# Patient Record
Sex: Female | Born: 1945 | Race: Black or African American | Hispanic: No | Marital: Married | Smoking: Former smoker
Health system: Southern US, Community
[De-identification: ages and names within clinical notes are randomized; demographics above are authoritative.]

## PROBLEM LIST (undated history)

## (undated) DIAGNOSIS — F17200 Nicotine dependence, unspecified, uncomplicated: Secondary | ICD-10-CM

## (undated) DIAGNOSIS — C801 Malignant (primary) neoplasm, unspecified: Secondary | ICD-10-CM

## (undated) DIAGNOSIS — J449 Chronic obstructive pulmonary disease, unspecified: Secondary | ICD-10-CM

## (undated) DIAGNOSIS — Z8669 Personal history of other diseases of the nervous system and sense organs: Secondary | ICD-10-CM

## (undated) DIAGNOSIS — F329 Major depressive disorder, single episode, unspecified: Secondary | ICD-10-CM

## (undated) DIAGNOSIS — R519 Headache, unspecified: Secondary | ICD-10-CM

## (undated) DIAGNOSIS — I714 Abdominal aortic aneurysm, without rupture, unspecified: Secondary | ICD-10-CM

## (undated) DIAGNOSIS — F32A Depression, unspecified: Secondary | ICD-10-CM

## (undated) DIAGNOSIS — I10 Essential (primary) hypertension: Secondary | ICD-10-CM

## (undated) DIAGNOSIS — G4730 Sleep apnea, unspecified: Secondary | ICD-10-CM

## (undated) HISTORY — DX: Nicotine dependence, unspecified, uncomplicated: F17.200

## (undated) HISTORY — PX: ABDOMINAL HYSTERECTOMY: SHX81

## (undated) HISTORY — DX: Abdominal aortic aneurysm, without rupture, unspecified: I71.40

## (undated) HISTORY — DX: Depression, unspecified: F32.A

## (undated) HISTORY — DX: Essential (primary) hypertension: I10

## (undated) HISTORY — DX: Personal history of other diseases of the nervous system and sense organs: Z86.69

## (undated) HISTORY — PX: CHOLECYSTECTOMY: SHX55

## (undated) HISTORY — DX: Abdominal aortic aneurysm, without rupture: I71.4

## (undated) HISTORY — DX: Major depressive disorder, single episode, unspecified: F32.9

## (undated) MED FILL — Dexamethasone Sodium Phosphate Inj 100 MG/10ML: INTRAMUSCULAR | Qty: 1 | Status: AC

---

## 1997-10-18 ENCOUNTER — Encounter: Admission: RE | Admit: 1997-10-18 | Discharge: 1997-10-18 | Payer: Self-pay | Admitting: Internal Medicine

## 1997-11-04 ENCOUNTER — Encounter: Admission: RE | Admit: 1997-11-04 | Discharge: 1997-11-04 | Payer: Self-pay | Admitting: Internal Medicine

## 1997-11-11 ENCOUNTER — Encounter: Admission: RE | Admit: 1997-11-11 | Discharge: 1997-11-11 | Payer: Self-pay | Admitting: Internal Medicine

## 1997-11-23 ENCOUNTER — Encounter: Admission: RE | Admit: 1997-11-23 | Discharge: 1997-11-23 | Payer: Self-pay | Admitting: Internal Medicine

## 1997-12-07 ENCOUNTER — Encounter: Admission: RE | Admit: 1997-12-07 | Discharge: 1997-12-07 | Payer: Self-pay | Admitting: Internal Medicine

## 1997-12-14 ENCOUNTER — Encounter: Admission: RE | Admit: 1997-12-14 | Discharge: 1997-12-14 | Payer: Self-pay | Admitting: Internal Medicine

## 1998-01-04 ENCOUNTER — Ambulatory Visit (HOSPITAL_COMMUNITY): Admission: RE | Admit: 1998-01-04 | Discharge: 1998-01-04 | Payer: Self-pay | Admitting: Internal Medicine

## 1998-02-24 ENCOUNTER — Encounter: Admission: RE | Admit: 1998-02-24 | Discharge: 1998-02-24 | Payer: Self-pay | Admitting: Internal Medicine

## 1998-03-30 ENCOUNTER — Encounter: Admission: RE | Admit: 1998-03-30 | Discharge: 1998-03-30 | Payer: Self-pay | Admitting: Internal Medicine

## 2000-04-21 ENCOUNTER — Emergency Department (HOSPITAL_COMMUNITY): Admission: EM | Admit: 2000-04-21 | Discharge: 2000-04-22 | Payer: Self-pay | Admitting: Emergency Medicine

## 2000-04-22 ENCOUNTER — Encounter: Payer: Self-pay | Admitting: Emergency Medicine

## 2000-05-20 ENCOUNTER — Encounter: Admission: RE | Admit: 2000-05-20 | Discharge: 2000-05-20 | Payer: Self-pay | Admitting: Internal Medicine

## 2000-05-27 ENCOUNTER — Ambulatory Visit (HOSPITAL_COMMUNITY): Admission: RE | Admit: 2000-05-27 | Discharge: 2000-05-27 | Payer: Self-pay | Admitting: Internal Medicine

## 2000-06-03 ENCOUNTER — Encounter: Admission: RE | Admit: 2000-06-03 | Discharge: 2000-06-03 | Payer: Self-pay | Admitting: Internal Medicine

## 2000-06-12 ENCOUNTER — Encounter: Admission: RE | Admit: 2000-06-12 | Discharge: 2000-07-02 | Payer: Self-pay | Admitting: Occupational Medicine

## 2001-01-28 ENCOUNTER — Encounter: Admission: RE | Admit: 2001-01-28 | Discharge: 2001-01-28 | Payer: Self-pay | Admitting: Internal Medicine

## 2001-03-03 ENCOUNTER — Encounter: Admission: RE | Admit: 2001-03-03 | Discharge: 2001-03-03 | Payer: Self-pay | Admitting: Internal Medicine

## 2001-08-12 ENCOUNTER — Encounter: Admission: RE | Admit: 2001-08-12 | Discharge: 2001-11-10 | Payer: Self-pay

## 2002-04-13 ENCOUNTER — Encounter: Admission: RE | Admit: 2002-04-13 | Discharge: 2002-04-13 | Payer: Self-pay | Admitting: Internal Medicine

## 2002-04-16 ENCOUNTER — Encounter: Payer: Self-pay | Admitting: Internal Medicine

## 2002-04-16 ENCOUNTER — Ambulatory Visit (HOSPITAL_COMMUNITY): Admission: RE | Admit: 2002-04-16 | Discharge: 2002-04-16 | Payer: Self-pay | Admitting: Internal Medicine

## 2002-04-23 ENCOUNTER — Encounter (INDEPENDENT_AMBULATORY_CARE_PROVIDER_SITE_OTHER): Payer: Self-pay | Admitting: Internal Medicine

## 2002-05-24 HISTORY — PX: ROTATOR CUFF REPAIR: SHX139

## 2002-08-03 ENCOUNTER — Encounter: Admission: RE | Admit: 2002-08-03 | Discharge: 2002-08-03 | Payer: Self-pay | Admitting: Internal Medicine

## 2003-02-11 ENCOUNTER — Encounter: Admission: RE | Admit: 2003-02-11 | Discharge: 2003-02-11 | Payer: Self-pay | Admitting: Internal Medicine

## 2003-09-14 ENCOUNTER — Ambulatory Visit (HOSPITAL_COMMUNITY): Admission: RE | Admit: 2003-09-14 | Discharge: 2003-09-14 | Payer: Self-pay | Admitting: Internal Medicine

## 2003-09-14 ENCOUNTER — Encounter: Admission: RE | Admit: 2003-09-14 | Discharge: 2003-09-14 | Payer: Self-pay | Admitting: Internal Medicine

## 2003-11-15 ENCOUNTER — Encounter: Admission: RE | Admit: 2003-11-15 | Discharge: 2003-11-15 | Payer: Self-pay | Admitting: Internal Medicine

## 2003-11-23 ENCOUNTER — Ambulatory Visit (HOSPITAL_COMMUNITY): Admission: RE | Admit: 2003-11-23 | Discharge: 2003-11-23 | Payer: Self-pay | Admitting: Internal Medicine

## 2004-08-27 ENCOUNTER — Emergency Department (HOSPITAL_COMMUNITY): Admission: EM | Admit: 2004-08-27 | Discharge: 2004-08-27 | Payer: Self-pay | Admitting: *Deleted

## 2004-08-30 ENCOUNTER — Emergency Department (HOSPITAL_COMMUNITY): Admission: EM | Admit: 2004-08-30 | Discharge: 2004-08-30 | Payer: Self-pay | Admitting: Emergency Medicine

## 2004-10-09 ENCOUNTER — Ambulatory Visit: Payer: Self-pay | Admitting: Internal Medicine

## 2004-10-22 ENCOUNTER — Ambulatory Visit: Payer: Self-pay | Admitting: Internal Medicine

## 2004-11-06 ENCOUNTER — Ambulatory Visit: Payer: Self-pay | Admitting: Internal Medicine

## 2004-12-11 ENCOUNTER — Ambulatory Visit: Payer: Self-pay | Admitting: Internal Medicine

## 2005-01-01 ENCOUNTER — Ambulatory Visit: Payer: Self-pay | Admitting: Internal Medicine

## 2005-01-31 ENCOUNTER — Ambulatory Visit: Payer: Self-pay | Admitting: Internal Medicine

## 2005-01-31 ENCOUNTER — Ambulatory Visit (HOSPITAL_COMMUNITY): Admission: RE | Admit: 2005-01-31 | Discharge: 2005-01-31 | Payer: Self-pay | Admitting: Internal Medicine

## 2005-03-04 ENCOUNTER — Emergency Department (HOSPITAL_COMMUNITY): Admission: EM | Admit: 2005-03-04 | Discharge: 2005-03-04 | Payer: Self-pay | Admitting: Emergency Medicine

## 2005-04-02 ENCOUNTER — Ambulatory Visit: Payer: Self-pay | Admitting: Internal Medicine

## 2005-10-08 ENCOUNTER — Ambulatory Visit (HOSPITAL_COMMUNITY): Admission: RE | Admit: 2005-10-08 | Discharge: 2005-10-08 | Payer: Self-pay | Admitting: Internal Medicine

## 2005-10-08 ENCOUNTER — Ambulatory Visit: Payer: Self-pay | Admitting: Internal Medicine

## 2005-11-29 ENCOUNTER — Ambulatory Visit: Payer: Self-pay | Admitting: Hospitalist

## 2006-02-19 ENCOUNTER — Ambulatory Visit (HOSPITAL_COMMUNITY): Admission: RE | Admit: 2006-02-19 | Discharge: 2006-02-19 | Payer: Self-pay | Admitting: Internal Medicine

## 2006-02-21 ENCOUNTER — Ambulatory Visit: Payer: Self-pay | Admitting: Internal Medicine

## 2006-02-21 LAB — CONVERTED CEMR LAB
BUN: 27 mg/dL — ABNORMAL HIGH (ref 6–23)
CO2: 24 meq/L (ref 19–32)
Calcium: 9.7 mg/dL (ref 8.4–10.5)
Chloride: 104 meq/L (ref 96–112)
Creatinine, Ser: 0.81 mg/dL (ref 0.40–1.20)
Glucose, Bld: 81 mg/dL (ref 70–99)
Magnesium: 1.9 mg/dL (ref 1.5–2.5)
Potassium: 4 meq/L (ref 3.5–5.3)
Sodium: 139 meq/L (ref 135–145)

## 2006-07-29 ENCOUNTER — Ambulatory Visit: Payer: Self-pay | Admitting: Internal Medicine

## 2006-07-29 ENCOUNTER — Encounter (INDEPENDENT_AMBULATORY_CARE_PROVIDER_SITE_OTHER): Payer: Self-pay | Admitting: Unknown Physician Specialty

## 2006-07-29 DIAGNOSIS — G5600 Carpal tunnel syndrome, unspecified upper limb: Secondary | ICD-10-CM | POA: Insufficient documentation

## 2006-07-29 DIAGNOSIS — E785 Hyperlipidemia, unspecified: Secondary | ICD-10-CM | POA: Insufficient documentation

## 2006-07-29 DIAGNOSIS — F329 Major depressive disorder, single episode, unspecified: Secondary | ICD-10-CM | POA: Insufficient documentation

## 2006-07-29 DIAGNOSIS — F17200 Nicotine dependence, unspecified, uncomplicated: Secondary | ICD-10-CM | POA: Insufficient documentation

## 2006-07-29 LAB — CONVERTED CEMR LAB
BUN: 23 mg/dL (ref 6–23)
CO2: 25 meq/L (ref 19–32)
Calcium: 9.7 mg/dL (ref 8.4–10.5)
Chloride: 104 meq/L (ref 96–112)
Creatinine, Ser: 0.86 mg/dL (ref 0.40–1.20)
Glucose, Bld: 82 mg/dL (ref 70–99)
Potassium: 3.7 meq/L (ref 3.5–5.3)
Sodium: 139 meq/L (ref 135–145)

## 2006-09-09 ENCOUNTER — Ambulatory Visit: Payer: Self-pay | Admitting: Internal Medicine

## 2006-09-09 DIAGNOSIS — M5412 Radiculopathy, cervical region: Secondary | ICD-10-CM | POA: Insufficient documentation

## 2006-09-09 DIAGNOSIS — Z9889 Other specified postprocedural states: Secondary | ICD-10-CM | POA: Insufficient documentation

## 2006-09-11 ENCOUNTER — Ambulatory Visit (HOSPITAL_COMMUNITY): Admission: RE | Admit: 2006-09-11 | Discharge: 2006-09-11 | Payer: Self-pay | Admitting: Internal Medicine

## 2006-09-25 ENCOUNTER — Encounter (INDEPENDENT_AMBULATORY_CARE_PROVIDER_SITE_OTHER): Payer: Self-pay | Admitting: Internal Medicine

## 2006-10-31 ENCOUNTER — Encounter (INDEPENDENT_AMBULATORY_CARE_PROVIDER_SITE_OTHER): Payer: Self-pay | Admitting: Internal Medicine

## 2006-12-16 ENCOUNTER — Encounter (INDEPENDENT_AMBULATORY_CARE_PROVIDER_SITE_OTHER): Payer: Self-pay | Admitting: Internal Medicine

## 2006-12-19 ENCOUNTER — Ambulatory Visit (HOSPITAL_COMMUNITY): Admission: RE | Admit: 2006-12-19 | Discharge: 2006-12-19 | Payer: Self-pay | Admitting: Neurosurgery

## 2006-12-20 ENCOUNTER — Emergency Department (HOSPITAL_COMMUNITY): Admission: EM | Admit: 2006-12-20 | Discharge: 2006-12-20 | Payer: Self-pay | Admitting: Emergency Medicine

## 2006-12-24 ENCOUNTER — Encounter (INDEPENDENT_AMBULATORY_CARE_PROVIDER_SITE_OTHER): Payer: Self-pay | Admitting: Internal Medicine

## 2007-01-02 ENCOUNTER — Inpatient Hospital Stay (HOSPITAL_COMMUNITY): Admission: RE | Admit: 2007-01-02 | Discharge: 2007-01-05 | Payer: Self-pay | Admitting: Neurosurgery

## 2007-02-17 ENCOUNTER — Ambulatory Visit: Payer: Self-pay | Admitting: Internal Medicine

## 2007-02-17 LAB — CONVERTED CEMR LAB: OCCULT 1: NEGATIVE

## 2007-02-18 LAB — CONVERTED CEMR LAB: OCCULT 3: NEGATIVE

## 2007-02-20 LAB — CONVERTED CEMR LAB: OCCULT 2: NEGATIVE

## 2007-02-23 ENCOUNTER — Ambulatory Visit: Payer: Self-pay | Admitting: Internal Medicine

## 2007-02-23 ENCOUNTER — Ambulatory Visit (HOSPITAL_COMMUNITY): Admission: RE | Admit: 2007-02-23 | Discharge: 2007-02-23 | Payer: Self-pay | Admitting: Internal Medicine

## 2007-03-12 ENCOUNTER — Encounter (INDEPENDENT_AMBULATORY_CARE_PROVIDER_SITE_OTHER): Payer: Self-pay | Admitting: Internal Medicine

## 2007-06-25 ENCOUNTER — Ambulatory Visit: Payer: Self-pay | Admitting: Internal Medicine

## 2007-06-25 DIAGNOSIS — K219 Gastro-esophageal reflux disease without esophagitis: Secondary | ICD-10-CM | POA: Insufficient documentation

## 2007-07-13 ENCOUNTER — Ambulatory Visit: Payer: Self-pay | Admitting: Internal Medicine

## 2007-07-13 ENCOUNTER — Telehealth: Payer: Self-pay | Admitting: *Deleted

## 2007-07-22 ENCOUNTER — Encounter (INDEPENDENT_AMBULATORY_CARE_PROVIDER_SITE_OTHER): Payer: Self-pay | Admitting: Internal Medicine

## 2008-02-25 ENCOUNTER — Ambulatory Visit (HOSPITAL_COMMUNITY): Admission: RE | Admit: 2008-02-25 | Discharge: 2008-02-25 | Payer: Self-pay | Admitting: Internal Medicine

## 2008-06-22 ENCOUNTER — Ambulatory Visit: Payer: Self-pay | Admitting: Internal Medicine

## 2008-06-22 ENCOUNTER — Encounter (INDEPENDENT_AMBULATORY_CARE_PROVIDER_SITE_OTHER): Payer: Self-pay | Admitting: Internal Medicine

## 2008-06-23 LAB — CONVERTED CEMR LAB
ALT: 10 units/L (ref 0–35)
AST: 13 units/L (ref 0–37)
Albumin: 4.3 g/dL (ref 3.5–5.2)
Alkaline Phosphatase: 81 units/L (ref 39–117)
BUN: 20 mg/dL (ref 6–23)
Basophils Absolute: 0 10*3/uL (ref 0.0–0.1)
Basophils Relative: 1 % (ref 0–1)
CO2: 24 meq/L (ref 19–32)
Calcium: 9.6 mg/dL (ref 8.4–10.5)
Chloride: 104 meq/L (ref 96–112)
Creatinine, Ser: 0.72 mg/dL (ref 0.40–1.20)
Eosinophils Absolute: 0.1 10*3/uL (ref 0.0–0.7)
Eosinophils Relative: 3 % (ref 0–5)
GFR calc Af Amer: >60 mL/min (ref >60)
GFR calc non Af Amer: >60 mL/min (ref >60)
Glucose, Bld: 79 mg/dL (ref 70–99)
HCT: 41.1 % (ref 36.0–46.0)
Hemoglobin: 13.8 g/dL (ref 12.0–15.0)
Lymphocytes Relative: 45 % (ref 12–46)
Lymphs Abs: 2.5 10*3/uL (ref 0.7–4.0)
MCHC: 33.6 g/dL (ref 30.0–36.0)
MCV: 92.6 fL (ref 78.0–100.0)
Monocytes Absolute: 0.4 10*3/uL (ref 0.1–1.0)
Monocytes Relative: 7 % (ref 3–12)
Neutro Abs: 2.6 10*3/uL (ref 1.7–7.7)
Neutrophils Relative %: 45 % (ref 43–77)
Platelets: 267 10*3/uL (ref 150–400)
Potassium: 3.8 meq/L (ref 3.5–5.3)
RBC: 4.44 M/uL (ref 3.87–5.11)
RDW: 14 % (ref 11.5–15.5)
Sodium: 140 meq/L (ref 135–145)
Total Bilirubin: 0.7 mg/dL (ref 0.3–1.2)
Total Protein: 7.5 g/dL (ref 6.0–8.3)
WBC: 5.7 10*3/uL (ref 4.0–10.5)

## 2008-10-10 ENCOUNTER — Encounter (INDEPENDENT_AMBULATORY_CARE_PROVIDER_SITE_OTHER): Payer: Self-pay | Admitting: Internal Medicine

## 2008-10-31 ENCOUNTER — Encounter (INDEPENDENT_AMBULATORY_CARE_PROVIDER_SITE_OTHER): Payer: Self-pay | Admitting: Internal Medicine

## 2008-11-04 ENCOUNTER — Emergency Department (HOSPITAL_COMMUNITY): Admission: EM | Admit: 2008-11-04 | Discharge: 2008-11-04 | Payer: Self-pay | Admitting: Family Medicine

## 2008-11-23 ENCOUNTER — Ambulatory Visit (HOSPITAL_COMMUNITY): Admission: RE | Admit: 2008-11-23 | Discharge: 2008-11-23 | Payer: Self-pay | Admitting: Internal Medicine

## 2008-11-23 ENCOUNTER — Ambulatory Visit: Payer: Self-pay | Admitting: Internal Medicine

## 2008-12-26 ENCOUNTER — Telehealth: Payer: Self-pay | Admitting: Internal Medicine

## 2009-02-13 ENCOUNTER — Encounter (INDEPENDENT_AMBULATORY_CARE_PROVIDER_SITE_OTHER): Payer: Self-pay | Admitting: Internal Medicine

## 2009-03-01 ENCOUNTER — Ambulatory Visit (HOSPITAL_COMMUNITY): Admission: RE | Admit: 2009-03-01 | Discharge: 2009-03-01 | Payer: Self-pay | Admitting: Internal Medicine

## 2009-03-01 LAB — HM MAMMOGRAPHY: HM Mammogram: NEGATIVE

## 2009-03-07 ENCOUNTER — Encounter (INDEPENDENT_AMBULATORY_CARE_PROVIDER_SITE_OTHER): Payer: Self-pay | Admitting: Internal Medicine

## 2009-05-24 ENCOUNTER — Ambulatory Visit: Payer: Self-pay | Admitting: Infectious Diseases

## 2009-05-24 ENCOUNTER — Ambulatory Visit (HOSPITAL_COMMUNITY): Admission: RE | Admit: 2009-05-24 | Discharge: 2009-05-24 | Payer: Self-pay | Admitting: Infectious Diseases

## 2009-05-24 LAB — CONVERTED CEMR LAB
BUN: 15 mg/dL (ref 6–23)
CO2: 26 meq/L (ref 19–32)
Calcium: 9.6 mg/dL (ref 8.4–10.5)
Chloride: 102 meq/L (ref 96–112)
Creatinine, Ser: 0.77 mg/dL (ref 0.40–1.20)
Glucose, Bld: 80 mg/dL (ref 70–99)
Potassium: 3.8 meq/L (ref 3.5–5.3)
Sodium: 138 meq/L (ref 135–145)

## 2009-07-03 ENCOUNTER — Ambulatory Visit: Payer: Self-pay | Admitting: Internal Medicine

## 2009-09-04 ENCOUNTER — Emergency Department (HOSPITAL_COMMUNITY): Admission: EM | Admit: 2009-09-04 | Discharge: 2009-09-04 | Payer: Self-pay | Admitting: Emergency Medicine

## 2010-01-17 ENCOUNTER — Ambulatory Visit: Payer: Self-pay | Admitting: Internal Medicine

## 2010-01-17 DIAGNOSIS — K5900 Constipation, unspecified: Secondary | ICD-10-CM | POA: Insufficient documentation

## 2010-01-18 DIAGNOSIS — I10 Essential (primary) hypertension: Secondary | ICD-10-CM | POA: Insufficient documentation

## 2010-01-18 LAB — CONVERTED CEMR LAB
ALT: 9 units/L (ref 0–35)
AST: 14 units/L (ref 0–37)
Albumin: 4.3 g/dL (ref 3.5–5.2)
Alkaline Phosphatase: 66 units/L (ref 39–117)
BUN: 17 mg/dL (ref 6–23)
CO2: 25 meq/L (ref 19–32)
Calcium: 9.4 mg/dL (ref 8.4–10.5)
Chloride: 104 meq/L (ref 96–112)
Cholesterol: 255 mg/dL — ABNORMAL HIGH (ref 0–200)
Creatinine, Ser: 0.75 mg/dL (ref 0.40–1.20)
Glucose, Bld: 83 mg/dL (ref 70–99)
HDL: 63 mg/dL (ref >39)
LDL Cholesterol: 174 mg/dL — ABNORMAL HIGH (ref 0–99)
Potassium: 3.8 meq/L (ref 3.5–5.3)
Sodium: 141 meq/L (ref 135–145)
TSH: 1.448 microintl units/mL (ref 0.350–4.5)
Total Bilirubin: 0.8 mg/dL (ref 0.3–1.2)
Total CHOL/HDL Ratio: 4
Total Protein: 7.1 g/dL (ref 6.0–8.3)
Triglycerides: 91 mg/dL (ref <150)
VLDL: 18 mg/dL (ref 0–40)

## 2010-03-02 ENCOUNTER — Ambulatory Visit (HOSPITAL_COMMUNITY)
Admission: RE | Admit: 2010-03-02 | Discharge: 2010-03-02 | Payer: Self-pay | Source: Home / Self Care | Attending: Internal Medicine | Admitting: Internal Medicine

## 2010-04-15 ENCOUNTER — Encounter: Payer: Self-pay | Admitting: Internal Medicine

## 2010-04-24 NOTE — Assessment & Plan Note (Signed)
Summary: EST-ROUTINE CHECKUP/CH   Vital Signs:  Patient profile:   65 year old female Height:      62 inches (157.48 cm) Weight:      159.3 pounds (72.41 kg) BMI:     29.24 Temp:     98.2 degrees F oral Pulse rate:   84 / minute BP sitting:   163 / 94  (right arm)  Vitals Entered By: Morrison Old RN (May 24, 2009 2:39 PM)  Serial Vital Signs/Assessments:  Time      Position  BP       Pulse  Resp  Temp     By 3:00 PM             152/97                         Morrison Old RN  CC: Check-up..Pain in left breast x 2 months., Depression Is Patient Diabetic? No Pain Assessment Patient in pain? yes     Location: left breast Intensity: 8 Type: aching Onset of pain  Intermittent Nutritional Status BMI of 25 - 29 = overweight  Have you ever been in a relationship where you felt threatened, hurt or afraid?No   Does patient need assistance? Functional Status Self care Ambulation Normal   Primary Care Provider:  Izora Gala Phifer  CC:  Check-up..Pain in left breast x 2 months. and Depression.  History of Present Illness: Debra Rodgers is a 65 yo F with PMH of HTN and cervical radiculopathy who presents for regularly scheduled follow-up. She says her L breast hurts at the chest wall, which hurts when she moves in certain positions and is similar in quality to the chest pain she had back in September 2010 which was diagnosed as MSK pain. She denies any nipple discharge, and her last mammogram was in 12/10 and was normal. She has that chest pain occasionally as well. She also reports cough x 2 months that is productive of clear sputum and is worst at night and early in the morning. Her nose is frequently "stopped up" and she reports occasional "night sweats" x 1 year (described mainly as "I get hot and then throw my covers off", denies being drenched in sweat).   Depression History:      The patient denies a depressed mood most of the day and a diminished interest in her usual daily  activities.        Comments:  "From time to time it sucks.".   Preventive Screening-Counseling & Management  Alcohol-Tobacco     Alcohol drinks/day: 0     Smoking Status: current     Smoking Cessation Counseling: yes     Packs/Day: 1/2     Year Started: about 30 yrs.  Caffeine-Diet-Exercise     Caffeine use/day: 2     Does Patient Exercise: no  Current Medications (verified): 1)  Aspir-Low 81 Mg Tbec (Aspirin) .... Take 1 Tablet By Mouth Once A Day 2)  Prozac 20 Mg Caps (Fluoxetine Hcl) .... Take 1 Tablet By Mouth Once A Day 3)  Dyazide 37.5-25 Mg Caps (Triamterene-Hctz) .... Take 1 Tablet By Mouth Once A Day 4)  Klor-Con 20 Meq Pack (Potassium Chloride) .... Take 1 Tablet By Mouth Once A Day 5)  Flexeril 5 Mg Tabs (Cyclobenzaprine Hcl) .... Take 1 Tablet Every 8 Hours As Needed For Muscle Pain 6)  Naprosyn 500 Mg Tabs (Naproxen) .... Take 1 Pill Twice Daily As Needed For Pain.  7)  Nasonex 50 Mcg/act Susp (Mometasone Furoate) .... 2 Sprays in Each Nostril Daily 8)  Nicotine 21 Mg/24hr Pt24 (Nicotine) .... Apply To Skin and Remove Every 24 Hours For 2 Weeks 9)  Nicotine 14 Mg/24hr Pt24 (Nicotine) .... Apply To Skin and Remove Every 24 Hours For 2 Weeks After You Have Finished The 2 Weeks of The 7 Mg Patches 10)  Nicotine 7 Mg/24hr Pt24 (Nicotine) .... Appy To Skin and Remove Every 24 Hours After You Finish The 2 Weeks of 14 Mg Patches. Continue Using This Dose For 8 Weeks, Then Stop.  Allergies (verified): 1)  ! Codeine Sulfate (Codeine Sulfate) 2)  ! Protonix (Pantoprazole Sodium)  Review of Systems      See HPI  Physical Exam  General:  Well-developed,well-nourished,in no acute distress; alert,appropriate and cooperative throughout examination Head:  Normocephalic and atraumatic without obvious abnormalities. No apparent alopecia or balding. Chest Wall:  tender to palpation at chest wall near L breast, reproduces pain Breasts:  no masses or discharge in L breast Lungs:   Normal respiratory effort, chest expands symmetrically. Lungs are clear to auscultation, no crackles or wheezes. Heart:  Normal rate and regular rhythm. S1 and S2 normal without gallop, murmur, click, rub or other extra sounds. Neurologic:  alert & oriented X3.   Psych:  Cognition and judgment appear intact. Alert and cooperative with normal attention span and concentration. No apparent delusions, illusions, hallucinations   Impression & Recommendations:  Problem # 1:  COUGH, CHRONIC (ICD-786.2) Her cough is likely the result of post-nasal drip given that she feels congested and her cough is worst at night and in the morning after she has been lying down. GERD is also possible, though her cough is productive. She has not lost any weight, which is reassuring. However, given her history of smoking, history of positive PPD, and her report of occasional "night sweats", I will check a chest x-ray. I have prescribed Nasonex and told the patient to take Claritin during the day which, if her cough is related to her nasal congestion, it will hopefully improve as a result. If it does not help, may need to try a trial of PPI.   Problem # 2:  CHEST WALL PAIN, HX OF (ICD-V15.89) Pain in chest wall near L breast is consistent with musculoskeletal etiology. I told the patient to take Aleve or naproxen scheduled twice daily x 10-14 days. I also wrote her a prescription for Flexeril. I expect her pain will improve if she takes the NSAID scheduled instead of only as needed for a short time.   Problem # 3:  Hx of TOBACCO ABUSE (ICD-305.1) Ms. Gilleland smokes 1/2 PPD but is interested in trying the patches, so I wrote her a prescription. Will follow-up with her in 1 month.  Her updated medication list for this problem includes:    Nicotine 21 Mg/24hr Pt24 (Nicotine) .Marland Kitchen... Apply to skin and remove every 24 hours for 2 weeks    Nicotine 14 Mg/24hr Pt24 (Nicotine) .Marland Kitchen... Apply to skin and remove every 24 hours for 2  weeks after you have finished the 2 weeks of the 7 mg patches    Nicotine 7 Mg/24hr Pt24 (Nicotine) .Marland Kitchen... Appy to skin and remove every 24 hours after you finish the 2 weeks of 14 mg patches. continue using this dose for 8 weeks, then stop.  Problem # 4:  Hx of ESSENTIAL HYPERTENSION (ICD-401.9) Pt's BP was elevated today (even repeat BP was 154/90) but all her previous  BP measurements were at goal. For now, I told her to restrict her salt intake and to keep a blood pressure log. She should take her BP every day and record it, and she should bring it in with her at her next visit. I will follow-up with her in 3-4 weeks.  Her updated medication list for this problem includes:    Dyazide 37.5-25 Mg Caps (Triamterene-hctz) .Marland Kitchen... Take 1 tablet by mouth once a day  Complete Medication List: 1)  Aspir-low 81 Mg Tbec (Aspirin) .... Take 1 tablet by mouth once a day 2)  Prozac 20 Mg Caps (Fluoxetine hcl) .... Take 1 tablet by mouth once a day 3)  Dyazide 37.5-25 Mg Caps (Triamterene-hctz) .... Take 1 tablet by mouth once a day 4)  Klor-con 20 Meq Pack (Potassium chloride) .... Take 1 tablet by mouth once a day 5)  Flexeril 5 Mg Tabs (Cyclobenzaprine hcl) .... Take 1 tablet every 8 hours as needed for muscle pain 6)  Naprosyn 500 Mg Tabs (Naproxen) .... Take 1 pill twice daily as needed for pain. 7)  Nasonex 50 Mcg/act Susp (Mometasone furoate) .... 2 sprays in each nostril daily 8)  Nicotine 21 Mg/24hr Pt24 (Nicotine) .... Apply to skin and remove every 24 hours for 2 weeks 9)  Nicotine 14 Mg/24hr Pt24 (Nicotine) .... Apply to skin and remove every 24 hours for 2 weeks after you have finished the 2 weeks of the 7 mg patches 10)  Nicotine 7 Mg/24hr Pt24 (Nicotine) .... Appy to skin and remove every 24 hours after you finish the 2 weeks of 14 mg patches. continue using this dose for 8 weeks, then stop.  Other Orders: T-Basic Metabolic Panel ([id number]) CXR- 2view (CXR)  Patient Instructions: 1)   Please schedule a follow-up appointment with Dr. Vinnie Langton in 3-4 weeks. 2)  For your chest pain, which I believe is muscular in origin, please take naproxen or Aleve twice daily for 10-14 days. Take it even if you are not having any pain. After this period, only take it or Tylenol as needed. 3)  I have also prescribed a medication called Flexeril, which helps with muscle spasms. Only take it as needed. It can be sedating, so don't drive or operate heavy machinery after taking it. 4)  For your cough, which I believe is the result of post-nasal drip, I have prescribed for you a spray that you spray twice into each nostril every day. I would also suggest taking Claritin during the day, but NOT Claritin D.  5)  I have also prescribed for you nicotine patches. Please use them as directed and call the clinic with any questions. Prescriptions: NICOTINE 7 MG/24HR PT24 (NICOTINE) appy to skin and remove every 24 hours after you finish the 2 weeks of 14 mg patches. Continue using this dose for 8 weeks, then stop.  #14 patches x 0   Entered and Authorized by:   Neta Mends MD   Signed by:   Neta Mends MD on 05/24/2009   Method used:   [facility] to        [facility] [address] [phone]* (retail)       [address]       [place],   [address]       Ph: [phone] or [phone]       Fax: [phone]   RxID:   [id number] NICOTINE 14 MG/24HR PT24 (NICOTINE) apply to skin and remove every 24  hours for 2 weeks after you have finished the 2 weeks of the 7 mg patches  #14 patches x 0   Entered and Authorized by:   Neta Mends MD   Signed by:   Neta Mends MD on 05/24/2009   Method used:   [facility]ron[facility]        [facility] [address] [phone]* (retail)       [address]       [place], [place]  [address]       Ph: [phone] or [phone]       Fax: [phone]   RxID:   [id number] NICOTINE 21 MG/24HR PT24 (NICOTINE) apply to  skin and remove every 24 hours for 2 weeks  #14 patches x 3   Entered and Authorized by:   Neta Mends MD   Signed by:   Neta Mends MD on 05/24/2009   Method used:   [facility]ronically to        [facility] [address] [phone]* (retail)       [address]       [place], [place]  [address]       Ph: [phone] or [phone]       Fax: [phone]   RxID:   [id number] NASONEX 50 MCG/ACT SUSP (MOMETASONE FUROATE) 2 sprays in each nostril daily  #1 mo supply x 2   Entered and Authorized by:   Neta Mends MD   Signed by:   Neta Mends MD on 05/24/2009   Method used:   Electronically to        [facility] [address] [phone]* (retail)       [address]       [place], [place]  [address]       Ph: [phone] or [phone]       Fax: [phone]   RxID:   [id number] FLEXERIL 5 MG TABS (CYCLOBENZAPRINE HCL) take 1 tablet every 8 hours as needed for muscle pain  #30 x 0   Entered and Authorized by:   Neta Mends MD   Signed by:   Neta Mends MD on 05/24/2009   Method used:   Electronically to        [facility] [address] [phone]* (retail)       [address]       [place], [place]  [address]       Ph: [phone] or [phone]       Fax: [phone]   RxID:   [id number] DYAZIDE 37.5-25 MG CAPS (TRIAMTERENE-HCTZ) Take 1 tablet by mouth once a day  #30 Capsule x 6   Entered and Authorized by:   Neta Mends MD   Signed by:   Neta Mends MD on 05/24/2009   Method used:   Electronically to        [facility] [address] [phone]* (retail)       [address]       [place], [place]  [address]       Ph: [phone] or [phone]       Fax: [phone]   RxID:   [id number] PROZAC 20 MG CAPS (FLUOXETINE HCL) Take 1 tablet by mouth once a day  #30 Capsule x 6   Entered and Authorized by:  Neta Mends MD   Signed by:   Neta Mends MD on 05/24/2009   Method  used:   Electronically to        [facility] [address] [phone]* (retail)       [address]       [place], [place]  [address]       Ph: [phone] or [phone]       Fax: [phone]   RxID:   [id number]   Prevention & Chronic Care Immunizations   Influenza vaccine: Not documented    Tetanus booster: Not documented    Pneumococcal vaccine: Not documented    H. zoster vaccine: Not documented  Colorectal Screening   Hemoccult: Not documented    Colonoscopy: Results: Polyps - 2 tubulovillous adenomas, 4 tubular adenomas, no high grade dysplasia (total 6 polyps).  Results: Hemorrhoids.  Results: Diverticulosis.   Location:  [facility]).     (10/17/2008)   Colonoscopy action/deferral: Repeat colonoscopy in 2 years.    (10/17/2008)   Colonoscopy due: 10/2010  Other Screening   Pap smear: Not documented    Mammogram: normal  (03/07/2009)   Mammogram due: 02/2010    DXA bone density scan: Not documented   Smoking status: current  (05/24/2009)   Smoking cessation counseling: yes  (05/24/2009)  Lipids   Total Cholesterol: Not documented   LDL: Not documented   LDL Direct: Not documented   HDL: Not documented   Triglycerides: Not documented    SGOT (AST): 13  (06/22/2008)   SGPT (ALT): 10  (06/22/2008)   Alkaline phosphatase: 81  (06/22/2008)   Total bilirubin: 0.7  (06/22/2008)  Hypertension   Last Blood Pressure: 163 / 94  (05/24/2009)   Serum creatinine: 0.72  (06/22/2008)   Serum potassium 3.8  (06/22/2008)  Self-Management Support :   Personal Goals (by the next clinic visit) :      Personal blood pressure goal: 140/90  (05/24/2009)     Personal LDL goal: 100  (05/24/2009)    Patient will work on the following items until the next clinic visit to reach self-care goals:     Medications and monitoring: check my blood sugar, bring all of my medications to every visit  (05/24/2009)     Eating: eat more  vegetables, use fresh or frozen vegetables, eat foods that are low in salt, eat baked foods instead of fried foods  (05/24/2009)     Activity: take a 30 minute walk every day  (05/24/2009)    Hypertension self-management support: Education handout, Resources for patients handout, Written self-care plan  (05/24/2009)   Hypertension self-care plan printed.   Hypertension education handout printed    Lipid self-management support: Education handout, Resources for patients handout, Written self-care plan  (05/24/2009)   Lipid self-care plan printed.   Lipid education handout printed      Resource handout printed.  Process Orders Check Orders Results:     [facility]: MGN not required for this insurance Tests Sent for requisitioning (May 24, 2009 3:48 PM):     05/24/2009: [facility] -- T-Basic Metabolic Panel [[id number]] (signed)

## 2010-04-24 NOTE — Consult Note (Signed)
Summary: [facility]: Dr[facility]: Dr. Joya Salm   Imported By: Bonner Puna 01/01/2007 11:42:34  _____________________________________________________________________  External Attachment:    Type:   Image     Comment:   External Document

## 2010-04-24 NOTE — Progress Notes (Signed)
Summary: Refill/gh  Phone Note Refill Request Message from:  Fax from Pharmacy on December 26, 2008 9:36 AM  Refills Requested: Medication #1:  DYAZIDE 37.5-25 MG CAPS Take 1 tablet by mouth once a day   Last Refilled: 11/20/2008  Medication #2:  PROZAC 20 MG CAPS Take 1 tablet by mouth once a day   Last Refilled: 11/20/2008  Method Requested: Electronic Initial call taken by: Sander Nephew RN,  December 26, 2008 9:37 AM  Follow-up for Phone Call        Refilled electronically.  Follow-up by: Bertha Stakes MD,  December 26, 2008 10:17 AM    Prescriptions: PROZAC 20 MG CAPS (FLUOXETINE HCL) Take 1 tablet by mouth once a day  #30 Capsule x 3   Entered and Authorized by:   Bertha Stakes MD   Signed by:   Bertha Stakes MD on 12/26/2008   Method used:   [facility]ron[facility] to        [facility] [address] [phone]* (retail)       [address]       [place], [place]  [address]       Ph: [phone] or [phone]       Fax: [phone]   RxID:   [id number] DYAZIDE 37.5-25 MG CAPS (TRIAMTERENE-HCTZ) Take 1 tablet by mouth once a day  #30 Capsule x 3   Entered and Authorized by:   Bertha Stakes MD   Signed by:   Bertha Stakes MD on 12/26/2008   Method used:   Electronically to        [facility] [address] [phone]* (retail)       [address]       [place], [place]  [address]       Ph: [phone] or [phone]       Fax: [phone]   RxID:   [id number]

## 2010-04-24 NOTE — Miscellaneous (Signed)
  Clinical Lists Changes  Observations: Added new observation of MAMMO DUE: 02/2008 (07/22/2007 14:52) Added new observation of MAMMOGRAM: Assessment: BIRADS 1.  (02/23/2007 14:53)       Mammogram  Procedure date:  02/23/2007  Findings:      Assessment: BIRADS 1.   Procedures Next Due Date:    Mammogram: 02/2008

## 2010-04-24 NOTE — Assessment & Plan Note (Signed)
Summary: ACUTE-PAIN ON LEFT SIDE TICK BITE NOT FEELING RIGHT/(PHIFER)/CFB   Vital Signs:  Patient Profile:   65 Years Old Female Weight:      160.6 pounds (73.00 kg) Temp:     98.8 degrees F (37.11 degrees C) oral Pulse rate:   72 / minute BP sitting:   148 / 88  (right arm)  Pt. in pain?   yes    Location:   left side flack    Intensity:   8  Vitals Entered By: Nadine Counts Deborra Medina) (Jul 29, 2006 2:45 PM)              Is Patient Diabetic? No Nutritional Status Obese  Have you ever been in a relationship where you felt threatened, hurt or afraid?No   Does patient need assistance? Functional Status Self care Ambulation Normal   PCP:  Izora Gala Phifer  Chief Complaint:  pt c/o left side body pain x7mh, had tick removed one week ago, hand, legs, and and feet tingling.  History of Present Illness: Pt is a pt of Dr. PErmalinda Memoswith h/o HTN, HL(no treatment), ongoing tobacco use and ongoing c/o ingling and numbness in her extrimities, more so in Upper vs Lower. She says it has gotten worse over the last mth. She says  she burns her fingers occ from holding very hot things and does not realize it, more pronounced on lt side. She also has been trouble holding things, drops forks and spoons, button/unbuttoning her clothes due to lack of sensation. She has not noticed any acute focal weakness involving her extrimities like falling down, inability to lift hands above shoulder, HA, Vn changes, palpitation, CP. She does c/o Lt shoulder pain and occ difficulty moving that arm. She has had a Rotator cuff tear repair on that side in 2004 by Dr. SOnnie Graham She says that she is doing well on Prazac in terms of depression. She c/o pain in hr lt breast for 1 mth, dull aching, no change since onset, not noticed any mass. She has been stressed out because of the ambiguity and would like to figure out whats going on.  Current Allergies (reviewed today): ! CODEINE SULFATE (CODEINE SULFATE)  Past Medical  History:    F/H/O Breast Ca in sister at age of 450s    Depression    H/O + PPD in 6/94, neg CXR in 1994, 1997 and 2005    H/O Migraine  Past Surgical History:    Cholecystectomy    Hysterectomy 9/93 for Fibroids    Rotator cuff repair - 3/04, Dr. SOnnie Graham      Family History:    Sisters - DM, HTN    Father - CHF    Mother - HTN, CVA and CAD in 655s Social History:    Occupation:CNA    Married    Current Smoker - 1/2 ppd for 30+yrs    Alcohol use-no    Regular exercise-yes   Risk Factors:  Tobacco use:  current    Cigarettes:  Yes -- 1/2 pack(s) per day    Counseled to quit/cut down tobacco use:  yes Alcohol use:  no Exercise:  yes    Physical Exam  General:     Well-developed,well-nourished,in no acute distress; alert,appropriate and cooperative throughout examination Head:     Normocephalic and atraumatic without obvious abnormalities. No apparent alopecia or balding. Eyes:     No corneal or conjunctival inflammation noted. EOMI. Perrla. Funduscopic exam benign, without hemorrhages, exudates or papilledema. Vision  grossly normal. Mouth:     Oral mucosa and oropharynx without lesions or exudates.  Teeth in good repair. Neck:     No deformities, masses, or tenderness noted. Breasts:     Lt larger than Rt, no lumps or cysts palpated. sl tender lt side. No discharge through nipple Lungs:     Normal respiratory effort, chest expands symmetrically. Lungs are clear to auscultation, no crackles or wheezes. Heart:     Normal rate and regular rhythm. S1 and S2 normal without gallop, murmur, click, rub or other extra sounds. Abdomen:     Bowel sounds positive,abdomen soft and non-tender without masses, organomegaly or hernias noted. Msk:     Abduction restricted Lt side beyond 150 degrees, rest normal. Tinnels and Phalen's sign positive on Lt Pulses:     R and L carotid,radial,femoral,dorsalis pedis and posterior tibial pulses are full and equal bilaterally  Neurologic:     Non focal See above    Impression & Recommendations:  Problem # 1:  Hx of SYNDROME, CARPAL TUNNEL (ICD-354.0) Pt's symptoms in her hands most likely are sec to Carpal tunnel syndrome. She has had a w/u for peripheral neuropathy in terms of HIV, TSH, RPR and Vit B12 level and also rpt BMETs, which have all been neg to support the cause. Treating what we have, I rec that she get a release atleast on Lt side which has most dominant symptoms and if she feels better following the surgery she could consider for rt side. She agrees to above, but would like to discuss with Dr. Ermalinda Memos, her PCP. Also meanwhile she will ponder about the Orthopedist of her choice.I will give her Darvocet for pain till she sees Dr. Ermalinda Memos and come up with a plan.    Problem # 2:  Hx of ESSENTIAL HYPERTENSION (ICD-401.9) Looking back at her recs, it seems it has been well controlled on the following med. today is the first time when it is elevated to 150/90 (manually). Since she is going to be back to see Dr. Ermalinda Memos soon, I feel comfortable rechecking it and then adding any new medication to her regimen. Since she is on Potassium supplements, I shall check a BMET today Her updated medication list for this problem includes:    Dyazide 37.5-25 Mg Caps (Triamterene-hctz) .Marland Kitchen... Take 1 tablet by mouth once a day  Orders: T-Basic Metabolic Panel ([id number])  ADD: BMET normal.   Problem # 3:  Hx of DISORDER, DEPRESSIVE NEC (ICD-311) She seems a little tired to me. But says has been stressed out because of all ongoing probs. Again this could be adding to her elevated BP. Her updated medication list for this problem includes:    Prozac 20 Mg Caps (Fluoxetine hcl) .Marland Kitchen... 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 She had a normal Mammogram in 11/07. She does have a family h/o Breast cancer, but nothing at this time warrants another imaging study or  FNAC. Have asked her to use heating pads.  Problem # 5:  Hx of TOBACCO ABUSE (ICD-305.1) Pt would like to quit smoking, but lost hte script for Chantix from Dr. Ermalinda Memos. Will go ahead and give her one. Her updated medication list for this problem includes:    Chantix Starting Month Pak 0.5 Mg X 11 & 1 Mg X 42 Misc (Varenicline tartrate) .Marland Kitchen... Take as advised.   Medications Added to Medication List This Visit: 1)  Aspir-low 81 Mg Tbec (Aspirin) .... Take 1 tablet by mouth once a day 2)  Prozac 20 Mg Caps (Fluoxetine hcl) .... Take 1 tablet by mouth once a day 3)  Dyazide 37.5-25 Mg Caps (Triamterene-hctz) .... Take 1 tablet by mouth once a day 4)  Klor-con 20 Meq Pack (Potassium chloride) .... Take 1 tablet by mouth once a day 5)  Darvocet-n 100 100-650 Mg Tabs (Propoxyphene n-apap) .... Take one tab every 6 hrs as required 6)  Chantix Starting Month Pak 0.5 Mg X 11 & 1 Mg X 42 Misc (Varenicline tartrate) .... Take as advised.   Patient Instructions: 1)  F/U apt with Dr. Ermalinda Memos, the earliest available. 2)  Limit your Sodium (Salt). 3)  Tobacco is very bad for your health and your loved ones! You Should stop smoking!. Prescriptions: DARVOCET-N 100 100-650 MG TABS (PROPOXYPHENE N-APAP) take one tab every 6 hrs as required  #20 x 1   Entered and Authorized by:   Caprice Kluver MD   Signed by:   Caprice Kluver MD on 07/29/2006   Method used:   Print then Give to Patient   RxID:   [id number]

## 2010-04-24 NOTE — Progress Notes (Signed)
Summary: appt for med reaction/ hla  Phone Note Call from Patient   Reason for Call: Acute Illness Summary of Call: pt called to c/o poss reaction to protonix...facial swelling and rash.. told her to see dr asap Initial call taken by: Freddy Finner RN,  July 13, 2007 2:48 PM

## 2010-04-24 NOTE — Consult Note (Signed)
Summary: [facility]:Dr. Bob[facility]:Dr. Joya Salm   Imported By: Bonner Puna 07/27/2007 15:40:17  _____________________________________________________________________  External Attachment:    Type:   Image     Comment:   External Document

## 2010-04-24 NOTE — Consult Note (Signed)
Summary: [facility]   Imported By: Bonner Puna 03/16/2009 14:04:31  _____________________________________________________________________  External Attachment:    Type:   Image     Comment:   External Document

## 2010-04-24 NOTE — Miscellaneous (Signed)
  Clinical Lists Changes  Observations: Added new observation of COLONNXTDUE: 10/2010 (10/31/2008 9:37) Added new observation of COLONRECACT: Repeat colonoscopy in 2 years.   (10/17/2008 9:42) Added new observation of COLONOSCOPY: Results: Polyps - 2 tubulovillous adenomas, 4 tubular adenomas, no high grade dysplasia (total 6 polyps).  Results: Hemorrhoids.  Results: Diverticulosis.   Location:  [facility]).    (10/17/2008 9:42)      Colonoscopy  Procedure date:  10/17/2008  Findings:      Results: Polyps - 2 tubulovillous adenomas, 4 tubular adenomas, no high grade dysplasia (total 6 polyps).  Results: Hemorrhoids.  Results: Diverticulosis.   Location:  [facility]).     Comments:      Repeat colonoscopy in 2 years.    Procedures Next Due Date:    Colonoscopy: 10/2010   Colonoscopy  Procedure date:  10/17/2008  Findings:      Results: Polyps - 2 tubulovillous adenomas, 4 tubular adenomas, no high grade dysplasia (total 6 polyps).  Results: Hemorrhoids.  Results: Diverticulosis.   Location:  [facility]).     Comments:      Repeat colonoscopy in 2 years.    Procedures Next Due Date:    Colonoscopy: 10/2010

## 2010-04-24 NOTE — Assessment & Plan Note (Signed)
Summary: Debra Rodgers   Vital Signs:  Patient profile:   65 year old female Height:      62 inches (157.48 cm) Weight:      164.01 pounds (74.55 kg) BMI:     30.11 Temp:     97.7 degrees F (36.50 degrees C) oral Pulse rate:   73 / minute BP sitting:   147 / 90  (left arm)  Vitals Entered By: Sander Nephew RN (June 22, 2008 9:31 AM) Is Patient Diabetic? No Pain Assessment Patient in pain? yes     Location: back Intensity: 8 Type: aching Onset of pain  Intermittent Nutritional Status BMI of > 30 = obese  Have you ever been in a relationship where you felt threatened, hurt or afraid?No   Does patient need assistance? Functional Status Self care Ambulation Normal Comments Check up.  needs refills on med.   Primary Care Provider:  Izora Gala Phifer   History of Present Illness: This is a  65  y/o woman with PMH of   F/H/O Breast Ca in sister at age of 6s. Depression H/O + PPD in 6/94, neg CXR in 1994, 1997 and 2005 H/O Migraine  presenting for "not feeling well".  She has some pain in her left side of the back, left shoulder and leg. It started  ~ 1 nmo ago. It is an 8-9/10. Now, since she sits down, is not bothering her. No h/o lifting heavy objects or hurting her back. She had a cervical spine Sx in 2008 with Dr. Joya Salm. She also has HAs that wake her up from sleep. She had them in the past, but had one last week.  She needs refills of her meds. She was out of the BP med today, but she took it before. She was not taking the potassium for >1 mo.  Had a hysterectomy. Had a mammogram 02/2008.  She has [employer]. Can refer to a colonoscopy.    Depression History:      The patient denies a depressed mood most of the day and a diminished interest in her usual daily activities.         Preventive Screening-Counseling & Management     Smoking Status: current     Smoking Cessation Counseling: yes     Packs/Day: 1/2     Caffeine  use/day: 2     Does Patient Exercise: no  Current Medications (verified): 1)  Aspir-Low 81 Mg Tbec (Aspirin) .... Take 1 Tablet By Mouth Once A Day 2)  Prozac 20 Mg Caps (Fluoxetine Hcl) .... Take 1 Tablet By Mouth Once A Day 3)  Dyazide 37.5-25 Mg Caps (Triamterene-Hctz) .... Take 1 Tablet By Mouth Once A Day 4)  Klor-Con 20 Meq Pack (Potassium Chloride) .... Take 1 Tablet By Mouth Once A Day  Allergies: 1)  ! Codeine Sulfate (Codeine Sulfate) 2)  ! Protonix (Pantoprazole Sodium)  Review of Systems       No N/V/D/C/CP/SOB  Physical Exam  General:  alert and well-developed.   Head:  normocephalic, atraumatic, and no abnormalities palpated.   Eyes:  vision grossly intact, pupils equal, pupils round, and pupils reactive to light.   Mouth:  pharynx pink and moist.   Lungs:  normal respiratory effort, no crackles, and no wheezes.   Heart:  normal rate, regular rhythm, no murmur, and no gallop.   Msk:  Pain at percussion of cervical spine.  Extremities:  no edema Neurologic:  alert & oriented X3, cranial  nerves II-XII intact, strength normal in all extremities, gait normal, and LLE hyperreflexia.  Sensation increased on left side of body (LUE and LLE).    Impression & Recommendations:  Problem # 1:  Hx of ESSENTIAL HYPERTENSION (ICD-401.9) Assessment Deteriorated BP a little high, but pt out of the BP med today. Will refill. She did not take K for "a long time">>will check labs, and, if K low, will call her to start K again. Call in 3 months for BP check.  Her updated medication list for this problem includes:    Dyazide 37.5-25 Mg Caps (Triamterene-hctz) .Marland Kitchen... Take 1 tablet by mouth once a day  Orders: T-Comprehensive Metabolic Panel ([id number])  BP today: 147/90 Prior BP: 110/70 (07/13/2007)  Labs Reviewed: K+: 3.7 (07/29/2006) Creat: : 0.86 (07/29/2006)     Problem # 2:  Hx of HYPERLIPIDEMIA (ICD-272.4) Assessment: Comment Only Agrees for cholesterol  check but  already ate today - will check at next visit. She does not want cholesterol meds (esp. statins) but agrees for Fish/Flax oil.  Future Orders: T-Lipid Profile [phone]) ... 09/21/2008  Problem # 3:  SPECIAL SCREENING FOR MALIGNANT NEOPLASMS COLON (ICD-V76.51) Assessment: Comment Only Pt up to date with all screening, except colonoscopy - she has insurance ([employer]) - will refer for [employer].  Orders: T-CBC No Diff ([id number]) Gastroenterology Referral (GI)  Problem # 4:  CERVICAL RADICULOPATHY, LEFT (ICD-723.4) Assessment: Deteriorated Pt with pain in cervical spine, with HAs, hypersensitivity LUE and LLE, and hyperreflexia LLE. She is s/p C3-4, C4-5 fusion in 12/2006. I advised her for back mm strengthening exercises, but also to schedule an appt with Dr. Joya Salm for f/u after her Sx.  Of note, she has a h/o rotator cuff sd on left, too.   Complete Medication List: 1)  Aspir-low 81 Mg Tbec (Aspirin) .... Take 1 tablet by mouth once a day 2)  Prozac 20 Mg Caps (Fluoxetine hcl) .... Take 1 tablet by mouth once a day 3)  Dyazide 37.5-25 Mg Caps (Triamterene-hctz) .... Take 1 tablet by mouth once a day 4)  Klor-con 20 Meq Pack (Potassium chloride) .... Take 1 tablet by mouth once a day  Patient Instructions: 1)  Please schedule a follow-up appointment in 3 months for blood pressure check. 2)  Please come on an empty stomach (if appointment set in am) for cholesterol check.. Prescriptions: DYAZIDE 37.5-25 MG CAPS (TRIAMTERENE-HCTZ) Take 1 tablet by mouth once a day  #30 Capsule x 5   Entered and Authorized by:   Philemon Kingdom MD   Signed by:   Philemon Kingdom MD on 06/22/2008   Method used:   Electronically to        [facility] [phone]* (retail)       [address]       [place], [place]  [address]       Ph: [phone] or [phone]       Fax: [phone]   RxID:   [id number] PROZAC 20 MG CAPS (FLUOXETINE HCL) Take 1 tablet by  mouth once a day  #30 Capsule x 5   Entered and Authorized by:   Philemon Kingdom MD   Signed by:   Philemon Kingdom MD on 06/22/2008   Method used:   [facility] to        [facility] [phone]* (retail)       [address]       [place]  [place], [place]  [address]       Ph: [phone] or [phone]       Fax: [phone]   RxID:   [id number]

## 2010-04-24 NOTE — Assessment & Plan Note (Signed)
Summary: STOMACH/SB.    Primary Care Provider:  Trish Fountain MD   History of Present Illness: 65 yo female with PMH outlined below presents to [place] with main concern of generalized abdominal pain that has been going on for several months but has gotten worse over the past few weeks. She describes it as sharp an dsometimes dull, 5/10 in severity and intermittent, nonradiating, no specific alleviating or aggrevating factors. Her diet has changes in the past few weeks, she is mostly eating meat  (chicken, pork and [place]), sometimes fried and sometimes baked, no vegetables and no fruits, she drink only 1-2 cups of water daily since she is very busy at work. 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 She denies any fever, chills, no urinary symptoms, no blood in urine or stool. She tells me she has had colonoscopy done 1 year ago at the [facility] and was WNL. She is taking BP and depression medicine and she is compliant with both of them. She continues to smoke 1/2 pack per day but denies alcohol or other ilicit drug use. She denies any sick contacts or exposure, no history of malignancy (except family h/o breast cancer), no heat or cold intolerance, no dry mouth or eyes, no arthralgias or myalgias.   Preventive Screening-Counseling & Management  Alcohol-Tobacco     Alcohol drinks/day: 0     Smoking Status: current     Smoking Cessation Counseling: yes     Smoke Cessation Stage: contemplative     Packs/Day: 0.5  Problems Prior to Update: 1)  Sinusitis  (ICD-473.9) 2)  Cough, Chronic  (ICD-786.2) 3)  Chest Wall Pain, Hx of  (ICD-V15.89) 4)  Hx of Hyperlipidemia  (ICD-272.4) 5)  Gerd  (ICD-530.81) 6)  Special Screening For Malignant Neoplasms Colon  (ICD-V76.51) 7)  Family History Breast Cancer 1st Degree Relative <50  (ICD-V16.3) 8)  Neoplasm, Malignant, Breast, Family Hx, Sibling  (ICD-V16.3) 9)  Tb Skin Test, Positive, Hx of  (ICD-795.5) 10)   Cervical Radiculopathy, Left  (ICD-723.4) 11)  Rotator Cuff Repair, Left, Hx of  (ICD-V45.89) 12)  Hx of Tobacco Abuse  (ICD-305.1) 13)  Hx of Mastalgia  (ICD-611.71) 14)  Hx of Disorder, Depressive Nec  (ICD-311) 15)  Hx of Syndrome, Carpal Tunnel  (ICD-354.0) 16)  Hx of Essential Hypertension  (ICD-401.9) 17)  Adverse Drug Reaction, Late Effect  (ICD-909.5)  Medications Prior to Update: 1)  Aspir-Low 81 Mg Tbec (Aspirin) .... Take 1 Tablet By Mouth Once A Day 2)  Prozac 20 Mg Caps (Fluoxetine Hcl) .... Take 1 Tablet By Mouth Once A Day 3)  Dyazide 37.5-25 Mg Caps (Triamterene-Hctz) .... Take 1 Tablet By Mouth Once A Day 4)  Klor-Con 20 Meq Pack (Potassium Chloride) .... Take 1 Tablet By Mouth Once A Day 5)  Flexeril 5 Mg Tabs (Cyclobenzaprine Hcl) .... Take 1 Tablet Every 8 Hours As Needed For Muscle Pain 6)  Naprosyn 500 Mg Tabs (Naproxen) .... Take 1 Pill Twice Daily As Needed For Pain. 7)  Nasonex 50 Mcg/act Susp (Mometasone Furoate) .... 2 Sprays in Each Nostril Daily 8)  Nicotine 21 Mg/24hr Pt24 (Nicotine) .... Apply To Skin and Remove Every 24 Hours For 2 Weeks 9)  Nicotine 14 Mg/24hr Pt24 (Nicotine) .... Apply To Skin and Remove Every 24 Hours For 2 Weeks After You Have Finished The 2 Weeks of The 7 Mg Patches 10)  Nicotine 7 Mg/24hr Pt24 (Nicotine) .... Appy  To Skin and Remove Every 24 Hours After You Finish The 2 Weeks of 14 Mg Patches. Continue Using This Dose For 8 Weeks, Then Stop.  Current Medications (verified): 1)  Aspir-Low 81 Mg Tbec (Aspirin) .... Take 1 Tablet By Mouth Once A Day 2)  Prozac 20 Mg Caps (Fluoxetine Hcl) .... Take 1 Tablet By Mouth Once A Day 3)  Dyazide 37.5-25 Mg Caps (Triamterene-Hctz) .... Take 1 Tablet By Mouth Once A Day 4)  Klor-Con 20 Meq Pack (Potassium Chloride) .... Take 1 Tablet By Mouth Once A Day  Allergies (verified): 1)  ! Codeine Sulfate (Codeine Sulfate) 2)  ! Protonix (Pantoprazole Sodium)  Past History:  Past Medical  History: Last updated: 07/29/2006 F/H/O Breast Ca in sister at age of 58s. Depression H/O + PPD in 6/94, neg CXR in 1994, 1997 and 2005 H/O Migraine  Past Surgical History: Last updated: 07/29/2006 Cholecystectomy Hysterectomy 9/93 for Fibroids Rotator cuff repair - 3/04, Dr. Onnie Graham  Family History: Last updated: 02/17/2007 Sisters - DM, HTN Father - CHF Mother - HTN, CVA and CAD in 63s Family History Breast cancer 1st degree relative <50 - Sister age 53  Social History: Last updated: 07/29/2006 Occupation:CNA Married Current Smoker - 1/2 ppd for 30+yrs Alcohol use-no Regular exercise-yes  Risk Factors: Alcohol Use: 0 (01/17/2010) Caffeine Use: 2 (05/24/2009) Exercise: no (05/24/2009)  Risk Factors: Smoking Status: current (01/17/2010) Packs/Day: 0.5 (01/17/2010)  Family History: Reviewed history from 02/17/2007 and no changes required. Sisters - DM, HTN Father - CHF Mother - HTN, CVA and CAD in 28s Family History Breast cancer 1st degree relative <50 - Sister age 13  Social History: Reviewed history from 07/29/2006 and no changes required. Occupation:CNA Married Current Smoker - 1/2 ppd for 30+yrs Alcohol use-no Regular exercise-yes Packs/Day:  0.5  Review of Systems       per HPI  Physical Exam  General:  Well-developed,well-nourished,in no acute distress; alert,appropriate and cooperative throughout examination Mouth:  Oral mucosa and oropharynx without erythema, lesions, or exudates, overgrowth of tissue on soft palate (pt says it has been there for 2 years) Lungs:  Normal respiratory effort, chest expands symmetrically. Lungs are clear to auscultation, no crackles or wheezes. Heart:  Normal rate and regular rhythm. S1 and S2 normal without gallop, murmur, click, rub or other extra sounds. Abdomen:  bowel sounds hyperactive, no tenderness to superficial palpation, tenderness to deep palpation in the epigastric area and left quadrants>right quadrant,  no guarding, no massess appreciated, no distension, 2 surgical scars from C-sections Extremities:  No clubbing, cyanosis, edema, or deformity noted with normal full range of motion of all joints.   Skin:  Intact without suspicious lesions or rashes Psych:  Cognition and judgment appear intact. Alert and cooperative with normal attention span and concentration. No apparent delusions, illusions, hallucinations   Impression & Recommendations:  Problem # 1:  CONSTIPATION (ICD-564.00) This is most likely related to dietary changes, lack of fiber and fluids, I have discussed in detail her diet and changes that are absolutely necessary since she only has 1 BM per week. We have discussed using colace and supp temporary until her diet improves and she is able to have BM on her own, I wrote it down for her what she needs to do and I have asked her to keep food diary so that we can go over it and I told her I would like to see changes in her diet reflectied in her diary. I will check TSH today to rule  out other possibility of hypothyroidism.  Orders: T-TSH [id number]) T-Comprehensive Metabolic Panel ([id number]) T-Lipid Profile [id number])  Her updated medication list for this problem includes:    Colace 100 Mg Caps (Docusate sodium) .Marland Kitchen... Take 1 tablet by mouth two times a day    Bisacodyl 10 Mg Supp (Bisacodyl) ..... Use twice daily as needed for constipation  Problem # 2:  Hx of HYPERLIPIDEMIA (ICD-272.4) She isn ot on any medicine for it but claims she has been diagnosed with it. I will check it today and will adjust the medical regimen as indicated.  Orders: T-Comprehensive Metabolic Panel ([id number]) T-Lipid Profile ([id number])  Problem # 3:  Hx of TOBACCO ABUSE (ICD-305.1)  The following medications were removed from the medication list:    Nicotine 21 Mg/24hr Pt24 (Nicotine) .Marland Kitchen... Apply to skin and remove every 24 hours for 2 weeks    Nicotine 14 Mg/24hr Pt24 (Nicotine) .Marland Kitchen... Apply  to skin and remove every 24 hours for 2 weeks after you have finished the 2 weeks of the 7 mg patches    Nicotine 7 Mg/24hr Pt24 (Nicotine) .Marland Kitchen... Appy to skin and remove every 24 hours after you finish the 2 weeks of 14 mg patches. continue using this dose for 8 weeks, then stop.  Encouraged smoking cessation and discussed different methods for smoking cessation.   Complete Medication List: 1)  Aspir-low 81 Mg Tbec (Aspirin) .... Take 1 tablet by mouth once a day 2)  Prozac 20 Mg Caps (Fluoxetine hcl) .... Take 1 tablet by mouth once a day 3)  Dyazide 37.5-25 Mg Caps (Triamterene-hctz) .... Take 1 tablet by mouth once a day 4)  Klor-con 20 Meq Pack (Potassium chloride) .... Take 1 tablet by mouth once a day 5)  Colace 100 Mg Caps (Docusate sodium) .... Take 1 tablet by mouth two times a day 6)  Bisacodyl 10 Mg Supp (Bisacodyl) .... Use twice daily as needed for constipation  Patient Instructions: 1)  Please schedule a follow-up appointment in 3 months or sooner if your symptoms do not improve Prescriptions: BISACODYL 10 MG SUPP (BISACODYL) use twice daily as needed for constipation  #30 x 0   Entered and Authorized by:   Trinidad Curet MD   Signed by:   Trinidad Curet MD on 01/17/2010   Method used:   Print then Give to Patient   RxID:   [id number] COLACE 100 MG CAPS (DOCUSATE SODIUM) Take 1 tablet by mouth two times a day  #30 x 0   Entered and Authorized by:   Trinidad Curet MD   Signed by:   Trinidad Curet MD on 01/17/2010   Method used:   Print then Give to Patient   RxID:   [id number]    Orders Added: 1)  T-TSH [[id number]] 2)  T-Comprehensive Metabolic Panel [[id number]] 3)  T-Lipid Profile [[id number]] 4)  Est. Patient Level II [99833]   Process Orders Check Orders Results:     [facility]: ASN not required for this insurance Order queued for requisitioning for [facility]: January 17, 2010 2:40 PM Tests Sent for requisitioning (January 17, 2010 2:40  PM):     01/17/2010: [facility] -- T-TSH [id number] (signed)     01/17/2010: [facility] -- T-Comprehensive Metabolic Panel [[id number]] (signed)     01/17/2010: [facility] -- T-Lipid Profile [id number] (signed)     Appended Document: STOMACH/SB. Prescriptions: BISACODYL 10 MG SUPP (BISACODYL) use twice daily as needed for constipation  #30 x 0   Entered  and Authorized by:   Trinidad Curet MD   Signed by:   Trinidad Curet MD on 01/17/2010   Method used:   Print then Give to Patient   RxID:   [id number] COLACE 100 MG CAPS (DOCUSATE SODIUM) Take 1 tablet by mouth two times a day  #30 x 0   Entered and Authorized by:   Trinidad Curet MD   Signed by:   Trinidad Curet MD on 01/17/2010   Method used:   Print then Give to Patient   RxID:   [id number]   Appended Document: STOMACH/SB.     Vital Signs:  Patient profile:   65 year old female Height:      62 inches (157.48 cm) Weight:      153.0 pounds (69.55 kg) BMI:     28.09 Temp:     98.3 degrees F (36.83 degrees C) oral Pulse rate:   65 / minute BP sitting:   146 / 86  (right arm)  Vitals Entered By: Hilda Blades Ditzler RN (January 17, 2010 3:11 PM) Is Patient Diabetic? No Pain Assessment Patient in pain? yes     Location: upper abd Intensity: 6 Type: burning Onset of pain  past 5 days Nutritional Status BMI of 25 - 29 = overweight Nutritional Status Detail appetite down  Have you ever been in a relationship where you felt threatened, hurt or afraid?denies   Does patient need assistance? Functional Status Self care Ambulation Normal Comments Has had h/a past 4 days - taking Aleve. Upper abd burning past 5 days.   Depression History:      The patient denies a depressed mood most of the day and a diminished interest in her usual daily activities.         Preventive Screening-Counseling & Management  Alcohol-Tobacco     Alcohol drinks/day: 0     Smoking  Status: current     Smoking Cessation Counseling: yes     Smoke Cessation Stage: contemplative     Packs/Day: 0.5     Year Started: about 30 yrs.  Caffeine-Diet-Exercise     Caffeine use/day: 2     Does Patient Exercise: no  Allergies: 1)  ! Codeine Sulfate (Codeine Sulfate) 2)  ! Protonix (Pantoprazole Sodium)   Impression & Recommendations:  Problem # 1:  HYPERTENSION, UNCONTROLLED (ICD-401.9) Repeat Bp in the office was 146/86 which is still elevated and I am not sure how compliant pt is but she reports compliance with all of her medications. I would repeat BP on her next visit and if still elevated would initiate additional agent, such as norvasc for example.  Her updated medication list for this problem includes:    Dyazide 37.5-25 Mg Caps (Triamterene-hctz) .Marland Kitchen... Take 1 tablet by mouth once a day  BP today: 146/86 Prior BP: 162/103 (07/03/2009)  Labs Reviewed: K+: 3.8 (05/24/2009) Creat: : 0.77 (05/24/2009)         Prevention & Chronic Care Immunizations   Influenza vaccine: Not documented    Tetanus booster: Not documented    Pneumococcal vaccine: Not documented    H. zoster vaccine: Not documented  Colorectal Screening   Hemoccult: Not documented    Colonoscopy: Results: Polyps - 2 tubulovillous adenomas, 4 tubular adenomas, no high grade dysplasia (total 6 polyps).  Results: Hemorrhoids.  Results: Diverticulosis.   Location:  [facility]     (10/17/2008)   Colonoscopy action/deferral: Repeat colonoscopy in 2 years.    (10/17/2008)   Colonoscopy due: 10/2010  Other  Screening   Pap smear: Not documented    Mammogram: normal  (03/07/2009)   Mammogram due: 02/2010    DXA bone density scan: Not documented   Smoking status: current  (01/17/2010)   Smoking cessation counseling: yes  (01/17/2010)  Lipids   Total Cholesterol: Not documented   LDL: Not documented   LDL Direct: Not documented   HDL: Not documented   Triglycerides:  Not documented    SGOT (AST): 13  (06/22/2008)   SGPT (ALT): 10  (06/22/2008)   Alkaline phosphatase: 81  (06/22/2008)   Total bilirubin: 0.7  (06/22/2008)  Hypertension   Last Blood Pressure: 146 / 86  (01/17/2010)   Serum creatinine: 0.77  (05/24/2009)   Serum potassium 3.8  (05/24/2009)  Self-Management Support :   Personal Goals (by the next clinic visit) :      Personal blood pressure goal: 140/90  (05/24/2009)     Personal LDL goal: 100  (05/24/2009)    Patient will work on the following items until the next clinic visit to reach self-care goals:     Medications and monitoring: take my medicines every day, check my blood pressure, bring all of my medications to every visit, weigh myself weekly  (01/17/2010)     Eating: eat more vegetables, use fresh or frozen vegetables, eat baked foods instead of fried foods, limit or avoid alcohol  (01/17/2010)     Activity: take a 30 minute walk every day, take the stairs instead of the elevator  (01/17/2010)    Hypertension self-management support: Resources for patients handout  (01/17/2010)    Lipid self-management support: Written self-care plan, Education handout, Resources for patients handout  (01/17/2010)   Lipid self-care plan printed.   Lipid education handout printed      Resource handout printed.

## 2010-04-24 NOTE — Procedures (Signed)
Summary: [facility].: Colonoscopy  [facility].: Colonoscopy   Imported By: Bonner Puna 11/01/2008 15:42:22  _____________________________________________________________________  External Attachment:    Type:   Image     Comment:   External Document
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
## 2010-04-24 NOTE — Letter (Signed)
Summary: Handout Printed  Printed Handout:  - *Patient Instructions 

## 2010-04-24 NOTE — Assessment & Plan Note (Signed)
Summary: 49MONTH F/U/EST/VS   Vital Signs:  Patient profile:   65 year old female Height:      62 inches (157.48 cm) Weight:      160.7 pounds (73.05 kg) BMI:     29.50 Temp:     99.0 degrees F (37.22 degrees C) oral Pulse rate:   98 / minute BP sitting:   162 / 103  (right arm)  Vitals Entered By: Mateo Flow Deborra Medina) (July 03, 2009 2:37 PM) CC: pt c/o "sore throat" x 2-3 days Is Patient Diabetic? No Pain Assessment Patient in pain? yes     Location: throat Intensity: 8 Type: sharp Onset of pain  Constant s 2-3 days Nutritional Status BMI of 25 - 29 = overweight  Have you ever been in a relationship where you felt threatened, hurt or afraid?No   Does patient need assistance? Functional Status Self care Ambulation Normal   Primary Care Provider:  Izora Gala Rodgers  CC:  pt c/o "sore throat" x 2-3 days.  History of Present Illness: Debra Rodgers is a 65 yo woman with PMH in EMR who presents for f/u of cough, now with new URI symptoms. She says since Saturday, she has had a sore throat and facial pressure. Denies fevers. Reports that her cough has gotten better, though she is still coughing up clear-white phlegm occasionally, and continues to have some nasal congestion. She says that she did not use the Nasonex prescribed at her last visit in 3/11 because she couldn't tolerate spraying something up her nose.  Preventive Screening-Counseling & Management  Alcohol-Tobacco     Alcohol drinks/day: 0     Smoking Status: current     Smoking Cessation Counseling: yes     Packs/Day: 1/2     Year Started: about 30 yrs.  Current Medications (verified): 1)  Aspir-Low 81 Mg Tbec (Aspirin) .... Take 1 Tablet By Mouth Once A Day 2)  Prozac 20 Mg Caps (Fluoxetine Hcl) .... Take 1 Tablet By Mouth Once A Day 3)  Dyazide 37.5-25 Mg Caps (Triamterene-Hctz) .... Take 1 Tablet By Mouth Once A Day 4)  Klor-Con 20 Meq Pack (Potassium Chloride) .... Take 1 Tablet By Mouth Once A Day 5)   Flexeril 5 Mg Tabs (Cyclobenzaprine Hcl) .... Take 1 Tablet Every 8 Hours As Needed For Muscle Pain 6)  Naprosyn 500 Mg Tabs (Naproxen) .... Take 1 Pill Twice Daily As Needed For Pain. 7)  Nasonex 50 Mcg/act Susp (Mometasone Furoate) .... 2 Sprays in Each Nostril Daily 8)  Nicotine 21 Mg/24hr Pt24 (Nicotine) .... Apply To Skin and Remove Every 24 Hours For 2 Weeks 9)  Nicotine 14 Mg/24hr Pt24 (Nicotine) .... Apply To Skin and Remove Every 24 Hours For 2 Weeks After You Have Finished The 2 Weeks of The 7 Mg Patches 10)  Nicotine 7 Mg/24hr Pt24 (Nicotine) .... Appy To Skin and Remove Every 24 Hours After You Finish The 2 Weeks of 14 Mg Patches. Continue Using This Dose For 8 Weeks, Then Stop.  Allergies: 1)  ! Codeine Sulfate (Codeine Sulfate) 2)  ! Protonix (Pantoprazole Sodium)  Review of Systems      See HPI  Physical Exam  General:  Well-developed,well-nourished,in no acute distress; alert,appropriate and cooperative throughout examination Head:  Normocephalic and atraumatic. Frontal and maxillary sinuses tender to palpation. Nose:  erythematous nasal passages Mouth:  Oral mucosa and oropharynx without erythema, lesions, or exudates.   Lungs:  Normal respiratory effort, chest expands symmetrically. Lungs are clear to auscultation, no  crackles or wheezes. Heart:  Normal rate and regular rhythm. S1 and S2 normal without gallop, murmur, click, rub or other extra sounds. Neurologic:  alert & oriented X3.   Psych:  Cognition and judgment appear intact. Alert and cooperative with normal attention span and concentration. No apparent delusions, illusions, hallucinations   Impression & Recommendations:  Problem # 1:  SINUSITIS (ICD-473.9) Her symptoms and exam are consistent with sinus infection, likely viral. I suggested she treat her sore throat with Chloraseptic Spray and told her to please call the clinic on Friday if her symptoms have not improved. If they have not improved, I will  prescribe an antibiotic for her at that time. She agreed.  Her updated medication list for this problem includes:    Nasonex 50 Mcg/act Susp (Mometasone furoate) .Marland Kitchen... 2 sprays in each nostril daily  Problem # 2:  COUGH, CHRONIC (ICD-786.2) Improved without the use of Nasonex, but still continues. I think this is still the result of post-nasal drip and that she would benefit from an inhaled steroid. It should hopefully improve when allergy season ends. Will follow-up as needed.   Complete Medication List: 1)  Aspir-low 81 Mg Tbec (Aspirin) .... Take 1 tablet by mouth once a day 2)  Prozac 20 Mg Caps (Fluoxetine hcl) .... Take 1 tablet by mouth once a day 3)  Dyazide 37.5-25 Mg Caps (Triamterene-hctz) .... Take 1 tablet by mouth once a day 4)  Klor-con 20 Meq Pack (Potassium chloride) .... Take 1 tablet by mouth once a day 5)  Flexeril 5 Mg Tabs (Cyclobenzaprine hcl) .... Take 1 tablet every 8 hours as needed for muscle pain 6)  Naprosyn 500 Mg Tabs (Naproxen) .... Take 1 pill twice daily as needed for pain. 7)  Nasonex 50 Mcg/act Susp (Mometasone furoate) .... 2 sprays in each nostril daily 8)  Nicotine 21 Mg/24hr Pt24 (Nicotine) .... Apply to skin and remove every 24 hours for 2 weeks 9)  Nicotine 14 Mg/24hr Pt24 (Nicotine) .... Apply to skin and remove every 24 hours for 2 weeks after you have finished the 2 weeks of the 7 mg patches 10)  Nicotine 7 Mg/24hr Pt24 (Nicotine) .... Appy to skin and remove every 24 hours after you finish the 2 weeks of 14 mg patches. continue using this dose for 8 weeks, then stop.  Patient Instructions: 1)  Please schedule a follow-up appointment in 3 months. 2)  If by Friday your symptoms have not improved, please call the clinic and I will prescribe you an antibiotic. Also, call the clinic if you develop a high fever. 3)  For your sore throat, try Chloraseptic Spray. It helps to numb the throat.  4)  Please call the clinic with any questions or concerns.

## 2010-04-24 NOTE — Consult Note (Signed)
Summary: [facility]:Dr. Supple  [facility]:Dr. Supple   Imported By: Bonner Puna 10/31/2006 16:24:26  _____________________________________________________________________  External Attachment:    Type:   Image     Comment:   External Document

## 2010-04-24 NOTE — Miscellaneous (Signed)
Summary: update mammo due date  Clinical Lists Changes  Observations: Added new observation of MAMMO DUE: 02/2010 (03/07/2009 10:55) Added new observation of MAMMOGRAM: normal (03/07/2009 10:55)      Complete Medication List: 1)  Aspir-low 81 Mg Tbec (Aspirin) .... Take 1 tablet by mouth once a day 2)  Prozac 20 Mg Caps (Fluoxetine hcl) .... Take 1 tablet by mouth once a day 3)  Dyazide 37.5-25 Mg Caps (Triamterene-hctz) .... Take 1 tablet by mouth once a day 4)  Klor-con 20 Meq Pack (Potassium chloride) .... Take 1 tablet by mouth once a day 5)  Zanaflex 4 Mg Tabs (Tizanidine hcl) .... Take 1 tab four times a day as needed for muscle pain 6)  Naprosyn 500 Mg Tabs (Naproxen) .... Take 1 pill twice daily as needed for pain.    Preventive Care Screening  Mammogram:    Date:  03/07/2009    Next Due:  02/2010    Results:  normal

## 2010-04-24 NOTE — Assessment & Plan Note (Signed)
Summary: STOMACH/ (Asa Fath)SB.   Vital Signs:  Patient Profile:   65 Years Old Female Height:     62 inches (157.48 cm) Weight:      161.6 pounds (73.45 kg) BMI:     29.66 Temp:     98.2 degrees F (36.78 degrees C) oral Pulse rate:   84 / minute BP sitting:   133 / 84  (right arm)  Pt. in pain?   yes    Location:   abdomen    Intensity:   5    Type:       burning  Vitals Entered By: Mollie Germany (June 25, 2007 2:22 PM)              Is Patient Diabetic? No Nutritional Status BMI of 25 - 29 = overweight Nutritional Status Detail decreased due to abd. pain  Have you ever been in a relationship where you felt threatened, hurt or afraid?No   Does patient need assistance? Functional Status Self care Ambulation Normal     PCP:  Izora Gala Phifer  Chief Complaint:  Abdominal Pain and tried maalox and pepto with slight improvement.  History of Present Illness: Debra Rodgers is a 65 y/o woman with a hx of HTN, HL, major depression who is a patient of Dr. Ermalinda Memos who presents to the opc with c/o abdominal pain. Patient has been having abdoinal pain x 1 week. Had similar sx a long, long time ago when she was told she might have a peptic ulcer (they gave her tagamet and the sx went away). Never needed an EGD. Never had pain again up until now. Started progressively. Hurts in epigasric region, without radiation. Comes and goes; gets the pain after eating. Hasn't noticed if anything making pain worse. Has been avoiding fried foods. Maalox and Pepto-Bismol help for a little while. No pyrosis. No dysphagia or odynophagia, no vomiting but has felt nauseated. No melena, has noticed solid black stool when using pepto bismol. Drinks coffee daily and lots of soda (pepsi, mountain dew, sprite).  No weight loss, no frank fever but her temp was up to 99.6 last Monday, has been having some nighttime sweats for years.  Had her gallbladder removed several years ago (over 30 yrs ago). Smokes half a pack a  day.    Prior Medication List:  ASPIR-LOW 81 MG TBEC (ASPIRIN) Take 1 tablet by mouth once a day PROZAC 20 MG CAPS (FLUOXETINE HCL) Take 1 tablet by mouth once a day DYAZIDE 37.5-25 MG CAPS (TRIAMTERENE-HCTZ) Take 1 tablet by mouth once a day KLOR-CON 20 MEQ PACK (POTASSIUM CHLORIDE) Take 1 tablet by mouth once a day DARVOCET-N 100 100-650 MG TABS (PROPOXYPHENE N-APAP) take one tab every 6 hrs as required CHANTIX STARTING MONTH PAK 0.5 MG X 11 & 1 MG X 42 MISC (VARENICLINE TARTRATE) Take as advised.   Current Allergies (reviewed today): ! CODEINE SULFATE (CODEINE SULFATE)    Risk Factors:  Tobacco use:  current    Cigarettes:  Yes -- 1/2 pack(s) per day Caffeine use:  2 drinks per day Alcohol use:  no Exercise:  no Seatbelt use:  100 %   Review of Systems      See HPI   Physical Exam  General:     alert, well-developed, well-nourished, and well-hydrated.  Middle-aged woman in NAD. Younger appearing than stated age. Head:     atraumatic.   Eyes:     vision grossly intact, pupils equal, pupils round, and pupils reactive to  light.   Mouth:     OP clear, MMM. Neck:     Shotty  superior anterior cervical lymphadnp but no TM or JVD. No carotid bruits. Lungs:     CTAB with good air mvt Heart:     RRR, no m/r/g Abdomen:     BS+, soft, moderate TTP of epigastrum and RUQ with voluntary guarding but no rebound tenderness, palpable mass or organomegaly. Murphy's negative. Lower vertical scar and cholecystectomy scar. Pulses:     2+ posterior tibial pulses bilaterally Extremities:     No e/c/c. Neurologic:     Grossly non focal.    Impression & Recommendations:  Problem # 1:  GERD (ICD-530.81) Assessment: New Debra Rodgers's sx are consistant with GERD +/- gastritis/reflux esophagitis. She had good response to a PPI in the past. She does not have any alarm sx warranting emergent endoscopy. However, she is 65 years of age so we must keep a low threshold for referral to  GI. Will treat her with a PPI for 1-2 months. If an attempt to d/c the medication is done and the sx recur, an H. pylori screen would be beneficial.  Her updated medication list for this problem includes:    Protonix 40 Mg Tbec (Pantoprazole sodium) .Marland Kitchen... Take 1 tablet by mouth once a day 30 minutes before breakfast.   Complete Medication List: 1)  Aspir-low 81 Mg Tbec (Aspirin) .... Take 1 tablet by mouth once a day 2)  Prozac 20 Mg Caps (Fluoxetine hcl) .... Take 1 tablet by mouth once a day 3)  Dyazide 37.5-25 Mg Caps (Triamterene-hctz) .... Take 1 tablet by mouth once a day 4)  Klor-con 20 Meq Pack (Potassium chloride) .... Take 1 tablet by mouth once a day 5)  Darvocet-n 100 100-650 Mg Tabs (Propoxyphene n-apap) .... Take one tab every 6 hrs as required 6)  Chantix Starting Month Pak 0.5 Mg X 11 & 1 Mg X 42 Misc (Varenicline tartrate) .... Take as advised. 7)  Protonix 40 Mg Tbec (Pantoprazole sodium) .... Take 1 tablet by mouth once a day 30 minutes before breakfast.   Patient Instructions: 1)  Avoid foods high in acid (tomatoes, citrus juices, spicy foods). Avoid fried foods, caffeine (chocolate, coffee, tea), alcohol and mint. Avoid eating within two hours of lying down or before exercising. Do not over eat; try smaller more frequent meals. Elevate head of bed twelve inches when sleeping. 2)  Take the pantoprazole 30 minutes before breakfast. 3)  Please schedule a follow-up appointment in 1 month. (come back sooner if you're not better in a week or two).    Prescriptions: PROTONIX 40 MG  TBEC (PANTOPRAZOLE SODIUM) Take 1 tablet by mouth once a day 30 minutes before breakfast.  #30 x 2   Entered and Authorized by:   Burton Apley MD   Signed by:   Burton Apley MD on 06/25/2007   Method used:   Print then Give to Patient   RxID:   [id number]  ]

## 2010-05-08 ENCOUNTER — Ambulatory Visit (INDEPENDENT_AMBULATORY_CARE_PROVIDER_SITE_OTHER): Payer: 59 | Admitting: Internal Medicine

## 2010-05-08 ENCOUNTER — Other Ambulatory Visit: Payer: Self-pay | Admitting: Internal Medicine

## 2010-05-08 ENCOUNTER — Encounter: Payer: Self-pay | Admitting: Internal Medicine

## 2010-05-08 DIAGNOSIS — J42 Unspecified chronic bronchitis: Secondary | ICD-10-CM

## 2010-05-08 DIAGNOSIS — J479 Bronchiectasis, uncomplicated: Secondary | ICD-10-CM | POA: Insufficient documentation

## 2010-05-08 DIAGNOSIS — J209 Acute bronchitis, unspecified: Secondary | ICD-10-CM

## 2010-05-08 DIAGNOSIS — F17200 Nicotine dependence, unspecified, uncomplicated: Secondary | ICD-10-CM

## 2010-05-08 MED ORDER — ALBUTEROL SULFATE (2.5 MG/3ML) 0.083% IN NEBU
2.5000 mg | INHALATION_SOLUTION | Freq: Once | RESPIRATORY_TRACT | Status: AC
  Administered 2010-05-08: 2.5 mg via RESPIRATORY_TRACT

## 2010-05-08 MED ORDER — AZITHROMYCIN 250 MG PO TABS: ORAL_TABLET | ORAL | Status: AC

## 2010-05-08 NOTE — Progress Notes (Signed)
  Subjective:    Patient ID: Debra Rodgers, female    DOB: 1946-01-20, 66 y.o.   MRN: [id number]  HPI Pleasant woman, smoker with about 20 pack-year history here for ecacerbation of chronic bronchitis. Onset of mild chills, Mild chest tightness but no severe symptoms   Review of Systems As above        Objective:   Physical Exam  Constitutional: She appears well-developed and well-nourished.  HENT:  Head: Normocephalic and atraumatic.  Eyes: Conjunctivae are normal.  Neck: Normal range of motion. Neck supple.  Cardiovascular: Normal rate, regular rhythm, normal heart sounds and intact distal pulses.   Pulmonary/Chest: Effort normal. No respiratory distress. She has wheezes. She has no rales.   She exhibits no tenderness.  Musculoskeletal: Normal range of motion.  Lymphadenopathy:    She has no cervical adenopathy.  Skin: Skin is warm and dry. No rash noted. No erythema.          Assessment & Plan:

## 2010-05-08 NOTE — Progress Notes (Signed)
Addendum created to add nebulizer charge.

## 2010-05-08 NOTE — Assessment & Plan Note (Signed)
Given number for quit line.   Advised of risks, medication options-she will think it over

## 2010-05-08 NOTE — Assessment & Plan Note (Signed)
Given nebulized albuterol; 2.5 mg  Azithromycin 5 day rx  Advised on smoking cessation  F/U with regular provider in 6-10 weeks-consider PFT's at that time

## 2010-05-08 NOTE — Progress Notes (Signed)
Addended by: Despina Hidden on: 05/08/2010 04:38 PM   Modules accepted: Orders

## 2010-05-16 ENCOUNTER — Telehealth: Payer: Self-pay | Admitting: *Deleted

## 2010-05-16 NOTE — Telephone Encounter (Signed)
Return call to pt and not at home.

## 2010-07-03 ENCOUNTER — Other Ambulatory Visit: Payer: Self-pay | Admitting: *Deleted

## 2010-07-03 MED ORDER — TRIAMTERENE-HCTZ 37.5-25 MG PO CAPS: 1.0000 | ORAL_CAPSULE | Freq: Every day | ORAL | Status: DC

## 2010-07-03 MED ORDER — FLUOXETINE HCL 20 MG PO CAPS: 20.0000 mg | ORAL_CAPSULE | Freq: Every day | ORAL | Status: DC

## 2010-07-03 NOTE — Telephone Encounter (Signed)
Pt is out of meds, please refill

## 2010-07-13 ENCOUNTER — Encounter: Payer: 59 | Admitting: Internal Medicine

## 2010-08-07 NOTE — Op Note (Signed)
Debra Rodgers, Debra Rodgers               ACCOUNT NO.:  [id number]   MEDICAL RECORD NO.:  [id number]          PATIENT TYPE:  INP   LOCATION:  5122                         FACILITY:  [facility]   PHYSICIAN:  Leeroy Cha, M.D.   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   POSTOPERATIVE DIAGNOSES:  C3-C4 and C4-C5 stenosis with radiculopathy  and myelopathy, weakness of both deltoid, left worse than the right.   PROCEDURE:  Anterior C3-C4 and C4-C5 discectomy, decompression of the  spinal cord, interbody fusion with allograft plate from C3 to C5,  microscope.   SURGEON:  Leeroy Cha, M.D.   ASSISTANT:  Elizabeth Sauer, M.D.   CLINICAL HISTORY:  The patient was seen in my office because of neck  pain with radiation to both upper extremities with weakness of the left  deltoid more than the right one.  The patient had difficulty walking.  X-  rays showed stenosis at the level of C3-C4 and C4-C5.  The former is the  worse.  Surgery was advised.  The risks were explained in the history  and physical.   OPERATIVE PROCEDURE:  The patient was taken to the OR and she was  positioned in the supine position.  Then the left side of the neck was  cleaned with DuraPrep.  Drapes were applied.  A transverse incision was  made through the skin, platysma, down to the cervical spine. X-ray  showed that we were at the level of C4-C5.  The patient had a large  osteophyte at the level of C3-C4 and C4-C5 and was removed with the  [place].  Then, we entered disc space at the level of C3-C4 first.  The  patient has quite a bit of degenerative disc.  Using the drill as well  as the microcurette, we were able to accomplish total discectomy. The  posterior ligament was seen and decompression was  achieved not only of  the spinal cord but also of the C4 nerve root.  At the level of C4-C5,  we found the same findings except that the left foramen at C4-C5 was  quite narrow and the patient had some herniated disc with spondylosis  and scar tissue surrounding the C5 nerve root.  Using the 1 mm Kerrison  punch as well as the 2 and 3 mm, we were able to decompress the C5 nerve  root.  Decompression of the spinal cord was achieved as well as the  right nerve root.  Then, two pieces of allograft was tailored down to 7  mm were inserted.  The plate was applied and screws were used to keep  the plate in place.  Nevertheless, the upper part of the plate showed  that the screws were going into the disc space of C2-C3.  From then on,  we used a 5 mm smaller plate.  Then, six screws were used to  keep the  plate in place.  Lateral cervical spine film showed good position of the  bone graft and the plate.  From then on, the area was irrigated.  A  Hemovac was left in the precervical area and the wound was closed with  Vicryl and Steri-Strips.          ______________________________  Leeroy Cha, M.D.    EB/MEDQ  D:  01/02/2007  T:  01/03/2007  Job:  [id number]

## 2010-08-07 NOTE — H&P (Signed)
Debra Rodgers, Debra Rodgers               ACCOUNT NO.:  [id number]   MEDICAL RECORD NO.:  [id number]          PATIENT TYPE:  INP   LOCATION:  5122                         FACILITY:  [facility]   PHYSICIAN:  Leeroy Cha, M.D.   DATE OF BIRTH:  1945-09-22   DATE OF ADMISSION:  01/02/2007  DATE OF DISCHARGE:                              HISTORY & PHYSICAL   Debra Rodgers is a lady who had been seen by me in my office on several  occasions because of neck pain with radiation to the left lower  extremity.  This pain had been going on for almost 9 months and is  getting worse.  Also, she had problems with balance.  The patient has  EMG which showed that she has a C5 radiculopathy.  The patient had a  cervical myelogram and, because of the finding, she is being admitted  for surgery.   PAST MEDICAL HISTORY:  1. Cholecystectomy.  2. Hysterectomy.  3. Left shoulder surgery.   ALLERGIES:  She is allergic to CODEINE and DILAUDID.   SOCIAL HISTORY:  Does not drink.  She smokes 1/2 pack a day.   FAMILY HISTORY:  Unremarkable.   REVIEW OF SYSTEMS:  Positive for neck pain, hand weakness, depression,  problems with memory.   PHYSICAL EXAMINATION:  EARS, NOSE, THROAT:  Normal.  NECK:  She has some discomfort with mobilization of the neck.  LUNGS:  Clear.  CARDIAC:  Heart sounds normal.  ABDOMEN:  Normal.  EXTREMITIES:  Normal pulses.  NEUROLOGIC:  Mental status normal.  Cranial nerves normal.  Strength:  She has a weakness of both deltoids, left one more than right one.  She  has normal biceps, triceps.  Reflexes 1+.  Coordination normal.  She had  difficulty walking a straight line.   The x-rays showed that she has severe stenosis and facet arthropathy at  the level of 3-4 and 4-5.   CLINICAL IMPRESSION:  Cervical stenosis at the level of C3-4 and C4-C5.   RECOMMENDATIONS:  The patient is being admitted for two-level anterior  cervical diskectomy. The procedure was fully explained to her, and  the  risks were explained to her husband in my office including possibility  of no improvement whatsoever, failure of hardware, stroke, infection,  CSF leak, need for further surgery.           ______________________________  Leeroy Cha, M.D.     EB/MEDQ  D:  01/02/2007  T:  01/03/2007  Job:  [id number]

## 2010-08-07 NOTE — Discharge Summary (Signed)
Debra Rodgers, Debra Rodgers               ACCOUNT NO.:  [id number]   MEDICAL RECORD NO.:  [id number]          PATIENT TYPE:  INP   LOCATION:  6203                         FACILITY:  [facility]   PHYSICIAN:  Leeroy Cha, M.D.   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   CLINICAL HISTORY:  The patient was admitted because of neck pain  radiating to both upper extremities.  The patient has failed  conservative treatment.  Because of x-ray finding and clinically,  surgery was advised.  Laboratory normal.   COURSE IN THE HOSPITAL:  The patient was taken to surgery, C3-4, C4-5  fusion was done.  Today she is doing much better.  She had some weakness  of the right deltoid as preop.  She is going to be discharged to be  followed by me in 4 weeks.   CONDITION ON DISCHARGE:  Improvement in relation to the pain.   DISCHARGE MEDICATIONS:  Percocet, diazepam.   DIET:  Regular.   ACTIVITY:  Not to drive for least a week.   FOLLOW UP:  She will be seen by me in 4 weeks.           ______________________________  Leeroy Cha, M.D.     EB/MEDQ  D:  01/05/2007  T:  01/05/2007  Job:  [id number]

## 2010-08-10 NOTE — Consult Note (Signed)
[facility]  Patient:    Debra Rodgers, Debra Rodgers Visit Number: [id number] MRN: [id number]          Service Type: PMG Location: [facility] Attending Physician:  Cloria Spring Dictated by:   Cloria Spring, D.O. Proc. Date: 08/13/01 Admit Date:  08/12/2001   CC:         Debra Rodgers, M.D., Debra Rodgers, c/o [facility], [address], [place], [place] [address]                          Consultation Report  NEW PATIENT CONSULTATION  REFERRING Debra Rodgers:  Dr. Dannial Rodgers, [facility]  Dear Dr. Geoffry Rodgers:  Thank you very much for kindly referring Debra Rodgers to [facility] for evaluation.  Debra Rodgers was evaluated in our clinic today.  Please refer to the following for details regarding the history, physical examination, and treatment recommendations.  Once again, thank you for allowing [place] to participate in the care of Debra Rodgers.  CHIEF COMPLAINT:  Left neck, shoulder and upper extremity pain.  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  She sustained a work-related injury on April 12, 2000, at which time she was lifting an obese patient at [facility] when the patient fell onto her left upper extremity.  She noted significant shoulder and arm pain at that time.  She was initially seen in the emergency department several days later and had x-rays of her neck which she reports as normal.  She was then followed by [facility] between January of 2002 and July of 2002, at which time her Workers Compensation apparently ceased any further care for Debra Rodgers.  She has had a course of physical therapy, which included four visits, which she states helped some.  Most of this seemed to be passive modalities.  Currently she complains of left-sided neck pain radiating into her left  upper extremity to include her left shoulder radiating into her posterolateral left arm to her radial forearm, with associated numbness and paresthesias in her left thumb, index finger, middle finger, ring finger, and occasionally into her small finger.  She notes significant left shoulder pain when reaching overhead.  She does admit to associated weakness with left grip strength.  Currently her pain is an 8/10 on a subjective scale, and described as constant, throbbing, and burning with associated numbness, tingling and weakness as noted.  The patients symptoms are worse with walking, sitting, working, and reaching overhead, and improved with heat.  She has taken Darvocet in the past on occasion with minimal improvement.  Currently she is not taking any medications for pain.  Her function and quality of life indices have declined.  Her sleep is poor.  She does feel somewhat depressed secondary to not being able to work in her previous position as well as not being able to do the things she likes to do recreationally.  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  I review health and history form, and 14-point review of systems.  The patient also admits to occasional headache which is associated with her neck pain.  She notes blurry vision on occasion, constipation.  PAST MEDICAL HISTORY: 1. Hypercholesterolemia. 2. Hypertension.  PAST SURGICAL HISTORY:  C-section and gallstone removal.  FAMILY  HISTORY:  Cancer, diabetes, hypertension.  SOCIAL HISTORY:  The patient smokes one-half pack of cigarettes per day and denies alcohol use.  She is married and continues to work as noted.  She does have an attorney helping her with her case.  ALLERGIES:  CODEINE.  CURRENT MEDICATIONS:  Lipitor, antihypertensive, and aspirin.  PHYSICAL EXAMINATION:  GENERAL:  The patient is a healthy female in no acute distress.  VITAL SIGNS:  Blood pressure 131/78, pulse  72, respirations 16, O2 saturation is 99% on room air.  BACK:  Level shoulder height with mild scapular dyskinesis on the left with range of motion.  MUSCULOSKELETAL:  Range of motion of the shoulders is full bilaterally with discomfort on the left and range of motion with both flexion and abduction. Negative drop arm test.  Provocative maneuvers of the upper extremities reveal positive Hawkins, Neer, empty-can, OBrien on the left and negative Speed test on the left.  Palpatory examination reveals active trigger points in the left upper trapezius and levator scapulae which reproduced some degree of patients numbness and paresthesias in her upper extremity.  There is no atrophy noted in the upper extremities bilaterally.  Spurling maneuver is negative bilaterally.  Tinel test is positive over the median nerve at the left wrist, negative over the ulnar nerve at the left wrist and left elbow.  Positive Phalen maneuver on the left at approximately 30 seconds.  NEUROLOGIC:  Manual muscle testing is 5/5 bilateral upper extremities at this time.  Sensory examination reveals diffuse decreased light touch mildly in the left upper extremity in a nondermatomal distribution.  Muscle stretch reflexes are 2+/4 bilateral biceps, triceps, brachioradialis, and pronator teres.  NECK:  No cervical lymphadenopathy noted.  EXTREMITIES:  No heat, erythema or edema in the upper extremities.  IMPRESSION: 1. Right rotator cuff syndrome/impingement syndrome. 2. Secondary myofascial pain with above noted trigger points. 3. Cervicalgia, likely myofascial. 4. Possible mild carpal tunnel syndrome. 5. Cannot rule out cervical radiculopathy/radiculitis.  Clinical exam is not    entirely consistent with this at this time, however.  RECOMMENDATIONS: 1. I would recommend a diagnostic/therapeutic left subacromial steroid    injection for impingement syndrome. 2. Recommend trigger point injections to the above  noted trigger points.  3. Recommend a progressive physical therapy program and possibly aquatic    therapy. 4. Consider electrodiagnostic studies to rule out cervical radiculopathy and    carpal tunnel syndrome.  Will be happy to perform these studies here. 5. If symptoms are not improving, would consider MRI of the left shoulder and    possibly even an MRI of the cervical spine at some point if shoulder MRI    is nondiagnostic.  The patient was educated in the above findings and recommendations, and understands.  There were no barriers to communication. Dictated by:   Cloria Spring, D.O. Attending Physician:  Cloria Spring DD:  08/13/01 TD:  08/17/01 Job: [id number]

## 2010-08-29 ENCOUNTER — Encounter: Payer: Self-pay | Admitting: Internal Medicine

## 2010-09-14 ENCOUNTER — Ambulatory Visit (INDEPENDENT_AMBULATORY_CARE_PROVIDER_SITE_OTHER): Payer: 59 | Admitting: Internal Medicine

## 2010-09-14 VITALS — BP 134/89 | HR 75 | Temp 98.7°F | Ht 64.0 in | Wt 150.7 lb

## 2010-09-14 DIAGNOSIS — K209 Esophagitis, unspecified without bleeding: Secondary | ICD-10-CM

## 2010-09-14 DIAGNOSIS — K219 Gastro-esophageal reflux disease without esophagitis: Secondary | ICD-10-CM

## 2010-09-14 DIAGNOSIS — I10 Essential (primary) hypertension: Secondary | ICD-10-CM

## 2010-09-14 MED ORDER — SUCRALFATE 1 G PO TABS: 1.0000 g | ORAL_TABLET | Freq: Four times a day (QID) | ORAL | Status: DC

## 2010-09-14 MED ORDER — FAMOTIDINE 20 MG PO TABS: 20.0000 mg | ORAL_TABLET | Freq: Two times a day (BID) | ORAL | Status: DC

## 2010-09-14 NOTE — Patient Instructions (Signed)
Avoid caffeine, chocolate, fried food. Take your medicine regularly for one month. Return in one month.

## 2010-09-14 NOTE — Assessment & Plan Note (Signed)
BP controlled. Continue all other meds.

## 2010-09-14 NOTE — Progress Notes (Signed)
  Subjective:    Patient ID: Debra Rodgers, female    DOB: [birth year], 65 y.o.   MRN: [id number]  HPI 65 year old female with past medical history of GERD, hypertension, constipation his ear with complaints of abdominal pain. She reports that about pain is mainly epigastric where is from pain constant through episodic does not radiate in great at 5/10. Sometime it is associated with fatty food intake. Also worse after taking caffeine containing drinks. She has not taken any medications for this right now.  Patient also reports that sometimes food feels stuck in her throat. She also has food coming up her feeding tube into her throat. She also feels chest pain sometime associated with food intake and sour taste in the mouth. She denies any weight loss. She denies any vomiting. She endorses nausea time to time. Patient denies any urinary complaints. Patient continues to smoke about half a pack a day.   Review of Systems  All other systems reviewed and are negative.       Objective:   Physical Exam  Constitutional: She is oriented to person, place, and time. She appears well-developed and well-nourished.  HENT:  Head: Normocephalic and atraumatic.  Right Ear: External ear normal.  Left Ear: External ear normal.  Eyes: Conjunctivae and EOM are normal. Pupils are equal, round, and reactive to light.  Neck: No JVD present. No tracheal deviation present. No thyromegaly present.  Cardiovascular: Normal rate, regular rhythm and normal heart sounds.  Exam reveals no gallop.   No murmur heard. Pulmonary/Chest: No respiratory distress. She has no wheezes. She has no rales. She exhibits no tenderness.  Abdominal: Soft. Bowel sounds are normal. She exhibits no distension and no mass. There is no hepatosplenomegaly. There is tenderness in the epigastric area. There is no rigidity, no rebound, no guarding, no CVA tenderness, no tenderness at McBurney's point and negative Murphy's sign.  Musculoskeletal:  Normal range of motion. She exhibits no edema and no tenderness.  Lymphadenopathy:    She has no cervical adenopathy.  Neurological: She is alert and oriented to person, place, and time. She has normal reflexes. No cranial nerve deficit. Coordination normal.  Skin: No rash noted. No erythema.  Psychiatric: She has a normal mood and affect. Her behavior is normal. Thought content normal.          Assessment & Plan:

## 2010-10-19 ENCOUNTER — Ambulatory Visit (INDEPENDENT_AMBULATORY_CARE_PROVIDER_SITE_OTHER): Payer: 59 | Admitting: Internal Medicine

## 2010-10-19 ENCOUNTER — Encounter: Payer: Self-pay | Admitting: Internal Medicine

## 2010-10-19 DIAGNOSIS — I10 Essential (primary) hypertension: Secondary | ICD-10-CM

## 2010-10-19 DIAGNOSIS — E785 Hyperlipidemia, unspecified: Secondary | ICD-10-CM

## 2010-10-19 DIAGNOSIS — F17200 Nicotine dependence, unspecified, uncomplicated: Secondary | ICD-10-CM

## 2010-10-19 DIAGNOSIS — K219 Gastro-esophageal reflux disease without esophagitis: Secondary | ICD-10-CM

## 2010-10-19 LAB — COMPREHENSIVE METABOLIC PANEL
ALT: 9 U/L (ref 0–35)
AST: 15 U/L (ref 0–37)
Albumin: 4.3 g/dL (ref 3.5–5.2)
Alkaline Phosphatase: 63 U/L (ref 39–117)
BUN: 21 mg/dL (ref 6–23)
CO2: 24 mEq/L (ref 19–32)
Calcium: 9.8 mg/dL (ref 8.4–10.5)
Chloride: 105 mEq/L (ref 96–112)
Creat: 0.8 mg/dL (ref 0.50–1.10)
Glucose, Bld: 86 mg/dL (ref 70–99)
Potassium: 3.7 mEq/L (ref 3.5–5.3)
Sodium: 140 mEq/L (ref 135–145)
Total Bilirubin: 0.7 mg/dL (ref 0.3–1.2)
Total Protein: 7.4 g/dL (ref 6.0–8.3)

## 2010-10-19 LAB — LIPID PANEL
Cholesterol: 276 mg/dL — ABNORMAL HIGH (ref 0–200)
HDL: 59 mg/dL (ref >39)
LDL Cholesterol: 189 mg/dL — ABNORMAL HIGH (ref 0–99)
Total CHOL/HDL Ratio: 4.7 Ratio
Triglycerides: 138 mg/dL (ref <150)
VLDL: 28 mg/dL (ref 0–40)

## 2010-10-19 MED ORDER — FAMOTIDINE 20 MG PO TABS: 20.0000 mg | ORAL_TABLET | Freq: Two times a day (BID) | ORAL | Status: DC

## 2010-10-19 NOTE — Patient Instructions (Addendum)
Keep taking the Pepcid 20 mg twice daily for your stomach.  Take the Carafate as well 4 times daily.  Continue your other medications as prescribed.  Stop in the lab to have your testing drawn.  If we need to follow up on it I will give you a call.  Otherwise we will talk about it at your follow up appointment.   Follow up in 1-2 months to see how you're doing with the stomach medications.

## 2010-10-19 NOTE — Progress Notes (Signed)
Subjective:   Patient ID: Debra Rodgers female   DOB: [birth year] 65 y.o.   MRN: [id number]  HPI: [name] is a 65 y.o. who presents to clinic today for follow up from her last clinic visit.  She was seen by Dr. Manuella Ghazi and started on Pepcid 20 mg twice daily and Carafate for her GERD symptoms.  She is unable to use PPI because of a documented allergy to Protonix.  She took the Pepcid twice daily for sometime and was feeling pretty good.  When her Pepcid ran out she started taking the carafate and has been not feeling well since then.  She continues to have belching and abdominal pain usually before meals or right away in the morning.    She continues to smoke at least 1/2 ppd and has for many years.  She states she knows she needs to quit but just can't bring herself to do it.    Her other chronic medical problems include HTN and hyperlipidemia.  She states she has been taking her medications as prescribed for her blood pressure and has been trying to watch her diet.    Past Medical History  Diagnosis Date  . Depression   . History of migraine headaches    Current Outpatient Prescriptions  Medication Sig Dispense Refill  . aspirin 81 MG EC tablet Take 81 mg by mouth daily.        . bisacodyl (DULCOLAX) 10 MG suppository Place 10 mg rectally 2 (two) times daily as needed. For constipation       . docusate sodium (COLACE) 100 MG capsule TAKE ONE CAPSULE BY MOUTH TWICE A DAY  30 capsule  0  . famotidine (PEPCID) 20 MG tablet Take 1 tablet (20 mg total) by mouth 2 (two) times daily.  60 tablet  3  . FLUoxetine (PROZAC) 20 MG capsule Take 1 capsule (20 mg total) by mouth daily.  90 capsule  3  . potassium chloride SA (K-DUR,KLOR-CON) 20 MEQ tablet Take 20 mEq by mouth daily.        . sucralfate (CARAFATE) 1 G tablet Take 1 tablet (1 g total) by mouth 4 (four) times daily.  120 tablet  1  . triamterene-hydrochlorothiazide (DYAZIDE) 37.5-25 MG per capsule Take 1 capsule by mouth daily.  90  capsule  3   Family History  Problem Relation Age of Onset  . Hypertension Mother   . Stroke Mother   . Coronary artery disease Mother   . Heart disease Father   . Diabetes Sister   . Hypertension Sister   . Cancer Sister    History   Social History  . Marital Status: Married    Spouse Name: N/A    Number of Children: N/A  . Years of Education: N/A   Social History Main Topics  . Smoking status: Current Everyday Smoker -- 0.5 packs/day for 35 years    Types: Cigarettes  . Smokeless tobacco: None  . Alcohol Use: No  . Drug Use: No  . Sexually Active: None   Other Topics Concern  . None   Social History Narrative   Married, Regular Exercise- yes   Review of Systems: Constitutional: Denies fever, chills, diaphoresis, appetite change and fatigue.  HEENT: Denies photophobia, eye pain, redness, hearing loss, ear pain, congestion, sore throat, rhinorrhea, sneezing, mouth sores, trouble swallowing, neck pain, neck stiffness and tinnitus.   Respiratory: Denies SOB, DOE, cough, chest tightness,  and wheezing.   Cardiovascular: Denies chest pain, palpitations and leg  swelling.  Gastrointestinal: Denies nausea, vomiting, abdominal pain, diarrhea, constipation, blood in stool and abdominal distention.  Genitourinary: Denies dysuria, urgency, frequency, hematuria, flank pain and difficulty urinating.  Musculoskeletal: Denies myalgias, back pain, joint swelling, arthralgias and gait problem.  Skin: Denies pallor, rash and wound.  Neurological: Denies dizziness, seizures, syncope, weakness, light-headedness, numbness and headaches.  Hematological: Denies adenopathy. Easy bruising, personal or family bleeding history  Psychiatric/Behavioral: Denies suicidal ideation, mood changes, confusion, nervousness, sleep disturbance and agitation  Objective:  Physical Exam: Filed Vitals:   10/19/10 1422  BP: 144/91  Pulse: 71  Temp: 98.1 F (36.7 C)  TempSrc: Oral  Height: 5' 4"  (1.626 m)   Weight: 151 lb 1.6 oz (68.539 kg)   Constitutional: Vital signs reviewed.  Patient is a well-developed and well-nourished woman in no acute distress and cooperative with exam. Alert and oriented x3.  Head: Normocephalic and atraumatic Ear: TM normal bilaterally Mouth: no erythema or exudates, MMM Eyes: PERRL, EOMI, conjunctivae normal, No scleral icterus.  Neck: Supple, Trachea midline normal ROM, No JVD, mass, thyromegaly, or carotid bruit present.  Cardiovascular: RRR, S1 normal, S2 normal, no MRG, pulses symmetric and intact bilaterally Pulmonary/Chest: CTAB, no wheezes, rales, or rhonchi Abdominal: Soft. Mild epigastric tenderness to palpation, non-distended, bowel sounds are normal, no masses, organomegaly, or guarding present.  GU: no CVA tenderness Musculoskeletal: No joint deformities, erythema, or stiffness, ROM full and no nontender Hematology: no cervical, inginal, or axillary adenopathy.  Neurological: A&O x3, Strength is normal and symmetric bilaterally, cranial nerve II-XII are grossly intact, no focal motor deficit, sensory intact to light touch bilaterally.  Skin: Warm, dry and intact. No rash, cyanosis, or clubbing.  Psychiatric: Normal mood and affect. speech and behavior is normal. Judgment and thought content normal. Cognition and memory are normal.   Assessment & Plan:

## 2010-10-22 MED ORDER — ATORVASTATIN CALCIUM 20 MG PO TABS: 20.0000 mg | ORAL_TABLET | Freq: Every day | ORAL | Status: DC

## 2010-10-22 NOTE — Assessment & Plan Note (Signed)
Lab Results  Component Value Date   CHOL 276* 10/19/2010   CHOL 255* 01/17/2010   Lab Results  Component Value Date   HDL 59 10/19/2010   HDL 63 01/17/2010   Lab Results  Component Value Date   LDLCALC 189* 10/19/2010   LDLCALC 174* 01/17/2010   Lab Results  Component Value Date   TRIG 138 10/19/2010   TRIG 91 01/17/2010   Lab Results  Component Value Date   CHOLHDL 4.7 10/19/2010   CHOLHDL 4.0 Ratio 01/17/2010   No results found for this basename: LDLDIRECT   She is well above her goal of <130.  She had been on Lipitor before but found it to be expensive.  With her new insurance, which is the same as her husbands it should be much more affordable.  We will have her restart lipitor at 20 mg and recheck her lipid panel in at her next follow up.

## 2010-10-22 NOTE — Progress Notes (Signed)
Pt informed and voices understanding 

## 2010-10-22 NOTE — Assessment & Plan Note (Signed)
She continues to have problems with GERD.  She was taking the medications sequentially instead of together.  We will have her restart the twice daily Pepcid along with the Carafate and then we will follow up to see how she is doing.  She will likely need to continue Pepcid for an extended time but hopefully we can get her off the Carafate soon.

## 2010-10-22 NOTE — Assessment & Plan Note (Signed)
BP Readings from Last 3 Encounters:  10/19/10 144/91  09/14/10 134/89  05/08/10 147/90   Her blood pressure is about the same as before.  She continues to flirt with her goal of <140/90.  We will continue to monitor and encourage her to watch her diet.

## 2010-10-22 NOTE — Assessment & Plan Note (Signed)
She continues to smoke at least 1/2 ppd.  She is contemplating quitting.  We discussed several methods for cutting back and eventually quitting smoking.  She is encouraged to quit as this is the most important thing she can do for her overall health.  We will continue to encourage her at her follow up visit to cut back and eventually quit.

## 2010-11-23 ENCOUNTER — Encounter: Payer: Self-pay | Admitting: Internal Medicine

## 2010-11-23 ENCOUNTER — Ambulatory Visit (INDEPENDENT_AMBULATORY_CARE_PROVIDER_SITE_OTHER): Payer: 59 | Admitting: Internal Medicine

## 2010-11-23 DIAGNOSIS — K219 Gastro-esophageal reflux disease without esophagitis: Secondary | ICD-10-CM

## 2010-11-23 DIAGNOSIS — E785 Hyperlipidemia, unspecified: Secondary | ICD-10-CM

## 2010-11-23 DIAGNOSIS — I10 Essential (primary) hypertension: Secondary | ICD-10-CM

## 2010-11-23 NOTE — Progress Notes (Signed)
Subjective:   Patient ID: Debra Rodgers female   DOB: [birth year] 65 y.o.   MRN: [id number]  HPI: [name] is a 65 y.o. woman who presents today to clinic for follow up from her last visit.  She was prescribed carafate and pepcid at her last visit for her GERD.  She states that she took the medication for a few weeks and then stopped and has had no problems since then. She denies any abdominal pain, nausea, vomiting, diarrhea, constipation, or anorexia.    At her last visit she was also started on Atorvastatin 20 mg to try to decrease her LDL cholesterol.  She has been getting the medication and taking it. She states she has no side effects and specifically denies any darkening of the urine, abdominal pain, yellowing of the skin, or muscle aches.   Past Medical History  Diagnosis Date  . Depression   . History of migraine headaches    Current Outpatient Prescriptions  Medication Sig Dispense Refill  . aspirin 81 MG EC tablet Take 81 mg by mouth daily.        Marland Kitchen atorvastatin (LIPITOR) 20 MG tablet Take 1 tablet (20 mg total) by mouth daily.  30 tablet  11  . bisacodyl (DULCOLAX) 10 MG suppository Place 10 mg rectally 2 (two) times daily as needed. For constipation       . docusate sodium (COLACE) 100 MG capsule TAKE ONE CAPSULE BY MOUTH TWICE A DAY  30 capsule  0  . famotidine (PEPCID) 20 MG tablet Take 1 tablet (20 mg total) by mouth 2 (two) times daily.  60 tablet  6  . FLUoxetine (PROZAC) 20 MG capsule Take 1 capsule (20 mg total) by mouth daily.  90 capsule  3  . potassium chloride SA (K-DUR,KLOR-CON) 20 MEQ tablet Take 20 mEq by mouth daily.        . sucralfate (CARAFATE) 1 G tablet Take 1 tablet (1 g total) by mouth 4 (four) times daily.  120 tablet  1  . triamterene-hydrochlorothiazide (DYAZIDE) 37.5-25 MG per capsule Take 1 capsule by mouth daily.  90 capsule  3   Family History  Problem Relation Age of Onset  . Hypertension Mother   . Stroke Mother   . Coronary artery  disease Mother   . Heart disease Father   . Diabetes Sister   . Hypertension Sister   . Cancer Sister    History   Social History  . Marital Status: Married    Spouse Name: N/A    Number of Children: N/A  . Years of Education: N/A   Social History Main Topics  . Smoking status: Current Everyday Smoker -- 0.5 packs/day for 35 years    Types: Cigarettes  . Smokeless tobacco: None  . Alcohol Use: No  . Drug Use: No  . Sexually Active: None   Other Topics Concern  . None   Social History Narrative   Married, Regular Exercise- yes   Review of Systems: Constitutional: Denies fever, chills, diaphoresis, appetite change and fatigue.  HEENT: Denies photophobia, eye pain, redness, hearing loss, ear pain, congestion, sore throat, rhinorrhea, sneezing, mouth sores, trouble swallowing, neck pain, neck stiffness and tinnitus.   Respiratory: Denies SOB, DOE, cough, chest tightness,  and wheezing.   Cardiovascular: Denies chest pain, palpitations and leg swelling.  Gastrointestinal: Denies nausea, vomiting, abdominal pain, diarrhea, constipation, blood in stool and abdominal distention.  Genitourinary: Denies dysuria, urgency, frequency, hematuria, flank pain and difficulty urinating.  Musculoskeletal: Denies  myalgias, back pain, joint swelling, arthralgias and gait problem.  Skin: Denies pallor, rash and wound.  Neurological: Denies dizziness, seizures, syncope, weakness, light-headedness, numbness and headaches.  Hematological: Denies adenopathy. Easy bruising, personal or family bleeding history  Psychiatric/Behavioral: Denies suicidal ideation, mood changes, confusion, nervousness, sleep disturbance and agitation  Objective:  Physical Exam: Filed Vitals:   11/23/10 1447  BP: 125/82  Pulse: 83  Temp: 99.6 F (37.6 C)  TempSrc: Oral  Height: 5' 4"  (1.626 m)  Weight: 150 lb 11.2 oz (68.357 kg)   Constitutional: Vital signs reviewed.  Patient is a well-developed and  well-nourished woman in no acute distress and cooperative with exam. Alert and oriented x3.  Head: Normocephalic and atraumatic Ear: TM normal bilaterally Mouth: no erythema or exudates, MMM Eyes: PERRL, EOMI, conjunctivae normal, No scleral icterus.  Neck: Supple, Trachea midline normal ROM, No JVD, mass, thyromegaly, or carotid bruit present.  Cardiovascular: RRR, S1 normal, S2 normal, no MRG, pulses symmetric and intact bilaterally Pulmonary/Chest: CTAB, no wheezes, rales, or rhonchi Abdominal: Soft. Mild epigastric tenderness to deep palpation, non-distended, bowel sounds are normal, no masses, organomegaly, or guarding present.  GU: no CVA tenderness Musculoskeletal: No joint deformities, erythema, or stiffness, ROM full and no nontender Hematology: no cervical, inginal, or axillary adenopathy.  Neurological: A&O x3, Strength is normal and symmetric bilaterally, cranial nerve II-XII are grossly intact, no focal motor deficit, sensory intact to light touch bilaterally.  Skin: Warm, dry and intact. No rash, cyanosis, or clubbing.  Psychiatric: Normal mood and affect. speech and behavior is normal. Judgment and thought content normal. Cognition and memory are normal.   Assessment & Plan:

## 2010-11-23 NOTE — Patient Instructions (Signed)
Continue all your medications as prescribed.  You can use the Pepcid or tums for breakthrough stomach pain.  If you have to use the tums every day for more then 2-3 days in a row start the Pepcid.  Follow up with me in November or December to recheck your cholesterol. In the meantime let us know if you need anything else.

## 2010-11-27 NOTE — Assessment & Plan Note (Signed)
BP Readings from Last 3 Encounters:  11/23/10 125/82  10/19/10 144/91  09/14/10 134/89   Her blood pressure today is well controlled on her current regimen.  We will continue to monitor.

## 2010-11-27 NOTE — Assessment & Plan Note (Signed)
Her GERD is much better controlled.  She has been avoiding foods that trigger her abdominal pain.  She was advised that if she does have an increase in pain that she can use Tums or Pepcid OTC to treat it as needed.

## 2010-11-27 NOTE — Assessment & Plan Note (Signed)
Lab Results  Component Value Date   CHOL 276* 10/19/2010   CHOL 255* 01/17/2010   Lab Results  Component Value Date   HDL 59 10/19/2010   HDL 63 01/17/2010   Lab Results  Component Value Date   LDLCALC 189* 10/19/2010   LDLCALC 174* 01/17/2010   Lab Results  Component Value Date   TRIG 138 10/19/2010   TRIG 91 01/17/2010   Lab Results  Component Value Date   CHOLHDL 4.7 10/19/2010   CHOLHDL 4.0 Ratio 01/17/2010   No results found for this basename: LDLDIRECT   She has been taking the lipitor and has no side effects. We will recheck her cholesterol when she follows up in October or November.

## 2011-01-03 LAB — BASIC METABOLIC PANEL
BUN: 18
CO2: 27
Calcium: 9.8
Chloride: 101
Creatinine, Ser: 0.77
GFR calc Af Amer: >60
GFR calc non Af Amer: >60
Glucose, Bld: 84
Potassium: 4
Sodium: 137

## 2011-01-03 LAB — CBC
HCT: 40.7
Hemoglobin: 13.8
MCHC: 33.9
MCV: 97
Platelets: 251
RBC: 4.2
RDW: 13.4
WBC: 5.8

## 2011-03-07 ENCOUNTER — Other Ambulatory Visit (HOSPITAL_COMMUNITY): Payer: Self-pay | Admitting: Internal Medicine

## 2011-03-07 DIAGNOSIS — Z1231 Encounter for screening mammogram for malignant neoplasm of breast: Secondary | ICD-10-CM

## 2011-04-19 ENCOUNTER — Ambulatory Visit (HOSPITAL_COMMUNITY): Payer: 59

## 2011-05-03 ENCOUNTER — Ambulatory Visit (INDEPENDENT_AMBULATORY_CARE_PROVIDER_SITE_OTHER): Payer: 59 | Admitting: Internal Medicine

## 2011-05-03 ENCOUNTER — Encounter: Payer: Self-pay | Admitting: Internal Medicine

## 2011-05-03 VITALS — BP 149/90 | HR 71 | Temp 98.5°F | Ht 62.0 in | Wt 152.6 lb

## 2011-05-03 DIAGNOSIS — Z299 Encounter for prophylactic measures, unspecified: Secondary | ICD-10-CM

## 2011-05-03 DIAGNOSIS — F329 Major depressive disorder, single episode, unspecified: Secondary | ICD-10-CM

## 2011-05-03 DIAGNOSIS — G5600 Carpal tunnel syndrome, unspecified upper limb: Secondary | ICD-10-CM

## 2011-05-03 DIAGNOSIS — Z23 Encounter for immunization: Secondary | ICD-10-CM

## 2011-05-03 DIAGNOSIS — I10 Essential (primary) hypertension: Secondary | ICD-10-CM

## 2011-05-03 DIAGNOSIS — E785 Hyperlipidemia, unspecified: Secondary | ICD-10-CM

## 2011-05-03 NOTE — Progress Notes (Signed)
Subjective:   Patient ID: Debra Rodgers female   DOB: [birth year] 66 y.o.   MRN: [id number]  HPI: [name] is a 66 y.o. woman who presents to clinic today with several complaints as well as follow up from her last appointment.   She has had dull, achy right hip pain for 1 week.  She states that she was cleaning the bathroom and was squatting down to put stuff under the sink when she first felt it.  It is worse when walking.  She is able to lay on it without increase in pain.  She notices it more when she has to move her leg outward. She denies radiation down her lag.   She states that she woke this morning with a sore throat.  Last Sunday prior to her visit she had a day or two of sneezing and coughing that was helped with cold medications.  The pain is bilateral in her throat and is worse when she swallows.   She also has been having problems with headaches last week.  The pain is worse in the back of the head and is worse in the morning.  She states that once she is up and walking around she does better.  She denies any aura, light sensitivity, nausea, or vomiting.  She also states that she has not been sleeping well lately.  She has had a lot of friends and family pass away lately and she has been not sleeping well since then and that she has been feeling down.  She has not tried anything for her sleep in the past.  She denies SI or HI but states that she has been down and less interesting in doing her normal daily activities lately.    She has also noticed numbness and tingling in both of her hands for the last few months. She states that it occasionally wakes her at night.  She denies weakness, neck pain, or loss of bowel or bladder control.  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    She is also due for a check of her lipids.  She has been on Lipitor for sometime and has been tolerating it well.  She states she takes it  every evening.   Past Medical History  Diagnosis Date  . Depression   . History of migraine headaches    Current Outpatient Prescriptions  Medication Sig Dispense Refill  . aspirin 81 MG EC tablet Take 81 mg by mouth daily.        Marland Kitchen atorvastatin (LIPITOR) 20 MG tablet Take 1 tablet (20 mg total) by mouth daily.  30 tablet  11  . bisacodyl (DULCOLAX) 10 MG suppository Place 10 mg rectally 2 (two) times daily as needed. For constipation       . docusate sodium (COLACE) 100 MG capsule TAKE ONE CAPSULE BY MOUTH TWICE A DAY  30 capsule  0  . FLUoxetine (PROZAC) 20 MG capsule Take 1 capsule (20 mg total) by mouth daily.  90 capsule  3  . triamterene-hydrochlorothiazide (DYAZIDE) 37.5-25 MG per capsule Take 1 capsule by mouth daily.  90 capsule  3   Family History  Problem Relation Age of Onset  . Hypertension Mother   . Stroke Mother   . Coronary artery disease Mother   . Heart disease Father   . Diabetes Sister   . Hypertension Sister   . Cancer Sister  History   Social History  . Marital Status: Married    Spouse Name: N/A    Number of Children: N/A  . Years of Education: N/A   Social History Main Topics  . Smoking status: Current Everyday Smoker -- 0.5 packs/day for 35 years    Types: Cigarettes  . Smokeless tobacco: None  . Alcohol Use: No  . Drug Use: No  . Sexually Active: None   Other Topics Concern  . None   Social History Narrative   Married, Regular Exercise- yes   Review of Systems: Negative except as noted in the HPI.   Objective:  Physical Exam: Filed Vitals:   05/03/11 1400  BP: 149/90  Pulse: 71  Temp: 98.5 F (36.9 C)  TempSrc: Oral  Height: 5' 2"  (1.575 m)  Weight: 152 lb 9.6 oz (69.219 kg)   Constitutional: Vital signs reviewed.  Patient is a well-developed and well-nourished woman in no acute distress and cooperative with exam. Alert and oriented x3.  Head: Normocephalic and atraumatic Ear: TM normal bilaterally Mouth: no erythema or  exudates, MMM Eyes: PERRL, EOMI, conjunctivae normal, No scleral icterus.  Neck: Supple, Trachea midline normal ROM, No JVD, mass, thyromegaly, or carotid bruit present.  Cardiovascular: RRR, S1 normal, S2 normal, no MRG, pulses symmetric and intact bilaterally Pulmonary/Chest: CTAB, no wheezes, rales, or rhonchi Abdominal: Soft. Non-tender, non-distended, bowel sounds are normal, no masses, organomegaly, or guarding present.  GU: no CVA tenderness Musculoskeletal: No joint deformities, erythema, or stiffness, ROM full and no nontender Hematology: no cervical, inginal, or axillary adenopathy.  Neurological: A&O x3, Strength is normal and symmetric bilaterally in the upper and lower extremities.  There is a positive Tinnel's and median nerve compression test bilaterally with L>R.  cranial nerve II-XII are grossly intact, no focal motor deficit, sensory intact to light touch bilaterally.  Skin: Warm, dry and intact. No rash, cyanosis, or clubbing.  Psychiatric: Normal mood and flat affect. Speech not rushed and behavior is normal. Judgment and thought content normal. Cognition and memory are normal.   Assessment & Plan:

## 2011-05-03 NOTE — Patient Instructions (Signed)
1.  Start Benadryl 25 mg tablets take 2 tablets at bedtime for your sleep.  2.  Pick up the wrist splints and wear those at night to help with the numbness and tingling in your hands.  -  If you notice any weakness or problems with your bowels or bladder please call right away.  3.  Continue all of your other medications as prescribed.  4.  Stop in the lab to have your blood drawn.  If we need to follow up on anything I will call you.  5.  Follow up in 1-2 months to see how you are doing.  If your not able to sleep you can call before then.

## 2011-05-04 LAB — LIPID PANEL
Cholesterol: 178 mg/dL (ref 0–200)
HDL: 66 mg/dL (ref >39)
LDL Cholesterol: 93 mg/dL (ref 0–99)
Total CHOL/HDL Ratio: 2.7 Ratio
Triglycerides: 94 mg/dL (ref <150)
VLDL: 19 mg/dL (ref 0–40)

## 2011-05-04 LAB — COMPREHENSIVE METABOLIC PANEL
ALT: 10 U/L (ref 0–35)
AST: 16 U/L (ref 0–37)
Albumin: 4.3 g/dL (ref 3.5–5.2)
Alkaline Phosphatase: 78 U/L (ref 39–117)
BUN: 23 mg/dL (ref 6–23)
CO2: 27 mEq/L (ref 19–32)
Calcium: 9.9 mg/dL (ref 8.4–10.5)
Chloride: 102 mEq/L (ref 96–112)
Creat: 0.85 mg/dL (ref 0.50–1.10)
Glucose, Bld: 79 mg/dL (ref 70–99)
Potassium: 3.8 mEq/L (ref 3.5–5.3)
Sodium: 140 mEq/L (ref 135–145)
Total Bilirubin: 0.6 mg/dL (ref 0.3–1.2)
Total Protein: 6.9 g/dL (ref 6.0–8.3)

## 2011-05-17 ENCOUNTER — Ambulatory Visit (HOSPITAL_COMMUNITY)
Admission: RE | Admit: 2011-05-17 | Discharge: 2011-05-17 | Disposition: A | Discharge status: Home / Self Care | Payer: 59 | Source: Ambulatory Visit | Attending: Internal Medicine | Admitting: Internal Medicine

## 2011-05-17 ENCOUNTER — Other Ambulatory Visit: Payer: Self-pay | Admitting: Internal Medicine

## 2011-05-17 DIAGNOSIS — Z1231 Encounter for screening mammogram for malignant neoplasm of breast: Secondary | ICD-10-CM

## 2011-06-25 ENCOUNTER — Other Ambulatory Visit: Payer: Self-pay | Admitting: Internal Medicine

## 2011-06-26 ENCOUNTER — Other Ambulatory Visit: Payer: Self-pay | Admitting: *Deleted

## 2011-06-26 MED ORDER — TRIAMTERENE-HCTZ 37.5-25 MG PO CAPS: 1.0000 | ORAL_CAPSULE | Freq: Every day | ORAL | Status: DC

## 2011-06-30 NOTE — Assessment & Plan Note (Signed)
She has evidence of bilateral carpal tunnel syndrome.  There is some concern that this is coming from her neck with her history of cervical disc disease but she has no other sensory defects.  She has no weakness that I noted today.  We will plan on her getting bilateral wrist splints to help protect her wrists and follow up.  If she continues to have problems we could consider injection therapy or possible referral for EMG.

## 2011-06-30 NOTE — Assessment & Plan Note (Signed)
Lab Results  Component Value Date   NA 140 05/03/2011   K 3.8 05/03/2011   CL 102 05/03/2011   CO2 27 05/03/2011   BUN 23 05/03/2011   CREATININE 0.85 05/03/2011   CREATININE 0.75 01/17/2010    BP Readings from Last 3 Encounters:  05/03/11 149/90  11/23/10 125/82  10/19/10 144/91    Assessment: Hypertension control:  mildly elevated  Progress toward goals:  deteriorated Barriers to meeting goals:  no barriers identified  Plan: Hypertension treatment:  continue current medications We discussed low salt diet and continuing to take her medications as directed.  We will follow up at her next appointment and if she continues to be high we will adjust her medications.

## 2011-06-30 NOTE — Assessment & Plan Note (Signed)
She has been on Prozac and is encouraged to continue this.  We discussed several options to help her rest and will start with OTC benadryl and see how she does.  I told her that this is likely an exacerbation since she had some recent losses and that it should improve.  The next step would be something like Trazodone, ambien, or Amitriptyline.

## 2011-06-30 NOTE — Assessment & Plan Note (Signed)
Lab Results  Component Value Date   CHOL 178 05/03/2011   CHOL 276* 10/19/2010   CHOL 255* 01/17/2010   Lab Results  Component Value Date   HDL 66 05/03/2011   HDL 59 10/19/2010   HDL 63 01/17/2010   Lab Results  Component Value Date   LDLCALC 93 05/03/2011   LDLCALC 189* 10/19/2010   LDLCALC 174* 01/17/2010   Lab Results  Component Value Date   TRIG 94 05/03/2011   TRIG 138 10/19/2010   TRIG 91 01/17/2010   Lab Results  Component Value Date   CHOLHDL 2.7 05/03/2011   CHOLHDL 4.7 10/19/2010   CHOLHDL 4.0 Ratio 01/17/2010   No results found for this basename: LDLDIRECT   Her LDL is 93 which is below her goal.  We will continue to monitor on a yearly basis.

## 2011-07-05 ENCOUNTER — Ambulatory Visit (INDEPENDENT_AMBULATORY_CARE_PROVIDER_SITE_OTHER): Payer: 59 | Admitting: Internal Medicine

## 2011-07-05 ENCOUNTER — Encounter: Payer: Self-pay | Admitting: Internal Medicine

## 2011-07-05 VITALS — BP 132/77 | HR 85 | Temp 98.4°F | Wt 151.7 lb

## 2011-07-05 DIAGNOSIS — F329 Major depressive disorder, single episode, unspecified: Secondary | ICD-10-CM

## 2011-07-05 DIAGNOSIS — G5600 Carpal tunnel syndrome, unspecified upper limb: Secondary | ICD-10-CM

## 2011-07-05 DIAGNOSIS — I10 Essential (primary) hypertension: Secondary | ICD-10-CM

## 2011-07-05 DIAGNOSIS — F17200 Nicotine dependence, unspecified, uncomplicated: Secondary | ICD-10-CM

## 2011-07-05 MED ORDER — AMITRIPTYLINE HCL 25 MG PO TABS: 25.0000 mg | ORAL_TABLET | Freq: Every day | ORAL | Status: DC

## 2011-07-05 NOTE — Patient Instructions (Signed)
1.  Start Amitriptyline 25 mg tablets.  Take 1 tablet before bedtime to help you sleep.  2.  Pick up the wrist splints for both hands and wear them at bedtime.  3.  Work on cutting back the smoking!  - Set a place to smoke.  Make it a place that you have to go to smoke.  - Work on delaying the hardest cigarette every day starting with 30 minutes and getting longer every few days.  4.  Follow up in 2-4 weeks to see how the sleeping medicine is working.  Call me if you notice no difference in about 1 week.

## 2011-07-05 NOTE — Progress Notes (Signed)
Subjective:   Patient ID: Debra Rodgers female   DOB: 1945-05-05 66 y.o.   MRN: [id number]  HPI: [name] is a 66 y.o. woman who presents to clinic today for follow up from her last appointment.  She states that she is taking her medications as directed.   She states that her blood pressure has been running better since her last appointment.  She takes it when she goes to [facility] or [facility] and states that it consistently runs under 140 and 90. She denies any headaches, nausea, vomiting, chest tightness, or blurry vision.    She states that her mood is still down even after being on the Prozac for sometime.  She has not been taking anything for her sleep and she states that it is better then it was before but still not great.  She states that she tries to go to bed about 11 or 11:30 and is usually up about 7 am.  She is awake 2-3 times at night and it usually takes her over an hour to get to sleep.  We had discussed several options at her last appointment and she states that she would like to try one.    She continues to have problems with numbness and tingling in both of her hands usually worse at night.  She denies weakness or problems during the day unless she is typing a lot.  She did not pick up the wrist splints after her last appointment.  She continues to smoke about 1 ppd.  She has tried to cut back but still is struggling with it.  She has thought about quitting but has never tried anything to help her quit.    Past Medical History  Diagnosis Date  . Depression   . History of migraine headaches    Current Outpatient Prescriptions  Medication Sig Dispense Refill  . aspirin 81 MG EC tablet Take 81 mg by mouth daily.        Marland Kitchen atorvastatin (LIPITOR) 20 MG tablet Take 1 tablet (20 mg total) by mouth daily.  30 tablet  11  . famotidine (PEPCID) 20 MG tablet Take 20 mg by mouth at bedtime as needed.      Marland Kitchen FLUoxetine (PROZAC) 20 MG capsule TAKE 1 CAPSULE BY MOUTH ONCE DAILY  30  capsule  3  . triamterene-hydrochlorothiazide (DYAZIDE) 37.5-25 MG per capsule Take 1 each (1 capsule total) by mouth daily.  90 capsule  3   Family History  Problem Relation Age of Onset  . Hypertension Mother   . Stroke Mother   . Coronary artery disease Mother   . Heart disease Father   . Diabetes Sister   . Hypertension Sister   . Cancer Sister    History   Social History  . Marital Status: Married    Spouse Name: N/A    Number of Children: N/A  . Years of Education: N/A   Social History Main Topics  . Smoking status: Current Everyday Smoker -- 0.7 packs/day for 35 years    Types: Cigarettes  . Smokeless tobacco: None  . Alcohol Use: No  . Drug Use: No  . Sexually Active: None   Other Topics Concern  . None   Social History Narrative   Married, Regular Exercise- yes   Review of Systems: Constitutional: Denies fever, chills, diaphoresis, appetite change and fatigue.  HEENT: Denies photophobia, eye pain, redness, hearing loss, ear pain, congestion, sore throat, rhinorrhea, sneezing, mouth sores, trouble swallowing, neck pain, neck stiffness  and tinnitus.   Respiratory: Denies SOB, DOE, cough, chest tightness,  and wheezing.   Cardiovascular: Denies chest pain, palpitations and leg swelling.  Gastrointestinal: Denies nausea, vomiting, abdominal pain, diarrhea, constipation, blood in stool and abdominal distention.  Genitourinary: Denies dysuria, urgency, frequency, hematuria, flank pain and difficulty urinating.  Musculoskeletal: Denies myalgias, back pain, joint swelling, arthralgias and gait problem.  Skin: Denies pallor, rash and wound.  Neurological: Denies dizziness, seizures, syncope, weakness, light-headedness, numbness and headaches.  Hematological: Denies adenopathy. Easy bruising, personal or family bleeding history  Psychiatric/Behavioral: Denies suicidal ideation, mood changes, confusion, nervousness, sleep disturbance and agitation  Objective:  Physical  Exam: Filed Vitals:   07/05/11 1410  BP: 132/77  Pulse: 85  Temp: 98.4 F (36.9 C)  TempSrc: Oral  Weight: 151 lb 11.2 oz (68.811 kg)   Constitutional: Vital signs reviewed.  Patient is a well-developed and well-nourished woman in no acute distress and cooperative with exam. Alert and oriented x3.  Head: Normocephalic and atraumatic Ear: TM normal bilaterally Mouth: no erythema or exudates, MMM Eyes: PERRL, EOMI, conjunctivae normal, No scleral icterus.  Neck: Supple, Trachea midline normal ROM, No JVD, mass, thyromegaly, or carotid bruit present.  Cardiovascular: RRR, S1 normal, S2 normal, no MRG, pulses symmetric and intact bilaterally Pulmonary/Chest: CTAB, no wheezes, rales, or rhonchi Abdominal: Soft. Non-tender, non-distended, bowel sounds are normal, no masses, organomegaly, or guarding present.  GU: no CVA tenderness Musculoskeletal: median nerve compression test positive bilaterally.  Tinnel's sign positive over the bilateral wrist.  No thenar wasting noted.  No joint deformities, erythema, or stiffness, ROM full and no nontender Hematology: no cervical, inginal, or axillary adenopathy.  Neurological: A&O x3, Strength is normal and symmetric bilaterally, cranial nerve II-XII are grossly intact, no focal motor deficit, sensory intact to light touch bilaterally.  Skin: Warm, dry and intact. No rash, cyanosis, or clubbing.  Psychiatric: Normal mood and flat affect. speech and behavior is normal. Judgment and thought content normal. Cognition and memory are normal.   Assessment & Plan:

## 2011-08-24 NOTE — Assessment & Plan Note (Signed)
We will start her on Amitriptyline 25 mg qhs today to see if we can help her augment her prozac and help her sleep some.  We will continue to monitor.  We discussed today about grief counseling and she was given resources for mental health in the area.

## 2011-08-24 NOTE — Assessment & Plan Note (Signed)
We discussed several cessation strategies today.  She is going to work on cutting back and then setting a quit date.

## 2011-08-24 NOTE — Assessment & Plan Note (Signed)
We will have her pick up the bilateral wrist splints and wear them at night to see if that helps her feel better.  If she does not improve with these we will likely have to send her for EMG and consider injection therapy.

## 2011-08-24 NOTE — Assessment & Plan Note (Signed)
Lab Results  Component Value Date   NA 140 05/03/2011   K 3.8 05/03/2011   CL 102 05/03/2011   CO2 27 05/03/2011   BUN 23 05/03/2011   CREATININE 0.85 05/03/2011   CREATININE 0.75 01/17/2010    BP Readings from Last 3 Encounters:  07/05/11 132/77  05/03/11 149/90  11/23/10 125/82    Assessment: Hypertension control:  controlled  Progress toward goals:  at goal Barriers to meeting goals:  no barriers identified  Plan: Hypertension treatment:  continue current medications

## 2011-09-26 ENCOUNTER — Other Ambulatory Visit: Payer: Self-pay | Admitting: Internal Medicine

## 2011-10-23 ENCOUNTER — Other Ambulatory Visit: Payer: Self-pay | Admitting: Internal Medicine

## 2011-10-24 ENCOUNTER — Encounter: Payer: Self-pay | Admitting: Internal Medicine

## 2011-10-24 ENCOUNTER — Ambulatory Visit (INDEPENDENT_AMBULATORY_CARE_PROVIDER_SITE_OTHER): Payer: 59 | Admitting: Internal Medicine

## 2011-10-24 VITALS — BP 121/75 | HR 79 | Temp 98.8°F | Ht 64.0 in | Wt 153.5 lb

## 2011-10-24 DIAGNOSIS — T148 Other injury of unspecified body region: Secondary | ICD-10-CM

## 2011-10-24 DIAGNOSIS — F329 Major depressive disorder, single episode, unspecified: Secondary | ICD-10-CM

## 2011-10-24 DIAGNOSIS — E785 Hyperlipidemia, unspecified: Secondary | ICD-10-CM

## 2011-10-24 DIAGNOSIS — G5600 Carpal tunnel syndrome, unspecified upper limb: Secondary | ICD-10-CM

## 2011-10-24 DIAGNOSIS — R51 Headache: Secondary | ICD-10-CM

## 2011-10-24 DIAGNOSIS — I10 Essential (primary) hypertension: Secondary | ICD-10-CM

## 2011-10-24 DIAGNOSIS — W57XXXA Bitten or stung by nonvenomous insect and other nonvenomous arthropods, initial encounter: Secondary | ICD-10-CM

## 2011-10-24 DIAGNOSIS — G44209 Tension-type headache, unspecified, not intractable: Secondary | ICD-10-CM | POA: Insufficient documentation

## 2011-10-24 DIAGNOSIS — R519 Headache, unspecified: Secondary | ICD-10-CM

## 2011-10-24 MED ORDER — DIPHENHYDRAMINE HCL 25 MG PO CAPS: 25.0000 mg | ORAL_CAPSULE | Freq: Every evening | ORAL | Status: DC | PRN

## 2011-10-24 NOTE — Patient Instructions (Addendum)
Please keep a blood pressure log. Record you blood pressures once twice a week and during "hot flushing" episodes and continue you blood pressure medication. Please bring your log with you on your next clinic appointment on 11/15/11 at 2:15pm.  Continue to take the Benadryl before bed to help with your sinus drainage and headaches.  Continue to use neosporin on your bug bite site and cover with a Band-Aid to prevent scratching the site- Please call the clinic if your leg has worsening redness or increased drainage.    Sinus Headache A sinus headache is when your sinuses become clogged or swollen. Sinus headaches can range from mild to severe.   CAUSES A sinus headache can have different causes, such as:  Colds.   Sinus infections.   Allergies.  SYMPTOMS   Symptoms of a sinus headache may vary and can include:  Headache.   Pain or pressure in the face.   Congested or runny nose.   Fever.   Inability to smell.   Pain in upper teeth.  Weather changes can make symptoms worse. TREATMENT   The treatment of a sinus headache depends on the cause.  Sinus pain caused by a sinus infection may be treated with antibiotic medicine.   Sinus pain caused by allergies may be helped by allergy medicines (antihistamines) and medicated nasal sprays.   Sinus pain caused by congestion may be helped by flushing the nose and sinuses with saline solution.  HOME CARE INSTRUCTIONS    If antibiotics are prescribed, take them as directed. Finish them even if you start to feel better.   Only take over-the-counter or prescription medicines for pain, discomfort, or fever as directed by your caregiver.   If you have congestion, use a nasal spray to help reduce pressure.  SEEK IMMEDIATE MEDICAL CARE IF:  You have a fever.   You have headaches more than once a week.   You have sensitivity to light or sound.   You have repeated nausea and vomiting.   You have vision problems.   You have sudden,  severe pain in your face or head.   You have a seizure.   You are confused.   Your sinus headaches do not get better after treatment. Many people think they have a sinus headache when they actually have migraines or tension headaches.  MAKE SURE YOU:    Understand these instructions.   Will watch your condition.   Will get help right away if you are not doing well or get worse.  Document Released: 04/18/2004 Document Revised: 02/28/2011 Document Reviewed: 06/09/2010 [facility] Patient Information 2012 [place].Headaches, Frequently Asked Questions MIGRAINE HEADACHES Q: What is migraine? What causes it? How can I treat it? A: Generally, migraine headaches begin as a dull ache. Then they develop into a constant, throbbing, and pulsating pain. You may experience pain at the temples. You may experience pain at the front or back of one or both sides of the head. The pain is usually accompanied by a combination of:  Nausea.   Vomiting.   Sensitivity to light and noise.  Some people (about 15%) experience an aura (see below) before an attack. The cause of migraine is believed to be chemical reactions in the brain. Treatment for migraine may include over-the-counter or prescription medications. It may also include self-help techniques. These include relaxation training and biofeedback.   Q: What is an aura? A: About 15% of people with migraine get an "aura". This is a sign of neurological symptoms  that occur before a migraine headache. You may see wavy or jagged lines, dots, or flashing lights. You might experience tunnel vision or blind spots in one or both eyes. The aura can include visual or auditory hallucinations (something imagined). It may include disruptions in smell (such as strange odors), taste or touch. Other symptoms include:  Numbness.   A "pins and needles" sensation.   Difficulty in recalling or speaking the correct word.  These neurological events may last as long as  60 minutes. These symptoms will fade as the headache begins. Q: What is a trigger? A: Certain physical or environmental factors can lead to or "trigger" a migraine. These include:  Foods.   Hormonal changes.   Weather.   Stress.  It is important to remember that triggers are different for everyone. To help prevent migraine attacks, you need to figure out which triggers affect you. Keep a headache diary. This is a good way to track triggers. The diary will help you talk to your healthcare professional about your condition. Q: Does weather affect migraines? A: Bright sunshine, hot, humid conditions, and drastic changes in barometric pressure may lead to, or "trigger," a migraine attack in some people. But studies have shown that weather does not act as a trigger for everyone with migraines. Q: What is the link between migraine and hormones? A: Hormones start and regulate many of your body's functions. Hormones keep your body in balance within a constantly changing environment. The levels of hormones in your body are unbalanced at times. Examples are during menstruation, pregnancy, or menopause. That can lead to a migraine attack. In fact, about three quarters of all women with migraine report that their attacks are related to the menstrual cycle.   Q: Is there an increased risk of stroke for migraine sufferers? A: The likelihood of a migraine attack causing a stroke is very remote. That is not to say that migraine sufferers cannot have a stroke associated with their migraines. In persons under age 76, the most common associated factor for stroke is migraine headache. But over the course of a person's normal life span, the occurrence of migraine headache may actually be associated with a reduced risk of dying from cerebrovascular disease due to stroke.   Q: What are acute medications for migraine? A: Acute medications are used to treat the pain of the headache after it has started. Examples  over-the-counter medications, NSAIDs, ergots, and triptans.   Q: What are the triptans? A: Triptans are the newest class of abortive medications. They are specifically targeted to treat migraine. Triptans are vasoconstrictors. They moderate some chemical reactions in the brain. The triptans work on receptors in your brain. Triptans help to restore the balance of a neurotransmitter called serotonin. Fluctuations in levels of serotonin are thought to be a main cause of migraine.   Q: Are over-the-counter medications for migraine effective? A: Over-the-counter, or "OTC," medications may be effective in relieving mild to moderate pain and associated symptoms of migraine. But you should see your caregiver before beginning any treatment regimen for migraine.   Q: What are preventive medications for migraine? A: Preventive medications for migraine are sometimes referred to as "prophylactic" treatments. They are used to reduce the frequency, severity, and length of migraine attacks. Examples of preventive medications include antiepileptic medications, antidepressants, beta-blockers, calcium channel blockers, and NSAIDs (nonsteroidal anti-inflammatory drugs). Q: Why are anticonvulsants used to treat migraine? A: During the past few years, there has been an increased interest in antiepileptic drugs  for the prevention of migraine. They are sometimes referred to as "anticonvulsants". Both epilepsy and migraine may be caused by similar reactions in the brain.   Q: Why are antidepressants used to treat migraine? A: Antidepressants are typically used to treat people with depression. They may reduce migraine frequency by regulating chemical levels, such as serotonin, in the brain.   Q: What alternative therapies are used to treat migraine? A: The term "alternative therapies" is often used to describe treatments considered outside the scope of conventional Western medicine. Examples of alternative therapy include  acupuncture, acupressure, and yoga. Another common alternative treatment is herbal therapy. Some herbs are believed to relieve headache pain. Always discuss alternative therapies with your caregiver before proceeding. Some herbal products contain arsenic and other toxins. TENSION HEADACHES Q: What is a tension-type headache? What causes it? How can I treat it? A: Tension-type headaches occur randomly. They are often the result of temporary stress, anxiety, fatigue, or anger. Symptoms include soreness in your temples, a tightening band-like sensation around your head (a "vice-like" ache). Symptoms can also include a pulling feeling, pressure sensations, and contracting head and neck muscles. The headache begins in your forehead, temples, or the back of your head and neck. Treatment for tension-type headache may include over-the-counter or prescription medications. Treatment may also include self-help techniques such as relaxation training and biofeedback. CLUSTER HEADACHES Q: What is a cluster headache? What causes it? How can I treat it? A: Cluster headache gets its name because the attacks come in groups. The pain arrives with little, if any, warning. It is usually on one side of the head. A tearing or bloodshot eye and a runny nose on the same side of the headache may also accompany the pain. Cluster headaches are believed to be caused by chemical reactions in the brain. They have been described as the most severe and intense of any headache type. Treatment for cluster headache includes prescription medication and oxygen. SINUS HEADACHES Q: What is a sinus headache? What causes it? How can I treat it? A: When a cavity in the bones of the face and skull (a sinus) becomes inflamed, the inflammation will cause localized pain. This condition is usually the result of an allergic reaction, a tumor, or an infection. If your headache is caused by a sinus blockage, such as an infection, you will probably have a  fever. An x-ray will confirm a sinus blockage. Your caregiver's treatment might include antibiotics for the infection, as well as antihistamines or decongestants.   REBOUND HEADACHES Q: What is a rebound headache? What causes it? How can I treat it? A: A pattern of taking acute headache medications too often can lead to a condition known as "rebound headache." A pattern of taking too much headache medication includes taking it more than 2 days per week or in excessive amounts. That means more than the label or a caregiver advises. With rebound headaches, your medications not only stop relieving pain, they actually begin to cause headaches. Doctors treat rebound headache by tapering the medication that is being overused. Sometimes your caregiver will gradually substitute a different type of treatment or medication. Stopping may be a challenge. Regularly overusing a medication increases the potential for serious side effects. Consult a caregiver if you regularly use headache medications more than 2 days per week or more than the label advises. ADDITIONAL QUESTIONS AND ANSWERS Q: What is biofeedback? A: Biofeedback is a self-help treatment. Biofeedback uses special equipment to monitor your body's involuntary physical  responses. Biofeedback monitors:  Breathing.   Pulse.   Heart rate.   Temperature.   Muscle tension.   Brain activity.  Biofeedback helps you refine and perfect your relaxation exercises. You learn to control the physical responses that are related to stress. Once the technique has been mastered, you do not need the equipment any more. Q: Are headaches hereditary? A: Four out of five (80%) of people that suffer report a family history of migraine. Scientists are not sure if this is genetic or a family predisposition. Despite the uncertainty, a child has a 50% chance of having migraine if one parent suffers. The child has a 75% chance if both parents suffer.   Q: Can children get  headaches? A: By the time they reach high school, most young people have experienced some type of headache. Many safe and effective approaches or medications can prevent a headache from occurring or stop it after it has begun.   Q: What type of doctor should I see to diagnose and treat my headache? A: Start with your primary caregiver. Discuss his or her experience and approach to headaches. Discuss methods of classification, diagnosis, and treatment. Your caregiver may decide to recommend you to a headache specialist, depending upon your symptoms or other physical conditions. Having diabetes, allergies, etc., may require a more comprehensive and inclusive approach to your headache. The National Headache Foundation will provide, upon request, a list of [facility] physician members in your state. Document Released: 06/01/2003 Document Revised: 02/28/2011 Document Reviewed: 11/09/2007 [facility] Patient Information 2012 [place].

## 2011-10-24 NOTE — Progress Notes (Signed)
Patient ID: Debra Rodgers, female   DOB: October 18, 1945, 66 y.o.   MRN: [id number]  Subjective:   Patient ID: Debra Rodgers female   DOB: 1945/10/16 66 y.o.   MRN: [id number]  HPI: [name] is a 66 y.o. F with a PMH of HTN, HLD, Depression who presents to the clinic for concerns with her blood pressure. She states that over the past week she has been having episodes of feeling "very hot", sweaty, drowsy, and just not feeling well. She notes that these episodes have been increasing in frequency. On the day prior to her clinic visit (7/31) she had a similar episode and decided to check her blood pressure, which was elevated to 167/111 and on repeat 157/90s. She endorses compliance with her medications. While in the exam room she endorsed having a similar episode, and felt very hot even though the room was cool. Her blood pressure was very good, 121/75. She states that these sx are worse than when she has hot flashes- her last hot flash was 1 month ago. She does endorse increased stress related to her work.  She also endorses an aching headache that has been occuring for the past month. It starts in the back of her head and migrates to the forehead and at times to her temples. She endorses a 1 month h/o a productive cough with clear drainage and clear drainage from her nose. Aleve helps relieve the pain.   She feels that her mood has been good overall. She does endorse some bad days but is able to change her focus and her mood improves. She endorses compliance with her Prozac. She says her sleep has improved, but she is only sleeping 6hrs max at night, staying up until midnight or 12:30am and waking at 6am at the latest. She is not taking the Amitriptyline because it made her very drowsy, but she says see still has it in case her sleep worsens.  Her carpel tunnel syndrome is doing well, and she denies any problems related to it recently. She never picked up the wrist braces for her CTS, but states that she  does not need them.  She states that she was bitten on her left lateral thigh by something last week. She scratched it and it scabbed over. Unfortunatley, she continued to scratch it and it developed drainage and erythema. She was using Neosporin, which she says has improved the site.   Past Medical History  Diagnosis Date  . Depression   . History of migraine headaches    Current Outpatient Prescriptions  Medication Sig Dispense Refill  . aspirin 81 MG EC tablet Take 81 mg by mouth daily.        Marland Kitchen atorvastatin (LIPITOR) 20 MG tablet Take 1 tablet (20 mg total) by mouth daily.  30 tablet  11  . famotidine (PEPCID) 20 MG tablet Take 20 mg by mouth at bedtime as needed.      Marland Kitchen FLUoxetine (PROZAC) 20 MG capsule TAKE 1 CAPSULE BY MOUTH ONCE DAILY  30 capsule  3  . naproxen sodium (ANAPROX) 220 MG tablet Take 220 mg by mouth as needed.      . triamterene-hydrochlorothiazide (DYAZIDE) 37.5-25 MG per capsule Take 1 each (1 capsule total) by mouth daily.  90 capsule  3  . amitriptyline (ELAVIL) 25 MG tablet Take 1 tablet (25 mg total) by mouth at bedtime.  30 tablet  6  . diphenhydrAMINE (BENADRYL) 25 mg capsule Take 1 capsule (25 mg total) by mouth at  bedtime as needed for itching or allergies.  24 capsule  2   Family History  Problem Relation Age of Onset  . Hypertension Mother   . Stroke Mother   . Coronary artery disease Mother   . Heart disease Father   . Diabetes Sister   . Hypertension Sister   . Cancer Sister    History   Social History  . Marital Status: Married    Spouse Name: N/A    Number of Children: N/A  . Years of Education: N/A   Social History Main Topics  . Smoking status: Current Everyday Smoker -- 0.5 packs/day for 35 years    Types: Cigarettes  . Smokeless tobacco: Not on file  . Alcohol Use: No  . Drug Use: No  . Sexually Active: Not on file   Other Topics Concern  . Not on file   Social History Narrative   Married, Regular Exercise- yes   Review of  Systems: Constitutional: Endorses diaphoresis w/ episodes per above. Denies fever, chills, appetite change or fatigue.  HEENT: Endorses headache, nasal drainage. Denies photophobia, eye pain, redness, hearing loss, ear pain, sore throat, sneezing, mouth sores, trouble swallowing, neck pain, neck stiffness or tinnitus.   Respiratory: Endorses mild productive cough w/ clear drainage. Denies SOB, DOE, chest tightness,  or wheezing.   Cardiovascular: Denies chest pain, palpitations or leg swelling.  Gastrointestinal: Denies nausea, vomiting, abdominal pain, diarrhea, constipation, blood in stool or abdominal distention.  Genitourinary: Denies dysuria, urgency, frequency, hematuria, flank pain or difficulty urinating.  Musculoskeletal: Denies myalgias, back pain, joint swelling, arthralgias or gait problem.  Skin: Recent "bug bite". Denies pallor or rash  Neurological: Denies dizziness, seizures, syncope, weakness, or numbness  Hematological: Denies adenopathy, easy bruising, personal or family bleeding history  Psychiatric/Behavioral: Denies suicidal ideation, mood changes, confusion, nervousness, sleep disturbance or agitation  Objective:  Physical Exam: Filed Vitals:   10/24/11 1517  BP: 121/75  Pulse: 79  Temp: 98.8 F (37.1 C)  TempSrc: Oral  Height: 5' 4"  (1.626 m)  Weight: 153 lb 8 oz (69.627 kg)   Constitutional: Vital signs reviewed.  Patient is a well-developed and well-nourished female in no acute distress and cooperative with exam. Alert and oriented x3.  Head: Normocephalic and atraumatic Mouth: No erythema or exudates, MMM Eyes: PERRL, EOMI, conjunctivae normal, No scleral icterus.  Neck: Supple, Trachea midline normal ROM, No JVD, mass, thyromegaly, or carotid bruit present.  Cardiovascular: RRR, S1 normal, S2 normal, no MRG, pulses symmetric and intact bilaterally Pulmonary/Chest: CTAB, no wheezes, rales, or rhonchi Abdominal: Soft. Non-tender, non-distended, bowel sounds  are normal, no masses, organomegaly, or guarding present.  GU: No CVA tenderness Musculoskeletal: No joint deformities, erythema, or stiffness, ROM full and no nontender Neurological: A&O x3, Strength is normal and symmetric bilaterally, cranial nerve II-XII are grossly intact, no focal motor deficit, sensory intact to light touch bilaterally.  Skin: 1-1 1/2 cm diameter sore, with mild erythema surrounding, non fluctuant, unable to expel any purulent material. Otherwise, skin is warm, dry and intact. No rash, cyanosis, or clubbing.  Psychiatric: Normal mood and affect. speech and behavior is normal. Judgment and thought content normal. Cognition and memory are normal.   Assessment & Plan:   Please refer to my Problem List based Assessment and Plan

## 2011-10-25 NOTE — Assessment & Plan Note (Signed)
She denies any issues related to her CTS, and denies pain or paresthesias. She did not pick up the wrist splints after her LOV and states that she does not need them at this time.

## 2011-10-25 NOTE — Assessment & Plan Note (Signed)
The aching headache in the back of her head seems more tension related, esp. given the increased work related stress in her life. She seemed to agree with this diagnosis. The aching pain she is experiencing in her face and temples is likely sinus/allergy related, given her 1 month history of increased nasal drainage and mild productive cough- likely due to PND.  I recommended trying and anti-histamine such as Claritin or Allegra. Debra Rodgers stated that she preferred to use Benadryl. I told her that it would be fine but that she should refrain from taking it during the day 2/2 drowsiness- she stated the she takes it at night only. I also told her she could try OTC Mucinex to help with drainage an congestion.

## 2011-10-25 NOTE — Assessment & Plan Note (Signed)
She endorses compliance with her medications. Her last lipid panel from 2/13 was normal.

## 2011-10-25 NOTE — Assessment & Plan Note (Signed)
Her HTN appears well controlled on the Dyazide, except when she had her recent episode of feeling very hot with diaphoresis. Unsure of the etiology for these episodes. She states that this is different from previous hot flashes and her last flash was 1 month ago, possibly. Pheochromocytoma is less likely, her blood pressure has been well controlled on the Dyazide, and while she does endorse diaphoresis, none was seen while she was experiencing an episode in the clinic. She is having headaches, but these appear to be allergy and stress related.   Recommended that Debra Rodgers keep a blood pressure log, recording her BP twice a week and during her episodes, and that she bring it to clinic at her follow-up appt w/ Dr. Obie Dredge on 11/15/11. Also told her to continue taking the Dyazide as prescribed.

## 2011-10-25 NOTE — Assessment & Plan Note (Signed)
She endorses compliance with her Prozac but  Stopped taking the Amitriptyline due to excessive drowsiness. She states that her mood has been good with some bad days, but if she has a bad day she is able to redirect her focus onto something positive. She appears to have good coping skills at this time. Her sleep seems improved, per pt, but she is only getting 6hrs/night maximum.

## 2011-10-25 NOTE — Progress Notes (Signed)
INTERNAL MEDICINE TEACHING ATTENDING ADDENDUM - Sid Falcon, MD: I personally saw and evaluated Ms. Durocher in this clinic visit in conjunction with the resident, Dr. Eulas Post. I have discussed patient's plan of care with medical resident during this visit. I have confirmed the physical exam findings and have read and agree with the clinic note including the plan with the following addition:  Elevated BP: Have requested Ms. Ruder to keep a BP log over the next few weeks prior to her IM appointment with Dr. Obie Dredge.  It is not clear at this time whether this was an isolated high blood pressure (Patient reported BP of 584K systolic while feeling flushed 2 days prior to appointment), or evidence of worsening of her hypertension.  Will not make medication changes at this time b/c of normal blood pressure here in clinic.   Headaches: Consistent with sinus headache vs. Tension headache.  Continue management with OTC allergy medication and occasional NSAID use. Dr. Eulas Post counseled patient on NSAID use and to avoid overuse.   Bug Bite: Healing.  Continue care with washing with soap/water and coverage with bandage.  No fluctuance or need for I&D noted at this time.  Advised patient that if it should worsen, to call the clinic.

## 2011-10-25 NOTE — Assessment & Plan Note (Signed)
This sore area, bug bite vs MSRA infection, looks to be improving. Mild erythema around the site, no visible drainage, unable to expel fluid. She is using Neosporin, and I told her to continue using the Neosporin and keep the site covered so she doe not continue to scratch it and make it worse. I asked her to call the clinic if the site has worsened or if the skin around it becomes more red or firm.

## 2011-10-25 NOTE — Assessment & Plan Note (Signed)
The aching pain she is experiencing in her face and temples is likely sinus/allergy related, given her 1 month history of increased nasal drainage and mild productive cough- likely due to PND.  I recommended trying an anti-histamine- she prefers using Benadryl. I told she should refrain from taking it during the day 2/2 drowsiness- she states that she only takes it at night. I also recommended trying OTC Mucinex to help with drainage and congestion.

## 2011-11-08 ENCOUNTER — Other Ambulatory Visit: Payer: Self-pay | Admitting: Internal Medicine

## 2011-11-11 MED ORDER — AMITRIPTYLINE HCL 25 MG PO TABS: 25.0000 mg | ORAL_TABLET | Freq: Every day | ORAL | Status: DC

## 2011-11-15 ENCOUNTER — Ambulatory Visit (INDEPENDENT_AMBULATORY_CARE_PROVIDER_SITE_OTHER): Payer: 59 | Admitting: Internal Medicine

## 2011-11-15 ENCOUNTER — Encounter: Payer: Self-pay | Admitting: Internal Medicine

## 2011-11-15 VITALS — BP 137/90 | HR 66 | Temp 98.2°F | Wt 154.5 lb

## 2011-11-15 DIAGNOSIS — E785 Hyperlipidemia, unspecified: Secondary | ICD-10-CM

## 2011-11-15 DIAGNOSIS — R400 Somnolence: Secondary | ICD-10-CM

## 2011-11-15 DIAGNOSIS — G4733 Obstructive sleep apnea (adult) (pediatric): Secondary | ICD-10-CM | POA: Insufficient documentation

## 2011-11-15 DIAGNOSIS — R51 Headache: Secondary | ICD-10-CM

## 2011-11-15 DIAGNOSIS — I10 Essential (primary) hypertension: Secondary | ICD-10-CM

## 2011-11-15 DIAGNOSIS — G44209 Tension-type headache, unspecified, not intractable: Secondary | ICD-10-CM

## 2011-11-15 LAB — LIPID PANEL
Cholesterol: 179 mg/dL (ref 0–200)
HDL: 58 mg/dL (ref >39)
LDL Cholesterol: 105 mg/dL — ABNORMAL HIGH (ref 0–99)
Total CHOL/HDL Ratio: 3.1 Ratio
Triglycerides: 78 mg/dL (ref <150)
VLDL: 16 mg/dL (ref 0–40)

## 2011-11-15 NOTE — Progress Notes (Signed)
Subjective:   Patient ID: Debra Rodgers female   DOB: 21-Sep-1945 66 y.o.   MRN: [id number]  HPI: [name] is a 66 y.o. woman who is here for follow up on HTN.  She states that it has been running high in the morning and she is having headaches with some ringing in her ear.  She states that the headaches are squeezing in nature over the temples and in the back of the head.  They start right away when she wakes up and sometimes wakes her from sleep.  She denies neck stiffness, fevers, chills, nausea, vomiting, or aura.  She has been using tylenol or ibuprofen for her pain.    She states that she has been having problems falling asleep at night, sometimes taking more then an hour to fall asleep and is up at 6.  She occasionally awakens during the night but then does go back to sleep until 6 am.  She has had incidents of waking herself up snoring but states that no one has mentioned that she sounds like she stops breathing.  She is tired throughout the day, sometimes falling asleep while even talking to people.  She denies weight gain, weight loss, dry skin, DOE, lower extremity swelling, or shortness of breath.   Past Medical History  Diagnosis Date  . Depression   . History of migraine headaches    Current Outpatient Prescriptions  Medication Sig Dispense Refill  . aspirin 81 MG EC tablet Take 81 mg by mouth daily.        Marland Kitchen atorvastatin (LIPITOR) 20 MG tablet Take 1 tablet (20 mg total) by mouth daily.  30 tablet  11  . diphenhydrAMINE (BENADRYL) 25 mg capsule Take 1 capsule (25 mg total) by mouth at bedtime as needed for itching or allergies.  24 capsule  2  . famotidine (PEPCID) 20 MG tablet Take 20 mg by mouth at bedtime as needed.      Marland Kitchen FLUoxetine (PROZAC) 20 MG capsule TAKE 1 CAPSULE BY MOUTH ONCE DAILY  30 capsule  3  . naproxen sodium (ANAPROX) 220 MG tablet Take 220 mg by mouth as needed.      . triamterene-hydrochlorothiazide (DYAZIDE) 37.5-25 MG per capsule Take 1 each (1  capsule total) by mouth daily.  90 capsule  3  . amitriptyline (ELAVIL) 25 MG tablet Take 1 tablet (25 mg total) by mouth at bedtime.  30 tablet  6   Family History  Problem Relation Age of Onset  . Hypertension Mother   . Stroke Mother   . Coronary artery disease Mother   . Heart disease Father   . Diabetes Sister   . Hypertension Sister   . Cancer Sister    History   Social History  . Marital Status: Married    Spouse Name: N/A    Number of Children: N/A  . Years of Education: N/A   Social History Main Topics  . Smoking status: Current Everyday Smoker -- 0.5 packs/day for 35 years    Types: Cigarettes  . Smokeless tobacco: None  . Alcohol Use: No  . Drug Use: No  . Sexually Active: None   Other Topics Concern  . None   Social History Narrative   Married, Regular Exercise- yes   Review of Systems: Negative except as noted in the HPI.   Objective:  Physical Exam: Filed Vitals:   11/15/11 1416 11/15/11 1442  BP: 141/99 137/90  Pulse: 69 66  Temp: 98.2 F (36.8 C)  TempSrc: Oral   Weight: 154 lb 8 oz (70.081 kg)   SpO2: 98%    Constitutional: Vital signs reviewed.  Patient is a well-developed and well-nourished woman in no acute distress and cooperative with exam. Alert and oriented x3.  Head: Normocephalic and atraumatic Ear: TM normal bilaterally Mouth: no erythema or exudates, MMM, mild redundant tissue in the posterior pharynx.  Eyes: PERRL, EOMI, conjunctivae normal, No scleral icterus.  Neck: Supple, Trachea midline, ROM is limited in all planes with mild pain with stretching, No JVD, mass, thyromegaly, or carotid bruit present.  Cardiovascular: RRR, S1 normal, S2 normal, no MRG, pulses symmetric and intact bilaterally Pulmonary/Chest: CTAB, no wheezes, rales, or rhonchi Abdominal: Soft. Non-tender, non-distended, bowel sounds are normal, no masses, organomegaly, or guarding present.  GU: no CVA tenderness Musculoskeletal: No joint deformities,  erythema, or stiffness, ROM full and no nontender Hematology: no cervical, inginal, or axillary adenopathy.  Neurological: A&O x3, Strength is normal and symmetric bilaterally, cranial nerve II-XII are grossly intact, no focal motor deficit, sensory intact to light touch bilaterally.  Skin: Warm, dry and intact. No rash, cyanosis, or clubbing.  Psychiatric: Normal mood and flat affect. speech and behavior is normal. Judgment and thought content normal. Cognition and memory are normal.   Assessment & Plan:

## 2011-11-15 NOTE — Patient Instructions (Signed)
1.  We will work to schedule the sleep study for you.  We will follow up after the test.  2.  Stop in the lab to have your blood drawn and I will call you if we need to follow up on anything.  3.  You have tension headaches.    - You can continue to use the Aleve or tylenol to help the pain.  - Remember the stretching exercises we did today to help you with the range of motion of your neck.  - Fill a tube sock half fill with rice, tie off the end, and microwave it for 5 minutes.  Be careful because it will be hot but you can use that to help relieve the neck pain.  4.  Follow up after the sleep study.   Tension Headache A tension headache is pain felt in the top of the head and back of the neck. Stress, feeling worried (anxiety), and feeling sad for a long time (depression) can cause these headaches. HOME CARE  Only take medicine as told by your doctor.   Relax by getting a massage or using your thoughts to control your body (biofeedback).   You may put ice or heat packs on the head and neck area.   Put ice in a plastic bag.   Place a towel between your skin and the bag.   Leave the ice on for 15 to 20 minutes, 3 to 4 times a day.   Try going to physical therapy.   You may need to make lifestyle changes if your headaches continue.   Avoid using too many pain killers. This can cause more headaches.  Finding out the results of your test Ask when your test results will be ready. Make sure you get your test results. GET HELP RIGHT AWAY IF:   You have problems with your medicines or they do not help.   Your headache changes or gets very bad.   You feel sick to your stomach (nauseous) or throw up (vomit).   You have a temperature by mouth above 102 F (38.9 C), not controlled by medicine.   You have a stiff neck, muscle weakness, or loss of muscle control.   You start having new problems.   You lose your balance, vision, or have trouble walking.   You feel faint or pass  out.  MAKE SURE YOU:   Understand these instructions.   Will watch your condition.   Will get help right away if you are not doing well or get worse.  Document Released: 06/05/2009 Document Revised: 02/28/2011 Document Reviewed: 06/05/2009 [facility] Patient Information 2012 [place].

## 2011-12-08 ENCOUNTER — Ambulatory Visit (HOSPITAL_BASED_OUTPATIENT_CLINIC_OR_DEPARTMENT_OTHER): Payer: 59 | Attending: Internal Medicine

## 2011-12-08 VITALS — Ht 64.0 in | Wt 154.0 lb

## 2011-12-08 DIAGNOSIS — R400 Somnolence: Secondary | ICD-10-CM

## 2011-12-08 DIAGNOSIS — G4733 Obstructive sleep apnea (adult) (pediatric): Secondary | ICD-10-CM

## 2011-12-13 ENCOUNTER — Ambulatory Visit (INDEPENDENT_AMBULATORY_CARE_PROVIDER_SITE_OTHER): Payer: 59 | Admitting: Internal Medicine

## 2011-12-13 ENCOUNTER — Encounter: Payer: Self-pay | Admitting: Internal Medicine

## 2011-12-13 VITALS — BP 130/83 | HR 63 | Temp 98.1°F | Ht 64.0 in | Wt 156.4 lb

## 2011-12-13 DIAGNOSIS — I10 Essential (primary) hypertension: Secondary | ICD-10-CM

## 2011-12-13 DIAGNOSIS — G4733 Obstructive sleep apnea (adult) (pediatric): Secondary | ICD-10-CM

## 2011-12-13 DIAGNOSIS — G44209 Tension-type headache, unspecified, not intractable: Secondary | ICD-10-CM

## 2011-12-13 DIAGNOSIS — R400 Somnolence: Secondary | ICD-10-CM

## 2011-12-13 DIAGNOSIS — J309 Allergic rhinitis, unspecified: Secondary | ICD-10-CM

## 2011-12-13 MED ORDER — FLUTICASONE PROPIONATE 50 MCG/ACT NA SUSP: 2.0000 | Freq: Every day | NASAL | Status: DC

## 2011-12-13 MED ORDER — CETIRIZINE HCL 10 MG PO TABS: 10.0000 mg | ORAL_TABLET | Freq: Every day | ORAL | Status: DC

## 2011-12-13 MED ORDER — AMITRIPTYLINE HCL 10 MG PO TABS: 10.0000 mg | ORAL_TABLET | Freq: Every day | ORAL | Status: DC

## 2011-12-13 NOTE — Patient Instructions (Signed)
1.  Pick up the Zyrtec (Cetirizine) 10 mg tablets.  Take 1 daily for your eyes and nose.  2.  Start Flonase nasal spray.  Use 1 spray per nostril once daily.  Make sure you aim toward the outside of the nostril.  If you get a headache from using it you need to aim outside more.  3.  I will wait until the official read comes for the sleep study and will call you when I get it.  4.  Follow up with me in about

## 2011-12-13 NOTE — Progress Notes (Signed)
Subjective:   Patient ID: Debra Rodgers female   DOB: [birth year] 66 y.o.   MRN: [id number]  HPI: [name] is a 66 y.o. woman who presents for follow up from her last appointment.  She underwent a sleep study on 12/08/11.  The results are pending today.  She states that they were not able to finish titrating the mask for her.    She states that her headaches are better but she continues to have headaches several days per week.  The pain is in the front of the head and wrap around to the back of her head.  She states she continues to have them first thing in the morning.  She denies neck pain, fevers, chills, nausea, vomiting, or changes in the headache.    She states that she has had problems with congestions in her nose.  She has had drainage down the back of her throat but nothing from the nose.  She has also had itchy, watery eyes along with the congestion and drainage. She has tried benadryl but that makes her sleepy during the day.  She denies cough or shortness of breath.   Past Medical History  Diagnosis Date  . Depression   . History of migraine headaches    Current Outpatient Prescriptions  Medication Sig Dispense Refill  . aspirin 81 MG EC tablet Take 81 mg by mouth daily.        Marland Kitchen atorvastatin (LIPITOR) 20 MG tablet Take 1 tablet (20 mg total) by mouth daily.  30 tablet  11  . diphenhydrAMINE (BENADRYL) 25 mg capsule Take 1 capsule (25 mg total) by mouth at bedtime as needed for itching or allergies.  24 capsule  2  . famotidine (PEPCID) 20 MG tablet Take 20 mg by mouth at bedtime as needed.      Marland Kitchen FLUoxetine (PROZAC) 20 MG capsule TAKE 1 CAPSULE BY MOUTH ONCE DAILY  30 capsule  3  . naproxen sodium (ANAPROX) 220 MG tablet Take 220 mg by mouth as needed.      . triamterene-hydrochlorothiazide (DYAZIDE) 37.5-25 MG per capsule Take 1 each (1 capsule total) by mouth daily.  90 capsule  3   Family History  Problem Relation Age of Onset  . Hypertension Mother   . Stroke  Mother   . Coronary artery disease Mother   . Heart disease Father   . Diabetes Sister   . Hypertension Sister   . Cancer Sister    History   Social History  . Marital Status: Married    Spouse Name: N/A    Number of Children: N/A  . Years of Education: N/A   Social History Main Topics  . Smoking status: Current Every Day Smoker -- 0.5 packs/day for 35 years    Types: Cigarettes  . Smokeless tobacco: None  . Alcohol Use: No  . Drug Use: No  . Sexually Active: None   Other Topics Concern  . None   Social History Narrative   Married, Regular Exercise- yes   Review of Systems: Negative except as noted in the HPI.   Objective:  Physical Exam: Filed Vitals:   12/13/11 1326  BP: 160/86  Pulse: 75  Temp: 98.1 F (36.7 C)  TempSrc: Oral  Height: 5' 4"  (1.626 m)  Weight: 156 lb 6.4 oz (70.943 kg)  SpO2: 98%   Constitutional: Vital signs reviewed.  Patient is a well-developed and well-nourished woman in no acute distress and cooperative with exam. Alert and oriented x3.  Head: Normocephalic and atraumatic Ear: TM normal bilaterally Mouth: no erythema or exudates, MMM Nose: Turbinate erythema and swelling noted.  No purulent drainage.  Eyes: PERRL, EOMI, conjunctivae normal, No scleral icterus.  Neck: Supple, Trachea midline normal ROM, No JVD, mass, thyromegaly, or carotid bruit present.  Cardiovascular: RRR, S1 normal, S2 normal, no MRG, pulses symmetric and intact bilaterally Pulmonary/Chest: CTAB, no wheezes, rales, or rhonchi Abdominal: Soft. Non-tender, non-distended, bowel sounds are normal, no masses, organomegaly, or guarding present.  GU: no CVA tenderness Musculoskeletal: No joint deformities, erythema, or stiffness, ROM full and no nontender Hematology: no cervical, inginal, or axillary adenopathy.  Neurological: A&O x3, Strength is normal and symmetric bilaterally, cranial nerve II-XII are grossly intact, no focal motor deficit, sensory intact to light  touch bilaterally.  Skin: Warm, dry and intact. No rash, cyanosis, or clubbing.  Psychiatric: Normal mood and affect. speech and behavior is normal. Judgment and thought content normal. Cognition and memory are normal.   Assessment & Plan:

## 2011-12-14 DIAGNOSIS — G4733 Obstructive sleep apnea (adult) (pediatric): Secondary | ICD-10-CM

## 2011-12-15 NOTE — Procedures (Signed)
NAME:  Debra Rodgers, Debra Rodgers               ACCOUNT NO.:  [id number]  MEDICAL RECORD NO.:  [id number]          PATIENT TYPE:  OUT  LOCATION:  SLEEP CENTER                 FACILITY:  [facility]  PHYSICIAN:  Clinton D. Annamaria Boots, MD, FCCP, FACPDATE OF BIRTH:  02-27-[birth year]  DATE OF STUDY:  12/08/2011                           NOCTURNAL POLYSOMNOGRAM  REFERRING PHYSICIAN:  Trish Fountain, MD  INDICATION FOR STUDY:  Hypersomnia with sleep apnea.  EPWORTH SLEEPINESS SCORE:  19/24.  BMI 26.4, weight 154 pounds, height 64 inches, neck 13 inches.  MEDICATIONS:  Home medications are charted and reviewed.  SLEEP ARCHITECTURE:  Total sleep time 317.5 minutes with sleep efficiency 70.4%.  Stage I was 4.7%, stage II 70.7%.  Stage III absent, REM 24.6% of total sleep time.  Sleep latency 56 minutes, REM latency 132 minutes.  Awake after sleep onset 77.5 minutes.  Arousal index 8.5.  BEDTIME MEDICATION:  None.  RESPIRATORY DATA:  Apnea-hypopnea index (AHI) 14.6 per hour.  A total of 77 events was scored including 29 obstructive apneas, 2 central apneas, 46 hypopneas.  The events were associated with supine sleep position and REM.  REM/AHI 31.5 per hour.  There were insufficient numbers of early events to meet protocol requirements for initiation of split protocol, CPAP titration on this study night.  OXYGEN DATA:  Very loud snoring with oxygen desaturation to a nadir of 84% and mean oxygen saturation through the study of 95.1% on room air.  CARDIAC DATA:  Normal sinus rhythm.  MOVEMENT-PARASOMNIA:  No significant movement disturbance.  Bathroom x1.  IMPRESSIONS-RECOMMENDATIONS: 1. Mild obstructive sleep apnea/hypopnea syndrome, AHI 14.6 per hour     with supine events, REM/AHI 31.5 per hour.  Very loud snoring with     oxygen desaturation to a nadir of 84% and mean oxygen saturation     through the study of 95.1% on room air. 2. There were insufficient early events to meet protocol requirements     for  split protocol CPAP titration.  Consider return for dedicated     CPAP titration study or evaluate for alternative management as     clinically appropriate.     Clinton D. Annamaria Boots, MD, [facility], [place], [place] Board of Sleep Medicine    CDY/MEDQ  D:  12/14/2011 16:02:20  T:  12/15/2011 49:32:41  Job:  [id number]

## 2011-12-29 NOTE — Assessment & Plan Note (Signed)
Lab Results  Component Value Date   NA 140 05/03/2011   K 3.8 05/03/2011   CL 102 05/03/2011   CO2 27 05/03/2011   BUN 23 05/03/2011   CREATININE 0.85 05/03/2011   CREATININE 0.75 01/17/2010    BP Readings from Last 3 Encounters:  11/15/11 137/90  10/24/11 121/75    Assessment: Hypertension control:  controlled  Progress toward goals:  at goal Barriers to meeting goals:  no barriers identified  Plan: Hypertension treatment:  continue current medications

## 2011-12-29 NOTE — Assessment & Plan Note (Signed)
Her headaches have the character of tension headaches with some neck stiffness.  She also may have a component that is associated with hypoxia at night because of apnea.  We will treat conservatively with stretching, ibuprofen, ice, and heat.  If this does not help we can try a muscle relaxant and PT as the next step.

## 2011-12-29 NOTE — Assessment & Plan Note (Signed)
Lab Results  Component Value Date   CHOL 179 11/15/2011   CHOL 178 05/03/2011   CHOL 276* 10/19/2010   Lab Results  Component Value Date   HDL 58 11/15/2011   HDL 66 05/03/2011   HDL 59 10/19/2010   Lab Results  Component Value Date   LDLCALC 105* 11/15/2011   LDLCALC 93 05/03/2011   LDLCALC 189* 10/19/2010   Lab Results  Component Value Date   TRIG 78 11/15/2011   TRIG 94 05/03/2011   TRIG 138 10/19/2010   Lab Results  Component Value Date   CHOLHDL 3.1 11/15/2011   CHOLHDL 2.7 05/03/2011   CHOLHDL 4.7 10/19/2010   No results found for this basename: LDLDIRECT   Her LDL is below her goal of less then 130.  We will monitor on a yearly basis.

## 2011-12-29 NOTE — Assessment & Plan Note (Signed)
She has daytime somnolence with snoring and morning headaches.  We will do a sleep study to see if she is having apnea that may be the cause of her symptoms.

## 2011-12-31 ENCOUNTER — Ambulatory Visit (HOSPITAL_BASED_OUTPATIENT_CLINIC_OR_DEPARTMENT_OTHER): Payer: 59 | Attending: Internal Medicine | Admitting: Radiology

## 2011-12-31 VITALS — Ht 64.0 in | Wt 156.0 lb

## 2011-12-31 DIAGNOSIS — G4710 Hypersomnia, unspecified: Secondary | ICD-10-CM | POA: Insufficient documentation

## 2011-12-31 DIAGNOSIS — G4733 Obstructive sleep apnea (adult) (pediatric): Secondary | ICD-10-CM

## 2011-12-31 DIAGNOSIS — G4730 Sleep apnea, unspecified: Secondary | ICD-10-CM | POA: Insufficient documentation

## 2011-12-31 DIAGNOSIS — R400 Somnolence: Secondary | ICD-10-CM

## 2012-01-04 DIAGNOSIS — G4710 Hypersomnia, unspecified: Secondary | ICD-10-CM

## 2012-01-04 DIAGNOSIS — G4730 Sleep apnea, unspecified: Secondary | ICD-10-CM

## 2012-01-04 NOTE — Procedures (Signed)
NAME:  Debra Rodgers, Debra Rodgers               ACCOUNT NO.:  [id number]  MEDICAL RECORD NO.:  [id number]          PATIENT TYPE:  OUT  LOCATION:  SLEEP CENTER                 FACILITY:  [facility]  PHYSICIAN:  Kaito Schulenburg D. Annamaria Boots, MD, FCCP, FACPDATE OF BIRTH:  [birth year] 05, 1945  DATE OF STUDY:  12/31/2011                           NOCTURNAL POLYSOMNOGRAM  REFERRING PHYSICIAN:  Trish Fountain, MD  REFERRING PHYSICIAN:  Trish Fountain, MD  INDICATION FOR STUDY:  Hypersomnia with sleep apnea.  EPWORTH SLEEPINESS SCORE:  19/24.  BMI 26.4, weight 154 pounds, height 64 inches, neck 13 inches.  MEDICATIONS:  Home medications are charted and reviewed.  A baseline diagnostic NPSG on December 08, 2011 had recorded an AHI of 14.6 per hour.  Body weight for that study was 154 pounds.  CPAP titration is now requested.  SLEEP ARCHITECTURE:  Total sleep time 363 minutes with sleep efficiency 86.9%.  Stage I was 6.1%, stage II 72.6%, stage III absent, REM 21.3% of total sleep time.  Sleep latency 31.5 minutes, REM latency 183.5 minutes, awake after sleep onset 24 minutes.  Arousal index 9.6. Bedtime medication:  None.  RESPIRATORY DATA:  CPAP titration protocol.  CPAP was titrated to 9 CWP, AHI 0.8 per hour.  She wore a small ResMed Quattro FX full-face mask with heated humidifier.  OXYGEN DATA:  Snoring was prevented by CPAP and mean oxygen saturation held 96.7% on room air.  CARDIAC DATA:  Sinus rhythm.  MOVEMENT/PARASOMNIA:  No significant movement disturbance.  No bathroom trips.  IMPRESSION/RECOMMENDATION: 1. Successful CPAP titration to 9 CWP, AHI 0.8 per hour.  She wore a     small ResMed Quattro FX full-face mask with heated humidifier.     Snoring was prevented and mean oxygen saturation held 96.7% on room     air. 2. Baseline diagnostic NPSG on December 08, 2011 had recorded AHI     14.6 per hour.  Body weight for that study was 154 pounds.     Marvelle Caudill D. Annamaria Boots, MD, [facility],  [place], [place] Board of Sleep Medicine    CDY/MEDQ  D:  01/04/2012 10:45:18  T:  01/04/2012 12:15:06  Job:  [id number]

## 2012-01-10 NOTE — Assessment & Plan Note (Signed)
She likely has some allergic rhinitis.  We will start Zyrtec as well as Flonase to see if that helps with her symptoms.  It would also help with the CPAP by reducing the inflammation.

## 2012-01-10 NOTE — Assessment & Plan Note (Signed)
Her headaches are likely tension headaches.  She also may have some component of nocturnal hypoxia with her OSA as well that is contributing.  We will continue conservative measures since she has only been doing these for a few weeks and continue to monitor.

## 2012-01-10 NOTE — Assessment & Plan Note (Signed)
She will need to go for a CPAP titration study after the reading of the sleep study showed mild OSA.  Once she completes that we will work to get her set up with a CPAP machine and see her back in about 6 weeks to see how she is tolerating this and if it has helped with her headaches and daytime sleepiness.

## 2012-01-10 NOTE — Assessment & Plan Note (Signed)
Lab Results  Component Value Date   NA 140 05/03/2011   K 3.8 05/03/2011   CL 102 05/03/2011   CO2 27 05/03/2011   BUN 23 05/03/2011   CREATININE 0.85 05/03/2011   CREATININE 0.75 01/17/2010    BP Readings from Last 3 Encounters:  12/13/11 130/83  11/15/11 137/90  10/24/11 121/75    Assessment: Hypertension control:  controlled  Progress toward goals:  at goal Barriers to meeting goals:  no barriers identified  Plan: Hypertension treatment:  continue current medications

## 2012-01-14 ENCOUNTER — Other Ambulatory Visit: Payer: Self-pay | Admitting: Internal Medicine

## 2012-01-14 DIAGNOSIS — G4733 Obstructive sleep apnea (adult) (pediatric): Secondary | ICD-10-CM

## 2012-01-23 ENCOUNTER — Telehealth: Payer: Self-pay | Admitting: *Deleted

## 2012-01-23 NOTE — Telephone Encounter (Signed)
Pt called and would like the results of her sleep study. Pt # [id number]

## 2012-01-23 NOTE — Telephone Encounter (Signed)
I will be on Vacation starting 11/1 so if you could please call her and let her know that they were able to successfully titrate her CPAP and that Debra Rodgers is working on getting her set up with the device.  Once she gets the device we should see her in the clinic about 4-6 weeks after using it to see how she is doing with it.

## 2012-01-24 NOTE — Telephone Encounter (Signed)
Pt called and message left to call clinic.

## 2012-01-24 NOTE — Telephone Encounter (Signed)
Pt has been informed and will make appointment.

## 2012-01-28 ENCOUNTER — Telehealth: Payer: Self-pay | Admitting: *Deleted

## 2012-01-28 NOTE — Telephone Encounter (Signed)
Message left on home phone recording - order for CPAP sent to [facility] and they will notify pt. Hilda Blades Sherill Wegener RN 01/28/12 12N

## 2012-03-29 ENCOUNTER — Other Ambulatory Visit: Payer: Self-pay | Admitting: Internal Medicine

## 2012-04-14 ENCOUNTER — Telehealth: Payer: Self-pay | Admitting: *Deleted

## 2012-04-14 NOTE — Telephone Encounter (Signed)
Pt called with c/o cold since last week including fever.   She rested and is better but still has productive cough and headache. Gets lightheaded when she bends over but does not seem orthostatic. Intake good.  Will see tomorrow AM

## 2012-04-15 ENCOUNTER — Encounter: Payer: Self-pay | Admitting: Internal Medicine

## 2012-04-15 ENCOUNTER — Ambulatory Visit (INDEPENDENT_AMBULATORY_CARE_PROVIDER_SITE_OTHER): Payer: 59 | Admitting: Internal Medicine

## 2012-04-15 VITALS — BP 141/81 | HR 65 | Temp 98.1°F | Wt 158.6 lb

## 2012-04-15 DIAGNOSIS — I10 Essential (primary) hypertension: Secondary | ICD-10-CM

## 2012-04-15 DIAGNOSIS — B9789 Other viral agents as the cause of diseases classified elsewhere: Secondary | ICD-10-CM

## 2012-04-15 DIAGNOSIS — J988 Other specified respiratory disorders: Secondary | ICD-10-CM

## 2012-04-15 NOTE — Progress Notes (Signed)
Subjective:   Patient ID: Debra Rodgers female   DOB: [birth year] 67 y.o.   MRN: [id number]  HPI: Debra Rodgers is a 67 y.o. woman pmh OSA and smoking presents acutely for cough and myalgia for past week. Patient describes onset of symptoms exactly on Tuesday of last week with generalized myalgia, cough, nasal congestion, and subjective fever and night sweats that only lasted one day. Patient originally had decreased appetite but that has greatly improved over the past 3 days. Patient does not have history of asthma or COPD but continues to smoke daily despite recent illnesses as well. Patient does feel that symptoms have improved since onset and cough is now producing clear mucus. No hemoptysis, no dizziness, no tinnitus, no chest pain, no palpitation sensation, no nausea/vomiting. Patient has had one episode of diarrhea today. Patient's work does expose her to sick contacts.    Past Medical History  Diagnosis Date  . Depression   . History of migraine headaches    Current Outpatient Prescriptions  Medication Sig Dispense Refill  . amitriptyline (ELAVIL) 10 MG tablet Take 1 tablet (10 mg total) by mouth at bedtime.  30 tablet  6  . aspirin 81 MG EC tablet Take 81 mg by mouth daily.        Marland Kitchen atorvastatin (LIPITOR) 20 MG tablet Take 1 tablet (20 mg total) by mouth daily.  30 tablet  11  . cetirizine (ZYRTEC) 10 MG tablet Take 1 tablet (10 mg total) by mouth daily.  30 tablet  0  . famotidine (PEPCID) 20 MG tablet Take 20 mg by mouth at bedtime as needed.      Marland Kitchen FLUoxetine (PROZAC) 20 MG capsule Take 1 capsule (20 mg total) by mouth daily.  30 capsule  3  . fluticasone (FLONASE) 50 MCG/ACT nasal spray Place 2 sprays into the nose daily.  16 g  2  . naproxen sodium (ANAPROX) 220 MG tablet Take 220 mg by mouth as needed.      . triamterene-hydrochlorothiazide (DYAZIDE) 37.5-25 MG per capsule Take 1 each (1 capsule total) by mouth daily.  90 capsule  3   Family History  Problem Relation Age  of Onset  . Hypertension Mother   . Stroke Mother   . Coronary artery disease Mother   . Heart disease Father   . Diabetes Sister   . Hypertension Sister   . Cancer Sister    History   Social History  . Marital Status: Married    Spouse Name: N/A    Number of Children: N/A  . Years of Education: N/A   Social History Main Topics  . Smoking status: Current Every Day Smoker -- 0.5 packs/day for 35 years    Types: Cigarettes  . Smokeless tobacco: Not on file  . Alcohol Use: No  . Drug Use: No  . Sexually Active: Not on file   Other Topics Concern  . Not on file   Social History Narrative   Married, Regular Exercise- yes   Review of Systems: otherwise negative unless listed in HPI  Objective:  Physical Exam: Filed Vitals:   04/15/12 0834 04/15/12 0901  BP: 154/92 141/81  Pulse: 71 65  Temp: 98.1 F (36.7 C)   TempSrc: Oral   Weight: 158 lb 9.6 oz (71.94 kg)   SpO2: 99%    General: NAD HEENT: PERRL, EOMI, no scleral icterus, TM blocked by cerumen in canal but non-erythematous, clear PND, yellow lining on tongue, no cervical, periauricular lymphadenopathy Cardiac: RRR, no  rubs, murmurs or gallops Pulm: some very slight expiratory wheezes, no crackles or rales, moving normal volumes of air Abd: soft, nontender, nondistended, BS present Ext: warm and well perfused, no pedal edema Neuro: alert and oriented X3, cranial nerves II-XII grossly intact  Assessment & Plan:  1. viral flulike illness: Patient is experiencing myalgia and clear PND along with productive cough of clear/yellow sputum. That has been going on for about the past week. Patient does feel better than when illness first started. -Supportive management patient refused nasal spray, cough suppressant, and albuterol inhaler -Work letter given to patient  2. HTN: pt has had some elevated readings in the past per chart review and discussion of lifestyle changes was made as pt was not interested in pursuing  medication at this time. May consider at another visit.  Patient discussed with Dr. Ellwood Dense

## 2012-04-15 NOTE — Patient Instructions (Signed)
April 15, 2012 To whom it may concern: This is to inform you that  Ms. Debra Rodgers was evaluated at Internal Medicine clinic for an ongoing illness and had to miss work 04/14/12-04/15/12. If you have any questions you can contact me  at the clinic: [phone].  Thank you,   Clinton Gallant, MD Internal Medicine

## 2012-04-27 ENCOUNTER — Other Ambulatory Visit: Payer: Self-pay | Admitting: Internal Medicine

## 2012-04-27 DIAGNOSIS — Z1231 Encounter for screening mammogram for malignant neoplasm of breast: Secondary | ICD-10-CM

## 2012-05-07 ENCOUNTER — Ambulatory Visit (HOSPITAL_COMMUNITY): Payer: 59

## 2012-05-15 ENCOUNTER — Ambulatory Visit (HOSPITAL_COMMUNITY)
Admission: RE | Admit: 2012-05-15 | Discharge: 2012-05-15 | Disposition: A | Discharge status: Home / Self Care | Payer: 59 | Source: Ambulatory Visit | Attending: Internal Medicine | Admitting: Internal Medicine

## 2012-05-15 DIAGNOSIS — Z1231 Encounter for screening mammogram for malignant neoplasm of breast: Secondary | ICD-10-CM | POA: Insufficient documentation

## 2012-06-05 ENCOUNTER — Encounter: Payer: Self-pay | Admitting: Internal Medicine

## 2012-06-05 ENCOUNTER — Ambulatory Visit (INDEPENDENT_AMBULATORY_CARE_PROVIDER_SITE_OTHER): Payer: 59 | Admitting: Internal Medicine

## 2012-06-05 VITALS — BP 139/92 | HR 77 | Temp 98.7°F | Wt 160.9 lb

## 2012-06-05 DIAGNOSIS — I10 Essential (primary) hypertension: Secondary | ICD-10-CM

## 2012-06-05 DIAGNOSIS — M5412 Radiculopathy, cervical region: Secondary | ICD-10-CM

## 2012-06-05 DIAGNOSIS — G4733 Obstructive sleep apnea (adult) (pediatric): Secondary | ICD-10-CM

## 2012-06-05 MED ORDER — NAPROXEN 375 MG PO TABS: 375.0000 mg | ORAL_TABLET | Freq: Two times a day (BID) | ORAL | Status: DC

## 2012-06-05 NOTE — Progress Notes (Signed)
Subjective:   Patient ID: Debra Rodgers female   DOB: 03/06/1946 67 y.o.   MRN: [id number]  HPI: Debra Rodgers is a 67 y.o. woman who presents to clinic today for follow up on her chronic medical condition including mild OSA on CPAP and hypertension.  See Problem focused Assessment and Plan for full details of her chronic medical conditions.    She states that her left shoulder has been hurting for the last month.  She has noted sometimes her hand goes numb when she props hands on a table or the arm of a chair.  She states that the pain and tingling goes down the neck and arm into the hand.  She also can make it happen by looking off to the right.  She hasn't been back to seeing Dr. Conard Novak for a long time.  She has a history of a fusion in 2008 at C3-C4 and C4-C5.  Her images at the time showed some changes on the left at C6, C7, and T1.  She denies weakness in the hands or arms, problems walking, or loss of bowel or bladder continence.    Past Medical History  Diagnosis Date  . Depression   . History of migraine headaches    Current Outpatient Prescriptions  Medication Sig Dispense Refill  . amitriptyline (ELAVIL) 10 MG tablet Take 1 tablet (10 mg total) by mouth at bedtime.  30 tablet  6  . aspirin 81 MG EC tablet Take 81 mg by mouth daily.        Marland Kitchen atorvastatin (LIPITOR) 20 MG tablet Take 1 tablet (20 mg total) by mouth daily.  30 tablet  11  . cetirizine (ZYRTEC) 10 MG tablet Take 1 tablet (10 mg total) by mouth daily.  30 tablet  0  . famotidine (PEPCID) 20 MG tablet Take 20 mg by mouth at bedtime as needed.      Marland Kitchen FLUoxetine (PROZAC) 20 MG capsule Take 1 capsule (20 mg total) by mouth daily.  30 capsule  3  . fluticasone (FLONASE) 50 MCG/ACT nasal spray Place 2 sprays into the nose daily.  16 g  2  . naproxen sodium (ANAPROX) 220 MG tablet Take 220 mg by mouth as needed.      . triamterene-hydrochlorothiazide (DYAZIDE) 37.5-25 MG per capsule Take 1 each (1 capsule total) by mouth  daily.  90 capsule  3   No current facility-administered medications for this visit.   Family History  Problem Relation Age of Onset  . Hypertension Mother   . Stroke Mother   . Coronary artery disease Mother   . Heart disease Father   . Diabetes Sister   . Hypertension Sister   . Cancer Sister    History   Social History  . Marital Status: Married    Spouse Name: N/A    Number of Children: N/A  . Years of Education: N/A   Social History Main Topics  . Smoking status: Current Every Day Smoker -- 0.50 packs/day for 35 years    Types: Cigarettes  . Smokeless tobacco: None  . Alcohol Use: No  . Drug Use: No  . Sexually Active: None   Other Topics Concern  . None   Social History Narrative   Married, Regular Exercise- yes   Review of Systems: A full 12 system ROS is negative except as noted in the HPI and A&P.   Objective:  Physical Exam: Filed Vitals:   06/05/12 1629  BP: 139/92  Pulse: 77  Temp:  98.7 F (37.1 C)  TempSrc: Oral  Weight: 160 lb 14.4 oz (72.984 kg)  SpO2: 97%   Constitutional: Vital signs reviewed.  Patient is a well-developed and well-nourished woman in no acute distress and cooperative with exam. Alert and oriented x3.  Head: Normocephalic and atraumatic Ear: TM normal bilaterally Mouth: no erythema or exudates, MMM Eyes: PERRL, EOMI, conjunctivae normal, No scleral icterus.  Neck: Supple, Trachea midline normal ROM, No JVD, mass, thyromegaly, or carotid bruit present.  Cardiovascular: RRR, S1 normal, S2 normal, no MRG, pulses symmetric and intact bilaterally Pulmonary/Chest: CTAB, no wheezes, rales, or rhonchi Abdominal: Soft. Non-tender, non-distended, bowel sounds are normal, no masses, organomegaly, or guarding present.  GU: no CVA tenderness Musculoskeletal: No joint deformities, erythema, or stiffness, ROM full and no nontender Hematology: no cervical, inginal, or axillary adenopathy.  Neurological: A&O x3, Strength is normal and  symmetric bilaterally, cranial nerve II-XII are grossly intact, Sperling's test positive on the left with recreation of the pain   Skin: Warm, dry and intact. No rash, cyanosis, or clubbing.  Psychiatric: Normal mood and affect. speech and behavior is normal. Judgment and thought content normal. Cognition and memory are normal.   Assessment & Plan:

## 2012-06-05 NOTE — Patient Instructions (Signed)
1.  Start Naprosyn 375 mg tablets.  Take 1 tablet twice daily for your neck.    2.  Use a heating pad or ice pack, 20 minutes at a time, several times daily to help with the swelling in the neck.   3.  We will work on getting you over to the physical therapist for therapy for the neck.  4.  Keep an eye out for the warning signs listed below.  If you notice any please call right away  - Numbness that doesn't go away  - weakness in the arm  - weakness in the legs  - loss of bowel or bladder control  - numbness around your pelvis  5.  Follow up with me in 4 weeks to see how you are doing.

## 2012-06-11 ENCOUNTER — Ambulatory Visit: Payer: 59 | Admitting: Internal Medicine

## 2012-06-21 ENCOUNTER — Other Ambulatory Visit: Payer: Self-pay | Admitting: Internal Medicine

## 2012-06-30 ENCOUNTER — Encounter: Payer: Self-pay | Admitting: *Deleted

## 2012-07-03 ENCOUNTER — Encounter: Payer: Self-pay | Admitting: Internal Medicine

## 2012-07-03 ENCOUNTER — Ambulatory Visit (INDEPENDENT_AMBULATORY_CARE_PROVIDER_SITE_OTHER): Payer: 59 | Admitting: Internal Medicine

## 2012-07-03 VITALS — BP 157/92 | HR 71 | Temp 98.2°F | Wt 159.7 lb

## 2012-07-03 DIAGNOSIS — E785 Hyperlipidemia, unspecified: Secondary | ICD-10-CM

## 2012-07-03 DIAGNOSIS — I10 Essential (primary) hypertension: Secondary | ICD-10-CM

## 2012-07-03 DIAGNOSIS — M5412 Radiculopathy, cervical region: Secondary | ICD-10-CM

## 2012-07-03 MED ORDER — LORAZEPAM 1 MG PO TABS: ORAL_TABLET | ORAL | Status: DC

## 2012-07-03 NOTE — Patient Instructions (Signed)
1.  Stop in the lab.  We will draw your blood and if we need to discuss any of the results I will call you.  2.  We will work to get you set up for the MRI for your neck.    - Use the ativan before the MRI.  Take 1 tablet about 20-30 minute before the MRI.  It is okay to repeat it after about 1 hour if you are still anxious  3.  Work on getting the PT set up for your shoulder and neck.  4.  Follow up with me on 5/23 to discuss the results and see how PT is doing.

## 2012-07-03 NOTE — Progress Notes (Signed)
Subjective:   Patient ID: Debra Rodgers female   DOB: Sep 18, 1945 67 y.o.   MRN: [id number]  HPI: Debra Rodgers is a 67 y.o. woman presents to clinic today for follow up on her chronic medical problems including hypertension, hyperlipidemia, and left cervical radiculopathy.  See Problem focused Assessment and Plan for full details of her chronic medical conditions.   Lipid, MRI, butero, and PT.    Past Medical History  Diagnosis Date  . Depression   . History of migraine headaches    Current Outpatient Prescriptions  Medication Sig Dispense Refill  . aspirin 81 MG EC tablet Take 81 mg by mouth daily.        Marland Kitchen atorvastatin (LIPITOR) 20 MG tablet Take 1 tablet (20 mg total) by mouth daily.  30 tablet  11  . cetirizine (ZYRTEC) 10 MG tablet Take 1 tablet (10 mg total) by mouth daily.  30 tablet  0  . famotidine (PEPCID) 20 MG tablet Take 20 mg by mouth at bedtime as needed.      Marland Kitchen FLUoxetine (PROZAC) 20 MG capsule Take 1 capsule (20 mg total) by mouth daily.  30 capsule  3  . naproxen (NAPROSYN) 375 MG tablet Take 1 tablet (375 mg total) by mouth 2 (two) times daily with a meal.  60 tablet  2  . naproxen sodium (ANAPROX) 220 MG tablet Take 220 mg by mouth as needed.      . triamterene-hydrochlorothiazide (DYAZIDE) 37.5-25 MG per capsule Take 1 each (1 capsule total) by mouth daily.  90 capsule  3   No current facility-administered medications for this visit.   Family History  Problem Relation Age of Onset  . Hypertension Mother   . Stroke Mother   . Coronary artery disease Mother   . Heart disease Father   . Diabetes Sister   . Hypertension Sister   . Cancer Sister    History   Social History  . Marital Status: Married    Spouse Name: N/A    Number of Children: N/A  . Years of Education: N/A   Social History Main Topics  . Smoking status: Current Every Day Smoker -- 0.50 packs/day for 35 years    Types: Cigarettes  . Smokeless tobacco: None  . Alcohol Use: No  . Drug  Use: No  . Sexually Active: None   Other Topics Concern  . None   Social History Narrative   Married, Regular Exercise- yes   Review of Systems: A full 12 system ROS is negative except as noted in the HPI and A&P.   Objective:  Physical Exam: Filed Vitals:   07/03/12 1558  BP: 157/92  Pulse: 71  Temp: 98.2 F (36.8 C)  TempSrc: Oral  Weight: 159 lb 11.2 oz (72.439 kg)  SpO2: 98%   Constitutional: Vital signs reviewed.  Patient is a well-developed and well-nourished woman in no acute distress and cooperative with exam. Alert and oriented x3.  Head: Normocephalic and atraumatic Ear: TM normal bilaterally Mouth: no erythema or exudates, MMM Eyes: PERRL, EOMI, conjunctivae normal, No scleral icterus.  Neck: Supple, Trachea midline normal ROM, No JVD, mass, thyromegaly, or carotid bruit present.  Cardiovascular: RRR, S1 normal, S2 normal, no MRG, pulses symmetric and intact bilaterally Pulmonary/Chest: CTAB, no wheezes, rales, or rhonchi Abdominal: Soft. Non-tender, non-distended, bowel sounds are normal, no masses, organomegaly, or guarding present.  GU: no CVA tenderness Musculoskeletal: No joint deformities, erythema, or stiffness, ROM full and no nontender Hematology: no cervical, inginal, or  axillary adenopathy.  Neurological: A&O x3, Strength is normal and symmetric bilaterally, cranial nerve II-XII are grossly intact, Sperling's test positive on the left.    Skin: Warm, dry and intact. No rash, cyanosis, or clubbing.  Psychiatric: Normal mood and affect. speech and behavior is normal. Judgment and thought content normal. Cognition and memory are normal.   Assessment & Plan:

## 2012-07-04 LAB — LIPID PANEL
Cholesterol: 179 mg/dL (ref 0–200)
HDL: 60 mg/dL (ref >39)
LDL Cholesterol: 97 mg/dL (ref 0–99)
Total CHOL/HDL Ratio: 3 Ratio
Triglycerides: 109 mg/dL (ref <150)
VLDL: 22 mg/dL (ref 0–40)

## 2012-07-04 LAB — COMPREHENSIVE METABOLIC PANEL
ALT: 16 U/L (ref 0–35)
AST: 18 U/L (ref 0–37)
Albumin: 4.2 g/dL (ref 3.5–5.2)
Alkaline Phosphatase: 69 U/L (ref 39–117)
BUN: 19 mg/dL (ref 6–23)
CO2: 28 mEq/L (ref 19–32)
Calcium: 9.4 mg/dL (ref 8.4–10.5)
Chloride: 103 mEq/L (ref 96–112)
Creat: 0.73 mg/dL (ref 0.50–1.10)
Glucose, Bld: 82 mg/dL (ref 70–99)
Potassium: 3.8 mEq/L (ref 3.5–5.3)
Sodium: 138 mEq/L (ref 135–145)
Total Bilirubin: 0.7 mg/dL (ref 0.3–1.2)
Total Protein: 6.8 g/dL (ref 6.0–8.3)

## 2012-07-13 ENCOUNTER — Encounter: Payer: Self-pay | Admitting: *Deleted

## 2012-07-13 ENCOUNTER — Telehealth: Payer: Self-pay | Admitting: *Deleted

## 2012-07-13 NOTE — Telephone Encounter (Signed)
Pt stopped by clinic - past  4 days problems with head congestion and yelloe productive cough. Have no appt in clinic till 07/16/12 . Appt made 07/16/12 9:15AM Dr Para Skeans. Suggest to try Urgent Care. Hilda Blades Leahanna Buser RN 07/13/12 11:45AM

## 2012-07-16 ENCOUNTER — Ambulatory Visit (INDEPENDENT_AMBULATORY_CARE_PROVIDER_SITE_OTHER): Payer: 59 | Admitting: Radiation Oncology

## 2012-07-16 ENCOUNTER — Encounter: Payer: Self-pay | Admitting: Radiation Oncology

## 2012-07-16 ENCOUNTER — Ambulatory Visit (HOSPITAL_COMMUNITY)
Admission: RE | Admit: 2012-07-16 | Discharge: 2012-07-16 | Disposition: A | Discharge status: Home / Self Care | Payer: 59 | Source: Ambulatory Visit | Attending: Internal Medicine | Admitting: Internal Medicine

## 2012-07-16 VITALS — BP 153/99 | HR 70 | Temp 98.2°F | Ht 64.0 in | Wt 163.2 lb

## 2012-07-16 DIAGNOSIS — R05 Cough: Secondary | ICD-10-CM | POA: Insufficient documentation

## 2012-07-16 DIAGNOSIS — J479 Bronchiectasis, uncomplicated: Secondary | ICD-10-CM

## 2012-07-16 DIAGNOSIS — F17200 Nicotine dependence, unspecified, uncomplicated: Secondary | ICD-10-CM

## 2012-07-16 DIAGNOSIS — M4120 Other idiopathic scoliosis, site unspecified: Secondary | ICD-10-CM | POA: Insufficient documentation

## 2012-07-16 DIAGNOSIS — R059 Cough, unspecified: Secondary | ICD-10-CM | POA: Insufficient documentation

## 2012-07-16 DIAGNOSIS — R0602 Shortness of breath: Secondary | ICD-10-CM

## 2012-07-16 MED ORDER — AZITHROMYCIN 250 MG PO TABS: ORAL_TABLET | ORAL | Status: AC

## 2012-07-16 MED ORDER — ALBUTEROL SULFATE HFA 108 (90 BASE) MCG/ACT IN AERS: 2.0000 | INHALATION_SPRAY | Freq: Four times a day (QID) | RESPIRATORY_TRACT | Status: DC | PRN

## 2012-07-16 NOTE — Assessment & Plan Note (Signed)
CXR was unrevealing of pneumonia, however it appears the patient does have bronchiectasis which has been present since previous CXR in 2011. Given her presentation with SOB and productive cough, will treat as a bronchiectasis flare. Patient does not have a significant history of similar episodes requiring frequent antibiotic treatment, therefore the likelihood of infection with an MDR organism as low. - Z-Pak - Patient was instructed to contact the clinic if symptoms are not improved with treatment, at which time antibiotic coverage should be broadened and sputum culture obtained

## 2012-07-16 NOTE — Assessment & Plan Note (Signed)
  Assessment: Progress toward smoking cessation:  smoking the same amount Barriers to progress toward smoking cessation:    Comments:   Plan: Instruction/counseling given:  I counseled patient on the dangers of tobacco use. Educational resources provided:    Self management tools provided:  smoking cessation plan (STAR Quit Plan) Medications to assist with smoking cessation:   Patient agreed to the following self-care plans for smoking cessation: go to the [employer] (https://[employer]info/)  Other plans: Patient was counseled on the importance of smoking cessation in helping prevent future similar episodes of bronchitis/bronchiectasis.

## 2012-07-16 NOTE — Patient Instructions (Addendum)
Please take your antibiotics as prescribed. Contact us if your symptoms do not improve, as we may need to try a different antibiotic. Have a great day.

## 2012-07-16 NOTE — Progress Notes (Signed)
  Subjective:    Patient ID: Debra Rodgers, female    DOB: Feb 01, 1946, 67 y.o.   MRN: [id number]  Cough Associated symptoms include chills, shortness of breath and wheezing. Pertinent negatives include no chest pain, fever or rhinorrhea.   Patient is a 67 year old woman who presented to clinic today with complaints of cough and shortness of breath the last 3 days. She states that these symptoms were preceded by a cold with nasal congestion and sore throat which have now resolved.  The patient states the cough has been productive of yellow sputum. Her shortness of breath started shortly after her cough, and has slowly progressed in severity over the last 3 days. She denies any recent sick contacts. She states she's had pneumonia once in the past for which she was treated with antibiotics.   Review of Systems  Constitutional: Positive for chills. Negative for fever.  HENT: Negative for congestion, rhinorrhea and sinus pressure.   Eyes: Negative.   Respiratory: Positive for cough, shortness of breath and wheezing.   Cardiovascular: Negative for chest pain and leg swelling.  Gastrointestinal: Negative for nausea, vomiting, diarrhea and abdominal distention.  Endocrine: Negative.   Genitourinary: Negative for dysuria and hematuria.  Musculoskeletal: Negative.   Skin: Negative.   Allergic/Immunologic: Negative.   Neurological: Negative.   Hematological: Negative.   Psychiatric/Behavioral: Negative.        Objective:   Physical Exam  Constitutional: She is oriented to person, place, and time. She appears well-developed and well-nourished. No distress.  HENT:  Head: Normocephalic and atraumatic.  Eyes: Conjunctivae are normal. Pupils are equal, round, and reactive to light.  Neck: Normal range of motion. Neck supple. No tracheal deviation present.  Cardiovascular: Normal rate and regular rhythm.   No murmur heard. Pulmonary/Chest: Effort normal. She has no wheezes. She has no rales.   Abdominal: Soft. Bowel sounds are normal. She exhibits no distension. There is no tenderness.  Musculoskeletal: Normal range of motion. She exhibits no edema.  Neurological: She is alert and oriented to person, place, and time. No cranial nerve deficit.  Skin: Skin is warm and dry. No erythema.  Psychiatric: She has a normal mood and affect. Her behavior is normal.          Assessment & Plan:

## 2012-07-16 NOTE — Progress Notes (Signed)
Case discussed with Dr. Para Skeans at the time of the visit. We reviewed the resident's history and exam and pertinent patient test results. I agree with the assessment, diagnosis and plan of care documented in the resident's note.  Interestingly, she has LUL focal tram tracking on CXR unchanged from 2011.  Therefore, the productive cough is likely secondary to a bronchiectasis flare.  The cause of the bronchiectasis is unclear.

## 2012-08-05 ENCOUNTER — Other Ambulatory Visit: Payer: Self-pay | Admitting: Internal Medicine

## 2012-09-19 NOTE — Assessment & Plan Note (Signed)
She states that she has been using her CPAP every night and other then some mild dryness in the nose it the morning she is tolerating it well.  She denies daytime somnolence, snoring, or headaches in the morning.  We will continue her CPAP therapy and monitor.

## 2012-09-19 NOTE — Assessment & Plan Note (Signed)
She has been taking her Dyazide and states that she is feeling well.  She denies headache, chest pain, or changes in her vision.    BP Readings from Last 3 Encounters:  06/05/12 139/92  04/15/12 141/81  12/13/11 130/83   Blood pressure is well controlled.  We will continue her medication for now.  Her last bmet was done just over a year ago and it would be worthwhile doing another at her next follow up

## 2012-09-19 NOTE — Assessment & Plan Note (Signed)
She continues on her dyazide.  She is due for a Bmet today.  Basic Metabolic Panel:    Component Value Date/Time   NA 138 07/03/2012 1634   K 3.8 07/03/2012 1634   CL 103 07/03/2012 1634   CO2 28 07/03/2012 1634   BUN 19 07/03/2012 1634   CREATININE 0.73 07/03/2012 1634   CREATININE 0.75 01/17/2010 2112   GLUCOSE 82 07/03/2012 1634   CALCIUM 9.4 07/03/2012 1634   BP Readings from Last 3 Encounters:  07/03/12 157/92  06/05/12 139/92  04/15/12 141/81   BP is mildly elevated today likely secondary to pain in her neck.  We will monitor and see how she does once we get her pain under better control.

## 2012-09-19 NOTE — Assessment & Plan Note (Signed)
She has not set up PT yet.  She is hesitant to do the MRI again.  We will give her ativan to take before she does the MRI.  We discussed going back to Dr. Conard Novak as he is the one who would be fixing her problem.

## 2012-09-19 NOTE — Assessment & Plan Note (Signed)
She is having symptoms consistent with cervical radiculopathy on the left.  The CT and MRI that she had in 2008 showed moderate changes in c6,C7, and T1 which would correlate with her symptoms.  We will have her go back to Dr. Conard Novak for evaluation.

## 2012-09-19 NOTE — Assessment & Plan Note (Signed)
She is taking lipitor 20 mg daily and denies leg pain or changes in her urine.    Lab Results  Component Value Date   CHOL 179 07/03/2012   CHOL 179 11/15/2011   CHOL 178 05/03/2011   Lab Results  Component Value Date   HDL 60 07/03/2012   HDL 58 11/15/2011   HDL 66 05/03/2011   Lab Results  Component Value Date   LDLCALC 97 07/03/2012   LDLCALC 105* 11/15/2011   LDLCALC 93 05/03/2011   Lab Results  Component Value Date   TRIG 109 07/03/2012   TRIG 78 11/15/2011   TRIG 94 05/03/2011   Lab Results  Component Value Date   CHOLHDL 3.0 07/03/2012   CHOLHDL 3.1 11/15/2011   CHOLHDL 2.7 05/03/2011   No results found for this basename: LDLDIRECT   LDL is under <100.  She is not a diabetic but her 10 year risk of heart disease is 12.6% which qualifies her for a moderate to high potency statin.

## 2012-10-31 ENCOUNTER — Other Ambulatory Visit: Payer: Self-pay | Admitting: Internal Medicine

## 2012-11-02 NOTE — Telephone Encounter (Signed)
Please have her sch appt with me (PCP) routine sometime in next few months. I have never seen her. Not urgent. GAve 6 months RF.

## 2012-11-03 ENCOUNTER — Ambulatory Visit (INDEPENDENT_AMBULATORY_CARE_PROVIDER_SITE_OTHER): Payer: 59 | Admitting: Internal Medicine

## 2012-11-03 ENCOUNTER — Encounter: Payer: Self-pay | Admitting: Internal Medicine

## 2012-11-03 VITALS — BP 150/94 | HR 74 | Temp 97.6°F | Ht 64.0 in | Wt 156.2 lb

## 2012-11-03 DIAGNOSIS — F17200 Nicotine dependence, unspecified, uncomplicated: Secondary | ICD-10-CM

## 2012-11-03 DIAGNOSIS — E785 Hyperlipidemia, unspecified: Secondary | ICD-10-CM

## 2012-11-03 DIAGNOSIS — I10 Essential (primary) hypertension: Secondary | ICD-10-CM

## 2012-11-03 DIAGNOSIS — G4733 Obstructive sleep apnea (adult) (pediatric): Secondary | ICD-10-CM

## 2012-11-03 DIAGNOSIS — G44209 Tension-type headache, unspecified, not intractable: Secondary | ICD-10-CM

## 2012-11-03 NOTE — Assessment & Plan Note (Addendum)
Assessment: Pt interested in quitting. Progress toward smoking cessation:  smoking the same amount Barriers to progress toward smoking cessation:  withdrawal symptoms  Plan:  Instruction/counseling given:  I counseled patient on the dangers of tobacco use, advised patient to stop smoking, and reviewed strategies to maximize success. Educational resources provided:  [employer] account [employer]) brochure Self management tools provided:  smoking cessation plan (STAR Quit Plan) Medications to assist with smoking cessation:  Nicotine Lozenge Patient agreed to the following self-care plans for smoking cessation: call [facility] (1-800-QUIT-NOW)

## 2012-11-03 NOTE — Progress Notes (Signed)
Patient ID: Debra Rodgers, female   DOB: [birth year], 67 y.o.   MRN: [id number]   Subjective:   Patient ID: Debra Rodgers female   DOB: 1945/09/11 67 y.o.   MRN: [id number]  HPI: [name] is a 67 y.o. woman with history of HTN, HL, tobacco abuse, tension and sinus headaches who presents today for headache.    Pt states that she has had daily headaches for the past 2 weeks.  She uses her CPAP machine at night and thus does not have morning headaches.  The headaches come on in the early afternoon while at work.  She takes 2 Aleve at that time and gets relief for 1-2 hours.  However, when she leaves work around [employer], she states the headache is back so she goes home and has a cup of coffee and takes 2 more Aleve.  She then gets relief from the headache for the rest of the evening until the following afternoon.  She describes the pain as a sharp 8/10 pressure-like pain throughout her entire head.  She notes that she hit her head on her sewing machine about 1 month ago which is what she believes set off her current series of headaches.  Pt endorses photo/phonophobia, denies visual aura, fever/chills, nausea/vomiting, weakness, LOC.    Pt states she has seasonal allergies and was taking Claritin earlier this spring.  She has not been taking allergy medication recently.  Pt states she has taken low dose amitriptyline at bedtime for headaches in the past which greatly helped.   She is still smoking about 1/2 PPD but desires to quit smoking.  She states that the patches and gum are too expensive since her insurance company does not cover them.  We discussed the 1800QUITNOW hotline that may provide free patches.      Past Medical History  Diagnosis Date  . Depression   . History of migraine headaches    Current Outpatient Prescriptions  Medication Sig Dispense Refill  . albuterol (PROVENTIL HFA;VENTOLIN HFA) 108 (90 BASE) MCG/ACT inhaler Inhale 2 puffs into the lungs every 6 (six) hours as needed for  wheezing.  1 Inhaler  2  . aspirin 81 MG EC tablet Take 81 mg by mouth daily.        Marland Kitchen atorvastatin (LIPITOR) 20 MG tablet TAKE 1 TABLET (20 MG TOTAL) BY MOUTH DAILY.  30 tablet  5  . famotidine (PEPCID) 20 MG tablet Take 20 mg by mouth at bedtime as needed.      Marland Kitchen FLUoxetine (PROZAC) 20 MG capsule Take 1 capsule (20 mg total) by mouth daily.  30 capsule  3  . naproxen (NAPROSYN) 375 MG tablet Take 1 tablet (375 mg total) by mouth 2 (two) times daily with a meal.  60 tablet  2  . triamterene-hydrochlorothiazide (DYAZIDE) 37.5-25 MG per capsule Take 1 each (1 capsule total) by mouth daily.  90 capsule  3   No current facility-administered medications for this visit.   Family History  Problem Relation Age of Onset  . Hypertension Mother   . Stroke Mother   . Coronary artery disease Mother   . Heart disease Father   . Diabetes Sister   . Hypertension Sister   . Cancer Sister    History   Social History  . Marital Status: Married    Spouse Name: N/A    Number of Children: N/A  . Years of Education: N/A   Social History Main Topics  . Smoking status: Current Every Day  Smoker -- 0.50 packs/day for 35 years    Types: Cigarettes  . Smokeless tobacco: Not on file     Comment: DOWN TO 1/2PACK PER DAY  . Alcohol Use: No  . Drug Use: No  . Sexually Active: Not on file   Other Topics Concern  . Not on file   Social History Narrative   Married, Regular Exercise- yes   Review of Systems: Review of Systems  Constitutional: Negative for fever, chills and diaphoresis.  HENT: Positive for congestion. Negative for hearing loss, sore throat and neck pain.   Eyes: Negative for blurred vision and double vision.  Respiratory: Negative for cough and shortness of breath.   Cardiovascular: Negative for chest pain and palpitations.  Gastrointestinal: Negative for heartburn, nausea, vomiting, abdominal pain, diarrhea, constipation and blood in stool.  Genitourinary: Negative for dysuria.    Musculoskeletal: Negative for back pain and falls.  Neurological: Positive for dizziness and headaches. Negative for tingling, focal weakness, loss of consciousness and weakness.    Objective:  Physical Exam: Filed Vitals:   11/03/12 1444  BP: 150/94  Pulse: 74  Temp: 97.6 F (36.4 C)  TempSrc: Oral  Height: 5' 4"  (1.626 m)  Weight: 156 lb 3.2 oz (70.852 kg)  SpO2: 99%   General: alert, cooperative, and in no apparent distress HEENT: pupils equal round and reactive to light, vision grossly intact, oropharynx clear and non-erythematous  Neck: supple, no lymphadenopathy, JVD, or carotid bruits Lungs: clear to ascultation bilaterally, normal work of respiration, no wheezes, rales, ronchi Heart: regular rate and rhythm, no murmurs, gallops, or rubs Abdomen: soft, non-tender, non-distended, normal bowel sounds Extremities: no cyanosis, clubbing, or edema Neurologic: alert & oriented X3, cranial nerves II-XII intact, strength 5/5 throughout, sensation intact to light touch, FTN normal   Assessment & Plan:  Patient discussed with Dr. Stann Mainland.  Please see problem-based assessment and plan.

## 2012-11-03 NOTE — Assessment & Plan Note (Signed)
Pt using her CPAP every night and is tolerating well.  States that she feels rested when she wakes up and is no longer having severe morning headaches that she had prior to CPAP use.

## 2012-11-03 NOTE — Assessment & Plan Note (Addendum)
Pt is taking Lipitor 20 mg daily, no side effects.   -consider rechecking lipid panel at PCP follow-up visit

## 2012-11-03 NOTE — Patient Instructions (Addendum)
Follow-up in 1-2 months with Dr. Lynnae January.   Continue taking two Aleve twice daily as you have been doing for headache.  You can take one Benadryl at bedtime to help with congestion as well.  If this is not working after a week or so, you may switch to low dose amitriptyline as we discussed.   Keep up the good work on your blood pressure medicine!  Tension Headache A tension headache is a feeling of pain, pressure, or aching often felt over the front and sides of the head. The pain can be dull or can feel tight (constricting). It is the most common type of headache. Tension headaches are not normally associated with nausea or vomiting and do not get worse with physical activity. Tension headaches can last 30 minutes to several days.  CAUSES  The exact cause is not known, but it may be caused by chemicals and hormones in the brain that lead to pain. Tension headaches often begin after stress, anxiety, or depression. Other triggers may include:  Alcohol.  Caffeine (too much or withdrawal).  Respiratory infections (colds, flu, sinus infections).  Dental problems or teeth clenching.  Fatigue.  Holding your head and neck in one position too long while using a computer. SYMPTOMS   Pressure around the head.   Dull, aching head pain.   Pain felt over the front and sides of the head.   Tenderness in the muscles of the head, neck, and shoulders. DIAGNOSIS  A tension headache is often diagnosed based on:   Symptoms.   Physical examination.   A CT scan or MRI of your head. These tests may be ordered if symptoms are severe or unusual. TREATMENT  Medicines may be given to help relieve symptoms.  HOME CARE INSTRUCTIONS   Only take over-the-counter or prescription medicines for pain or discomfort as directed by your caregiver.   Lie down in a dark, quiet room when you have a headache.   Keep a journal to find out what may be triggering your headaches. For example, write  down:  What you eat and drink.  How much sleep you get.  Any change to your diet or medicines.  Try massage or other relaxation techniques.   Ice packs or heat applied to the head and neck can be used. Use these 3 to 4 times per day for 15 to 20 minutes each time, or as needed.   Limit stress.   Sit up straight, and do not tense your muscles.   Quit smoking if you smoke.  Limit alcohol use.  Decrease the amount of caffeine you drink, or stop drinking caffeine.  Eat and exercise regularly.  Get 7 to 9 hours of sleep, or as recommended by your caregiver.  Avoid excessive use of pain medicine as recurrent headaches can occur.  SEEK MEDICAL CARE IF:   You have problems with the medicines you were prescribed.  Your medicines do not work.  You have a change from the usual headache.  You have nausea or vomiting. SEEK IMMEDIATE MEDICAL CARE IF:   Your headache becomes severe.  You have a fever.  You have a stiff neck.  You have loss of vision.  You have muscular weakness or loss of muscle control.  You lose your balance or have trouble walking.  You feel faint or pass out.  You have severe symptoms that are different from your first symptoms. MAKE SURE YOU:   Understand these instructions.  Will watch your condition.  Will  get help right away if you are not doing well or get worse. Document Released: 03/11/2005 Document Revised: 06/03/2011 Document Reviewed: 03/01/2011 [facility] Patient Information 2014 [place], [place].

## 2012-11-03 NOTE — Assessment & Plan Note (Signed)
BP Readings from Last 3 Encounters:  11/03/12 150/94  07/16/12 153/99  07/03/12 157/92    Lab Results  Component Value Date   NA 138 07/03/2012   K 3.8 07/03/2012   CREATININE 0.73 07/03/2012    Assessment: Pt compliant with her Diazide.  BP today may be increased by pt's current headache.  Blood pressure control: mildly elevated Progress toward BP goal:  improved  Plan: Medications:  continue current medications Self management tools provided: home blood pressure logbook

## 2012-11-03 NOTE — Assessment & Plan Note (Addendum)
Assessment: Pt's headaches are likely tension headaches with possible sinus component.  She has had multiple negative head CTs in past. Her BP is controlled. Headaches are well-controlled with Aleve 2 tablets twice daily.  Not concerning for migraine given daily occurrence.  Pt asking if she could take benadryl or amitriptyline at bedtime in addition to Aleve.  Benadryl seems reasonable given that she does have intermittent allergy symptoms, no current congestion, nasal discharge, or cough.  Pt would like to try this first and if she does not get relief from her headaches in 1-2 weeks, will switch to low dose amitriptyline (103m) at bedtime.  She was prescribed this medication previously and thus still has some at home.   Plan: continue Aleve 2 tablets twice daily as needed for headaches.  Pt will try taking one benadryl at bedtime for next week or so.  If she still has not gotten relief from her daily headaches, she will switch to low dose amitriptyline at bedtime.

## 2012-11-03 NOTE — Progress Notes (Signed)
I saw and evaluated the patient.  I personally confirmed the key portions of the history and exam documented by Dr. Stann Mainland and I reviewed pertinent patient test results.  The assessment, diagnosis, and plan were formulated together and I agree with the documentation in the resident's note.

## 2012-12-01 ENCOUNTER — Other Ambulatory Visit: Payer: Self-pay | Admitting: *Deleted

## 2012-12-02 MED ORDER — FLUOXETINE HCL 20 MG PO CAPS: 20.0000 mg | ORAL_CAPSULE | Freq: Every day | ORAL | Status: DC

## 2012-12-25 ENCOUNTER — Encounter: Payer: 59 | Admitting: Internal Medicine

## 2013-02-10 ENCOUNTER — Ambulatory Visit (INDEPENDENT_AMBULATORY_CARE_PROVIDER_SITE_OTHER): Payer: 59 | Admitting: Internal Medicine

## 2013-02-10 VITALS — BP 138/81 | HR 84 | Temp 97.6°F | Ht 64.0 in | Wt 161.7 lb

## 2013-02-10 DIAGNOSIS — J479 Bronchiectasis, uncomplicated: Secondary | ICD-10-CM

## 2013-02-10 MED ORDER — AZITHROMYCIN 250 MG PO TABS: ORAL_TABLET | ORAL | Status: DC

## 2013-02-10 MED ORDER — HYDROCOD POLST-CHLORPHEN POLST 10-8 MG/5ML PO LQCR: 5.0000 mL | Freq: Two times a day (BID) | ORAL | Status: DC | PRN

## 2013-02-10 MED ORDER — BENZONATATE 100 MG PO CAPS: 100.0000 mg | ORAL_CAPSULE | Freq: Three times a day (TID) | ORAL | Status: DC | PRN

## 2013-02-10 NOTE — Patient Instructions (Addendum)
We will prescribe antibiotics and cough suppressants today. If you are not improving in ~3 days please contact the clinic for further evaluation. Follow-up with your Primary Doctor in 2 weeks.

## 2013-02-10 NOTE — Progress Notes (Signed)
  Subjective:    Patient ID: Debra Rodgers, female    DOB: 31-May-1945, 67 y.o.   MRN: [id number]  HPI  Hx significant for bronchiectasis with frequent exacerbations, last one April 2014.  Pt states that she has had increase in cough and clear sputum production with pains on her sides during coughing.  Denies fever but says she may had some chills.  Afebrile on exam.  Denies hemoptysis, shortness, or chest pain.  Continues to smoke ~1/2ppd.  Review of Systems  Constitutional: Positive for chills. Negative for fever.  HENT: Positive for congestion and rhinorrhea. Negative for sinus pressure.   Eyes: Negative.   Respiratory: Positive for cough. Negative for shortness of breath and wheezing.   Cardiovascular:       Bilateral rib pain when coughing  Gastrointestinal: Negative.   Endocrine: Negative.   Genitourinary: Negative.   Musculoskeletal: Negative.   Skin: Negative.   Allergic/Immunologic: Negative.   Neurological: Negative.   Hematological: Negative.   Psychiatric/Behavioral: Negative.         Objective:   Physical Exam  Constitutional: She is oriented to person, place, and time. She appears well-developed and well-nourished. No distress.  Pleasant AA female  HENT:  Head: Normocephalic and atraumatic.  Right Ear: External ear normal.  Left Ear: External ear normal.  Mouth/Throat: Oropharynx is clear and moist.  Eyes: Conjunctivae and EOM are normal. Pupils are equal, round, and reactive to light.  Neck: Neck supple.  Cardiovascular: Normal rate, regular rhythm and normal heart sounds.   Pulmonary/Chest: Effort normal. She has rales.  Rhonchi appreciated over right mid and lower lung fields  Abdominal: Soft. Bowel sounds are normal.  Musculoskeletal: She exhibits no edema and no tenderness.  Neurological: She is alert and oriented to person, place, and time.  Skin: Skin is warm and dry.  Psychiatric: She has a normal mood and affect. Her behavior is normal. Judgment and  thought content normal.          Assessment & Plan:  See separate problem list charting:  #1 acute bronchitis versus exacerbation of bronchiectasis: Per at the notes patient has bronchiectasis but no CT imaging. Also could not locate PFTs if they had been done.  Patient reports this feels like her prior episodes. Oxygenation 98% on room air. Thus will treat with azithromycin and cough suppressants. -Z-Pak -Tessalon Perles -Tussionex -Continue albuterol inhaler when necessary -Will forward note to PCP concerning need for imaging or PFTs

## 2013-02-10 NOTE — Assessment & Plan Note (Signed)
Rhonchi over right mid-lower lung fields. Clear sputum production. Bilateral rib pain when coughing. No fever or shortness of breath. Continues to smoke.  Hx significant for bronchiectasis flares.  States that this feels like episode in April 2014 which resolved with Z-pac and Tussionex. -Rx Z-pac -Tussionex -Tessalon Perles -if no improvement in 3-5 days consider CXR r/o pneumonia

## 2013-02-26 ENCOUNTER — Ambulatory Visit (HOSPITAL_COMMUNITY)
Admission: RE | Admit: 2013-02-26 | Discharge: 2013-02-26 | Disposition: A | Discharge status: Home / Self Care | Payer: 59 | Source: Ambulatory Visit | Attending: Internal Medicine | Admitting: Internal Medicine

## 2013-02-26 ENCOUNTER — Ambulatory Visit (INDEPENDENT_AMBULATORY_CARE_PROVIDER_SITE_OTHER): Payer: 59 | Admitting: Internal Medicine

## 2013-02-26 ENCOUNTER — Encounter: Payer: Self-pay | Admitting: Internal Medicine

## 2013-02-26 VITALS — BP 134/88 | HR 70 | Temp 98.0°F | Ht 64.0 in | Wt 163.5 lb

## 2013-02-26 DIAGNOSIS — R059 Cough, unspecified: Secondary | ICD-10-CM | POA: Insufficient documentation

## 2013-02-26 DIAGNOSIS — J189 Pneumonia, unspecified organism: Secondary | ICD-10-CM

## 2013-02-26 DIAGNOSIS — J209 Acute bronchitis, unspecified: Secondary | ICD-10-CM

## 2013-02-26 DIAGNOSIS — R0989 Other specified symptoms and signs involving the circulatory and respiratory systems: Secondary | ICD-10-CM | POA: Insufficient documentation

## 2013-02-26 DIAGNOSIS — J479 Bronchiectasis, uncomplicated: Secondary | ICD-10-CM

## 2013-02-26 DIAGNOSIS — R05 Cough: Secondary | ICD-10-CM | POA: Insufficient documentation

## 2013-02-26 MED ORDER — LEVOFLOXACIN 750 MG PO TABS: 750.0000 mg | ORAL_TABLET | Freq: Every day | ORAL | Status: AC

## 2013-02-26 MED ORDER — HYDROCOD POLST-CHLORPHEN POLST 10-8 MG/5ML PO LQCR: 5.0000 mL | Freq: Two times a day (BID) | ORAL | Status: DC | PRN

## 2013-02-26 MED ORDER — BENZONATATE 100 MG PO CAPS: 100.0000 mg | ORAL_CAPSULE | Freq: Three times a day (TID) | ORAL | Status: DC | PRN

## 2013-02-26 NOTE — Patient Instructions (Signed)

## 2013-02-26 NOTE — Progress Notes (Signed)
   Subjective:    Patient ID: Debra Rodgers, female    DOB: 1946/01/21, 67 y.o.   MRN: [id number]  HPI Debra Rodgers is a 67 year old woman with a past medical history of hypertension, GERD, bronchiectasis, and headaches who presents for ongoing cough and left sided chest pain. Patient describes that ever since her last visit on 11/19 for evaluation of a cough and pleuritic chest pain she was given Tessalon Perles, Tussionex and azithromycin for 5 days and did not have complete resolution of her symptoms since that time she's also continued to have night sweats and some chills and "heat flashes." She has not had any objective measures of fevers at home. She does continue to smoke about half pack per day and has had to use her albuterol inhaler at least 5 times within the last week. She is still able to ambulate without significant shortness of breath. She denied any palpitations, hemoptysis, headache, nausea/vomiting/diarrhea, or lower extremity edema. In terms of her chest pain she describes it as worse with deep inspiration sharp localized to the left lower rib and sometimes exacerbated with cough. She has not had any recent sick contacts or travel. 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 She still been able to work throughout this time with little interruption given her symptoms.   Review of Systems  Constitutional: Positive for fever, chills and fatigue. Negative for appetite change and unexpected weight change.  HENT: Positive for congestion and postnasal drip. Negative for ear pain, rhinorrhea, sinus pressure, sneezing and sore throat.   Respiratory: Positive for cough (productive of clear sputum).   Cardiovascular: Positive for chest pain (plueritic). Negative for palpitations.  Gastrointestinal: Negative for nausea, vomiting, diarrhea and constipation.  Musculoskeletal: Negative for arthralgias.  Neurological: Negative  for weakness, light-headedness and headaches.       Objective:   Physical Exam Filed Vitals:   02/26/13 1538  BP:   Pulse: 70  Temp:     General: sitting in chair comfortably, NAD HEENT: PERRL, EOMI, no scleral icterus Cardiac: RRR, no rubs, murmurs or gallops Pulm: some wheezes in LLL and slight crackles, upper respiratory noise radiating through upper lung fields, moving normal volumes of air Abd: soft, nontender, nondistended, BS present Ext: warm and well perfused, no pedal edema Neuro: alert and oriented X3, cranial nerves II-XII grossly intact     Assessment & Plan:  1. CAP vs brochiectasis : given the symptomatology does not seem to be a COPD exacerbation as patient has had no increased shortness of breath with activity or increased sputum production. DDx includes suspicion for bronchiectasis given tram tracking on CXR and possibility of pseudomonal infection.   She does describe pleuritic chest pain and continued cough worrisome for pneumonia. Chest x-ray today revealed tram tracking in the LUL. Her vitals have been stable with no marked hypoxia or fever. Given that she recently completed azithromycin w/in the last 3 months will need to consider a different agent for a longer time. -levofloxacin 732m qd for 5 days -CXR -tessalon pearls -Tussinex  Pt discussed with Debra Rodgers

## 2013-02-28 DIAGNOSIS — J209 Acute bronchitis, unspecified: Secondary | ICD-10-CM | POA: Insufficient documentation

## 2013-02-28 NOTE — Progress Notes (Signed)
Case discussed with Dr. Algis Liming soon after the time of the visit. We reviewed the resident's history and exam and pertinent patient test results. I agree with the assessment, diagnosis and plan of care documented in the resident's note with the exception that the working diagnosis is an acute bronchitis Vs bronchiectasis exacerbation in the setting of LUL tram-tracking.

## 2013-02-28 NOTE — Addendum Note (Signed)
Addended by: Oval Linsey D on: 02/28/2013 08:55 AM   Modules accepted: Level of Service

## 2013-03-15 ENCOUNTER — Other Ambulatory Visit: Payer: Self-pay | Admitting: *Deleted

## 2013-03-16 MED ORDER — TRIAMTERENE-HCTZ 37.5-25 MG PO CAPS: 1.0000 | ORAL_CAPSULE | Freq: Every day | ORAL | Status: DC

## 2013-05-21 ENCOUNTER — Encounter (HOSPITAL_COMMUNITY): Payer: Self-pay | Admitting: Emergency Medicine

## 2013-05-21 ENCOUNTER — Emergency Department (HOSPITAL_COMMUNITY): Payer: Worker's Compensation

## 2013-05-21 ENCOUNTER — Emergency Department (HOSPITAL_COMMUNITY)
Admission: EM | Admit: 2013-05-21 | Discharge: 2013-05-22 | Disposition: A | Discharge status: Home / Self Care | Payer: Worker's Compensation | Attending: Emergency Medicine | Admitting: Emergency Medicine

## 2013-05-21 ENCOUNTER — Emergency Department (INDEPENDENT_AMBULATORY_CARE_PROVIDER_SITE_OTHER)
Admission: EM | Admit: 2013-05-21 | Discharge: 2013-05-21 | Disposition: A | Discharge status: Nursing Home (Includes ICF) | Payer: Worker's Compensation | Source: Home / Self Care | Attending: Emergency Medicine | Admitting: Emergency Medicine

## 2013-05-21 DIAGNOSIS — S060X0A Concussion without loss of consciousness, initial encounter: Secondary | ICD-10-CM | POA: Diagnosis not present

## 2013-05-21 DIAGNOSIS — Z79899 Other long term (current) drug therapy: Secondary | ICD-10-CM | POA: Insufficient documentation

## 2013-05-21 DIAGNOSIS — F329 Major depressive disorder, single episode, unspecified: Secondary | ICD-10-CM | POA: Insufficient documentation

## 2013-05-21 DIAGNOSIS — IMO0002 Reserved for concepts with insufficient information to code with codable children: Secondary | ICD-10-CM | POA: Insufficient documentation

## 2013-05-21 DIAGNOSIS — F17200 Nicotine dependence, unspecified, uncomplicated: Secondary | ICD-10-CM | POA: Insufficient documentation

## 2013-05-21 DIAGNOSIS — Z8679 Personal history of other diseases of the circulatory system: Secondary | ICD-10-CM | POA: Insufficient documentation

## 2013-05-21 DIAGNOSIS — Y990 Civilian activity done for income or pay: Secondary | ICD-10-CM | POA: Diagnosis not present

## 2013-05-21 DIAGNOSIS — Y9389 Activity, other specified: Secondary | ICD-10-CM | POA: Diagnosis not present

## 2013-05-21 DIAGNOSIS — R209 Unspecified disturbances of skin sensation: Secondary | ICD-10-CM | POA: Diagnosis not present

## 2013-05-21 DIAGNOSIS — S0990XA Unspecified injury of head, initial encounter: Secondary | ICD-10-CM

## 2013-05-21 DIAGNOSIS — Y929 Unspecified place or not applicable: Secondary | ICD-10-CM | POA: Diagnosis not present

## 2013-05-21 DIAGNOSIS — R42 Dizziness and giddiness: Secondary | ICD-10-CM | POA: Insufficient documentation

## 2013-05-21 DIAGNOSIS — F3289 Other specified depressive episodes: Secondary | ICD-10-CM | POA: Diagnosis not present

## 2013-05-21 DIAGNOSIS — S060XAA Concussion with loss of consciousness status unknown, initial encounter: Secondary | ICD-10-CM

## 2013-05-21 DIAGNOSIS — Z7982 Long term (current) use of aspirin: Secondary | ICD-10-CM | POA: Insufficient documentation

## 2013-05-21 DIAGNOSIS — S060X9A Concussion with loss of consciousness of unspecified duration, initial encounter: Secondary | ICD-10-CM

## 2013-05-21 DIAGNOSIS — R110 Nausea: Secondary | ICD-10-CM | POA: Insufficient documentation

## 2013-05-21 NOTE — ED Notes (Signed)
Pt states she hit L side of head on cabinet at work on Wednesday.  C/o headache, nausea, "a little" blurred vision, and dizziness since hitting head.  Denies LOC.  Pt seen at [facility] and sent to ED.

## 2013-05-21 NOTE — ED Notes (Signed)
Notified Dr. Dorna Mai of pt and verbal order received for CT head.  Explained wait for treatment room to pt and notified her that she is waiting for head CT.

## 2013-05-21 NOTE — ED Notes (Signed)
Reports hitting left side of head on a cabinet.  Having nausea.  Headache.  And mild pain.   Denies any loss of consciousness.  Pt is taking aleve for pain relief.

## 2013-05-21 NOTE — Discharge Instructions (Signed)
We have determined that your problem requires further evaluation in the emergency department.  We will take care of your transport there.  Once at the emergency department, you will be evaluated by a provider and they will order whatever treatment or tests they deem necessary.  We cannot guarantee that they will do any specific test or do any specific treatment.  ° °

## 2013-05-21 NOTE — ED Provider Notes (Signed)
Chief Complaint   Chief Complaint  Patient presents with  . Head Injury    History of Present Illness   Debra Rodgers is a 68 year old female who injured her head at work 3 days ago. This happened around 12:10 PM. 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 The patient states that she had bent down to pick up some coins that had fallen on the floor and when she raised up she struck her head on the corner of a cabinet. There was no loss of consciousness but she did see stars for a few seconds. Ever since then she's had a severe left-sided headache, she had puffiness around her eyes, her vision has been blurry, she had some tingling in her left hand, feels dizzy, off balance, and has difficulty with ambulation. She denies any bleeding from her ears although she did blow little bit of blood out of her nose. She's had no neck pain. No difficulty with speech. She's unsteady when she walks. She denies any muscle weakness. She's felt nauseated but has not vomited.  Review of Systems   Other than as noted above, the patient denies any of the following symptoms: Eye:  No eye pain, diplopia or blurred vision ENT:  No bleeding from nose or ears.  No loose or broken teeth. Neck:  No pain or limited ROM. GI:  No nausea or vomiting. Neuro:  No loss of consciousness, seizure activity, numbness, tingling, or weakness.  [place]   Past medical history, family history, social history, meds, and allergies were reviewed. She has a history of high blood pressure, high cholesterol, GERD, allergies, and depression. Current meds include aspirin 81 mg a day, Lipitor, Pepcid, Prozac, Dyazide, and she has no albuterol inhaler. She's allergic to codeine and pantoprazole. She does not take Coumadin or any other anticoagulant except for the aspirin.  Physical Examination   Vital signs:  BP 153/98  Pulse 66  Temp(Src) 98.6 F (37 C) (Oral)  Resp 16  SpO2 100% General:  Alert and oriented  times 3.  In no distress. Eye:  PERRL, full EOMs.  Lids and conjunctivas normal. Fundi benign. HEENT:  There is tenderness to palpation in the left parietal area. There is no laceration or bleeding, no bump or hematoma was felt.  TMs and canals normal, nasal mucosa normal.  No oral lacerations.  Teeth were intact without obvious oral trauma. Neck:  Non tender.  Full ROM without pain. Neurological:  Alert and oriented.  Cranial nerves intact.  No pronator drift. Finger to nose test was normal on the right, she did have some past pointing on finger to nose on the left.  No muscle weakness. DTRs were symmetrical.  Sensation was intact to light touch.  She's unsteady when she gets up to walk. She leans to the right when she stands up straight. She's unable to perform Romberg sign or tandem gait.  Assessment   The encounter diagnosis was Head injury.  Due to age and multiple symptoms, she is at high risk, and she will need a CT scan.  Plan   The patient was transferred to the ED via shuttle in stable condition.  Medical Decision Making   68 year old female sustained a head injury 3 days ago on the job.  She was bending over to pick up coins, raised up and hit her head on a cabinet.  No LOC, but did "see stars."  Since then has had severe left sided  headache, blurry vision, dizziness, is unsteady on feet, feels nauseated and has numbness of left hand.  On exam she is ataxic on finger to nose on left, and unable to do Romberg's maneuver or tandem gait.  In view of age and symptoms, she needs further evaluation, and will need a CT scan.     Harden Mo, MD 05/21/13 [id number]

## 2013-05-22 ENCOUNTER — Telehealth (HOSPITAL_COMMUNITY): Payer: Self-pay

## 2013-05-22 MED ORDER — TRAMADOL HCL 50 MG PO TABS: 50.0000 mg | ORAL_TABLET | Freq: Four times a day (QID) | ORAL | Status: DC | PRN

## 2013-05-22 MED ORDER — TRAMADOL HCL 50 MG PO TABS
50.0000 mg | ORAL_TABLET | Freq: Four times a day (QID) | ORAL | Status: DC | PRN
  Administered 2013-05-22: 50 mg via ORAL
  Filled 2013-05-22: qty 1

## 2013-05-22 MED ORDER — ONDANSETRON 4 MG PO TBDP
8.0000 mg | ORAL_TABLET | Freq: Once | ORAL | Status: AC
  Administered 2013-05-22: 8 mg via ORAL
  Filled 2013-05-22: qty 2

## 2013-05-22 NOTE — ED Notes (Signed)
Patient came in today stating ED last her Worker's comp paperwork.  Chart reviewed there was not documentation about said paperwork.  Patient made aware we did not have paperwork.  Patient expressed understanding and did not have any further questions.

## 2013-05-22 NOTE — ED Provider Notes (Signed)
CSN: [id number]     Arrival date & time 05/21/13  1900 History   First MD Initiated Contact with Patient 05/21/13 2332     Chief Complaint  Patient presents with  . Headache  . Nausea  . Head Injury     (Consider location/radiation/quality/duration/timing/severity/associated sxs/prior Treatment) HPI 68 yo female presents to the ER from urgent care with reported head injury.  Pt reports she struck the left side of her head on Wednesday, 3 days ago, as she stood up from picking up coins.  No LOC.  Since that time she has had intermittent episodes of dizziness, tingling in left hand, nausea as well as persistent severe headache.  Dizziness is not positional.  Tingling occurs starting at fingertips and extending up to mid forearm, lasting 15 minutes or so at a time.  Tingling only on left hand.  Pt has h/o migraines, but reports this headache is different.   Past Medical History  Diagnosis Date  . Depression   . History of migraine headaches    Past Surgical History  Procedure Laterality Date  . Cholecystectomy    . Abdominal hysterectomy    . Rotator cuff repair  3/04   Family History  Problem Relation Age of Onset  . Hypertension Mother   . Stroke Mother   . Coronary artery disease Mother   . Heart disease Father   . Diabetes Sister   . Hypertension Sister   . Cancer Sister    History  Substance Use Topics  . Smoking status: Current Every Day Smoker -- 0.50 packs/day for 35 years    Types: Cigarettes  . Smokeless tobacco: Not on file     Comment: DOWN TO 1/2PACK PER DAY  . Alcohol Use: No   OB History   Grav Para Term Preterm Abortions TAB SAB Ect Mult Living                 Review of Systems  All other systems reviewed and are negative.      Allergies  Codeine sulfate and Pantoprazole sodium  Home Medications   Current Outpatient Rx  Name  Route  Sig  Dispense  Refill  . albuterol (PROVENTIL HFA;VENTOLIN HFA) 108 (90 BASE) MCG/ACT inhaler   Inhalation  Inhale 1-2 puffs into the lungs every 6 (six) hours as needed for wheezing or shortness of breath.         Marland Kitchen aspirin 81 MG EC tablet   Oral   Take 81 mg by mouth daily.           Marland Kitchen atorvastatin (LIPITOR) 20 MG tablet   Oral   Take 20 mg by mouth daily.         . famotidine (PEPCID) 20 MG tablet   Oral   Take 20 mg by mouth daily.          Marland Kitchen FLUoxetine (PROZAC) 20 MG capsule   Oral   Take 20 mg by mouth daily.         Marland Kitchen loratadine (CLARITIN) 10 MG tablet   Oral   Take 10 mg by mouth daily as needed for allergies.         . naproxen sodium (ANAPROX) 220 MG tablet   Oral   Take 440 mg by mouth daily as needed (for headache).         . triamterene-hydrochlorothiazide (DYAZIDE) 37.5-25 MG per capsule   Oral   Take 1 capsule by mouth daily.         Marland Kitchen  traMADol (ULTRAM) 50 MG tablet   Oral   Take 1 tablet (50 mg total) by mouth every 6 (six) hours as needed for moderate pain.   30 tablet   0    BP 161/89  Pulse 64  Temp(Src) 98.4 F (36.9 C) (Oral)  Resp 18  Ht 5' 3"  (1.6 m)  Wt 161 lb 4 oz (73.143 kg)  BMI 28.57 kg/m2  SpO2 98% Physical Exam  Nursing note and vitals reviewed. Constitutional: She is oriented to person, place, and time. She appears well-developed and well-nourished.  HENT:  Head: Normocephalic and atraumatic.  Right Ear: External ear normal.  Left Ear: External ear normal.  Nose: Nose normal.  Mouth/Throat: Oropharynx is clear and moist.  No swelling or contusion noted, mild ttp over parietal region.  No neck stiffness or crepitus.  No change in dizziness with head/neck movement.  No pain, tingling with rotation or extension/flexion of the neck  Eyes: Conjunctivae and EOM are normal. Pupils are equal, round, and reactive to light.  Neck: Normal range of motion. Neck supple. No JVD present. No tracheal deviation present. No thyromegaly present.  Cardiovascular: Normal rate, regular rhythm, normal heart sounds and intact distal pulses.   Exam reveals no gallop and no friction rub.   No murmur heard. Pulmonary/Chest: Effort normal and breath sounds normal. No stridor. No respiratory distress. She has no wheezes. She has no rales. She exhibits no tenderness.  Abdominal: Soft. Bowel sounds are normal. She exhibits no distension and no mass. There is no tenderness. There is no rebound and no guarding.  Musculoskeletal: Normal range of motion. She exhibits no edema and no tenderness.  Lymphadenopathy:    She has no cervical adenopathy.  Neurological: She is alert and oriented to person, place, and time. She has normal reflexes. No cranial nerve deficit. She exhibits normal muscle tone. Coordination normal.  Finger nose finger and romberg normal.  Normal gait  Skin: Skin is warm and dry. No rash noted. No erythema. No pallor.  Psychiatric: She has a normal mood and affect. Her behavior is normal. Judgment and thought content normal.    ED Course  Procedures (including critical care time) Labs Review Labs Reviewed - No data to display Imaging Review Ct Head Wo Contrast  05/21/2013   CLINICAL DATA:  Head injury.  Nausea and dizziness.  EXAM: CT HEAD WITHOUT CONTRAST  TECHNIQUE: Contiguous axial images were obtained from the base of the skull through the vertex without intravenous contrast.  COMPARISON:  09/04/2009  FINDINGS: Ventricles are normal in size and configuration. There are no parenchymal masses or mass effect. There is no evidence of a cortical infarct. There are no extra-axial masses or abnormal fluid collections.  There is no intracranial hemorrhage.  Visualized sinuses and mastoid air cells are clear.  No skull fracture.  IMPRESSION: Normal CT scan of the brain for age.   Electronically Signed   By: Lajean Manes M.D.   On: 05/21/2013 21:47     EKG Interpretation None      MDM   Final diagnoses:  Concussion    68 year old female status post head injury 3 days ago, sent from urgent care, to 2 some persistent  symptoms.  Patient has had some dizziness, nausea, and headache in the area, where she struck her head.  CT scan unremarkable.  Symptoms seem consistent with postconcussive syndrome.  She is feeling better after pain medication.  Plan is to have her followup outpatient with neurology.  Her blood  pressure has been slightly elevated during her ED visit, she is encouraged followup with her primary care Dr. for followup on her hypertension    Kalman Drape, MD 05/24/13 1259

## 2013-05-22 NOTE — ED Notes (Signed)
Headache is better

## 2013-05-22 NOTE — ED Notes (Signed)
The pt has had a headache since Wednesday when she struck her head on a cabinet.  No loc.   Sl nausea none now.  Family at the bedside

## 2013-05-22 NOTE — ED Notes (Signed)
Med given for nausea and pain

## 2013-05-22 NOTE — Discharge Instructions (Signed)
Concussion, Adult A concussion, or closed-head injury, is a brain injury caused by a direct blow to the head or by a quick and sudden movement (jolt) of the head or neck. Concussions are usually not life-threatening. Even so, the effects of a concussion can be serious. If you have had a concussion before, you are more likely to experience concussion-like symptoms after a direct blow to the head.  CAUSES   Direct blow to the head, such as from running into another player during a soccer game, being hit in a fight, or hitting your head on a hard surface.  A jolt of the head or neck that causes the brain to move back and forth inside the skull, such as in a car crash. SIGNS AND SYMPTOMS  The signs of a concussion can be hard to notice. Early on, they may be missed by you, family members, and health care providers. You may look fine but act or feel differently. Symptoms are usually temporary, but they may last for days, weeks, or even longer. Some symptoms may appear right away while others may not show up for hours or days. Every head injury is different. Symptoms include:   Mild to moderate headaches that will not go away.  A feeling of pressure inside your head.  Having more trouble than usual:   Learning or remembering things you have heard.  Answering questions.  Paying attention or concentrating.   Organizing daily tasks.   Making decisions and solving problems.   Slowness in thinking, acting or reacting, speaking, or reading.   Getting lost or being easily confused.   Feeling tired all the time or lacking energy (fatigued).   Feeling drowsy.   Sleep disturbances.   Sleeping more than usual.   Sleeping less than usual.   Trouble falling asleep.   Trouble sleeping (insomnia).   Loss of balance or feeling lightheaded or dizzy.   Nausea or vomiting.   Numbness or tingling.   Increased sensitivity to:   Sounds.   Lights.   Distractions.    Vision problems or eyes that tire easily.   Diminished sense of taste or smell.   Ringing in the ears.   Mood changes such as feeling sad or anxious.   Becoming easily irritated or angry for little or no reason.   Lack of motivation.  Seeing or hearing things other people do not see or hear (hallucinations). DIAGNOSIS  Your health care provider can usually diagnose a concussion based on a description of your injury and symptoms. He or she will ask whether you passed out (lost consciousness) and whether you are having trouble remembering events that happened right before and during your injury.  Your evaluation might include:   A brain scan to look for signs of injury to the brain. Even if the test shows no injury, you may still have a concussion.   Blood tests to be sure other problems are not present. TREATMENT   Concussions are usually treated in an emergency department, in urgent care, or at a clinic. You may need to stay in the hospital overnight for further treatment.   Tell your health care provider if you are taking any medicines, including prescription medicines, over-the-counter medicines, and natural remedies. Some medicines, such as blood thinners (anticoagulants) and aspirin, may increase the chance of complications. Also tell your health care provider whether you have had alcohol or are taking illegal drugs. This information may affect treatment.  Your health care provider will send you  home with important instructions to follow.  How fast you will recover from a concussion depends on many factors. These factors include how severe your concussion is, what part of your brain was injured, your age, and how healthy you were before the concussion.  Most people with mild injuries recover fully. Recovery can take time. In general, recovery is slower in older persons. Also, persons who have had a concussion in the past or have other medical problems may find that it  takes longer to recover from their current injury. HOME CARE INSTRUCTIONS  General Instructions  Carefully follow the directions your health care provider gave you.  Only take over-the-counter or prescription medicines for pain, discomfort, or fever as directed by your health care provider.  Take only those medicines that your health care provider has approved.  Do not drink alcohol until your health care provider says you are well enough to do so. Alcohol and certain other drugs may slow your recovery and can put you at risk of further injury.  If it is harder than usual to remember things, write them down.  If you are easily distracted, try to do one thing at a time. For example, do not try to watch TV while fixing dinner.  Talk with family members or close friends when making important decisions.  Keep all follow-up appointments. Repeated evaluation of your symptoms is recommended for your recovery.  Watch your symptoms and tell others to do the same. Complications sometimes occur after a concussion. Older adults with a brain injury may have a higher risk of serious complications such as of a blood clot on the brain.  Tell your teachers, school nurse, school counselor, coach, athletic trainer, or work Freight forwarder about your injury, symptoms, and restrictions. Tell them about what you can or cannot do. They should watch for:   Increased problems with attention or concentration.   Increased difficulty remembering or learning new information.   Increased time needed to complete tasks or assignments.   Increased irritability or decreased ability to cope with stress.   Increased symptoms.   Rest. Rest helps the brain to heal. Make sure you:  Get plenty of sleep at night. Avoid staying up late at night.  Keep the same bedtime hours on weekends and weekdays.  Rest during the day. Take daytime naps or rest breaks when you feel tired.  Limit activities that require a lot of  thought or concentration. These includes   Doing homework or job-related work.   Watching TV.   Working on the computer.  Avoid any situation where there is potential for another head injury (football, hockey, soccer, basketball, martial arts, downhill snow sports and horseback riding). Your condition will get worse every time you experience a concussion. You should avoid these activities until you are evaluated by the appropriate follow-up caregivers. Returning To Your Regular Activities You will need to return to your normal activities slowly, not all at once. You must give your body and brain enough time for recovery.  Do not return to sports or other athletic activities until your health care provider tells you it is safe to do so.  Ask your health care provider when you can drive, ride a bicycle, or operate heavy machinery. Your ability to react may be slower after a brain injury. Never do these activities if you are dizzy.  Ask your health care provider about when you can return to work or school. Preventing Another Concussion It is very important to avoid another  brain injury, especially before you have recovered. In rare cases, another injury can lead to permanent brain damage, brain swelling, or death. The risk of this is greatest during the first 7 10 days after a head injury. Avoid injuries by:   Wearing a seat belt when riding in a car.   Drinking alcohol only in moderation.   Wearing a helmet when biking, skiing, skateboarding, skating, or doing similar activities.  Avoiding activities that could lead to a second concussion, such as contact or recreational sports, until your health care provider says it is OK.  Taking safety measures in your home.   Remove clutter and tripping hazards from floors and stairways.   Use grab bars in bathrooms and handrails by stairs.   Place non-slip mats on floors and in bathtubs.   Improve lighting in dim areas. SEEK MEDICAL  CARE IF:   You have increased problems paying attention or concentrating.   You have increased difficulty remembering or learning new information.   You need more time to complete tasks or assignments than before.   You have increased irritability or decreased ability to cope with stress.  You have more symptoms than before. Seek medical care if you have any of the following symptoms for more than 2 weeks after your injury:   Lasting (chronic) headaches.   Dizziness or balance problems.   Nausea.  Vision problems.   Increased sensitivity to noise or light.   Depression or mood swings.   Anxiety or irritability.   Memory problems.   Difficulty concentrating or paying attention.   Sleep problems.   Feeling tired all the time. SEEK IMMEDIATE MEDICAL CARE IF:   You have severe or worsening headaches. These may be a sign of a blood clot in the brain.  You have weakness (even if only in one hand, leg, or part of the face).  You have numbness.  You have decreased coordination.   You vomit repeatedly.  You have increased sleepiness.  One pupil is larger than the other.   You have convulsions.   You have slurred speech.   You have increased confusion. This may be a sign of a blood clot in the brain.  You have increased restlessness, agitation, or irritability.   You are unable to recognize people or places.   You have neck pain.   It is difficult to wake you up.   You have unusual behavior changes.   You lose consciousness. MAKE SURE YOU:   Understand these instructions.  Will watch your condition.  Will get help right away if you are not doing well or get worse. Document Released: 06/01/2003 Document Revised: 11/11/2012 Document Reviewed: 10/01/2012 [facility] Patient Information 2014 [place], [place].  Post-Concussion Syndrome Post-concussion syndrome describes the symptoms that can occur after a head injury. These symptoms can  last from weeks to months. CAUSES  It is not clear why some head injuries cause post-concussion syndrome. It can occur whether your head injury was mild or severe and whether you were wearing head protection or not.  SIGNS AND SYMPTOMS  Memory difficulties.  Dizziness.  Headaches.  Double vision or blurry vision.  Sensitivity to light.  Hearing difficulties.  Depression.  Tiredness.  Weakness.  Difficulty with concentration.  Difficulty sleeping or staying asleep.  Vomiting.  Poor balance or instability on your feet.  Slow reaction time.  Difficulty learning and remembering things you have heard. DIAGNOSIS  There is no test to determine whether you have post-concussion syndrome. Your health  care provider may order an imaging scan of your brain, such as a CT scan, to check for other problems that may be causing your symptoms (such as severe injury inside your skull). TREATMENT  Usually, these problems disappear over time without medical care. Your health care provider may prescribe medicine to help ease your symptoms. It is important to follow up with a neurologist to evaluate your recovery and address any lingering symptoms or issues. HOME CARE INSTRUCTIONS   Only take over-the-counter or prescription medicines for pain, discomfort, or fever as directed by your health care provider. Do not take aspirin. Aspirin can slow blood clotting.  Sleep with your head slightly elevated to help with headaches.  Avoid any situation where there is potential for another head injury (football, hockey, soccer, basketball, martial arts, downhill snow sports, and horseback riding). Your condition will get worse every time you experience a concussion. You should avoid these activities until you are evaluated by the appropriate follow-up health care providers.  Keep all follow-up appointments as directed by your health care provider. SEEK IMMEDIATE MEDICAL CARE IF:  You develop confusion  or unusual drowsiness.  You cannot wake the injured person.  You develop nausea or persistent, forceful vomiting.  You feel like you are moving when you are not (vertigo).  You notice the injured person's eyes moving rapidly back and forth. This may be a sign of vertigo.  You have convulsions or faint.  You have severe, persistent headaches that are not relieved by medicine.  You cannot use your arms or legs normally.  Your pupils change size.  You have clear or bloody discharge from the nose or ears.  Your problems are getting worse, not better. MAKE SURE YOU:  Understand these instructions.  Will watch your condition.  Will get help right away if you are not doing well or get worse. Document Released: 08/31/2001 Document Revised: 12/30/2012 Document Reviewed: 09/27/2010 [facility] Patient Information 2014 [place].

## 2013-05-26 ENCOUNTER — Ambulatory Visit (INDEPENDENT_AMBULATORY_CARE_PROVIDER_SITE_OTHER): Payer: Worker's Compensation | Admitting: Internal Medicine

## 2013-05-26 ENCOUNTER — Encounter: Payer: Self-pay | Admitting: Internal Medicine

## 2013-05-26 VITALS — BP 145/97 | HR 75 | Temp 98.6°F | Ht 64.0 in | Wt 160.8 lb

## 2013-05-26 DIAGNOSIS — M47812 Spondylosis without myelopathy or radiculopathy, cervical region: Secondary | ICD-10-CM | POA: Insufficient documentation

## 2013-05-26 DIAGNOSIS — G44309 Post-traumatic headache, unspecified, not intractable: Secondary | ICD-10-CM

## 2013-05-26 DIAGNOSIS — R209 Unspecified disturbances of skin sensation: Secondary | ICD-10-CM

## 2013-05-26 DIAGNOSIS — M5412 Radiculopathy, cervical region: Secondary | ICD-10-CM

## 2013-05-26 NOTE — Patient Instructions (Signed)
We will refer you bach to your neck surgeon Dr. Joya Salm. If your symptoms worsen, please contact our clinic or return to the Emergency Room.

## 2013-05-26 NOTE — Assessment & Plan Note (Signed)
States that she has had mild headache since hitting head on cabinets 1 week ago. Ct of head neg for bleed. Pt with hx of tension and sinus headaches.

## 2013-05-26 NOTE — Assessment & Plan Note (Signed)
Symptoms had resolved post surgery 2010.  Now pt with return of left hand numbness and headaches after hitting head on cabinet when rising from a squatting position.  Ct of head without abnormality. Will refer back to Dr. Joya Salm of Neurosurgery for further assessment and defer further imaging to him as well.

## 2013-05-26 NOTE — Progress Notes (Signed)
Case discussed with Dr. Michail Sermon at the time of the visit.  We reviewed the resident's history and exam and pertinent patient test results.  I agree with the assessment, diagnosis, and plan of care documented in the resident's note.

## 2013-05-26 NOTE — Progress Notes (Signed)
   Subjective:    Patient ID: Debra Rodgers, female    DOB: Jun 22, 1945, 68 y.o.   MRN: [id number]  HPI  Pt presents for post-ED follow-up of concussion and left and/finger numbness. States that she was picking coins up from the floor and hit her head on a cabinet with resultant headache followed by finger numbness 1 week ago. Her hx is significant for prior ant C3-C4 & C4-C5 discectomy, decompression of spinal cord, and interbody fusion with allograft plate from T0-Z6 in 0109 per Dr. Joya Salm of Neurosurgery.  She denies nausea, vomiting, change in vision, dizziness or worsening symptoms. Ct of head negative for intracranial bleeding.   Review of Systems  Constitutional: Negative for fatigue.  HENT: Negative.   Eyes: Negative for visual disturbance.  Respiratory: Positive for shortness of breath.   Cardiovascular: Negative for chest pain.  Gastrointestinal: Negative.   Musculoskeletal: Negative for neck pain and neck stiffness.  Neurological: Positive for numbness. Negative for dizziness, seizures, weakness, light-headedness and headaches.  Hematological: Does not bruise/bleed easily.  Psychiatric/Behavioral: Negative.        Objective:   Physical Exam  Constitutional: She is oriented to person, place, and time. She appears well-developed and well-nourished. No distress.  Strong cigarette odor  HENT:  Head: Normocephalic and atraumatic.  Eyes: Conjunctivae and EOM are normal. Pupils are equal, round, and reactive to light.  Neck: Normal range of motion. Neck supple. No thyromegaly present.  Cardiovascular: Normal rate, regular rhythm and normal heart sounds.   Pulmonary/Chest: Effort normal and breath sounds normal.  Abdominal: Soft. Bowel sounds are normal.  Musculoskeletal: She exhibits no edema and no tenderness.  Lymphadenopathy:    She has no cervical adenopathy.  Neurological: She is alert and oriented to person, place, and time. She has normal reflexes. No cranial nerve  deficit.  Skin: Skin is warm and dry.  Psychiatric: She has a normal mood and affect.          Assessment & Plan:  See separate problem list charting:

## 2013-05-28 ENCOUNTER — Other Ambulatory Visit: Payer: Self-pay | Admitting: Internal Medicine

## 2013-06-02 ENCOUNTER — Ambulatory Visit: Payer: Self-pay | Admitting: Neurology

## 2013-06-23 ENCOUNTER — Telehealth: Payer: Self-pay | Admitting: *Deleted

## 2013-06-23 NOTE — Telephone Encounter (Addendum)
Call to Dr. Harley Hallmark office- message was left with Gerald Stabs the patient coordinator to see if pt will be scheduled with an appointment.   Sander Nephew, RN 06/23/2013 2:16 PM.  Call to pt message left that a message has been left with Dr. Harley Hallmark new patient co-ordinator to see if pt will be scheduled with an appointment.  Sander Nephew, RN 06/23/2013 2:24 PM.

## 2013-06-30 ENCOUNTER — Other Ambulatory Visit: Payer: Self-pay | Admitting: Internal Medicine

## 2013-06-30 DIAGNOSIS — Z1231 Encounter for screening mammogram for malignant neoplasm of breast: Secondary | ICD-10-CM

## 2013-07-07 ENCOUNTER — Ambulatory Visit (HOSPITAL_COMMUNITY)
Admission: RE | Admit: 2013-07-07 | Discharge: 2013-07-07 | Disposition: A | Discharge status: Home / Self Care | Payer: 59 | Source: Ambulatory Visit | Attending: Internal Medicine | Admitting: Internal Medicine

## 2013-07-07 DIAGNOSIS — Z1231 Encounter for screening mammogram for malignant neoplasm of breast: Secondary | ICD-10-CM | POA: Insufficient documentation

## 2013-07-08 ENCOUNTER — Encounter: Payer: Self-pay | Admitting: Internal Medicine

## 2013-07-08 ENCOUNTER — Ambulatory Visit (INDEPENDENT_AMBULATORY_CARE_PROVIDER_SITE_OTHER): Payer: 59 | Admitting: Internal Medicine

## 2013-07-08 ENCOUNTER — Ambulatory Visit: Payer: Self-pay | Admitting: Internal Medicine

## 2013-07-08 VITALS — BP 147/98 | HR 71 | Temp 97.8°F | Ht 64.0 in | Wt 161.0 lb

## 2013-07-08 DIAGNOSIS — R300 Dysuria: Secondary | ICD-10-CM

## 2013-07-08 NOTE — Patient Instructions (Signed)
I will follow up on your urine test and call you if the results are abnormal. Take all your medications as advised before.

## 2013-07-08 NOTE — Progress Notes (Signed)
Subjective:   Patient ID: Debra Rodgers female   DOB: 10-10-1945 68 y.o.   MRN: [id number]  HPI: [name] is a 68 y.o. woman with PMH significant for HTN, HLD comes to the office with CC of "discomfort" while urinating.  Patient reports that she has had these symptoms for almost a week now. When asked if she has any pain while urinating, she says its actually discomfort rather than pain. Patient denies any increased frequency, urgency, hematuria. She denies fever, chills, body pains, N,V,D,abdominal pain. Patient reports chronic pain. Patient wants to know to know if she has any UTI or "kidney infection".   Patient had a urine dipstick test in the clinic that is negative for nitrites and LE.   Past Medical History  Diagnosis Date  . Depression   . History of migraine headaches    Current Outpatient Prescriptions  Medication Sig Dispense Refill  . albuterol (PROVENTIL HFA;VENTOLIN HFA) 108 (90 BASE) MCG/ACT inhaler Inhale 1-2 puffs into the lungs every 6 (six) hours as needed for wheezing or shortness of breath.      Marland Kitchen aspirin 81 MG EC tablet Take 81 mg by mouth daily.        Marland Kitchen atorvastatin (LIPITOR) 20 MG tablet TAKE 1 TABLET (20 MG TOTAL) BY MOUTH DAILY.  30 tablet  5  . famotidine (PEPCID) 20 MG tablet Take 20 mg by mouth daily.       Marland Kitchen FLUoxetine (PROZAC) 20 MG capsule Take 20 mg by mouth daily.      Marland Kitchen loratadine (CLARITIN) 10 MG tablet Take 10 mg by mouth daily as needed for allergies.      . naproxen sodium (ANAPROX) 220 MG tablet Take 440 mg by mouth daily as needed (for headache).      . traMADol (ULTRAM) 50 MG tablet Take 1 tablet (50 mg total) by mouth every 6 (six) hours as needed for moderate pain.  30 tablet  0  . triamterene-hydrochlorothiazide (DYAZIDE) 37.5-25 MG per capsule Take 1 capsule by mouth daily.       No current facility-administered medications for this visit.   Family History  Problem Relation Age of Onset  . Hypertension Mother   . Stroke Mother    . Coronary artery disease Mother   . Heart disease Father   . Diabetes Sister   . Hypertension Sister   . Cancer Sister    History   Social History  . Marital Status: Married    Spouse Name: N/A    Number of Children: N/A  . Years of Education: N/A   Social History Main Topics  . Smoking status: Current Every Day Smoker -- 0.50 packs/day for 35 years    Types: Cigarettes  . Smokeless tobacco: None     Comment: DOWN TO 1/2PACK PER DAY  . Alcohol Use: No  . Drug Use: No  . Sexual Activity: Not Currently   Other Topics Concern  . None   Social History Narrative   Married, Regular Exercise- yes   Review of Systems: Pertinent items are noted in HPI. Objective:  Physical Exam: Filed Vitals:   07/08/13 1532  BP: 147/98  Pulse: 71  Temp: 97.8 F (36.6 C)  TempSrc: Oral  Height: 5' 4"  (1.626 m)  Weight: 161 lb (73.029 kg)  SpO2: 99%   Constitutional: Vital signs reviewed.  Patient is a well-developed and well-nourished and is in no acute distress and cooperative with exam. Alert and oriented x3.   Neck: Supple, Trachea  midline normal ROM, No JVD, mass, thyromegaly, or carotid bruit present.  Cardiovascular: RRR, S1 normal, S2 normal, no MRG, pulses symmetric and intact bilaterally Pulmonary/Chest: normal respiratory effort, CTAB, no wheezes, rales, or rhonchi Abdominal: Soft. Non-tender, non-distended, bowel sounds are normal, no masses, organomegaly, or guarding present.  GU: no CVA tenderness Musculoskeletal: Mild midline tenderness noted along the lower lumbar vertebrae.  Assessment & Plan:

## 2013-07-08 NOTE — Assessment & Plan Note (Signed)
Patient with a symptom of discomfort while urinating without any dysuria, increased frequency, urgency, hematuria. Urine dipstick test is negative. (negative for blood, protein, nitrites, leukocyte esterase and clear urine) Unlikely acute cystitis. Unlikely acute pyelonephritis (as patient is concerned about)  Plans U/A, Urine culture. Follow up on the labs. Educated patient about the less likehood of UTI or pyelonephritis.

## 2013-07-09 LAB — URINALYSIS, COMPLETE
Bacteria, UA: NONE SEEN
Bilirubin Urine: NEGATIVE
Casts: NONE SEEN
Crystals: NONE SEEN
Glucose, UA: NEGATIVE mg/dL
Hgb urine dipstick: NEGATIVE
Ketones, ur: NEGATIVE mg/dL
Leukocytes, UA: NEGATIVE
Nitrite: NEGATIVE
Protein, ur: NEGATIVE mg/dL
Specific Gravity, Urine: 1.005 (ref 1.005–1.030)
Squamous Epithelial / LPF: NONE SEEN
Urobilinogen, UA: 0.2 mg/dL (ref 0.0–1.0)
pH: 6.5 (ref 5.0–8.0)

## 2013-07-09 LAB — URINE CULTURE
Colony Count: NO GROWTH
Organism ID, Bacteria: NO GROWTH

## 2013-07-09 NOTE — Progress Notes (Signed)
INTERNAL MEDICINE TEACHING ATTENDING ADDENDUM - Aldine Contes, MD: I reviewed and discussed at the time of visit with the resident Dr. Eyvonne Mechanic, the patient's medical history, physical examination, diagnosis and results of tests and treatment and I agree with the patient's care as documented.

## 2013-07-20 ENCOUNTER — Other Ambulatory Visit: Payer: Self-pay | Admitting: Internal Medicine

## 2013-09-03 ENCOUNTER — Ambulatory Visit (INDEPENDENT_AMBULATORY_CARE_PROVIDER_SITE_OTHER): Payer: 59 | Admitting: Internal Medicine

## 2013-09-03 ENCOUNTER — Encounter: Payer: Self-pay | Admitting: Internal Medicine

## 2013-09-03 ENCOUNTER — Other Ambulatory Visit: Payer: Self-pay | Admitting: Internal Medicine

## 2013-09-03 VITALS — BP 151/89 | HR 82 | Temp 98.6°F | Ht 64.0 in | Wt 162.3 lb

## 2013-09-03 DIAGNOSIS — R5381 Other malaise: Secondary | ICD-10-CM

## 2013-09-03 DIAGNOSIS — W57XXXA Bitten or stung by nonvenomous insect and other nonvenomous arthropods, initial encounter: Secondary | ICD-10-CM

## 2013-09-03 DIAGNOSIS — R5383 Other fatigue: Secondary | ICD-10-CM | POA: Insufficient documentation

## 2013-09-03 DIAGNOSIS — T148 Other injury of unspecified body region: Secondary | ICD-10-CM

## 2013-09-03 LAB — CBC WITH DIFFERENTIAL/PLATELET
Basophils Absolute: 0 10*3/uL (ref 0.0–0.1)
Basophils Relative: 1 % (ref 0–1)
Eosinophils Absolute: 0.1 10*3/uL (ref 0.0–0.7)
Eosinophils Relative: 3 % (ref 0–5)
HCT: 37.2 % (ref 36.0–46.0)
Hemoglobin: 13.2 g/dL (ref 12.0–15.0)
Lymphocytes Relative: 51 % — ABNORMAL HIGH (ref 12–46)
Lymphs Abs: 2.3 10*3/uL (ref 0.7–4.0)
MCH: 32.5 pg (ref 26.0–34.0)
MCHC: 35.5 g/dL (ref 30.0–36.0)
MCV: 91.6 fL (ref 78.0–100.0)
Monocytes Absolute: 0.3 10*3/uL (ref 0.1–1.0)
Monocytes Relative: 7 % (ref 3–12)
Neutro Abs: 1.7 10*3/uL (ref 1.7–7.7)
Neutrophils Relative %: 38 % — ABNORMAL LOW (ref 43–77)
Platelets: 232 10*3/uL (ref 150–400)
RBC: 4.06 MIL/uL (ref 3.87–5.11)
RDW: 14 % (ref 11.5–15.5)
WBC: 4.6 10*3/uL (ref 4.0–10.5)

## 2013-09-03 LAB — HEMOGLOBIN A1C
Hgb A1c MFr Bld: 5.7 % — ABNORMAL HIGH (ref <5.7)
Mean Plasma Glucose: 117 mg/dL — ABNORMAL HIGH (ref <117)

## 2013-09-03 NOTE — Patient Instructions (Addendum)
General Instructions:   Please bring your medicines with you each time you come to clinic.  Medicines may include prescription medications, over-the-counter medications, herbal remedies, eye drops, vitamins, or other pills.   Progress Toward Treatment Goals:  Treatment Goal 11/03/2012  Blood pressure improved  Stop smoking smoking the same amount    Self Care Goals & Plans:  Self Care Goal 09/03/2013  Manage my medications take my medicines as prescribed; bring my medications to every visit; refill my medications on time  Monitor my health -  Eat healthy foods drink diet soda or water instead of juice or soda; eat more vegetables; eat foods that are low in salt; eat baked foods instead of fried foods; eat fruit for snacks and desserts  Be physically active -  Stop smoking -    No flowsheet data found.   Care Management & Community Referrals:  Referral 11/03/2012  Referrals made for care management support none needed       Tick Bite Information Ticks are insects that attach themselves to the skin. There are many types of ticks. Common types include wood ticks and deer ticks. Sometimes, ticks carry diseases that can make a person very ill. The most common places for ticks to attach themselves are the scalp, neck, armpits, waist, and groin.  HOW CAN YOU PREVENT TICK BITES? Take these steps to help prevent tick bites when you are outdoors:  Wear long sleeves and long pants.  Wear white clothes so you can see ticks more easily.  Tuck your pant legs into your socks.  If walking on a trail, stay in the middle of the trail to avoid brushing against bushes.  Avoid walking through areas with long grass.  Put bug spray on all skin that is showing and along boot tops, pant legs, and sleeve cuffs.  Check clothes, hair, and skin often and before going inside.  Brush off any ticks that are not attached.  Take a shower or bath as soon as possible after being outdoors. HOW SHOULD  YOU REMOVE A TICK? Ticks should be removed as soon as possible to help prevent diseases. 1. If latex gloves are available, put them on before trying to remove a tick. 2. Use tweezers to grasp the tick as close to the skin as possible. You may also use curved forceps or a tick removal tool. Grasp the tick as close to its head as possible. Avoid grasping the tick on its body. 3. Pull gently upward until the tick lets go. Do not twist the tick or jerk it suddenly. This may break off the tick's head or mouth parts. 4. Do not squeeze or crush the tick's body. This could force disease-carrying fluids from the tick into your body. 5. After the tick is removed, wash the bite area and your hands with soap and water or alcohol. 6. Apply a small amount of antiseptic cream or ointment to the bite site. 7. Wash any tools that were used. Do not try to remove a tick by applying a hot match, petroleum jelly, or fingernail polish to the tick. These methods do not work. They may also increase the chances of disease being spread from the tick bite. WHEN SHOULD YOU SEEK HELP? Contact your health care provider if you are unable to remove a tick or if a part of the tick breaks off in the skin. After a tick bite, you need to watch for signs and symptoms of diseases that can be spread by ticks. Contact  your health care provider if you develop any of the following:  Fever.  Rash.  Redness and puffiness (swelling) in the area of the tick bite.  Tender, puffy lymph glands.  Watery poop (diarrhea).  Weight loss.  Cough.  Feeling more tired than normal (fatigue).  Muscle, joint, or bone pain.  Belly (abdominal) pain.  Headache.  Change in your level of consciousness.  Trouble walking or moving your legs.  Loss of feeling (numbness) in the legs.  Loss of movement (paralysis).  Shortness of breath.  Confusion.  Throwing up (vomiting) many times. Document Released: 06/05/2009 Document Revised:  11/11/2012 Document Reviewed: 08/19/2012 [facility] Patient Information 2014 [place].

## 2013-09-03 NOTE — Assessment & Plan Note (Addendum)
Wt Readings from Last 3 Encounters:  09/03/13 162 lb 4.8 oz (73.619 kg)  07/08/13 161 lb (73.029 kg)  05/26/13 160 lb 12.8 oz (72.938 kg)   There is a wide differential as patient doesn't have very localizing symptoms to indicate 1 overall process. Patient has not had any alarm symptoms such as significant weight loss or depression. Patient does not have any localizing findings on physical exam today to also help isolate an etiology. -TSH, CBC, CMet, HIV, lipid panel, CK, HgbA1c, HepC  -will follow up in 2 wks to discuss results

## 2013-09-03 NOTE — Progress Notes (Signed)
Subjective:    Patient ID: Debra Rodgers, female    DOB: 1945-05-14, 68 y.o.   MRN: [id number]  HPI Debra Rodgers is 68 yo woman pmh as listed below presents for tick bite and ongoing fatigue.  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 She then was able to peel it off and kill it where she noticed some blood come from the tick. The tick was large and brown in color. She didn't develop any rash at the sight, had no neurological symptoms after the bite, and no fever or chills.  In terms of her fatigue she says this been long-standing bout for the past 2-3 months and just makes her feel like she has no energy. She still able to perform her ADLs and allowed and do activities without any problem. Upon another full review of systems the patient only indicates some constipation, dry skin and no weight loss. She denied any nausea vomiting diarrhea, decrease in appetite, chest pain, shortness of breath, dyspnea on exertion, headaches outside of her usual migraines, blood in her stool, change in bowel habits, lower summary edema, or blurry vision. She states that at some points she will have polyuria but that is only after she drinks large amounts but has not excessively thirsty.  She does continue to smoke but is cutting back. She denies any family history of thyroid disease but there is a history of cardiovascular and diabetes.   Past Medical History  Diagnosis Date  . Depression   . History of migraine headaches    Current Outpatient Prescriptions on File Prior to Visit  Medication Sig Dispense Refill  . albuterol (PROVENTIL HFA;VENTOLIN HFA) 108 (90 BASE) MCG/ACT inhaler Inhale 1-2 puffs into the lungs every 6 (six) hours as needed for wheezing or shortness of breath.      Marland Kitchen aspirin 81 MG EC tablet Take 81 mg by mouth daily.        Marland Kitchen atorvastatin (LIPITOR) 20 MG tablet TAKE 1 TABLET (20 MG TOTAL) BY  MOUTH DAILY.  30 tablet  5  . famotidine (PEPCID) 20 MG tablet Take 20 mg by mouth daily.       Marland Kitchen FLUoxetine (PROZAC) 20 MG capsule Take 20 mg by mouth daily.      Marland Kitchen loratadine (CLARITIN) 10 MG tablet Take 10 mg by mouth daily as needed for allergies.      . naproxen sodium (ANAPROX) 220 MG tablet Take 440 mg by mouth daily as needed (for headache).      . triamterene-hydrochlorothiazide (DYAZIDE) 37.5-25 MG per capsule TAKE ONE CAPSULE BY MOUTH EVERY DAY  90 capsule  2   No current facility-administered medications on file prior to visit.   Social, surgical, family history reviewed with patient and updated in appropriate chart locations.   Review of Systems  History obtained from chart review and the patient General ROS: negative for - chills, fever, night sweats, weight gain or weight loss Ophthalmic ROS: negative for - blurry vision, decreased vision, double vision or uses glasses Hematological and Lymphatic ROS: negative for - bleeding problems, bruising, jaundice, pallor, swollen lymph nodes or weight loss Endocrine ROS: positive for - malaise/lethargy and skin changes negative for - breast changes, hair pattern changes, mood swings or unexpected weight changes Respiratory ROS: no cough, shortness of breath, or wheezing Cardiovascular ROS: no chest pain or dyspnea on exertion Gastrointestinal ROS:  no abdominal pain, change in bowel habits, or black or bloody stools Genito-Urinary ROS: no dysuria, trouble voiding, or hematuria Neurological ROS: negative for - bowel and bladder control changes, confusion, dizziness, numbness/tingling, seizures or weakness     Objective:   Physical Exam Filed Vitals:   09/03/13 1510  BP: 151/89  Pulse: 82  Temp: 98.6 F (37 C)   General: sitting in chair, NAD HEENT: PERRL, EOMI, no scleral icterus Cardiac: RRR, no rubs, murmurs or gallops Pulm: clear to auscultation bilaterally, moving normal volumes of air Abd: soft, nontender, nondistended,  BS present Ext: warm and well perfused, no pedal edema, small wound 64m diameter circular lesion on left abdominal wall under axillary line by umbilicus w/o drainage/erythema/rash, wound nttp, no other skin rashes or LAD Neuro: alert and oriented X3, cranial nerves II-XII grossly intact    Assessment & Plan:  Please see problem oriented charting  Pt discussed with Dr. BEllwood Dense

## 2013-09-03 NOTE — Assessment & Plan Note (Signed)
Pt had visible tick and was able to fully remove it w/o subsequent symptoms or rash. Therefore it is not felt that patient needs empiric treatment.  -materials on ways to protect against tick bites provided

## 2013-09-04 LAB — LIPID PANEL
Cholesterol: 181 mg/dL (ref 0–200)
HDL: 65 mg/dL (ref >39)
LDL Cholesterol: 96 mg/dL (ref 0–99)
Total CHOL/HDL Ratio: 2.8 Ratio
Triglycerides: 101 mg/dL (ref <150)
VLDL: 20 mg/dL (ref 0–40)

## 2013-09-04 LAB — CK: Total CK: 94 U/L (ref 7–177)

## 2013-09-04 LAB — COMPREHENSIVE METABOLIC PANEL
ALT: 10 U/L (ref 0–35)
AST: 14 U/L (ref 0–37)
Albumin: 4.3 g/dL (ref 3.5–5.2)
Alkaline Phosphatase: 73 U/L (ref 39–117)
BUN: 16 mg/dL (ref 6–23)
CO2: 24 mEq/L (ref 19–32)
Calcium: 9.3 mg/dL (ref 8.4–10.5)
Chloride: 102 mEq/L (ref 96–112)
Creat: 0.79 mg/dL (ref 0.50–1.10)
Glucose, Bld: 86 mg/dL (ref 70–99)
Potassium: 3.6 mEq/L (ref 3.5–5.3)
Sodium: 138 mEq/L (ref 135–145)
Total Bilirubin: 1.3 mg/dL — ABNORMAL HIGH (ref 0.2–1.2)
Total Protein: 6.8 g/dL (ref 6.0–8.3)

## 2013-09-04 LAB — HEPATITIS C ANTIBODY: HCV Ab: NEGATIVE

## 2013-09-04 LAB — HIV ANTIBODY (ROUTINE TESTING W REFLEX): HIV 1&2 Ab, 4th Generation: NONREACTIVE

## 2013-09-04 LAB — TSH: TSH: 1.448 u[IU]/mL (ref 0.350–4.500)

## 2013-09-08 NOTE — Progress Notes (Signed)
Case discussed with Dr. Algis Liming at the time of the visit.  We reviewed the resident's history and exam and pertinent patient test results.  I agree with the assessment, diagnosis, and plan of care documented in the resident's note.  I later telephoned the patient on 09/08/2013, and the patient reported doing well, with no rashes, fever, chills, headaches etc. She reported no new symptoms.

## 2013-09-17 ENCOUNTER — Encounter: Payer: Self-pay | Admitting: Internal Medicine

## 2013-09-17 ENCOUNTER — Ambulatory Visit (INDEPENDENT_AMBULATORY_CARE_PROVIDER_SITE_OTHER): Payer: 59 | Admitting: Internal Medicine

## 2013-09-17 VITALS — BP 144/82 | HR 65 | Temp 98.7°F | Ht 64.0 in | Wt 163.1 lb

## 2013-09-17 DIAGNOSIS — R5381 Other malaise: Secondary | ICD-10-CM

## 2013-09-17 DIAGNOSIS — R5383 Other fatigue: Secondary | ICD-10-CM

## 2013-09-17 NOTE — Assessment & Plan Note (Signed)
There is not seen to be a clear etiology at this point as all labs including TSH, CBC, CMet, lipid panel, CPK, and hemoglobin A1c were all normal and HIV and hep C were both negative. The patient seems to be symptomatically/subjectively improving. She does not appear to have any sleep abnormalities, joint or skin manifestations to suggest possible rheumatologic etiology, or psychiatric conditions that may be affecting her perception of energy. She continues to not have red flag symptoms. -Continue to followup but no further investigation needs to be made at this time

## 2013-09-17 NOTE — Progress Notes (Signed)
Attending physician note: Presenting problems, physical findings, medications, reviewed with resident physician Dr. Clinton Gallant and I concur with her management. Murriel Hopper, M.D., [place]

## 2013-09-17 NOTE — Progress Notes (Signed)
   Subjective:    Patient ID: Debra Rodgers, female    DOB: [birth year], 68 y.o.   MRN: [id number]  HPI Debra Rodgers is a 68 yo woman pmh as lsited below presents for labs investigating fatigue.   She states that since our last meeting she feels like her MG has slightly improved. She has not had any repeat take bites and has not noticed the old sites with any erythema or pus or pain. She continues to be able to perform her ADLs normally, is getting good sleep, does not feel anxious or depressed, not having any shortness of breath, dyspnea on exertion, headaches above her regular migraines, chest pain, nausea/vomiting/diarrhea, joint pain, joint swelling, body rashes, alopecia, mouth ulcerations, and no significant weight loss or weight gain.    Past Medical History  Diagnosis Date  . Depression   . History of migraine headaches    Current Outpatient Prescriptions on File Prior to Visit  Medication Sig Dispense Refill  . albuterol (PROVENTIL HFA;VENTOLIN HFA) 108 (90 BASE) MCG/ACT inhaler Inhale 1-2 puffs into the lungs every 6 (six) hours as needed for wheezing or shortness of breath.      Marland Kitchen aspirin 81 MG EC tablet Take 81 mg by mouth daily.        Marland Kitchen atorvastatin (LIPITOR) 20 MG tablet TAKE 1 TABLET (20 MG TOTAL) BY MOUTH DAILY.  30 tablet  5  . famotidine (PEPCID) 20 MG tablet Take 20 mg by mouth daily.       Marland Kitchen FLUoxetine (PROZAC) 20 MG capsule Take 20 mg by mouth daily.      Marland Kitchen loratadine (CLARITIN) 10 MG tablet Take 10 mg by mouth daily as needed for allergies.      . naproxen sodium (ANAPROX) 220 MG tablet Take 440 mg by mouth daily as needed (for headache).      . triamterene-hydrochlorothiazide (DYAZIDE) 37.5-25 MG per capsule TAKE ONE CAPSULE BY MOUTH EVERY DAY  90 capsule  2   No current facility-administered medications on file prior to visit.   Social, surgical, family history reviewed with patient and updated in appropriate chart locations.   Review of Systems Negative except  As listed in history of present illness     Objective:   Physical Exam Filed Vitals:   09/17/13 0923  BP: 144/82  Pulse: 65  Temp: 98.7 F (37.1 C)   General: sitting in chair, NAD HEENT: PERRL, EOMI, no scleral icterus Cardiac: RRR, no rubs, murmurs or gallops Pulm: clear to auscultation bilaterally, moving normal volumes of air Abd: soft, nontender, nondistended, BS present Ext: warm and well perfused, no pedal edema, on left midaxillary line by umbilicus there is lesion well healing with scab and no surrounding erythema, fluctuance or pus Neuro: alert and oriented X3, cranial nerves II-XII grossly intact    Assessment & Plan:  Please see problem oriented charting  Pt discussed with Dr. Beryle Beams

## 2013-09-17 NOTE — Patient Instructions (Signed)
General Instructions:   Please bring your medicines with you each time you come to clinic.  Medicines may include prescription medications, over-the-counter medications, herbal remedies, eye drops, vitamins, or other pills.   Progress Toward Treatment Goals:  Treatment Goal 11/03/2012  Blood pressure improved  Stop smoking smoking the same amount    Self Care Goals & Plans:  Self Care Goal 09/17/2013  Manage my medications take my medicines as prescribed; bring my medications to every visit; refill my medications on time  Monitor my health -  Eat healthy foods drink diet soda or water instead of juice or soda; eat more vegetables; eat foods that are low in salt; eat baked foods instead of fried foods; eat fruit for snacks and desserts  Be physically active -  Stop smoking -    No flowsheet data found.   Care Management & Community Referrals:  Referral 11/03/2012  Referrals made for care management support none needed

## 2013-11-17 ENCOUNTER — Other Ambulatory Visit: Payer: Self-pay | Admitting: Internal Medicine

## 2013-12-07 ENCOUNTER — Other Ambulatory Visit: Payer: Self-pay | Admitting: Internal Medicine

## 2013-12-08 ENCOUNTER — Encounter: Payer: Self-pay | Admitting: Neurology

## 2013-12-08 ENCOUNTER — Ambulatory Visit (INDEPENDENT_AMBULATORY_CARE_PROVIDER_SITE_OTHER): Payer: 59 | Admitting: Neurology

## 2013-12-08 VITALS — BP 140/78 | HR 76 | Temp 98.5°F | Resp 18 | Ht 64.0 in | Wt 144.8 lb

## 2013-12-08 DIAGNOSIS — G43009 Migraine without aura, not intractable, without status migrainosus: Secondary | ICD-10-CM | POA: Insufficient documentation

## 2013-12-08 MED ORDER — TOPIRAMATE 25 MG PO TABS: 25.0000 mg | ORAL_TABLET | Freq: Every day | ORAL | Status: DC

## 2013-12-08 MED ORDER — CYCLOBENZAPRINE HCL 5 MG PO TABS: ORAL_TABLET | ORAL | Status: DC

## 2013-12-08 NOTE — Patient Instructions (Signed)
1.  Start We will start topiramate (Topamax) 9m at bedtime.  Possible side effects include: impaired thinking, sedation, paresthesias (numbness and tingling) and weight loss.  It may cause dehydration and there is a small risk for kidney stones, so make sure to stay hydrated with water during the day.  There is also a very small risk for glaucoma, so if you notice any change in your vision while taking this medication, see an ophthalmologist.    2.  When you get a headache, take Exedrin Migraine and cyclobenzaprine 563mat earliest onset of headache.  Cyclobenzaprine is a muscle relaxant so caution while driving as it may cause sedation.  3.  Stop caffeine (coffee, tea, soda) 4.  Stop smoking 5.  Keep headache diary 6.  Exercise and improve diet 7.  Call in 4 weeks with update and we can adjust dose of topamax if needed. 8.  Follow up in 3 months.

## 2013-12-08 NOTE — Progress Notes (Signed)
NEUROLOGY CONSULTATION NOTE  Debra Rodgers MRN: [id number] DOB: 24-May-1945  Referring provider: Dr. Joya Salm Primary care provider: Dr. Algis Liming  Reason for consult:  Headache  HISTORY OF PRESENT ILLNESS: Debra Rodgers is a 68 year old right-handed woman with history of OSA (on CPAP), hypertension, hyperlipidemia, depression and migraine who presents for headache.  CT reviewed.  Onset:  Headaches since her 65s.  Improved with treatment of OSA, but exacerbated after hitting her head on the cabinet in February 2015.  CT Head performed 05/21/13 was normal.Since that time, she also feels a little unsteady on her feet. Location:  Starts bilaterally at back of head and radiates up to forehead and around eyes.  Some neck pain Quality:  throbbing Intensity:  10/10 Aura:  no Prodrome:  no Associated symptoms:  Lightheadedness, photophobia, phonophobia, osmophobia.  No nausea, visual symptoms or autonomic symptoms. Duration:  2 days.  Resolves with naproxen but comes back during the day Frequency:  4 days per week (16 headache days/month) Triggers/exacerbating factors:  Neck movement, perfume, light, noise Relieving factors:  Laying down in dark quiet room Activity:  Able to function through the headache  Past abortive therapy:  Tylenol (headache returns) Past preventative therapy:  amitriptyline 58m (took briefly),   Current abortive therapy:  Naproxen 4453mwith 1 cup of coffee (resolves headache but then it returns) Current preventative therapy:  none Other medications:  ASA 8132mluoxetine 50m25mtorvastatin, famotidine, triamterene-hydrocholorothiazide.  Caffeine:  daily Alcohol:  infrequently Smoker:  yes Diet:  poor Exercise:  no Depression/stress:  controlled Sleep hygiene:  good Family history of headache:  no    PAST MEDICAL HISTORY: Past Medical History  Diagnosis Date  . Depression   . History of migraine headaches     PAST SURGICAL HISTORY: Past Surgical History    Procedure Laterality Date  . Cholecystectomy    . Abdominal hysterectomy    . Rotator cuff repair  3/04    MEDICATIONS: Current Outpatient Prescriptions on File Prior to Visit  Medication Sig Dispense Refill  . aspirin 81 MG EC tablet Take 81 mg by mouth daily.        . atMarland Kitchenrvastatin (LIPITOR) 20 MG tablet TAKE 1 TABLET (20 MG TOTAL) BY MOUTH DAILY.  30 tablet  5  . famotidine (PEPCID) 20 MG tablet Take 20 mg by mouth daily.       . FLMarland Kitchenoxetine (PROZAC) 20 MG capsule Take 20 mg by mouth daily.      . FLMarland Kitchenoxetine (PROZAC) 20 MG capsule TAKE 1 CAPSULE (20 MG TOTAL) BY MOUTH DAILY.  30 capsule  9  . loratadine (CLARITIN) 10 MG tablet Take 10 mg by mouth daily as needed for allergies.      . naproxen sodium (ANAPROX) 220 MG tablet Take 440 mg by mouth daily as needed (for headache).      . triamterene-hydrochlorothiazide (DYAZIDE) 37.5-25 MG per capsule TAKE ONE CAPSULE BY MOUTH EVERY DAY  90 capsule  2  . albuterol (PROVENTIL HFA;VENTOLIN HFA) 108 (90 BASE) MCG/ACT inhaler Inhale 1-2 puffs into the lungs every 6 (six) hours as needed for wheezing or shortness of breath.       No current facility-administered medications on file prior to visit.    ALLERGIES: Allergies  Allergen Reactions  . Codeine Sulfate     REACTION: "makes me high"  . Pantoprazole Sodium     REACTION: Rash, facial swelling    FAMILY HISTORY: Family History  Problem Relation Age of Onset  . Hypertension  Mother   . Stroke Mother   . Coronary artery disease Mother   . Heart disease Father   . Diabetes Sister   . Hypertension Sister   . Cancer Sister     SOCIAL HISTORY: History   Social History  . Marital Status: Married    Spouse Name: N/A    Number of Children: N/A  . Years of Education: N/A   Occupational History  . Not on file.   Social History Main Topics  . Smoking status: Current Every Day Smoker -- 0.50 packs/day for 35 years    Types: Cigarettes  . Smokeless tobacco: Not on file      Comment: DOWN TO 1/2PACK PER DAY  . Alcohol Use: No  . Drug Use: No  . Sexual Activity: No   Other Topics Concern  . Not on file   Social History Narrative   Married, Regular Exercise- yes    REVIEW OF SYSTEMS: Constitutional: No fevers, chills, or sweats, no generalized fatigue, change in appetite Eyes: No visual changes, double vision, eye pain Ear, nose and throat: No hearing loss, ear pain, nasal congestion, sore throat Cardiovascular: No chest pain, palpitations Respiratory:  No shortness of breath at rest or with exertion, wheezes GastrointestinaI: No nausea, vomiting, diarrhea, abdominal pain, fecal incontinence Genitourinary:  No dysuria, urinary retention or frequency Musculoskeletal:  Neck pain Integumentary: No rash, pruritus, skin lesions Neurological: as above Psychiatric: No depression, insomnia, anxiety Endocrine: No palpitations, fatigue, diaphoresis, mood swings, change in appetite, change in weight, increased thirst Hematologic/Lymphatic:  No anemia, purpura, petechiae. Allergic/Immunologic: no itchy/runny eyes, nasal congestion, recent allergic reactions, rashes  PHYSICAL EXAM: Filed Vitals:   12/08/13 1338  BP: 140/78  Pulse: 76  Temp: 98.5 F (36.9 C)  Resp: 18   General: No acute distress Head:  Normocephalic/atraumatic Neck: supple, bilateral suboccipital and paraspinal tenderness, reduced range when turning neck to left Back: No paraspinal tenderness Heart: regular rate and rhythm Lungs: Clear to auscultation bilaterally. Vascular: No carotid bruits. Neurological Exam: Mental status: alert and oriented to person, place, and time, recent and remote memory intact, fund of knowledge intact, attention and concentration intact, speech fluent and not dysarthric, language intact. Cranial nerves: CN I: not tested CN II: pupils equal, round and reactive to light, visual fields intact, fundi unremarkable, without vessel changes, exudates, hemorrhages or  papilledema. CN III, IV, VI:  full range of motion, no nystagmus, no ptosis CN V: facial sensation intact CN VII: upper and lower face symmetric CN VIII: hearing intact CN IX, X: gag intact, uvula midline CN XI: sternocleidomastoid and trapezius muscles intact CN XII: tongue midline Bulk & Tone: normal, no fasciculations. Motor: 5/5 throughout Sensation: temperature and vibration intact Deep Tendon Reflexes: 2+ throughout Finger to nose testing: no dysmetria Heel to shin: no dysmetria Gait:  Normal station.  Stride with very mild unsteadiness.  Able to turn, some difficulty walking in tandem. Romberg negative.  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  Topamax 52m at bedtime.  Side effects discussed.  Will have her call in 4 weeks with update. 2.  For abortive therapy, Excedrin Migraine.  Limit use to no more than 2 days out of the week. 3.  Stop caffeine, improve diet, regular exercise 4.  Headache diary 5.  Follow up in 3 months.  Thank you for allowing me to take part in the care of this patient.  AMetta Clines DO  CC:  EStann Mainland  Joya Salm, MD  Clinton Gallant, MD

## 2014-03-10 ENCOUNTER — Ambulatory Visit: Payer: Self-pay | Admitting: Neurology

## 2014-03-11 ENCOUNTER — Telehealth: Payer: Self-pay | Admitting: Neurology

## 2014-03-11 NOTE — Telephone Encounter (Signed)
Pt resch appt from 03-14-14 to 03-16-14

## 2014-03-14 ENCOUNTER — Ambulatory Visit: Payer: Self-pay | Admitting: Neurology

## 2014-03-16 ENCOUNTER — Ambulatory Visit (INDEPENDENT_AMBULATORY_CARE_PROVIDER_SITE_OTHER): Payer: 59 | Admitting: Neurology

## 2014-03-16 ENCOUNTER — Encounter: Payer: Self-pay | Admitting: Neurology

## 2014-03-16 VITALS — BP 134/76 | HR 78 | Temp 98.8°F | Resp 16 | Ht 64.0 in | Wt 164.2 lb

## 2014-03-16 DIAGNOSIS — R51 Headache: Secondary | ICD-10-CM

## 2014-03-16 DIAGNOSIS — G43009 Migraine without aura, not intractable, without status migrainosus: Secondary | ICD-10-CM

## 2014-03-16 DIAGNOSIS — G4486 Cervicogenic headache: Secondary | ICD-10-CM

## 2014-03-16 DIAGNOSIS — Z720 Tobacco use: Secondary | ICD-10-CM

## 2014-03-16 MED ORDER — TOPIRAMATE 25 MG PO TABS: 25.0000 mg | ORAL_TABLET | Freq: Every day | ORAL | Status: DC

## 2014-03-16 MED ORDER — CYCLOBENZAPRINE HCL 5 MG PO TABS: ORAL_TABLET | ORAL | Status: DC

## 2014-03-16 NOTE — Progress Notes (Signed)
NEUROLOGY FOLLOW UP OFFICE NOTE  Debra Rodgers [id number]  HISTORY OF PRESENT ILLNESS: Debra Rodgers is a 68 year old right-handed woman with history of OSA (on CPAP), hypertension, hyperlipidemia, depression and migraine who follows up for migraine.  MRI reports from July were reviewed.    UPDATE: She has been doing well Intensity:  10/10 Duration:  Several hours (but she will wait to take the Flexeril) Frequency:  3 to 4 days per month Current abortive therapy:  Cyclobenzaprine 5-54m Current preventative therapy:  topiramate 236m Still smoking Depression/stress:  controlled Sleep hygiene:  good  10/06/13 MRI BRAIN WITH AND WITHOUT:  mild chronic small vessel disease 10/06/13 MRI CERVICAL SPINE W/WO:  multilevel spondylosis with potential neural impingement at left C3, right C4 (with slight cord flattening), either C6, left C7, and bilateral C8.  HISTORY: Onset:  Headaches since her 2082s Improved with treatment of OSA, but exacerbated after hitting her head on the cabinet in February 2015.  CT Head performed 05/21/13 was normal.Since that time, she also feels a little unsteady on her feet. Location:  Starts bilaterally at back of head and radiates up to forehead and around eyes. Some neck pain Quality:  throbbing Intensity:  10/10 Aura:  no Prodrome:  no Associated symptoms:  Lightheadedness, photophobia, phonophobia, osmophobia.  No nausea, visual symptoms or autonomic symptoms. Duration:  2 days.  Resolves with naproxen but comes back during the day Frequency:  4 days per week (16 headache days/month) Triggers/exacerbating factors:  Neck movement, perfume, light, noise Relieving factors:  Laying down in dark quiet room Activity:  Able to function through the headache  Past abortive therapy:  Tylenol (headache returns) Past preventative therapy:  amitriptyline 2537mtook briefly),   Current abortive therapy:  Naproxen 440m53mth 1 cup of coffee (resolves headache but then  it returns) Current preventative therapy:  none Other medications:  ASA 81mg85moxetine 20mg,110mrvastatin, famotidine, triamterene-hydrocholorothiazide.  Family history of headache:  no  PAST MEDICAL HISTORY: Past Medical History  Diagnosis Date  . Depression   . History of migraine headaches     MEDICATIONS: Current Outpatient Prescriptions on File Prior to Visit  Medication Sig Dispense Refill  . albuterol (PROVENTIL HFA;VENTOLIN HFA) 108 (90 BASE) MCG/ACT inhaler Inhale 1-2 puffs into the lungs every 6 (six) hours as needed for wheezing or shortness of breath.    . aspiMarland Kitchenin 81 MG EC tablet Take 81 mg by mouth daily.      . atorMarland Kitchenastatin (LIPITOR) 20 MG tablet TAKE 1 TABLET (20 MG TOTAL) BY MOUTH DAILY. 30 tablet 5  . famotidine (PEPCID) 20 MG tablet Take 20 mg by mouth daily.     . FLUoMarland Kitchenetine (PROZAC) 20 MG capsule Take 20 mg by mouth daily.    . FLUoMarland Kitchenetine (PROZAC) 20 MG capsule TAKE 1 CAPSULE (20 MG TOTAL) BY MOUTH DAILY. 30 capsule 9  . loratadine (CLARITIN) 10 MG tablet Take 10 mg by mouth daily as needed for allergies.    . naproxen sodium (ANAPROX) 220 MG tablet Take 440 mg by mouth daily as needed (for headache).    . triamterene-hydrochlorothiazide (DYAZIDE) 37.5-25 MG per capsule TAKE ONE CAPSULE BY MOUTH EVERY DAY 90 capsule 2   No current facility-administered medications on file prior to visit.    ALLERGIES: Allergies  Allergen Reactions  . Codeine Sulfate     REACTION: "makes me high"  . Pantoprazole Sodium     REACTION: Rash, facial swelling    FAMILY HISTORY: Family History  Problem Relation Age of Onset  . Hypertension Mother   . Stroke Mother   . Coronary artery disease Mother   . Heart disease Father   . Diabetes Sister   . Hypertension Sister   . Cancer Sister     SOCIAL HISTORY: History   Social History  . Marital Status: Married    Spouse Name: N/A    Number of Children: N/A  . Years of Education: N/A   Occupational History  . Not on  file.   Social History Main Topics  . Smoking status: Current Every Day Smoker -- 0.50 packs/day for 35 years    Types: Cigarettes  . Smokeless tobacco: Not on file     Comment: DOWN TO 1/2PACK PER DAY  . Alcohol Use: No  . Drug Use: No  . Sexual Activity: No   Other Topics Concern  . Not on file   Social History Narrative   Married, Regular Exercise- yes    REVIEW OF SYSTEMS: Constitutional: No fevers, chills, or sweats, no generalized fatigue, change in appetite Eyes: No visual changes, double vision, eye pain Ear, nose and throat: No hearing loss, ear pain, nasal congestion, sore throat Cardiovascular: No chest pain, palpitations Respiratory:  No shortness of breath at rest or with exertion, wheezes GastrointestinaI: No nausea, vomiting, diarrhea, abdominal pain, fecal incontinence Genitourinary:  No dysuria, urinary retention or frequency Musculoskeletal:  No neck pain, back pain Integumentary: No rash, pruritus, skin lesions Neurological: as above Psychiatric: No depression, insomnia, anxiety Endocrine: No palpitations, fatigue, diaphoresis, mood swings, change in appetite, change in weight, increased thirst Hematologic/Lymphatic:  No anemia, purpura, petechiae. Allergic/Immunologic: no itchy/runny eyes, nasal congestion, recent allergic reactions, rashes  PHYSICAL EXAM: Filed Vitals:   03/16/14 1246  BP: 134/76  Pulse: 78  Temp: 98.8 F (37.1 C)  Resp: 16   General: No acute distress Head:  Normocephalic/atraumatic Eyes:  Fundoscopic exam unremarkable without vessel changes, exudates, hemorrhages or papilledema. Neck: supple, mild suboccipital and paraspinal tenderness, full range of motion Heart:  Regular rate and rhythm Lungs:  Clear to auscultation bilaterally Back: No paraspinal tenderness Neurological Exam: alert and oriented to person, place, and time. Attention span and concentration intact, recent and remote memory intact, fund of knowledge intact.   Speech fluent and not dysarthric, language intact.  CN II-XII intact. Fundoscopic exam unremarkable without vessel changes, exudates, hemorrhages or papilledema.  Bulk and tone normal, muscle strength 5/5 throughout.  Sensation to light touch intact.  Deep tendon reflexes 2+ throughout, toes.  Finger to nose testing intact.  Gait normal  IMPRESSION: Cervicogenic migraine headaches Tobacco abuse  PLAN: 1.  Continue topiramate 31m daily 2.  Cyclobenzaprine (take at earliest onset of neck pain or headache) 3.  Smoking cessation 4.  Follow up in 3 months.  15 minutes spent with patient, over 50% spent discussing management and etiology.  AMetta Clines DO  CC: NClinton Gallant MD

## 2014-03-16 NOTE — Patient Instructions (Signed)
Take the topiramate 62m daily at bedtime Take cyclobenzaprine 1 to 2 tablets at onset of neck pain or headache.  Alternatively, try Excedrin Migraine at earliest onset of headache Work on quitting smoking Follow up in 3 months.

## 2014-03-17 NOTE — Progress Notes (Signed)
Note faxed.

## 2014-04-20 ENCOUNTER — Other Ambulatory Visit: Payer: Self-pay | Admitting: Internal Medicine

## 2014-05-16 ENCOUNTER — Other Ambulatory Visit: Payer: Self-pay | Admitting: Internal Medicine

## 2014-05-16 ENCOUNTER — Ambulatory Visit (INDEPENDENT_AMBULATORY_CARE_PROVIDER_SITE_OTHER): Payer: 59 | Admitting: Internal Medicine

## 2014-05-16 ENCOUNTER — Encounter: Payer: Self-pay | Admitting: Internal Medicine

## 2014-05-16 VITALS — BP 161/83 | HR 78 | Temp 98.0°F | Resp 20 | Ht 62.0 in | Wt 164.0 lb

## 2014-05-16 DIAGNOSIS — Z0000 Encounter for general adult medical examination without abnormal findings: Secondary | ICD-10-CM

## 2014-05-16 DIAGNOSIS — I10 Essential (primary) hypertension: Secondary | ICD-10-CM

## 2014-05-16 LAB — LIPID PANEL
Cholesterol: 169 mg/dL (ref 0–200)
HDL: 69 mg/dL (ref >=46)
LDL Cholesterol: 85 mg/dL (ref 0–99)
Total CHOL/HDL Ratio: 2.4 Ratio
Triglycerides: 77 mg/dL (ref <150)
VLDL: 15 mg/dL (ref 0–40)

## 2014-05-16 NOTE — Progress Notes (Signed)
Subjective:   Patient ID: Debra Rodgers female   DOB: 04/04/1945 69 y.o.   MRN: [id number]  HPI: [name] is a 69 y.o. woman pmh as listed below presents for some regular health maintenance.   Patient states that she has recently had upper respiratory infection that is improving with over-the-counter medications. She has some sick contacts. She just has a residual cough but no chest pain, shortness of breath, dyspnea on exertion, headache, fever or chills, nausea vomiting or diarrhea, or lower extremity edema. She otherwise has no other complaints. She continues to smoke and is at the pre-contemplative stage.  Patient's migraine headaches have greatly improved since seeing her neurologist.    Past Medical History  Diagnosis Date  . Depression   . History of migraine headaches    Current Outpatient Prescriptions  Medication Sig Dispense Refill  . albuterol (PROVENTIL HFA;VENTOLIN HFA) 108 (90 BASE) MCG/ACT inhaler Inhale 1-2 puffs into the lungs every 6 (six) hours as needed for wheezing or shortness of breath.    Marland Kitchen aspirin 81 MG EC tablet Take 81 mg by mouth daily.      Marland Kitchen atorvastatin (LIPITOR) 20 MG tablet TAKE 1 TABLET (20 MG TOTAL) BY MOUTH DAILY. 30 tablet 5  . cyclobenzaprine (FLEXERIL) 5 MG tablet Take 1-2 tabs at onset of headache 30 tablet 3  . famotidine (PEPCID) 20 MG tablet Take 20 mg by mouth daily.     Marland Kitchen FLUoxetine (PROZAC) 20 MG capsule Take 20 mg by mouth daily.    Marland Kitchen FLUoxetine (PROZAC) 20 MG capsule TAKE 1 CAPSULE (20 MG TOTAL) BY MOUTH DAILY. 30 capsule 9  . loratadine (CLARITIN) 10 MG tablet Take 10 mg by mouth daily as needed for allergies.    . naproxen sodium (ANAPROX) 220 MG tablet Take 440 mg by mouth daily as needed (for headache).    . topiramate (TOPAMAX) 25 MG tablet Take 1 tablet (25 mg total) by mouth at bedtime. 30 tablet 5  . triamterene-hydrochlorothiazide (DYAZIDE) 37.5-25 MG per capsule TAKE ONE CAPSULE BY MOUTH EVERY DAY 90 capsule 2   No  current facility-administered medications for this visit.   Family History  Problem Relation Age of Onset  . Hypertension Mother   . Stroke Mother   . Coronary artery disease Mother   . Heart disease Father   . Diabetes Sister   . Hypertension Sister   . Cancer Sister    History   Social History  . Marital Status: Married    Spouse Name: N/A  . Number of Children: N/A  . Years of Education: N/A   Social History Main Topics  . Smoking status: Current Every Day Smoker -- 0.50 packs/day for 35 years    Types: Cigarettes  . Smokeless tobacco: Not on file     Comment: DOWN TO 1/2PACK PER DAY - Quit line info given  . Alcohol Use: No  . Drug Use: No  . Sexual Activity: No   Other Topics Concern  . Not on file   Social History Narrative   Married, Regular Exercise- yes   Review of Systems: Pertinent items are noted in HPI. Objective:  Physical Exam: Filed Vitals:   05/16/14 0917  BP: 161/83  Pulse: 78  Temp: 98 F (36.7 C)  TempSrc: Oral  Resp: 20  Height: 5' 2"  (1.575 m)  Weight: 164 lb (74.39 kg)  SpO2: 98%   General: sitting in chair, NAD  HEENT: MMM, clear PND, no scleral icterus Cardiac: RRR, no  rubs, murmurs or gallops Pulm: clear to auscultation bilaterally, no crackles, wheezes or rhonchi, moving normal volumes of air Ext: warm and well perfused, no pedal edema Neuro: alert and oriented X3, cranial nerves II-XII grossly intact  Assessment & Plan:  Please see problem oriented charting  Pt discussed with Dr. [name]

## 2014-05-16 NOTE — Assessment & Plan Note (Signed)
BP Readings from Last 3 Encounters:  05/16/14 161/83  03/16/14 134/76  12/08/13 140/78    Lab Results  Component Value Date   NA 138 09/03/2013   K 3.6 09/03/2013   CREATININE 0.79 09/03/2013    Assessment: Blood pressure control:   Progress toward BP goal:    Comments: slightly elevated today in the event of current URI will re-eval when doing better as pt usually has very good controle   Plan: Medications:  Continue dyazide 37.5-25mg Educational resources provided: brochure Self management tools provided: home blood pressure logbook Other plans: will check FLP today

## 2014-05-16 NOTE — Patient Instructions (Signed)
General Instructions:   Thank you for bringing your medicines today. This helps [place] keep you safe from mistakes.  We will set you up for a DEXA bone scan. We will check you cholesterol levels today.;  Follow up with you blood pressure after you are feeling better.   Progress Toward Treatment Goals:  Treatment Goal 11/03/2012  Blood pressure improved  Stop smoking smoking the same amount    Self Care Goals & Plans:  Self Care Goal 05/16/2014  Manage my medications take my medicines as prescribed; bring my medications to every visit; refill my medications on time  Monitor my health keep track of my blood pressure  Eat healthy foods eat smaller portions; eat more vegetables; drink diet soda or water instead of juice or soda  Be physically active find an activity I enjoy  Stop smoking call [facility] (1-800-QUIT-NOW)    No flowsheet data found.   Care Management & Community Referrals:  Referral 11/03/2012  Referrals made for care management support none needed

## 2014-05-16 NOTE — Assessment & Plan Note (Signed)
Pt up to date with most screening.  -DEXA -smoking cessation material provided.

## 2014-05-16 NOTE — Progress Notes (Signed)
Internal Medicine Clinic Attending  Case discussed with Dr. Sadek soon after the resident saw the patient.  We reviewed the resident's history and exam and pertinent patient test results.  I agree with the assessment, diagnosis, and plan of care documented in the resident's note. 

## 2014-05-19 ENCOUNTER — Ambulatory Visit (HOSPITAL_COMMUNITY)
Admission: RE | Admit: 2014-05-19 | Discharge: 2014-05-19 | Disposition: A | Discharge status: Home / Self Care | Payer: 59 | Source: Ambulatory Visit | Attending: Internal Medicine | Admitting: Internal Medicine

## 2014-05-19 DIAGNOSIS — Z13820 Encounter for screening for osteoporosis: Secondary | ICD-10-CM | POA: Diagnosis present

## 2014-05-19 DIAGNOSIS — Z780 Asymptomatic menopausal state: Secondary | ICD-10-CM | POA: Diagnosis not present

## 2014-05-19 DIAGNOSIS — Z0000 Encounter for general adult medical examination without abnormal findings: Secondary | ICD-10-CM

## 2014-06-16 ENCOUNTER — Encounter: Payer: Self-pay | Admitting: Neurology

## 2014-06-16 ENCOUNTER — Ambulatory Visit (INDEPENDENT_AMBULATORY_CARE_PROVIDER_SITE_OTHER): Payer: 59 | Admitting: Neurology

## 2014-06-16 VITALS — BP 138/76 | HR 70 | Temp 98.7°F | Resp 16 | Ht 64.0 in | Wt 161.9 lb

## 2014-06-16 DIAGNOSIS — Z720 Tobacco use: Secondary | ICD-10-CM | POA: Diagnosis not present

## 2014-06-16 DIAGNOSIS — F17200 Nicotine dependence, unspecified, uncomplicated: Secondary | ICD-10-CM

## 2014-06-16 DIAGNOSIS — G43809 Other migraine, not intractable, without status migrainosus: Secondary | ICD-10-CM | POA: Diagnosis not present

## 2014-06-16 NOTE — Progress Notes (Signed)
NEUROLOGY FOLLOW UP OFFICE NOTE  Debra Rodgers [id number]  HISTORY OF PRESENT ILLNESS: Debra Rodgers is a 69 year old right-handed woman with history of OSA (on CPAP), hypertension, hyperlipidemia, depression and migraine who follows up for migraine.    UPDATE: She has been doing well Duration:  15-30 minutes will improve with medication (all day without medication) Frequency:  4 days per month Current abortive therapy:  Cyclobenzaprine 5-45m Current preventative therapy:  topiramate 22m Still smoking Depression/stress:  controlled Sleep hygiene:  good  10/06/13 MRI BRAIN WITH AND WITHOUT:  mild chronic small vessel disease 10/06/13 MRI CERVICAL SPINE W/WO:  multilevel spondylosis with potential neural impingement at left C3, right C4 (with slight cord flattening), either C6, left C7, and bilateral C8.  HISTORY: Onset:  Headaches since her 2068s Improved with treatment of OSA, but exacerbated after hitting her head on the cabinet in February 2015.  CT Head performed 05/21/13 was normal.Since that time, she also feels a little unsteady on her feet. Location:  Starts bilaterally at back of head and radiates up to forehead and around eyes. Some neck pain Quality:  throbbing Intensity:  10/10; December 10/10 Aura:  no Prodrome:  no Associated symptoms:  Lightheadedness, photophobia, phonophobia, osmophobia.  No nausea, visual symptoms or autonomic symptoms. Duration:  2 days.  Resolves with naproxen but comes back during the day; December Several hours (but she will wait to take the Flexeril) Frequency:  4 days per week (16 headache days/month); December 3 to 4 days per month Triggers/exacerbating factors:  Neck movement, perfume, light, noise Relieving factors:  Laying down in dark quiet room Activity:  Able to function through the headache  Past abortive therapy:  Tylenol (headache returns) Past preventative therapy:  amitriptyline 2511mtook briefly),   PAST MEDICAL  HISTORY: Past Medical History  Diagnosis Date  . Depression   . History of migraine headaches     MEDICATIONS: Current Outpatient Prescriptions on File Prior to Visit  Medication Sig Dispense Refill  . famotidine (PEPCID) 20 MG tablet Take 20 mg by mouth daily.     . aMarland Kitchenbuterol (PROVENTIL HFA;VENTOLIN HFA) 108 (90 BASE) MCG/ACT inhaler Inhale 1-2 puffs into the lungs every 6 (six) hours as needed for wheezing or shortness of breath.    . aMarland Kitchenpirin 81 MG EC tablet Take 81 mg by mouth daily.      . aMarland Kitchenorvastatin (LIPITOR) 20 MG tablet TAKE 1 TABLET (20 MG TOTAL) BY MOUTH DAILY. 30 tablet 5  . cyclobenzaprine (FLEXERIL) 5 MG tablet Take 1-2 tabs at onset of headache 30 tablet 3  . FLUoxetine (PROZAC) 20 MG capsule Take 20 mg by mouth daily.    . FMarland KitchenUoxetine (PROZAC) 20 MG capsule TAKE 1 CAPSULE (20 MG TOTAL) BY MOUTH DAILY. 30 capsule 9  . loratadine (CLARITIN) 10 MG tablet Take 10 mg by mouth daily as needed for allergies.    . naproxen sodium (ANAPROX) 220 MG tablet Take 440 mg by mouth daily as needed (for headache).    . topiramate (TOPAMAX) 25 MG tablet Take 1 tablet (25 mg total) by mouth at bedtime. 30 tablet 5  . triamterene-hydrochlorothiazide (DYAZIDE) 37.5-25 MG per capsule TAKE ONE CAPSULE BY MOUTH EVERY DAY 90 capsule 2   No current facility-administered medications on file prior to visit.    ALLERGIES: Allergies  Allergen Reactions  . Codeine Sulfate     REACTION: "makes me high"  . Pantoprazole Sodium     REACTION: Rash, facial swelling  FAMILY HISTORY: Family History  Problem Relation Age of Onset  . Hypertension Mother   . Stroke Mother   . Coronary artery disease Mother   . Heart disease Father   . Diabetes Sister   . Hypertension Sister   . Cancer Sister     SOCIAL HISTORY: History   Social History  . Marital Status: Married    Spouse Name: N/A  . Number of Children: N/A  . Years of Education: N/A   Occupational History  . Not on file.   Social  History Main Topics  . Smoking status: Current Every Day Smoker -- 0.50 packs/day for 35 years    Types: Cigarettes  . Smokeless tobacco: Not on file     Comment: DOWN TO 1/2PACK PER DAY - Quit line info given  . Alcohol Use: No  . Drug Use: No  . Sexual Activity: No   Other Topics Concern  . Not on file   Social History Narrative   Married, Regular Exercise- yes    REVIEW OF SYSTEMS: Constitutional: No fevers, chills, or sweats, no generalized fatigue, change in appetite Eyes: No visual changes, double vision, eye pain Ear, nose and throat: No hearing loss, ear pain, nasal congestion, sore throat Cardiovascular: No chest pain, palpitations Respiratory:  No shortness of breath at rest or with exertion, wheezes GastrointestinaI: No nausea, vomiting, diarrhea, abdominal pain, fecal incontinence Genitourinary:  No dysuria, urinary retention or frequency Musculoskeletal:  No neck pain, back pain Integumentary: No rash, pruritus, skin lesions Neurological: as above Psychiatric: No depression, insomnia, anxiety Endocrine: No palpitations, fatigue, diaphoresis, mood swings, change in appetite, change in weight, increased thirst Hematologic/Lymphatic:  No anemia, purpura, petechiae. Allergic/Immunologic: no itchy/runny eyes, nasal congestion, recent allergic reactions, rashes  PHYSICAL EXAM: Filed Vitals:   06/16/14 0930  BP: 138/76  Pulse: 70  Temp: 98.7 F (37.1 C)  Resp: 16   General: No acute distress Head:  Normocephalic/atraumatic Eyes:  Fundoscopic exam unremarkable without vessel changes, exudates, hemorrhages or papilledema. Neck: supple, no paraspinal tenderness, full range of motion Heart:  Regular rate and rhythm Lungs:  Clear to auscultation bilaterally Back: No paraspinal tenderness Neurological Exam: alert and oriented to person, place, and time. Attention span and concentration intact, recent and remote memory intact, fund of knowledge intact.  Speech fluent  and not dysarthric, language intact.  CN II-XII intact. Fundoscopic exam unremarkable without vessel changes, exudates, hemorrhages or papilledema.  Bulk and tone normal, muscle strength 5/5 throughout.  Sensation to light touch intact.  Deep tendon reflexes 2+ throughout.  Finger to nose testing intact.  Gait normal  IMPRESSION: Cervicogenic migraines, improved Tobacco dependence  PLAN: 1.  Topamax 63m at bedtime 2.  Cyclobenzaprin 5-10mg as needed for abortive therapy 3.  Smoking cessation 4.  Follow up in 6 months.  15 minutes spent with patient, over 50% spent discussing management.  AMetta Clines DO  CC: NClinton Gallant MD

## 2014-06-16 NOTE — Patient Instructions (Signed)
I am glad you are feeling well Continue the topiramate 45m at bedtime Take cyclobenzaprine 5 to 134mwhen you get the headache as prescribed Remember to try and work on smoking cessation Follow up in 6 months.

## 2014-07-08 ENCOUNTER — Other Ambulatory Visit: Payer: Self-pay | Admitting: Internal Medicine

## 2014-07-08 DIAGNOSIS — Z1231 Encounter for screening mammogram for malignant neoplasm of breast: Secondary | ICD-10-CM

## 2014-07-15 ENCOUNTER — Ambulatory Visit (HOSPITAL_COMMUNITY)
Admission: RE | Admit: 2014-07-15 | Discharge: 2014-07-15 | Disposition: A | Discharge status: Home / Self Care | Payer: 59 | Source: Ambulatory Visit | Attending: Internal Medicine | Admitting: Internal Medicine

## 2014-07-15 DIAGNOSIS — Z1231 Encounter for screening mammogram for malignant neoplasm of breast: Secondary | ICD-10-CM

## 2014-08-03 ENCOUNTER — Encounter: Payer: Self-pay | Admitting: *Deleted

## 2014-08-12 ENCOUNTER — Ambulatory Visit (INDEPENDENT_AMBULATORY_CARE_PROVIDER_SITE_OTHER): Payer: 59 | Admitting: Internal Medicine

## 2014-08-12 VITALS — BP 133/89 | HR 73 | Temp 98.1°F

## 2014-08-12 DIAGNOSIS — R059 Cough, unspecified: Secondary | ICD-10-CM | POA: Insufficient documentation

## 2014-08-12 DIAGNOSIS — R05 Cough: Secondary | ICD-10-CM | POA: Diagnosis not present

## 2014-08-12 MED ORDER — MONTELUKAST SODIUM 10 MG PO TABS: 10.0000 mg | ORAL_TABLET | Freq: Every day | ORAL | Status: DC

## 2014-08-12 MED ORDER — ALBUTEROL SULFATE HFA 108 (90 BASE) MCG/ACT IN AERS: 1.0000 | INHALATION_SPRAY | Freq: Four times a day (QID) | RESPIRATORY_TRACT | Status: DC | PRN

## 2014-08-12 MED ORDER — PREDNISONE 20 MG PO TABS: ORAL_TABLET | ORAL | Status: AC

## 2014-08-12 NOTE — Progress Notes (Signed)
Internal Medicine Clinic Attending  Case discussed with Dr. Sadek soon after the resident saw the patient.  We reviewed the resident's history and exam and pertinent patient test results.  I agree with the assessment, diagnosis, and plan of care documented in the resident's note. 

## 2014-08-12 NOTE — Progress Notes (Signed)
Subjective:   Patient ID: Shaunta Oncale female   DOB: May 20, 1945 69 y.o.   MRN: [id number]  HPI: Ms.Teniola Carmical is a 69 y.o. woman pmh as listed below presents for acute cough.  And states that her cough has happened for the past week productive of clear sputum. She seems to have increasing nasal drainage and postnasal drip that seems sacral with the cough along with sneezing and itchy eyes. He denied any fevers or chills. Her appetite is near baseline she has not had any significant weight loss, nausea vomiting or diarrhea. She has had no rashes, no facial pain, slight minimal frontal headache. And some muscle skeletal chest pain secondary to ongoing coughing. The coughing seems to be worse at night. She has not had any significant wheezing. She is smoking. He also denied significant joints of breath or dyspnea on exertion that limited her ADLs.   Past Medical History  Diagnosis Date  . Depression   . History of migraine headaches    Current Outpatient Prescriptions  Medication Sig Dispense Refill  . albuterol (PROVENTIL HFA;VENTOLIN HFA) 108 (90 BASE) MCG/ACT inhaler Inhale 1-2 puffs into the lungs every 6 (six) hours as needed for wheezing or shortness of breath. 18 g 12  . aspirin 81 MG EC tablet Take 81 mg by mouth daily.      Marland Kitchen atorvastatin (LIPITOR) 20 MG tablet TAKE 1 TABLET (20 MG TOTAL) BY MOUTH DAILY. 30 tablet 5  . cetirizine (ZYRTEC) 10 MG tablet Take 10 mg by mouth daily.    . cyclobenzaprine (FLEXERIL) 5 MG tablet Take 1-2 tabs at onset of headache 30 tablet 3  . famotidine (PEPCID) 20 MG tablet Take 20 mg by mouth daily.     Marland Kitchen FLUoxetine (PROZAC) 20 MG capsule TAKE 1 CAPSULE (20 MG TOTAL) BY MOUTH DAILY. 30 capsule 9  . loratadine (CLARITIN) 10 MG tablet Take 10 mg by mouth daily as needed for allergies.    . montelukast (SINGULAIR) 10 MG tablet Take 1 tablet (10 mg total) by mouth at bedtime. 30 tablet 1  . naproxen sodium (ANAPROX) 220 MG tablet Take 440 mg by mouth  daily as needed (for headache).    . predniSONE (DELTASONE) 20 MG tablet Take 2 pills for 3 days then 1  Pill until finished 10 tablet 0  . topiramate (TOPAMAX) 25 MG tablet Take 1 tablet (25 mg total) by mouth at bedtime. 30 tablet 5  . triamterene-hydrochlorothiazide (DYAZIDE) 37.5-25 MG per capsule TAKE ONE CAPSULE BY MOUTH EVERY DAY 90 capsule 2   No current facility-administered medications for this visit.   Family History  Problem Relation Age of Onset  . Hypertension Mother   . Stroke Mother   . Coronary artery disease Mother   . Heart disease Father   . Diabetes Sister   . Hypertension Sister   . Cancer Sister    History   Social History  . Marital Status: Married    Spouse Name: N/A  . Number of Children: N/A  . Years of Education: N/A   Social History Main Topics  . Smoking status: Current Every Day Smoker -- 0.50 packs/day for 35 years    Types: Cigarettes  . Smokeless tobacco: Not on file     Comment: DOWN TO 1/2PACK PER DAY - Quit line info given  . Alcohol Use: No  . Drug Use: No  . Sexual Activity: No   Other Topics Concern  . Not on file   Social History Narrative  Married, Regular Exercise- yes   Review of Systems: Pertinent items are noted in HPI. Objective:  Physical Exam: Filed Vitals:   08/12/14 1049  BP: 133/89  Pulse: 73  Temp: 98.1 F (36.7 C)  TempSrc: Oral   General: sitting in chair, NAD HEENT: PERRL, EOMI, no scleral icterus, bilateral cerumen impaction, clear PND, no sinus tenderness, no cervical LAD, no tonsillar exudates but some erythema Cardiac: RRR, no rubs, murmurs or gallops Pulm: clear to auscultation bilaterally, no crackles, wheezes, or rhonchi, moving normal volumes of air Abd: soft, nontender, nondistended, BS present Ext: warm and well perfused, no pedal edema  Assessment & Plan:  Please see problem oriented charting  Pt discussed with Dr. Lynnae January

## 2014-08-12 NOTE — Patient Instructions (Signed)
General Instructions:   Thank you for bringing your medicines today. This helps [place] keep you safe from mistakes.   For your nose: likely allergy related.  1. Start taking the prednisone 76m (2 tablets) for 3 days and then 1 tablet (268m until finished 2. Start taking the singular 1047mveryday once you start the steriods 3. If this doesn't work come back and we will probably need you to get allergy tested    Progress Toward Treatment Goals:  Treatment Goal 11/03/2012  Blood pressure improved  Stop smoking smoking the same amount    Self Care Goals & Plans:  Self Care Goal 05/16/2014  Manage my medications take my medicines as prescribed; bring my medications to every visit; refill my medications on time  Monitor my health keep track of my blood pressure  Eat healthy foods eat smaller portions; eat more vegetables; drink diet soda or water instead of juice or soda  Be physically active find an activity I enjoy  Stop smoking call [facility] (1-800-QUIT-NOW)    No flowsheet data found.   Care Management & Community Referrals:  Referral 11/03/2012  Referrals made for care management support none needed

## 2014-08-12 NOTE — Assessment & Plan Note (Signed)
Pt symptoms likely in the setting of allergic rhinitis. No signs or symptoms of infection. Pt is refusing to use nasal sprays at this time.  -will try short prednisone taper -begin singular since OTC antihistamines are not working -if fails to improve f/u and consider allergy referral for desensitization

## 2014-08-26 ENCOUNTER — Ambulatory Visit (INDEPENDENT_AMBULATORY_CARE_PROVIDER_SITE_OTHER): Payer: 59 | Admitting: Internal Medicine

## 2014-08-26 ENCOUNTER — Encounter: Payer: Self-pay | Admitting: Internal Medicine

## 2014-08-26 VITALS — BP 131/82 | HR 73 | Temp 98.6°F | Wt 159.8 lb

## 2014-08-26 DIAGNOSIS — F17210 Nicotine dependence, cigarettes, uncomplicated: Secondary | ICD-10-CM

## 2014-08-26 DIAGNOSIS — I10 Essential (primary) hypertension: Secondary | ICD-10-CM

## 2014-08-26 DIAGNOSIS — R05 Cough: Secondary | ICD-10-CM

## 2014-08-26 DIAGNOSIS — R059 Cough, unspecified: Secondary | ICD-10-CM

## 2014-08-26 NOTE — Assessment & Plan Note (Addendum)
Patient's symptoms of allergic rhinitis have improved significantly but not fully resolved on a prednisone taper (now completed) and Singulair. She has also been using Benadryl and an over the counter cough syrup. Patient refuses a trial of Flonase or any other nasal sprays; she finds nasal sprays extremely uncomfortable and is not willing to try them, even if this makes her symptoms less likely to improve. 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 No history of hemoptysis, fever, chills, night sweats, weight loss (weight 161-->164 over past year) or any other alarm symptoms.   - Return to clinic in 2 weeks if symptoms have not improved - Consider allergy referral at that time for desensitization if there has been no improvement

## 2014-08-26 NOTE — Patient Instructions (Signed)
Please continue to use your benadryl and singulair as prescribed.  If you have not improved in 2 weeks, please return to clinic; we can consider sending you to an Allergy Specialist at that time for further testing  General Instructions:   Please bring your medicines with you each time you come to clinic.  Medicines may include prescription medications, over-the-counter medications, herbal remedies, eye drops, vitamins, or other pills.   Progress Toward Treatment Goals:  Treatment Goal 11/03/2012  Blood pressure improved  Stop smoking smoking the same amount    Self Care Goals & Plans:  Self Care Goal 05/16/2014  Manage my medications take my medicines as prescribed; bring my medications to every visit; refill my medications on time  Monitor my health keep track of my blood pressure  Eat healthy foods eat smaller portions; eat more vegetables; drink diet soda or water instead of juice or soda  Be physically active find an activity I enjoy  Stop smoking call [facility] (1-800-QUIT-NOW)    No flowsheet data found.   Care Management & Community Referrals:  Referral 11/03/2012  Referrals made for care management support none needed

## 2014-08-26 NOTE — Progress Notes (Signed)
Internal Medicine Clinic Attending  Case discussed with Dr. Mallory at the time of the visit.  We reviewed the resident's history and exam and pertinent patient test results.  I agree with the assessment, diagnosis, and plan of care documented in the resident's note. 

## 2014-08-26 NOTE — Assessment & Plan Note (Signed)
BP Readings from Last 3 Encounters:  08/26/14 131/82  08/12/14 133/89  06/16/14 138/76    Lab Results  Component Value Date   NA 138 09/03/2013   K 3.6 09/03/2013   CREATININE 0.79 09/03/2013    Assessment: Blood pressure control: well-controlled   Progress toward BP goal: at goal    Plan: Medications:  continue current medications; dyazide 37.5-25 mg daily

## 2014-08-26 NOTE — Progress Notes (Signed)
[facility] Subjective:   Patient ID: Isma Tietje female   DOB: 01/05/1946 69 y.o.   MRN: [id number]  HPI: Ms.Kamani Macdonell is a 69 y.o. female with a history of hyperlipidemia, hypertension, depression and migraines who presents to clinic for a 2-week follow up of her cough. She was evaluated on 08/12/14 for cough productive of clear sputum, sneezing and itchy eyes. At that time, she was put on a prednisone taper and Singulair.   She noticed improvement in all of her symptoms, but they have not fully resolved. She continues to have occasional cough productive of clear sputum and some sneezing. She completed her prednisone taper and has been taking Singulair and Benadryl. She has no sick contacts and denies subjective fever or chills.   Past Medical History  Diagnosis Date  . Depression   . History of migraine headaches    Current Outpatient Prescriptions  Medication Sig Dispense Refill  . albuterol (PROVENTIL HFA;VENTOLIN HFA) 108 (90 BASE) MCG/ACT inhaler Inhale 1-2 puffs into the lungs every 6 (six) hours as needed for wheezing or shortness of breath. 18 g 12  . aspirin 81 MG EC tablet Take 81 mg by mouth daily.      Marland Kitchen atorvastatin (LIPITOR) 20 MG tablet TAKE 1 TABLET (20 MG TOTAL) BY MOUTH DAILY. 30 tablet 5  . cetirizine (ZYRTEC) 10 MG tablet Take 10 mg by mouth daily.    . cyclobenzaprine (FLEXERIL) 5 MG tablet Take 1-2 tabs at onset of headache 30 tablet 3  . famotidine (PEPCID) 20 MG tablet Take 20 mg by mouth daily.     Marland Kitchen FLUoxetine (PROZAC) 20 MG capsule TAKE 1 CAPSULE (20 MG TOTAL) BY MOUTH DAILY. 30 capsule 9  . loratadine (CLARITIN) 10 MG tablet Take 10 mg by mouth daily as needed for allergies.    . montelukast (SINGULAIR) 10 MG tablet Take 1 tablet (10 mg total) by mouth at bedtime. 30 tablet 1  . naproxen sodium (ANAPROX) 220 MG tablet Take 440 mg by mouth daily as needed (for headache).    . topiramate (TOPAMAX) 25 MG tablet Take 1 tablet (25 mg  total) by mouth at bedtime. 30 tablet 5  . triamterene-hydrochlorothiazide (DYAZIDE) 37.5-25 MG per capsule TAKE ONE CAPSULE BY MOUTH EVERY DAY 90 capsule 2   No current facility-administered medications for this visit.   Family History  Problem Relation Age of Onset  . Hypertension Mother   . Stroke Mother   . Coronary artery disease Mother   . Heart disease Father   . Diabetes Sister   . Hypertension Sister   . Cancer Sister    History   Social History  . Marital Status: Married    Spouse Name: N/A  . Number of Children: N/A  . Years of Education: N/A   Social History Main Topics  . Smoking status: Current Every Day Smoker -- 0.50 packs/day for 35 years    Types: Cigarettes  . Smokeless tobacco: Not on file     Comment: DOWN TO 1/2PACK PER DAY - Quit line info given  . Alcohol Use: No  . Drug Use: No  . Sexual Activity: No   Other Topics Concern  . None   Social History Narrative   Married, Regular Exercise- yes   Review of Systems: General: no recent illness, no sick contacts Skin: no rashes or lesions HEENT: no headaches, itchy eyes, see HPI  Cardiac: no chest pain or palpitations Respiratory: no shortness of breath, but has  cough, sneezing GI: no changes Urinary: no changes Msk: no pain or swelling Psychiatric: history of depression  Objective:  Physical Exam: Filed Vitals:   08/26/14 1032  BP: 131/82  Pulse: 73  Temp: 98.6 F (37 C)  TempSrc: Oral  Weight: 159 lb 12.8 oz (72.485 kg)  SpO2: 100%   Appearance: in NAD, occasional cough during interview HEENT: AT/[place], PERRL, EOMi, no lymphadenopathy Heart: RRR, normal S1S2  Lungs: CTAB, no wheezes, rales or rhonchi, normal work of breathing Abdomen: bowel sounds present, soft, nontender Musculoskeletal: normal range of motion, midline paraspinal tenderness Neurologic: A&Ox3, grossly intact Skin: no rashes or lesions  Assessment & Plan:  Case discussed with Dr. Ellwood Dense  Cough Patient's  symptoms of allergic rhinitis have improved significantly but not fully resolved on a prednisone taper (now completed) and Singulair. She has also been using Benadryl and an over the counter cough syrup. Patient refuses a trial of Flonase or any other nasal sprays; she finds nasal sprays extremely uncomfortable and is not willing to try them, even if this makes her symptoms less likely to improve. 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 No history of hemoptysis, fever, chills, night sweats, weight loss (weight 161-->164 over past year) or any other alarm symptoms.   - Return to clinic in 2 weeks if symptoms have not improved - Consider allergy referral at that time for desensitization if there has been no improvement     Hypertension, well controlled BP Readings from Last 3 Encounters:  08/26/14 131/82  08/12/14 133/89  06/16/14 138/76    Lab Results  Component Value Date   NA 138 09/03/2013   K 3.6 09/03/2013   CREATININE 0.79 09/03/2013    Assessment: Blood pressure control: well-controlled   Progress toward BP goal: at goal    Plan: Medications:  continue current medications; dyazide 37.5-25 mg daily      Medications Ordered No orders of the defined types were placed in this encounter.   Other Orders No orders of the defined types were placed in this encounter.

## 2014-09-04 ENCOUNTER — Other Ambulatory Visit: Payer: Self-pay | Admitting: Internal Medicine

## 2014-09-12 ENCOUNTER — Ambulatory Visit: Payer: Self-pay | Admitting: Internal Medicine

## 2014-10-08 ENCOUNTER — Other Ambulatory Visit: Payer: Self-pay | Admitting: Neurology

## 2014-11-06 ENCOUNTER — Other Ambulatory Visit: Payer: Self-pay | Admitting: Internal Medicine

## 2014-12-19 ENCOUNTER — Ambulatory Visit: Payer: Self-pay | Admitting: Neurology

## 2015-01-21 ENCOUNTER — Other Ambulatory Visit: Payer: Self-pay | Admitting: Internal Medicine

## 2015-04-03 ENCOUNTER — Encounter: Payer: Self-pay | Admitting: Internal Medicine

## 2015-04-17 ENCOUNTER — Encounter: Payer: Self-pay | Admitting: Internal Medicine

## 2015-04-17 ENCOUNTER — Ambulatory Visit (INDEPENDENT_AMBULATORY_CARE_PROVIDER_SITE_OTHER): Payer: 59 | Admitting: Internal Medicine

## 2015-04-17 ENCOUNTER — Ambulatory Visit (HOSPITAL_COMMUNITY)
Admission: RE | Admit: 2015-04-17 | Discharge: 2015-04-17 | Disposition: A | Discharge status: Home / Self Care | Payer: 59 | Source: Ambulatory Visit | Attending: Internal Medicine | Admitting: Internal Medicine

## 2015-04-17 VITALS — BP 166/111 | HR 75 | Temp 98.1°F | Ht 64.0 in | Wt 158.7 lb

## 2015-04-17 DIAGNOSIS — I517 Cardiomegaly: Secondary | ICD-10-CM | POA: Diagnosis not present

## 2015-04-17 DIAGNOSIS — F339 Major depressive disorder, recurrent, unspecified: Secondary | ICD-10-CM | POA: Insufficient documentation

## 2015-04-17 DIAGNOSIS — I208 Other forms of angina pectoris: Secondary | ICD-10-CM | POA: Diagnosis not present

## 2015-04-17 DIAGNOSIS — I10 Essential (primary) hypertension: Secondary | ICD-10-CM | POA: Diagnosis not present

## 2015-04-17 DIAGNOSIS — Z720 Tobacco use: Secondary | ICD-10-CM | POA: Insufficient documentation

## 2015-04-17 DIAGNOSIS — F17210 Nicotine dependence, cigarettes, uncomplicated: Secondary | ICD-10-CM

## 2015-04-17 DIAGNOSIS — R9431 Abnormal electrocardiogram [ECG] [EKG]: Secondary | ICD-10-CM | POA: Insufficient documentation

## 2015-04-17 DIAGNOSIS — F330 Major depressive disorder, recurrent, mild: Secondary | ICD-10-CM

## 2015-04-17 MED ORDER — NICOTINE 14 MG/24HR TD PT24: 14.0000 mg | MEDICATED_PATCH | Freq: Every day | TRANSDERMAL | Status: DC

## 2015-04-17 MED ORDER — FLUOXETINE HCL 40 MG PO CAPS: 40.0000 mg | ORAL_CAPSULE | Freq: Every day | ORAL | Status: DC

## 2015-04-17 MED ORDER — NITROGLYCERIN 0.3 MG SL SUBL: 0.3000 mg | SUBLINGUAL_TABLET | SUBLINGUAL | Status: DC | PRN

## 2015-04-17 MED ORDER — AMLODIPINE BESYLATE 5 MG PO TABS: 5.0000 mg | ORAL_TABLET | Freq: Every day | ORAL | Status: DC

## 2015-04-17 MED ORDER — NICOTINE POLACRILEX 4 MG MT GUM: 4.0000 mg | CHEWING_GUM | OROMUCOSAL | Status: DC | PRN

## 2015-04-17 NOTE — Assessment & Plan Note (Signed)
She smoking 1 pack per day and is very adjusted in quitting. I prescribed her a nicotine patch and gum, and referred to the [place] quit line. Depending how she does, we can consider starting bupropion at the next visit; I hope this could additionally help with her depression, and it's mild serotonergic effects would not likely interact with fluoxetine.

## 2015-04-17 NOTE — Assessment & Plan Note (Signed)
She tells me she has had a depressed mood for the last year or so, and would like to go up on her fluoxetine to 40 mg daily. I've made that change today. At the next visit, I would like to get a PHQ9 score to objectively quantify her progress.

## 2015-04-17 NOTE — Assessment & Plan Note (Signed)
She described to me typical stable angina with risk factors of smoking and hypertension, concerning for underlying coronary artery disease. I checked an EKG that showed normal sinus rhythm with left ventricular hypertrophy, without ST changes or bundle-branch blocks. She seems to be quite functional, so I referred her for a treadmill stress test. She tells me she will be able to walk fast enough to reproduce her chest pain. I'm hoping we will be able to catch her coronary artery disease early. I have also prescribed her sublingual nitroglycerin when she has episodes of chest pain. We are currently working to better control her hypertension with amlodipine; I hope this will also have any antianginal effect. She is also cutting back on smoking was just prescribed the nicotine patch and gum.

## 2015-04-17 NOTE — Progress Notes (Signed)
Patient ID: Debra Rodgers, female   DOB: 10/31/1945, 70 y.o.   MRN: [id number] [facility] Subjective:   Patient ID: Debra Rodgers female   DOB: [birth year] 70 y.o.   MRN: [id number]  HPI: [name] is a 70 y.o. female with a history of essential hypertension, intermittent migraines, mild intermittent asthma, and seasonal allergies presenting for follow-up of her hypertension, depression, and smoking.  Stable angina: Today, she complains of exertional sharp substernal chest pain with associated dyspnea that has been occurring for the last 6 months. Her risk factors for coronary artery disease include hypertension and smoking. Her mother had a heart attack in her 48s, but there is no other family history of cardiac disease.  Hypertension: Her pressures at home have been in the 150s to 160s on triamterene and hydrochlorothiazide. She has never been on another medication.  Depression: She says she has been feeling more down than usual over the last year since her nephew died unexpectedly last year. She would like to increase the dose of her fluoxetine.  Tobacco use: She is smoking 1 pack per day, and is very interested in quitting. She has never tried the patch or gum, and would like to try this today.  I've reviewed her medications  Review of Systems  Constitutional: Negative for fever, chills, weight loss and malaise/fatigue.  HENT: Negative for hearing loss.   Respiratory: Positive for shortness of breath. Negative for cough and wheezing.   Cardiovascular: Positive for chest pain. Negative for palpitations, orthopnea, claudication and leg swelling.  Gastrointestinal: Negative for abdominal pain, diarrhea and constipation.  Skin: Negative for itching and rash.  Neurological: Negative for dizziness, focal weakness, loss of consciousness and headaches.    Objective:  Physical Exam: Filed Vitals:   04/17/15 1322  BP: 166/111  Pulse: 75  Temp: 98.1 F (36.7  C)  TempSrc: Oral  Height: 5' 4"  (1.626 m)  Weight: 158 lb 11.2 oz (71.986 kg)  SpO2: 100%   General: slightly reserved black lady sitting in chair comfortably HEENT: no scleral icterus, extra-ocular muscles intact, oropharynx without lesions Cardiac: regular rate and rhythm, no rubs, murmurs or gallops Pulm: breathing well, clear to auscultation bilaterally Ext: warm and well perfused, without pedal edema Lymph: no cervical or supraclavicular lymphadenopathy Skin: no rash, hair, or nail changes Neuro: alert and oriented X3, cranial nerves II-XII grossly intact, moving all extremities well  Assessment & Plan:   Case discussed with Dr. Eppie Gibson  Stable angina [facility]) She described to me typical stable angina with risk factors of smoking and hypertension, concerning for underlying coronary artery disease. I checked an EKG that showed normal sinus rhythm with left ventricular hypertrophy, without ST changes or bundle-branch blocks. She seems to be quite functional, so I referred her for a treadmill stress test. She tells me she will be able to walk fast enough to reproduce her chest pain. I'm hoping we will be able to catch her coronary artery disease early. I have also prescribed her sublingual nitroglycerin when she has episodes of chest pain. We are currently working to better control her hypertension with amlodipine; I hope this will also have any antianginal effect. She is also cutting back on smoking was just prescribed the nicotine patch and gum.  Essential hypertension I manually checked her blood pressure today and it was 160/110. I have added amlodipine 5 mg daily in addition to triamterene 37.5 mg daily and hydrochlorothiazide 25 mg daily. At her one-month follow-up blood pressure re-check, we  can consider going up on the amlodipine if her pressures remain elevated.  Major depressive disorder, recurrent episode ([place]) She tells me she has had a depressed mood for the last year or so, and  would like to go up on her fluoxetine to 40 mg daily. I've made that change today. At the next visit, I would like to get a PHQ9 score to objectively quantify her progress.  Tobacco use She smoking 1 pack per day and is very adjusted in quitting. I prescribed her a nicotine patch and gum, and referred to the [place] quit line. Depending how she does, we can consider starting bupropion at the next visit; I hope this could additionally help with her depression, and it's mild serotonergic effects would not likely interact with fluoxetine.   Medications Ordered Meds ordered this encounter  Medications  . amLODipine (NORVASC) 5 MG tablet    Sig: Take 1 tablet (5 mg total) by mouth daily.    Dispense:  90 tablet    Refill:  11  . nitroGLYCERIN (NITROSTAT) 0.3 MG SL tablet    Sig: Place 1 tablet (0.3 mg total) under the tongue every 5 (five) minutes as needed for chest pain.    Dispense:  100 tablet    Refill:  3  . nicotine (NICODERM CQ - DOSED IN MG/24 HOURS) 14 mg/24hr patch    Sig: Place 1 patch (14 mg total) onto the skin daily.    Dispense:  90 patch    Refill:  3  . FLUoxetine (PROZAC) 40 MG capsule    Sig: Take 1 capsule (40 mg total) by mouth daily.    Dispense:  90 capsule    Refill:  3  . nicotine polacrilex (NICORETTE) 4 MG gum    Sig: Take 1 each (4 mg total) by mouth as needed for smoking cessation.    Dispense:  100 tablet    Refill:  0   Other Orders Orders Placed This Encounter  Procedures  . Exercise Tolerance Test    Standing Status: Future     Number of Occurrences:      Standing Expiration Date: 07/15/2016    Order Specific Question:  Where should this test be performed    Answer:  [facility]    Order Specific Question:  Stress with Dobutamine or Treadmill with exercise?    Answer:  Treadmill w/ exercise  . EKG 12-Lead  . EKG 12-Lead    Ordered by Oval Linsey    Follow Up: Return in about 4 weeks (around 05/15/2015) for blood pressure follow up.

## 2015-04-17 NOTE — Patient Instructions (Signed)
Ms. Ciccarelli,  He was a pleasure meeting you today.  We talked about a few things today:  1. Your chest pain when your walking could be from your heart. We will get an exercise stress test to see if your heart isn't getting enough blood when you exercise; nurse Regino Schultze will help arrange this for you. If you starthaving chest pain, I've also prescribed you a nitroglycerin tablet that you can put on your tongue.  2. Your blood pressure continues to be quite high. We started amlodipine 5 mg daily. I'll see you in 1 month to re-check your blood pressure.  3. Quitting smoking is hands down the best thing you can do for your health. I've prescribed you a nicotine patch to put on daily. If you feel the urge to smoke, you can also chew some gum. You can do this!  4. We've also went up on your fluoxetine from 20 to 40 mg daily. If this makes her feel foggy, you can go back to the 20 mg dose, just let me know.  Take care, and I'll see you in 1 month, Dr. Melburn Hake

## 2015-04-17 NOTE — Assessment & Plan Note (Signed)
I manually checked her blood pressure today and it was 160/110. I have added amlodipine 5 mg daily in addition to triamterene 37.5 mg daily and hydrochlorothiazide 25 mg daily. At her one-month follow-up blood pressure re-check, we can consider going up on the amlodipine if her pressures remain elevated.

## 2015-04-18 NOTE — Progress Notes (Signed)
Case discussed with Dr. Melburn Hake at the time of the visit. We reviewed the resident's history and exam and pertinent patient test results. I agree with the assessment, diagnosis, and plan of care documented in the resident's note.

## 2015-05-08 ENCOUNTER — Other Ambulatory Visit: Payer: Self-pay | Admitting: Internal Medicine

## 2015-05-22 ENCOUNTER — Other Ambulatory Visit: Payer: Self-pay | Admitting: Internal Medicine

## 2015-05-25 ENCOUNTER — Ambulatory Visit (INDEPENDENT_AMBULATORY_CARE_PROVIDER_SITE_OTHER): Payer: 59 | Admitting: Internal Medicine

## 2015-05-25 ENCOUNTER — Encounter: Payer: Self-pay | Admitting: Internal Medicine

## 2015-05-25 VITALS — BP 176/88 | HR 74 | Temp 98.4°F | Ht 64.0 in | Wt 161.3 lb

## 2015-05-25 DIAGNOSIS — F339 Major depressive disorder, recurrent, unspecified: Secondary | ICD-10-CM

## 2015-05-25 DIAGNOSIS — F330 Major depressive disorder, recurrent, mild: Secondary | ICD-10-CM

## 2015-05-25 DIAGNOSIS — Z79899 Other long term (current) drug therapy: Secondary | ICD-10-CM

## 2015-05-25 DIAGNOSIS — F17210 Nicotine dependence, cigarettes, uncomplicated: Secondary | ICD-10-CM

## 2015-05-25 DIAGNOSIS — E785 Hyperlipidemia, unspecified: Secondary | ICD-10-CM

## 2015-05-25 DIAGNOSIS — I208 Other forms of angina pectoris: Secondary | ICD-10-CM

## 2015-05-25 DIAGNOSIS — Z720 Tobacco use: Secondary | ICD-10-CM

## 2015-05-25 DIAGNOSIS — I10 Essential (primary) hypertension: Secondary | ICD-10-CM | POA: Diagnosis not present

## 2015-05-25 MED ORDER — AMLODIPINE BESYLATE 10 MG PO TABS: 10.0000 mg | ORAL_TABLET | Freq: Every day | ORAL | Status: DC

## 2015-05-25 NOTE — Assessment & Plan Note (Signed)
History: She continues to have 6 out of 10 chest pressure when she walks up one flight of stairs. She has not taken the nitroglycerin, because she only likes to take medicines when she absolutely wants to. Her risk factors for coronary artery disease include hypertension, smoking, and hyperlipidemia. She was unable to get her stress test because she was never contacted.  Assessment: I think she could have underlying coronary artery disease, so we will continue with an exercise stress test.  Plan: Her exercise stress test is scheduled for March 7. I'll see her back in 2 weeks to ensure those results are followed closely.

## 2015-05-25 NOTE — Assessment & Plan Note (Signed)
History: She reports her mood is improved since increasing the fluoxetine to 40 mg daily last month.  Assessment: Her pH Q9 score today was 9, with the highest scores in sleeping too much and having little energy. I think we can continue with the fluoxetine. Should she be interested in a medication to help her quit smoking, I think bupropion would be a good choice to additionally help her low energy levels affected by her depression.  Plan: Continue fluoxetine 40 mg daily. If she wants to start a medication to help her quit smoking, I think bupropion would be a good choice.

## 2015-05-25 NOTE — Progress Notes (Signed)
Patient ID: Debra Rodgers, female   DOB: 11/12/1945, 70 y.o.   MRN: [id number] [facility] Subjective:   Patient ID: Debra Rodgers female   DOB: 11-Aug-1945 70 y.o.   MRN: [id number]  HPI: Debra Rodgers is a 70 y.o. female with essential hypertension, stable angina, major depressive disorder, and tobacco abuse presenting for follow-up of stable angina, hypertension, depression, and tobacco abuse.  Please see the assessment and plan for the status of the patient's chronic medical problems.  I have reviewed her medications.  She has dropped her smoking from 10 to 3 cigarettes per day.  Review of Systems  Constitutional: Negative for fever.  Respiratory: Negative for shortness of breath.   Cardiovascular: Positive for chest pain. Negative for palpitations, orthopnea and leg swelling.  Psychiatric/Behavioral: Positive for depression. Negative for substance abuse. The patient is not nervous/anxious.    Objective:  Physical Exam: Filed Vitals:   05/25/15 0918  BP: 176/88  Pulse: 74  Temp: 98.4 F (36.9 C)  TempSrc: Oral  Height: 5' 4"  (1.626 m)  Weight: 161 lb 4.8 oz (73.165 kg)  SpO2: 100%   General: resting in chair comfortably, appropriately conversational Cardiac: regular rate and rhythm, no murmurs or gallops Pulm: breathing well, clear to auscultation bilaterally Ext: warm and well perfused, without pedal edema Lymph: no cervical or supraclavicular lymphadenopathy Skin: no rash, hair, or nail changes  Assessment & Plan:  Case discussed with Dr. Beryle Beams  Stable angina [facility]) History: She continues to have 6 out of 10 chest pressure when she walks up one flight of stairs. She has not taken the nitroglycerin, because she only likes to take medicines when she absolutely wants to. Her risk factors for coronary artery disease include hypertension, smoking, and hyperlipidemia. She was unable to get her stress test because she was never  contacted.  Assessment: I think she could have underlying coronary artery disease, so we will continue with an exercise stress test.  Plan: Her exercise stress test is scheduled for March 7. I'll see her back in 2 weeks to ensure those results are followed closely.   Essential hypertension History: Her home pressures have ranged from 150-170. She denies any orthostatic hypotension nor any side effects from the amlodipine restarted last visit.  Assessment: She still is above her goal of 150/90, so we will increase her amlodipine.  Plan: Today increased her amlodipine from 5-10 mg daily.   Hyperlipidemia I have ordered a lipid panel today.  Tobacco use History: She has dropped her smoking from 10-3 cigarettes daily, without any nicotine replacement therapy.  Assessment:  She is in the action phase of quitting, but does not want any medical assistance.  Plan: Continue encouragement and offering medical assistance to assist her in quitting smoking.   Major depressive disorder, recurrent episode ([place]) History: She reports her mood is improved since increasing the fluoxetine to 40 mg daily last month.  Assessment: Her pH Q9 score today was 9, with the highest scores in sleeping too much and having little energy. I think we can continue with the fluoxetine. Should she be interested in a medication to help her quit smoking, I think bupropion would be a good choice to additionally help her low energy levels affected by her depression.  Plan: Continue fluoxetine 40 mg daily. If she wants to start a medication to help her quit smoking, I think bupropion would be a good choice.   Medications Ordered Meds ordered this encounter  Medications  . amLODipine (NORVASC)  10 MG tablet    Sig: Take 1 tablet (10 mg total) by mouth daily.    Dispense:  90 tablet    Refill:  11   Other Orders Orders Placed This Encounter  Procedures  . Lipid Profile   Follow Up: Return in about 2 weeks (around  06/08/2015).

## 2015-05-25 NOTE — Assessment & Plan Note (Signed)
History: Her home pressures have ranged from 150-170. She denies any orthostatic hypotension nor any side effects from the amlodipine restarted last visit.  Assessment: She still is above her goal of 150/90, so we will increase her amlodipine.  Plan: Today increased her amlodipine from 5-10 mg daily.

## 2015-05-25 NOTE — Assessment & Plan Note (Signed)
I have ordered a lipid panel today.

## 2015-05-25 NOTE — Patient Instructions (Signed)
Ms. Drolet,  It was great to see you again today!  I'm quite concerned about this chest pain you having when you exert herself, and I think a stress test would be very important to see if you have blockages in your heart arteries. I'll make sure we get that scheduled for you, and I like to see you in 2 weeks.  Your blood pressure is still little high, so we will go up on the amlodipine to 10 mg daily.  You doing an excellent job quitting smoking! Keep up the good work, and let me know when you're ready to try the patches.  Take care, and I will see you in 2 weeks, Dr. Melburn Hake

## 2015-05-25 NOTE — Assessment & Plan Note (Signed)
History: She has dropped her smoking from 10-3 cigarettes daily, without any nicotine replacement therapy.  Assessment:  She is in the action phase of quitting, but does not want any medical assistance.  Plan: Continue encouragement and offering medical assistance to assist her in quitting smoking.

## 2015-05-25 NOTE — Progress Notes (Signed)
Medicine attending: Medical history, presenting problems, physical findings, and medications, reviewed with resident physician Dr Loleta Chance on the day of the patient visit and I concur with his evaluation and management plan.

## 2015-05-26 LAB — LIPID PANEL
Chol/HDL Ratio: 2.3 ratio units (ref 0.0–4.4)
Cholesterol, Total: 173 mg/dL (ref 100–199)
HDL: 75 mg/dL (ref >39)
LDL Calculated: 83 mg/dL (ref 0–99)
Triglycerides: 73 mg/dL (ref 0–149)
VLDL Cholesterol Cal: 15 mg/dL (ref 5–40)

## 2015-05-30 ENCOUNTER — Ambulatory Visit (INDEPENDENT_AMBULATORY_CARE_PROVIDER_SITE_OTHER): Payer: 59

## 2015-05-30 DIAGNOSIS — I208 Other forms of angina pectoris: Secondary | ICD-10-CM

## 2015-05-30 LAB — EXERCISE TOLERANCE TEST
Estimated workload: 6.9 METS
Exercise duration (min): 4 min
Exercise duration (sec): 59 s
MPHR: 152 {beats}/min
Peak HR: 131 {beats}/min
Percent HR: 88 %
Percent of predicted max HR: 86 %
RPE: 17
Rest HR: 84 {beats}/min
Stage 1 DBP: 100 mmHg
Stage 1 Grade: 0 %
Stage 1 HR: 83 {beats}/min
Stage 1 SBP: 174 mmHg
Stage 1 Speed: 0 mph
Stage 2 Grade: 0 %
Stage 2 HR: 87 {beats}/min
Stage 2 Speed: 1 mph
Stage 3 Grade: 0 %
Stage 3 HR: 87 {beats}/min
Stage 3 Speed: 1 mph
Stage 4 DBP: 101 mmHg
Stage 4 Grade: 10 %
Stage 4 HR: 116 {beats}/min
Stage 4 SBP: 183 mmHg
Stage 4 Speed: 1.7 mph
Stage 5 Grade: 12 %
Stage 5 HR: 131 {beats}/min
Stage 5 Speed: 2.5 mph
Stage 6 DBP: 98 mmHg
Stage 6 Grade: 0 %
Stage 6 HR: 121 {beats}/min
Stage 6 SBP: 182 mmHg
Stage 6 Speed: 0 mph
Stage 7 DBP: 101 mmHg
Stage 7 Grade: 0 %
Stage 7 HR: 92 {beats}/min
Stage 7 SBP: 167 mmHg
Stage 7 Speed: 0 mph

## 2015-05-31 ENCOUNTER — Ambulatory Visit (INDEPENDENT_AMBULATORY_CARE_PROVIDER_SITE_OTHER): Payer: 59 | Admitting: Internal Medicine

## 2015-05-31 ENCOUNTER — Encounter: Payer: Self-pay | Admitting: Internal Medicine

## 2015-05-31 VITALS — BP 154/84 | HR 86 | Temp 98.4°F | Resp 18 | Ht 61.0 in | Wt 159.9 lb

## 2015-05-31 DIAGNOSIS — I208 Other forms of angina pectoris: Secondary | ICD-10-CM

## 2015-05-31 DIAGNOSIS — G44209 Tension-type headache, unspecified, not intractable: Secondary | ICD-10-CM | POA: Diagnosis not present

## 2015-05-31 DIAGNOSIS — I10 Essential (primary) hypertension: Secondary | ICD-10-CM

## 2015-05-31 MED ORDER — HYDROCHLOROTHIAZIDE 12.5 MG PO CAPS: 12.5000 mg | ORAL_CAPSULE | Freq: Every day | ORAL | Status: DC

## 2015-05-31 MED ORDER — IBUPROFEN 200 MG PO TABS: 400.0000 mg | ORAL_TABLET | Freq: Four times a day (QID) | ORAL | Status: DC | PRN

## 2015-05-31 NOTE — Assessment & Plan Note (Signed)
She continues to be hypertensive to 160s to 170s on amlodipine 10 mg daily, so I have added hydrochlorothiazide 12.5 mg daily. Will check her blood pressure and a BMP in 1 week and titrate up the HCTZ accordingly if need-be.

## 2015-05-31 NOTE — Progress Notes (Signed)
Patient ID: Debra Rodgers, female   DOB: [birth year], 70 y.o.   MRN: [id number] [facility] Subjective:   Patient ID: Debra Rodgers female   DOB: Jun 16, 1945 70 y.o.   MRN: [id number]  HPI: [name] is a 70 y.o. female with a essential hypertension, major depressive disorder, and tobacco abuse presenting for follow-up of stable angina and hypertension.  Stable angina: She had been having moderate substernal chest pressure on exertion, so I ordered an ECG exercise stress test at her last visit. That stress test did not show ischemic changes. Since then, she continues to have mild exertional angina, but she has not taken any nitroglycerin. I reassured her she is low-risk for having coronary blockages so we'll work on her high blood pressure and smoking.  Essential hypertension:  Her home blood pressures have ranged from 150-170/90-100. She is currently taking amlodipine 10 mg daily.  She does not snore at night, is not checking alcohol, and does not drinking caffeinated beverages.  I have reviewed her medications and she continues to smoke.  Review of Systems  Constitutional: Negative for fever, chills, weight loss and malaise/fatigue.  Respiratory: Negative for shortness of breath and wheezing.   Cardiovascular: Positive for chest pain. Negative for palpitations, orthopnea and leg swelling.  Neurological: Positive for headaches.  Psychiatric/Behavioral: Negative for depression. The patient is not nervous/anxious.     Objective:  Physical Exam: Filed Vitals:   05/31/15 1323  BP: 154/84  Pulse: 86  Temp: 98.4 F (36.9 C)  TempSrc: Oral  Resp: 18  Height: 5' 1"  (1.549 m)  Weight: 159 lb 14.4 oz (72.53 kg)  SpO2: 98%   General: resting in chair comfortably, appropriately conversational Cardiac: regular rate and rhythm, no rubs, murmurs or gallops Pulm: breathing well, clear to auscultation bilaterally Abd: bowel sounds normal, soft, nondistended,  non-tender Ext: warm and well perfused, without pedal edema   Assessment & Plan:  Case discussed with Dr. Evette Doffing  Stable angina [facility]) She has been having episodes of stable angina for the past few months. An ECG exercise stress test last week did not show ischemic changes, so I reassured her today we will focus on preventing any coronary artery disease.  Essential hypertension She continues to be hypertensive to 160s to 170s on amlodipine 10 mg daily, so I have added hydrochlorothiazide 12.5 mg daily. Will check her blood pressure and a BMP in 1 week and titrate up the HCTZ accordingly if need-be.  Tension headache She described a bandlike pressure headache that started this morning , and wanted to take some ibuprofen here in the clinic so we gave her some.   Medications Ordered Meds ordered this encounter  Medications  . hydrochlorothiazide (MICROZIDE) 12.5 MG capsule    Sig: Take 1 capsule (12.5 mg total) by mouth daily.    Dispense:  30 capsule    Refill:  3  . ibuprofen (ADVIL) 200 MG tablet    Sig: Take 2 tablets (400 mg total) by mouth every 6 (six) hours as needed.    Dispense:  30 tablet    Refill:  0   Follow Up: Return in about 4 weeks (around 06/28/2015).

## 2015-05-31 NOTE — Assessment & Plan Note (Addendum)
She has been having episodes of stable angina for the past few months. An ECG exercise stress test last week did not show ischemic changes, so I reassured her today we will focus on preventing any coronary artery disease.

## 2015-05-31 NOTE — Patient Instructions (Signed)
Debra Rodgers,  It was great to see you again today.  I'm very glad to hear the stress results did NOT show blockages in your heart arteries.  We have some work to do on your blood pressure so I've started you on "hydrochlorothiazide."  Take this daily and we'll re-check your blood pressure and bloodwork in 4 weeks.  Keep up the good work quitting smoking!  See you in 4 weeks, Dr. Melburn Hake

## 2015-05-31 NOTE — Addendum Note (Signed)
Addended by: Alinda Dooms A on: 05/31/2015 02:14 PM   Modules accepted: SmartSet

## 2015-05-31 NOTE — Assessment & Plan Note (Signed)
She described a bandlike pressure headache that started this morning , and wanted to take some ibuprofen here in the clinic so we gave her some.

## 2015-05-31 NOTE — Progress Notes (Signed)
Internal Medicine Clinic Attending  Case discussed with Dr. Melburn Hake at the time of the visit.  We reviewed the resident's history and exam and pertinent patient test results.  I agree with the assessment, diagnosis, and plan of care documented in the resident's note.

## 2015-06-06 ENCOUNTER — Ambulatory Visit: Payer: Self-pay | Admitting: Internal Medicine

## 2015-06-30 ENCOUNTER — Telehealth: Payer: Self-pay | Admitting: Internal Medicine

## 2015-06-30 NOTE — Telephone Encounter (Signed)
APPT. REMINDER CALL, LMTCB °

## 2015-07-03 ENCOUNTER — Encounter: Payer: Self-pay | Admitting: Internal Medicine

## 2015-07-03 ENCOUNTER — Ambulatory Visit (INDEPENDENT_AMBULATORY_CARE_PROVIDER_SITE_OTHER): Payer: 59 | Admitting: Internal Medicine

## 2015-07-03 VITALS — BP 134/86 | HR 81 | Temp 98.5°F | Ht 61.0 in | Wt 158.3 lb

## 2015-07-03 DIAGNOSIS — Z79899 Other long term (current) drug therapy: Secondary | ICD-10-CM | POA: Diagnosis not present

## 2015-07-03 DIAGNOSIS — F17200 Nicotine dependence, unspecified, uncomplicated: Secondary | ICD-10-CM | POA: Diagnosis not present

## 2015-07-03 DIAGNOSIS — E785 Hyperlipidemia, unspecified: Secondary | ICD-10-CM | POA: Diagnosis not present

## 2015-07-03 DIAGNOSIS — Z720 Tobacco use: Secondary | ICD-10-CM

## 2015-07-03 DIAGNOSIS — I10 Essential (primary) hypertension: Secondary | ICD-10-CM | POA: Diagnosis not present

## 2015-07-03 NOTE — Assessment & Plan Note (Signed)
She continues to smoke 0.5 packs per day and has not yet used the nicotine patches I prescribed last month. I explained that whenever she feels ready to quit, bupropion would be a good option to help her mood and also help her quit smoking. She said she was interested, would think about it, and call me if she wants a prescription called into the pharmacy.

## 2015-07-03 NOTE — Assessment & Plan Note (Addendum)
Her blood pressure is well controlled at 134/86 today on amlodipine 10 mg daily and hydrochlorothiazide 12.5 mg daily. We'll continue the current regimen; should she become hypertensive in the future, we have room to up-titrate the hydrochlorothiazide.

## 2015-07-03 NOTE — Patient Instructions (Signed)
Ms. Chesmore,  It was great to see you again today.  Your blood pressure is very well-controlled so we will keep you on your current medications.  If you ever want to quit smoking, please give [place] a call. I'm happy to prescribe you bupropion over the phone if you'd like.  Take care, and we'll see you back in 3 months, Dr. Melburn Hake

## 2015-07-03 NOTE — Progress Notes (Signed)
Patient ID: Naziah Portee, female   DOB: 1945/06/23, 70 y.o.   MRN: [id number] [facility] Subjective:   Patient ID: Mikaela Hilgeman female   DOB: [birth year] 70 y.o.   MRN: [id number]  HPI: Ms.Analei Childers is a 70 y.o. female with essential hypertension, tobacco use disorder, and major depressive disorder presenting to clinic for hypertension follow-up.  Hypertension: She has been taking amlodipine 10 mg daily and HCTZ 12.5 mg daily without any issues. Her home pressures have been in the 120-130s/70-80s range.  Tobacco abuse: She continues to smoke half a pack per day. She has not tried the nicotine patches yet. She feels she is slowly cutting back. I told her I thought Zyban would be a good option to help her quit and she said she would think about it.  She continues to smoke, per above, and I have reviewed her medications with her today.  Review of Systems  Constitutional: Negative for fever and weight loss.  Respiratory: Negative for shortness of breath.   Cardiovascular: Negative for chest pain and leg swelling.  Gastrointestinal: Positive for heartburn. Negative for abdominal pain.  Skin: Negative for rash.  Neurological: Positive for headaches.   Objective:  Physical Exam: Filed Vitals:   07/03/15 1326 07/03/15 1347  BP: 144/90 134/86  Pulse: 81   Temp: 98.5 F (36.9 C)   TempSrc: Oral   Height: 5' 1"  (1.549 m)   Weight: 158 lb 4.8 oz (71.804 kg)   SpO2: 98%    General: resting in chair comfortably with a flattened affect but conversational HEENT: arcus senilis, extra-ocular muscles intact Cardiac: regular rate and rhythm, no rubs, murmurs or gallops Pulm: breathing well, clear to auscultation bilaterally Ext: warm and well perfused, without pedal edema Skin: no rash, hair, or nail changes  Assessment & Plan:  Case discussed with Dr. Eppie Gibson  Essential hypertension Her blood pressure is well controlled at 134/86 today on amlodipine 10 mg daily  and hydrochlorothiazide 12.5 mg daily. We'll continue the current regimen; should she become hypertensive in the future, we have room to up-titrate the hydrochlorothiazide.  Hyperlipidemia Her hyperlipidemia is well-controlled on atorvastatin 92m daily; her last LDL was 83 last month.  Tobacco use She continues to smoke 0.5 packs per day and has not yet used the nicotine patches I prescribed last month. I explained that whenever she feels ready to quit, bupropion would be a good option to help her mood and also help her quit smoking. She said she was interested, would think about it, and call me if she wants a prescription called into the pharmacy.   Follow Up: Return in about 3 months (around 10/02/2015).

## 2015-07-03 NOTE — Assessment & Plan Note (Signed)
Her hyperlipidemia is well-controlled on atorvastatin 73m daily; her last LDL was 83 last month.

## 2015-07-04 ENCOUNTER — Other Ambulatory Visit: Payer: Self-pay

## 2015-07-04 DIAGNOSIS — Z1231 Encounter for screening mammogram for malignant neoplasm of breast: Secondary | ICD-10-CM

## 2015-07-04 NOTE — Progress Notes (Signed)
Case discussed with Dr. Melburn Hake at the time of the visit. We reviewed the resident's history and exam and pertinent patient test results. I agree with the assessment, diagnosis, and plan of care documented in the resident's note.

## 2015-07-19 ENCOUNTER — Telehealth: Payer: Self-pay

## 2015-07-19 DIAGNOSIS — Z720 Tobacco use: Secondary | ICD-10-CM

## 2015-07-19 NOTE — Telephone Encounter (Signed)
Dr Melburn Hake, pt calls and states she is ready to try to stop smoking, would like for you to send a script for the med that you discussed with her, to [facility]

## 2015-07-19 NOTE — Telephone Encounter (Signed)
Please call pt back.

## 2015-07-20 MED ORDER — BUPROPION HCL ER (XL) 150 MG PO TB24: ORAL_TABLET | ORAL | Status: DC

## 2015-07-20 NOTE — Telephone Encounter (Signed)
Thanks for letting me know, Bonnita Nasuti. I'm glad to hear it. I've sent in the bupropion for her.

## 2015-07-21 ENCOUNTER — Ambulatory Visit
Admission: RE | Admit: 2015-07-21 | Discharge: 2015-07-21 | Disposition: A | Discharge status: Home / Self Care | Payer: 59 | Source: Ambulatory Visit

## 2015-07-21 DIAGNOSIS — Z1231 Encounter for screening mammogram for malignant neoplasm of breast: Secondary | ICD-10-CM

## 2015-08-21 ENCOUNTER — Other Ambulatory Visit: Payer: Self-pay | Admitting: Internal Medicine

## 2015-09-12 ENCOUNTER — Encounter: Payer: Self-pay | Admitting: *Deleted

## 2015-09-12 ENCOUNTER — Other Ambulatory Visit: Payer: Self-pay | Admitting: Internal Medicine

## 2015-09-12 NOTE — Telephone Encounter (Signed)
This will be her third month supply of Bupropion for smoking cessation. Total treatment usually only lasts three months. Please schedule her an appt with me in July so I can evaluate her response.

## 2015-09-18 NOTE — Telephone Encounter (Signed)
Please schedule patient for appointment for follow-up on medication. Thanks!

## 2015-10-02 ENCOUNTER — Encounter: Payer: Self-pay | Admitting: Student in an Organized Health Care Education/Training Program

## 2015-10-02 ENCOUNTER — Ambulatory Visit (INDEPENDENT_AMBULATORY_CARE_PROVIDER_SITE_OTHER): Payer: 59 | Admitting: Student in an Organized Health Care Education/Training Program

## 2015-10-02 VITALS — BP 153/70 | HR 87 | Temp 98.3°F | Ht 61.0 in | Wt 156.9 lb

## 2015-10-02 DIAGNOSIS — E7439 Other disorders of intestinal carbohydrate absorption: Secondary | ICD-10-CM

## 2015-10-02 DIAGNOSIS — F339 Major depressive disorder, recurrent, unspecified: Secondary | ICD-10-CM

## 2015-10-02 DIAGNOSIS — R0600 Dyspnea, unspecified: Secondary | ICD-10-CM

## 2015-10-02 DIAGNOSIS — Z0000 Encounter for general adult medical examination without abnormal findings: Secondary | ICD-10-CM

## 2015-10-02 DIAGNOSIS — Z86010 Personal history of colonic polyps: Secondary | ICD-10-CM

## 2015-10-02 DIAGNOSIS — G4733 Obstructive sleep apnea (adult) (pediatric): Secondary | ICD-10-CM | POA: Diagnosis not present

## 2015-10-02 DIAGNOSIS — Z23 Encounter for immunization: Secondary | ICD-10-CM | POA: Diagnosis not present

## 2015-10-02 DIAGNOSIS — R0609 Other forms of dyspnea: Secondary | ICD-10-CM

## 2015-10-02 DIAGNOSIS — Z720 Tobacco use: Secondary | ICD-10-CM

## 2015-10-02 DIAGNOSIS — D126 Benign neoplasm of colon, unspecified: Secondary | ICD-10-CM

## 2015-10-02 DIAGNOSIS — R7302 Impaired glucose tolerance (oral): Secondary | ICD-10-CM

## 2015-10-02 DIAGNOSIS — Z9989 Dependence on other enabling machines and devices: Secondary | ICD-10-CM

## 2015-10-02 DIAGNOSIS — F17200 Nicotine dependence, unspecified, uncomplicated: Secondary | ICD-10-CM

## 2015-10-02 DIAGNOSIS — F330 Major depressive disorder, recurrent, mild: Secondary | ICD-10-CM

## 2015-10-02 DIAGNOSIS — I10 Essential (primary) hypertension: Secondary | ICD-10-CM

## 2015-10-02 MED ORDER — HYDROCHLOROTHIAZIDE 25 MG PO TABS: 25.0000 mg | ORAL_TABLET | Freq: Every day | ORAL | Status: DC

## 2015-10-02 MED ORDER — FLUOXETINE HCL 20 MG PO CAPS: 20.0000 mg | ORAL_CAPSULE | Freq: Every day | ORAL | Status: DC

## 2015-10-02 NOTE — Assessment & Plan Note (Signed)
Diagnosed with depression in 2016 and has done well on fluoxetine since that time. PHQ9 score of 12 today. She is interested in decreasing her fluoxetine dose which I think is appropriate in her relatively good control. Plan to decrease fluoxetine to 20 mg once daily. Follow-up in 2 months and repeat PHQ9 then.

## 2015-10-02 NOTE — Patient Instructions (Addendum)
1. Your blood pressure was elevated today. I have increased your hydrochlorthiazide to 3m once daily, and continue using amlodipine 10 mg daily.  2. We will check your blood work today.   3. Please follow up with [facility] for a repeat colonoscopy.   4. We can reduce your fluoxetine to 281mdaily  5. I have ordered lung function testing to evaluate if you have COPD. This may be the reason you are so short of breath with walking.

## 2015-10-02 NOTE — Assessment & Plan Note (Signed)
Diagnosed on sleep study in 2013, currently doing well with her CPAP machine and reports good compliance. Plan to continue CPAP and will be due for another sleep study by 2023.

## 2015-10-02 NOTE — Assessment & Plan Note (Signed)
Blood pressure elevated above goal today. Reports good compliance with current medications. Plan to increase HCTZ from 12.5-25 mg daily and continue amlodipine 10 mg daily. Check renal function today.

## 2015-10-02 NOTE — Assessment & Plan Note (Signed)
Patient reports stable dyspnea on exertion, can walk about 2 blocks before feeling short of breath. Exercise stress test completed this year was low risk for cardiac ischemia and she is able to exercise for a little under 5 minutes before discontinuing due to shortness of breath. I think she is at risk for having COPD given her tobacco use history. Plan is to get pulmonary function testing. Follow-up in 2 months and if she does have significant obstructive disease we can start her on Spiriva.

## 2015-10-02 NOTE — Assessment & Plan Note (Signed)
Last colonoscopy was 2010 and had 6 total polyps, 3 of them were greater than 10 mm, and all were either tubular or tubulovillous adenomas. Based on this I would think she would be due for repeat colonoscopy in 3 years, she certainly overdue now. Plan to repeat colonoscopy with [facility] gastroenterology.

## 2015-10-02 NOTE — Progress Notes (Signed)
   See Encounters tab for problem-based medical decision making  __________________________________________________________  HPI:  70 year old woman here for follow-up of hypertension. She reports good compliance with her antihypertensive medications. She does note that she does not like the taste of amlodipine, says it dissolves quickly in her mouth and leaves a very better flavor. Denies any lightheadedness on standing. Does not check her blood pressures at home. She continues to have some shortness of breath with exertion. She was able to walk into the clinic today from the parking deck and fell short of breath by the end of it. She uses her albuterol inhaler when she gets short of breath and says that the inhaler plus rest will help her breathing. No chest pain or tightness with exertion. She underwent a exercise stress test with cardiology which was low risk with no ST changes. She continues to smoke about one half pack per day. She is interested in quitting but not actively trying. No fevers or chills recently. Eating and drinking well. Lives with her husband and 5 small dogs.  __________________________________________________________  Problem List: Patient Active Problem List   Diagnosis Date Noted  . OSA (obstructive sleep apnea) 10/02/2015  . Glucose intolerance 10/02/2015  . Adenomatous colon polyp 10/02/2015  . Dyspnea 10/02/2015  . Tobacco use 04/17/2015  . Essential hypertension 04/17/2015  . Major depressive disorder, recurrent episode ([place]) 04/17/2015  . Health care maintenance 05/16/2014  . GERD 06/25/2007  . Hyperlipidemia 07/29/2006    Medications: Reconciled today in Epic __________________________________________________________  Physical Exam:  Vital Signs: Filed Vitals:   10/02/15 0901  BP: 153/70  Pulse: 87  Temp: 98.3 F (36.8 C)  TempSrc: Oral  Height: 5' 1"  (1.549 m)  Weight: 156 lb 14.4 oz (71.169 kg)  SpO2: 98%    Gen: Well appearing, NAD ENT:  OP clear without erythema or exudate.  Neck: 1-2 cm mobile, nontender submandibular nodes her glands, No thyromegaly or nodules, No JVD. CV: RRR, no murmurs Pulm: Normal effort, CTA throughout, no wheezing Ext: Warm, no edema, She has multiple Heberden nodes on her right DIP joints. Skin: No atypical appearing moles. No rashes

## 2015-10-02 NOTE — Assessment & Plan Note (Signed)
Patient with well-controlled hypertension and BMI is 30. Last A1c 5.7% over 1 year ago. Plan to recheck A1c today.

## 2015-10-03 ENCOUNTER — Encounter: Payer: Self-pay | Admitting: Student in an Organized Health Care Education/Training Program

## 2015-10-03 LAB — CBC
Hematocrit: 40 % (ref 34.0–46.6)
Hemoglobin: 13.3 g/dL (ref 11.1–15.9)
MCH: 31.2 pg (ref 26.6–33.0)
MCHC: 33.3 g/dL (ref 31.5–35.7)
MCV: 94 fL (ref 79–97)
Platelets: 255 10*3/uL (ref 150–379)
RBC: 4.26 x10E6/uL (ref 3.77–5.28)
RDW: 14.4 % (ref 12.3–15.4)
WBC: 4.9 10*3/uL (ref 3.4–10.8)

## 2015-10-03 LAB — BMP8+ANION GAP
Anion Gap: 18 mmol/L (ref 10.0–18.0)
BUN/Creatinine Ratio: 20 (ref 12–28)
BUN: 13 mg/dL (ref 8–27)
CO2: 24 mmol/L (ref 18–29)
Calcium: 9.6 mg/dL (ref 8.7–10.3)
Chloride: 98 mmol/L (ref 96–106)
Creatinine, Ser: 0.66 mg/dL (ref 0.57–1.00)
GFR calc Af Amer: 105 mL/min/{1.73_m2} (ref >59)
GFR calc non Af Amer: 91 mL/min/{1.73_m2} (ref >59)
Glucose: 89 mg/dL (ref 65–99)
Potassium: 3.6 mmol/L (ref 3.5–5.2)
Sodium: 140 mmol/L (ref 134–144)

## 2015-10-03 LAB — HEMOGLOBIN A1C
Est. average glucose Bld gHb Est-mCnc: 117 mg/dL
Hgb A1c MFr Bld: 5.7 % — ABNORMAL HIGH (ref 4.8–5.6)

## 2015-10-09 ENCOUNTER — Ambulatory Visit (HOSPITAL_COMMUNITY)
Admission: RE | Admit: 2015-10-09 | Discharge: 2015-10-09 | Disposition: A | Discharge status: Home / Self Care | Payer: 59 | Source: Ambulatory Visit | Attending: Student in an Organized Health Care Education/Training Program | Admitting: Student in an Organized Health Care Education/Training Program

## 2015-10-09 DIAGNOSIS — Z720 Tobacco use: Secondary | ICD-10-CM | POA: Diagnosis present

## 2015-10-09 DIAGNOSIS — J449 Chronic obstructive pulmonary disease, unspecified: Secondary | ICD-10-CM | POA: Diagnosis not present

## 2015-10-09 LAB — PULMONARY FUNCTION TEST
DL/VA % pred: 88 %
DL/VA: 3.87 ml/min/mmHg/L
DLCO unc % pred: 55 %
DLCO unc: 11.27 ml/min/mmHg
FEF 25-75 Post: 0.7 L/sec
FEF 25-75 Pre: 0.69 L/sec
FEF2575-%Change-Post: 1 %
FEF2575-%Pred-Post: 45 %
FEF2575-%Pred-Pre: 44 %
FEV1-%Change-Post: 1 %
FEV1-%Pred-Post: 63 %
FEV1-%Pred-Pre: 62 %
FEV1-Post: 1.02 L
FEV1-Pre: 1.01 L
FEV1FVC-%Change-Post: -2 %
FEV1FVC-%Pred-Pre: 92 %
FEV6-%Change-Post: 3 %
FEV6-%Pred-Post: 72 %
FEV6-%Pred-Pre: 70 %
FEV6-Post: 1.45 L
FEV6-Pre: 1.4 L
FEV6FVC-%Pred-Post: 104 %
FEV6FVC-%Pred-Pre: 104 %
FVC-%Change-Post: 3 %
FVC-%Pred-Post: 69 %
FVC-%Pred-Pre: 67 %
FVC-Post: 1.45 L
FVC-Pre: 1.4 L
Post FEV1/FVC ratio: 71 %
Post FEV6/FVC ratio: 100 %
Pre FEV1/FVC ratio: 72 %
Pre FEV6/FVC Ratio: 100 %
RV % pred: 93 %
RV: 1.88 L
TLC % pred: 78 %
TLC: 3.6 L

## 2015-10-09 MED ORDER — ALBUTEROL SULFATE (2.5 MG/3ML) 0.083% IN NEBU
2.5000 mg | INHALATION_SOLUTION | Freq: Once | RESPIRATORY_TRACT | Status: AC
  Administered 2015-10-09: 2.5 mg via RESPIRATORY_TRACT

## 2015-10-10 ENCOUNTER — Other Ambulatory Visit: Payer: Self-pay | Admitting: Internal Medicine

## 2015-10-11 ENCOUNTER — Telehealth: Payer: Self-pay | Admitting: Student in an Organized Health Care Education/Training Program

## 2015-10-11 NOTE — Telephone Encounter (Signed)
I called Debra Rodgers with the results of PFTs. They show mild obstructive disease with reduced diffusion capacity and moderate air trapping. 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 I am going to hold on inhaled tiotropium for now, but would start if her symptoms worsen. I told her that the best option is immediate tobacco cessation to preserve her lung function. She is going to follow up with me in clinic in the next few months. I offered NRT again. She will think about it, doesn't seem ready to quit yet.

## 2015-10-19 ENCOUNTER — Encounter: Payer: Self-pay | Admitting: Student in an Organized Health Care Education/Training Program

## 2015-11-06 ENCOUNTER — Telehealth: Payer: Self-pay | Admitting: *Deleted

## 2015-11-10 ENCOUNTER — Encounter: Payer: Self-pay | Admitting: *Deleted

## 2015-12-03 ENCOUNTER — Other Ambulatory Visit: Payer: Self-pay | Admitting: Internal Medicine

## 2016-03-08 ENCOUNTER — Other Ambulatory Visit: Payer: Self-pay | Admitting: Student in an Organized Health Care Education/Training Program

## 2016-04-01 ENCOUNTER — Ambulatory Visit (INDEPENDENT_AMBULATORY_CARE_PROVIDER_SITE_OTHER): Payer: 59 | Admitting: Internal Medicine

## 2016-04-01 ENCOUNTER — Encounter (INDEPENDENT_AMBULATORY_CARE_PROVIDER_SITE_OTHER): Payer: Self-pay

## 2016-04-01 VITALS — BP 136/78 | HR 95 | Temp 100.5°F | Ht 61.0 in | Wt 151.1 lb

## 2016-04-01 DIAGNOSIS — J101 Influenza due to other identified influenza virus with other respiratory manifestations: Secondary | ICD-10-CM

## 2016-04-01 LAB — INFLUENZA PANEL BY PCR (TYPE A & B)
Influenza A By PCR: POSITIVE — AB
Influenza B By PCR: NEGATIVE

## 2016-04-01 MED ORDER — OSELTAMIVIR PHOSPHATE 75 MG PO CAPS: 75.0000 mg | ORAL_CAPSULE | Freq: Two times a day (BID) | ORAL | 0 refills | Status: AC

## 2016-04-01 MED ORDER — DEXTROMETHORPHAN-GUAIFENESIN 5-100 MG/5ML PO LIQD: 10.0000 mL | Freq: Four times a day (QID) | ORAL | 0 refills | Status: DC | PRN

## 2016-04-01 NOTE — Patient Instructions (Signed)
Ms. Boyte,  I am checking you for flu. I will call you later this afternoon with the results. The treatment will mostly be symptomatic control.  You can alternate ibuprofen 800 mg and tylenol 1000 mg every three hours for the fever and chills. I am also sending in a cough syrup for you as well.  If you start having worsening fevers, shortness of breath or notice and blood in your sputum please call us. If you are not feeling any better by Friday, please call us.

## 2016-04-01 NOTE — Assessment & Plan Note (Addendum)
She says that on staruday evening she developed a sorethroat and headache as well as malaise. The following day she developed cough with clear sputum production.Reports subjective fevers and chills. No nausea/vomiting. Has a good appetite. Does note some rhinorrhea and sinus congestion. No ear pain, diarrhea. Got flu shot this year. No Sick contacts. No shortness of breath or chest pain.   Plan Flu PCR in clinic today positive for Influenza A Sent in Rx for Tamiflu 75 mg bid x 5 days Ibuprofen/Tylenol prn Dextromethorphan-Guaifenesin prn Given return precautions for possible post flu PNA

## 2016-04-01 NOTE — Progress Notes (Signed)
   CC: Fever/Chills  HPI:  [name] is a 71 y.o. female with a past medical history listed below here today with complaints of sore throat, fever/chills, body aches.  She says that on staruday evening she developed a sorethroat and headache as well as malaise. The following day she developed cough with clear sputum production.Reports subjective fevers and chills. No nausea/vomiting. Has a good appetite. Does note some rhinorrhea and sinus congestion. No ear pain, diarrhea. Got flu shot this year. No Sick contacts. No shortness of breath or chest pain.    Past Medical History:  Diagnosis Date  . Depression   . Essential hypertension   . History of migraine headaches   . Tobacco use disorder     Review of Systems:   Negative except as noted in HPI   Physical Exam:  Vitals:   04/01/16 1351  BP: 136/78  Pulse: 95  Temp: (!) 100.5 F (38.1 C)  TempSrc: Oral  SpO2: 99%  Weight: 151 lb 1.6 oz (68.5 kg)  Height: 5' 1"  (1.549 m)   Physical Exam  Constitutional: She is well-developed, well-nourished, and in no distress. No distress.  HENT:  Head: Normocephalic and atraumatic.  Nose: Nose normal.  Mouth/Throat: Oropharynx is clear and moist. No oropharyngeal exudate.  Cerumen obstructing view of TM bilaterally  Eyes: Conjunctivae and EOM are normal. Pupils are equal, round, and reactive to light.  Cardiovascular: Normal rate, regular rhythm and normal heart sounds.   Pulmonary/Chest: Effort normal and breath sounds normal. No respiratory distress. She has no wheezes. She has no rales.  Lymphadenopathy:    She has no cervical adenopathy.    Assessment & Plan:   See Encounters Tab for problem based charting.  Patient discussed with Dr. Angelia Mould

## 2016-04-02 ENCOUNTER — Other Ambulatory Visit: Payer: Self-pay

## 2016-04-02 NOTE — Progress Notes (Signed)
Internal Medicine Clinic Attending  Case discussed with Dr. Boswell at the time of the visit.  We reviewed the resident's history and exam and pertinent patient test results.  I agree with the assessment, diagnosis, and plan of care documented in the resident's note.  

## 2016-04-02 NOTE — Telephone Encounter (Signed)
Pt states antibiotic is not at the pharmacy. Please call pt back.

## 2016-04-09 NOTE — Telephone Encounter (Addendum)
Follow-up call made to patient-no answer, message left on recorder.  Per pharmacy- pt did not get filled at [place], but stated that some some rxs were transferred to other pharmacies, because the tamiflu was out of stock. Regenia Skeeter, Kaizer Dissinger Cassady1/16/201811:25 AM  Will await call back from pt to see how she is feeling.  Pt may no longer need this medication, if she hasnt taken it already-will discuss with MD..

## 2016-05-22 ENCOUNTER — Other Ambulatory Visit: Payer: Self-pay | Admitting: Student in an Organized Health Care Education/Training Program

## 2016-06-19 ENCOUNTER — Other Ambulatory Visit: Payer: Self-pay | Admitting: Internal Medicine

## 2016-06-19 DIAGNOSIS — I10 Essential (primary) hypertension: Secondary | ICD-10-CM

## 2016-08-27 ENCOUNTER — Other Ambulatory Visit: Payer: Self-pay | Admitting: Student in an Organized Health Care Education/Training Program

## 2016-08-27 DIAGNOSIS — Z1231 Encounter for screening mammogram for malignant neoplasm of breast: Secondary | ICD-10-CM

## 2016-09-03 ENCOUNTER — Other Ambulatory Visit: Payer: Self-pay | Admitting: Student in an Organized Health Care Education/Training Program

## 2016-09-10 ENCOUNTER — Ambulatory Visit
Admission: RE | Admit: 2016-09-10 | Discharge: 2016-09-10 | Disposition: A | Discharge status: Home / Self Care | Payer: 59 | Source: Ambulatory Visit | Attending: Student in an Organized Health Care Education/Training Program | Admitting: Student in an Organized Health Care Education/Training Program

## 2016-09-10 DIAGNOSIS — Z1231 Encounter for screening mammogram for malignant neoplasm of breast: Secondary | ICD-10-CM

## 2016-09-23 ENCOUNTER — Ambulatory Visit (INDEPENDENT_AMBULATORY_CARE_PROVIDER_SITE_OTHER): Payer: 59 | Admitting: Student in an Organized Health Care Education/Training Program

## 2016-09-23 VITALS — BP 150/96 | HR 62 | Temp 98.6°F | Ht 61.0 in | Wt 150.8 lb

## 2016-09-23 DIAGNOSIS — I10 Essential (primary) hypertension: Secondary | ICD-10-CM | POA: Diagnosis not present

## 2016-09-23 DIAGNOSIS — Z9989 Dependence on other enabling machines and devices: Secondary | ICD-10-CM

## 2016-09-23 DIAGNOSIS — D126 Benign neoplasm of colon, unspecified: Secondary | ICD-10-CM

## 2016-09-23 DIAGNOSIS — F17210 Nicotine dependence, cigarettes, uncomplicated: Secondary | ICD-10-CM | POA: Diagnosis not present

## 2016-09-23 DIAGNOSIS — Z79899 Other long term (current) drug therapy: Secondary | ICD-10-CM | POA: Diagnosis not present

## 2016-09-23 DIAGNOSIS — F330 Major depressive disorder, recurrent, mild: Secondary | ICD-10-CM

## 2016-09-23 DIAGNOSIS — G4733 Obstructive sleep apnea (adult) (pediatric): Secondary | ICD-10-CM

## 2016-09-23 DIAGNOSIS — R7302 Impaired glucose tolerance (oral): Secondary | ICD-10-CM

## 2016-09-23 DIAGNOSIS — Z86010 Personal history of colonic polyps: Secondary | ICD-10-CM | POA: Diagnosis not present

## 2016-09-23 DIAGNOSIS — J449 Chronic obstructive pulmonary disease, unspecified: Secondary | ICD-10-CM | POA: Insufficient documentation

## 2016-09-23 DIAGNOSIS — F339 Major depressive disorder, recurrent, unspecified: Secondary | ICD-10-CM | POA: Diagnosis not present

## 2016-09-23 DIAGNOSIS — K219 Gastro-esophageal reflux disease without esophagitis: Secondary | ICD-10-CM

## 2016-09-23 DIAGNOSIS — Z720 Tobacco use: Secondary | ICD-10-CM

## 2016-09-23 DIAGNOSIS — J439 Emphysema, unspecified: Secondary | ICD-10-CM

## 2016-09-23 MED ORDER — LISINOPRIL 10 MG PO TABS: 10.0000 mg | ORAL_TABLET | Freq: Every day | ORAL | 5 refills | Status: DC

## 2016-09-23 MED ORDER — CHLORTHALIDONE 50 MG PO TABS: 50.0000 mg | ORAL_TABLET | Freq: Every day | ORAL | 5 refills | Status: DC

## 2016-09-23 NOTE — Assessment & Plan Note (Signed)
0.5 PPD smoker for 50 years. Never tried to quit before, afraid of quitting. I spent 10 minutes counseling her on the importance of tobacco cessation and offering advice on nicotine replacement therapy and the option for pharmacotherapy. She is going to try patches and gum over the next few months. We will revisit at her next visit in 2 months.

## 2016-09-23 NOTE — Assessment & Plan Note (Signed)
Numerous tubular adenomas seen on colonoscopy in 2010. She was due for repeat colonoscopy in 2012, but this was deferred. I hadn't reorder the colonoscopy in 2017 however it turns out that she still owed medical charges to [place]. I encouraged her to work with their office to resolve this, as she likely has newly developed polyps that should be evaluated asap. We will follow up on her progress at next visit.

## 2016-09-23 NOTE — Assessment & Plan Note (Signed)
Doing well on nightly CPAP. I do wonder if her morning headaches might be related to a poor fit of her mask. She says that she has to reorder several supplies to try to improve the fit. She is given a work with her [facility], they may send me new orders if needed. Sleep study was done in 2013 so that should be fine.

## 2016-09-23 NOTE — Progress Notes (Signed)
Assessment and Plan:  See Encounters tab for problem-based medical decision making.   __________________________________________________________  HPI:  71 year old woman here for follow-up of hypertension. It's been about one year since I saw the patient's. She reports doing fairly well, though she is feeling more blue lately. 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 Says overall she feels somewhat sad. Reports good compliance with her medications without any side effects. A few minor acute complaints, she has a mild headache intermittently, sometimes wakes up with it in the morning. Lasts about 30 minutes. Relieved with ibuprofen. No nausea or vomiting. Mild photosensitivity. No history of migraines. Also endorses a sharp burning type chest pain that's also intermittent. Says it comes on when she gets agitated or excited. Sometimes it'll happen when she is walking or working. Often will happen while she is resting at home in the evening. She had a stress test with cardiology in 2017 which was normal. Continues to be a half-pack per day smoker for the last 50 years. Reports improved exertional capacity. Recently was started on albuterol as needed after primary function testing showed moderate obstructive airway disease in 2017. Reports using the albuterol only a few times per month. No other recent hospitalizations. She did have influenza in January despite taking the flu vaccine. No recent visits to emergency departments or urgent care. She reports good compliance with CPAP machine at night.  __________________________________________________________  Problem List: Patient Active Problem List   Diagnosis Date Noted  . COPD (chronic obstructive pulmonary disease) ([place]) 09/23/2016    Priority: High  . Essential hypertension 04/17/2015    Priority: High  . Major depressive disorder, recurrent episode ([place]) 04/17/2015   Priority: High  . Glucose intolerance 10/02/2015    Priority: Medium  . Adenomatous colon polyp 10/02/2015    Priority: Medium  . Tobacco use 04/17/2015    Priority: Medium  . GERD 06/25/2007    Priority: Medium  . OSA (obstructive sleep apnea) 10/02/2015    Priority: Low  . Health care maintenance 05/16/2014    Priority: Low  . Hyperlipidemia 07/29/2006    Priority: Low    Medications: Reconciled today in Epic __________________________________________________________  Physical Exam:  Vital Signs: Vitals:   09/23/16 0955 09/23/16 1001  BP: (!) 173/97 (!) 150/96  Pulse: 65 62  Temp: 98.6 F (37 C)   TempSrc: Oral   SpO2: 99%   Weight: 150 lb 12.8 oz (68.4 kg)   Height: 5' 1"  (1.549 m)     Gen: Well appearing, NAD Neck: No cervical LAD, prominent submandibular salivary glands, No thyromegaly or nodules, No JVD. CV: RRR, no murmurs Pulm: Normal effort, CTA throughout, no wheezing Ext: Warm, no edema, multiple heberden and bouchard nodes on bilateral hands, especially thumbs, consistent with OA Skin: No atypical appearing moles. No rashes

## 2016-09-23 NOTE — Assessment & Plan Note (Signed)
Blood pressure is elevated above goal today. Plan is to switch HCTZ 25 mg over to chlorthalidone 50 mg, and add lisinopril 10 mg once daily. We'll continue with amlodipine 10 mg daily. Follow-up in 2 months for a blood pressure recheck. Checking BMP today.

## 2016-09-23 NOTE — Assessment & Plan Note (Signed)
A1c consistently in the pre-diabetes range around 5.7%. Plan to recheck A1c today and annually.

## 2016-09-23 NOTE — Assessment & Plan Note (Signed)
Pulmonary function testing obtained in 2017 for dyspnea with exertion showed moderate obstructive airway disease, air trapping, and reduced diffusion capacity. 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 She doing fairly well on current albuterol as needed, only uses it a few times per month. We spent a lot of time talking about tobacco cessation today. May need to escalate therapy in the future to include Spiriva. She received Prevnar in 2017, we should give her a Pneumovax in the next visit.

## 2016-09-23 NOTE — Assessment & Plan Note (Signed)
PHQ9 score of 6 today. She has few acute stressors in her life including financial, death of her mother-in-law, stress at work. Plan is to continue with fluoxetine 20 mg once daily. Recheck PHQ9 at next visit as well.

## 2016-09-24 ENCOUNTER — Encounter: Payer: Self-pay | Admitting: Student in an Organized Health Care Education/Training Program

## 2016-09-24 LAB — BMP8+ANION GAP
Anion Gap: 16 mmol/L (ref 10.0–18.0)
BUN/Creatinine Ratio: 21 (ref 12–28)
BUN: 15 mg/dL (ref 8–27)
CO2: 29 mmol/L (ref 20–29)
Calcium: 9.5 mg/dL (ref 8.7–10.3)
Chloride: 95 mmol/L — ABNORMAL LOW (ref 96–106)
Creatinine, Ser: 0.7 mg/dL (ref 0.57–1.00)
GFR calc Af Amer: 102 mL/min/{1.73_m2} (ref >59)
GFR calc non Af Amer: 89 mL/min/{1.73_m2} (ref >59)
Glucose: 90 mg/dL (ref 65–99)
Potassium: 3.1 mmol/L — ABNORMAL LOW (ref 3.5–5.2)
Sodium: 140 mmol/L (ref 134–144)

## 2016-09-24 LAB — HEMOGLOBIN A1C
Est. average glucose Bld gHb Est-mCnc: 120 mg/dL
Hgb A1c MFr Bld: 5.8 % — ABNORMAL HIGH (ref 4.8–5.6)

## 2016-10-07 ENCOUNTER — Other Ambulatory Visit: Payer: Self-pay

## 2016-10-07 DIAGNOSIS — Z720 Tobacco use: Secondary | ICD-10-CM

## 2016-10-07 MED ORDER — NICOTINE 14 MG/24HR TD PT24: 14.0000 mg | MEDICATED_PATCH | Freq: Every day | TRANSDERMAL | 0 refills | Status: DC

## 2016-10-07 NOTE — Telephone Encounter (Signed)
Pt states she has not used any method to assist in over a year, she states dr Evette Doffing told her to call when she was ready.

## 2016-10-07 NOTE — Telephone Encounter (Signed)
Requesting nicotine patch to be filled.

## 2016-10-07 NOTE — Telephone Encounter (Signed)
NRT discussed this month with Debra Rodgers and noted pt would use patches and gum. No Rx given - OTC? What strength patches is she using?

## 2016-10-07 NOTE — Telephone Encounter (Signed)
This dose for 6 weeks.. Call before she runs out for lower dose / taper.

## 2016-11-08 ENCOUNTER — Other Ambulatory Visit: Payer: Self-pay | Admitting: Student in an Organized Health Care Education/Training Program

## 2016-12-02 ENCOUNTER — Ambulatory Visit (INDEPENDENT_AMBULATORY_CARE_PROVIDER_SITE_OTHER): Payer: 59 | Admitting: Student in an Organized Health Care Education/Training Program

## 2016-12-02 VITALS — BP 132/91 | HR 75 | Temp 98.6°F | Ht 61.0 in | Wt 148.4 lb

## 2016-12-02 DIAGNOSIS — Z23 Encounter for immunization: Secondary | ICD-10-CM | POA: Diagnosis not present

## 2016-12-02 DIAGNOSIS — F330 Major depressive disorder, recurrent, mild: Secondary | ICD-10-CM

## 2016-12-02 DIAGNOSIS — H8110 Benign paroxysmal vertigo, unspecified ear: Secondary | ICD-10-CM | POA: Diagnosis not present

## 2016-12-02 DIAGNOSIS — Z720 Tobacco use: Secondary | ICD-10-CM

## 2016-12-02 DIAGNOSIS — I10 Essential (primary) hypertension: Secondary | ICD-10-CM

## 2016-12-02 NOTE — Assessment & Plan Note (Signed)
Patient reports mild symptoms of depressed mood. Her stepmother recently passed she had a client passed away as well. PHQ9 score of 5 today. Doing well on fluoxetine 20 mg, which I'm planning on continuing. I don't think she needs to increase her dose right now.

## 2016-12-02 NOTE — Progress Notes (Signed)
   Assessment and Plan:  See Encounters tab for problem-based medical decision making.   __________________________________________________________  HPI:   71 year old woman here for follow-up of hypertension and acute complaint of dizziness. She reports for the last several weeks on almost a daily basis feeling a very sudden onset sensation of dizziness usually when she turns her head to the right. Dizziness lasts only a few seconds and then resolves. No nausea or vomiting. She returns to normal in between episodes. They're only mildly bothersome to her. She reports good compliance with her medications otherwise. 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 One of her clients just recently passed away and she says that her mood is a little depressed. Otherwise things are going well at home, lives with her husband and 5 dogs. She does some exercise, no chest pain or DOE. Eating and drinking normally.  __________________________________________________________  Problem List: Patient Active Problem List   Diagnosis Date Noted  . COPD (chronic obstructive pulmonary disease) ([place]) 09/23/2016    Priority: High  . Essential hypertension 04/17/2015    Priority: High  . Major depressive disorder, recurrent episode ([place]) 04/17/2015    Priority: High  . Glucose intolerance 10/02/2015    Priority: Medium  . Adenomatous colon polyp 10/02/2015    Priority: Medium  . Tobacco use 04/17/2015    Priority: Medium  . GERD 06/25/2007    Priority: Medium  . BPPV (benign paroxysmal positional vertigo) 12/02/2016    Priority: Low  . OSA (obstructive sleep apnea) 10/02/2015    Priority: Low  . Health care maintenance 05/16/2014    Priority: Low  . Hyperlipidemia 07/29/2006    Priority: Low    Medications: Reconciled today in Epic __________________________________________________________  Physical Exam:  Vital Signs: Vitals:   12/02/16 0931 12/02/16 0936 12/02/16 0945 12/02/16 0947  BP:  129/79 (!) 155/80 (!) 148/70 (!) 132/91  Pulse: 73 68 70 75  Temp: 98.6 F (37 C)     TempSrc: Oral     SpO2: 100%     Weight: 148 lb 6.4 oz (67.3 kg)     Height: 5' 1"  (1.549 m)       Gen: Well appearing, NAD CV: RRR, no murmurs Pulm: Normal effort, CTA throughout, no wheezing Abd: Soft, NT, ND, normal BS.  Ext: Warm, no edema, normal joints Skin: No atypical appearing moles. No rashes Neuro: Alert, conversational, normal strength in the upper and lower extremities, no focal deficits. Dix-Hallpike maneuvers were equivocal bilaterally.

## 2016-12-02 NOTE — Assessment & Plan Note (Signed)
Clinical course seems most consistent with BPPV. I tried Dix-Hallpike maneuvers with both left and right year down but was unable to replicate her symptoms. Did not try Epley maneuver. Her symptoms are very mild and fairly infrequent. I think if they return I asked her to let me know and we will refer her to vestibular rehabilitation. Do not think there is a role for meclizine here.

## 2016-12-02 NOTE — Patient Instructions (Signed)

## 2016-12-02 NOTE — Assessment & Plan Note (Signed)
Continues to be half pack per day smoker, but very interested in quitting. She just filled the nicotine patch prescription, planning to start tomorrow. I counseled her for 5 minutes on the importance of tobacco cessation and different techniques to improve success. She'll let me know if she's having trouble and we can talk about pharmacotherapy.

## 2016-12-02 NOTE — Assessment & Plan Note (Signed)
Blood pressure was at goal today, and historically has been difficult to control. She is reporting a couple readings at home that seem low, systolics in the 48P and 23G. I'm not sure if her cuff is accurate she has always been elevated here in clinic. I don't think her dizziness is related to her antihypertensives, rather I think that is vertigo. Plan is to continue with amlodipine 10 mg, chlorthalidone 50 mg, and lisinopril 10 mg.

## 2016-12-31 ENCOUNTER — Other Ambulatory Visit: Payer: Self-pay | Admitting: Internal Medicine

## 2016-12-31 DIAGNOSIS — Z720 Tobacco use: Secondary | ICD-10-CM

## 2017-01-10 ENCOUNTER — Ambulatory Visit (INDEPENDENT_AMBULATORY_CARE_PROVIDER_SITE_OTHER): Payer: 59 | Admitting: Internal Medicine

## 2017-01-10 ENCOUNTER — Telehealth: Payer: Self-pay

## 2017-01-10 ENCOUNTER — Ambulatory Visit (HOSPITAL_COMMUNITY)
Admission: RE | Admit: 2017-01-10 | Discharge: 2017-01-10 | Disposition: A | Discharge status: Home / Self Care | Payer: 59 | Source: Ambulatory Visit | Attending: Internal Medicine | Admitting: Internal Medicine

## 2017-01-10 ENCOUNTER — Encounter (INDEPENDENT_AMBULATORY_CARE_PROVIDER_SITE_OTHER): Payer: Self-pay

## 2017-01-10 DIAGNOSIS — R300 Dysuria: Secondary | ICD-10-CM

## 2017-01-10 DIAGNOSIS — R3129 Other microscopic hematuria: Secondary | ICD-10-CM | POA: Insufficient documentation

## 2017-01-10 DIAGNOSIS — K5730 Diverticulosis of large intestine without perforation or abscess without bleeding: Secondary | ICD-10-CM | POA: Insufficient documentation

## 2017-01-10 DIAGNOSIS — K7689 Other specified diseases of liver: Secondary | ICD-10-CM | POA: Insufficient documentation

## 2017-01-10 DIAGNOSIS — Z9049 Acquired absence of other specified parts of digestive tract: Secondary | ICD-10-CM | POA: Diagnosis not present

## 2017-01-10 DIAGNOSIS — R39198 Other difficulties with micturition: Secondary | ICD-10-CM

## 2017-01-10 DIAGNOSIS — I77811 Abdominal aortic ectasia: Secondary | ICD-10-CM | POA: Diagnosis not present

## 2017-01-10 DIAGNOSIS — R109 Unspecified abdominal pain: Secondary | ICD-10-CM

## 2017-01-10 DIAGNOSIS — N83202 Unspecified ovarian cyst, left side: Secondary | ICD-10-CM | POA: Diagnosis not present

## 2017-01-10 DIAGNOSIS — M5136 Other intervertebral disc degeneration, lumbar region: Secondary | ICD-10-CM | POA: Diagnosis not present

## 2017-01-10 DIAGNOSIS — I700 Atherosclerosis of aorta: Secondary | ICD-10-CM | POA: Insufficient documentation

## 2017-01-10 DIAGNOSIS — N132 Hydronephrosis with renal and ureteral calculous obstruction: Secondary | ICD-10-CM | POA: Insufficient documentation

## 2017-01-10 DIAGNOSIS — D72829 Elevated white blood cell count, unspecified: Secondary | ICD-10-CM | POA: Diagnosis not present

## 2017-01-10 DIAGNOSIS — R34 Anuria and oliguria: Secondary | ICD-10-CM | POA: Insufficient documentation

## 2017-01-10 DIAGNOSIS — N200 Calculus of kidney: Secondary | ICD-10-CM | POA: Insufficient documentation

## 2017-01-10 LAB — POCT URINALYSIS DIPSTICK
Bilirubin, UA: NEGATIVE
Glucose, UA: NEGATIVE
Ketones, UA: NEGATIVE
Nitrite, UA: NEGATIVE
Protein, UA: NEGATIVE
Spec Grav, UA: 1.02 (ref 1.010–1.025)
Urobilinogen, UA: 0.2 E.U./dL
pH, UA: 6.5 (ref 5.0–8.0)

## 2017-01-10 MED ORDER — TAMSULOSIN HCL 0.4 MG PO CAPS: 0.4000 mg | ORAL_CAPSULE | Freq: Every day | ORAL | 0 refills | Status: DC

## 2017-01-10 MED ORDER — OXYCODONE-ACETAMINOPHEN 5-325 MG PO TABS: 1.0000 | ORAL_TABLET | ORAL | 0 refills | Status: DC | PRN

## 2017-01-10 NOTE — Patient Instructions (Signed)
Ms. Zanetti,  It was a pleasure to see you today. I will call you with the results of your CT scan. I have sent a prescription for flomax to your pharmacy to help you urinate. Please take this daily. If your scan is negative for stones, I will call you and let you know to stop the medicine. If you have any questions or concerns, call our clinic at [phone] or after hours call [phone] and ask for the internal medicine resident on call. Thank you!  - Dr. Philipp Ovens

## 2017-01-10 NOTE — Telephone Encounter (Signed)
Faxed [facility] form @ [phone] on 01/09/2017.

## 2017-01-10 NOTE — Assessment & Plan Note (Addendum)
Patient is here today with complaint of right-sided flank pain and dysuria 5 days. Reports that she is having difficulty urinating. She denies gross hematuria. POC  urinalysis today revealed trace hematuria, negative nitrites and small leukocytes. On exam she has significant right sided CVA tenderness. In the setting of her flank pain, microscopic hematuria, and dysuria I'm concerned for nephrolithiasis. Will obtain CT renal stone study for further evaluation. -- F/u STAT CT renal stone study  -- Percocet q4h PRN for pain  -- Flomax 0.4 mg daily  -- Encouraged hydration   ADDENDUM: CT with 3 x 4 x 4 mm proximal to mid right ureteral obstructing stone causing mild right hydroureteronephrosis. Discussed with on call urology who agreed with current management and expect it will pass spontaneously. Urology will arrange outpatient follow up in 1-2 weeks. Called patient and updated her with results.

## 2017-01-10 NOTE — Progress Notes (Signed)
Internal Medicine Clinic Attending  I saw and evaluated the patient.  I personally confirmed the key portions of the history and exam documented by Dr. Philipp Ovens and I reviewed pertinent patient test results.  The assessment, diagnosis, and plan were formulated together and I agree with the documentation in the resident's note.

## 2017-01-10 NOTE — Progress Notes (Signed)
   CC: Flank pain, dysuria  HPI:  [name] is a 71 y.o. female with past medical history outlined below here with complaint of flank pain and dysuria.. For the details of today's visit, please refer to the assessment and plan.  Past Medical History:  Diagnosis Date  . Depression   . Essential hypertension   . History of migraine headaches   . Tobacco use disorder     Review of Systems  Genitourinary: Positive for dysuria and flank pain. Negative for hematuria.    Physical Exam:  Vitals:   01/10/17 1510  BP: 109/69  Pulse: 75  Temp: 98.5 F (36.9 C)  TempSrc: Oral  SpO2: 100%  Weight: 148 lb 1.6 oz (67.2 kg)  Height: 5' 1"  (1.549 m)    Constitutional: NAD, appears comfortable Cardiovascular: RRR, no murmurs, rubs, or gallops.  Pulmonary/Chest: CTAB, no wheezes, rales, or rhonchi.  Abdominal: Soft, non tender, non distended. +BS. Positive right sided CVA tenderness. Psychiatric: Normal mood and affect  Assessment & Plan:   See Encounters Tab for problem based charting.  Patient seen with Dr. Evette Doffing

## 2017-01-28 ENCOUNTER — Other Ambulatory Visit: Payer: Self-pay | Admitting: *Deleted

## 2017-01-28 MED ORDER — FLUOXETINE HCL 20 MG PO CAPS: 20.0000 mg | ORAL_CAPSULE | Freq: Every day | ORAL | 3 refills | Status: DC

## 2017-02-06 ENCOUNTER — Other Ambulatory Visit: Payer: Self-pay | Admitting: Internal Medicine

## 2017-02-06 DIAGNOSIS — R34 Anuria and oliguria: Secondary | ICD-10-CM

## 2017-02-10 ENCOUNTER — Other Ambulatory Visit: Payer: Self-pay | Admitting: Internal Medicine

## 2017-02-10 DIAGNOSIS — R34 Anuria and oliguria: Secondary | ICD-10-CM

## 2017-02-10 NOTE — Telephone Encounter (Signed)
Rx previously denied by pcp.Debra Hidden Cassady11/19/20184:09 PM

## 2017-03-01 ENCOUNTER — Encounter (HOSPITAL_COMMUNITY): Payer: Self-pay | Admitting: Emergency Medicine

## 2017-03-01 ENCOUNTER — Ambulatory Visit (HOSPITAL_COMMUNITY)
Admission: EM | Admit: 2017-03-01 | Discharge: 2017-03-01 | Disposition: A | Discharge status: Home / Self Care | Payer: 59

## 2017-03-01 DIAGNOSIS — S300XXA Contusion of lower back and pelvis, initial encounter: Secondary | ICD-10-CM

## 2017-03-01 DIAGNOSIS — R51 Headache: Secondary | ICD-10-CM | POA: Diagnosis not present

## 2017-03-01 DIAGNOSIS — M545 Low back pain, unspecified: Secondary | ICD-10-CM

## 2017-03-01 DIAGNOSIS — R110 Nausea: Secondary | ICD-10-CM
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
## 2017-03-01 NOTE — Discharge Instructions (Signed)
Ice to the sore areas for the first couple days then heat to the muscles. May take Tylenol for pain or your home pain medicine. Follow-up with your doctor as needed, for worsening may return.
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
## 2017-03-07 ENCOUNTER — Other Ambulatory Visit: Payer: Self-pay | Admitting: Student in an Organized Health Care Education/Training Program

## 2017-03-07 ENCOUNTER — Other Ambulatory Visit: Payer: Self-pay

## 2017-03-07 ENCOUNTER — Ambulatory Visit (INDEPENDENT_AMBULATORY_CARE_PROVIDER_SITE_OTHER): Payer: Self-pay | Admitting: Internal Medicine

## 2017-03-07 DIAGNOSIS — B9689 Other specified bacterial agents as the cause of diseases classified elsewhere: Secondary | ICD-10-CM | POA: Insufficient documentation

## 2017-03-07 DIAGNOSIS — F17210 Nicotine dependence, cigarettes, uncomplicated: Secondary | ICD-10-CM

## 2017-03-07 DIAGNOSIS — G8911 Acute pain due to trauma: Secondary | ICD-10-CM

## 2017-03-07 DIAGNOSIS — J019 Acute sinusitis, unspecified: Secondary | ICD-10-CM

## 2017-03-07 DIAGNOSIS — J0110 Acute frontal sinusitis, unspecified: Secondary | ICD-10-CM

## 2017-03-07 DIAGNOSIS — R51 Headache: Secondary | ICD-10-CM

## 2017-03-07 DIAGNOSIS — M25512 Pain in left shoulder: Secondary | ICD-10-CM

## 2017-03-07 DIAGNOSIS — M25511 Pain in right shoulder: Secondary | ICD-10-CM

## 2017-03-07 MED ORDER — CYCLOBENZAPRINE HCL 5 MG PO TABS: 5.0000 mg | ORAL_TABLET | Freq: Three times a day (TID) | ORAL | 0 refills | Status: DC | PRN

## 2017-03-07 MED ORDER — ONDANSETRON HCL 4 MG PO TABS: 4.0000 mg | ORAL_TABLET | Freq: Every day | ORAL | 0 refills | Status: DC | PRN

## 2017-03-07 NOTE — Assessment & Plan Note (Addendum)
5 days of cough productive of yellow sputum, rhinorrhea and sinus pressure, Denies fever, sore throat, neck soreness, wheezing or difficulties with breathing. Has been using her albuterol inhaler occasionally but has not needed it more often since the cough began. Has been using otc tussinex. Coughing has lead to her feeling nauseous at times but she has a good appetites and is keeping down food.  - Rx for zofran  - start sinus irrigation  - call in 1.5 weeks if symptoms continue

## 2017-03-07 NOTE — Telephone Encounter (Signed)
Pt has an appt in [facility] today.

## 2017-03-07 NOTE — Patient Instructions (Addendum)
FOLLOW-UP INSTRUCTIONS When: 2 weeks - 1 month, with your primary care doctor ( Dr. Evette Doffing )  For: General check up  What to bring: medication bottles    You have a viral infection of your sinuses and upper respiratory tract. Unfortunately, we do not have any good antibiotics to treat this infection. However, it should resolve on its own in about 10 days.  In the meantime, there are several good remedies that will treat your symptoms and hopefully help to make you more comfortable. Please look for one of the over-the-counter medications below that will help with your congestion, cough, and post-nasal drip. Additionally, it will be very important to use nasal irrigation and nasal steroid spray to clear you sinus congestion. I have provided a recipe and instructions below.     BUFFERED ISOTONIC SALINE NASAL IRRIGATION  The Benefits:  1. When you irrigate, the isotonic saline (salt water) acts as a solvent and washes the mucus crusts and other debris from your nose.  2. This decongests and improves the airflow into your nose. The sinus passages begin to open.   Over the counter:  I recommend a Squirt Bottle form of saline to provide an adequate volume of irrigation to clear your sinuses.   Home Recipe:  1. Choose a 1-quart glass jar that is thoroughly cleansed.  2. Fill with sterile or distilled water, or you can boil water from the tap.  3. Add 1 to 2 heaping teaspoons of "pickling/canning/sea" salt (NOT table salt as it contains a large number of additives). This salt is available at the grocery store in the food canning section.  4. Mix ingredients together and store at room temperature. Discard after one week. If you find this solution too strong, you may decrease the amount of salt added to 1 to 1  teaspoons. With children it is often best to start with a milder solution and advance slowly. Irrigate with 240 ml (8 oz) twice daily.  The Instructions:  You should plan  to irrigate your nose with buffered isotonic saline 2 times per day. Many people prefer to warm the solution slightly in the microwave - but be sure that the solution is NOT HOT. Stand over the sink (some do this in the shower) and squirt the solution into each side of your nose, keeping your mouth open. This allows you to spit the saltwater out of your mouth. It will not harm you if you swallow a little.  If you have been told to use a nasal steroid such as Flonase, Nasonex, or Nasacort, you should always use isortonic saline solution first, then use your nasal steroid product. The nasal steroid is much more effective when sprayed onto clean nasal membranes and the steroid medicine will reach deeper into the nose.  Most people experience a little burning sensation the first few times they use a isotonic saline solution, but this usually goes away within a few days.
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
## 2017-03-10 NOTE — Progress Notes (Signed)
Medicine attending: Medical history, presenting problems, physical findings, and medications, reviewed with resident physician Dr Nina Blum on the day of the patient visit and I concur with her evaluation and management plan. 

## 2017-03-21 ENCOUNTER — Ambulatory Visit (HOSPITAL_COMMUNITY)
Admission: RE | Admit: 2017-03-21 | Discharge: 2017-03-21 | Disposition: A | Discharge status: Home / Self Care | Payer: 59 | Source: Ambulatory Visit | Attending: Internal Medicine | Admitting: Internal Medicine

## 2017-03-21 ENCOUNTER — Encounter: Payer: Self-pay | Admitting: Internal Medicine

## 2017-03-21 ENCOUNTER — Ambulatory Visit: Payer: 59 | Admitting: Internal Medicine

## 2017-03-21 ENCOUNTER — Other Ambulatory Visit: Payer: Self-pay

## 2017-03-21 DIAGNOSIS — G44319 Acute post-traumatic headache, not intractable: Secondary | ICD-10-CM

## 2017-03-21 DIAGNOSIS — R0781 Pleurodynia: Secondary | ICD-10-CM | POA: Diagnosis present

## 2017-03-21 DIAGNOSIS — F0781 Postconcussional syndrome: Secondary | ICD-10-CM | POA: Diagnosis not present

## 2017-03-21 MED ORDER — CYCLOBENZAPRINE HCL 5 MG PO TABS: 5.0000 mg | ORAL_TABLET | Freq: Three times a day (TID) | ORAL | 0 refills | Status: DC | PRN
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
## 2017-03-21 NOTE — Patient Instructions (Addendum)
FOLLOW-UP INSTRUCTIONS When: in 2 weeks if needed For: headaches, rib pain What to bring: medications  I have ordered x-rays of your ribs to look for any fracture  I have refilled your flexeril to take for a short course  Try taking Aleve or other anti-inflammatories to help with pain    Post-Concussion Syndrome Post-concussion syndrome is the symptoms that can occur after a head injury. These symptoms can last from weeks to months. Follow these instructions at home:  Take medicines only as told by your doctor.  Do not take aspirin.  Sleep with your head raised to help with headaches.  Avoid activities that can cause another head injury. ? Do not play contact sports like football, hockey, soccer, or basketball. ? Do not do other risky activities like downhill skiing, martial arts, or horseback riding until your doctor says it is okay.  Keep all follow-up visits as told by your doctor. This is important. Contact a doctor if:  You have a harder time: ? Paying attention. ? Focusing. ? Remembering. ? Learning new information. ? Dealing with stress.  You need more time to complete tasks.  You are easily bothered (irritable).  You have more symptoms. Get help if you have any of these symptoms for more than two weeks after your injury:  Long-lasting (chronic) headaches.  Dizziness.  Trouble balancing.  Feeling sick to your stomach (nauseous).  Trouble with your vision.  Noise or light bothers you more.  Depression.  Mood swings.  Feeling worried (anxious).  Easily bothered.  Memory problems.  Trouble concentrating or paying attention.  Sleep problems.  Feeling tired all of the time.  Get help right away if:  You feel confused.  You feel very sleepy.  You are hard to wake up.  You feel sick to your stomach.  You keep throwing up (vomiting).  You feel like you are moving when you are not (vertigo).  Your eyes move back and forth very  quickly.  You start shaking (convulsing) or pass out (faint).  You have very bad headaches that do not get better with medicine.  You cannot use your arms or legs like normal.  One of the black centers of your eyes (pupils) is bigger than the other.  You have clear or bloody fluid coming from your nose or ears.  Your problems get worse, not better. This information is not intended to replace advice given to you by your health care provider. Make sure you discuss any questions you have with your health care provider. Document Released: 04/18/2004 Document Revised: 08/17/2015 Document Reviewed: 06/16/2013 Elsevier Interactive Patient Education  2018 [employer].

## 2017-03-27 NOTE — Progress Notes (Signed)
Internal Medicine Clinic Attending  Case discussed with Dr. Juleen China  at the time of the visit.  We reviewed the resident's history and exam and pertinent patient test results.  I agree with the assessment, diagnosis, and plan of care documented in the resident's note.  Oda Kilts, MD

## 2017-04-04 ENCOUNTER — Other Ambulatory Visit: Payer: Self-pay

## 2017-04-04 ENCOUNTER — Ambulatory Visit (INDEPENDENT_AMBULATORY_CARE_PROVIDER_SITE_OTHER): Payer: Self-pay | Admitting: Internal Medicine

## 2017-04-04 ENCOUNTER — Encounter: Payer: Self-pay | Admitting: Internal Medicine

## 2017-04-04 VITALS — BP 97/64 | HR 77 | Temp 97.9°F | Ht 62.0 in | Wt 151.6 lb

## 2017-04-04 DIAGNOSIS — H6120 Impacted cerumen, unspecified ear: Secondary | ICD-10-CM | POA: Insufficient documentation

## 2017-04-04 DIAGNOSIS — G44309 Post-traumatic headache, unspecified, not intractable: Secondary | ICD-10-CM

## 2017-04-04 DIAGNOSIS — Z720 Tobacco use: Secondary | ICD-10-CM

## 2017-04-04 DIAGNOSIS — F0781 Postconcussional syndrome: Secondary | ICD-10-CM

## 2017-04-04 DIAGNOSIS — H6121 Impacted cerumen, right ear: Secondary | ICD-10-CM

## 2017-04-04 NOTE — Assessment & Plan Note (Addendum)
R ear fullness and decreased hearing due to cerumen impaction with large amount of buildup obscuring TM on exam. Irrigation and manual removal with curette instrumentation was performed in the clinic with a large amount of cerumen material removed from the R canal. Hearing was improved as a result and pt was encouraged to avoid Q tip use or introducing other foreign objects to ear canal. Also instructed to use debrox ear drops three times a week for residual cerumen buildup.
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
## 2017-04-04 NOTE — Patient Instructions (Addendum)
Nice to meet you today Ms. Debra Rodgers.  For your headache, it may take a little more time for these to get completely better.  Be sure to only use Tylenol or Aleve when the headache gets really bad as we do not want your body to get used to them and have a headache because you are not getting the medicine.  Also avoid the stronger oxycodone pain medicine from leftover prescription as this could worsen the headache.  We washed your ear today as there is wax buildup that is causing that ear fullness and decreased hearing.  In the future, avoid using Q-tips--sticking anything bigger than your elbow in your ear is a rule of thumb. You can pick up some Debrox ear drops (or similar at the pharmacy) and use these three times a week to help clear the remaining wax

## 2017-04-05 NOTE — Assessment & Plan Note (Signed)
She continues to smoke 1/2 pack/day despite prolonged use of nicotine patches.  She reports continued cravings and inquires whether or not she is on an appropriate patch dose.  She is again counseled on the importance of quitting and was encouraged for her efforts up to this point with the patches.  However, given lack of progress with nicotine replacement therapy she may be less motivated or require pharmacotherapy as an adjunct.  We will reassess her efforts and motivation on PCP follow-up.

## 2017-04-07 NOTE — Progress Notes (Signed)
Internal Medicine Clinic Attending  I saw and evaluated the patient.  I personally confirmed the key portions of the history and exam documented by Dr. Harden and I reviewed pertinent patient test results.  The assessment, diagnosis, and plan were formulated together and I agree with the documentation in the resident's note.  

## 2017-04-17 ENCOUNTER — Other Ambulatory Visit: Payer: Self-pay | Admitting: Student in an Organized Health Care Education/Training Program

## 2017-04-17 DIAGNOSIS — Z720 Tobacco use: Secondary | ICD-10-CM

## 2017-04-17 MED ORDER — NICOTINE 14 MG/24HR TD PT24: 14.0000 mg | MEDICATED_PATCH | Freq: Every day | TRANSDERMAL | 0 refills | Status: DC

## 2017-04-17 NOTE — Telephone Encounter (Signed)
Patient walked in requesting refill on  [facility] 14 MG/24HR patch [id number] [phone](Phone) [phone](Fax)

## 2017-04-24 ENCOUNTER — Other Ambulatory Visit: Payer: Self-pay | Admitting: Student in an Organized Health Care Education/Training Program

## 2017-04-24 NOTE — Telephone Encounter (Signed)
Next appt scheduled  2/18 with PCP.

## 2017-05-12 ENCOUNTER — Encounter: Payer: Self-pay | Admitting: Student in an Organized Health Care Education/Training Program

## 2017-05-12 ENCOUNTER — Ambulatory Visit: Payer: 59 | Admitting: Student in an Organized Health Care Education/Training Program

## 2017-05-12 ENCOUNTER — Encounter (INDEPENDENT_AMBULATORY_CARE_PROVIDER_SITE_OTHER): Payer: Self-pay

## 2017-05-12 VITALS — BP 123/80 | HR 79 | Temp 98.6°F | Wt 149.0 lb

## 2017-05-12 DIAGNOSIS — F17210 Nicotine dependence, cigarettes, uncomplicated: Secondary | ICD-10-CM

## 2017-05-12 DIAGNOSIS — I10 Essential (primary) hypertension: Secondary | ICD-10-CM | POA: Diagnosis not present

## 2017-05-12 DIAGNOSIS — J449 Chronic obstructive pulmonary disease, unspecified: Secondary | ICD-10-CM

## 2017-05-12 DIAGNOSIS — R51 Headache: Secondary | ICD-10-CM

## 2017-05-12 DIAGNOSIS — Z720 Tobacco use: Secondary | ICD-10-CM

## 2017-05-12 DIAGNOSIS — F330 Major depressive disorder, recurrent, mild: Secondary | ICD-10-CM

## 2017-05-12 DIAGNOSIS — G44309 Post-traumatic headache, unspecified, not intractable: Secondary | ICD-10-CM

## 2017-05-12 NOTE — Assessment & Plan Note (Signed)
I spent 5 minutes talking to her about the importance of tobacco cessation.  She is doing well on nicotine replacement therapy, tolerating 14 mcg patch, but has yet to actually cut back on cigarette usage.  I advise she picks a quit day and just does it.  Patient understands, we set a goal at next follow-up visit in 6 months to have quit smoking.
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
## 2017-05-12 NOTE — Assessment & Plan Note (Signed)
Pressure is at goal today.  Plan to continue with amlodipine 10 mg, chlorthalidone 50 mg, and lisinopril 10 mg once daily.

## 2017-05-12 NOTE — Assessment & Plan Note (Signed)
PHQ 9 score of 12 today.  Plan to continue with fluoxetine 20 mg once daily.
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
## 2017-07-05 ENCOUNTER — Other Ambulatory Visit: Payer: Self-pay | Admitting: Student in an Organized Health Care Education/Training Program

## 2017-07-16 ENCOUNTER — Other Ambulatory Visit: Payer: Self-pay | Admitting: Student in an Organized Health Care Education/Training Program

## 2017-07-16 DIAGNOSIS — I10 Essential (primary) hypertension: Secondary | ICD-10-CM

## 2017-07-21 ENCOUNTER — Encounter: Payer: Self-pay | Admitting: Student in an Organized Health Care Education/Training Program

## 2017-07-21 ENCOUNTER — Ambulatory Visit: Payer: 59 | Admitting: Student in an Organized Health Care Education/Training Program

## 2017-07-21 ENCOUNTER — Encounter (INDEPENDENT_AMBULATORY_CARE_PROVIDER_SITE_OTHER): Payer: Self-pay

## 2017-07-21 VITALS — BP 118/72 | HR 74 | Temp 98.4°F | Wt 149.2 lb

## 2017-07-21 DIAGNOSIS — R51 Headache: Secondary | ICD-10-CM | POA: Diagnosis not present

## 2017-07-21 DIAGNOSIS — R011 Cardiac murmur, unspecified: Secondary | ICD-10-CM

## 2017-07-21 DIAGNOSIS — Z87828 Personal history of other (healed) physical injury and trauma: Secondary | ICD-10-CM | POA: Diagnosis not present

## 2017-07-21 DIAGNOSIS — Z79899 Other long term (current) drug therapy: Secondary | ICD-10-CM

## 2017-07-21 DIAGNOSIS — M5382 Other specified dorsopathies, cervical region: Secondary | ICD-10-CM

## 2017-07-21 DIAGNOSIS — Z720 Tobacco use: Secondary | ICD-10-CM

## 2017-07-21 DIAGNOSIS — F17210 Nicotine dependence, cigarettes, uncomplicated: Secondary | ICD-10-CM | POA: Diagnosis not present

## 2017-07-21 DIAGNOSIS — I10 Essential (primary) hypertension: Secondary | ICD-10-CM

## 2017-07-21 DIAGNOSIS — R519 Headache, unspecified: Secondary | ICD-10-CM

## 2017-07-21 NOTE — Assessment & Plan Note (Signed)
Blood pressure is at goal today.  Plan to continue lisinopril 10, amlodipine 10, and chlorthalidone 50 mg daily.  Check BMP today.
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
## 2017-07-21 NOTE — Patient Instructions (Signed)
Revealing some blood work today to work-up your headache.  I will call you tomorrow with the results.  Continue taking your other medications as prescribed.  Let me know if you need any refills.

## 2017-07-21 NOTE — Assessment & Plan Note (Signed)
Smoking about 10 cigarettes/day, trying to cut back.  Not ready to quit yet.  I spent 5 minutes counseling her on the importance of tobacco cessation.

## 2017-07-22 ENCOUNTER — Telehealth: Payer: Self-pay | Admitting: Student in an Organized Health Care Education/Training Program

## 2017-07-22 LAB — BMP8+ANION GAP
Anion Gap: 14 mmol/L (ref 10.0–18.0)
BUN/Creatinine Ratio: 27 (ref 12–28)
BUN: 26 mg/dL (ref 8–27)
CO2: 24 mmol/L (ref 20–29)
Calcium: 9.8 mg/dL (ref 8.7–10.3)
Chloride: 101 mmol/L (ref 96–106)
Creatinine, Ser: 0.95 mg/dL (ref 0.57–1.00)
GFR calc Af Amer: 70 mL/min/{1.73_m2} (ref >59)
GFR calc non Af Amer: 61 mL/min/{1.73_m2} (ref >59)
Glucose: 88 mg/dL (ref 65–99)
Potassium: 3.5 mmol/L (ref 3.5–5.2)
Sodium: 139 mmol/L (ref 134–144)

## 2017-07-22 LAB — SEDIMENTATION RATE: Sed Rate: 11 mm/hr (ref 0–40)

## 2017-07-22 LAB — C-REACTIVE PROTEIN: CRP: 0.5 mg/L (ref 0.0–4.9)

## 2017-07-22 MED ORDER — GABAPENTIN 100 MG PO CAPS: 100.0000 mg | ORAL_CAPSULE | Freq: Every day | ORAL | 1 refills | Status: DC

## 2017-07-22 NOTE — Telephone Encounter (Signed)
I spoke with Debra Rodgers today about normal ESR, which rules out GCA as the cause of her new persistent headaches. After hearing her story and examining her, I think these are most likely headaches referred from cervicalgia. There are no red flag symptoms, and she is otherwise doing well, so I do not think we need any imaging right now. Plan is to trial Gabapentin 188m qHS, can titrate up to 3066mqHS if she tolerates it well. Patient understands and had no questions.

## 2017-08-01 ENCOUNTER — Ambulatory Visit (INDEPENDENT_AMBULATORY_CARE_PROVIDER_SITE_OTHER): Payer: 59 | Admitting: Internal Medicine

## 2017-08-01 ENCOUNTER — Encounter: Payer: Self-pay | Admitting: Internal Medicine

## 2017-08-01 DIAGNOSIS — S31825A Open bite of left buttock, initial encounter: Secondary | ICD-10-CM | POA: Diagnosis not present

## 2017-08-01 DIAGNOSIS — W57XXXA Bitten or stung by nonvenomous insect and other nonvenomous arthropods, initial encounter: Secondary | ICD-10-CM

## 2017-08-01 DIAGNOSIS — Y93H2 Activity, gardening and landscaping: Secondary | ICD-10-CM | POA: Diagnosis not present

## 2017-08-01 NOTE — Patient Instructions (Signed)
Debra Rodgers,  It was a pleasure meeting you today.  Please inspect the area daily for a rash. If you develop symptoms such as nausea vomiting, fever/chills or muscle aches please call the clinic.

## 2017-08-01 NOTE — Progress Notes (Signed)
   CC: tick bite  HPI:  [name] is a 72 y.o. female with history noted below the presents to the acute care clinic for a tick bite.  Patient reports finding a tick on her buttocks while showering on 5/7. She states that she had no symptoms prior to discovering the tick and once she pulled it off lost it immediately and unable to inspect it. She states that she was gardening the Friday prior and that has been her only outdoor exposure. She reports slight pain to the area.  She denies fever/chills, headache, myalgias or rash.  She has not taken anything for her symptoms.  Past Medical History:  Diagnosis Date  . Depression   . Essential hypertension   . History of migraine headaches   . Tobacco use disorder     Review of Systems:  ROS As noted per HPI above   Physical Exam:  Vitals:   08/01/17 1524  BP: 117/71  Pulse: 72  Temp: 98.4 F (36.9 C)  TempSrc: Oral  SpO2: 99%  Weight: 145 lb 8 oz (66 kg)   Physical Exam  Constitutional: She is well-developed, well-nourished, and in no distress.  Skin:  Scab over left inner buttocks with hardening of area.  No rash noted.  No tenderness to palpation to the area. No signs of infection     Assessment & Plan:   See encounters tab for problem based medical decision making.   Patient discussed with Dr. Dareen Piano

## 2017-08-03 NOTE — Assessment & Plan Note (Signed)
Assessment: Potential tick bite Patient reports finding a tick on her left buttocks on 5/7.  On exam there is no rash or signs of infection. There is a scab with cystic appearance.  It is dry with no erythema. When palpated it was not tender.  At this time asked patient to continue to monitor the area for rash and if she develops symptoms such as nausea or vomiting, fever/chills or muscle aches to call the clinic. No need for prophylactic tick bite treatment.  Plan -monitor  - told patient to call if she developed symptoms or rash

## 2017-08-04 NOTE — Progress Notes (Signed)
Internal Medicine Clinic Attending  Case discussed with Dr. Hoffman at the time of the visit.  We reviewed the resident's history and exam and pertinent patient test results.  I agree with the assessment, diagnosis, and plan of care documented in the resident's note.  

## 2017-08-06 ENCOUNTER — Other Ambulatory Visit: Payer: Self-pay | Admitting: Student in an Organized Health Care Education/Training Program

## 2017-08-22 ENCOUNTER — Other Ambulatory Visit: Payer: Self-pay | Admitting: Student in an Organized Health Care Education/Training Program

## 2017-08-22 DIAGNOSIS — Z1231 Encounter for screening mammogram for malignant neoplasm of breast: Secondary | ICD-10-CM

## 2017-09-01 ENCOUNTER — Ambulatory Visit: Payer: Self-pay | Admitting: Student in an Organized Health Care Education/Training Program

## 2017-09-12 ENCOUNTER — Ambulatory Visit
Admission: RE | Admit: 2017-09-12 | Discharge: 2017-09-12 | Disposition: A | Discharge status: Home / Self Care | Payer: 59 | Source: Ambulatory Visit | Attending: Student in an Organized Health Care Education/Training Program | Admitting: Student in an Organized Health Care Education/Training Program

## 2017-09-12 ENCOUNTER — Ambulatory Visit: Payer: Self-pay

## 2017-09-12 DIAGNOSIS — Z1231 Encounter for screening mammogram for malignant neoplasm of breast: Secondary | ICD-10-CM

## 2017-10-10 ENCOUNTER — Other Ambulatory Visit: Payer: Self-pay | Admitting: Oncology

## 2017-10-13 ENCOUNTER — Encounter: Payer: Self-pay | Admitting: Student in an Organized Health Care Education/Training Program

## 2017-10-13 ENCOUNTER — Ambulatory Visit: Payer: 59 | Admitting: Student in an Organized Health Care Education/Training Program

## 2017-10-13 VITALS — BP 118/68 | HR 80 | Temp 98.7°F | Wt 144.5 lb

## 2017-10-13 DIAGNOSIS — Z5689 Other problems related to employment: Secondary | ICD-10-CM

## 2017-10-13 DIAGNOSIS — Z79899 Other long term (current) drug therapy: Secondary | ICD-10-CM

## 2017-10-13 DIAGNOSIS — F17210 Nicotine dependence, cigarettes, uncomplicated: Secondary | ICD-10-CM

## 2017-10-13 DIAGNOSIS — Z720 Tobacco use: Secondary | ICD-10-CM

## 2017-10-13 DIAGNOSIS — Z9989 Dependence on other enabling machines and devices: Secondary | ICD-10-CM

## 2017-10-13 DIAGNOSIS — M47812 Spondylosis without myelopathy or radiculopathy, cervical region: Secondary | ICD-10-CM

## 2017-10-13 DIAGNOSIS — F339 Major depressive disorder, recurrent, unspecified: Secondary | ICD-10-CM | POA: Diagnosis not present

## 2017-10-13 DIAGNOSIS — E785 Hyperlipidemia, unspecified: Secondary | ICD-10-CM | POA: Diagnosis not present

## 2017-10-13 DIAGNOSIS — R7302 Impaired glucose tolerance (oral): Secondary | ICD-10-CM | POA: Diagnosis not present

## 2017-10-13 DIAGNOSIS — M4722 Other spondylosis with radiculopathy, cervical region: Secondary | ICD-10-CM

## 2017-10-13 DIAGNOSIS — Z791 Long term (current) use of non-steroidal anti-inflammatories (NSAID): Secondary | ICD-10-CM

## 2017-10-13 DIAGNOSIS — F330 Major depressive disorder, recurrent, mild: Secondary | ICD-10-CM

## 2017-10-13 DIAGNOSIS — Z638 Other specified problems related to primary support group: Secondary | ICD-10-CM

## 2017-10-13 MED ORDER — GABAPENTIN 100 MG PO CAPS: 100.0000 mg | ORAL_CAPSULE | Freq: Two times a day (BID) | ORAL | 3 refills | Status: DC

## 2017-10-13 MED ORDER — FLUOXETINE HCL 40 MG PO CAPS: 40.0000 mg | ORAL_CAPSULE | Freq: Every day | ORAL | 3 refills | Status: DC

## 2017-10-13 NOTE — Assessment & Plan Note (Signed)
PHQ 9 score of 14 today.  A little worse control, several life stressors going on right now including work and family.  Plan to increase fluoxetine from 20 mg up to 40 mg daily.  Follow-up in 6 months.

## 2017-10-13 NOTE — Patient Instructions (Signed)
We talked about  your depression today which seems to be a little bit worse.please increase your fluoxetine to 40 mg once daily.  We talked about smoking today, you can combine the patch with the gum or lozenge to give you some extra control.  We talked about your neck pain which I think is causing some of your headache and arm pain.  You can increase her gabapentin to 100 mg twice a day, I doubt this low-dose is going to make you drowsy during the day but keep an eye on it for [place].

## 2017-10-13 NOTE — Assessment & Plan Note (Signed)
Last A1c a little elevated  at 5.8% 1 year ago. Plan to recheck  A1c today.

## 2017-10-13 NOTE — Progress Notes (Signed)
   Assessment and Plan:  See Encounters tab for problem-based medical decision making.   __________________________________________________________  HPI:   72 year old woman here for follow-up of depression and neck pain.  She reports having a few increased stressors at home recently.  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  She reports some increased family stress as well.  Increasing her smoking habit, now smoking almost a pack per day.  Says she is not eating very healthy diet right now it does not get exercise.  Not limited by chest pain or shortness of breath.  She still gets through full day of work without problems.  Having frequent headaches, several per week.  Uses Advil which helps.  Also uses gabapentin at night which helps with sleep.  Good compliance with CPAP machine.  No nocturnal headaches.  No unintentional weight loss.  No weakness.  No falls.  Also endorses some bilateral shoulder pain occasionally radiating down to her hands.  This is significantly function limiting.  Reports good compliance with her medications denies any other major recent illnesses.  Does not see any other subspecialist.  __________________________________________________________  Problem List: Patient Active Problem List   Diagnosis Date Noted  . COPD (chronic obstructive pulmonary disease) ([place]) 09/23/2016    Priority: High  . Essential hypertension 04/17/2015    Priority: High  . Major depressive disorder, recurrent episode ([place]) 04/17/2015    Priority: High  . Glucose intolerance 10/02/2015    Priority: Medium  . Adenomatous colon polyp 10/02/2015    Priority: Medium  . Tobacco use 04/17/2015    Priority: Medium  . Osteoarthritis cervical spine 05/26/2013    Priority: Medium  . GERD 06/25/2007    Priority: Medium  . BPPV (benign paroxysmal positional vertigo) 12/02/2016    Priority: Low  . OSA (obstructive sleep apnea)  10/02/2015    Priority: Low  . Health care maintenance 05/16/2014    Priority: Low  . Hyperlipidemia 07/29/2006    Priority: Low    Medications: Reconciled today in Epic __________________________________________________________  Physical Exam:  Vital Signs: Vitals:   10/13/17 0900  BP: 118/68  Pulse: 80  Temp: 98.7 F (37.1 C)  TempSrc: Oral  SpO2: 100%  Weight: 144 lb 8 oz (65.5 kg)    Gen: Well appearing, NAD Neck: No cervical LAD, No thyromegaly or nodules, No JVD. CV: RRR, no murmurs Pulm: Normal effort, CTA throughout, no wheezing Abd: Soft, NT, ND, 2 large scars midline are well-healed Ext: Warm, no edema, mild osteoarthritis of her right third DIP Skin: No atypical appearing moles. No rashes

## 2017-10-13 NOTE — Assessment & Plan Note (Signed)
Patient with associated neck pain, headache, and shoulder pain.  Clinically I think she most likely has osteoarthritis of her cervical spine causing all the symptoms.  No red flags and not function limiting.  Plan to increase gabapentin today from 100 daily up to bid dosing.  Warned her against NSAID medication overuse especially as it can worsen headache control.

## 2017-10-13 NOTE — Assessment & Plan Note (Signed)
Primary prevention of ischemic vascular disease, several risk factors including ongoing tobacco use.  Last lipids were in 2017 and reasonable, though LDL was not quite at goal given her high risk.  Plan to recheck lipids today, may increase dose of atorvastatin based on result.

## 2017-10-14 ENCOUNTER — Encounter: Payer: Self-pay | Admitting: Student in an Organized Health Care Education/Training Program

## 2017-10-14 LAB — LIPID PANEL
Chol/HDL Ratio: 2.7 ratio (ref 0.0–4.4)
Cholesterol, Total: 181 mg/dL (ref 100–199)
HDL: 67 mg/dL (ref >39)
LDL Calculated: 97 mg/dL (ref 0–99)
Triglycerides: 84 mg/dL (ref 0–149)
VLDL Cholesterol Cal: 17 mg/dL (ref 5–40)

## 2017-10-14 LAB — HEMOGLOBIN A1C
Est. average glucose Bld gHb Est-mCnc: 123 mg/dL
Hgb A1c MFr Bld: 5.9 % — ABNORMAL HIGH (ref 4.8–5.6)

## 2017-12-24 ENCOUNTER — Other Ambulatory Visit: Payer: Self-pay

## 2017-12-24 NOTE — Telephone Encounter (Signed)
Needs to speak with a nurse about FLUoxetine (PROZAC) 40 MG capsule. Please call pt back.

## 2017-12-24 NOTE — Telephone Encounter (Signed)
Called pt - no answer; left message to call the office if she still have questions.

## 2017-12-27 ENCOUNTER — Other Ambulatory Visit: Payer: Self-pay | Admitting: Student in an Organized Health Care Education/Training Program

## 2018-01-25 ENCOUNTER — Other Ambulatory Visit: Payer: Self-pay | Admitting: Student in an Organized Health Care Education/Training Program

## 2018-04-20 ENCOUNTER — Encounter: Payer: Self-pay | Admitting: *Deleted

## 2018-04-22 ENCOUNTER — Ambulatory Visit: Payer: 59 | Admitting: Internal Medicine

## 2018-04-22 ENCOUNTER — Other Ambulatory Visit: Payer: Self-pay

## 2018-04-22 VITALS — BP 114/72 | HR 75 | Temp 98.8°F | Ht 62.0 in | Wt 144.2 lb

## 2018-04-22 DIAGNOSIS — R51 Headache: Secondary | ICD-10-CM | POA: Diagnosis not present

## 2018-04-22 DIAGNOSIS — R0981 Nasal congestion: Secondary | ICD-10-CM

## 2018-04-22 DIAGNOSIS — J449 Chronic obstructive pulmonary disease, unspecified: Secondary | ICD-10-CM

## 2018-04-22 DIAGNOSIS — R21 Rash and other nonspecific skin eruption: Secondary | ICD-10-CM | POA: Diagnosis not present

## 2018-04-22 DIAGNOSIS — R05 Cough: Secondary | ICD-10-CM | POA: Diagnosis not present

## 2018-04-22 DIAGNOSIS — J0100 Acute maxillary sinusitis, unspecified: Secondary | ICD-10-CM

## 2018-04-22 DIAGNOSIS — Z79899 Other long term (current) drug therapy: Secondary | ICD-10-CM

## 2018-04-22 MED ORDER — AMOXICILLIN 875 MG PO TABS: 875.0000 mg | ORAL_TABLET | Freq: Two times a day (BID) | ORAL | 0 refills | Status: DC

## 2018-04-22 NOTE — Progress Notes (Signed)
   CC: Cough, rash that and facial pain  HPI:  Ms.Doneisha Lasure is a 73 y.o. female with past medical history listed below, presented to clinic due to cough with clear sputum, sore throat, facial pain since yesterday.  Please refer to problem based charting for further details and assessment and plan.  Past Medical History:  Diagnosis Date  . Depression   . Essential hypertension   . History of migraine headaches   . Tobacco use disorder    Review of Systems:  Review of Systems  Constitutional: Positive for chills. Negative for fever.  HENT: Positive for sinus pain and sore throat.   Respiratory: Positive for cough and sputum production. Negative for shortness of breath and wheezing.   Gastrointestinal: Negative for abdominal pain, diarrhea and vomiting.  Neurological: Positive for headaches.     Physical Exam:  Vitals:   04/22/18 1543  BP: 114/72  Pulse: 75  Temp: 98.8 F (37.1 C)  TempSrc: Oral  SpO2: 100%  Weight: 144 lb 3.2 oz (65.4 kg)  Height: 5' 2"  (1.575 m)   Physical Exam Constitutional:      General: She is not in acute distress.    Appearance: She is well-developed. She is ill-appearing.  HENT:     Head: Normocephalic and atraumatic.     Comments: Has tenderness on her maxillary sinuses, (also milder tenderness on frontal sinuses).  Erythema +/- white post nasal discharge on pharyngeal exam. No cervical lymphadenopathy and no cervical lymph nodes tenderness on exam.     Nose: Congestion present.  Eyes:     Pupils: Pupils are equal, round, and reactive to light.  Cardiovascular:     Rate and Rhythm: Normal rate and regular rhythm.     Heart sounds: Normal heart sounds. No murmur.  Pulmonary:     Effort: Pulmonary effort is normal.     Breath sounds: Normal breath sounds.     Comments:  Lung exam, with no wheeze.  Rhonchi on upper lungs.  Abdominal:     General: Bowel sounds are normal.     Palpations: Abdomen is soft.     Tenderness: There is no  abdominal tenderness.  Neurological:     General: No focal deficit present.     Mental Status: She is alert and oriented to person, place, and time.  Psychiatric:        Mood and Affect: Mood normal.        Behavior: Behavior normal.      Assessment & Plan:   See Encounters Tab for problem based charting.  Patient discussed with Dr. Eppie Gibson

## 2018-04-22 NOTE — Assessment & Plan Note (Signed)
Patient presented with facial pain, cough with clear sputum, headache and sore throat since yesterday.  Denies fever but had some chills.  Denies any shortness of breath. On exam: She is afebrile, has tenderness on her maxillary sinuses, (also milder tenderness on frontal sinuses).  Erythema +/- white post nasal discharge on pharyngeal exam. No cervical lymphadenopathy and no cervical lymph nodes tenderness on exam. Lung exam, with no wheeze.  Rhonchi on upper lungs.  She likely has acute maxilary sinusitis with tenderness.    Although only having 2 days Hx of symtoms, having tenderness on sinuses as well as Hx of COPD, I prefer to cover for possible bacterial sinusitis.  (No evidence of COPD exacerbation. And Centor criteria 0 for streptococcal pharyngitis.)  -amoxicillin 857 mg BID x 7 days -F/u in clinic in 7-10 days in no improvement

## 2018-04-22 NOTE — Progress Notes (Signed)
Case discussed with Dr. Myrtie Hawk at time of visit.  We reviewed the resident's history and exam and pertinent patient test results.  I agree with the assessment, diagnosis, and plan of care documented in the resident's note.  Dr. Myrtie Hawk believes the patient has a bacterial sinusitis and that, per her assessment, requires antibiotics.

## 2018-04-22 NOTE — Assessment & Plan Note (Signed)
Patient presented with cough with clear mucus, headache and facial pain yesterday. Denies any shortness of breath.  No wheezing on lung exam. Evidence of COPD exacerbation. -Continue albuterol inhaler as needed

## 2018-04-22 NOTE — Patient Instructions (Signed)
Thank you for allowing [place] to provide your care today.  Percent due to cough, chills headache and pressure pain on your sinuses.   Your symptoms can be due to acute sinusitis.  I prescribed you some antibiotic.  Please pick them up from the pharmacy and take them as instructed.  You can also take over-the-counter cold medication.  Recommend you to cut down smoking. Today I did not make any changes on rest of your medications.  Please take them as before  Please follow-up with [place] if no improvement after finishing your antibiotic.  Should you have any questions or concerns please call the internal medicine clinic at [phone].

## 2018-05-15 ENCOUNTER — Telehealth: Payer: Self-pay

## 2018-05-15 NOTE — Telephone Encounter (Signed)
Pt would like CPAP supplies order from [facility]. Please call pt back.

## 2018-05-18 NOTE — Telephone Encounter (Signed)
Debra Rodgers,  Let me know what CPAP supplies she needs. As far as I know this person is on CPAP already. Last sleep study was 2013, should be CPAP at pressure of 5. I last talked about this with the patient in 2018, so I may be missing something.

## 2018-06-01 NOTE — Telephone Encounter (Signed)
Returned call to patient. No answer. Left message on VM requesting return call. Hubbard Hartshorn, RN, BSN

## 2018-06-09 ENCOUNTER — Other Ambulatory Visit: Payer: Self-pay

## 2018-06-09 DIAGNOSIS — Z720 Tobacco use: Secondary | ICD-10-CM

## 2018-06-09 MED ORDER — NICOTINE 14 MG/24HR TD PT24: 14.0000 mg | MEDICATED_PATCH | Freq: Every day | TRANSDERMAL | 0 refills | Status: DC

## 2018-06-09 NOTE — Telephone Encounter (Signed)
nicotine ([facility]) 14 mg/24hr patch, refill request @  [id number] [place] [place] [phone](Phone) [phone](Fax)

## 2018-06-15 NOTE — Telephone Encounter (Signed)
Call made to [facility] to inquire about cpap supplies-they are unable to pull up pt's acct as she has not received anything from them in at least 2 yrs-she must call [phone] to initiate a request.  [place] will then reach out to [facility] with the appropriate paperwork.  Attempted to contact pt-no answer, contact info to [place] left on recorder.Regenia Skeeter, Kamryn Gauthier Cass[place]3/23/202011:25 AM

## 2018-07-01 ENCOUNTER — Other Ambulatory Visit: Payer: Self-pay | Admitting: Student in an Organized Health Care Education/Training Program

## 2018-07-01 DIAGNOSIS — I10 Essential (primary) hypertension: Secondary | ICD-10-CM

## 2018-07-01 NOTE — Telephone Encounter (Signed)
Done this AM already

## 2018-07-08 ENCOUNTER — Other Ambulatory Visit: Payer: Self-pay | Admitting: Student in an Organized Health Care Education/Training Program

## 2018-07-08 DIAGNOSIS — Z720 Tobacco use: Secondary | ICD-10-CM

## 2018-07-27 ENCOUNTER — Other Ambulatory Visit: Payer: Self-pay | Admitting: Student in an Organized Health Care Education/Training Program

## 2018-08-07 ENCOUNTER — Other Ambulatory Visit: Payer: Self-pay | Admitting: Student in an Organized Health Care Education/Training Program

## 2018-08-07 DIAGNOSIS — Z1231 Encounter for screening mammogram for malignant neoplasm of breast: Secondary | ICD-10-CM

## 2018-09-22 ENCOUNTER — Other Ambulatory Visit: Payer: Self-pay

## 2018-09-22 ENCOUNTER — Ambulatory Visit: Payer: 59 | Admitting: Internal Medicine

## 2018-09-22 VITALS — BP 118/70 | HR 75 | Temp 99.1°F | Ht 62.0 in | Wt 136.0 lb

## 2018-09-22 DIAGNOSIS — M545 Low back pain, unspecified: Secondary | ICD-10-CM

## 2018-09-22 LAB — POCT URINALYSIS DIPSTICK
Bilirubin, UA: NEGATIVE
Blood, UA: NEGATIVE
Glucose, UA: NEGATIVE
Ketones, UA: NEGATIVE
Nitrite, UA: NEGATIVE
Protein, UA: NEGATIVE
Spec Grav, UA: 1.025 (ref 1.010–1.025)
Urobilinogen, UA: 2 E.U./dL — AB
pH, UA: 6.5 (ref 5.0–8.0)

## 2018-09-22 NOTE — Patient Instructions (Addendum)
Debra Rodgers, it was great meeting you. Today we discussed your back pain which is mostly likely due to tight muscles in your low back and hip. I have included exercises I'd like you to try. I'd also recommend taking Ibuprofen 400 mg three times daily for 2 weeks. You can use ice or heat for 20 minutes at a time as well. Please call if your symptoms worsen or fail to improve over the next 2-3 weeks.   Take care! Dr. Koleen Distance   Low Back Sprain or Strain Rehab Ask your health care provider which exercises are safe for you. Do exercises exactly as told by your health care provider and adjust them as directed. It is normal to feel mild stretching, pulling, tightness, or discomfort as you do these exercises. Stop right away if you feel sudden pain or your pain gets worse. Do not begin these exercises until told by your health care provider. Stretching and range-of-motion exercises These exercises warm up your muscles and joints and improve the movement and flexibility of your back. These exercises also help to relieve pain, numbness, and tingling. Lumbar rotation  1. Lie on your back on a firm surface and bend your knees. 2. Straighten your arms out to your sides so each arm forms a 90-degree angle (right angle) with a side of your body. 3. Slowly move (rotate) both of your knees to one side of your body until you feel a stretch in your lower back (lumbar). Try not to let your shoulders lift off the floor. 4. Hold this position for 60 seconds. 5. Tense your abdominal muscles and slowly move your knees back to the starting position. 6. Repeat this exercise on the other side of your body. Repeat 3 times. Complete this exercise 2 times a day. Single knee to chest  1. Lie on your back on a firm surface with both legs straight. 2. Bend one of your knees. Use your hands to move your knee up toward your chest until you feel a gentle stretch in your lower back and buttock. ? Hold your leg in this position  by holding on to the front of your knee. ? Keep your other leg as straight as possible. 3. Hold this position for 60 seconds. 4. Slowly return to the starting position. 5. Repeat with your other leg. Repeat 3 times. Complete this exercise 2 times a day. Prone extension on elbows  1. Lie on your abdomen on a firm surface (prone position). 2. Prop yourself up on your elbows. 3. Use your arms to help lift your chest up until you feel a gentle stretch in your abdomen and your lower back. ? This will place some of your body weight on your elbows. If this is uncomfortable, try stacking pillows under your chest. ? Your hips should stay down, against the surface that you are lying on. Keep your hip and back muscles relaxed. 4. Hold this position for 60 seconds. 5. Slowly relax your upper body and return to the starting position. Repeat 3 times. Complete this exercise 2 times a day. Strengthening exercises These exercises build strength and endurance in your back. Endurance is the ability to use your muscles for a long time, even after they get tired. Pelvic tilt This exercise strengthens the muscles that lie deep in the abdomen. 1. Lie on your back on a firm surface. Bend your knees and keep your feet flat on the floor. 2. Tense your abdominal muscles. Tip your pelvis up toward the ceiling  and flatten your lower back into the floor. ? To help with this exercise, you may place a small towel under your lower back and try to push your back into the towel. 3. Hold this position for 60 seconds. 4. Let your muscles relax completely before you repeat this exercise. Repeat 3 times. Complete this exercise 2 times a day. Alternating arm and leg raises  1. Get on your hands and knees on a firm surface. If you are on a hard floor, you may want to use padding, such as an exercise mat, to cushion your knees. 2. Line up your arms and legs. Your hands should be directly below your shoulders, and your knees  should be directly below your hips. 3. Lift your left leg behind you. At the same time, raise your right arm and straighten it in front of you. ? Do not lift your leg higher than your hip. ? Do not lift your arm higher than your shoulder. ? Keep your abdominal and back muscles tight. ? Keep your hips facing the ground. ? Do not arch your back. ? Keep your balance carefully, and do not hold your breath. 4. Hold this position for 5 seconds. 5. Slowly return to the starting position. 6. Repeat with your right leg and your left arm. Repeat 5 times. Complete this exercise 2 times a day. Abdominal set with straight leg raise  1. Lie on your back on a firm surface. 2. Bend one of your knees and keep your other leg straight. 3. Tense your abdominal muscles and lift your straight leg up, 4-6 inches (10-15 cm) off the ground. 4. Keep your abdominal muscles tight and hold this position for 15 seconds. ? Do not hold your breath. ? Do not arch your back. Keep it flat against the ground. 5. Keep your abdominal muscles tense as you slowly lower your leg back to the starting position. 6. Repeat with your other leg. Repeat 2 times. Complete this exercise 2 times a day. Single leg lower with bent knees 1. Lie on your back on a firm surface. 2. Tense your abdominal muscles and lift your feet off the floor, one foot at a time, so your knees and hips are bent in 90-degree angles (right angles). ? Your knees should be over your hips and your lower legs should be parallel to the floor. 3. Keeping your abdominal muscles tense and your knee bent, slowly lower one of your legs so your toe touches the ground. 4. Lift your leg back up to return to the starting position. ? Do not hold your breath. ? Do not let your back arch. Keep your back flat against the ground. 5. Repeat with your other leg. Repeat 3 times. Complete this exercise 2 times a day. Posture and body mechanics Good posture and healthy body  mechanics can help to relieve stress in your body's tissues and joints. Body mechanics refers to the movements and positions of your body while you do your daily activities. Posture is part of body mechanics. Good posture means:  Your spine is in its natural S-curve position (neutral).  Your shoulders are pulled back slightly.  Your head is not tipped forward. Follow these guidelines to improve your posture and body mechanics in your everyday activities. Standing   When standing, keep your spine neutral and your feet about hip width apart. Keep a slight bend in your knees. Your ears, shoulders, and hips should line up.  When you do a task in which  you stand in one place for a long time, place one foot up on a stable object that is 2-4 inches (5-10 cm) high, such as a footstool. This helps keep your spine neutral. Sitting   When sitting, keep your spine neutral and keep your feet flat on the floor. Use a footrest, if necessary, and keep your thighs parallel to the floor. Avoid rounding your shoulders, and avoid tilting your head forward.  When working at a desk or a computer, keep your desk at a height where your hands are slightly lower than your elbows. Slide your chair under your desk so you are close enough to maintain good posture.  When working at a computer, place your monitor at a height where you are looking straight ahead and you do not have to tilt your head forward or downward to look at the screen. Resting  When lying down and resting, avoid positions that are most painful for you.  If you have pain with activities such as sitting, bending, stooping, or squatting, lie in a position in which your body does not bend very much. For example, avoid curling up on your side with your arms and knees near your chest (fetal position).  If you have pain with activities such as standing for a long time or reaching with your arms, lie with your spine in a neutral position and bend your  knees slightly. Try the following positions: ? Lying on your side with a pillow between your knees. ? Lying on your back with a pillow under your knees. Lifting   When lifting objects, keep your feet at least shoulder width apart and tighten your abdominal muscles.  Bend your knees and hips and keep your spine neutral. It is important to lift using the strength of your legs, not your back. Do not lock your knees straight out.  Always ask for help to lift heavy or awkward objects. This information is not intended to replace advice given to you by your health care provider. Make sure you discuss any questions you have with your health care provider. Document Released: 03/11/2005 Document Revised: 07/03/2018 Document Reviewed: 04/02/2018 Elsevier Patient Education  2020 [employer].

## 2018-09-24 NOTE — Progress Notes (Signed)
   CC: back pain   HPI:  [name] is a 73 y.o. female with PMHx listed below who presents for right sided low back pain.  Please see problem based charting for assessment and plan.   Past Medical History:  Diagnosis Date  . Depression   . Essential hypertension   . History of migraine headaches   . Tobacco use disorder    Review of Systems:  Review of Systems  Constitutional: Negative for chills and fever.  Genitourinary: Negative for dysuria, frequency and hematuria.  Musculoskeletal: Negative for falls.  Skin: Negative for rash.  Neurological: Negative for tingling, sensory change and focal weakness.    Physical Exam:  Vitals:   09/22/18 1447  BP: 118/70  Pulse: 75  Temp: 99.1 F (37.3 C)  TempSrc: Oral  SpO2: 99%  Weight: 136 lb (61.7 kg)  Height: 5' 2"  (1.575 m)   General: A&Ox3, appears stated age, NAD Skin: no ecchymoses or rash to low back  MSK: active ROM in tact, somewhat painful with left sidebending and extension. TTP over right lumbar paraspinal muscles. No midline tenderness or CVA tenderness.  Right hip with tenderness over ASIS and pain with internal rotation.  Neuro: Sensory and motor exam in BLE intact.   Assessment & Plan:   See Encounters Tab for problem based charting.  Patient discussed with Dr. Rebeca Alert

## 2018-09-27 ENCOUNTER — Encounter: Payer: Self-pay | Admitting: Internal Medicine

## 2018-09-27 DIAGNOSIS — M545 Low back pain, unspecified: Secondary | ICD-10-CM | POA: Insufficient documentation

## 2018-09-27 NOTE — Assessment & Plan Note (Addendum)
Presents with 2 weeks of right sided low black pain that is worse with movement. No falls or trauma. Denies fever, chills, numbness/tingilng, bowel or bladder incontinence, hematuria or dysuria. Pain is minimally function limiting. Has good ROM. Exam consistent with lumbar and iliopsoas strain. Will treat with NSAIDs, ice/heat. Also provided home exercises and stretches to do.

## 2018-09-29 ENCOUNTER — Ambulatory Visit
Admission: RE | Admit: 2018-09-29 | Discharge: 2018-09-29 | Disposition: A | Discharge status: Home / Self Care | Payer: 59 | Source: Ambulatory Visit | Attending: Student in an Organized Health Care Education/Training Program | Admitting: Student in an Organized Health Care Education/Training Program

## 2018-09-29 ENCOUNTER — Other Ambulatory Visit: Payer: Self-pay

## 2018-09-29 DIAGNOSIS — Z1231 Encounter for screening mammogram for malignant neoplasm of breast: Secondary | ICD-10-CM

## 2018-09-30 NOTE — Progress Notes (Signed)
Internal Medicine Clinic Attending  Case discussed with Dr. Bloomfield at the time of the visit.  We reviewed the resident's history and exam and pertinent patient test results.  I agree with the assessment, diagnosis, and plan of care documented in the resident's note.  Alexander Raines, M.D., Ph.D.  

## 2018-10-06 ENCOUNTER — Other Ambulatory Visit: Payer: Self-pay | Admitting: Student in an Organized Health Care Education/Training Program

## 2018-11-02 ENCOUNTER — Encounter: Payer: Self-pay | Admitting: Internal Medicine

## 2018-11-02 ENCOUNTER — Other Ambulatory Visit: Payer: Self-pay

## 2018-11-02 ENCOUNTER — Ambulatory Visit: Payer: 59 | Admitting: Internal Medicine

## 2018-11-02 DIAGNOSIS — R0781 Pleurodynia: Secondary | ICD-10-CM

## 2018-11-02 NOTE — Patient Instructions (Addendum)
Hi Ms. Much,  It was a pleasure to care of you in the clinic today.  Sorry to hear about the pain you are having at your ribs.  I would like to order some rib x-rays to make sure you do not have a fracture.  As we discussed, the last time a bone scan was done was 2016 and was normal.  Also, I would like for you to purchase the lidocaine patch at [facility] and continue alternating between the Tylenol and ibuprofen for about a week or so.  I hope this helps and improves your pain.  Take Care! Dr. Eileen Stanford  Please call the internal medicine center clinic if you have any questions or concerns, we may be able to help and keep you from a long and expensive emergency room wait. Our clinic and after hours phone number is [phone], the best time to call is Monday through Friday 9 am to 4 pm but there is always someone available 24/7 if you have an emergency. If you need medication refills please notify your pharmacy one week in advance and they will send [place] a request.

## 2018-11-02 NOTE — Assessment & Plan Note (Signed)
Mechanical fall: Debra Rodgers unfortunately tripped over her dog couple weeks ago and fell on the ground.  She denies syncope, head trauma or any inciting cardiac events prior to the episode.  She did not seek urgent medical attention and has tried Tylenol and ibuprofen without any relief.  Today she states the rib pain is worse on the right side and denies shortness of breath.  On physical exams today, she does not appear to be in any acute distress, lungs are clear to auscultation and her right lower ribs and left lower rib are somewhat tender to palpation.  Mild crepitus appreciated when the right lower rib is palpated.  No evidence of rib displacement  Plan: -Advised to purchase lidocaine patch -Advised to alternate short course of Tylenol with ibuprofen - Given return precautions -Advised on smoking cessation

## 2018-11-02 NOTE — Progress Notes (Signed)
   CC: Right rib pain  HPI:  [name] is a 73 y.o. very pleasant African-American woman with medical history listed below presenting to follow-up on right rib pain following a ground-level fall.  Please see problem based charting for further details.  Past Medical History:  Diagnosis Date  . Depression   . Essential hypertension   . History of migraine headaches   . Tobacco use disorder    Review of Systems: As per HPI  Physical Exam:  Vitals:   11/02/18 1535  BP: 126/67  Pulse: 69  Temp: 98.5 F (36.9 C)  TempSrc: Oral  SpO2: 99%  Weight: 130 lb 9.6 oz (59.2 kg)  Height: 5' 2.5" (1.588 m)   Physical Exam  Constitutional: She is well-developed, well-nourished, and in no distress. No distress.  HENT:  Head: Normocephalic and atraumatic.  Cardiovascular: Normal rate, regular rhythm and normal heart sounds.  Pulmonary/Chest: Effort normal and breath sounds normal. She has no wheezes.  Musculoskeletal:        General: Tenderness (Right and left lower ribs, no evidence of rib displacement, mild crepitus at the right lower rib) present.    Assessment & Plan:   See Encounters Tab for problem based charting.  Patient discussed with Dr. Lynnae January

## 2018-11-03 ENCOUNTER — Other Ambulatory Visit: Payer: Self-pay | Admitting: Internal Medicine

## 2018-11-03 DIAGNOSIS — R0781 Pleurodynia: Secondary | ICD-10-CM

## 2018-11-03 NOTE — Progress Notes (Signed)
Internal Medicine Clinic Attending  Case discussed with Dr. Eileen Stanford at the time of the visit.  We reviewed the resident's history and exam and pertinent patient test results.  I agree with the assessment, diagnosis, and plan of care documented in the resident's note.

## 2018-11-04 ENCOUNTER — Other Ambulatory Visit: Payer: Self-pay

## 2018-11-04 ENCOUNTER — Telehealth: Payer: Self-pay | Admitting: *Deleted

## 2018-11-04 ENCOUNTER — Emergency Department (HOSPITAL_COMMUNITY)
Admission: EM | Admit: 2018-11-04 | Discharge: 2018-11-05 | Disposition: A | Discharge status: Home / Self Care | Payer: 59 | Attending: Emergency Medicine | Admitting: Emergency Medicine

## 2018-11-04 DIAGNOSIS — I714 Abdominal aortic aneurysm, without rupture, unspecified: Secondary | ICD-10-CM

## 2018-11-04 DIAGNOSIS — Z79899 Other long term (current) drug therapy: Secondary | ICD-10-CM | POA: Diagnosis not present

## 2018-11-04 DIAGNOSIS — I10 Essential (primary) hypertension: Secondary | ICD-10-CM | POA: Insufficient documentation

## 2018-11-04 DIAGNOSIS — R1012 Left upper quadrant pain: Secondary | ICD-10-CM | POA: Diagnosis present

## 2018-11-04 DIAGNOSIS — C3431 Malignant neoplasm of lower lobe, right bronchus or lung: Secondary | ICD-10-CM

## 2018-11-04 DIAGNOSIS — F17210 Nicotine dependence, cigarettes, uncomplicated: Secondary | ICD-10-CM | POA: Diagnosis not present

## 2018-11-04 LAB — COMPREHENSIVE METABOLIC PANEL
ALT: 9 U/L (ref 0–44)
AST: 14 U/L — ABNORMAL LOW (ref 15–41)
Albumin: 3.7 g/dL (ref 3.5–5.0)
Alkaline Phosphatase: 71 U/L (ref 38–126)
Anion gap: 13 (ref 5–15)
BUN: 25 mg/dL — ABNORMAL HIGH (ref 8–23)
CO2: 25 mmol/L (ref 22–32)
Calcium: 10.6 mg/dL — ABNORMAL HIGH (ref 8.9–10.3)
Chloride: 98 mmol/L (ref 98–111)
Creatinine, Ser: 0.95 mg/dL (ref 0.44–1.00)
GFR calc Af Amer: >60 mL/min (ref >60)
GFR calc non Af Amer: >60 mL/min (ref >60)
Glucose, Bld: 107 mg/dL — ABNORMAL HIGH (ref 70–99)
Potassium: 3.3 mmol/L — ABNORMAL LOW (ref 3.5–5.1)
Sodium: 136 mmol/L (ref 135–145)
Total Bilirubin: 0.5 mg/dL (ref 0.3–1.2)
Total Protein: 7.5 g/dL (ref 6.5–8.1)

## 2018-11-04 LAB — CBC
HCT: 35.7 % — ABNORMAL LOW (ref 36.0–46.0)
Hemoglobin: 12 g/dL (ref 12.0–15.0)
MCH: 32.5 pg (ref 26.0–34.0)
MCHC: 33.6 g/dL (ref 30.0–36.0)
MCV: 96.7 fL (ref 80.0–100.0)
Platelets: 339 10*3/uL (ref 150–400)
RBC: 3.69 MIL/uL — ABNORMAL LOW (ref 3.87–5.11)
RDW: 12.7 % (ref 11.5–15.5)
WBC: 6.2 10*3/uL (ref 4.0–10.5)
nRBC: 0 % (ref 0.0–0.2)

## 2018-11-04 LAB — LIPASE, BLOOD: Lipase: 33 U/L (ref 11–51)

## 2018-11-04 MED ORDER — FENTANYL CITRATE (PF) 100 MCG/2ML IJ SOLN
50.0000 ug | Freq: Once | INTRAMUSCULAR | Status: AC
  Administered 2018-11-05: 50 ug via INTRAVENOUS
  Filled 2018-11-04: qty 2

## 2018-11-04 MED ORDER — SODIUM CHLORIDE 0.9% FLUSH
3.0000 mL | Freq: Once | INTRAVENOUS | Status: AC
  Administered 2018-11-05: 3 mL via INTRAVENOUS

## 2018-11-04 NOTE — Telephone Encounter (Signed)
Pt calls and states her pain is greater than 10, she doesn't like to complain but the suggested treatment is bad. She needs something more than otc lidocaine it goes from L to R, states she has never had pain like this before

## 2018-11-04 NOTE — ED Triage Notes (Signed)
Patient reports rib cage and upper L abdominal pain (tender to palpation)- saw doctor 2 days ago but states pain has worsened more. Denies cough, shortness of breath, chest pain, N/V/D.

## 2018-11-04 NOTE — Telephone Encounter (Signed)
Spoke with Debra Rodgers. Description of the pain sounds high risk. Would need urgent evaluation to rule out emergent causes of chest pain. Also I am very concerned about the history of two brothers taking ill suddenly and I am wondering what environmental exposure or infection is making them all ill at once. I would advise ED referral tonight.

## 2018-11-04 NOTE — Telephone Encounter (Signed)
Spoke w/pt expressed dr Evette Doffing and clinic staff's deepest sympathy for the loss of her brother and for her other brother's emergent situation. She seems stoic at the moment but anxious ask her to go to ED asap, she is agreeable but states she is speaking w/ the funeral home at the moment and when she finishes her husband will drive her to [place]. Encouraged her to call for anything that [facility] might be able to help with. She stated to tell dr Evette Doffing thank you

## 2018-11-05 ENCOUNTER — Emergency Department (HOSPITAL_COMMUNITY): Payer: 59

## 2018-11-05 ENCOUNTER — Encounter (HOSPITAL_COMMUNITY): Payer: Self-pay | Admitting: Radiology

## 2018-11-05 ENCOUNTER — Telehealth: Payer: Self-pay | Admitting: Student in an Organized Health Care Education/Training Program

## 2018-11-05 DIAGNOSIS — R911 Solitary pulmonary nodule: Secondary | ICD-10-CM

## 2018-11-05 DIAGNOSIS — R918 Other nonspecific abnormal finding of lung field: Secondary | ICD-10-CM | POA: Insufficient documentation

## 2018-11-05 DIAGNOSIS — I714 Abdominal aortic aneurysm, without rupture, unspecified: Secondary | ICD-10-CM | POA: Insufficient documentation

## 2018-11-05 LAB — URINALYSIS, ROUTINE W REFLEX MICROSCOPIC
Bilirubin Urine: NEGATIVE
Glucose, UA: NEGATIVE mg/dL
Hgb urine dipstick: NEGATIVE
Ketones, ur: NEGATIVE mg/dL
Leukocytes,Ua: NEGATIVE
Nitrite: NEGATIVE
Protein, ur: NEGATIVE mg/dL
Specific Gravity, Urine: 1.017 (ref 1.005–1.030)
pH: 5 (ref 5.0–8.0)

## 2018-11-05 MED ORDER — ACETAMINOPHEN 325 MG PO TABS: 650.0000 mg | ORAL_TABLET | Freq: Four times a day (QID) | ORAL | 0 refills | Status: DC | PRN

## 2018-11-05 MED ORDER — IOHEXOL 300 MG/ML  SOLN
100.0000 mL | Freq: Once | INTRAMUSCULAR | Status: AC | PRN
  Administered 2018-11-05: 100 mL via INTRAVENOUS

## 2018-11-05 MED ORDER — HYDROCODONE-ACETAMINOPHEN 5-325 MG PO TABS: 1.0000 | ORAL_TABLET | Freq: Three times a day (TID) | ORAL | 0 refills | Status: DC | PRN

## 2018-11-05 NOTE — Discharge Instructions (Addendum)
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 There is also an incidental finding of aortic aneurysm, but that is stable at this time. CT scan does not show any specific abnormalities over the left side of your abdomen.  It is prudent that you follow-up with your primary care doctor and the cancer team for further diagnostic work-up.  Please call the phone number provided for the earliest appointment.

## 2018-11-05 NOTE — Telephone Encounter (Signed)
I spoke with Debra Rodgers this morning. She went to the ED last night because the right sided rib pain has radiated to the left lower chest and upper abdomen. CT of the abdomen partially visualized a right middle lob mass abutting the pleura, which is like the cause of the recent right chest wall pain. 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 I am not sure the cause of the new left upper abdomen pain at this point.   The patient is now at home. She has a 3 day supply of hydrocodone that is helping with her pain. She was told that this is likely cancer, she has not yet told her family.   I am going to order a CT of the chest to better visualize the lung mass and look for adenopathy. I agree there is high risk of lung cancer given tobacco use history. I will refer to [place] when I have the CT results back.

## 2018-11-05 NOTE — Telephone Encounter (Signed)
Thanks for the update

## 2018-11-05 NOTE — ED Provider Notes (Signed)
[facility] EMERGENCY DEPARTMENT Provider Note   CSN: [id number] Arrival date & time: 11/04/18  1656    History   Chief Complaint Chief Complaint  Patient presents with   Abdominal Pain    HPI Debra Rodgers is a 73 y.o. female.     HPI  73 year old female comes in with chief complaint of abdominal pain. Patient reports that she started having left-sided abdominal and back pain about 7 to 10 days ago.  Over the last 2 days the pain became constant and more intense.  The pain is described as sharp pain, that is worse and better with certain positions.  No associated worsening with p.o. intake.  Patient denies any nausea, vomiting, fevers, chills, diarrhea.  She denies any new cough.  She has no history of kidney stones recently, and denies any UTI-like symptoms.  Patient does indicate that she had a fall 2 weeks ago onto her left side.  Past Medical History:  Diagnosis Date   Depression    Essential hypertension    History of migraine headaches    Tobacco use disorder     Patient Active Problem List   Diagnosis Date Noted   Rib pain on right side 11/02/2018   Acute right-sided low back pain without sciatica 09/27/2018   Acute bacterial sinusitis 03/07/2017   BPPV (benign paroxysmal positional vertigo) 12/02/2016   COPD (chronic obstructive pulmonary disease) ([place]) 09/23/2016   OSA (obstructive sleep apnea) 10/02/2015   Glucose intolerance 10/02/2015   Adenomatous colon polyp 10/02/2015   Tobacco use 04/17/2015   Essential hypertension 04/17/2015   Major depressive disorder, recurrent episode ([place]) 04/17/2015   Health care maintenance 05/16/2014   Osteoarthritis cervical spine 05/26/2013   GERD 06/25/2007   Hyperlipidemia 07/29/2006    Past Surgical History:  Procedure Laterality Date   ABDOMINAL HYSTERECTOMY     CHOLECYSTECTOMY     ROTATOR CUFF REPAIR  3/04     OB History   No obstetric history on file.      Home  Medications    Prior to Admission medications   Medication Sig Start Date End Date Taking? Authorizing Provider  acetaminophen (TYLENOL) 325 MG tablet Take 2 tablets (650 mg total) by mouth every 6 (six) hours as needed. 11/05/18   Varney Biles, MD  amLODipine (NORVASC) 10 MG tablet TAKE 1 TABLET BY MOUTH EVERY DAY 07/01/18   Annia Belt, MD  amoxicillin (AMOXIL) 875 MG tablet Take 1 tablet (875 mg total) by mouth 2 (two) times daily. 04/22/18   Masoudi, Dorthula Rue, MD  aspirin 81 MG EC tablet Take 81 mg by mouth daily.      [provider]  atorvastatin (LIPITOR) 20 MG tablet TAKE 1 TABLET BY MOUTH EVERY DAY 10/06/18   [name], MD  chlorthalidone (HYGROTON) 50 MG tablet TAKE 1 TABLET BY MOUTH EVERY DAY 07/27/18   Aldine Contes, MD  famotidine (PEPCID) 20 MG tablet Take 20 mg by mouth daily.     [provider]  FLUoxetine (PROZAC) 20 MG capsule TAKE 1 CAPSULE (20 MG TOTAL) DAILY BY MOUTH. 07/01/18   Annia Belt, MD  FLUoxetine (PROZAC) 40 MG capsule Take 1 capsule (40 mg total) by mouth daily. 10/13/17   [name], MD  gabapentin (NEURONTIN) 100 MG capsule Take 1 capsule (100 mg total) by mouth 2 (two) times daily. 10/13/17   [name], MD  gabapentin (NEURONTIN) 100 MG capsule TAKE 1 CAPSULE (100 MG TOTAL) BY MOUTH AT  BEDTIME. 12/29/17   [name], MD  HYDROcodone-acetaminophen (NORCO/VICODIN) 5-325 MG tablet Take 1 tablet by mouth every 8 (eight) hours as needed for up to 3 days for severe pain. 11/05/18 11/08/18  Varney Biles, MD  lisinopril (ZESTRIL) 10 MG tablet TAKE 1 TABLET BY MOUTH EVERY DAY 07/27/18   Aldine Contes, MD  nicotine ([facility] NICOTINE TRANSDERMAL SYS) 14 mg/24hr patch Place 1 patch (14 mg total) onto the skin daily. 06/09/18   [name], MD  VENTOLIN HFA 108 [phone] Base) MCG/ACT inhaler INHALE 1-2 PUFFS INTO THE LUNGS EVERY 6 (SIX) HOURS AS NEEDED FOR WHEEZING OR SHORTNESS OF BREATH.  08/07/17   [name], MD    Family History Family History  Problem Relation Age of Onset   Hypertension Mother    Stroke Mother    Coronary artery disease Mother    Heart disease Father    Diabetes Sister    Hypertension Sister    Cancer Sister    Breast cancer Sister     Social History Social History   Tobacco Use   Smoking status: Current Every Day Smoker    Packs/day: 0.50    Years: 35.00    Pack years: 17.50    Types: Cigarettes   Smokeless tobacco: Never Used   Tobacco comment: DOWN TO 1/2PACK PER DAY - Quit line info given  Substance Use Topics   Alcohol use: No    Alcohol/week: 0.0 standard drinks   Drug use: No     Allergies   Codeine sulfate and Pantoprazole sodium   Review of Systems Review of Systems  Constitutional: Positive for activity change.  Respiratory: Negative for cough.   Cardiovascular: Negative for chest pain.  Gastrointestinal: Positive for abdominal pain. Negative for anal bleeding.  Genitourinary: Positive for flank pain.     Physical Exam Updated Vital Signs BP 124/75    Pulse 72    Temp 98.5 F (36.9 C) (Oral)    Resp (!) 21    SpO2 96%   Physical Exam Vitals signs and nursing note reviewed.  Constitutional:      Appearance: She is well-developed.  HENT:     Head: Normocephalic and atraumatic.  Neck:     Musculoskeletal: Normal range of motion and neck supple.  Cardiovascular:     Rate and Rhythm: Normal rate.  Pulmonary:     Effort: Pulmonary effort is normal.  Abdominal:     General: Bowel sounds are normal.     Tenderness: There is abdominal tenderness in the epigastric area and left upper quadrant. There is left CVA tenderness and guarding. There is no rebound.  Skin:    General: Skin is warm and dry.  Neurological:     Mental Status: She is alert and oriented to person, place, and time.      ED Treatments / Results  Labs (all labs ordered are listed, but only abnormal results are  displayed) Labs Reviewed  COMPREHENSIVE METABOLIC PANEL - Abnormal; Notable for the following components:      Result Value   Potassium 3.3 (*)    Glucose, Bld 107 (*)    BUN 25 (*)    Calcium 10.6 (*)    AST 14 (*)    All other components within normal limits  CBC - Abnormal; Notable for the following components:   RBC 3.69 (*)    HCT 35.7 (*)    All other components within normal limits  LIPASE, BLOOD  URINALYSIS, ROUTINE W REFLEX  MICROSCOPIC    EKG None  Radiology Dg Ribs Unilateral W/chest Left  Result Date: 11/05/2018 CLINICAL DATA:  73 year old female status post fall with chest pain greater on the left. EXAM: LEFT RIBS AND CHEST - 3+ VIEW COMPARISON:  Chest radiographs 02/26/2013. left rib series 03/21/2017. FINDINGS: PA view of the chest demonstrates confluent abnormal right lung base opacity. There is stable chronic elevation of the left hemidiaphragm. Tortuous thoracic aorta. Other mediastinal contours are within normal limits. Visualized tracheal air column is within normal limits. No pneumothorax or pulmonary edema. Questionable small right pleural effusion. No left rib fracture identified. Other visible osseous structures appear intact. Prior lower cervical ACDF. Negative visible bowel gas pattern. IMPRESSION: 1. Confluent abnormal opacity at the right lung base, nonspecific, and with possible associated small right pleural effusion. If there are no signs/symptoms of infection then recommend Chest CT (IV contrast preferred) to further characterize. If pneumonia is suspected then Followup PA and lateral chest X-ray is recommended in 3-4 weeks following trial of antibiotic therapy. 2. No left rib fracture or other acute cardiopulmonary abnormality. Electronically Signed   By: Genevie Ann M.D.   On: 11/05/2018 00:18   Ct Abdomen Pelvis W Contrast  Result Date: 11/05/2018 CLINICAL DATA:  73 year old female with abdominal distention and epigastric and flank pain. Patient fell 2 weeks  ago. EXAM: CT ABDOMEN AND PELVIS WITH CONTRAST TECHNIQUE: Multidetector CT imaging of the abdomen and pelvis was performed using the standard protocol following bolus administration of intravenous contrast. CONTRAST:  120m OMNIPAQUE IOHEXOL 300 MG/ML  SOLN COMPARISON:  CT of the abdomen pelvis dated 02/18/2017 and left rib radiograph dated 11/05/2018 FINDINGS: Lower chest: Partially visualized 3.3 x 2.5 cm pleural based mass in the right middle lobe. Several additional smaller nodules noted in the right lung base. There is thickening and nodularity of the right lower pleural surface with multiple pleural implants/masses consistent with metastatic disease. There is a small right pleural effusion. Dedicated chest CT with contrast is recommended for evaluation of the lungs. There is no intra-abdominal free air or free fluid. Hepatobiliary: Several subcentimeter hepatic hypodense foci measure up to 9 mm. These lesions are too small to characterize but appears similar to prior CT and not appear to demonstrate enhancement, likely benign etiology such as cyst or hemangiomata. There is mild intrahepatic biliary ductal dilatation. The gallbladder is not visualized, likely surgically absent. No retained calcified stone noted in the central CBD. Pancreas: Unremarkable. No pancreatic ductal dilatation or surrounding inflammatory changes. Spleen: Normal in size without focal abnormality. Adrenals/Urinary Tract: The adrenal glands are unremarkable. There is no hydronephrosis on either side. There is symmetric enhancement and excretion of contrast by both kidneys. The urinary bladder is partially distended and grossly unremarkable. Stomach/Bowel: There is sigmoid diverticulosis without active inflammatory changes. There is no bowel obstruction or active inflammation. The appendix is not visualized with certainty. No inflammatory changes identified in the right lower quadrant. Vascular/Lymphatic: There is moderate aortoiliac  atherosclerotic disease. 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 No aortic dissection. No periaortic inflammation or contrast extravasation. The origins of the celiac axis, SMA, IMA and the renal arteries appear patent. The SMV, splenic vein, and main portal vein are patent. No portal venous gas. There is no adenopathy. Reproductive: Hysterectomy. There is a 6.3 cm left adnexal cyst. Further evaluation with pelvic ultrasound on a nonemergent basis recommended. Other: Midline vertical anterior pelvic wall incisional scar. No fluid collection. Musculoskeletal: Multilevel degenerative changes of the spine with  disc desiccation and vacuum phenomena. No acute osseous pathology. No suspicious osseous lesions. IMPRESSION: 1. No acute intra-abdominal or pelvic pathology. 2. Partially visualized right middle lobe pleural-based mass with multiple additional smaller pulmonary nodules consistent with metastatic disease. Diffuse thickening of the right pleural with pleural-based nodules/masses consistent with metastatic implant. A small right pleural effusion is also noted. Dedicated chest CT with contrast is recommended for evaluation of the lungs. 3. 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 Recommend followup by ultrasound in 2 years. This recommendation follows ACR consensus guidelines: White Paper of the ACR Incidental Findings Committee II on Vascular Findings. J Am Coll Radiol 2013; 10:789-794. Aortic aneurysm NOS (ICD10-I71.9) 4. Sigmoid diverticulosis. No bowel obstruction or active inflammation. 5. A 6.3 cm left adnexal cyst. Further evaluation with pelvic ultrasound on a nonemergent basis recommended. 6. Aortic Atherosclerosis (ICD10-I70.0). Electronically Signed   By: Anner Crete M.D.   On: 11/05/2018 01:16    Procedures Procedures (including critical care time)  Medications Ordered in ED Medications  sodium chloride flush (NS) 0.9 % injection 3 mL (3 mLs  Intravenous Given 11/05/18 0019)  fentaNYL (SUBLIMAZE) injection 50 mcg (50 mcg Intravenous Given 11/05/18 0020)  iohexol (OMNIPAQUE) 300 MG/ML solution 100 mL (100 mLs Intravenous Contrast Given 11/05/18 0043)     Initial Impression / Assessment and Plan / ED Course  I have reviewed the triage vital signs and the nursing notes.  Pertinent labs & imaging results that were available during my care of the patient were reviewed by me and considered in my medical decision making (see chart for details).  Clinical Course as of Nov 04 149  Thu Nov 05, 2018  0150 Results from the ER workup discussed with the patient face to face and all questions answered to the best of my ability. She took the news of likely cancer in stride. She is a smoker. She will f/u with pcp and cancer team. The patient is safe for discharge with strict return precautions.    CT ABDOMEN PELVIS W CONTRAST [AN]    Clinical Course User Index [AN] Varney Biles, MD       73 year old female comes in a chief complaint of abdominal pain.  She has been having pain for the last few days now, it has gotten constant and more intense over the last 2 or 3 days.  On exam she is noted to have left upper quadrant and epigastric tenderness.  She also has left flank tenderness.  No splenomegaly appreciated.  Patient denies any new cough and the lung exam is clear.  She did have a fall few days ago onto her left side, therefore the plan would include getting x-rays of her left ribs, CT scan abdomen and pelvis with contrast.  Goal is to rule out splenic infarcts, renal infarct, diverticulitis, lower rib fx or pneumonia.  Smoking cessation instruction/counseling given:  counseled patient on the dangers of tobacco use, advised patient to stop smoking, and reviewed strategies to maximize success. Discussed for 3-5 min.   Final Clinical Impressions(s) / ED Diagnoses   Final diagnoses:  Malignant neoplasm of lower lobe of right lung  ([place])  AAA (abdominal aortic aneurysm) without rupture [facility])    ED Discharge Orders         Ordered    HYDROcodone-acetaminophen (NORCO/VICODIN) 5-325 MG tablet  Every 8 hours PRN     11/05/18 0149    acetaminophen (TYLENOL) 325 MG tablet  Every 6 hours PRN     11/05/18 0149  Varney Biles, MD 11/05/18 [phone]

## 2018-11-06 ENCOUNTER — Other Ambulatory Visit: Payer: Self-pay

## 2018-11-06 ENCOUNTER — Ambulatory Visit (HOSPITAL_COMMUNITY)
Admission: RE | Admit: 2018-11-06 | Discharge: 2018-11-06 | Disposition: A | Discharge status: Home / Self Care | Payer: 59 | Source: Ambulatory Visit | Attending: Student in an Organized Health Care Education/Training Program | Admitting: Student in an Organized Health Care Education/Training Program

## 2018-11-06 DIAGNOSIS — R911 Solitary pulmonary nodule: Secondary | ICD-10-CM

## 2018-11-06 MED ORDER — HYDROCODONE-ACETAMINOPHEN 5-325 MG PO TABS: 1.0000 | ORAL_TABLET | Freq: Three times a day (TID) | ORAL | 0 refills | Status: AC | PRN

## 2018-11-06 MED ORDER — IOHEXOL 300 MG/ML  SOLN
75.0000 mL | Freq: Once | INTRAMUSCULAR | Status: AC | PRN
  Administered 2018-11-06: 75 mL via INTRAVENOUS

## 2018-11-06 MED ORDER — HYDROCODONE-ACETAMINOPHEN 5-325 MG PO TABS: 1.0000 | ORAL_TABLET | Freq: Three times a day (TID) | ORAL | 0 refills | Status: DC | PRN

## 2018-11-06 NOTE — Addendum Note (Signed)
Addended by: Lalla Brothers T on: 11/06/2018 03:43 PM   Modules accepted: Orders

## 2018-11-06 NOTE — Telephone Encounter (Signed)
I spoke with Debra Rodgers again today. She is currently in the Radiology waiting room, scheduled to get the CT chest done today at 4pm. She reports persistent left lower rib pain, like someone kicked her in the left flank. Pain medication is helpful, no other new symptoms.   Plan is to follow up on the CT chest. I will refill the hydrocodone so she does not run out over the weekend. I will call her on Monday with CT results and next steps.

## 2018-11-09 ENCOUNTER — Telehealth: Payer: Self-pay | Admitting: Internal Medicine

## 2018-11-09 ENCOUNTER — Inpatient Hospital Stay: Payer: 59 | Attending: Internal Medicine | Admitting: Internal Medicine

## 2018-11-09 ENCOUNTER — Other Ambulatory Visit: Payer: Self-pay

## 2018-11-09 ENCOUNTER — Encounter: Payer: Self-pay | Admitting: Internal Medicine

## 2018-11-09 ENCOUNTER — Inpatient Hospital Stay: Payer: 59

## 2018-11-09 DIAGNOSIS — I10 Essential (primary) hypertension: Secondary | ICD-10-CM | POA: Insufficient documentation

## 2018-11-09 DIAGNOSIS — R911 Solitary pulmonary nodule: Secondary | ICD-10-CM

## 2018-11-09 DIAGNOSIS — C3411 Malignant neoplasm of upper lobe, right bronchus or lung: Secondary | ICD-10-CM | POA: Insufficient documentation

## 2018-11-09 DIAGNOSIS — C782 Secondary malignant neoplasm of pleura: Secondary | ICD-10-CM | POA: Insufficient documentation

## 2018-11-09 DIAGNOSIS — Z7982 Long term (current) use of aspirin: Secondary | ICD-10-CM | POA: Insufficient documentation

## 2018-11-09 DIAGNOSIS — C781 Secondary malignant neoplasm of mediastinum: Secondary | ICD-10-CM | POA: Insufficient documentation

## 2018-11-09 DIAGNOSIS — F17210 Nicotine dependence, cigarettes, uncomplicated: Secondary | ICD-10-CM | POA: Insufficient documentation

## 2018-11-09 DIAGNOSIS — C3490 Malignant neoplasm of unspecified part of unspecified bronchus or lung: Secondary | ICD-10-CM

## 2018-11-09 DIAGNOSIS — J90 Pleural effusion, not elsewhere classified: Secondary | ICD-10-CM | POA: Insufficient documentation

## 2018-11-09 DIAGNOSIS — I700 Atherosclerosis of aorta: Secondary | ICD-10-CM | POA: Insufficient documentation

## 2018-11-09 DIAGNOSIS — Z79899 Other long term (current) drug therapy: Secondary | ICD-10-CM | POA: Insufficient documentation

## 2018-11-09 DIAGNOSIS — I6782 Cerebral ischemia: Secondary | ICD-10-CM | POA: Insufficient documentation

## 2018-11-09 DIAGNOSIS — F329 Major depressive disorder, single episode, unspecified: Secondary | ICD-10-CM | POA: Insufficient documentation

## 2018-11-09 NOTE — Telephone Encounter (Signed)
Received a new patient referral from Dr. Evette Doffing for Mass of middle lobe of right lung. Debra Rodgers has been scheduled to see Dr. Julien Nordmann today at 2:15pm w/labs at 1:45pm. She's been made aware to arrive 15 minutes early to be checked in on time. Pt agreed to the appt date and time.

## 2018-11-09 NOTE — Progress Notes (Signed)
[facility] [phone]   [phone]  CONSULT NOTE  REFERRING PHYSICIAN: Dr. Lalla Brothers  REASON FOR CONSULTATION:  73 years old African-American female with highly suspicious metastatic lung cancer.  HPI Debra Rodgers is a 73 y.o. female with past medical history significant for hypertension, depression, as well as long history for smoking.  The patient mentions that she had a fall 2 weeks ago.  She started having pain on the right side of the chest and then moved to the left side of the chest.  She presented to the emergency department on 11/05/2018 and x-ray of the left ribs on 11/05/2018 showed confluent abnormal opacity at the right lung base, nonspecific and was possible associated to small right pleural effusion.  CT scan of the abdomen and pelvis performed on the same day showed no acute intra-abdominal or pelvic pathology.  There was a partially visualized right middle lobe pleural-based mass with multiple additional smaller pulmonary nodules consistent with metastatic disease.  There was also diffuse thickening of the right pleural with pleural based nodules/masses consistent with metastatic implant.  There was also a small right pleural effusion.  The patient had a CT scan of the chest on 11/06/2018 and that showed right middle lobe pleural-based mass measuring 4.1 cm in greatest dimension consistent with primary carcinoma versus metastatic disease.  There was also a smaller scattered pleural-based nodules along the right major fissure and minor fissure consistent with additional metastatic implants.  The scan also showed irregular pleural thickening along the posterior and posterior medial aspects of the right lung, lower lobe predominant with associated loculated effusion concerning for additional metastatic disease.  The scan also showed conglomerate lymphadenopathy within the right lower paratracheal space and subcarinal space with short axis  measurement of 2.3 cm and 2.4 cm respectively compatible with nodal metastasis.  The left lung was clear. The patient was referred to me today for evaluation and recommendation regarding her condition. When seen today she continues to have the chest pain more on the left than the right.  She denied having any significant cough or shortness of breath and no hemoptysis.  She denied having any nausea, vomiting, diarrhea or constipation.  She has a 5 pound of weight loss recently.  She has no visual changes or headache. Family history significant for mother with hypertension, stroke and coronary artery disease, father had heart disease, sister with breast cancer and another sister died from lung cancer. The patient is married and has 2 sons age 26 and 72.  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  She has a history for smoking 0.5 pack/day for over 50 years and unfortunately she continues to smoke.  She has no history of alcohol or drug abuse.  HPI  Past Medical History:  Diagnosis Date   Depression    Essential hypertension    History of migraine headaches    Tobacco use disorder     Past Surgical History:  Procedure Laterality Date   ABDOMINAL HYSTERECTOMY     CHOLECYSTECTOMY     ROTATOR CUFF REPAIR  3/04    Family History  Problem Relation Age of Onset   Hypertension Mother    Stroke Mother    Coronary artery disease Mother    Heart disease Father    Diabetes Sister    Hypertension Sister    Cancer Sister    Breast cancer Sister     Social History Social History  Tobacco Use   Smoking status: Current Every Day Smoker    Packs/day: 0.50    Years: 35.00    Pack years: 17.50    Types: Cigarettes   Smokeless tobacco: Never Used   Tobacco comment: DOWN TO 1/2PACK PER DAY - Quit line info given  Substance Use Topics   Alcohol use: No    Alcohol/week: 0.0 standard drinks   Drug use: No    Allergies  Allergen Reactions   Codeine  Sulfate     REACTION: "makes me high"   Pantoprazole Sodium     REACTION: Rash, facial swelling    Current Outpatient Medications  Medication Sig Dispense Refill   acetaminophen (TYLENOL) 325 MG tablet Take 2 tablets (650 mg total) by mouth every 6 (six) hours as needed. 30 tablet 0   amLODipine (NORVASC) 10 MG tablet TAKE 1 TABLET BY MOUTH EVERY DAY 30 tablet 5   aspirin 81 MG EC tablet Take 81 mg by mouth daily.       atorvastatin (LIPITOR) 20 MG tablet TAKE 1 TABLET BY MOUTH EVERY DAY 90 tablet 3   chlorthalidone (HYGROTON) 50 MG tablet TAKE 1 TABLET BY MOUTH EVERY DAY 90 tablet 1   famotidine (PEPCID) 20 MG tablet Take 20 mg by mouth daily.      FLUoxetine (PROZAC) 20 MG capsule TAKE 1 CAPSULE (20 MG TOTAL) DAILY BY MOUTH. 30 capsule 5   gabapentin (NEURONTIN) 100 MG capsule Take 1 capsule (100 mg total) by mouth 2 (two) times daily. 180 capsule 3   HYDROcodone-acetaminophen (NORCO/VICODIN) 5-325 MG tablet Take 1 tablet by mouth every 8 (eight) hours as needed for up to 3 days for severe pain. 9 tablet 0   lisinopril (ZESTRIL) 10 MG tablet TAKE 1 TABLET BY MOUTH EVERY DAY 90 tablet 1   nicotine ([employer] NICOTINE TRANSDERMAL SYS) 14 mg/24hr patch Place 1 patch (14 mg total) onto the skin daily. 42 patch 0   VENTOLIN HFA 108 (90 Base) MCG/ACT inhaler INHALE 1-2 PUFFS INTO THE LUNGS EVERY 6 (SIX) HOURS AS NEEDED FOR WHEEZING OR SHORTNESS OF BREATH. 18 Inhaler 0   amoxicillin (AMOXIL) 875 MG tablet Take 1 tablet (875 mg total) by mouth 2 (two) times daily. (Patient not taking: Reported on 11/09/2018) 14 tablet 0   FLUoxetine (PROZAC) 40 MG capsule Take 1 capsule (40 mg total) by mouth daily. (Patient not taking: Reported on 11/09/2018) 90 capsule 3   gabapentin (NEURONTIN) 100 MG capsule TAKE 1 CAPSULE (100 MG TOTAL) BY MOUTH AT BEDTIME. (Patient not taking: Reported on 11/09/2018) 90 capsule 3   No current facility-administered medications for this visit.     Review of  Systems  Constitutional: positive for fatigue and weight loss Eyes: negative Ears, nose, mouth, throat, and face: negative Respiratory: positive for pleurisy/chest pain Cardiovascular: negative Gastrointestinal: negative Genitourinary:negative Integument/breast: negative Hematologic/lymphatic: negative Musculoskeletal:negative Neurological: negative Behavioral/Psych: negative Endocrine: negative Allergic/Immunologic: negative  Physical Exam  YTK:ZSWFU, healthy, no distress, well nourished and well developed SKIN: skin color, texture, turgor are normal, no rashes or significant lesions HEAD: Normocephalic, No masses, lesions, tenderness or abnormalities EYES: normal, PERRLA, Conjunctiva are pink and non-injected EARS: External ears normal, Canals clear OROPHARYNX:no exudate, no erythema and lips, buccal mucosa, and tongue normal  NECK: supple, no adenopathy, no JVD LYMPH:  no palpable lymphadenopathy, no hepatosplenomegaly BREAST:not examined LUNGS: clear to auscultation , and palpation HEART: regular rate & rhythm, no murmurs and no gallops ABDOMEN:abdomen soft, non-tender, normal bowel sounds and no masses or  organomegaly BACK: Back symmetric, no curvature., No CVA tenderness EXTREMITIES:no joint deformities, effusion, or inflammation, no edema  NEURO: alert & oriented x 3 with fluent speech, no focal motor/sensory deficits  PERFORMANCE STATUS: ECOG 1  LABORATORY DATA: Lab Results  Component Value Date   WBC 6.2 11/04/2018   HGB 12.0 11/04/2018   HCT 35.7 (L) 11/04/2018   MCV 96.7 11/04/2018   PLT 339 11/04/2018      Chemistry      Component Value Date/Time   NA 136 11/04/2018 1820   NA 139 07/21/2017 0958   K 3.3 (L) 11/04/2018 1820   CL 98 11/04/2018 1820   CO2 25 11/04/2018 1820   BUN 25 (H) 11/04/2018 1820   BUN 26 07/21/2017 0958   CREATININE 0.95 11/04/2018 1820   CREATININE 0.79 09/03/2013 1540      Component Value Date/Time   CALCIUM 10.6 (H)  11/04/2018 1820   ALKPHOS 71 11/04/2018 1820   AST 14 (L) 11/04/2018 1820   ALT 9 11/04/2018 1820   BILITOT 0.5 11/04/2018 1820       RADIOGRAPHIC STUDIES: Dg Ribs Unilateral W/chest Left  Result Date: 11/05/2018 CLINICAL DATA:  73 year old female status post fall with chest pain greater on the left. EXAM: LEFT RIBS AND CHEST - 3+ VIEW COMPARISON:  Chest radiographs 02/26/2013. left rib series 03/21/2017. FINDINGS: PA view of the chest demonstrates confluent abnormal right lung base opacity. There is stable chronic elevation of the left hemidiaphragm. Tortuous thoracic aorta. Other mediastinal contours are within normal limits. Visualized tracheal air column is within normal limits. No pneumothorax or pulmonary edema. Questionable small right pleural effusion. No left rib fracture identified. Other visible osseous structures appear intact. Prior lower cervical ACDF. Negative visible bowel gas pattern. IMPRESSION: 1. Confluent abnormal opacity at the right lung base, nonspecific, and with possible associated small right pleural effusion. If there are no signs/symptoms of infection then recommend Chest CT (IV contrast preferred) to further characterize. If pneumonia is suspected then Followup PA and lateral chest X-ray is recommended in 3-4 weeks following trial of antibiotic therapy. 2. No left rib fracture or other acute cardiopulmonary abnormality. Electronically Signed   By: Genevie Ann M.D.   On: 11/05/2018 00:18   Ct Chest W Contrast  Result Date: 11/07/2018 CLINICAL DATA:  On yesterday's abdomen CT, report described RIGHT middle lobe pleural based mass and multiple additional smaller pulmonary nodules consistent with metastatic disease. CT report also described diffuse thickening of the RIGHT pleural with pleural based nodule/masses consistent with metastatic implants. Small RIGHT pleural effusion was also noted. Dedicated chest CT was recommended. EXAM: CT CHEST WITH CONTRAST TECHNIQUE:  Multidetector CT imaging of the chest was performed during intravenous contrast administration. CONTRAST:  76m OMNIPAQUE IOHEXOL 300 MG/ML  SOLN COMPARISON:  CT abdomen from yesterday. FINDINGS: Cardiovascular: Scattered aortic atherosclerosis. No thoracic aortic aneurysm. Heart size is within normal limits. No pericardial effusion. Mediastinum/Nodes: Esophagus is not well seen in its entirety but visualized portions are unremarkable. Conglomerate lymphadenopathy within the RIGHT lower paratracheal space and subcarinal space, with short axis measurements of 2.3 cm and 2.4 cm respectively. Trachea is unremarkable. Lungs/Pleura: RIGHT middle lobe mass measures 4.1 cm, pleural based as described on earlier abdomen CT report. Smaller scattered pleural based nodules along the RIGHT major fissure and minor fissure, almost certainly additional metastatic implants. Also again noted is irregular pleural thickening, and presumed pleural based mass, at the RIGHT lung base posteriorly, with associated small pleural effusion. LEFT lung is clear.  Emphysematous changes at the lung apices, mild to moderate in degree, with associated emphysematous blebs. Upper Abdomen: No acute findings on limited images of the upper abdomen. Musculoskeletal: Advanced degenerative change within the lower cervical spine, incompletely imaged. Additional degenerative spondylosis within the upper and lower portions of the slightly scoliotic thoracic spine. No acute appearing osseous abnormality. IMPRESSION: 1. RIGHT middle lobe pleural based mass measures 4.1 cm greatest dimension, as described on yesterday's abdomen CT report, consistent with primary carcinoma versus metastatic disease. 2. Smaller scattered pleural based nodules along the RIGHT major fissure and minor fissure, almost certainly additional metastatic implants. 3. Irregular pleural thickening along the posterior and posterior-medial aspects of the RIGHT lung, lower lobe predominant, with  associated loculated effusion, almost certainly representing additional metastatic disease. 4. Conglomerate lymphadenopathy within the RIGHT lower paratracheal space and subcarinal space, with short axis measurements of 2.3 cm and 2.4 cm respectively, compatible with nodal metastases. 5. Emphysematous changes at the lung apices, mild to moderate in degree, with associated emphysematous blebs. 6. LEFT lung is clear. 7. Aortic atherosclerosis. Aortic Atherosclerosis (ICD10-I70.0) and Emphysema (ICD10-J43.9). Electronically Signed   By: Franki Cabot M.D.   On: 11/07/2018 13:47   Ct Abdomen Pelvis W Contrast  Result Date: 11/05/2018 CLINICAL DATA:  73 year old female with abdominal distention and epigastric and flank pain. Patient fell 2 weeks ago. EXAM: CT ABDOMEN AND PELVIS WITH CONTRAST TECHNIQUE: Multidetector CT imaging of the abdomen and pelvis was performed using the standard protocol following bolus administration of intravenous contrast. CONTRAST:  158m OMNIPAQUE IOHEXOL 300 MG/ML  SOLN COMPARISON:  CT of the abdomen pelvis dated 02/18/2017 and left rib radiograph dated 11/05/2018 FINDINGS: Lower chest: Partially visualized 3.3 x 2.5 cm pleural based mass in the right middle lobe. Several additional smaller nodules noted in the right lung base. There is thickening and nodularity of the right lower pleural surface with multiple pleural implants/masses consistent with metastatic disease. There is a small right pleural effusion. Dedicated chest CT with contrast is recommended for evaluation of the lungs. There is no intra-abdominal free air or free fluid. Hepatobiliary: Several subcentimeter hepatic hypodense foci measure up to 9 mm. These lesions are too small to characterize but appears similar to prior CT and not appear to demonstrate enhancement, likely benign etiology such as cyst or hemangiomata. There is mild intrahepatic biliary ductal dilatation. The gallbladder is not visualized, likely surgically  absent. No retained calcified stone noted in the central CBD. Pancreas: Unremarkable. No pancreatic ductal dilatation or surrounding inflammatory changes. Spleen: Normal in size without focal abnormality. Adrenals/Urinary Tract: The adrenal glands are unremarkable. There is no hydronephrosis on either side. There is symmetric enhancement and excretion of contrast by both kidneys. The urinary bladder is partially distended and grossly unremarkable. Stomach/Bowel: There is sigmoid diverticulosis without active inflammatory changes. There is no bowel obstruction or active inflammation. The appendix is not visualized with certainty. No inflammatory changes identified in the right lower quadrant. Vascular/Lymphatic: There is moderate aortoiliac atherosclerotic disease. 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 No aortic dissection. No periaortic inflammation or contrast extravasation. The origins of the celiac axis, SMA, IMA and the renal arteries appear patent. The SMV, splenic vein, and main portal vein are patent. No portal venous gas. There is no adenopathy. Reproductive: Hysterectomy. There is a 6.3 cm left adnexal cyst. Further evaluation with pelvic ultrasound on a nonemergent basis recommended. Other: Midline vertical anterior pelvic wall incisional scar. No fluid collection. Musculoskeletal: Multilevel degenerative changes of the  spine with disc desiccation and vacuum phenomena. No acute osseous pathology. No suspicious osseous lesions. IMPRESSION: 1. No acute intra-abdominal or pelvic pathology. 2. Partially visualized right middle lobe pleural-based mass with multiple additional smaller pulmonary nodules consistent with metastatic disease. Diffuse thickening of the right pleural with pleural-based nodules/masses consistent with metastatic implant. A small right pleural effusion is also noted. Dedicated chest CT with contrast is recommended for evaluation of the lungs. 3. Partially  thrombosed 3.8 cm infrarenal aortic aneurysm. Recommend followup by ultrasound in 2 years. This recommendation follows ACR consensus guidelines: White Paper of the ACR Incidental Findings Committee II on Vascular Findings. J Am Coll Radiol 2013; 10:789-794. Aortic aneurysm NOS (ICD10-I71.9) 4. Sigmoid diverticulosis. No bowel obstruction or active inflammation. 5. A 6.3 cm left adnexal cyst. Further evaluation with pelvic ultrasound on a nonemergent basis recommended. 6. Aortic Atherosclerosis (ICD10-I70.0). Electronically Signed   By: Anner Crete M.D.   On: 11/05/2018 01:16    ASSESSMENT: This is a very pleasant 73 years old African-American female with highly suspicious metastatic lung cancer presented with right upper lobe lung mass in addition to pleural-based metastasis and mediastinal lymphadenopathy.   PLAN: I had a lengthy discussion with the patient today about her current disease condition and further investigation to confirm her diagnosis. I personally and independently reviewed the scan images and showed the images to the patient today. I recommended for her to have a PET scan and MRI of the brain to complete the staging work-up for her disease. I will refer the patient to Dr. Valeta Harms for consideration of bronchoscopy with endobronchial ultrasound and biopsy for tissue diagnosis. I will see the patient back for follow-up visit in around 2 weeks for further evaluation and discussion of her treatment options based on the final pathology and staging work-up. She was advised to call immediately if she has any other concerning symptoms in the interval. The patient voices understanding of current disease status and treatment options and is in agreement with the current care plan.  All questions were answered. The patient knows to call the clinic with any problems, questions or concerns. We can certainly see the patient much sooner if necessary.  Thank you so much for allowing me to  participate in the care of Debra Rodgers. I will continue to follow up the patient with you and assist in her care.  I spent 40 minutes counseling the patient face to face. The total time spent in the appointment was 60 minutes.  Disclaimer: This note was dictated with voice recognition software. Similar sounding words can inadvertently be transcribed and may not be corrected upon review.   Eilleen Kempf November 09, 2018, 2:12 PM

## 2018-11-09 NOTE — Telephone Encounter (Signed)
I followed up with Debra Rodgers this morning. She is doing ok, the pain in her left side comes and goes. Hydrocodone is effective in relieving the pain. Otherwise doing ok at home. We reviewed the results of the chest CT, which shows several pleural based right lung masses and mediastinal lymphadenopathy. This is most consistent with a new lung cancer in this person with tobacco use. Plan is to refer her to [facility] at this point. I am not sure if the next best test should be PET scan or bronchoscopy with biopsy. I will let them decide with the patient. I will follow up with Debra Rodgers later in the week about her pain. The left side is clear with no masses, no lytic lesions, spleen looks ok, no gastric distention, a little colon distention, but nothing else to explain her left flank pain. Hopefully it will be self limited and improve this week.

## 2018-11-09 NOTE — Addendum Note (Signed)
Addended by: Lalla Brothers T on: 11/09/2018 08:38 AM   Modules accepted: Orders

## 2018-11-10 ENCOUNTER — Other Ambulatory Visit: Payer: Self-pay

## 2018-11-10 ENCOUNTER — Telehealth: Payer: Self-pay | Admitting: *Deleted

## 2018-11-10 ENCOUNTER — Telehealth: Payer: Self-pay | Admitting: Internal Medicine

## 2018-11-10 ENCOUNTER — Telehealth: Payer: Self-pay

## 2018-11-10 MED ORDER — HYDROCODONE-ACETAMINOPHEN 7.5-325 MG PO TABS: 1.0000 | ORAL_TABLET | Freq: Three times a day (TID) | ORAL | 0 refills | Status: AC | PRN

## 2018-11-10 NOTE — Telephone Encounter (Signed)
-----   Message from Julian Hy, DO sent at 11/09/2018  3:12 PM EDT ----- Regarding: RE: EBUS Referral I am happy to see the patient. I should have open slots later this week.  Arby Barrette ----- Message ----- From: Garner Nash, DO Sent: 11/09/2018   2:43 PM EDT To: Collene Gobble, MD, [facility], # Subject: EBUS Referral                                  Dantae Meunier/Patrice,   I am out this week. I received a referral from Dr. Julien Nordmann. Can we place this patient in first available slot with [facility]. New consult. Will need scheduled for bronch.   Thanks  Leory Plowman

## 2018-11-10 NOTE — Telephone Encounter (Signed)
Pt would like a refill of pain med, states pain is up to 20 now but 1 pill last 8 hrs and she can rest. She states she just took her last pill

## 2018-11-10 NOTE — Telephone Encounter (Signed)
Requesting to speak with a nurse about kidney stone and pain med. Please call pt back.

## 2018-11-10 NOTE — Telephone Encounter (Signed)
Scheduled appt per 8/17 los - pt aware of appt date and time

## 2018-11-10 NOTE — Telephone Encounter (Signed)
Agreed. Refill sent. Malignancy associated pain that will likely need continued treatment with opioids.

## 2018-11-10 NOTE — Telephone Encounter (Signed)
In another encounter

## 2018-11-10 NOTE — Telephone Encounter (Signed)
Call made to patient, patient reports due to other appt she is only avaiable 08/28 after 4. Appt made for 08/28 @ 430pm with Dr. Lamonte Sakai. Nothing further needed at this time.

## 2018-11-11 ENCOUNTER — Encounter: Payer: Self-pay | Admitting: *Deleted

## 2018-11-11 NOTE — Progress Notes (Signed)
Oncology Nurse Navigator Documentation  Oncology Nurse Navigator Flowsheets 11/11/2018  Navigator Location [facility]  Referral Date to RadOnc/MedOnc 11/09/2018  Navigator Encounter Type Other/per Dr. Julien Nordmann, patient needs PET scan and MRI Brain.  I checked today and scans are not authorized.  I contacted authorization to get approved then get it scheduled.   Treatment Phase Abnormal Scans  Barriers/Navigation Needs Coordination of Care  Interventions Coordination of Care  Coordination of Care Other  Acuity Level 2  Time Spent with Patient 30

## 2018-11-18 ENCOUNTER — Other Ambulatory Visit: Payer: Self-pay

## 2018-11-18 ENCOUNTER — Encounter (HOSPITAL_COMMUNITY)
Admission: RE | Admit: 2018-11-18 | Discharge: 2018-11-18 | Disposition: A | Discharge status: Home / Self Care | Payer: 59 | Source: Ambulatory Visit | Attending: Internal Medicine | Admitting: Internal Medicine

## 2018-11-18 DIAGNOSIS — R911 Solitary pulmonary nodule: Secondary | ICD-10-CM

## 2018-11-18 LAB — GLUCOSE, CAPILLARY: Glucose-Capillary: 85 mg/dL (ref 70–99)

## 2018-11-18 MED ORDER — FLUDEOXYGLUCOSE F - 18 (FDG) INJECTION
6.0500 | Freq: Once | INTRAVENOUS | Status: AC
  Administered 2018-11-18: 6.05 via INTRAVENOUS

## 2018-11-19 ENCOUNTER — Ambulatory Visit (HOSPITAL_COMMUNITY)
Admission: RE | Admit: 2018-11-19 | Discharge: 2018-11-19 | Disposition: A | Discharge status: Home / Self Care | Payer: 59 | Source: Ambulatory Visit | Attending: Internal Medicine | Admitting: Internal Medicine

## 2018-11-19 DIAGNOSIS — C3490 Malignant neoplasm of unspecified part of unspecified bronchus or lung: Secondary | ICD-10-CM | POA: Diagnosis not present

## 2018-11-19 MED ORDER — GADOBUTROL 1 MMOL/ML IV SOLN
6.0000 mL | Freq: Once | INTRAVENOUS | Status: AC | PRN
  Administered 2018-11-19: 6 mL via INTRAVENOUS

## 2018-11-20 ENCOUNTER — Other Ambulatory Visit: Payer: Self-pay | Admitting: *Deleted

## 2018-11-20 ENCOUNTER — Telehealth: Payer: Self-pay | Admitting: Internal Medicine

## 2018-11-20 ENCOUNTER — Other Ambulatory Visit: Payer: Self-pay

## 2018-11-20 ENCOUNTER — Ambulatory Visit: Payer: 59 | Admitting: Emergency Medicine

## 2018-11-20 ENCOUNTER — Encounter: Payer: Self-pay | Admitting: Emergency Medicine

## 2018-11-20 DIAGNOSIS — C3490 Malignant neoplasm of unspecified part of unspecified bronchus or lung: Secondary | ICD-10-CM

## 2018-11-20 DIAGNOSIS — R918 Other nonspecific abnormal finding of lung field: Secondary | ICD-10-CM | POA: Diagnosis not present

## 2018-11-20 DIAGNOSIS — J439 Emphysema, unspecified: Secondary | ICD-10-CM | POA: Diagnosis not present

## 2018-11-20 DIAGNOSIS — Z720 Tobacco use: Secondary | ICD-10-CM | POA: Diagnosis not present

## 2018-11-20 MED ORDER — HYDROCODONE-ACETAMINOPHEN 7.5-325 MG PO TABS: 1.0000 | ORAL_TABLET | Freq: Three times a day (TID) | ORAL | 0 refills | Status: AC | PRN

## 2018-11-20 NOTE — H&P (View-Only) (Signed)
Subjective:    Patient ID: Debra Rodgers, female    DOB: 02-19-1946, 73 y.o.   MRN: [id number]  HPI 73 year old woman, smoker (35 pack years) with hypertension, migraines.  Referred today by Dr. Earlie Server for an abnormal CT scan of the chest.  She has been experiencing bilateral rib pain, upper left abdominal pain since 2-3 weeks ago.  She was seen in the emergency department and a CT scan of her abdomen and chest revealed a right middle lobe mass, 4.1 cm with bilateral pleural-based nodules and small effusion, thickening, mediastinal lymphadenopathy.  The left lung was uninvolved.  This prompted PET scan on 8/26 which I reviewed and which shows hypermetabolism in the right middle lobe mass, the pleural nodules and in the mediastinum.  There was no evidence of extrathoracic hypermetabolism seen.  Finally an MRI brain was done on 8/27 that I reviewed, shows no intracranial metastatic disease.  She presents today to address her abnormal CT. She denies any breathing limitations. She does pace herself with walking, due to fatigue and some dizziness. She paces herself to do housework. She has occasional cough. Has some clear sputum, never hemoptysis. Her cigarettes are down to 2-3 a day. Has albuterol, uses it a few times a week.    Review of Systems  Constitutional: Negative for fever and unexpected weight change.  HENT: Negative for congestion, dental problem, ear pain, nosebleeds, postnasal drip, rhinorrhea, sinus pressure, sneezing, sore throat and trouble swallowing.   Eyes: Negative for redness and itching.  Respiratory: Negative for cough, chest tightness, shortness of breath and wheezing.   Cardiovascular: Positive for chest pain. Negative for palpitations and leg swelling.  Gastrointestinal: Negative for nausea and vomiting.  Genitourinary: Negative for dysuria.  Musculoskeletal: Negative for joint swelling.  Skin: Negative for rash.  Neurological: Negative for headaches.  Hematological:  Does not bruise/bleed easily.  Psychiatric/Behavioral: Negative for dysphoric mood. The patient is not nervous/anxious.     Past Medical History:  Diagnosis Date  . Depression   . Essential hypertension   . History of migraine headaches   . Tobacco use disorder      Family History  Problem Relation Age of Onset  . Hypertension Mother   . Stroke Mother   . Coronary artery disease Mother   . Heart disease Father   . Diabetes Sister   . Hypertension Sister   . Cancer Sister   . Breast cancer Sister      Social History   Socioeconomic History  . Marital status: Married    Spouse name: Not on file  . Number of children: Not on file  . Years of education: Not on file  . Highest education level: Not on file  Occupational History  . Not on file  Social Needs  . Financial resource strain: Not on file  . Food insecurity    Worry: Not on file    Inability: Not on file  . Transportation needs    Medical: Not on file    Non-medical: Not on file  Tobacco Use  . Smoking status: Current Every Day Smoker    Packs/day: 0.50    Years: 35.00    Pack years: 17.50    Types: Cigarettes  . Smokeless tobacco: Never Used  Substance and Sexual Activity  . Alcohol use: No    Alcohol/week: 0.0 standard drinks  . Drug use: No  . Sexual activity: Never    Partners: Male  Lifestyle  . Physical activity    Days  per week: Not on file    Minutes per session: Not on file  . Stress: Not on file  Relationships  . Social Herbalist on phone: Not on file    Gets together: Not on file    Attends religious service: Not on file    Active member of club or organization: Not on file    Attends meetings of clubs or organizations: Not on file    Relationship status: Not on file  . Intimate partner violence    Fear of current or ex partner: Not on file    Emotionally abused: Not on file    Physically abused: Not on file    Forced sexual activity: Not on file  Other Topics Concern   . 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   Allergies  Allergen Reactions  . Codeine Sulfate     REACTION: "makes me high"  . Pantoprazole Sodium     REACTION: Rash, facial swelling     Outpatient Medications Prior to Visit  Medication Sig Dispense Refill  . acetaminophen (TYLENOL) 325 MG tablet Take 2 tablets (650 mg total) by mouth every 6 (six) hours as needed. 30 tablet 0  . amLODipine (NORVASC) 10 MG tablet TAKE 1 TABLET BY MOUTH EVERY DAY 30 tablet 5  . aspirin 81 MG EC tablet Take 81 mg by mouth daily.      Marland Kitchen atorvastatin (LIPITOR) 20 MG tablet TAKE 1 TABLET BY MOUTH EVERY DAY 90 tablet 3  . chlorthalidone (HYGROTON) 50 MG tablet TAKE 1 TABLET BY MOUTH EVERY DAY 90 tablet 1  . famotidine (PEPCID) 20 MG tablet Take 20 mg by mouth daily.     Marland Kitchen FLUoxetine (PROZAC) 20 MG capsule TAKE 1 CAPSULE (20 MG TOTAL) DAILY BY MOUTH. 30 capsule 5  . gabapentin (NEURONTIN) 100 MG capsule Take 1 capsule (100 mg total) by mouth 2 (two) times daily. 180 capsule 3  . lisinopril (ZESTRIL) 10 MG tablet TAKE 1 TABLET BY MOUTH EVERY DAY 90 tablet 1  . nicotine ([facility] NICOTINE TRANSDERMAL SYS) 14 mg/24hr patch Place 1 patch (14 mg total) onto the skin daily. 42 patch 0  . VENTOLIN HFA 108 (90 Base) MCG/ACT inhaler INHALE 1-2 PUFFS INTO THE LUNGS EVERY 6 (SIX) HOURS AS NEEDED FOR WHEEZING OR SHORTNESS OF BREATH. 18 Inhaler 0  . amoxicillin (AMOXIL) 875 MG tablet Take 1 tablet (875 mg total) by mouth 2 (two) times daily. (Patient not taking: Reported on 11/09/2018) 14 tablet 0  . FLUoxetine (PROZAC) 40 MG capsule Take 1 capsule (40 mg total) by mouth daily. (Patient not taking: Reported on 11/09/2018) 90 capsule 3  . gabapentin (NEURONTIN) 100 MG capsule TAKE 1 CAPSULE (100 MG TOTAL) BY MOUTH AT BEDTIME. (Patient not taking: Reported on 11/09/2018) 90 capsule 3   No facility-administered medications prior to visit.         Objective:    Physical Exam  Vitals:   11/20/18 1051  BP: 122/80  Pulse: 68  SpO2: 100%  Weight: 128 lb (58.1 kg)  Height: 5' 1"  (1.549 m)   Gen: Pleasant, well-nourished, in no distress,  normal affect  ENT: No lesions,  mouth clear,  oropharynx clear, no postnasal drip  Neck: No JVD, no stridor  Lungs: No use of accessory muscles, no crackles or wheezing on normal respiration, no wheeze on forced expiration  Cardiovascular: RRR, heart  sounds normal, no murmur or gallops, no peripheral edema  Musculoskeletal: No deformities, no cyanosis or clubbing; some R and L costal margin tenderness to palpation. 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  She has mediastinal lymphadenopathy as well.  Either TTNA of the right middle lobe mass or ultrasound-guided needle biopsy of the mediastinal nodes would be reasonable strategies.  The nodal biopsies would likely give [place] additional information about disease spread and I do favor this.  We discussed today she agrees.  We will arrange for bronchoscopy with endobronchial ultrasound, possible right middle lobe biopsies under fluoroscopy.  Tobacco use Working on trying to stop.  She has been able to decrease.  COPD (chronic obstructive pulmonary disease) (HCC) Presumed COPD.  Minimal symptoms.  She does have albuterol available to use as needed and does seem to benefit.  Suspect she may benefit in the future from scheduled bronchodilator therapy.  We will perform full PFT in the future, consider scheduled therapy going forward.   Baltazar Apo, MD, PhD 11/20/2018, 1:38 PM [facility] [phone] or if no answer [phone]

## 2018-11-20 NOTE — Assessment & Plan Note (Signed)
Working on trying to stop.  She has been able to decrease.

## 2018-11-20 NOTE — Telephone Encounter (Signed)
Pt reports continued chest wall pain which is relieved by Hydrocodone.  She reports no side effects, says she is completely out of this medicine and would like some help.  Will refill a short supply to get her through the weekend and she will seek further evaluation.

## 2018-11-20 NOTE — Assessment & Plan Note (Signed)
Presumed COPD.  Minimal symptoms.  She does have albuterol available to use as needed and does seem to benefit.  Suspect she may benefit in the future from scheduled bronchodilator therapy.  We will perform full PFT in the future, consider scheduled therapy going forward.

## 2018-11-20 NOTE — Assessment & Plan Note (Signed)
This looks like stage IV disease given the pleural studding that hypermetabolic on CT.  She has mediastinal lymphadenopathy as well.  Either TTNA of the right middle lobe mass or ultrasound-guided needle biopsy of the mediastinal nodes would be reasonable strategies.  The nodal biopsies would likely give [place] additional information about disease spread and I do favor this.  We discussed today she agrees.  We will arrange for bronchoscopy with endobronchial ultrasound, possible right middle lobe biopsies under fluoroscopy.

## 2018-11-20 NOTE — Progress Notes (Signed)
Subjective:    Patient ID: Debra Rodgers, female    DOB: 1945/07/16, 73 y.o.   MRN: [id number]  HPI 73 year old woman, smoker (35 pack years) with hypertension, migraines.  Referred today by Dr. Earlie Server for an abnormal CT scan of the chest.  She has been experiencing bilateral rib pain, upper left abdominal pain since 2-3 weeks ago.  She was seen in the emergency department and a CT scan of her abdomen and chest revealed a right middle lobe mass, 4.1 cm with bilateral pleural-based nodules and small effusion, thickening, mediastinal lymphadenopathy.  The left lung was uninvolved.  This prompted PET scan on 8/26 which I reviewed and which shows hypermetabolism in the right middle lobe mass, the pleural nodules and in the mediastinum.  There was no evidence of extrathoracic hypermetabolism seen.  Finally an MRI brain was done on 8/27 that I reviewed, shows no intracranial metastatic disease.  She presents today to address her abnormal CT. She denies any breathing limitations. She does pace herself with walking, due to fatigue and some dizziness. She paces herself to do housework. She has occasional cough. Has some clear sputum, never hemoptysis. Her cigarettes are down to 2-3 a day. Has albuterol, uses it a few times a week.    Review of Systems  Constitutional: Negative for fever and unexpected weight change.  HENT: Negative for congestion, dental problem, ear pain, nosebleeds, postnasal drip, rhinorrhea, sinus pressure, sneezing, sore throat and trouble swallowing.   Eyes: Negative for redness and itching.  Respiratory: Negative for cough, chest tightness, shortness of breath and wheezing.   Cardiovascular: Positive for chest pain. Negative for palpitations and leg swelling.  Gastrointestinal: Negative for nausea and vomiting.  Genitourinary: Negative for dysuria.  Musculoskeletal: Negative for joint swelling.  Skin: Negative for rash.  Neurological: Negative for headaches.  Hematological:  Does not bruise/bleed easily.  Psychiatric/Behavioral: Negative for dysphoric mood. The patient is not nervous/anxious.     Past Medical History:  Diagnosis Date  . Depression   . Essential hypertension   . History of migraine headaches   . Tobacco use disorder      Family History  Problem Relation Age of Onset  . Hypertension Mother   . Stroke Mother   . Coronary artery disease Mother   . Heart disease Father   . Diabetes Sister   . Hypertension Sister   . Cancer Sister   . Breast cancer Sister      Social History   Socioeconomic History  . Marital status: Married    Spouse name: Not on file  . Number of children: Not on file  . Years of education: Not on file  . Highest education level: Not on file  Occupational History  . Not on file  Social Needs  . Financial resource strain: Not on file  . Food insecurity    Worry: Not on file    Inability: Not on file  . Transportation needs    Medical: Not on file    Non-medical: Not on file  Tobacco Use  . Smoking status: Current Every Day Smoker    Packs/day: 0.50    Years: 35.00    Pack years: 17.50    Types: Cigarettes  . Smokeless tobacco: Never Used  Substance and Sexual Activity  . Alcohol use: No    Alcohol/week: 0.0 standard drinks  . Drug use: No  . Sexual activity: Never    Partners: Male  Lifestyle  . Physical activity    Days  per week: Not on file    Minutes per session: Not on file  . Stress: Not on file  Relationships  . Social Herbalist on phone: Not on file    Gets together: Not on file    Attends religious service: Not on file    Active member of club or organization: Not on file    Attends meetings of clubs or organizations: Not on file    Relationship status: Not on file  . Intimate partner violence    Fear of current or ex partner: Not on file    Emotionally abused: Not on file    Physically abused: Not on file    Forced sexual activity: Not on file  Other Topics Concern   . 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   Allergies  Allergen Reactions  . Codeine Sulfate     REACTION: "makes me high"  . Pantoprazole Sodium     REACTION: Rash, facial swelling     Outpatient Medications Prior to Visit  Medication Sig Dispense Refill  . acetaminophen (TYLENOL) 325 MG tablet Take 2 tablets (650 mg total) by mouth every 6 (six) hours as needed. 30 tablet 0  . amLODipine (NORVASC) 10 MG tablet TAKE 1 TABLET BY MOUTH EVERY DAY 30 tablet 5  . aspirin 81 MG EC tablet Take 81 mg by mouth daily.      Marland Kitchen atorvastatin (LIPITOR) 20 MG tablet TAKE 1 TABLET BY MOUTH EVERY DAY 90 tablet 3  . chlorthalidone (HYGROTON) 50 MG tablet TAKE 1 TABLET BY MOUTH EVERY DAY 90 tablet 1  . famotidine (PEPCID) 20 MG tablet Take 20 mg by mouth daily.     Marland Kitchen FLUoxetine (PROZAC) 20 MG capsule TAKE 1 CAPSULE (20 MG TOTAL) DAILY BY MOUTH. 30 capsule 5  . gabapentin (NEURONTIN) 100 MG capsule Take 1 capsule (100 mg total) by mouth 2 (two) times daily. 180 capsule 3  . lisinopril (ZESTRIL) 10 MG tablet TAKE 1 TABLET BY MOUTH EVERY DAY 90 tablet 1  . nicotine ([employer] NICOTINE TRANSDERMAL SYS) 14 mg/24hr patch Place 1 patch (14 mg total) onto the skin daily. 42 patch 0  . VENTOLIN HFA 108 (90 Base) MCG/ACT inhaler INHALE 1-2 PUFFS INTO THE LUNGS EVERY 6 (SIX) HOURS AS NEEDED FOR WHEEZING OR SHORTNESS OF BREATH. 18 Inhaler 0  . amoxicillin (AMOXIL) 875 MG tablet Take 1 tablet (875 mg total) by mouth 2 (two) times daily. (Patient not taking: Reported on 11/09/2018) 14 tablet 0  . FLUoxetine (PROZAC) 40 MG capsule Take 1 capsule (40 mg total) by mouth daily. (Patient not taking: Reported on 11/09/2018) 90 capsule 3  . gabapentin (NEURONTIN) 100 MG capsule TAKE 1 CAPSULE (100 MG TOTAL) BY MOUTH AT BEDTIME. (Patient not taking: Reported on 11/09/2018) 90 capsule 3   No facility-administered medications prior to visit.         Objective:    Physical Exam  Vitals:   11/20/18 1051  BP: 122/80  Pulse: 68  SpO2: 100%  Weight: 128 lb (58.1 kg)  Height: 5' 1"  (1.549 m)   Gen: Pleasant, well-nourished, in no distress,  normal affect  ENT: No lesions,  mouth clear,  oropharynx clear, no postnasal drip  Neck: No JVD, no stridor  Lungs: No use of accessory muscles, no crackles or wheezing on normal respiration, no wheeze on forced expiration  Cardiovascular: RRR, heart  sounds normal, no murmur or gallops, no peripheral edema  Musculoskeletal: No deformities, no cyanosis or clubbing; some R and L costal margin tenderness to palpation. 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  She has mediastinal lymphadenopathy as well.  Either TTNA of the right middle lobe mass or ultrasound-guided needle biopsy of the mediastinal nodes would be reasonable strategies.  The nodal biopsies would likely give [place] additional information about disease spread and I do favor this.  We discussed today she agrees.  We will arrange for bronchoscopy with endobronchial ultrasound, possible right middle lobe biopsies under fluoroscopy.  Tobacco use Working on trying to stop.  She has been able to decrease.  COPD (chronic obstructive pulmonary disease) (HCC) Presumed COPD.  Minimal symptoms.  She does have albuterol available to use as needed and does seem to benefit.  Suspect she may benefit in the future from scheduled bronchodilator therapy.  We will perform full PFT in the future, consider scheduled therapy going forward.   Baltazar Apo, MD, PhD 11/20/2018, 1:38 PM [facility] [phone] or if no answer [phone]

## 2018-11-20 NOTE — Patient Instructions (Signed)
We will arrange for bronchoscopy and biopsies of your right lung abnormalities as soon as possible. Congratulations on decreasing your smoking.  We will try to continue to work to stop altogether. Keep your albuterol available to use 2 puffs if needed for shortness of breath, chest tightness, wheezing.  Going forward we will evaluate further to see if you might benefit from an alternative inhaler every day Follow with Dr Lamonte Sakai in 1 month

## 2018-11-23 ENCOUNTER — Telehealth: Payer: Self-pay | Admitting: Internal Medicine

## 2018-11-23 ENCOUNTER — Inpatient Hospital Stay (HOSPITAL_BASED_OUTPATIENT_CLINIC_OR_DEPARTMENT_OTHER): Payer: 59 | Admitting: Internal Medicine

## 2018-11-23 ENCOUNTER — Telehealth: Payer: Self-pay | Admitting: Medical Oncology

## 2018-11-23 ENCOUNTER — Telehealth: Payer: Self-pay | Admitting: Emergency Medicine

## 2018-11-23 ENCOUNTER — Inpatient Hospital Stay: Payer: 59

## 2018-11-23 ENCOUNTER — Other Ambulatory Visit: Payer: Self-pay

## 2018-11-23 ENCOUNTER — Encounter: Payer: Self-pay | Admitting: Internal Medicine

## 2018-11-23 VITALS — BP 111/63 | HR 65 | Temp 98.3°F | Resp 18 | Ht 61.0 in | Wt 129.2 lb

## 2018-11-23 DIAGNOSIS — Z720 Tobacco use: Secondary | ICD-10-CM

## 2018-11-23 DIAGNOSIS — Z7982 Long term (current) use of aspirin: Secondary | ICD-10-CM | POA: Diagnosis not present

## 2018-11-23 DIAGNOSIS — C3490 Malignant neoplasm of unspecified part of unspecified bronchus or lung: Secondary | ICD-10-CM

## 2018-11-23 DIAGNOSIS — I10 Essential (primary) hypertension: Secondary | ICD-10-CM

## 2018-11-23 DIAGNOSIS — I6782 Cerebral ischemia: Secondary | ICD-10-CM | POA: Diagnosis not present

## 2018-11-23 DIAGNOSIS — J90 Pleural effusion, not elsewhere classified: Secondary | ICD-10-CM | POA: Diagnosis not present

## 2018-11-23 DIAGNOSIS — I700 Atherosclerosis of aorta: Secondary | ICD-10-CM | POA: Diagnosis not present

## 2018-11-23 DIAGNOSIS — F17210 Nicotine dependence, cigarettes, uncomplicated: Secondary | ICD-10-CM | POA: Diagnosis not present

## 2018-11-23 DIAGNOSIS — R918 Other nonspecific abnormal finding of lung field: Secondary | ICD-10-CM | POA: Diagnosis not present

## 2018-11-23 DIAGNOSIS — C781 Secondary malignant neoplasm of mediastinum: Secondary | ICD-10-CM | POA: Diagnosis not present

## 2018-11-23 DIAGNOSIS — C782 Secondary malignant neoplasm of pleura: Secondary | ICD-10-CM | POA: Diagnosis not present

## 2018-11-23 DIAGNOSIS — Z79899 Other long term (current) drug therapy: Secondary | ICD-10-CM | POA: Diagnosis not present

## 2018-11-23 DIAGNOSIS — F329 Major depressive disorder, single episode, unspecified: Secondary | ICD-10-CM | POA: Diagnosis not present

## 2018-11-23 DIAGNOSIS — C3411 Malignant neoplasm of upper lobe, right bronchus or lung: Secondary | ICD-10-CM | POA: Diagnosis not present

## 2018-11-23 LAB — CBC WITH DIFFERENTIAL (CANCER CENTER ONLY)
Abs Immature Granulocytes: 0 10*3/uL (ref 0.00–0.07)
Basophils Absolute: 0.1 10*3/uL (ref 0.0–0.1)
Basophils Relative: 1 %
Eosinophils Absolute: 0.2 10*3/uL (ref 0.0–0.5)
Eosinophils Relative: 3 %
HCT: 36.2 % (ref 36.0–46.0)
Hemoglobin: 12.1 g/dL (ref 12.0–15.0)
Immature Granulocytes: 0 %
Lymphocytes Relative: 33 %
Lymphs Abs: 1.8 10*3/uL (ref 0.7–4.0)
MCH: 31.6 pg (ref 26.0–34.0)
MCHC: 33.4 g/dL (ref 30.0–36.0)
MCV: 94.5 fL (ref 80.0–100.0)
Monocytes Absolute: 0.5 10*3/uL (ref 0.1–1.0)
Monocytes Relative: 10 %
Neutro Abs: 2.8 10*3/uL (ref 1.7–7.7)
Neutrophils Relative %: 53 %
Platelet Count: 344 10*3/uL (ref 150–400)
RBC: 3.83 MIL/uL — ABNORMAL LOW (ref 3.87–5.11)
RDW: 12.8 % (ref 11.5–15.5)
WBC Count: 5.3 10*3/uL (ref 4.0–10.5)
nRBC: 0 % (ref 0.0–0.2)

## 2018-11-23 LAB — CMP (CANCER CENTER ONLY)
ALT: 6 U/L (ref 0–44)
AST: 12 U/L — ABNORMAL LOW (ref 15–41)
Albumin: 3.9 g/dL (ref 3.5–5.0)
Alkaline Phosphatase: 79 U/L (ref 38–126)
Anion gap: 9 (ref 5–15)
BUN: 30 mg/dL — ABNORMAL HIGH (ref 8–23)
CO2: 27 mmol/L (ref 22–32)
Calcium: 11.8 mg/dL — ABNORMAL HIGH (ref 8.9–10.3)
Chloride: 99 mmol/L (ref 98–111)
Creatinine: 0.94 mg/dL (ref 0.44–1.00)
GFR, Est AFR Am: >60 mL/min (ref >60)
GFR, Estimated: >60 mL/min (ref >60)
Glucose, Bld: 97 mg/dL (ref 70–99)
Potassium: 3.6 mmol/L (ref 3.5–5.1)
Sodium: 135 mmol/L (ref 135–145)
Total Bilirubin: 0.8 mg/dL (ref 0.3–1.2)
Total Protein: 7.9 g/dL (ref 6.5–8.1)

## 2018-11-23 NOTE — Progress Notes (Signed)
[id number] [place] - [id number] [id number]: [phone]: [phone]     Your procedure is scheduled on September 2nd.  [facility]., and check in at the Admitting office.  Call this number if you have problems the morning of surgery:  [phone] Call [phone] you have any questions prior to your surgery date Monday-Friday 8am-4pm    Remember:  Do not eat or drink after midnight the night before your surgery   Take these medicines the morning of surgery with A SIP OF WATER   Tylenol - if needed  Amlodipine (Norvasc)  Atorvastatin (Lipitor)  Famotidine (Pepcid)  Gabapentin (Neurontin)  Hydrocodone -Acetaminophen - if needed  Ventolin inhaler - if needed  7 days prior to surgery STOP taking any Aspirin (unless otherwise instructed by your surgeon), Aleve, Naproxen, Ibuprofen, Motrin, Advil, Goody's, BC's, all herbal medications, fish oil, and all vitamins.    The Morning of Surgery  Do not wear jewelry, make-up or nail polish.  Do not wear lotions, powders, or perfumes, or deodorant  Do not shave 48 hours prior to surgery.    Do not bring valuables to the hospital.  [facility] or [facility]ables.  If you are a smoker, DO NOT Smoke 24 hours prior to surgery IF you wear a CPAP at night please bring your mask, tubing, and machine the morning of surgery   Remember that you must have someone to transport you home after your surgery, and remain with you for 24 hours if you are discharged the same day.   Contacts, glasses, hearing aids, dentures or bridgework may not be worn into surgery.    Leave your suitcase in the car.  After surgery it may be brought to your room.  For patients admitted to the hospital, discharge time will be determined by your treatment team.  Patients  discharged the day of surgery will not be allowed to drive home.    Special instructions:   Debra Rodgers- Preparing For Surgery  Before surgery, you can play an important role. Because skin is not sterile, your skin needs to be as free of germs as possible. You can reduce the number of germs on your skin by washing with CHG (chlorahexidine gluconate) Soap before surgery.  CHG is an antiseptic cleaner which kills germs and bonds with the skin to continue killing germs even after washing.    Oral Hygiene is also important to reduce your risk of infection.  Remember - BRUSH YOUR TEETH THE MORNING OF SURGERY WITH YOUR REGULAR TOOTHPASTE  Please do not use if you have an allergy to CHG or antibacterial soaps. If your skin becomes reddened/irritated stop using the CHG.  Do not shave (including legs and underarms) for at least 48 hours prior to first CHG shower. It is OK to shave your face.  Please follow these instructions carefully.   1. Shower the NIGHT BEFORE SURGERY and the MORNING OF SURGERY with CHG Soap.   2. If you chose to wash your hair, wash your hair first as usual with your normal shampoo.  3. After you shampoo, rinse your hair and body thoroughly to remove the shampoo.  4. Use CHG as you would any other liquid soap. You can apply CHG directly to the skin and wash gently with a scrungie or a clean  washcloth.   5. Apply the CHG Soap to your body ONLY FROM THE NECK DOWN.  Do not use on open wounds or open sores. Avoid contact with your eyes, ears, mouth and genitals (private parts). Wash Face and genitals (private parts)  with your normal soap.   6. Wash thoroughly, paying special attention to the area where your surgery will be performed.  7. Thoroughly rinse your body with warm water from the neck down.  8. DO NOT shower/wash with your normal soap after using and rinsing off the CHG Soap.  9. Pat yourself dry with a CLEAN TOWEL.  10. Wear CLEAN PAJAMAS to bed the night before  surgery, wear comfortable clothes the morning of surgery  11. Place CLEAN SHEETS on your bed the night of your first shower and DO NOT SLEEP WITH PETS.    Day of Surgery:  Do not apply any deodorants/lotions. Please shower the morning of surgery with the CHG soap  Please wear clean clothes to the hospital/surgery center.   Remember to brush your teeth WITH YOUR REGULAR TOOTHPASTE.   Please read over the following fact sheets that you were given.

## 2018-11-23 NOTE — Telephone Encounter (Signed)
FMLA papers placed in pick up folder for [facility] staff.

## 2018-11-23 NOTE — Telephone Encounter (Signed)
Debra Rodgers #[id number] for [facility]

## 2018-11-23 NOTE — Telephone Encounter (Signed)
Gave avs and calendar ° °

## 2018-11-23 NOTE — Progress Notes (Signed)
[facility] [phone]   [phone]  OFFICE PROGRESS NOTE  Debra Filler, MD [place] [address]  DIAGNOSIS: Highly suspicious metastatic lung cancer presented with right upper lobe lung mass in addition to pleural-based metastasis and mediastinal lymphadenopathy.  PRIOR THERAPY: None  CURRENT THERAPY: None  INTERVAL HISTORY: Debra Rodgers 73 y.o. female returns to the clinic today for follow-up visit.  The patient is feeling fine today with no concerning complaints except for pain on the right side of the chest.  She denied having any significant shortness of breath but has mild cough with no hemoptysis.  She has no fever or chills.  She denied having any recent weight loss or night sweats.  She has no headache or visual changes.  She has no nausea, vomiting, diarrhea or constipation.  She had several studies performed recently including MRI of the brain as well as PET scan and she is here today for evaluation and discussion of her scan results.  She was also seen by Dr. Lamonte Sakai for consideration of bronchoscopy with endobronchial ultrasound and biopsy.  She is scheduled for the biopsy on 11/25/2018.  MEDICAL HISTORY: Past Medical History:  Diagnosis Date   Depression    Essential hypertension    History of migraine headaches    Tobacco use disorder     ALLERGIES:  is allergic to codeine sulfate and pantoprazole sodium.  MEDICATIONS:  Current Outpatient Medications  Medication Sig Dispense Refill   acetaminophen (TYLENOL) 325 MG tablet Take 2 tablets (650 mg total) by mouth every 6 (six) hours as needed. 30 tablet 0   amLODipine (NORVASC) 10 MG tablet TAKE 1 TABLET BY MOUTH EVERY DAY 30 tablet 5   aspirin 81 MG EC tablet Take 81 mg by mouth daily.       atorvastatin (LIPITOR) 20 MG tablet TAKE 1 TABLET BY MOUTH EVERY DAY 90 tablet 3   chlorthalidone (HYGROTON) 50 MG tablet TAKE 1 TABLET BY MOUTH EVERY  DAY 90 tablet 1   famotidine (PEPCID) 20 MG tablet Take 20 mg by mouth daily.      FLUoxetine (PROZAC) 20 MG capsule TAKE 1 CAPSULE (20 MG TOTAL) DAILY BY MOUTH. 30 capsule 5   gabapentin (NEURONTIN) 100 MG capsule Take 1 capsule (100 mg total) by mouth 2 (two) times daily. 180 capsule 3   HYDROcodone-acetaminophen (NORCO) 7.5-325 MG tablet Take 1 tablet by mouth every 8 (eight) hours as needed for up to 3 days for moderate pain. 9 tablet 0   lisinopril (ZESTRIL) 10 MG tablet TAKE 1 TABLET BY MOUTH EVERY DAY 90 tablet 1   nicotine ([employer] NICOTINE TRANSDERMAL SYS) 14 mg/24hr patch Place 1 patch (14 mg total) onto the skin daily. 42 patch 0   VENTOLIN HFA 108 (90 Base) MCG/ACT inhaler INHALE 1-2 PUFFS INTO THE LUNGS EVERY 6 (SIX) HOURS AS NEEDED FOR WHEEZING OR SHORTNESS OF BREATH. 18 Inhaler 0   No current facility-administered medications for this visit.     SURGICAL HISTORY:  Past Surgical History:  Procedure Laterality Date   ABDOMINAL HYSTERECTOMY     CHOLECYSTECTOMY     ROTATOR CUFF REPAIR  3/04    REVIEW OF SYSTEMS:  Constitutional: positive for fatigue Eyes: negative Ears, nose, mouth, throat, and face: negative Respiratory: positive for cough, dyspnea on exertion and pleurisy/chest pain Cardiovascular: negative Gastrointestinal: negative Genitourinary:negative Integument/breast: negative Hematologic/lymphatic: negative Musculoskeletal:negative Neurological: negative Behavioral/Psych: negative Endocrine: negative Allergic/Immunologic: negative  PHYSICAL EXAMINATION: General appearance: alert, cooperative, fatigued and no distress Head: Normocephalic, without obvious abnormality, atraumatic Neck: no adenopathy, no JVD, supple, symmetrical, trachea midline and thyroid not enlarged, symmetric, no tenderness/mass/nodules Lymph nodes: Cervical, supraclavicular, and axillary nodes normal. Resp: clear to auscultation bilaterally Back: symmetric, no curvature. ROM  normal. No CVA tenderness. Cardio: regular rate and rhythm, S1, S2 normal, no murmur, click, rub or gallop GI: soft, non-tender; bowel sounds normal; no masses,  no organomegaly Extremities: extremities normal, atraumatic, no cyanosis or edema Neurologic: Alert and oriented X 3, normal strength and tone. Normal symmetric reflexes. Normal coordination and gait  ECOG PERFORMANCE STATUS: 1 - Symptomatic but completely ambulatory  Blood pressure 111/63, pulse 65, temperature 98.3 F (36.8 C), temperature source Oral, resp. rate 18, height 5' 1"  (1.549 m), weight 129 lb 3.2 oz (58.6 kg), SpO2 99 %.  LABORATORY DATA: Lab Results  Component Value Date   WBC 5.3 11/23/2018   HGB 12.1 11/23/2018   HCT 36.2 11/23/2018   MCV 94.5 11/23/2018   PLT 344 11/23/2018      Chemistry      Component Value Date/Time   NA 136 11/04/2018 1820   NA 139 07/21/2017 0958   K 3.3 (L) 11/04/2018 1820   CL 98 11/04/2018 1820   CO2 25 11/04/2018 1820   BUN 25 (H) 11/04/2018 1820   BUN 26 07/21/2017 0958   CREATININE 0.95 11/04/2018 1820   CREATININE 0.79 09/03/2013 1540      Component Value Date/Time   CALCIUM 10.6 (H) 11/04/2018 1820   ALKPHOS 71 11/04/2018 1820   AST 14 (L) 11/04/2018 1820   ALT 9 11/04/2018 1820   BILITOT 0.5 11/04/2018 1820       RADIOGRAPHIC STUDIES: Dg Ribs Unilateral W/chest Left  Result Date: 11/05/2018 CLINICAL DATA:  73 year old female status post fall with chest pain greater on the left. EXAM: LEFT RIBS AND CHEST - 3+ VIEW COMPARISON:  Chest radiographs 02/26/2013. left rib series 03/21/2017. FINDINGS: PA view of the chest demonstrates confluent abnormal right lung base opacity. There is stable chronic elevation of the left hemidiaphragm. Tortuous thoracic aorta. Other mediastinal contours are within normal limits. Visualized tracheal air column is within normal limits. No pneumothorax or pulmonary edema. Questionable small right pleural effusion. No left rib fracture  identified. Other visible osseous structures appear intact. Prior lower cervical ACDF. Negative visible bowel gas pattern. IMPRESSION: 1. Confluent abnormal opacity at the right lung base, nonspecific, and with possible associated small right pleural effusion. If there are no signs/symptoms of infection then recommend Chest CT (IV contrast preferred) to further characterize. If pneumonia is suspected then Followup PA and lateral chest X-ray is recommended in 3-4 weeks following trial of antibiotic therapy. 2. No left rib fracture or other acute cardiopulmonary abnormality. Electronically Signed   By: Genevie Ann M.D.   On: 11/05/2018 00:18   Ct Chest W Contrast  Result Date: 11/07/2018 CLINICAL DATA:  On yesterday's abdomen CT, report described RIGHT middle lobe pleural based mass and multiple additional smaller pulmonary nodules consistent with metastatic disease. CT report also described diffuse thickening of the RIGHT pleural with pleural based nodule/masses consistent with metastatic implants. Small RIGHT pleural effusion was also noted. Dedicated chest CT was recommended. EXAM: CT CHEST WITH CONTRAST TECHNIQUE: Multidetector CT imaging of the chest was performed during intravenous contrast administration. CONTRAST:  20m OMNIPAQUE IOHEXOL 300 MG/ML  SOLN COMPARISON:  CT abdomen from yesterday. FINDINGS: Cardiovascular: Scattered aortic atherosclerosis. No thoracic aortic aneurysm. Heart  size is within normal limits. No pericardial effusion. Mediastinum/Nodes: Esophagus is not well seen in its entirety but visualized portions are unremarkable. Conglomerate lymphadenopathy within the RIGHT lower paratracheal space and subcarinal space, with short axis measurements of 2.3 cm and 2.4 cm respectively. Trachea is unremarkable. Lungs/Pleura: RIGHT middle lobe mass measures 4.1 cm, pleural based as described on earlier abdomen CT report. Smaller scattered pleural based nodules along the RIGHT major fissure and minor  fissure, almost certainly additional metastatic implants. Also again noted is irregular pleural thickening, and presumed pleural based mass, at the RIGHT lung base posteriorly, with associated small pleural effusion. LEFT lung is clear. Emphysematous changes at the lung apices, mild to moderate in degree, with associated emphysematous blebs. Upper Abdomen: No acute findings on limited images of the upper abdomen. Musculoskeletal: Advanced degenerative change within the lower cervical spine, incompletely imaged. Additional degenerative spondylosis within the upper and lower portions of the slightly scoliotic thoracic spine. No acute appearing osseous abnormality. IMPRESSION: 1. RIGHT middle lobe pleural based mass measures 4.1 cm greatest dimension, as described on yesterday's abdomen CT report, consistent with primary carcinoma versus metastatic disease. 2. Smaller scattered pleural based nodules along the RIGHT major fissure and minor fissure, almost certainly additional metastatic implants. 3. Irregular pleural thickening along the posterior and posterior-medial aspects of the RIGHT lung, lower lobe predominant, with associated loculated effusion, almost certainly representing additional metastatic disease. 4. Conglomerate lymphadenopathy within the RIGHT lower paratracheal space and subcarinal space, with short axis measurements of 2.3 cm and 2.4 cm respectively, compatible with nodal metastases. 5. Emphysematous changes at the lung apices, mild to moderate in degree, with associated emphysematous blebs. 6. LEFT lung is clear. 7. Aortic atherosclerosis. Aortic Atherosclerosis (ICD10-I70.0) and Emphysema (ICD10-J43.9). Electronically Signed   By: Franki Cabot M.D.   On: 11/07/2018 13:47   Mr Jeri Cos WC Contrast  Result Date: 11/19/2018 CLINICAL DATA:  Lung cancer staging EXAM: MRI HEAD WITHOUT AND WITH CONTRAST TECHNIQUE: Multiplanar, multiecho pulse sequences of the brain and surrounding structures were  obtained without and with intravenous contrast. CONTRAST:  6 mL Gadavist COMPARISON:  Brain MRI 10/06/2013 FINDINGS: BRAIN: There is no acute infarct, acute hemorrhage or extra-axial collection. Multifocal white matter hyperintensity, most commonly due to chronic ischemic microangiopathy. The cerebral and cerebellar volume are age-appropriate. There is no hydrocephalus. The midline structures are normal. There is no abnormal contrast enhancement. VASCULAR: The major intracranial arterial and venous sinus flow voids are normal. Susceptibility-sensitive sequences show no chronic microhemorrhage or superficial siderosis. SKULL AND UPPER CERVICAL SPINE: Calvarial bone marrow signal is normal. There is no skull base mass. The visualized upper cervical spine and soft tissues are normal. SINUSES/ORBITS: There are no fluid levels or advanced mucosal thickening. The mastoid air cells and middle ear cavities are free of fluid. The orbits are normal. IMPRESSION: 1. No intracranial or calvarial metastatic disease. 2. Chronic small vessel ischemia. Electronically Signed   By: Ulyses Jarred M.D.   On: 11/19/2018 20:19   Ct Abdomen Pelvis W Contrast  Result Date: 11/05/2018 CLINICAL DATA:  73 year old female with abdominal distention and epigastric and flank pain. Patient fell 2 weeks ago. EXAM: CT ABDOMEN AND PELVIS WITH CONTRAST TECHNIQUE: Multidetector CT imaging of the abdomen and pelvis was performed using the standard protocol following bolus administration of intravenous contrast. CONTRAST:  145m OMNIPAQUE IOHEXOL 300 MG/ML  SOLN COMPARISON:  CT of the abdomen pelvis dated 02/18/2017 and left rib radiograph dated 11/05/2018 FINDINGS: Lower chest: Partially visualized 3.3 x 2.5  cm pleural based mass in the right middle lobe. Several additional smaller nodules noted in the right lung base. There is thickening and nodularity of the right lower pleural surface with multiple pleural implants/masses consistent with  metastatic disease. There is a small right pleural effusion. Dedicated chest CT with contrast is recommended for evaluation of the lungs. There is no intra-abdominal free air or free fluid. Hepatobiliary: Several subcentimeter hepatic hypodense foci measure up to 9 mm. These lesions are too small to characterize but appears similar to prior CT and not appear to demonstrate enhancement, likely benign etiology such as cyst or hemangiomata. There is mild intrahepatic biliary ductal dilatation. The gallbladder is not visualized, likely surgically absent. No retained calcified stone noted in the central CBD. Pancreas: Unremarkable. No pancreatic ductal dilatation or surrounding inflammatory changes. Spleen: Normal in size without focal abnormality. Adrenals/Urinary Tract: The adrenal glands are unremarkable. There is no hydronephrosis on either side. There is symmetric enhancement and excretion of contrast by both kidneys. The urinary bladder is partially distended and grossly unremarkable. Stomach/Bowel: There is sigmoid diverticulosis without active inflammatory changes. There is no bowel obstruction or active inflammation. The appendix is not visualized with certainty. No inflammatory changes identified in the right lower quadrant. Vascular/Lymphatic: There is moderate aortoiliac atherosclerotic disease. 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 No aortic dissection. No periaortic inflammation or contrast extravasation. The origins of the celiac axis, SMA, IMA and the renal arteries appear patent. The SMV, splenic vein, and main portal vein are patent. No portal venous gas. There is no adenopathy. Reproductive: Hysterectomy. There is a 6.3 cm left adnexal cyst. Further evaluation with pelvic ultrasound on a nonemergent basis recommended. Other: Midline vertical anterior pelvic wall incisional scar. No fluid collection. Musculoskeletal: Multilevel degenerative changes of the spine with disc  desiccation and vacuum phenomena. No acute osseous pathology. No suspicious osseous lesions. IMPRESSION: 1. No acute intra-abdominal or pelvic pathology. 2. Partially visualized right middle lobe pleural-based mass with multiple additional smaller pulmonary nodules consistent with metastatic disease. Diffuse thickening of the right pleural with pleural-based nodules/masses consistent with metastatic implant. A small right pleural effusion is also noted. Dedicated chest CT with contrast is recommended for evaluation of the lungs. 3. 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 Recommend followup by ultrasound in 2 years. This recommendation follows ACR consensus guidelines: White Paper of the ACR Incidental Findings Committee II on Vascular Findings. J Am Coll Radiol 2013; 10:789-794. Aortic aneurysm NOS (ICD10-I71.9) 4. Sigmoid diverticulosis. No bowel obstruction or active inflammation. 5. A 6.3 cm left adnexal cyst. Further evaluation with pelvic ultrasound on a nonemergent basis recommended. 6. Aortic Atherosclerosis (ICD10-I70.0). Electronically Signed   By: Anner Crete M.D.   On: 11/05/2018 01:16   Nm Pet Image Initial (pi) Skull Base To Thigh  Result Date: 11/18/2018 CLINICAL DATA:  Initial treatment strategy for right-sided pulmonary and pleural lesions suspicious for primary bronchogenic carcinoma and metastasis. Thoracic adenopathy. EXAM: NUCLEAR MEDICINE PET SKULL BASE TO THIGH TECHNIQUE: 6.1 mCi F-18 FDG was injected intravenously. Full-ring PET imaging was performed from the skull base to thigh after the radiotracer. CT data was obtained and used for attenuation correction and anatomic localization. Fasting blood glucose: 85 mg/dl COMPARISON:  Chest CT 11/06/2018.  Abdominopelvic CT 11/05/2018. FINDINGS: Mediastinal blood pool activity: SUV max 2.2 Liver activity: SUV max NA NECK: No areas of abnormal hypermetabolism. Incidental CT findings: Apparent right nasopharyngeal soft tissue  fullness is favored to be due to obliquity, without hypermetabolic  correlate. No cervical adenopathy. Bilateral carotid atherosclerosis. CHEST: Irregular right-sided pleural thickening and hypermetabolism. Example area of diffuse thickening posteriorly and medially at 1.3 cm corresponding to hypermetabolism at a S.U.V. max of 13.1. Example image 53/4. A probable pulmonary parenchymal lesion in the right middle lobe measures 3.7 x 4.0 cm and a S.U.V. max of 15.9 on 76/4. Extensive right-sided mediastinal adenopathy. Example right paratracheal node of 2.4 cm and a S.U.V. max of 18.8 on 54/4. Subcarinal adenopathy at 1.9 cm and a S.U.V. max of 17.7. Incidental CT findings: Deferred to recent diagnostic CT. Centrilobular and paraseptal emphysema. Aortic and coronary artery atherosclerosis. ABDOMEN/PELVIS: No abdominopelvic parenchymal or nodal hypermetabolism. Incidental CT findings: Deferred to abdominopelvic CT. Normal adrenal glands. Left pelvic, likely ovarian cystic lesion at 5.7 cm, as on prior CT. SKELETON: No abnormal marrow activity. Incidental CT findings: Cervical spine fixation. IMPRESSION: 1. Extensive right-sided pleural and thoracic nodal hypermetabolic disease, as detailed above. Concurrent likely parenchymal right middle lobe hypermetabolic lung mass. Favor primary bronchogenic carcinoma with pleural and nodal metastasis. Mesothelioma could look similar, but is felt less likely, given absence of asbestos related pleural disease. 2. No evidence of extrathoracic hypermetabolic metastasis. 3. Aortic atherosclerosis (ICD10-I70.0), coronary artery atherosclerosis and emphysema (ICD10-J43.9). 4. Left pelvic/ovarian cystic lesion, as on diagnostic CT. Electronically Signed   By: Abigail Miyamoto M.D.   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  She presented with  extensive right-sided pleural and thoracic nodal hypermetabolic disease with no extrathoracic disease. I personally and independently reviewed the PET scan images and discussed the results with the patient today. I recommended for her to proceed with the bronchoscopy and endobronchial ultrasound and biopsy as planned by Dr. Lamonte Sakai in 2 days. I will arrange for the patient to come back for follow-up visit next week for evaluation and more detailed discussion of her treatment options based on the final pathology. The patient was advised to call immediately if she has any concerning symptoms in the interval. She will continue her other home medication. The patient voices understanding of current disease status and treatment options and is in agreement with the current care plan.  All questions were answered. The patient knows to call the clinic with any problems, questions or concerns. We can certainly see the patient much sooner if necessary.  I spent 15 minutes counseling the patient face to face. The total time spent in the appointment was 25 minutes.  Disclaimer: This note was dictated with voice recognition software. Similar sounding words can inadvertently be transcribed and may not be corrected upon review.

## 2018-11-24 ENCOUNTER — Other Ambulatory Visit (HOSPITAL_COMMUNITY)
Admission: RE | Admit: 2018-11-24 | Discharge: 2018-11-24 | Disposition: A | Discharge status: Home / Self Care | Payer: 59 | Source: Ambulatory Visit | Attending: Emergency Medicine | Admitting: Emergency Medicine

## 2018-11-24 ENCOUNTER — Encounter (HOSPITAL_COMMUNITY): Payer: Self-pay

## 2018-11-24 ENCOUNTER — Encounter (HOSPITAL_COMMUNITY)
Admission: RE | Admit: 2018-11-24 | Discharge: 2018-11-24 | Disposition: A | Discharge status: Home / Self Care | Payer: 59 | Source: Ambulatory Visit | Attending: Emergency Medicine | Admitting: Emergency Medicine

## 2018-11-24 DIAGNOSIS — M1990 Unspecified osteoarthritis, unspecified site: Secondary | ICD-10-CM | POA: Diagnosis not present

## 2018-11-24 DIAGNOSIS — Z888 Allergy status to other drugs, medicaments and biological substances status: Secondary | ICD-10-CM | POA: Diagnosis not present

## 2018-11-24 DIAGNOSIS — R918 Other nonspecific abnormal finding of lung field: Secondary | ICD-10-CM | POA: Diagnosis not present

## 2018-11-24 DIAGNOSIS — Z01812 Encounter for preprocedural laboratory examination: Secondary | ICD-10-CM | POA: Insufficient documentation

## 2018-11-24 DIAGNOSIS — F17210 Nicotine dependence, cigarettes, uncomplicated: Secondary | ICD-10-CM | POA: Diagnosis not present

## 2018-11-24 DIAGNOSIS — Z20828 Contact with and (suspected) exposure to other viral communicable diseases: Secondary | ICD-10-CM | POA: Diagnosis not present

## 2018-11-24 DIAGNOSIS — F329 Major depressive disorder, single episode, unspecified: Secondary | ICD-10-CM | POA: Diagnosis not present

## 2018-11-24 DIAGNOSIS — I10 Essential (primary) hypertension: Secondary | ICD-10-CM | POA: Diagnosis not present

## 2018-11-24 DIAGNOSIS — R590 Localized enlarged lymph nodes: Secondary | ICD-10-CM | POA: Diagnosis not present

## 2018-11-24 DIAGNOSIS — Z79899 Other long term (current) drug therapy: Secondary | ICD-10-CM | POA: Diagnosis not present

## 2018-11-24 DIAGNOSIS — K219 Gastro-esophageal reflux disease without esophagitis: Secondary | ICD-10-CM | POA: Diagnosis not present

## 2018-11-24 DIAGNOSIS — G43909 Migraine, unspecified, not intractable, without status migrainosus: Secondary | ICD-10-CM | POA: Diagnosis not present

## 2018-11-24 DIAGNOSIS — Z7982 Long term (current) use of aspirin: Secondary | ICD-10-CM | POA: Diagnosis not present

## 2018-11-24 DIAGNOSIS — J449 Chronic obstructive pulmonary disease, unspecified: Secondary | ICD-10-CM | POA: Diagnosis not present

## 2018-11-24 DIAGNOSIS — Z885 Allergy status to narcotic agent status: Secondary | ICD-10-CM | POA: Diagnosis not present

## 2018-11-24 DIAGNOSIS — C3401 Malignant neoplasm of right main bronchus: Secondary | ICD-10-CM | POA: Diagnosis not present

## 2018-11-24 DIAGNOSIS — Z8249 Family history of ischemic heart disease and other diseases of the circulatory system: Secondary | ICD-10-CM | POA: Diagnosis not present

## 2018-11-24 HISTORY — DX: Chronic obstructive pulmonary disease, unspecified: J44.9

## 2018-11-24 HISTORY — DX: Sleep apnea, unspecified: G47.30

## 2018-11-24 HISTORY — DX: Headache, unspecified: R51.9

## 2018-11-24 LAB — PROTIME-INR
INR: 1 (ref 0.8–1.2)
Prothrombin Time: 13.4 seconds (ref 11.4–15.2)

## 2018-11-24 LAB — SARS CORONAVIRUS 2 (TAT 6-24 HRS): SARS Coronavirus 2: NEGATIVE

## 2018-11-24 LAB — APTT: aPTT: 28 seconds (ref 24–36)

## 2018-11-24 NOTE — Anesthesia Preprocedure Evaluation (Addendum)
Anesthesia Evaluation  Patient identified by MRN, date of birth, ID band Patient awake    Reviewed: Allergy & Precautions, H&P , NPO status , Patient's Chart, lab work & pertinent test results  Airway Mallampati: II  TM Distance: >3 FB Neck ROM: Full    Dental no notable dental hx. (+) Teeth Intact, Dental Advisory Given   Pulmonary sleep apnea , COPD,  COPD inhaler, Current Smoker and Patient abstained from smoking.,    Pulmonary exam normal breath sounds clear to auscultation       Cardiovascular hypertension, Pt. on medications negative cardio ROS   Rhythm:Regular Rate:Normal     Neuro/Psych  Headaches, Depression negative neurological ROS     GI/Hepatic Neg liver ROS, GERD  Medicated and Controlled,  Endo/Other  negative endocrine ROS  Renal/GU negative Renal ROS  negative genitourinary   Musculoskeletal  (+) Arthritis , Osteoarthritis,    Abdominal   Peds  Hematology negative hematology ROS (+)   Anesthesia Other Findings   Reproductive/Obstetrics negative OB ROS                           Anesthesia Physical Anesthesia Plan  ASA: III  Anesthesia Plan: General   Post-op Pain Management:    Induction: Intravenous  PONV Risk Score and Plan: 2 and Ondansetron and Dexamethasone  Airway Management Planned: Oral ETT  Additional Equipment:   Intra-op Plan:   Post-operative Plan: Extubation in OR  Informed Consent: I have reviewed the patients History and Physical, chart, labs and discussed the procedure including the risks, benefits and alternatives for the proposed anesthesia with the patient or authorized representative who has indicated his/her understanding and acceptance.     Dental advisory given  Plan Discussed with: CRNA  Anesthesia Plan Comments: (PAT note written 11/24/2018 by [name], PA-C. )       Anesthesia Quick Evaluation

## 2018-11-24 NOTE — Progress Notes (Signed)
Anesthesia Chart Review:  Case: [id number] Date/Time: 11/25/18 1015   Procedure: VIDEO BRONCHOSCOPY WITH ENDOBRONCHIAL ULTRASOUND (Right )   Anesthesia type: General   Pre-op diagnosis: LUNG MASS RIGHT MIDDLE LOBE   Location: [facility] OR ROOM 14 / [place] [facility]   Surgeon: Collene Gobble, MD      DISCUSSION: Patient is a 73 year old female scheduled for the above procedure. Recent imaging concerning for stage IV lung cancer, but tissue diagnosis needed to confirm and guide management recommendations.  History includes smoking, hypertension, COPD, OSA, migraines.  11/24/18 COVID-19 test is still in process. If negative and otherwise no acute changes then I would anticipate that he can proceed as planned.   VS: BP 124/80   Pulse 63   Temp 36.7 C   Resp 18   Ht 5' 1"  (1.549 m)   Wt 58.6 kg   SpO2 100%   BMI 24.39 kg/m     PROVIDERS: [name], MD is PCP Curt Bears, MD is HEM[facility]  LABS: Most recent labs as of 11/24/18 include: Lab Results  Component Value Date   WBC 5.3 11/23/2018   HGB 12.1 11/23/2018   HCT 36.2 11/23/2018   PLT 344 11/23/2018   GLUCOSE 97 11/23/2018   ALT 6 11/23/2018   AST 12 (L) 11/23/2018   NA 135 11/23/2018   K 3.6 11/23/2018   CL 99 11/23/2018   CREATININE 0.94 11/23/2018   BUN 30 (H) 11/23/2018   CO2 27 11/23/2018   INR 1.0 11/24/2018     PFTs 10/09/15: FVC 1.40 (67%), post 1.45 (69%), FEV1 1.01 (62%), post 1.02 (63%), DLCO unc 11.27 (55%).   IMAGES: PET scan 11/18/18: IMPRESSION: 1. Extensive right-sided pleural and thoracic nodal hypermetabolic disease, as detailed above. Concurrent likely parenchymal right middle lobe hypermetabolic lung mass. Favor primary bronchogenic carcinoma with pleural and nodal metastasis. Mesothelioma could look similar, but is felt less likely, given absence of asbestos related pleural disease. 2. No evidence of extrathoracic hypermetabolic metastasis. 3. Aortic atherosclerosis (ICD10-I70.0), coronary  artery atherosclerosis and emphysema (ICD10-J43.9). 4. Left pelvic/ovarian cystic lesion, as on diagnostic CT.   EKG: 11/24/18: Normal sinus rhythm Possible Left atrial enlargement Left ventricular hypertrophy - Overall, I think EKG is stable when compared to 04/17/15 tracing   CV: Exercise tolerance test 05/30/15: Study Highlights  Fair exercise capacity.  Patient walked for 4:59 and achieved a peak HR of 134 (88% predicted max HR )  There were no ST or T wave changes to suggest ischemia.  Rare PVCs in the recovery phase.  Negative GXT   Past Medical History:  Diagnosis Date  . COPD (chronic obstructive pulmonary disease) ([place])   . Depression   . Essential hypertension   . Headache   . History of migraine headaches   . Sleep apnea   . Tobacco use disorder     Past Surgical History:  Procedure Laterality Date  . ABDOMINAL HYSTERECTOMY    . CHOLECYSTECTOMY    . ROTATOR CUFF REPAIR  3/04    MEDICATIONS: . acetaminophen (TYLENOL) 325 MG tablet  . amLODipine (NORVASC) 10 MG tablet  . aspirin 81 MG EC tablet  . atorvastatin (LIPITOR) 20 MG tablet  . chlorthalidone (HYGROTON) 50 MG tablet  . famotidine (PEPCID) 20 MG tablet  . FLUoxetine (PROZAC) 20 MG capsule  . gabapentin (NEURONTIN) 100 MG capsule  . lisinopril (ZESTRIL) 10 MG tablet  . nicotine ([employer] NICOTINE TRANSDERMAL SYS) 14 mg/24hr patch  . VENTOLIN HFA 108 (90 Base) MCG/ACT inhaler  No current facility-administered medications for this encounter.      [name], PA-C Surgical Short Stay/Anesthesiology [facility] Phone [phone] [facility] Phone [phone] 11/24/2018 2:15 PM

## 2018-11-24 NOTE — Progress Notes (Signed)
PCP - DR benson    With [facility] Cardiologist - na   EKG - 9/20 Stress Test - 3/17 ECHO - na Cardiac Cath - na  Sleep Study - yes CPAP - yes   -    s: Aspirin Instructions:  Stop today  Anesthesia review: htn review Patient denies shortness of breath, fever, cough and chest pain at PAT appointment   Patient verbalized understanding of instructions that were given to them at the PAT appointment. Patient was also instructed that they will need to review over the PAT instructions again at home before surgery.

## 2018-11-25 ENCOUNTER — Encounter (HOSPITAL_COMMUNITY): Payer: Self-pay

## 2018-11-25 ENCOUNTER — Encounter (HOSPITAL_COMMUNITY)
Admission: RE | Disposition: A | Discharge status: Home / Self Care | Payer: Self-pay | Source: Home / Self Care | Attending: Emergency Medicine

## 2018-11-25 ENCOUNTER — Ambulatory Visit (HOSPITAL_COMMUNITY): Payer: 59 | Admitting: Certified Registered Nurse Anesthetist

## 2018-11-25 ENCOUNTER — Ambulatory Visit (HOSPITAL_COMMUNITY)
Admission: RE | Admit: 2018-11-25 | Discharge: 2018-11-25 | Disposition: A | Discharge status: Home / Self Care | Payer: 59 | Attending: Emergency Medicine | Admitting: Emergency Medicine

## 2018-11-25 ENCOUNTER — Ambulatory Visit (HOSPITAL_COMMUNITY): Payer: 59 | Admitting: Vascular Surgery

## 2018-11-25 ENCOUNTER — Other Ambulatory Visit: Payer: Self-pay

## 2018-11-25 DIAGNOSIS — G43909 Migraine, unspecified, not intractable, without status migrainosus: Secondary | ICD-10-CM | POA: Insufficient documentation

## 2018-11-25 DIAGNOSIS — Z720 Tobacco use: Secondary | ICD-10-CM | POA: Diagnosis present

## 2018-11-25 DIAGNOSIS — K219 Gastro-esophageal reflux disease without esophagitis: Secondary | ICD-10-CM | POA: Insufficient documentation

## 2018-11-25 DIAGNOSIS — J449 Chronic obstructive pulmonary disease, unspecified: Secondary | ICD-10-CM | POA: Insufficient documentation

## 2018-11-25 DIAGNOSIS — R590 Localized enlarged lymph nodes: Secondary | ICD-10-CM | POA: Diagnosis not present

## 2018-11-25 DIAGNOSIS — R918 Other nonspecific abnormal finding of lung field: Secondary | ICD-10-CM | POA: Diagnosis not present

## 2018-11-25 DIAGNOSIS — Z79899 Other long term (current) drug therapy: Secondary | ICD-10-CM | POA: Insufficient documentation

## 2018-11-25 DIAGNOSIS — Z7982 Long term (current) use of aspirin: Secondary | ICD-10-CM | POA: Insufficient documentation

## 2018-11-25 DIAGNOSIS — C3401 Malignant neoplasm of right main bronchus: Secondary | ICD-10-CM | POA: Insufficient documentation

## 2018-11-25 DIAGNOSIS — Z885 Allergy status to narcotic agent status: Secondary | ICD-10-CM | POA: Insufficient documentation

## 2018-11-25 DIAGNOSIS — F329 Major depressive disorder, single episode, unspecified: Secondary | ICD-10-CM | POA: Insufficient documentation

## 2018-11-25 DIAGNOSIS — Z888 Allergy status to other drugs, medicaments and biological substances status: Secondary | ICD-10-CM | POA: Insufficient documentation

## 2018-11-25 DIAGNOSIS — I10 Essential (primary) hypertension: Secondary | ICD-10-CM | POA: Insufficient documentation

## 2018-11-25 DIAGNOSIS — F17210 Nicotine dependence, cigarettes, uncomplicated: Secondary | ICD-10-CM | POA: Insufficient documentation

## 2018-11-25 DIAGNOSIS — M1990 Unspecified osteoarthritis, unspecified site: Secondary | ICD-10-CM | POA: Insufficient documentation

## 2018-11-25 DIAGNOSIS — Z8249 Family history of ischemic heart disease and other diseases of the circulatory system: Secondary | ICD-10-CM | POA: Insufficient documentation

## 2018-11-25 HISTORY — PX: VIDEO BRONCHOSCOPY WITH ENDOBRONCHIAL ULTRASOUND: SHX6177

## 2018-11-25 SURGERY — BRONCHOSCOPY, WITH EBUS
Anesthesia: General | Site: Bronchus | Laterality: Right

## 2018-11-25 MED ORDER — ROCURONIUM BROMIDE 10 MG/ML (PF) SYRINGE
PREFILLED_SYRINGE | INTRAVENOUS | Status: DC | PRN
  Administered 2018-11-25: 30 mg via INTRAVENOUS

## 2018-11-25 MED ORDER — 0.9 % SODIUM CHLORIDE (POUR BTL) OPTIME
TOPICAL | Status: DC | PRN
  Administered 2018-11-25: 1000 mL

## 2018-11-25 MED ORDER — ROCURONIUM BROMIDE 10 MG/ML (PF) SYRINGE
PREFILLED_SYRINGE | INTRAVENOUS | Status: AC
  Filled 2018-11-25: qty 20

## 2018-11-25 MED ORDER — LIDOCAINE 2% (20 MG/ML) 5 ML SYRINGE
INTRAMUSCULAR | Status: AC
  Filled 2018-11-25: qty 10

## 2018-11-25 MED ORDER — LACTATED RINGERS IV SOLN
INTRAVENOUS | Status: DC
  Administered 2018-11-25: 09:00:00 via INTRAVENOUS

## 2018-11-25 MED ORDER — FENTANYL CITRATE (PF) 250 MCG/5ML IJ SOLN
INTRAMUSCULAR | Status: DC | PRN
  Administered 2018-11-25: 50 ug via INTRAVENOUS

## 2018-11-25 MED ORDER — DEXAMETHASONE SODIUM PHOSPHATE 10 MG/ML IJ SOLN
INTRAMUSCULAR | Status: DC | PRN
  Administered 2018-11-25: 10 mg via INTRAVENOUS

## 2018-11-25 MED ORDER — PROPOFOL 10 MG/ML IV BOLUS
INTRAVENOUS | Status: DC | PRN
  Administered 2018-11-25: 100 mg via INTRAVENOUS

## 2018-11-25 MED ORDER — ONDANSETRON HCL 4 MG/2ML IJ SOLN
INTRAMUSCULAR | Status: AC
  Filled 2018-11-25: qty 2

## 2018-11-25 MED ORDER — DEXAMETHASONE SODIUM PHOSPHATE 10 MG/ML IJ SOLN
INTRAMUSCULAR | Status: AC
  Filled 2018-11-25: qty 1

## 2018-11-25 MED ORDER — LIDOCAINE 2% (20 MG/ML) 5 ML SYRINGE
INTRAMUSCULAR | Status: AC
  Filled 2018-11-25: qty 5

## 2018-11-25 MED ORDER — ONDANSETRON HCL 4 MG/2ML IJ SOLN
INTRAMUSCULAR | Status: DC | PRN
  Administered 2018-11-25: 4 mg via INTRAVENOUS

## 2018-11-25 MED ORDER — PROPOFOL 10 MG/ML IV BOLUS
INTRAVENOUS | Status: AC
  Filled 2018-11-25: qty 20

## 2018-11-25 MED ORDER — SUCCINYLCHOLINE CHLORIDE 20 MG/ML IJ SOLN
INTRAMUSCULAR | Status: DC | PRN
  Administered 2018-11-25: 80 mg via INTRAVENOUS

## 2018-11-25 MED ORDER — PHENYLEPHRINE 40 MCG/ML (10ML) SYRINGE FOR IV PUSH (FOR BLOOD PRESSURE SUPPORT)
PREFILLED_SYRINGE | INTRAVENOUS | Status: AC
  Filled 2018-11-25: qty 10

## 2018-11-25 MED ORDER — ROCURONIUM BROMIDE 10 MG/ML (PF) SYRINGE
PREFILLED_SYRINGE | INTRAVENOUS | Status: AC
  Filled 2018-11-25: qty 10

## 2018-11-25 MED ORDER — FENTANYL CITRATE (PF) 250 MCG/5ML IJ SOLN
INTRAMUSCULAR | Status: AC
  Filled 2018-11-25: qty 5

## 2018-11-25 MED ORDER — SUCCINYLCHOLINE CHLORIDE 200 MG/10ML IV SOSY
PREFILLED_SYRINGE | INTRAVENOUS | Status: AC
  Filled 2018-11-25: qty 30

## 2018-11-25 MED ORDER — LIDOCAINE 2% (20 MG/ML) 5 ML SYRINGE
INTRAMUSCULAR | Status: DC | PRN
  Administered 2018-11-25: 60 mg via INTRAVENOUS

## 2018-11-25 MED ORDER — SUGAMMADEX SODIUM 200 MG/2ML IV SOLN
INTRAVENOUS | Status: DC | PRN
  Administered 2018-11-25: 150 mg via INTRAVENOUS
  Administered 2018-11-25: 100 mg via INTRAVENOUS

## 2018-11-25 SURGICAL SUPPLY — 31 items
ADAPTER VALVE BIOPSY EBUS (MISCELLANEOUS) IMPLANT
ADPTR VALVE BIOPSY EBUS (MISCELLANEOUS)
BRUSH CYTOL CELLEBRITY 1.5X140 (MISCELLANEOUS) IMPLANT
CANISTER SUCT 3000ML PPV (MISCELLANEOUS) ×2 IMPLANT
CONT SPEC 4OZ CLIKSEAL STRL BL (MISCELLANEOUS) ×2 IMPLANT
COVER BACK TABLE 60X90IN (DRAPES) ×2 IMPLANT
FORCEPS BIOP RJ4 1.8 (CUTTING FORCEPS) IMPLANT
GAUZE SPONGE 4X4 12PLY STRL (GAUZE/BANDAGES/DRESSINGS) ×2 IMPLANT
GLOVE BIO SURGEON STRL SZ7.5 (GLOVE) ×2 IMPLANT
GOWN STRL REUS W/ TWL LRG LVL3 (GOWN DISPOSABLE) ×1 IMPLANT
GOWN STRL REUS W/TWL LRG LVL3 (GOWN DISPOSABLE) ×1
KIT CLEAN ENDO COMPLIANCE (KITS) ×4 IMPLANT
KIT TURNOVER KIT B (KITS) ×2 IMPLANT
MARKER SKIN DUAL TIP RULER LAB (MISCELLANEOUS) ×2 IMPLANT
NEEDLE ASPIRATION VIZISHOT 19G (NEEDLE) ×2 IMPLANT
NEEDLE ASPIRATION VIZISHOT 21G (NEEDLE) IMPLANT
NS IRRIG 1000ML POUR BTL (IV SOLUTION) ×2 IMPLANT
OIL SILICONE PENTAX (PARTS (SERVICE/REPAIRS)) ×2 IMPLANT
PAD ARMBOARD 7.5X6 YLW CONV (MISCELLANEOUS) IMPLANT
SYR 20ML ECCENTRIC (SYRINGE) ×4 IMPLANT
SYR 20ML LL LF (SYRINGE) ×4 IMPLANT
SYR 50ML SLIP (SYRINGE) ×2 IMPLANT
SYR 5ML LUER SLIP (SYRINGE) ×2 IMPLANT
TOWEL GREEN STERILE FF (TOWEL DISPOSABLE) ×2 IMPLANT
TRAP SPECIMEN MUCOUS 40CC (MISCELLANEOUS) ×4 IMPLANT
TUBE CONNECTING 20X1/4 (TUBING) ×4 IMPLANT
UNDERPAD 30X30 (UNDERPADS AND DIAPERS) ×2 IMPLANT
VALVE BIOPSY  SINGLE USE (MISCELLANEOUS) ×1
VALVE BIOPSY SINGLE USE (MISCELLANEOUS) ×1 IMPLANT
VALVE SUCTION BRONCHIO DISP (MISCELLANEOUS) ×2 IMPLANT
WATER STERILE IRR 1000ML POUR (IV SOLUTION) ×2 IMPLANT

## 2018-11-25 NOTE — Transfer of Care (Signed)
Immediate Anesthesia Transfer of Care Note  Patient: Debra Rodgers  Procedure(s) Performed: VIDEO BRONCHOSCOPY WITH ENDOBRONCHIAL ULTRASOUND (Right Bronchus)  Patient Location: PACU  Anesthesia Type:General  Level of Consciousness: drowsy and patient cooperative  Airway & Oxygen Therapy: Patient Spontanous Breathing  Post-op Assessment: Report given to RN, Post -op Vital signs reviewed and stable and Patient moving all extremities X 4  Post vital signs: Reviewed and stable  Last Vitals:  Vitals Value Taken Time  BP 133/76 11/25/18 1221  Temp 36.8 C 11/25/18 1221  Pulse 91 11/25/18 1224  Resp 32 11/25/18 1224  SpO2 94 % 11/25/18 1224  Vitals shown include unvalidated device data.  Last Pain:  Vitals:   11/25/18 0836  TempSrc:   PainSc: 8          Complications: No apparent anesthesia complications

## 2018-11-25 NOTE — Interval H&P Note (Signed)
PCCM Interval Note  73 year old woman follows up today for further evaluation of right middle lobe mass, hypermetabolic mediastinal and hilar lymphadenopathy, hypermetabolic pleural studding with a small effusion.  All consistent with primary lung cancer and ipsilateral spread.  She is here today for bronchoscopy, endobronchial ultrasound and nodal biopsies to obtain a tissue diagnosis and to plan directed therapy.  Vitals:   11/25/18 0829  BP: (!) 147/80  Pulse: 85  Resp: 18  Temp: 98.5 F (36.9 C)  TempSrc: Oral  SpO2: 93%  Weight: 58.5 kg  Height: 5' 1"  (1.549 m)  73 yo woman laying comfortably, no distress Lungs distant but clear.  Heart regular. Oropharynx clear, no evidence of oropharyngeal obstruction.  She does have an irregular raised area in the upper left gumline, question granulation tissue.  Abdomen is diffusely mildly tender to palpation.  No significant edema.  Plan for bronchoscopy with endobronchial ultrasound and nodal biopsies under general anesthesia.  Depending on nodal cytology we may decide to perform right middle lobe transbronchial biopsies to approach the primary mass.  No contraindications.  Procedure discussed in full including risks and benefits, all questions answered.  Baltazar Apo, MD, PhD 11/25/2018, 8:42 AM [facility] [phone] or if no answer [phone]

## 2018-11-25 NOTE — Op Note (Signed)
Video Bronchoscopy with Endobronchial Ultrasound Procedure Note  Date of Operation: 11/25/2018  Pre-op Diagnosis: Right middle lobe mass with mediastinal lymphadenopathy  Post-op Diagnosis: Same  Surgeon: Baltazar Apo  Assistants: None  Anesthesia: General endotracheal anesthesia  Operation: Flexible video fiberoptic bronchoscopy with endobronchial ultrasound and biopsies.  Estimated Blood Loss: Minimal  Complications: None  Indications and History: Debra Rodgers is a 73 y.o. female with history of tobacco use.  She is under evaluation for a right middle lobe mass, mediastinal lymphadenopathy and pleural studding, hypermetabolic on PET scan.  Recommendation was made to achieve a tissue diagnosis via bronchoscopy with endobronchial ultrasound.  The risks, benefits, complications, treatment options and expected outcomes were discussed with the patient.  The possibilities of pneumothorax, pneumonia, reaction to medication, pulmonary aspiration, perforation of a viscus, bleeding, failure to diagnose a condition and creating a complication requiring transfusion or operation were discussed with the patient who freely signed the consent.    Description of Procedure: The patient was examined in the preoperative area and history and data from the preprocedure consultation were reviewed. It was deemed appropriate to proceed.  The patient was taken to OR14, identified as [name] and the procedure verified as Flexible Video Fiberoptic Bronchoscopy.  A Time Out was held and the above information confirmed. After being taken to the operating room general anesthesia was initiated and the patient  was orally intubated. The video fiberoptic bronchoscope was introduced via the endotracheal tube and a general inspection was performed which showed normal airways throughout.  There were no endobronchial lesions or abnormal secretions seen, particularly in the right middle lobe airways which were normal.   Bronchoalveolar lavage was performed in the anterior segment of the right middle lobe with 60 cc normal saline instilled and approximately 15 cc returned.  This will be sent for cytology. The standard scope was then withdrawn and the endobronchial ultrasound was used to identify and characterize the peritracheal, hilar and bronchial lymph nodes. Inspection showed bulky lymphadenopathy throughout the mediastinum.  Nodes were particularly enlarged in the precarinal region, station 7, 4R. Using real-time ultrasound guidance Wang needle biopsies were take from the left precarinal region, labeled as station 4L.  Also station 4R and 7 nodes were sampled and were sent for cytology.  Preliminary cytology indicated that the samples should be adequate for tissue diagnosis.  The patient tolerated the procedure well without apparent complications. There was no significant blood loss. The bronchoscope was withdrawn. Anesthesia was reversed and the patient was taken to the PACU for recovery.   Samples: 1. Wang needle biopsies from the left precarinal region, labeled as 4L node 2. Wang needle biopsies from 4R node 3. Wang needle biopsies from 7 node 4.  BAL from the anterior segment of the right middle lobe  Plans:  The patient will be discharged from the PACU to home when recovered from anesthesia. We will review the cytology and pathology results with the patient when they become available. Outpatient followup will be with Dr. Lamonte Sakai and Dr. Julien Nordmann.   Baltazar Apo, MD, PhD 11/25/2018, 12:13 PM [facility] [phone] or if no answer [phone]

## 2018-11-25 NOTE — Anesthesia Procedure Notes (Signed)
Procedure Name: Intubation Date/Time: 11/25/2018 11:07 AM Performed by: Julieta Bellini, CRNA Pre-anesthesia Checklist: Patient identified, Emergency Drugs available, Suction available and Patient being monitored Patient Re-evaluated:Patient Re-evaluated prior to induction Oxygen Delivery Method: Circle system utilized Preoxygenation: Pre-oxygenation with 100% oxygen Induction Type: IV induction Laryngoscope Size: Mac and 4 Grade View: Grade II Tube type: Oral Tube size: 8.0 mm Number of attempts: 1 Airway Equipment and Method: Stylet Placement Confirmation: ETT inserted through vocal cords under direct vision,  positive ETCO2 and breath sounds checked- equal and bilateral Secured at: 22 cm Tube secured with: Tape Dental Injury: Teeth and Oropharynx as per pre-operative assessment

## 2018-11-25 NOTE — Discharge Instructions (Signed)
Flexible Bronchoscopy, Care After This sheet gives you information about how to care for yourself after your test. Your doctor may also give you more specific instructions. If you have problems or questions, contact your doctor. Follow these instructions at home: Eating and drinking  Do not eat or drink anything (not even water) for 2 hours after your test, or until your numbing medicine (local anesthetic) wears off.  When your numbness is gone and your cough and gag reflexes have come back, you may: ? Eat only soft foods. ? Slowly drink liquids.  The day after the test, go back to your normal diet. Driving  Do not drive for 24 hours if you were given a medicine to help you relax (sedative).  Do not drive or use heavy machinery while taking prescription pain medicine. General instructions   Take over-the-counter and prescription medicines only as told by your doctor.  Return to your normal activities as told. Ask what activities are safe for you.  Do not use any products that have nicotine or tobacco in them. This includes cigarettes and e-cigarettes. If you need help quitting, ask your doctor.  Keep all follow-up visits as told by your doctor. This is important. It is very important if you had a tissue sample (biopsy) taken. Get help right away if:  You have shortness of breath that gets worse.  You get light-headed.  You feel like you are going to pass out (faint).  You have chest pain.  You cough up: ? More than a little blood. ? More blood than before. Summary  Do not eat or drink anything (not even water) for 2 hours after your test, or until your numbing medicine wears off.  Do not use cigarettes. Do not use e-cigarettes.  Get help right away if you have chest pain.   Please call our office for any questions or concerns.  [phone].   This information is not intended to replace advice given to you by your health care provider. Make sure you discuss any  questions you have with your health care provider. Document Released: 01/06/2009 Document Revised: 02/21/2017 Document Reviewed: 03/29/2016 Elsevier Patient Education  2020 [employer].

## 2018-11-26 ENCOUNTER — Encounter (HOSPITAL_COMMUNITY): Payer: Self-pay | Admitting: Emergency Medicine

## 2018-11-26 NOTE — Anesthesia Postprocedure Evaluation (Signed)
Anesthesia Post Note  Patient: Debra Rodgers  Procedure(s) Performed: VIDEO BRONCHOSCOPY WITH ENDOBRONCHIAL ULTRASOUND (Right Bronchus)     Patient location during evaluation: PACU Anesthesia Type: General Level of consciousness: awake and alert Pain management: pain level controlled Vital Signs Assessment: post-procedure vital signs reviewed and stable Respiratory status: spontaneous breathing, nonlabored ventilation and respiratory function stable Cardiovascular status: blood pressure returned to baseline and stable Postop Assessment: no apparent nausea or vomiting Anesthetic complications: no    Last Vitals:  Vitals:   11/25/18 1305 11/25/18 1320  BP: 121/76 139/76  Pulse: 76 74  Resp: (!) 27 (!) 21  Temp:    SpO2: 97% 97%    Last Pain:  Vitals:   11/25/18 1320  TempSrc:   PainSc: 0-No pain                 Ripken Rekowski,W. EDMOND

## 2018-11-28 ENCOUNTER — Telehealth: Payer: Self-pay | Admitting: Emergency Medicine

## 2018-11-28 NOTE — Telephone Encounter (Signed)
Spoke to the patient regarding her FOB results. Cytology shows squamous cell lung cancer. She has OV with Oncology next week. Explained to her that they will discuss therapeutic options at that time. I will look into obtaining PDL-1 and Foundation One testing next week.

## 2018-11-30 ENCOUNTER — Other Ambulatory Visit: Payer: Self-pay | Admitting: Internal Medicine

## 2018-11-30 MED ORDER — HYDROCODONE-ACETAMINOPHEN 7.5-325 MG PO TABS: 1.0000 | ORAL_TABLET | Freq: Four times a day (QID) | ORAL | 0 refills | Status: DC | PRN

## 2018-12-02 ENCOUNTER — Encounter: Payer: Self-pay | Admitting: Physician Assistant

## 2018-12-02 ENCOUNTER — Other Ambulatory Visit: Payer: Self-pay | Admitting: Internal Medicine

## 2018-12-02 ENCOUNTER — Inpatient Hospital Stay: Payer: 59 | Attending: Physician Assistant | Admitting: Physician Assistant

## 2018-12-02 ENCOUNTER — Telehealth: Payer: Self-pay | Admitting: Internal Medicine

## 2018-12-02 ENCOUNTER — Other Ambulatory Visit: Payer: Self-pay

## 2018-12-02 VITALS — BP 117/73 | HR 67 | Temp 98.3°F | Resp 18 | Ht 61.0 in | Wt 124.7 lb

## 2018-12-02 DIAGNOSIS — Z79899 Other long term (current) drug therapy: Secondary | ICD-10-CM | POA: Diagnosis not present

## 2018-12-02 DIAGNOSIS — R634 Abnormal weight loss: Secondary | ICD-10-CM | POA: Insufficient documentation

## 2018-12-02 DIAGNOSIS — Z5112 Encounter for antineoplastic immunotherapy: Secondary | ICD-10-CM

## 2018-12-02 DIAGNOSIS — G4730 Sleep apnea, unspecified: Secondary | ICD-10-CM | POA: Insufficient documentation

## 2018-12-02 DIAGNOSIS — Z5111 Encounter for antineoplastic chemotherapy: Secondary | ICD-10-CM

## 2018-12-02 DIAGNOSIS — I6523 Occlusion and stenosis of bilateral carotid arteries: Secondary | ICD-10-CM | POA: Diagnosis not present

## 2018-12-02 DIAGNOSIS — I700 Atherosclerosis of aorta: Secondary | ICD-10-CM | POA: Diagnosis not present

## 2018-12-02 DIAGNOSIS — F17210 Nicotine dependence, cigarettes, uncomplicated: Secondary | ICD-10-CM | POA: Diagnosis not present

## 2018-12-02 DIAGNOSIS — C3491 Malignant neoplasm of unspecified part of right bronchus or lung: Secondary | ICD-10-CM | POA: Diagnosis not present

## 2018-12-02 DIAGNOSIS — F329 Major depressive disorder, single episode, unspecified: Secondary | ICD-10-CM | POA: Diagnosis not present

## 2018-12-02 DIAGNOSIS — K5900 Constipation, unspecified: Secondary | ICD-10-CM | POA: Diagnosis not present

## 2018-12-02 DIAGNOSIS — C3411 Malignant neoplasm of upper lobe, right bronchus or lung: Secondary | ICD-10-CM | POA: Insufficient documentation

## 2018-12-02 DIAGNOSIS — Z7982 Long term (current) use of aspirin: Secondary | ICD-10-CM | POA: Insufficient documentation

## 2018-12-02 DIAGNOSIS — Z23 Encounter for immunization: Secondary | ICD-10-CM | POA: Insufficient documentation

## 2018-12-02 DIAGNOSIS — Z7189 Other specified counseling: Secondary | ICD-10-CM | POA: Diagnosis not present

## 2018-12-02 DIAGNOSIS — I10 Essential (primary) hypertension: Secondary | ICD-10-CM | POA: Diagnosis not present

## 2018-12-02 DIAGNOSIS — I2510 Atherosclerotic heart disease of native coronary artery without angina pectoris: Secondary | ICD-10-CM | POA: Insufficient documentation

## 2018-12-02 DIAGNOSIS — C782 Secondary malignant neoplasm of pleura: Secondary | ICD-10-CM | POA: Diagnosis not present

## 2018-12-02 DIAGNOSIS — J449 Chronic obstructive pulmonary disease, unspecified: Secondary | ICD-10-CM | POA: Diagnosis not present

## 2018-12-02 HISTORY — DX: Other specified counseling: Z71.89

## 2018-12-02 HISTORY — DX: Encounter for antineoplastic chemotherapy: Z51.11

## 2018-12-02 HISTORY — DX: Encounter for antineoplastic immunotherapy: Z51.12

## 2018-12-02 MED ORDER — PROCHLORPERAZINE MALEATE 10 MG PO TABS: 10.0000 mg | ORAL_TABLET | Freq: Four times a day (QID) | ORAL | 2 refills | Status: DC | PRN

## 2018-12-02 NOTE — Patient Instructions (Addendum)
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  -The treatment consists of 3 chemotherapy drugs called Carboplatin, Paclitaxel and Keytruda (immunotherapy). This will be given once every 3 weeks. You will receive a booster shot following chemotherapy. The purpose of this is to make sure that your blood cells don't drop too low after getting chemotherapy. Chemotherapy can put you at risk for developing infections so the shot helps protect you from infections -We will be checking your lab work once a week for the first few treatments to make sure that your blood work is at a normal level -I will have you meet with one of our nurses who will discuss with you in more detail about your current treatment. She is fantastic and provides a bunch of great information about your chemotherapy. This will likely be sometime this week or next week -We are planning on having your first treatment starting next week. We will see you for a 1 week follow up after your treatment -If you need [place], call us at [phone]. Just as for Cassie or Dr. Worthy Flank nurse.  -I will send a prescription for nausea medication to your pharmacy. This is called compazine.

## 2018-12-02 NOTE — Progress Notes (Signed)
[facility] OFFICE PROGRESS NOTE  [name], MD [name] [address]  DIAGNOSIS: Highly suspicious metastatic lung cancer presented with right upper lobe lung mass in addition to pleural-based metastasis andmediastinal lymphadenopathy.   PRIOR THERAPY: None  CURRENT THERAPY: Chemotherapy with carboplatin for an AUC of 5, paclitaxel 175 mg/m, and Keytruda 200 mg IV every 3 weeks with Neulasta support.  First dose is expected on 12/09/2018.  INTERVAL HISTORY: Debra Rodgers 73 y.o. female returns to the clinic today for a follow up visit.  The patient is feeling fairly well today but continues to endorse pain on the right side of the chest with some radiation to her back.  The patient is currently prescribed Norco for her pain.  She denies any recent fever, chills, or night sweats.  She endorses some decreased appetite and weight loss.  Per chart review it appears that she lost about 5 pounds since her last appointment. She reports her baseline shortness of breath.  Reports some nausea and two episodes of vomiting.  Denies any diarrhea or constipation.  She denies any headache or visual changes.   The patient was recently found to have a right upper lobe lung mass with pleural based metastases concerning for metastatic lung cancer.  She recently underwent a bronchoscopy with endobronchial ultrasound and biopsy under the care of Dr. Lamonte Sakai for tissue diagnosis.  The patient is here today for evaluation and to discuss her biopsy results and treatment options.    MEDICAL HISTORY: Past Medical History:  Diagnosis Date  . COPD (chronic obstructive pulmonary disease) ([place])   . Depression   . Essential hypertension   . Headache   . History of migraine headaches   . Sleep apnea   . Tobacco use disorder     ALLERGIES:  is allergic to codeine sulfate and pantoprazole sodium.  MEDICATIONS:  Current Outpatient Medications  Medication Sig Dispense  Refill  . acetaminophen (TYLENOL) 325 MG tablet Take 2 tablets (650 mg total) by mouth every 6 (six) hours as needed. 30 tablet 0  . amLODipine (NORVASC) 10 MG tablet TAKE 1 TABLET BY MOUTH EVERY DAY 30 tablet 5  . aspirin 81 MG EC tablet Take 81 mg by mouth daily.      Marland Kitchen atorvastatin (LIPITOR) 20 MG tablet TAKE 1 TABLET BY MOUTH EVERY DAY 90 tablet 3  . chlorthalidone (HYGROTON) 50 MG tablet TAKE 1 TABLET BY MOUTH EVERY DAY 90 tablet 1  . famotidine (PEPCID) 20 MG tablet Take 20 mg by mouth daily.     Marland Kitchen FLUoxetine (PROZAC) 20 MG capsule TAKE 1 CAPSULE (20 MG TOTAL) DAILY BY MOUTH. 30 capsule 5  . gabapentin (NEURONTIN) 100 MG capsule Take 1 capsule (100 mg total) by mouth 2 (two) times daily. 180 capsule 3  . HYDROcodone-acetaminophen (NORCO) 7.5-325 MG tablet Take 1 tablet by mouth every 6 (six) hours as needed for moderate pain. 30 tablet 0  . lisinopril (ZESTRIL) 10 MG tablet TAKE 1 TABLET BY MOUTH EVERY DAY 90 tablet 1  . VENTOLIN HFA 108 (90 Base) MCG/ACT inhaler INHALE 1-2 PUFFS INTO THE LUNGS EVERY 6 (SIX) HOURS AS NEEDED FOR WHEEZING OR SHORTNESS OF BREATH. 18 Inhaler 0  . nicotine ([employer] NICOTINE TRANSDERMAL SYS) 14 mg/24hr patch Place 1 patch (14 mg total) onto the skin daily. (Patient not taking: Reported on 12/02/2018) 42 patch 0  . prochlorperazine (COMPAZINE) 10 MG tablet Take 1 tablet (10 mg total) by mouth every  6 (six) hours as needed for nausea or vomiting. 30 tablet 2   No current facility-administered medications for this visit.     SURGICAL HISTORY:  Past Surgical History:  Procedure Laterality Date  . ABDOMINAL HYSTERECTOMY    . CHOLECYSTECTOMY    . ROTATOR CUFF REPAIR  3/04  . VIDEO BRONCHOSCOPY WITH ENDOBRONCHIAL ULTRASOUND Right 11/25/2018   Procedure: VIDEO BRONCHOSCOPY WITH ENDOBRONCHIAL ULTRASOUND;  Surgeon: Collene Gobble, MD;  Location: [facility] OR;  Service: Thoracic;  Laterality: Right;    REVIEW OF SYSTEMS:   Review of Systems  Constitutional: Positive for  appetite change and weight loss. Negative for chills, fatigue, and fever.  HENT: Negative for mouth sores, nosebleeds, sore throat and trouble swallowing.   Eyes: Negative for eye problems and icterus.  Respiratory: Positive for baseline shortness of breath. Negative for cough, hemoptysis, and wheezing.   Cardiovascular: Positive for anterior chest discomfort. Negative for leg swelling.  Gastrointestinal: Negative for abdominal pain, constipation, diarrhea, nausea and vomiting.  Genitourinary: Negative for bladder incontinence, difficulty urinating, dysuria, frequency and hematuria.   Musculoskeletal: Positive for back pain. Negative for gait problem, neck pain and neck stiffness.  Skin: Negative for itching and rash.  Neurological: Negative for dizziness, extremity weakness, gait problem, headaches, light-headedness and seizures.  Hematological: Negative for adenopathy. Does not bruise/bleed easily.  Psychiatric/Behavioral: Negative for confusion, depression and sleep disturbance. The patient is not nervous/anxious.     PHYSICAL EXAMINATION:  Blood pressure 117/73, pulse 67, temperature 98.3 F (36.8 C), resp. rate 18, height 5' 1"  (1.549 m), weight 124 lb 11.2 oz (56.6 kg), SpO2 100 %.  ECOG PERFORMANCE STATUS: 1 - Symptomatic but completely ambulatory  Physical Exam  Constitutional: Oriented to person, place, and time and well-developed, well-nourished, and in no distress. No distress.  HENT:  Head: Normocephalic and atraumatic.  Mouth/Throat: Oropharynx is clear and moist. No oropharyngeal exudate.  Eyes: Conjunctivae are normal. Right eye exhibits no discharge. Left eye exhibits no discharge. No scleral icterus.  Neck: Normal range of motion. Neck supple.  Cardiovascular: Normal rate, regular rhythm, normal heart sounds and intact distal pulses.   Pulmonary/Chest: Effort normal and breath sounds normal. No respiratory distress. No wheezes. No rales.  Abdominal: Soft. Bowel sounds  are normal. Exhibits no distension and no mass. There is no tenderness.  Musculoskeletal: Normal range of motion. Exhibits no edema.  Lymphadenopathy:    No cervical adenopathy.  Neurological: Alert and oriented to person, place, and time. Exhibits normal muscle tone. Gait normal. Coordination normal.  Skin: Skin is warm and dry. No rash noted. Not diaphoretic. No erythema. No pallor.  Psychiatric: Mood, memory and judgment normal.  Vitals reviewed.  LABORATORY DATA: Lab Results  Component Value Date   WBC 5.3 11/23/2018   HGB 12.1 11/23/2018   HCT 36.2 11/23/2018   MCV 94.5 11/23/2018   PLT 344 11/23/2018      Chemistry      Component Value Date/Time   NA 135 11/23/2018 1133   NA 139 07/21/2017 0958   K 3.6 11/23/2018 1133   CL 99 11/23/2018 1133   CO2 27 11/23/2018 1133   BUN 30 (H) 11/23/2018 1133   BUN 26 07/21/2017 0958   CREATININE 0.94 11/23/2018 1133   CREATININE 0.79 09/03/2013 1540      Component Value Date/Time   CALCIUM 11.8 (H) 11/23/2018 1133   ALKPHOS 79 11/23/2018 1133   AST 12 (L) 11/23/2018 1133   ALT 6 11/23/2018 1133   BILITOT 0.8 11/23/2018  1133       RADIOGRAPHIC STUDIES:  Dg Ribs Unilateral W/chest Left  Result Date: 11/05/2018 CLINICAL DATA:  73 year old female status post fall with chest pain greater on the left. EXAM: LEFT RIBS AND CHEST - 3+ VIEW COMPARISON:  Chest radiographs 02/26/2013. left rib series 03/21/2017. FINDINGS: PA view of the chest demonstrates confluent abnormal right lung base opacity. There is stable chronic elevation of the left hemidiaphragm. Tortuous thoracic aorta. Other mediastinal contours are within normal limits. Visualized tracheal air column is within normal limits. No pneumothorax or pulmonary edema. Questionable small right pleural effusion. No left rib fracture identified. Other visible osseous structures appear intact. Prior lower cervical ACDF. Negative visible bowel gas pattern. IMPRESSION: 1. Confluent  abnormal opacity at the right lung base, nonspecific, and with possible associated small right pleural effusion. If there are no signs/symptoms of infection then recommend Chest CT (IV contrast preferred) to further characterize. If pneumonia is suspected then Followup PA and lateral chest X-ray is recommended in 3-4 weeks following trial of antibiotic therapy. 2. No left rib fracture or other acute cardiopulmonary abnormality. Electronically Signed   By: Genevie Ann M.D.   On: 11/05/2018 00:18   Ct Chest W Contrast  Result Date: 11/07/2018 CLINICAL DATA:  On yesterday's abdomen CT, report described RIGHT middle lobe pleural based mass and multiple additional smaller pulmonary nodules consistent with metastatic disease. CT report also described diffuse thickening of the RIGHT pleural with pleural based nodule/masses consistent with metastatic implants. Small RIGHT pleural effusion was also noted. Dedicated chest CT was recommended. EXAM: CT CHEST WITH CONTRAST TECHNIQUE: Multidetector CT imaging of the chest was performed during intravenous contrast administration. CONTRAST:  60m OMNIPAQUE IOHEXOL 300 MG/ML  SOLN COMPARISON:  CT abdomen from yesterday. FINDINGS: Cardiovascular: Scattered aortic atherosclerosis. No thoracic aortic aneurysm. Heart size is within normal limits. No pericardial effusion. Mediastinum/Nodes: Esophagus is not well seen in its entirety but visualized portions are unremarkable. Conglomerate lymphadenopathy within the RIGHT lower paratracheal space and subcarinal space, with short axis measurements of 2.3 cm and 2.4 cm respectively. Trachea is unremarkable. Lungs/Pleura: RIGHT middle lobe mass measures 4.1 cm, pleural based as described on earlier abdomen CT report. Smaller scattered pleural based nodules along the RIGHT major fissure and minor fissure, almost certainly additional metastatic implants. Also again noted is irregular pleural thickening, and presumed pleural based mass, at the  RIGHT lung base posteriorly, with associated small pleural effusion. LEFT lung is clear. Emphysematous changes at the lung apices, mild to moderate in degree, with associated emphysematous blebs. Upper Abdomen: No acute findings on limited images of the upper abdomen. Musculoskeletal: Advanced degenerative change within the lower cervical spine, incompletely imaged. Additional degenerative spondylosis within the upper and lower portions of the slightly scoliotic thoracic spine. No acute appearing osseous abnormality. IMPRESSION: 1. RIGHT middle lobe pleural based mass measures 4.1 cm greatest dimension, as described on yesterday's abdomen CT report, consistent with primary carcinoma versus metastatic disease. 2. Smaller scattered pleural based nodules along the RIGHT major fissure and minor fissure, almost certainly additional metastatic implants. 3. Irregular pleural thickening along the posterior and posterior-medial aspects of the RIGHT lung, lower lobe predominant, with associated loculated effusion, almost certainly representing additional metastatic disease. 4. Conglomerate lymphadenopathy within the RIGHT lower paratracheal space and subcarinal space, with short axis measurements of 2.3 cm and 2.4 cm respectively, compatible with nodal metastases. 5. Emphysematous changes at the lung apices, mild to moderate in degree, with associated emphysematous blebs. 6. LEFT lung is clear.  7. Aortic atherosclerosis. Aortic Atherosclerosis (ICD10-I70.0) and Emphysema (ICD10-J43.9). Electronically Signed   By: Franki Cabot M.D.   On: 11/07/2018 13:47   Mr Jeri Cos AC Contrast  Result Date: 11/19/2018 CLINICAL DATA:  Lung cancer staging EXAM: MRI HEAD WITHOUT AND WITH CONTRAST TECHNIQUE: Multiplanar, multiecho pulse sequences of the brain and surrounding structures were obtained without and with intravenous contrast. CONTRAST:  6 mL Gadavist COMPARISON:  Brain MRI 10/06/2013 FINDINGS: BRAIN: There is no acute infarct,  acute hemorrhage or extra-axial collection. Multifocal white matter hyperintensity, most commonly due to chronic ischemic microangiopathy. The cerebral and cerebellar volume are age-appropriate. There is no hydrocephalus. The midline structures are normal. There is no abnormal contrast enhancement. VASCULAR: The major intracranial arterial and venous sinus flow voids are normal. Susceptibility-sensitive sequences show no chronic microhemorrhage or superficial siderosis. SKULL AND UPPER CERVICAL SPINE: Calvarial bone marrow signal is normal. There is no skull base mass. The visualized upper cervical spine and soft tissues are normal. SINUSES/ORBITS: There are no fluid levels or advanced mucosal thickening. The mastoid air cells and middle ear cavities are free of fluid. The orbits are normal. IMPRESSION: 1. No intracranial or calvarial metastatic disease. 2. Chronic small vessel ischemia. Electronically Signed   By: Ulyses Jarred M.D.   On: 11/19/2018 20:19   Ct Abdomen Pelvis W Contrast  Result Date: 11/05/2018 CLINICAL DATA:  73 year old female with abdominal distention and epigastric and flank pain. Patient fell 2 weeks ago. EXAM: CT ABDOMEN AND PELVIS WITH CONTRAST TECHNIQUE: Multidetector CT imaging of the abdomen and pelvis was performed using the standard protocol following bolus administration of intravenous contrast. CONTRAST:  151m OMNIPAQUE IOHEXOL 300 MG/ML  SOLN COMPARISON:  CT of the abdomen pelvis dated 02/18/2017 and left rib radiograph dated 11/05/2018 FINDINGS: Lower chest: Partially visualized 3.3 x 2.5 cm pleural based mass in the right middle lobe. Several additional smaller nodules noted in the right lung base. There is thickening and nodularity of the right lower pleural surface with multiple pleural implants/masses consistent with metastatic disease. There is a small right pleural effusion. Dedicated chest CT with contrast is recommended for evaluation of the lungs. There is no  intra-abdominal free air or free fluid. Hepatobiliary: Several subcentimeter hepatic hypodense foci measure up to 9 mm. These lesions are too small to characterize but appears similar to prior CT and not appear to demonstrate enhancement, likely benign etiology such as cyst or hemangiomata. There is mild intrahepatic biliary ductal dilatation. The gallbladder is not visualized, likely surgically absent. No retained calcified stone noted in the central CBD. Pancreas: Unremarkable. No pancreatic ductal dilatation or surrounding inflammatory changes. Spleen: Normal in size without focal abnormality. Adrenals/Urinary Tract: The adrenal glands are unremarkable. There is no hydronephrosis on either side. There is symmetric enhancement and excretion of contrast by both kidneys. The urinary bladder is partially distended and grossly unremarkable. Stomach/Bowel: There is sigmoid diverticulosis without active inflammatory changes. There is no bowel obstruction or active inflammation. The appendix is not visualized with certainty. No inflammatory changes identified in the right lower quadrant. Vascular/Lymphatic: There is moderate aortoiliac atherosclerotic disease. 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 No aortic dissection. No periaortic inflammation or contrast extravasation. The origins of the celiac axis, SMA, IMA and the renal arteries appear patent. The SMV, splenic vein, and main portal vein are patent. No portal venous gas. There is no adenopathy. Reproductive: Hysterectomy. There is a 6.3 cm left adnexal cyst. Further evaluation with pelvic ultrasound on a nonemergent  basis recommended. Other: Midline vertical anterior pelvic wall incisional scar. No fluid collection. Musculoskeletal: Multilevel degenerative changes of the spine with disc desiccation and vacuum phenomena. No acute osseous pathology. No suspicious osseous lesions. IMPRESSION: 1. No acute intra-abdominal or pelvic pathology.  2. Partially visualized right middle lobe pleural-based mass with multiple additional smaller pulmonary nodules consistent with metastatic disease. Diffuse thickening of the right pleural with pleural-based nodules/masses consistent with metastatic implant. A small right pleural effusion is also noted. Dedicated chest CT with contrast is recommended for evaluation of the lungs. 3. 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 Recommend followup by ultrasound in 2 years. This recommendation follows ACR consensus guidelines: White Paper of the ACR Incidental Findings Committee II on Vascular Findings. J Am Coll Radiol 2013; 10:789-794. Aortic aneurysm NOS (ICD10-I71.9) 4. Sigmoid diverticulosis. No bowel obstruction or active inflammation. 5. A 6.3 cm left adnexal cyst. Further evaluation with pelvic ultrasound on a nonemergent basis recommended. 6. Aortic Atherosclerosis (ICD10-I70.0). Electronically Signed   By: Anner Crete M.D.   On: 11/05/2018 01:16   Nm Pet Image Initial (pi) Skull Base To Thigh  Result Date: 11/18/2018 CLINICAL DATA:  Initial treatment strategy for right-sided pulmonary and pleural lesions suspicious for primary bronchogenic carcinoma and metastasis. Thoracic adenopathy. EXAM: NUCLEAR MEDICINE PET SKULL BASE TO THIGH TECHNIQUE: 6.1 mCi F-18 FDG was injected intravenously. Full-ring PET imaging was performed from the skull base to thigh after the radiotracer. CT data was obtained and used for attenuation correction and anatomic localization. Fasting blood glucose: 85 mg/dl COMPARISON:  Chest CT 11/06/2018.  Abdominopelvic CT 11/05/2018. FINDINGS: Mediastinal blood pool activity: SUV max 2.2 Liver activity: SUV max NA NECK: No areas of abnormal hypermetabolism. Incidental CT findings: Apparent right nasopharyngeal soft tissue fullness is favored to be due to obliquity, without hypermetabolic correlate. No cervical adenopathy. Bilateral carotid atherosclerosis. CHEST: Irregular  right-sided pleural thickening and hypermetabolism. Example area of diffuse thickening posteriorly and medially at 1.3 cm corresponding to hypermetabolism at a S.U.V. max of 13.1. Example image 53/4. A probable pulmonary parenchymal lesion in the right middle lobe measures 3.7 x 4.0 cm and a S.U.V. max of 15.9 on 76/4. Extensive right-sided mediastinal adenopathy. Example right paratracheal node of 2.4 cm and a S.U.V. max of 18.8 on 54/4. Subcarinal adenopathy at 1.9 cm and a S.U.V. max of 17.7. Incidental CT findings: Deferred to recent diagnostic CT. Centrilobular and paraseptal emphysema. Aortic and coronary artery atherosclerosis. ABDOMEN/PELVIS: No abdominopelvic parenchymal or nodal hypermetabolism. Incidental CT findings: Deferred to abdominopelvic CT. Normal adrenal glands. Left pelvic, likely ovarian cystic lesion at 5.7 cm, as on prior CT. SKELETON: No abnormal marrow activity. Incidental CT findings: Cervical spine fixation. IMPRESSION: 1. Extensive right-sided pleural and thoracic nodal hypermetabolic disease, as detailed above. Concurrent likely parenchymal right middle lobe hypermetabolic lung mass. Favor primary bronchogenic carcinoma with pleural and nodal metastasis. Mesothelioma could look similar, but is felt less likely, given absence of asbestos related pleural disease. 2. No evidence of extrathoracic hypermetabolic metastasis. 3. Aortic atherosclerosis (ICD10-I70.0), coronary artery atherosclerosis and emphysema (ICD10-J43.9). 4. Left pelvic/ovarian cystic lesion, as on diagnostic CT. Electronically Signed   By: Abigail Miyamoto M.D.   On: 11/18/2018 16:13     ASSESSMENT/PLAN:  This is a very pleasant 73 year old African-American female diagnosed with stage IV non-small cell lung cancer, squamous cell carcinoma.  She presented with a right upper lobe lung mass and pleural based metastases and mediastinal lymphadenopathy. She was diagnosed in August 2020   The patient recently had  a  bronchoscopy and endobronchial ultrasound and biopsy under the care of Dr. Lamonte Sakai.  The pathology was consistent with squamous cell carcinoma.  Dr. Julien Nordmann had a lengthy discussion with the patient today about her current condition and treatment options.  She was given the option of hospice/palliative care vs. Systemic chemotherapy with carboplatin for an AUC of 5, paclitaxel 175 mg/m, and Keytruda 200 mg IV every 3 weeks with Neulasta support.    The patient is interested in starting systemic chemotherapy.  She is expected to start her first cycle of treatment next week on 12/09/2018  We discussed the adverse side effects of treatment including but not limited to alopecia, myelosuppression, nausea and vomiting, peripheral neuropathy, liver or renal dysfunction as well as the immunotherapy adverse effects.   We will see the patient back for a  follow-up visit in 2 weeks for a one week follow up after completing her first cycle of chemotherapy to manage any adverse side effects of treatment.  I will arrange for the patient to have a chemoeducation course prior to receiving her first cycle of chemotherapy.  I sent a prescription for compazine 10 mg p.o. every 6 hours as needed for nausea to the patient's pharmacy.  The patient expressed interest in evaluation by the cancer center nutritionist to discuss her weight loss and lack of appetite. I have placed a referral for their evaluation and recommendations.   The patient was advised to call immediately if she has any concerning symptoms in the interval. The patient voices understanding of current disease status and treatment options and is in agreement with the current care plan. All questions were answered. The patient knows to call the clinic with any problems, questions or concerns. We can certainly see the patient much sooner if necessary   Orders Placed This Encounter  Procedures  . CMP ([facility] only)    Standing Status:   Standing     Number of Occurrences:   12    Standing Expiration Date:   12/02/2019  . CBC with Differential (Cancer Center Only)    Standing Status:   Standing    Number of Occurrences:   12    Standing Expiration Date:   12/02/2019  . TSH    Standing Status:   Standing    Number of Occurrences:   6    Standing Expiration Date:   12/02/2019  . Ambulatory referral to Nutrition and Diabetic E    Referral Priority:   Routine    Referral Type:   Consultation    Referral Reason:   Specialty Services Required    Number of Visits Requested:   [place], PA-C 12/02/18  ADDENDUM: Hematology/Oncology Attending: I had a face-to-face encounter with the patient today.  I recommended her care plan.  This is a very pleasant 73 years old African-American female recently diagnosed with a stage IV non-small cell lung cancer, squamous cell carcinoma with extensive disease on the right side of the chest as well as pleural-based masses and mediastinal lymphadenopathy.  She underwent bronchoscopy with endobronchial ultrasound and biopsies and the final pathology was consistent with squamous cell carcinoma. I had a lengthy discussion with the patient today about her current condition and treatment options.  The patient has incurable condition and all the treatment options will be of palliative nature. I gave her the option of palliative care and hospice referral versus consideration of systemic chemotherapy with carboplatin for AUC of 5, paclitaxel 175  mg/M2 and Keytruda 200 mg IV every 3 weeks. The patient is interested in proceeding with systemic chemotherapy. I discussed with her the adverse effect of this treatment including but not limited to alopecia, myelosuppression, nausea and vomiting, peripheral neuropathy, liver or renal dysfunction as well as the adverse effect of the immunotherapy. She is expected to start the first cycle of this treatment next week. We will arrange for the patient to have a  chemotherapy education class before the first dose of her treatment. She will come back for follow-up visit in 2 weeks for evaluation and management of any adverse effect of her treatment. The patient was advised to call immediately if she has any concerning symptoms in the interval.  Disclaimer: This note was dictated with voice recognition software. Similar sounding words can inadvertently be transcribed and may be missed upon review. Eilleen Kempf, MD 12/02/18

## 2018-12-02 NOTE — Progress Notes (Signed)
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 

## 2018-12-02 NOTE — Telephone Encounter (Signed)
Scheduled per [id number] los, patient received after visit summary and calender.

## 2018-12-09 ENCOUNTER — Other Ambulatory Visit: Payer: Self-pay | Admitting: Physician Assistant

## 2018-12-09 ENCOUNTER — Inpatient Hospital Stay: Payer: 59

## 2018-12-09 ENCOUNTER — Inpatient Hospital Stay: Payer: 59 | Admitting: Nutrition

## 2018-12-09 ENCOUNTER — Other Ambulatory Visit: Payer: Self-pay

## 2018-12-09 VITALS — BP 102/63 | HR 64 | Temp 98.9°F | Resp 18

## 2018-12-09 DIAGNOSIS — C3491 Malignant neoplasm of unspecified part of right bronchus or lung: Secondary | ICD-10-CM

## 2018-12-09 DIAGNOSIS — C3411 Malignant neoplasm of upper lobe, right bronchus or lung: Secondary | ICD-10-CM | POA: Diagnosis not present

## 2018-12-09 LAB — CBC WITH DIFFERENTIAL (CANCER CENTER ONLY)
Abs Immature Granulocytes: 0.02 10*3/uL (ref 0.00–0.07)
Basophils Absolute: 0.1 10*3/uL (ref 0.0–0.1)
Basophils Relative: 1 %
Eosinophils Absolute: 0.1 10*3/uL (ref 0.0–0.5)
Eosinophils Relative: 1 %
HCT: 37.5 % (ref 36.0–46.0)
Hemoglobin: 13 g/dL (ref 12.0–15.0)
Immature Granulocytes: 0 %
Lymphocytes Relative: 25 %
Lymphs Abs: 2.1 10*3/uL (ref 0.7–4.0)
MCH: 32.1 pg (ref 26.0–34.0)
MCHC: 34.7 g/dL (ref 30.0–36.0)
MCV: 92.6 fL (ref 80.0–100.0)
Monocytes Absolute: 0.7 10*3/uL (ref 0.1–1.0)
Monocytes Relative: 8 %
Neutro Abs: 5.6 10*3/uL (ref 1.7–7.7)
Neutrophils Relative %: 65 %
Platelet Count: 351 10*3/uL (ref 150–400)
RBC: 4.05 MIL/uL (ref 3.87–5.11)
RDW: 12.5 % (ref 11.5–15.5)
WBC Count: 8.4 10*3/uL (ref 4.0–10.5)
nRBC: 0 % (ref 0.0–0.2)

## 2018-12-09 LAB — CMP (CANCER CENTER ONLY)
ALT: <6 U/L (ref 0–44)
AST: 13 U/L — ABNORMAL LOW (ref 15–41)
Albumin: 3.8 g/dL (ref 3.5–5.0)
Alkaline Phosphatase: 82 U/L (ref 38–126)
Anion gap: 7 (ref 5–15)
BUN: 40 mg/dL — ABNORMAL HIGH (ref 8–23)
CO2: 29 mmol/L (ref 22–32)
Calcium: 15.4 mg/dL (ref 8.9–10.3)
Chloride: 96 mmol/L — ABNORMAL LOW (ref 98–111)
Creatinine: 1.22 mg/dL — ABNORMAL HIGH (ref 0.44–1.00)
GFR, Est AFR Am: 52 mL/min — ABNORMAL LOW (ref >60)
GFR, Estimated: 45 mL/min — ABNORMAL LOW (ref >60)
Glucose, Bld: 116 mg/dL — ABNORMAL HIGH (ref 70–99)
Potassium: 3.2 mmol/L — ABNORMAL LOW (ref 3.5–5.1)
Sodium: 132 mmol/L — ABNORMAL LOW (ref 135–145)
Total Bilirubin: 0.6 mg/dL (ref 0.3–1.2)
Total Protein: 7.7 g/dL (ref 6.5–8.1)

## 2018-12-09 LAB — TSH: TSH: 1.977 u[IU]/mL (ref 0.308–3.960)

## 2018-12-09 MED ORDER — SODIUM CHLORIDE 0.9 % IV SOLN
Freq: Once | INTRAVENOUS | Status: AC
  Administered 2018-12-09: 14:00:00 via INTRAVENOUS
  Filled 2018-12-09: qty 5

## 2018-12-09 MED ORDER — POTASSIUM CHLORIDE IN NACL 20-0.9 MEQ/L-% IV SOLN
Freq: Once | INTRAVENOUS | Status: AC
  Administered 2018-12-09: 14:00:00 via INTRAVENOUS
  Filled 2018-12-09: qty 1000

## 2018-12-09 MED ORDER — DENOSUMAB 120 MG/1.7ML ~~LOC~~ SOLN
SUBCUTANEOUS | Status: AC
  Filled 2018-12-09: qty 1.7

## 2018-12-09 MED ORDER — SODIUM CHLORIDE 0.9 % IV SOLN
200.0000 mg | Freq: Once | INTRAVENOUS | Status: AC
  Administered 2018-12-09: 200 mg via INTRAVENOUS
  Filled 2018-12-09: qty 8

## 2018-12-09 MED ORDER — SODIUM CHLORIDE 0.9 % IV SOLN: Freq: Once | INTRAVENOUS | Status: DC

## 2018-12-09 MED ORDER — FAMOTIDINE IN NACL 20-0.9 MG/50ML-% IV SOLN
20.0000 mg | Freq: Once | INTRAVENOUS | Status: AC
  Administered 2018-12-09: 20 mg via INTRAVENOUS

## 2018-12-09 MED ORDER — PALONOSETRON HCL INJECTION 0.25 MG/5ML
INTRAVENOUS | Status: AC
  Filled 2018-12-09: qty 5

## 2018-12-09 MED ORDER — FUROSEMIDE 10 MG/ML IJ SOLN
INTRAMUSCULAR | Status: AC
  Filled 2018-12-09: qty 2

## 2018-12-09 MED ORDER — SODIUM CHLORIDE 0.9 % IV SOLN
Freq: Once | INTRAVENOUS | Status: AC
  Administered 2018-12-09: 13:00:00 via INTRAVENOUS
  Filled 2018-12-09: qty 250

## 2018-12-09 MED ORDER — SODIUM CHLORIDE 0.9 % IV SOLN
Freq: Once | INTRAVENOUS | Status: DC
  Filled 2018-12-09: qty 250

## 2018-12-09 MED ORDER — DIPHENHYDRAMINE HCL 50 MG/ML IJ SOLN
INTRAMUSCULAR | Status: AC
  Filled 2018-12-09: qty 1

## 2018-12-09 MED ORDER — DENOSUMAB 120 MG/1.7ML ~~LOC~~ SOLN
120.0000 mg | Freq: Once | SUBCUTANEOUS | Status: AC
  Administered 2018-12-09: 120 mg via SUBCUTANEOUS

## 2018-12-09 MED ORDER — DIPHENHYDRAMINE HCL 50 MG/ML IJ SOLN
50.0000 mg | Freq: Once | INTRAMUSCULAR | Status: AC
  Administered 2018-12-09: 50 mg via INTRAVENOUS

## 2018-12-09 MED ORDER — SODIUM CHLORIDE 0.9 % IV SOLN: 355.5000 mg | Freq: Once | INTRAVENOUS | Status: DC

## 2018-12-09 MED ORDER — FAMOTIDINE IN NACL 20-0.9 MG/50ML-% IV SOLN
INTRAVENOUS | Status: AC
  Filled 2018-12-09: qty 50

## 2018-12-09 MED ORDER — PALONOSETRON HCL INJECTION 0.25 MG/5ML
0.2500 mg | Freq: Once | INTRAVENOUS | Status: AC
  Administered 2018-12-09: 0.25 mg via INTRAVENOUS

## 2018-12-09 MED ORDER — SODIUM CHLORIDE 0.9 % IV SOLN
175.0000 mg/m2 | Freq: Once | INTRAVENOUS | Status: AC
  Administered 2018-12-09: 276 mg via INTRAVENOUS
  Filled 2018-12-09: qty 46

## 2018-12-09 MED ORDER — FUROSEMIDE 10 MG/ML IJ SOLN
20.0000 mg | Freq: Once | INTRAMUSCULAR | Status: AC
  Administered 2018-12-09: 20 mg via INTRAVENOUS

## 2018-12-09 MED ORDER — FUROSEMIDE 10 MG/ML IJ SOLN: 20.0000 mg | Freq: Once | INTRAMUSCULAR | Status: DC

## 2018-12-09 MED ORDER — DENOSUMAB 120 MG/1.7ML ~~LOC~~ SOLN: 120.0000 mg | Freq: Once | SUBCUTANEOUS | Status: DC

## 2018-12-09 MED ORDER — SODIUM CHLORIDE 0.9 % IV SOLN
314.0000 mg | Freq: Once | INTRAVENOUS | Status: AC
  Administered 2018-12-09: 310 mg via INTRAVENOUS
  Filled 2018-12-09: qty 31

## 2018-12-09 NOTE — Progress Notes (Signed)
Okay to treat today with calcium 15.4 per Cassie Heilingoetter, PA per Dr. Julien Nordmann.

## 2018-12-09 NOTE — Patient Instructions (Signed)
[facility] Discharge Instructions for Patients Receiving Chemotherapy  Today you received the following chemotherapy agents: Keytruda, Taxol and Carboplatin.  To help prevent nausea and vomiting after your treatment, we encourage you to take your nausea medication as directed.   If you develop nausea and vomiting that is not controlled by your nausea medication, call the clinic.   BELOW ARE SYMPTOMS THAT SHOULD BE REPORTED IMMEDIATELY:  *FEVER GREATER THAN 100.5 F  *CHILLS WITH OR WITHOUT FEVER  NAUSEA AND VOMITING THAT IS NOT CONTROLLED WITH YOUR NAUSEA MEDICATION  *UNUSUAL SHORTNESS OF BREATH  *UNUSUAL BRUISING OR BLEEDING  TENDERNESS IN MOUTH AND THROAT WITH OR WITHOUT PRESENCE OF ULCERS  *URINARY PROBLEMS  *BOWEL PROBLEMS  UNUSUAL RASH Items with * indicate a potential emergency and should be followed up as soon as possible.  Feel free to call the clinic should you have any questions or concerns. The clinic phone number is [phone].  Please show the [facility] at check-in to the Emergency Department and triage nurse. Pembrolizumab injection What is this medicine? PEMBROLIZUMAB (pem broe liz ue mab) is a monoclonal antibody. It is used to treat bladder cancer, cervical cancer, endometrial cancer, esophageal cancer, head and neck cancer, hepatocellular cancer, Hodgkin lymphoma, kidney cancer, lymphoma, melanoma, Merkel cell carcinoma, lung cancer, stomach cancer, urothelial cancer, and cancers that have a certain genetic condition. This medicine may be used for other purposes; ask your health care provider or pharmacist if you have questions. COMMON BRAND NAME(S): Keytruda What should I tell my health care provider before I take this medicine? They need to know if you have any of these conditions:  diabetes  immune system problems  inflammatory bowel disease  liver disease  lung or breathing disease  lupus  received or scheduled to  receive an organ transplant or a stem-cell transplant that uses donor stem cells  an unusual or allergic reaction to pembrolizumab, other medicines, foods, dyes, or preservatives  pregnant or trying to get pregnant  breast-feeding How should I use this medicine? This medicine is for infusion into a vein. It is given by a health care professional in a hospital or clinic setting. A special MedGuide will be given to you before each treatment. Be sure to read this information carefully each time. Talk to your pediatrician regarding the use of this medicine in children. While this drug may be prescribed for selected conditions, precautions do apply. Overdosage: If you think you have taken too much of this medicine contact a poison control center or emergency room at once. NOTE: This medicine is only for you. Do not share this medicine with others. What if I miss a dose? It is important not to miss your dose. Call your doctor or health care professional if you are unable to keep an appointment. What may interact with this medicine? Interactions have not been studied. Give your health care provider a list of all the medicines, herbs, non-prescription drugs, or dietary supplements you use. Also tell them if you smoke, drink alcohol, or use illegal drugs. Some items may interact with your medicine. This list may not describe all possible interactions. Give your health care provider a list of all the medicines, herbs, non-prescription drugs, or dietary supplements you use. Also tell them if you smoke, drink alcohol, or use illegal drugs. Some items may interact with your medicine. What should I watch for while using this medicine? Your condition will be monitored carefully while you are receiving this medicine. You  may need blood work done while you are taking this medicine. Do not become pregnant while taking this medicine or for 4 months after stopping it. Women should inform their doctor if they wish  to become pregnant or think they might be pregnant. There is a potential for serious side effects to an unborn child. Talk to your health care professional or pharmacist for more information. Do not breast-feed an infant while taking this medicine or for 4 months after the last dose. What side effects may I notice from receiving this medicine? Side effects that you should report to your doctor or health care professional as soon as possible:  allergic reactions like skin rash, itching or hives, swelling of the face, lips, or tongue  bloody or black, tarry  breathing problems  changes in vision  chest pain  chills  confusion  constipation  cough  diarrhea  dizziness or feeling faint or lightheaded  fast or irregular heartbeat  fever  flushing  hair loss  joint pain  low blood counts - this medicine may decrease the number of white blood cells, red blood cells and platelets. You may be at increased risk for infections and bleeding.  muscle pain  muscle weakness  persistent headache  redness, blistering, peeling or loosening of the skin, including inside the mouth  signs and symptoms of high blood sugar such as dizziness; dry mouth; dry skin; fruity breath; nausea; stomach pain; increased hunger or thirst; increased urination  signs and symptoms of kidney injury like trouble passing urine or change in the amount of urine  signs and symptoms of liver injury like dark urine, light-colored stools, loss of appetite, nausea, right upper belly pain, yellowing of the eyes or skin  sweating  swollen lymph nodes  weight loss Side effects that usually do not require medical attention (report to your doctor or health care professional if they continue or are bothersome):  decreased appetite  muscle pain  tiredness This list may not describe all possible side effects. Call your doctor for medical advice about side effects. You may report side effects to FDA at  1-800-FDA-1088. Where should I keep my medicine? This drug is given in a hospital or clinic and will not be stored at home. NOTE: This sheet is a summary. It may not cover all possible information. If you have questions about this medicine, talk to your doctor, pharmacist, or health care provider.  2020 Elsevier/Gold Standard (2018-04-07 13:46:58) Paclitaxel injection What is this medicine? PACLITAXEL (PAK li TAX el) is a chemotherapy drug. It targets fast dividing cells, like cancer cells, and causes these cells to die. This medicine is used to treat ovarian cancer, breast cancer, lung cancer, Kaposi's sarcoma, and other cancers. This medicine may be used for other purposes; ask your health care provider or pharmacist if you have questions. COMMON BRAND NAME(S): Onxol, Taxol What should I tell my health care provider before I take this medicine? They need to know if you have any of these conditions:  history of irregular heartbeat  liver disease  low blood counts, like low white cell, platelet, or red cell counts  lung or breathing disease, like asthma  tingling of the fingers or toes, or other nerve disorder  an unusual or allergic reaction to paclitaxel, alcohol, polyoxyethylated castor oil, other chemotherapy, other medicines, foods, dyes, or preservatives  pregnant or trying to get pregnant  breast-feeding How should I use this medicine? This drug is given as an infusion into a vein. It  is administered in a hospital or clinic by a specially trained health care professional. Talk to your pediatrician regarding the use of this medicine in children. Special care may be needed. Overdosage: If you think you have taken too much of this medicine contact a poison control center or emergency room at once. NOTE: This medicine is only for you. Do not share this medicine with others. What if I miss a dose? It is important not to miss your dose. Call your doctor or health care professional  if you are unable to keep an appointment. What may interact with this medicine? Do not take this medicine with any of the following medications:  disulfiram  metronidazole This medicine may also interact with the following medications:  antiviral medicines for hepatitis, HIV or AIDS  certain antibiotics like erythromycin and clarithromycin  certain medicines for fungal infections like ketoconazole and itraconazole  certain medicines for seizures like carbamazepine, phenobarbital, phenytoin  gemfibrozil  nefazodone  rifampin  St. John's wort This list may not describe all possible interactions. Give your health care provider a list of all the medicines, herbs, non-prescription drugs, or dietary supplements you use. Also tell them if you smoke, drink alcohol, or use illegal drugs. Some items may interact with your medicine. What should I watch for while using this medicine? Your condition will be monitored carefully while you are receiving this medicine. You will need important blood work done while you are taking this medicine. This medicine can cause serious allergic reactions. To reduce your risk you will need to take other medicine(s) before treatment with this medicine. If you experience allergic reactions like skin rash, itching or hives, swelling of the face, lips, or tongue, tell your doctor or health care professional right away. In some cases, you may be given additional medicines to help with side effects. Follow all directions for their use. This drug may make you feel generally unwell. This is not uncommon, as chemotherapy can affect healthy cells as well as cancer cells. Report any side effects. Continue your course of treatment even though you feel ill unless your doctor tells you to stop. Call your doctor or health care professional for advice if you get a fever, chills or sore throat, or other symptoms of a cold or flu. Do not treat yourself. This drug decreases your  body's ability to fight infections. Try to avoid being around people who are sick. This medicine may increase your risk to bruise or bleed. Call your doctor or health care professional if you notice any unusual bleeding. Be careful brushing and flossing your teeth or using a toothpick because you may get an infection or bleed more easily. If you have any dental work done, tell your dentist you are receiving this medicine. Avoid taking products that contain aspirin, acetaminophen, ibuprofen, naproxen, or ketoprofen unless instructed by your doctor. These medicines may hide a fever. Do not become pregnant while taking this medicine. Women should inform their doctor if they wish to become pregnant or think they might be pregnant. There is a potential for serious side effects to an unborn child. Talk to your health care professional or pharmacist for more information. Do not breast-feed an infant while taking this medicine. Men are advised not to father a child while receiving this medicine. This product may contain alcohol. Ask your pharmacist or healthcare provider if this medicine contains alcohol. Be sure to tell all healthcare providers you are taking this medicine. Certain medicines, like metronidazole and disulfiram, can cause  an unpleasant reaction when taken with alcohol. The reaction includes flushing, headache, nausea, vomiting, sweating, and increased thirst. The reaction can last from 30 minutes to several hours. What side effects may I notice from receiving this medicine? Side effects that you should report to your doctor or health care professional as soon as possible:  allergic reactions like skin rash, itching or hives, swelling of the face, lips, or tongue  breathing problems  changes in vision  fast, irregular heartbeat  high or low blood pressure  mouth sores  pain, tingling, numbness in the hands or feet  signs of decreased platelets or bleeding - bruising, pinpoint red spots  on the skin, black, tarry stools, blood in the urine  signs of decreased red blood cells - unusually weak or tired, feeling faint or lightheaded, falls  signs of infection - fever or chills, cough, sore throat, pain or difficulty passing urine  signs and symptoms of liver injury like dark yellow or brown urine; general ill feeling or flu-like symptoms; light-colored stools; loss of appetite; nausea; right upper belly pain; unusually weak or tired; yellowing of the eyes or skin  swelling of the ankles, feet, hands  unusually slow heartbeat Side effects that usually do not require medical attention (report to your doctor or health care professional if they continue or are bothersome):  diarrhea  hair loss  loss of appetite  muscle or joint pain  nausea, vomiting  pain, redness, or irritation at site where injected  tiredness This list may not describe all possible side effects. Call your doctor for medical advice about side effects. You may report side effects to FDA at 1-800-FDA-1088. Where should I keep my medicine? This drug is given in a hospital or clinic and will not be stored at home. NOTE: This sheet is a summary. It may not cover all possible information. If you have questions about this medicine, talk to your doctor, pharmacist, or health care provider.  2020 Elsevier/Gold Standard (2016-11-12 13:14:55) Carboplatin injection What is this medicine? CARBOPLATIN (KAR boe pla tin) is a chemotherapy drug. It targets fast dividing cells, like cancer cells, and causes these cells to die. This medicine is used to treat ovarian cancer and many other cancers. This medicine may be used for other purposes; ask your health care provider or pharmacist if you have questions. COMMON BRAND NAME(S): Paraplatin What should I tell my health care provider before I take this medicine? They need to know if you have any of these conditions:  blood disorders  hearing problems  kidney  disease  recent or ongoing radiation therapy  an unusual or allergic reaction to carboplatin, cisplatin, other chemotherapy, other medicines, foods, dyes, or preservatives  pregnant or trying to get pregnant  breast-feeding How should I use this medicine? This drug is usually given as an infusion into a vein. It is administered in a hospital or clinic by a specially trained health care professional. Talk to your pediatrician regarding the use of this medicine in children. Special care may be needed. Overdosage: If you think you have taken too much of this medicine contact a poison control center or emergency room at once. NOTE: This medicine is only for you. Do not share this medicine with others. What if I miss a dose? It is important not to miss a dose. Call your doctor or health care professional if you are unable to keep an appointment. What may interact with this medicine?  medicines for seizures  medicines to increase blood  counts like filgrastim, pegfilgrastim, sargramostim  some antibiotics like amikacin, gentamicin, neomycin, streptomycin, tobramycin  vaccines Talk to your doctor or health care professional before taking any of these medicines:  acetaminophen  aspirin  ibuprofen  ketoprofen  naproxen This list may not describe all possible interactions. Give your health care provider a list of all the medicines, herbs, non-prescription drugs, or dietary supplements you use. Also tell them if you smoke, drink alcohol, or use illegal drugs. Some items may interact with your medicine. What should I watch for while using this medicine? Your condition will be monitored carefully while you are receiving this medicine. You will need important blood work done while you are taking this medicine. This drug may make you feel generally unwell. This is not uncommon, as chemotherapy can affect healthy cells as well as cancer cells. Report any side effects. Continue your course of  treatment even though you feel ill unless your doctor tells you to stop. In some cases, you may be given additional medicines to help with side effects. Follow all directions for their use. Call your doctor or health care professional for advice if you get a fever, chills or sore throat, or other symptoms of a cold or flu. Do not treat yourself. This drug decreases your body's ability to fight infections. Try to avoid being around people who are sick. This medicine may increase your risk to bruise or bleed. Call your doctor or health care professional if you notice any unusual bleeding. Be careful brushing and flossing your teeth or using a toothpick because you may get an infection or bleed more easily. If you have any dental work done, tell your dentist you are receiving this medicine. Avoid taking products that contain aspirin, acetaminophen, ibuprofen, naproxen, or ketoprofen unless instructed by your doctor. These medicines may hide a fever. Do not become pregnant while taking this medicine. Women should inform their doctor if they wish to become pregnant or think they might be pregnant. There is a potential for serious side effects to an unborn child. Talk to your health care professional or pharmacist for more information. Do not breast-feed an infant while taking this medicine. What side effects may I notice from receiving this medicine? Side effects that you should report to your doctor or health care professional as soon as possible:  allergic reactions like skin rash, itching or hives, swelling of the face, lips, or tongue  signs of infection - fever or chills, cough, sore throat, pain or difficulty passing urine  signs of decreased platelets or bleeding - bruising, pinpoint red spots on the skin, black, tarry stools, nosebleeds  signs of decreased red blood cells - unusually weak or tired, fainting spells, lightheadedness  breathing problems  changes in hearing  changes in  vision  chest pain  high blood pressure  low blood counts - This drug may decrease the number of white blood cells, red blood cells and platelets. You may be at increased risk for infections and bleeding.  nausea and vomiting  pain, swelling, redness or irritation at the injection site  pain, tingling, numbness in the hands or feet  problems with balance, talking, walking  trouble passing urine or change in the amount of urine Side effects that usually do not require medical attention (report to your doctor or health care professional if they continue or are bothersome):  hair loss  loss of appetite  metallic taste in the mouth or changes in taste This list may not describe all possible  side effects. Call your doctor for medical advice about side effects. You may report side effects to FDA at 1-800-FDA-1088. Where should I keep my medicine? This drug is given in a hospital or clinic and will not be stored at home. NOTE: This sheet is a summary. It may not cover all possible information. If you have questions about this medicine, talk to your doctor, pharmacist, or health care provider.  2020 Elsevier/Gold Standard (2007-06-16 14:38:05) Denosumab injection What is this medicine? DENOSUMAB (den oh sue mab) slows bone breakdown. Prolia is used to treat osteoporosis in women after menopause and in men, and in people who are taking corticosteroids for 6 months or more. Delton See is used to treat a high calcium level due to cancer and to prevent bone fractures and other bone problems caused by multiple myeloma or cancer bone metastases. Delton See is also used to treat giant cell tumor of the bone. This medicine may be used for other purposes; ask your health care provider or pharmacist if you have questions. COMMON BRAND NAME(S): Prolia, XGEVA What should I tell my health care provider before I take this medicine? They need to know if you have any of these conditions:  dental  disease  having surgery or tooth extraction  infection  kidney disease  low levels of calcium or Vitamin D in the blood  malnutrition  on hemodialysis  skin conditions or sensitivity  thyroid or parathyroid disease  an unusual reaction to denosumab, other medicines, foods, dyes, or preservatives  pregnant or trying to get pregnant  breast-feeding How should I use this medicine? This medicine is for injection under the skin. It is given by a health care professional in a hospital or clinic setting. A special MedGuide will be given to you before each treatment. Be sure to read this information carefully each time. For Prolia, talk to your pediatrician regarding the use of this medicine in children. Special care may be needed. For Delton See, talk to your pediatrician regarding the use of this medicine in children. While this drug may be prescribed for children as young as 13 years for selected conditions, precautions do apply. Overdosage: If you think you have taken too much of this medicine contact a poison control center or emergency room at once. NOTE: This medicine is only for you. Do not share this medicine with others. What if I miss a dose? It is important not to miss your dose. Call your doctor or health care professional if you are unable to keep an appointment. What may interact with this medicine? Do not take this medicine with any of the following medications:  other medicines containing denosumab This medicine may also interact with the following medications:  medicines that lower your chance of fighting infection  steroid medicines like prednisone or cortisone This list may not describe all possible interactions. Give your health care provider a list of all the medicines, herbs, non-prescription drugs, or dietary supplements you use. Also tell them if you smoke, drink alcohol, or use illegal drugs. Some items may interact with your medicine. What should I watch for  while using this medicine? Visit your doctor or health care professional for regular checks on your progress. Your doctor or health care professional may order blood tests and other tests to see how you are doing. Call your doctor or health care professional for advice if you get a fever, chills or sore throat, or other symptoms of a cold or flu. Do not treat yourself. This drug may  decrease your body's ability to fight infection. Try to avoid being around people who are sick. You should make sure you get enough calcium and vitamin D while you are taking this medicine, unless your doctor tells you not to. Discuss the foods you eat and the vitamins you take with your health care professional. See your dentist regularly. Brush and floss your teeth as directed. Before you have any dental work done, tell your dentist you are receiving this medicine. Do not become pregnant while taking this medicine or for 5 months after stopping it. Talk with your doctor or health care professional about your birth control options while taking this medicine. Women should inform their doctor if they wish to become pregnant or think they might be pregnant. There is a potential for serious side effects to an unborn child. Talk to your health care professional or pharmacist for more information. What side effects may I notice from receiving this medicine? Side effects that you should report to your doctor or health care professional as soon as possible:  allergic reactions like skin rash, itching or hives, swelling of the face, lips, or tongue  bone pain  breathing problems  dizziness  jaw pain, especially after dental work  redness, blistering, peeling of the skin  signs and symptoms of infection like fever or chills; cough; sore throat; pain or trouble passing urine  signs of low calcium like fast heartbeat, muscle cramps or muscle pain; pain, tingling, numbness in the hands or feet; seizures  unusual bleeding or  bruising  unusually weak or tired Side effects that usually do not require medical attention (report to your doctor or health care professional if they continue or are bothersome):  constipation  diarrhea  headache  joint pain  loss of appetite  muscle pain  runny nose  tiredness  upset stomach This list may not describe all possible side effects. Call your doctor for medical advice about side effects. You may report side effects to FDA at 1-800-FDA-1088. Where should I keep my medicine? This medicine is only given in a clinic, doctor's office, or other health care setting and will not be stored at home. NOTE: This sheet is a summary. It may not cover all possible information. If you have questions about this medicine, talk to your doctor, pharmacist, or health care provider.  2020 Elsevier/Gold Standard (2017-07-18 16:10:44)

## 2018-12-09 NOTE — Progress Notes (Signed)
73 year old female diagnosed with non-small cell lung cancer receiving chemotherapy.  She is a patient of Dr. Julien Nordmann.  Past medical history includes COPD, depression, sleep apnea, tobacco.  Medications include Lipitor, Pepcid, Prozac, and Compazine.  Labs include BUN 40, creatinine 1.22, albumin 3.8, sodium 132, potassium 3.2, and glucose 116.  Height: 5 feet 1 inch. Weight: 124.7 pounds September 9. Usual body weight: 144 pounds January 2020. BMI: 23.56.  Patient has lost 20 pounds since January 2020. Noted some nausea with several episodes of vomiting. Today is patient's first treatment and she has had a very long day. She is currently premedicated for her chemotherapy and is not able to answer questions clearly. She did tell me she will drink boost and Ensure and was agreeable to samples today.  Nutrition diagnosis: Unintended weight loss related to non-small cell lung cancer as evidenced by 20 pound weight weight loss over 9 months.  Intervention: Educated patient to try to eat smaller more frequent meals and snacks using high-calorie, high-protein foods. Provided samples of oral nutrition supplements with coupons and contact information. I left handouts for patient to refer to when she gets home.  Encouraged her to contact me with questions or concerns.  Monitoring, evaluation, goals: Patient will tolerate adequate calories and protein to minimize further weight loss.  Next visit: Wednesday, October 7 during infusion.  **Disclaimer: This note was dictated with voice recognition software. Similar sounding words can inadvertently be transcribed and this note may contain transcription errors which may not have been corrected upon publication of note.**

## 2018-12-10 ENCOUNTER — Telehealth: Payer: Self-pay | Admitting: *Deleted

## 2018-12-10 ENCOUNTER — Ambulatory Visit: Payer: 59

## 2018-12-10 ENCOUNTER — Encounter: Payer: Self-pay | Admitting: Internal Medicine

## 2018-12-10 NOTE — Telephone Encounter (Signed)
Called pt to see how she did with her treatment of Keytruda, Taxol, & Carboplatin yesterday.  She reports doing well.  She states she had some pain this am & took pain pill & pain improved.  She denies nausea/vomiting & states she slept well last hs.  Her main concern now is constipation.  We discussed diet, increasing fluids, exercise, stool softeners & Miralax.  She knows to call with any questions/concerns & expresses appreciation for call.

## 2018-12-10 NOTE — Telephone Encounter (Signed)
-----   Message from Flo Shanks, RN sent at 12/09/2018  4:17 PM EDT ----- Regarding: Dr. Julien Nordmann First time chemo First time Taxol, Carbo and Keytruda.Marland Kitchen

## 2018-12-10 NOTE — Progress Notes (Signed)
Called pt to introduce myself as her Arboriculturist.  Unfortunately there aren't any foundations offering copay assistance for her Dx.  I offered the [employer], went over what it covers and gave her the income requirement.  She wanted me to speak to her husband so I gave him the same information.  He stated they exceed the requirement so she doesn't qualify for the grant at this time.  I will meet her on 12/11/18 to give her my card for any questions or concerns she may have in the future.

## 2018-12-11 ENCOUNTER — Inpatient Hospital Stay: Payer: 59

## 2018-12-11 ENCOUNTER — Other Ambulatory Visit: Payer: Self-pay

## 2018-12-11 VITALS — BP 118/72 | HR 62 | Temp 98.2°F | Resp 18

## 2018-12-11 DIAGNOSIS — C3411 Malignant neoplasm of upper lobe, right bronchus or lung: Secondary | ICD-10-CM | POA: Diagnosis not present

## 2018-12-11 DIAGNOSIS — C3491 Malignant neoplasm of unspecified part of right bronchus or lung: Secondary | ICD-10-CM

## 2018-12-11 MED ORDER — PEGFILGRASTIM INJECTION 6 MG/0.6ML ~~LOC~~
6.0000 mg | PREFILLED_SYRINGE | Freq: Once | SUBCUTANEOUS | Status: AC
  Administered 2018-12-11: 6 mg via SUBCUTANEOUS

## 2018-12-11 NOTE — Patient Instructions (Signed)

## 2018-12-16 ENCOUNTER — Other Ambulatory Visit: Payer: Self-pay

## 2018-12-16 ENCOUNTER — Encounter: Payer: Self-pay | Admitting: Physician Assistant

## 2018-12-16 ENCOUNTER — Inpatient Hospital Stay: Payer: 59

## 2018-12-16 ENCOUNTER — Inpatient Hospital Stay (HOSPITAL_BASED_OUTPATIENT_CLINIC_OR_DEPARTMENT_OTHER): Payer: 59 | Admitting: Physician Assistant

## 2018-12-16 VITALS — BP 110/76 | HR 88 | Temp 98.3°F | Resp 18 | Ht 61.0 in | Wt 127.6 lb

## 2018-12-16 DIAGNOSIS — K5900 Constipation, unspecified: Secondary | ICD-10-CM | POA: Diagnosis not present

## 2018-12-16 DIAGNOSIS — E876 Hypokalemia: Secondary | ICD-10-CM | POA: Diagnosis not present

## 2018-12-16 DIAGNOSIS — Z23 Encounter for immunization: Secondary | ICD-10-CM

## 2018-12-16 DIAGNOSIS — C3491 Malignant neoplasm of unspecified part of right bronchus or lung: Secondary | ICD-10-CM

## 2018-12-16 DIAGNOSIS — C3411 Malignant neoplasm of upper lobe, right bronchus or lung: Secondary | ICD-10-CM | POA: Diagnosis not present

## 2018-12-16 LAB — CMP (CANCER CENTER ONLY)
ALT: 8 U/L (ref 0–44)
AST: 12 U/L — ABNORMAL LOW (ref 15–41)
Albumin: 3.6 g/dL (ref 3.5–5.0)
Alkaline Phosphatase: 120 U/L (ref 38–126)
Anion gap: 10 (ref 5–15)
BUN: 25 mg/dL — ABNORMAL HIGH (ref 8–23)
CO2: 27 mmol/L (ref 22–32)
Calcium: 9.9 mg/dL (ref 8.9–10.3)
Chloride: 99 mmol/L (ref 98–111)
Creatinine: 0.83 mg/dL (ref 0.44–1.00)
GFR, Est AFR Am: >60 mL/min (ref >60)
GFR, Estimated: >60 mL/min (ref >60)
Glucose, Bld: 100 mg/dL — ABNORMAL HIGH (ref 70–99)
Potassium: 3.3 mmol/L — ABNORMAL LOW (ref 3.5–5.1)
Sodium: 136 mmol/L (ref 135–145)
Total Bilirubin: 0.3 mg/dL (ref 0.3–1.2)
Total Protein: 7 g/dL (ref 6.5–8.1)

## 2018-12-16 LAB — CBC WITH DIFFERENTIAL (CANCER CENTER ONLY)
Abs Immature Granulocytes: 0 10*3/uL (ref 0.00–0.07)
Basophils Absolute: 0 10*3/uL (ref 0.0–0.1)
Basophils Relative: 0 %
Eosinophils Absolute: 0.4 10*3/uL (ref 0.0–0.5)
Eosinophils Relative: 2 %
HCT: 34.3 % — ABNORMAL LOW (ref 36.0–46.0)
Hemoglobin: 11.7 g/dL — ABNORMAL LOW (ref 12.0–15.0)
Lymphocytes Relative: 32 %
Lymphs Abs: 6.2 10*3/uL — ABNORMAL HIGH (ref 0.7–4.0)
MCH: 31.8 pg (ref 26.0–34.0)
MCHC: 34.1 g/dL (ref 30.0–36.0)
MCV: 93.2 fL (ref 80.0–100.0)
Monocytes Absolute: 3.1 10*3/uL — ABNORMAL HIGH (ref 0.1–1.0)
Monocytes Relative: 16 %
Neutro Abs: 9.8 10*3/uL (ref 1.7–17.7)
Neutrophils Relative %: 50 %
Platelet Count: 282 10*3/uL (ref 150–400)
RBC: 3.68 MIL/uL — ABNORMAL LOW (ref 3.87–5.11)
RDW: 13.4 % (ref 11.5–15.5)
WBC Count: 19.5 10*3/uL — ABNORMAL HIGH (ref 4.0–10.5)
nRBC: 0 % (ref 0.0–0.2)

## 2018-12-16 MED ORDER — INFLUENZA VAC A&B SA ADJ QUAD 0.5 ML IM PRSY
PREFILLED_SYRINGE | INTRAMUSCULAR | Status: AC
  Filled 2018-12-16: qty 0.5

## 2018-12-16 MED ORDER — INFLUENZA VAC A&B SA ADJ QUAD 0.5 ML IM PRSY: 0.5000 mL | PREFILLED_SYRINGE | Freq: Once | INTRAMUSCULAR | Status: DC

## 2018-12-16 MED ORDER — INFLUENZA VAC A&B SA ADJ QUAD 0.5 ML IM PRSY
0.5000 mL | PREFILLED_SYRINGE | Freq: Once | INTRAMUSCULAR | Status: AC
  Administered 2018-12-16: 0.5 mL via INTRAMUSCULAR

## 2018-12-16 NOTE — Patient Instructions (Signed)
It is common for patients who are undergoing treatment and taking certain prescribed medications to experience side-effects with constipation.  If you experience constipation, please take stool softener such as Colace or Senna one tablet twice a day everyday to avoid constipation.  These medications are available over the counter.  Of course, if you have diarrhea, stop taking stool softeners.  Drinking plenty of fluid, eating fruits and vegetable, and being active also reduces the risk of constipation.   If despite taking stool softeners, and you still have no bowel movement for 2 days or more than your normal bowel habit frequency, please take one of the following over the counter laxatives:  MiraLax, Milk of Magnesia or Mag Citrate everyday and contact me immediately for further instructions.  The goal is to have at least one bowel movement every other day.

## 2018-12-16 NOTE — Progress Notes (Signed)
[facility] OFFICE PROGRESS NOTE  [name], MD [place] [address]  DIAGNOSIS: Highly suspicious metastatic lung cancer presented with right upper lobe lung mass in addition to pleural-based metastasis andmediastinal lymphadenopathy.   PRIOR THERAPY: None  CURRENT THERAPY: Chemotherapy with carboplatin for an AUC of 5, paclitaxel 175 mg/m, and Keytruda 200 mg IV every 3 weeks with Neulasta support.  First treatment on 12/09/2018.  INTERVAL HISTORY: Debra Rodgers 73 y.o. female returns to the clinic for a follow-up visit.  The patient is feeling well today without any concerning complaints except for continued right lower rib/RUQ pain secondary to her pleural based metastasis which radiates to her back. She takes Norco for pain. She notes frequent constipation and states she uses prune juice. Her last bowel movement was last night and she states it was "half of a bowel movement". She denies any nausea or vomiting. She denies any cough, hemoptysis, or worsening shortness of breath from her baseline.  She is status post her first cycle of systemic chemotherapy with carboplatin, paclitaxel, and Keytruda. She tolerated it well without any adverse side effects. She denies any fever, chills, night sweats, or weight loss. Denies any signs and symptoms of infection including sore throat, nasal congestion, skin infections, or dysuria. She recently met with the cancer center's nutritionist regarding her previous weight loss. She gained a few pounds since her last visit and states that she drinks 1-2 ensures/boost per day. She denies any headache or visual changes.  She denies any rashes or skin changes. The patient's labs last week showed hypercalcemia likely secondary to her malignancy. She received Xgeva, fluids, and  20 mg of lasix.  She is here today for evaluation and a 1 week follow-up after completing her first cycle of chemotherapy.  MEDICAL  HISTORY: Past Medical History:  Diagnosis Date  . COPD (chronic obstructive pulmonary disease) ([place])   . Depression   . Essential hypertension   . Headache   . History of migraine headaches   . Sleep apnea   . Tobacco use disorder     ALLERGIES:  is allergic to codeine sulfate and pantoprazole sodium.  MEDICATIONS:  Current Outpatient Medications  Medication Sig Dispense Refill  . acetaminophen (TYLENOL) 325 MG tablet Take 2 tablets (650 mg total) by mouth every 6 (six) hours as needed. 30 tablet 0  . amLODipine (NORVASC) 10 MG tablet TAKE 1 TABLET BY MOUTH EVERY DAY 30 tablet 5  . aspirin 81 MG EC tablet Take 81 mg by mouth daily.      Marland Kitchen atorvastatin (LIPITOR) 20 MG tablet TAKE 1 TABLET BY MOUTH EVERY DAY 90 tablet 3  . chlorthalidone (HYGROTON) 50 MG tablet TAKE 1 TABLET BY MOUTH EVERY DAY 90 tablet 1  . famotidine (PEPCID) 20 MG tablet Take 20 mg by mouth daily.     Marland Kitchen FLUoxetine (PROZAC) 20 MG capsule TAKE 1 CAPSULE (20 MG TOTAL) DAILY BY MOUTH. 30 capsule 5  . gabapentin (NEURONTIN) 100 MG capsule Take 1 capsule (100 mg total) by mouth 2 (two) times daily. 180 capsule 3  . HYDROcodone-acetaminophen (NORCO) 7.5-325 MG tablet Take 1 tablet by mouth every 6 (six) hours as needed for moderate pain. 30 tablet 0  . lisinopril (ZESTRIL) 10 MG tablet TAKE 1 TABLET BY MOUTH EVERY DAY 90 tablet 1  . nicotine ([employer] NICOTINE TRANSDERMAL SYS) 14 mg/24hr patch Place 1 patch (14 mg total) onto the skin daily. (Patient not taking: Reported on  12/02/2018) 42 patch 0  . prochlorperazine (COMPAZINE) 10 MG tablet Take 1 tablet (10 mg total) by mouth every 6 (six) hours as needed for nausea or vomiting. 30 tablet 2  . VENTOLIN HFA 108 (90 Base) MCG/ACT inhaler INHALE 1-2 PUFFS INTO THE LUNGS EVERY 6 (SIX) HOURS AS NEEDED FOR WHEEZING OR SHORTNESS OF BREATH. 18 Inhaler 0   No current facility-administered medications for this visit.     SURGICAL HISTORY:  Past Surgical History:  Procedure Laterality  Date  . ABDOMINAL HYSTERECTOMY    . CHOLECYSTECTOMY    . ROTATOR CUFF REPAIR  3/04  . VIDEO BRONCHOSCOPY WITH ENDOBRONCHIAL ULTRASOUND Right 11/25/2018   Procedure: VIDEO BRONCHOSCOPY WITH ENDOBRONCHIAL ULTRASOUND;  Surgeon: Collene Gobble, MD;  Location: [facility] OR;  Service: Thoracic;  Laterality: Right;    REVIEW OF SYSTEMS:   Review of Systems  Constitutional: Negative for appetite change, chills, fatigue, fever and unexpected weight change.  HENT: Negative for mouth sores, nosebleeds, sore throat and trouble swallowing.   Eyes: Negative for eye problems and icterus.  Respiratory: Positive for baseline shortness of breath. Negative for cough, hemoptysis, and wheezing.   Cardiovascular: Positive for right anterior chest pain/RUQ. Negative for leg swelling.  Gastrointestinal: Positive for abdominal discomfort RUQ, constipation, and gas pain. Negative for diarrhea, nausea and vomiting.  Genitourinary: Negative for bladder incontinence, difficulty urinating, dysuria, frequency and hematuria.   Musculoskeletal: Negative for back pain, gait problem, neck pain and neck stiffness.  Skin: Negative for itching and rash.  Neurological: Negative for dizziness, extremity weakness, gait problem, headaches, light-headedness and seizures.  Hematological: Negative for adenopathy. Does not bruise/bleed easily.  Psychiatric/Behavioral: Negative for confusion, depression and sleep disturbance. The patient is not nervous/anxious.     PHYSICAL EXAMINATION:  Blood pressure 110/76, pulse 88, temperature 98.3 F (36.8 C), temperature source Oral, resp. rate 18, height 5' 1"  (1.549 m), weight 127 lb 9.6 oz (57.9 kg), SpO2 99 %.  ECOG PERFORMANCE STATUS: 1 - Symptomatic but completely ambulatory  Physical Exam  Constitutional: Oriented to person, place, and time and thin appearing female and in no distress.  HENT:  Head: Normocephalic and atraumatic.  Mouth/Throat: Oropharynx is clear and moist. No oropharyngeal  exudate.  Eyes: Conjunctivae are normal. Right eye exhibits no discharge. Left eye exhibits no discharge. No scleral icterus.  Neck: Normal range of motion. Neck supple.  Cardiovascular: Normal rate, regular rhythm, normal heart sounds and intact distal pulses.   Pulmonary/Chest: Effort normal and breath sounds normal. No respiratory distress. No wheezes. No rales.  Abdominal: Soft. Mild tenderness to palpation Hypoactive bowel sounds. Exhibits no distension and no mass.  Musculoskeletal: Normal range of motion. Exhibits no edema.  Lymphadenopathy:    No cervical adenopathy.  Neurological: Alert and oriented to person, place, and time. Exhibits normal muscle tone. Gait normal. Coordination normal. The patient was examined in the wheelchair.  Skin: Skin is warm and dry. No rash noted. Not diaphoretic. No erythema. No pallor.  Psychiatric: Mood, memory and judgment normal.  Vitals reviewed.  LABORATORY DATA: Lab Results  Component Value Date   WBC 19.5 (H) 12/16/2018   HGB 11.7 (L) 12/16/2018   HCT 34.3 (L) 12/16/2018   MCV 93.2 12/16/2018   PLT 282 12/16/2018      Chemistry      Component Value Date/Time   NA 136 12/16/2018 1021   NA 139 07/21/2017 0958   K 3.3 (L) 12/16/2018 1021   CL 99 12/16/2018 1021   CO2 27 12/16/2018  1021   BUN 25 (H) 12/16/2018 1021   BUN 26 07/21/2017 0958   CREATININE 0.83 12/16/2018 1021   CREATININE 0.79 09/03/2013 1540      Component Value Date/Time   CALCIUM 9.9 12/16/2018 1021   ALKPHOS 120 12/16/2018 1021   AST 12 (L) 12/16/2018 1021   ALT 8 12/16/2018 1021   BILITOT 0.3 12/16/2018 1021       RADIOGRAPHIC STUDIES:  Mr Jeri Cos TD Contrast  Result Date: 11/19/2018 CLINICAL DATA:  Lung cancer staging EXAM: MRI HEAD WITHOUT AND WITH CONTRAST TECHNIQUE: Multiplanar, multiecho pulse sequences of the brain and surrounding structures were obtained without and with intravenous contrast. CONTRAST:  6 mL Gadavist COMPARISON:  Brain MRI  10/06/2013 FINDINGS: BRAIN: There is no acute infarct, acute hemorrhage or extra-axial collection. Multifocal white matter hyperintensity, most commonly due to chronic ischemic microangiopathy. The cerebral and cerebellar volume are age-appropriate. There is no hydrocephalus. The midline structures are normal. There is no abnormal contrast enhancement. VASCULAR: The major intracranial arterial and venous sinus flow voids are normal. Susceptibility-sensitive sequences show no chronic microhemorrhage or superficial siderosis. SKULL AND UPPER CERVICAL SPINE: Calvarial bone marrow signal is normal. There is no skull base mass. The visualized upper cervical spine and soft tissues are normal. SINUSES/ORBITS: There are no fluid levels or advanced mucosal thickening. The mastoid air cells and middle ear cavities are free of fluid. The orbits are normal. IMPRESSION: 1. No intracranial or calvarial metastatic disease. 2. Chronic small vessel ischemia. Electronically Signed   By: Ulyses Jarred M.D.   On: 11/19/2018 20:19   Nm Pet Image Initial (pi) Skull Base To Thigh  Result Date: 11/18/2018 CLINICAL DATA:  Initial treatment strategy for right-sided pulmonary and pleural lesions suspicious for primary bronchogenic carcinoma and metastasis. Thoracic adenopathy. EXAM: NUCLEAR MEDICINE PET SKULL BASE TO THIGH TECHNIQUE: 6.1 mCi F-18 FDG was injected intravenously. Full-ring PET imaging was performed from the skull base to thigh after the radiotracer. CT data was obtained and used for attenuation correction and anatomic localization. Fasting blood glucose: 85 mg/dl COMPARISON:  Chest CT 11/06/2018.  Abdominopelvic CT 11/05/2018. FINDINGS: Mediastinal blood pool activity: SUV max 2.2 Liver activity: SUV max NA NECK: No areas of abnormal hypermetabolism. Incidental CT findings: Apparent right nasopharyngeal soft tissue fullness is favored to be due to obliquity, without hypermetabolic correlate. No cervical adenopathy.  Bilateral carotid atherosclerosis. CHEST: Irregular right-sided pleural thickening and hypermetabolism. Example area of diffuse thickening posteriorly and medially at 1.3 cm corresponding to hypermetabolism at a S.U.V. max of 13.1. Example image 53/4. A probable pulmonary parenchymal lesion in the right middle lobe measures 3.7 x 4.0 cm and a S.U.V. max of 15.9 on 76/4. Extensive right-sided mediastinal adenopathy. Example right paratracheal node of 2.4 cm and a S.U.V. max of 18.8 on 54/4. Subcarinal adenopathy at 1.9 cm and a S.U.V. max of 17.7. Incidental CT findings: Deferred to recent diagnostic CT. Centrilobular and paraseptal emphysema. Aortic and coronary artery atherosclerosis. ABDOMEN/PELVIS: No abdominopelvic parenchymal or nodal hypermetabolism. Incidental CT findings: Deferred to abdominopelvic CT. Normal adrenal glands. Left pelvic, likely ovarian cystic lesion at 5.7 cm, as on prior CT. SKELETON: No abnormal marrow activity. Incidental CT findings: Cervical spine fixation. IMPRESSION: 1. Extensive right-sided pleural and thoracic nodal hypermetabolic disease, as detailed above. Concurrent likely parenchymal right middle lobe hypermetabolic lung mass. Favor primary bronchogenic carcinoma with pleural and nodal metastasis. Mesothelioma could look similar, but is felt less likely, given absence of asbestos related pleural disease. 2. No evidence of  extrathoracic hypermetabolic metastasis. 3. Aortic atherosclerosis (ICD10-I70.0), coronary artery atherosclerosis and emphysema (ICD10-J43.9). 4. Left pelvic/ovarian cystic lesion, as on diagnostic CT. Electronically Signed   By: Abigail Miyamoto M.D.   On: 11/18/2018 16:13     ASSESSMENT/PLAN:  This is a very pleasant 73 year old African-American female diagnosed with stage IV non-small cell lung cancer, squamous cell carcinoma.  She presented with a right upper lobe lung mass and pleural based metastases and mediastinal lymphadenopathy. She was diagnosed in  August 2020   The patient is currently undergoing systemic chemotherapy with carboplatin for an AUC of 5, paclitaxel 175 mg/m, and Keytruda 200 mg IV every 3 weeks with Neulasta support.  She is status post her first cycle of treatment and she tolerated it well without any concerning adverse effects.  The patient was seen with Dr. Julien Nordmann. Labs were reviewed. The patient's calcium was within normal limits today. The patient's potassium is slightly low at 3.3 today. The patient's WBC is elevated secondary to her neulasta injection. Dr. Julien Nordmann recommends that the patient proceed with cycle #2 in two weeks.   We will see the patient back in 2 weeks for evaluation before starting cycle #2.   The patient will continue to use compazine 10 mg p.o. every 6 hours as needed for nausea.  We will continue to montior her electrolytes closely on weekly labs. The patient was provided a handout on potassium rich food and instructed to increase her dietary intake of potassium.   The patient recently met with nutrition and will follow their recommendations. They will continue to follow up with her periodically. She will continue drinking boost/ensure.   She will continue her pain management regimen with tylenol and to use norco when needed. She tries to use norco sparingly due to her frequent constipation.   I discussed constipation management with the patient again today. We discussed taking a stool softener such as Colace or Senna one tablet twice a day everyday to avoid constipation. Of course, if she develops diarrhea, she knows to stop taking stool softeners.  We also discussed that drinking plenty of fluid, eating fruits and vegetable, and being active also reduces the risk of constipation.   If she has not had a bowel movement for 2 days or more than her normal bowel habit frequency, she knows to take one of the following over the counter laxatives:  MiraLax, Milk of Magnesia or Mag Citrate. The goal is  to have at least one bowel movement every other day.   The patient will receive a flu shot while in the clinic today.   The patient was advised to call immediately if she has any concerning symptoms in the interval. The patient voices understanding of current disease status and treatment options and is in agreement with the current care plan. All questions were answered. The patient knows to call the clinic with any problems, questions or concerns. We can certainly see the patient much sooner if necessary No orders of the defined types were placed in this encounter.    Efton Thomley L Sussie Minor, PA-C 12/16/18  ADDENDUM: Hematology/Oncology Attending: I had a face-to-face encounter with the patient today.  I recommended her care plan.  This is a very pleasant 73 years old African-American female recently diagnosed with a stage IV non-small cell lung cancer, squamous cell carcinoma.  She is currently undergoing systemic chemotherapy with carboplatin, paclitaxel and Keytruda status post 1 cycle started last week.  She tolerated the first week of her treatment well with  no concerning adverse effects.  She has no nausea or vomiting.  She denied having any current chest pain or shortness of breath. I recommended for the patient to continue her treatment as planned and she is expected to start cycle #2 in 2 weeks. She will come back for follow-up visit at that time. She was advised to call immediately if she has any concerning symptoms in the interval.  Disclaimer: This note was dictated with voice recognition software. Similar sounding words can inadvertently be transcribed and may be missed upon review. Eilleen Kempf, MD 12/16/18

## 2018-12-17 ENCOUNTER — Ambulatory Visit: Payer: 59 | Admitting: Emergency Medicine

## 2018-12-17 ENCOUNTER — Ambulatory Visit (INDEPENDENT_AMBULATORY_CARE_PROVIDER_SITE_OTHER)
Admission: RE | Admit: 2018-12-17 | Discharge: 2018-12-17 | Disposition: A | Discharge status: Home / Self Care | Payer: 59 | Source: Ambulatory Visit | Attending: Emergency Medicine | Admitting: Emergency Medicine

## 2018-12-17 ENCOUNTER — Encounter: Payer: Self-pay | Admitting: Emergency Medicine

## 2018-12-17 VITALS — BP 110/62 | HR 90 | Temp 97.3°F | Ht 61.0 in | Wt 128.4 lb

## 2018-12-17 DIAGNOSIS — R918 Other nonspecific abnormal finding of lung field: Secondary | ICD-10-CM | POA: Diagnosis not present

## 2018-12-17 DIAGNOSIS — C3491 Malignant neoplasm of unspecified part of right bronchus or lung: Secondary | ICD-10-CM

## 2018-12-17 DIAGNOSIS — J439 Emphysema, unspecified: Secondary | ICD-10-CM

## 2018-12-17 DIAGNOSIS — Z720 Tobacco use: Secondary | ICD-10-CM

## 2018-12-17 MED ORDER — STIOLTO RESPIMAT 2.5-2.5 MCG/ACT IN AERS: 2.0000 | INHALATION_SPRAY | Freq: Every day | RESPIRATORY_TRACT | 0 refills | Status: DC

## 2018-12-17 NOTE — Progress Notes (Signed)
Subjective:    Patient ID: Debra Rodgers, female    DOB: 08-01-1945, 73 y.o.   MRN: [id number]  HPI 73 year old woman, smoker (35 pack years) with hypertension, migraines.  Referred today by Dr. Earlie Server for an abnormal CT scan of the chest.  She has been experiencing bilateral rib pain, upper left abdominal pain since 2-3 weeks ago.  She was seen in the emergency department and a CT scan of her abdomen and chest revealed a right middle lobe mass, 4.1 cm with bilateral pleural-based nodules and small effusion, thickening, mediastinal lymphadenopathy.  The left lung was uninvolved.  This prompted PET scan on 8/26 which I reviewed and which shows hypermetabolism in the right middle lobe mass, the pleural nodules and in the mediastinum.  There was no evidence of extrathoracic hypermetabolism seen.  Finally an MRI brain was done on 8/27 that I reviewed, shows no intracranial metastatic disease.  She presents today to address her abnormal CT. She denies any breathing limitations. She does pace herself with walking, due to fatigue and some dizziness. She paces herself to do housework. She has occasional cough. Has some clear sputum, never hemoptysis. Her cigarettes are down to 2-3 a day. Has albuterol, uses it a few times a week.   ROV 12/17/2018 --this a follow-up visit for 73 year old smoker. She has COPD, OSA. We performed bronchoscopy for evaluation of right middle lobe mass with pleural-based nodular disease and mediastinal lymphadenopathy on 11/25/2018.  The pathology is consistent with squamous cell lung cancer.  She was planned to initiate carboplatin, paclitaxel, Keytruda with Neulasta support.  She is having bilateral costal marginal pain, worst when she lays down. She has exertional SOB. Uses albuterol rarely. Pneumonia vaccine UTD. She had the flu shot yesterday She is reliable with CPAP qhs.   She has mixed obstruction and restriction on PFT from 10/09/2015.  She not on any scheduled bronchodilator  therapy currently.  She hasn't smoked since 11/04/2018   Review of Systems  Constitutional: Negative for fever and unexpected weight change.  HENT: Negative for congestion, dental problem, ear pain, nosebleeds, postnasal drip, rhinorrhea, sinus pressure, sneezing, sore throat and trouble swallowing.   Eyes: Negative for redness and itching.  Respiratory: Negative for cough, chest tightness, shortness of breath and wheezing.   Cardiovascular: Positive for chest pain. Negative for palpitations and leg swelling.  Gastrointestinal: Negative for nausea and vomiting.  Genitourinary: Negative for dysuria.  Musculoskeletal: Negative for joint swelling.  Skin: Negative for rash.  Neurological: Negative for headaches.  Hematological: Does not bruise/bleed easily.  Psychiatric/Behavioral: Negative for dysphoric mood. The patient is not nervous/anxious.          Objective:   Physical Exam  Vitals:   12/17/18 1049 12/17/18 1050  BP:  110/62  Pulse:  90  Temp: (!) 97.3 F (36.3 C)   TempSrc: Temporal   SpO2:  98%  Weight: 128 lb 6.4 oz (58.2 kg)   Height: 5' 1"  (1.549 m)    Gen: Pleasant, well-nourished, in no distress,  normal affect  ENT: No lesions,  mouth clear,  oropharynx clear, no postnasal drip  Neck: No JVD, no stridor  Lungs: No use of accessory muscles, no crackles or wheezing on normal respiration, no wheeze on forced expiration  Cardiovascular: RRR, heart sounds normal, no murmur or gallops, no peripheral edema  Musculoskeletal: No deformities, no cyanosis or clubbing; some R and L costal margin tenderness to palpation. No masses or nodules seen  Neuro: alert, awake, non focal  Skin: Warm, no lesions or rash     Assessment & Plan:  OSA (obstructive sleep apnea) Good compliance with CPAP.  Good clinical benefit.  Continue same  COPD (chronic obstructive pulmonary disease) ([place]) Evidence for COPD on her PFT from 2017.  She does have exertional dyspnea,  multifactorial but certainly obstruction is part of this.  We will start Stiolto to see if she gets benefit.  Continue albuterol as needed.  Her immunizations are up-to-date.  Stage IV squamous cell carcinoma of right lung ([place]) Chest x-ray today to evaluate for interval stability.  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  Tobacco use She has not smoked since August.  I congratulated her on this.  Support her cessation   Baltazar Apo, MD, PhD 12/17/2018, 11:07 AM [facility] [phone] or if no answer [phone]

## 2018-12-17 NOTE — Patient Instructions (Addendum)
Congratulations on staying off cigarettes!  This is great news. Continue to follow with oncology, get your chemotherapy as planned. Chest x-ray today Please keep your albuterol available to use 2 puffs if needed for shortness of breath, chest tightness, wheezing. We will try starting Stiolto 2 puffs once daily to see if this helps your overall breathing.  If so please call us and let us know and we will send a prescription to your pharmacy so you can continue it going forward. Pneumonia shot and flu shot are both up-to-date Follow with Dr Lamonte Sakai in 6 months or sooner if you have any problems

## 2018-12-17 NOTE — Assessment & Plan Note (Signed)
Chest x-ray today to evaluate for interval stability.  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

## 2018-12-17 NOTE — Assessment & Plan Note (Signed)
She has not smoked since August.  I congratulated her on this.  Support her cessation

## 2018-12-17 NOTE — Assessment & Plan Note (Signed)
Good compliance with CPAP.  Good clinical benefit.  Continue same

## 2018-12-17 NOTE — Assessment & Plan Note (Signed)
Evidence for COPD on her PFT from 2017.  She does have exertional dyspnea, multifactorial but certainly obstruction is part of this.  We will start Stiolto to see if she gets benefit.  Continue albuterol as needed.  Her immunizations are up-to-date.

## 2018-12-21 ENCOUNTER — Telehealth: Payer: Self-pay | Admitting: *Deleted

## 2018-12-21 ENCOUNTER — Other Ambulatory Visit: Payer: Self-pay | Admitting: Internal Medicine

## 2018-12-21 MED ORDER — HYDROCODONE-ACETAMINOPHEN 7.5-325 MG PO TABS: 1.0000 | ORAL_TABLET | Freq: Four times a day (QID) | ORAL | 0 refills | Status: DC | PRN

## 2018-12-21 NOTE — Telephone Encounter (Signed)
Pt called requesting refill of Hydrocodone  7.5/325 .  Pt is completely out of meds.  Pt uses  [facility] on [address]. Pt's    Phone     [phone].Marland Kitchen

## 2018-12-23 ENCOUNTER — Other Ambulatory Visit: Payer: Self-pay

## 2018-12-23 ENCOUNTER — Other Ambulatory Visit: Payer: Self-pay | Admitting: *Deleted

## 2018-12-23 ENCOUNTER — Inpatient Hospital Stay: Payer: 59

## 2018-12-23 DIAGNOSIS — C3411 Malignant neoplasm of upper lobe, right bronchus or lung: Secondary | ICD-10-CM | POA: Diagnosis not present

## 2018-12-23 DIAGNOSIS — I10 Essential (primary) hypertension: Secondary | ICD-10-CM

## 2018-12-23 DIAGNOSIS — C3491 Malignant neoplasm of unspecified part of right bronchus or lung: Secondary | ICD-10-CM

## 2018-12-23 LAB — CBC WITH DIFFERENTIAL (CANCER CENTER ONLY)
Abs Immature Granulocytes: 0.14 10*3/uL — ABNORMAL HIGH (ref 0.00–0.07)
Basophils Absolute: 0.1 10*3/uL (ref 0.0–0.1)
Basophils Relative: 1 %
Eosinophils Absolute: 0 10*3/uL (ref 0.0–0.5)
Eosinophils Relative: 0 %
HCT: 31.4 % — ABNORMAL LOW (ref 36.0–46.0)
Hemoglobin: 10.9 g/dL — ABNORMAL LOW (ref 12.0–15.0)
Immature Granulocytes: 1 %
Lymphocytes Relative: 12 %
Lymphs Abs: 1.4 10*3/uL (ref 0.7–4.0)
MCH: 32.2 pg (ref 26.0–34.0)
MCHC: 34.7 g/dL (ref 30.0–36.0)
MCV: 92.6 fL (ref 80.0–100.0)
Monocytes Absolute: 0.9 10*3/uL (ref 0.1–1.0)
Monocytes Relative: 8 %
Neutro Abs: 8.9 10*3/uL — ABNORMAL HIGH (ref 1.7–7.7)
Neutrophils Relative %: 78 %
Platelet Count: 244 10*3/uL (ref 150–400)
RBC: 3.39 MIL/uL — ABNORMAL LOW (ref 3.87–5.11)
RDW: 13.6 % (ref 11.5–15.5)
WBC Count: 11.4 10*3/uL — ABNORMAL HIGH (ref 4.0–10.5)
nRBC: 0 % (ref 0.0–0.2)

## 2018-12-23 LAB — CMP (CANCER CENTER ONLY)
ALT: 6 U/L (ref 0–44)
AST: 9 U/L — ABNORMAL LOW (ref 15–41)
Albumin: 3.1 g/dL — ABNORMAL LOW (ref 3.5–5.0)
Alkaline Phosphatase: 97 U/L (ref 38–126)
Anion gap: 9 (ref 5–15)
BUN: 15 mg/dL (ref 8–23)
CO2: 25 mmol/L (ref 22–32)
Calcium: 10 mg/dL (ref 8.9–10.3)
Chloride: 99 mmol/L (ref 98–111)
Creatinine: 0.74 mg/dL (ref 0.44–1.00)
GFR, Est AFR Am: >60 mL/min (ref >60)
GFR, Estimated: >60 mL/min (ref >60)
Glucose, Bld: 115 mg/dL — ABNORMAL HIGH (ref 70–99)
Potassium: 3.8 mmol/L (ref 3.5–5.1)
Sodium: 133 mmol/L — ABNORMAL LOW (ref 135–145)
Total Bilirubin: 0.4 mg/dL (ref 0.3–1.2)
Total Protein: 7.4 g/dL (ref 6.5–8.1)

## 2018-12-24 MED ORDER — AMLODIPINE BESYLATE 10 MG PO TABS: 10.0000 mg | ORAL_TABLET | Freq: Every day | ORAL | 1 refills | Status: DC

## 2018-12-30 ENCOUNTER — Other Ambulatory Visit: Payer: Self-pay

## 2018-12-30 ENCOUNTER — Inpatient Hospital Stay (HOSPITAL_BASED_OUTPATIENT_CLINIC_OR_DEPARTMENT_OTHER): Payer: 59 | Admitting: Physician Assistant

## 2018-12-30 ENCOUNTER — Inpatient Hospital Stay: Payer: 59

## 2018-12-30 ENCOUNTER — Inpatient Hospital Stay: Payer: 59 | Admitting: Nutrition

## 2018-12-30 ENCOUNTER — Encounter: Payer: Self-pay | Admitting: Physician Assistant

## 2018-12-30 ENCOUNTER — Other Ambulatory Visit: Payer: Self-pay | Admitting: Internal Medicine

## 2018-12-30 ENCOUNTER — Inpatient Hospital Stay: Payer: 59 | Attending: Physician Assistant

## 2018-12-30 VITALS — BP 114/77 | HR 93 | Temp 98.7°F | Resp 18 | Ht 61.0 in | Wt 124.9 lb

## 2018-12-30 DIAGNOSIS — C3411 Malignant neoplasm of upper lobe, right bronchus or lung: Secondary | ICD-10-CM | POA: Insufficient documentation

## 2018-12-30 DIAGNOSIS — Z79899 Other long term (current) drug therapy: Secondary | ICD-10-CM | POA: Diagnosis not present

## 2018-12-30 DIAGNOSIS — C3491 Malignant neoplasm of unspecified part of right bronchus or lung: Secondary | ICD-10-CM

## 2018-12-30 DIAGNOSIS — Z5112 Encounter for antineoplastic immunotherapy: Secondary | ICD-10-CM | POA: Insufficient documentation

## 2018-12-30 DIAGNOSIS — K5900 Constipation, unspecified: Secondary | ICD-10-CM | POA: Diagnosis not present

## 2018-12-30 DIAGNOSIS — Z5111 Encounter for antineoplastic chemotherapy: Secondary | ICD-10-CM | POA: Diagnosis not present

## 2018-12-30 DIAGNOSIS — Z7982 Long term (current) use of aspirin: Secondary | ICD-10-CM | POA: Diagnosis not present

## 2018-12-30 DIAGNOSIS — F329 Major depressive disorder, single episode, unspecified: Secondary | ICD-10-CM | POA: Diagnosis not present

## 2018-12-30 DIAGNOSIS — Z8673 Personal history of transient ischemic attack (TIA), and cerebral infarction without residual deficits: Secondary | ICD-10-CM | POA: Insufficient documentation

## 2018-12-30 DIAGNOSIS — F17210 Nicotine dependence, cigarettes, uncomplicated: Secondary | ICD-10-CM | POA: Insufficient documentation

## 2018-12-30 DIAGNOSIS — G893 Neoplasm related pain (acute) (chronic): Secondary | ICD-10-CM

## 2018-12-30 DIAGNOSIS — J449 Chronic obstructive pulmonary disease, unspecified: Secondary | ICD-10-CM | POA: Insufficient documentation

## 2018-12-30 DIAGNOSIS — G4730 Sleep apnea, unspecified: Secondary | ICD-10-CM | POA: Diagnosis not present

## 2018-12-30 DIAGNOSIS — R590 Localized enlarged lymph nodes: Secondary | ICD-10-CM | POA: Insufficient documentation

## 2018-12-30 DIAGNOSIS — I10 Essential (primary) hypertension: Secondary | ICD-10-CM

## 2018-12-30 LAB — CBC WITH DIFFERENTIAL (CANCER CENTER ONLY)
Abs Immature Granulocytes: 0.02 10*3/uL (ref 0.00–0.07)
Basophils Absolute: 0.1 10*3/uL (ref 0.0–0.1)
Basophils Relative: 1 %
Eosinophils Absolute: 0.1 10*3/uL (ref 0.0–0.5)
Eosinophils Relative: 1 %
HCT: 32 % — ABNORMAL LOW (ref 36.0–46.0)
Hemoglobin: 10.9 g/dL — ABNORMAL LOW (ref 12.0–15.0)
Immature Granulocytes: 0 %
Lymphocytes Relative: 17 %
Lymphs Abs: 1.1 10*3/uL (ref 0.7–4.0)
MCH: 31.5 pg (ref 26.0–34.0)
MCHC: 34.1 g/dL (ref 30.0–36.0)
MCV: 92.5 fL (ref 80.0–100.0)
Monocytes Absolute: 0.9 10*3/uL (ref 0.1–1.0)
Monocytes Relative: 14 %
Neutro Abs: 4.4 10*3/uL (ref 1.7–7.7)
Neutrophils Relative %: 67 %
Platelet Count: 494 10*3/uL — ABNORMAL HIGH (ref 150–400)
RBC: 3.46 MIL/uL — ABNORMAL LOW (ref 3.87–5.11)
RDW: 13.2 % (ref 11.5–15.5)
WBC Count: 6.7 10*3/uL (ref 4.0–10.5)
nRBC: 0 % (ref 0.0–0.2)

## 2018-12-30 LAB — CMP (CANCER CENTER ONLY)
ALT: 13 U/L (ref 0–44)
AST: 14 U/L — ABNORMAL LOW (ref 15–41)
Albumin: 2.9 g/dL — ABNORMAL LOW (ref 3.5–5.0)
Alkaline Phosphatase: 88 U/L (ref 38–126)
Anion gap: 11 (ref 5–15)
BUN: 19 mg/dL (ref 8–23)
CO2: 24 mmol/L (ref 22–32)
Calcium: 8.7 mg/dL — ABNORMAL LOW (ref 8.9–10.3)
Chloride: 97 mmol/L — ABNORMAL LOW (ref 98–111)
Creatinine: 0.8 mg/dL (ref 0.44–1.00)
GFR, Est AFR Am: >60 mL/min (ref >60)
GFR, Estimated: >60 mL/min (ref >60)
Glucose, Bld: 102 mg/dL — ABNORMAL HIGH (ref 70–99)
Potassium: 3.6 mmol/L (ref 3.5–5.1)
Sodium: 132 mmol/L — ABNORMAL LOW (ref 135–145)
Total Bilirubin: 0.5 mg/dL (ref 0.3–1.2)
Total Protein: 8.1 g/dL (ref 6.5–8.1)

## 2018-12-30 MED ORDER — SODIUM CHLORIDE 0.9 % IV SOLN
Freq: Once | INTRAVENOUS | Status: AC
  Administered 2018-12-30: 10:00:00 via INTRAVENOUS
  Filled 2018-12-30: qty 5

## 2018-12-30 MED ORDER — PALONOSETRON HCL INJECTION 0.25 MG/5ML
0.2500 mg | Freq: Once | INTRAVENOUS | Status: AC
  Administered 2018-12-30: 0.25 mg via INTRAVENOUS

## 2018-12-30 MED ORDER — SODIUM CHLORIDE 0.9 % IV SOLN
175.0000 mg/m2 | Freq: Once | INTRAVENOUS | Status: AC
  Administered 2018-12-30: 276 mg via INTRAVENOUS
  Filled 2018-12-30: qty 46

## 2018-12-30 MED ORDER — SODIUM CHLORIDE 0.9 % IV SOLN
Freq: Once | INTRAVENOUS | Status: AC
  Administered 2018-12-30: 10:00:00 via INTRAVENOUS
  Filled 2018-12-30: qty 250

## 2018-12-30 MED ORDER — DIPHENHYDRAMINE HCL 50 MG/ML IJ SOLN
INTRAMUSCULAR | Status: AC
  Filled 2018-12-30: qty 1

## 2018-12-30 MED ORDER — PALONOSETRON HCL INJECTION 0.25 MG/5ML
INTRAVENOUS | Status: AC
  Filled 2018-12-30: qty 5

## 2018-12-30 MED ORDER — HYDROCODONE-ACETAMINOPHEN 7.5-325 MG PO TABS: 1.0000 | ORAL_TABLET | Freq: Four times a day (QID) | ORAL | 0 refills | Status: DC | PRN

## 2018-12-30 MED ORDER — DIPHENHYDRAMINE HCL 50 MG/ML IJ SOLN
50.0000 mg | Freq: Once | INTRAMUSCULAR | Status: AC
  Administered 2018-12-30: 50 mg via INTRAVENOUS

## 2018-12-30 MED ORDER — SODIUM CHLORIDE 0.9 % IV SOLN
200.0000 mg | Freq: Once | INTRAVENOUS | Status: AC
  Administered 2018-12-30: 200 mg via INTRAVENOUS
  Filled 2018-12-30: qty 8

## 2018-12-30 MED ORDER — FAMOTIDINE IN NACL 20-0.9 MG/50ML-% IV SOLN
INTRAVENOUS | Status: AC
  Filled 2018-12-30: qty 50

## 2018-12-30 MED ORDER — SODIUM CHLORIDE 0.9 % IV SOLN
355.5000 mg | Freq: Once | INTRAVENOUS | Status: AC
  Administered 2018-12-30: 360 mg via INTRAVENOUS
  Filled 2018-12-30: qty 36

## 2018-12-30 MED ORDER — FAMOTIDINE IN NACL 20-0.9 MG/50ML-% IV SOLN
20.0000 mg | Freq: Once | INTRAVENOUS | Status: AC
  Administered 2018-12-30: 20 mg via INTRAVENOUS

## 2018-12-30 NOTE — Patient Instructions (Signed)
[place] Discharge Instructions for Patients Receiving Chemotherapy  Today you received the following chemotherapy agents: Keytruda, Taxol and Carboplatin.  To help prevent nausea and vomiting after your treatment, we encourage you to take your nausea medication as directed.   If you develop nausea and vomiting that is not controlled by your nausea medication, call the clinic.   BELOW ARE SYMPTOMS THAT SHOULD BE REPORTED IMMEDIATELY:  *FEVER GREATER THAN 100.5 F  *CHILLS WITH OR WITHOUT FEVER  NAUSEA AND VOMITING THAT IS NOT CONTROLLED WITH YOUR NAUSEA MEDICATION  *UNUSUAL SHORTNESS OF BREATH  *UNUSUAL BRUISING OR BLEEDING  TENDERNESS IN MOUTH AND THROAT WITH OR WITHOUT PRESENCE OF ULCERS  *URINARY PROBLEMS  *BOWEL PROBLEMS  UNUSUAL RASH Items with * indicate a potential emergency and should be followed up as soon as possible.  Feel free to call the clinic should you have any questions or concerns. The clinic phone number is [phone].  Please show the [facility] at check-in to the Emergency Department and triage nurse. Pembrolizumab injection What is this medicine? PEMBROLIZUMAB (pem broe liz ue mab) is a monoclonal antibody. It is used to treat bladder cancer, cervical cancer, endometrial cancer, esophageal cancer, head and neck cancer, hepatocellular cancer, Hodgkin lymphoma, kidney cancer, lymphoma, melanoma, Merkel cell carcinoma, lung cancer, stomach cancer, urothelial cancer, and cancers that have a certain genetic condition. This medicine may be used for other purposes; ask your health care provider or pharmacist if you have questions. COMMON BRAND NAME(S): Keytruda What should I tell my health care provider before I take this medicine? They need to know if you have any of these conditions:  diabetes  immune system problems  inflammatory bowel disease  liver disease  lung or breathing disease  lupus  received or scheduled to  receive an organ transplant or a stem-cell transplant that uses donor stem cells  an unusual or allergic reaction to pembrolizumab, other medicines, foods, dyes, or preservatives  pregnant or trying to get pregnant  breast-feeding How should I use this medicine? This medicine is for infusion into a vein. It is given by a health care professional in a hospital or clinic setting. A special MedGuide will be given to you before each treatment. Be sure to read this information carefully each time. Talk to your pediatrician regarding the use of this medicine in children. While this drug may be prescribed for selected conditions, precautions do apply. Overdosage: If you think you have taken too much of this medicine contact a poison control center or emergency room at once. NOTE: This medicine is only for you. Do not share this medicine with others. What if I miss a dose? It is important not to miss your dose. Call your doctor or health care professional if you are unable to keep an appointment. What may interact with this medicine? Interactions have not been studied. Give your health care provider a list of all the medicines, herbs, non-prescription drugs, or dietary supplements you use. Also tell them if you smoke, drink alcohol, or use illegal drugs. Some items may interact with your medicine. This list may not describe all possible interactions. Give your health care provider a list of all the medicines, herbs, non-prescription drugs, or dietary supplements you use. Also tell them if you smoke, drink alcohol, or use illegal drugs. Some items may interact with your medicine. What should I watch for while using this medicine? Your condition will be monitored carefully while you are receiving this medicine. You  may need blood work done while you are taking this medicine. Do not become pregnant while taking this medicine or for 4 months after stopping it. Women should inform their doctor if they wish  to become pregnant or think they might be pregnant. There is a potential for serious side effects to an unborn child. Talk to your health care professional or pharmacist for more information. Do not breast-feed an infant while taking this medicine or for 4 months after the last dose. What side effects may I notice from receiving this medicine? Side effects that you should report to your doctor or health care professional as soon as possible:  allergic reactions like skin rash, itching or hives, swelling of the face, lips, or tongue  bloody or black, tarry  breathing problems  changes in vision  chest pain  chills  confusion  constipation  cough  diarrhea  dizziness or feeling faint or lightheaded  fast or irregular heartbeat  fever  flushing  hair loss  joint pain  low blood counts - this medicine may decrease the number of white blood cells, red blood cells and platelets. You may be at increased risk for infections and bleeding.  muscle pain  muscle weakness  persistent headache  redness, blistering, peeling or loosening of the skin, including inside the mouth  signs and symptoms of high blood sugar such as dizziness; dry mouth; dry skin; fruity breath; nausea; stomach pain; increased hunger or thirst; increased urination  signs and symptoms of kidney injury like trouble passing urine or change in the amount of urine  signs and symptoms of liver injury like dark urine, light-colored stools, loss of appetite, nausea, right upper belly pain, yellowing of the eyes or skin  sweating  swollen lymph nodes  weight loss Side effects that usually do not require medical attention (report to your doctor or health care professional if they continue or are bothersome):  decreased appetite  muscle pain  tiredness This list may not describe all possible side effects. Call your doctor for medical advice about side effects. You may report side effects to FDA at  1-800-FDA-1088. Where should I keep my medicine? This drug is given in a hospital or clinic and will not be stored at home. NOTE: This sheet is a summary. It may not cover all possible information. If you have questions about this medicine, talk to your doctor, pharmacist, or health care provider.  2020 Elsevier/Gold Standard (2018-04-07 13:46:58) Paclitaxel injection What is this medicine? PACLITAXEL (PAK li TAX el) is a chemotherapy drug. It targets fast dividing cells, like cancer cells, and causes these cells to die. This medicine is used to treat ovarian cancer, breast cancer, lung cancer, Kaposi's sarcoma, and other cancers. This medicine may be used for other purposes; ask your health care provider or pharmacist if you have questions. COMMON BRAND NAME(S): Onxol, Taxol What should I tell my health care provider before I take this medicine? They need to know if you have any of these conditions:  history of irregular heartbeat  liver disease  low blood counts, like low white cell, platelet, or red cell counts  lung or breathing disease, like asthma  tingling of the fingers or toes, or other nerve disorder  an unusual or allergic reaction to paclitaxel, alcohol, polyoxyethylated castor oil, other chemotherapy, other medicines, foods, dyes, or preservatives  pregnant or trying to get pregnant  breast-feeding How should I use this medicine? This drug is given as an infusion into a vein. It  is administered in a hospital or clinic by a specially trained health care professional. Talk to your pediatrician regarding the use of this medicine in children. Special care may be needed. Overdosage: If you think you have taken too much of this medicine contact a poison control center or emergency room at once. NOTE: This medicine is only for you. Do not share this medicine with others. What if I miss a dose? It is important not to miss your dose. Call your doctor or health care professional  if you are unable to keep an appointment. What may interact with this medicine? Do not take this medicine with any of the following medications:  disulfiram  metronidazole This medicine may also interact with the following medications:  antiviral medicines for hepatitis, HIV or AIDS  certain antibiotics like erythromycin and clarithromycin  certain medicines for fungal infections like ketoconazole and itraconazole  certain medicines for seizures like carbamazepine, phenobarbital, phenytoin  gemfibrozil  nefazodone  rifampin  St. John's wort This list may not describe all possible interactions. Give your health care provider a list of all the medicines, herbs, non-prescription drugs, or dietary supplements you use. Also tell them if you smoke, drink alcohol, or use illegal drugs. Some items may interact with your medicine. What should I watch for while using this medicine? Your condition will be monitored carefully while you are receiving this medicine. You will need important blood work done while you are taking this medicine. This medicine can cause serious allergic reactions. To reduce your risk you will need to take other medicine(s) before treatment with this medicine. If you experience allergic reactions like skin rash, itching or hives, swelling of the face, lips, or tongue, tell your doctor or health care professional right away. In some cases, you may be given additional medicines to help with side effects. Follow all directions for their use. This drug may make you feel generally unwell. This is not uncommon, as chemotherapy can affect healthy cells as well as cancer cells. Report any side effects. Continue your course of treatment even though you feel ill unless your doctor tells you to stop. Call your doctor or health care professional for advice if you get a fever, chills or sore throat, or other symptoms of a cold or flu. Do not treat yourself. This drug decreases your  body's ability to fight infections. Try to avoid being around people who are sick. This medicine may increase your risk to bruise or bleed. Call your doctor or health care professional if you notice any unusual bleeding. Be careful brushing and flossing your teeth or using a toothpick because you may get an infection or bleed more easily. If you have any dental work done, tell your dentist you are receiving this medicine. Avoid taking products that contain aspirin, acetaminophen, ibuprofen, naproxen, or ketoprofen unless instructed by your doctor. These medicines may hide a fever. Do not become pregnant while taking this medicine. Women should inform their doctor if they wish to become pregnant or think they might be pregnant. There is a potential for serious side effects to an unborn child. Talk to your health care professional or pharmacist for more information. Do not breast-feed an infant while taking this medicine. Men are advised not to father a child while receiving this medicine. This product may contain alcohol. Ask your pharmacist or healthcare provider if this medicine contains alcohol. Be sure to tell all healthcare providers you are taking this medicine. Certain medicines, like metronidazole and disulfiram, can cause  an unpleasant reaction when taken with alcohol. The reaction includes flushing, headache, nausea, vomiting, sweating, and increased thirst. The reaction can last from 30 minutes to several hours. What side effects may I notice from receiving this medicine? Side effects that you should report to your doctor or health care professional as soon as possible:  allergic reactions like skin rash, itching or hives, swelling of the face, lips, or tongue  breathing problems  changes in vision  fast, irregular heartbeat  high or low blood pressure  mouth sores  pain, tingling, numbness in the hands or feet  signs of decreased platelets or bleeding - bruising, pinpoint red spots  on the skin, black, tarry stools, blood in the urine  signs of decreased red blood cells - unusually weak or tired, feeling faint or lightheaded, falls  signs of infection - fever or chills, cough, sore throat, pain or difficulty passing urine  signs and symptoms of liver injury like dark yellow or brown urine; general ill feeling or flu-like symptoms; light-colored stools; loss of appetite; nausea; right upper belly pain; unusually weak or tired; yellowing of the eyes or skin  swelling of the ankles, feet, hands  unusually slow heartbeat Side effects that usually do not require medical attention (report to your doctor or health care professional if they continue or are bothersome):  diarrhea  hair loss  loss of appetite  muscle or joint pain  nausea, vomiting  pain, redness, or irritation at site where injected  tiredness This list may not describe all possible side effects. Call your doctor for medical advice about side effects. You may report side effects to FDA at 1-800-FDA-1088. Where should I keep my medicine? This drug is given in a hospital or clinic and will not be stored at home. NOTE: This sheet is a summary. It may not cover all possible information. If you have questions about this medicine, talk to your doctor, pharmacist, or health care provider.  2020 Elsevier/Gold Standard (2016-11-12 13:14:55) Carboplatin injection What is this medicine? CARBOPLATIN (KAR boe pla tin) is a chemotherapy drug. It targets fast dividing cells, like cancer cells, and causes these cells to die. This medicine is used to treat ovarian cancer and many other cancers. This medicine may be used for other purposes; ask your health care provider or pharmacist if you have questions. COMMON BRAND NAME(S): Paraplatin What should I tell my health care provider before I take this medicine? They need to know if you have any of these conditions:  blood disorders  hearing problems  kidney  disease  recent or ongoing radiation therapy  an unusual or allergic reaction to carboplatin, cisplatin, other chemotherapy, other medicines, foods, dyes, or preservatives  pregnant or trying to get pregnant  breast-feeding How should I use this medicine? This drug is usually given as an infusion into a vein. It is administered in a hospital or clinic by a specially trained health care professional. Talk to your pediatrician regarding the use of this medicine in children. Special care may be needed. Overdosage: If you think you have taken too much of this medicine contact a poison control center or emergency room at once. NOTE: This medicine is only for you. Do not share this medicine with others. What if I miss a dose? It is important not to miss a dose. Call your doctor or health care professional if you are unable to keep an appointment. What may interact with this medicine?  medicines for seizures  medicines to increase blood  counts like filgrastim, pegfilgrastim, sargramostim  some antibiotics like amikacin, gentamicin, neomycin, streptomycin, tobramycin  vaccines Talk to your doctor or health care professional before taking any of these medicines:  acetaminophen  aspirin  ibuprofen  ketoprofen  naproxen This list may not describe all possible interactions. Give your health care provider a list of all the medicines, herbs, non-prescription drugs, or dietary supplements you use. Also tell them if you smoke, drink alcohol, or use illegal drugs. Some items may interact with your medicine. What should I watch for while using this medicine? Your condition will be monitored carefully while you are receiving this medicine. You will need important blood work done while you are taking this medicine. This drug may make you feel generally unwell. This is not uncommon, as chemotherapy can affect healthy cells as well as cancer cells. Report any side effects. Continue your course of  treatment even though you feel ill unless your doctor tells you to stop. In some cases, you may be given additional medicines to help with side effects. Follow all directions for their use. Call your doctor or health care professional for advice if you get a fever, chills or sore throat, or other symptoms of a cold or flu. Do not treat yourself. This drug decreases your body's ability to fight infections. Try to avoid being around people who are sick. This medicine may increase your risk to bruise or bleed. Call your doctor or health care professional if you notice any unusual bleeding. Be careful brushing and flossing your teeth or using a toothpick because you may get an infection or bleed more easily. If you have any dental work done, tell your dentist you are receiving this medicine. Avoid taking products that contain aspirin, acetaminophen, ibuprofen, naproxen, or ketoprofen unless instructed by your doctor. These medicines may hide a fever. Do not become pregnant while taking this medicine. Women should inform their doctor if they wish to become pregnant or think they might be pregnant. There is a potential for serious side effects to an unborn child. Talk to your health care professional or pharmacist for more information. Do not breast-feed an infant while taking this medicine. What side effects may I notice from receiving this medicine? Side effects that you should report to your doctor or health care professional as soon as possible:  allergic reactions like skin rash, itching or hives, swelling of the face, lips, or tongue  signs of infection - fever or chills, cough, sore throat, pain or difficulty passing urine  signs of decreased platelets or bleeding - bruising, pinpoint red spots on the skin, black, tarry stools, nosebleeds  signs of decreased red blood cells - unusually weak or tired, fainting spells, lightheadedness  breathing problems  changes in hearing  changes in  vision  chest pain  high blood pressure  low blood counts - This drug may decrease the number of white blood cells, red blood cells and platelets. You may be at increased risk for infections and bleeding.  nausea and vomiting  pain, swelling, redness or irritation at the injection site  pain, tingling, numbness in the hands or feet  problems with balance, talking, walking  trouble passing urine or change in the amount of urine Side effects that usually do not require medical attention (report to your doctor or health care professional if they continue or are bothersome):  hair loss  loss of appetite  metallic taste in the mouth or changes in taste This list may not describe all possible  side effects. Call your doctor for medical advice about side effects. You may report side effects to FDA at 1-800-FDA-1088. Where should I keep my medicine? This drug is given in a hospital or clinic and will not be stored at home. NOTE: This sheet is a summary. It may not cover all possible information. If you have questions about this medicine, talk to your doctor, pharmacist, or health care provider.  2020 Elsevier/Gold Standard (2007-06-16 14:38:05) Denosumab injection What is this medicine? DENOSUMAB (den oh sue mab) slows bone breakdown. Prolia is used to treat osteoporosis in women after menopause and in men, and in people who are taking corticosteroids for 6 months or more. Delton See is used to treat a high calcium level due to cancer and to prevent bone fractures and other bone problems caused by multiple myeloma or cancer bone metastases. Delton See is also used to treat giant cell tumor of the bone. This medicine may be used for other purposes; ask your health care provider or pharmacist if you have questions. COMMON BRAND NAME(S): Prolia, XGEVA What should I tell my health care provider before I take this medicine? They need to know if you have any of these conditions:  dental  disease  having surgery or tooth extraction  infection  kidney disease  low levels of calcium or Vitamin D in the blood  malnutrition  on hemodialysis  skin conditions or sensitivity  thyroid or parathyroid disease  an unusual reaction to denosumab, other medicines, foods, dyes, or preservatives  pregnant or trying to get pregnant  breast-feeding How should I use this medicine? This medicine is for injection under the skin. It is given by a health care professional in a hospital or clinic setting. A special MedGuide will be given to you before each treatment. Be sure to read this information carefully each time. For Prolia, talk to your pediatrician regarding the use of this medicine in children. Special care may be needed. For Delton See, talk to your pediatrician regarding the use of this medicine in children. While this drug may be prescribed for children as young as 13 years for selected conditions, precautions do apply. Overdosage: If you think you have taken too much of this medicine contact a poison control center or emergency room at once. NOTE: This medicine is only for you. Do not share this medicine with others. What if I miss a dose? It is important not to miss your dose. Call your doctor or health care professional if you are unable to keep an appointment. What may interact with this medicine? Do not take this medicine with any of the following medications:  other medicines containing denosumab This medicine may also interact with the following medications:  medicines that lower your chance of fighting infection  steroid medicines like prednisone or cortisone This list may not describe all possible interactions. Give your health care provider a list of all the medicines, herbs, non-prescription drugs, or dietary supplements you use. Also tell them if you smoke, drink alcohol, or use illegal drugs. Some items may interact with your medicine. What should I watch for  while using this medicine? Visit your doctor or health care professional for regular checks on your progress. Your doctor or health care professional may order blood tests and other tests to see how you are doing. Call your doctor or health care professional for advice if you get a fever, chills or sore throat, or other symptoms of a cold or flu. Do not treat yourself. This drug may  decrease your body's ability to fight infection. Try to avoid being around people who are sick. You should make sure you get enough calcium and vitamin D while you are taking this medicine, unless your doctor tells you not to. Discuss the foods you eat and the vitamins you take with your health care professional. See your dentist regularly. Brush and floss your teeth as directed. Before you have any dental work done, tell your dentist you are receiving this medicine. Do not become pregnant while taking this medicine or for 5 months after stopping it. Talk with your doctor or health care professional about your birth control options while taking this medicine. Women should inform their doctor if they wish to become pregnant or think they might be pregnant. There is a potential for serious side effects to an unborn child. Talk to your health care professional or pharmacist for more information. What side effects may I notice from receiving this medicine? Side effects that you should report to your doctor or health care professional as soon as possible:  allergic reactions like skin rash, itching or hives, swelling of the face, lips, or tongue  bone pain  breathing problems  dizziness  jaw pain, especially after dental work  redness, blistering, peeling of the skin  signs and symptoms of infection like fever or chills; cough; sore throat; pain or trouble passing urine  signs of low calcium like fast heartbeat, muscle cramps or muscle pain; pain, tingling, numbness in the hands or feet; seizures  unusual bleeding or  bruising  unusually weak or tired Side effects that usually do not require medical attention (report to your doctor or health care professional if they continue or are bothersome):  constipation  diarrhea  headache  joint pain  loss of appetite  muscle pain  runny nose  tiredness  upset stomach This list may not describe all possible side effects. Call your doctor for medical advice about side effects. You may report side effects to FDA at 1-800-FDA-1088. Where should I keep my medicine? This medicine is only given in a clinic, doctor's office, or other health care setting and will not be stored at home. NOTE: This sheet is a summary. It may not cover all possible information. If you have questions about this medicine, talk to your doctor, pharmacist, or health care provider.  2020 Elsevier/Gold Standard (2017-07-18 16:10:44)

## 2018-12-30 NOTE — Progress Notes (Signed)
Nutrition follow-up completed with patient during infusion for non-small cell lung cancer. Weight is stable at 124.9 pounds today.  Patient weighed 124.7 pounds September 9. Patient reports she knows her bowel movements will improve when she starts to eat adequate calories and protein.  She plans on doing this "soon". Patient continues to be somewhat drowsy from Benadryl and is slow to answer questions. She declined nutrition supplement samples.  Nutrition diagnosis: Unintended weight loss is stable.  Intervention: Patient was educated to continue Ensure Enlive twice daily between meals. Recommended patient continue soft, high-calorie, high-protein foods. Provided additional coupons. Encourage patient to contact me for questions.  Monitoring, evaluation, goals: Patient will tolerate adequate calories and protein to minimize weight loss.  Next visit: Wednesday, November 18 during infusion.  **Disclaimer: This note was dictated with voice recognition software. Similar sounding words can inadvertently be transcribed and this note may contain transcription errors which may not have been corrected upon publication of note.**

## 2018-12-30 NOTE — Progress Notes (Signed)
[facility] OFFICE PROGRESS NOTE  [name], MD [place] [id number]  DIAGNOSIS: Highly suspicious metastatic lung cancer presented with right upper lobe lung mass in addition to pleural-based metastasis andmediastinal lymphadenopathy.  PRIOR THERAPY: None  CURRENT THERAPY: Chemotherapy with carboplatin for an AUC of 5,paclitaxel 175 mg/m, and Keytruda 200 mg IV every 3 weeks with Neulasta support. First treatment on 12/09/2018. Status post 1 cycle.  INTERVAL HISTORY: Debra Rodgers 73 y.o. female returns to the clinic for a follow up visit. The patient is feeling fair today without any concerning complaints except for continued right lower rib pain which radiates to her back. She takes Norco for the pain every hours as needed. She also recently saw her pulmonologist for her shortness of breath/COPD in which she was prescribed a new inhaler in addition to her albuterol which she takes as needed. She reports her usual shortness of breath but denies hemoptysis or cough.  Regarding her chemotherapy with carboplatin, paclitaxel, and Keytruda, she is tolerating this fairly well except for fatigue. She denies any fever, chills, or night sweats. She is followed closely the nutritionist team at the cancer center for her prior weight loss.  Denies any signs and symptoms of infection including sore throat, nasal congestion, skin infections, or dysuria. She denies any headache or visual changes.  She denies any rashes or skin changes. She denies any nausea, vomiting, or diarrhea. She occasionally has constipation but it is a little better with the addition of over the counter medications. Her last bowel movement was last night. She is here today for evaluation before starting cycle #2.   MEDICAL HISTORY: Past Medical History:  Diagnosis Date  . COPD (chronic obstructive pulmonary disease) ([place])   . Depression   . Essential hypertension   . Headache    . History of migraine headaches   . Sleep apnea   . Tobacco use disorder     ALLERGIES:  is allergic to codeine sulfate and pantoprazole sodium.  MEDICATIONS:  Current Outpatient Medications  Medication Sig Dispense Refill  . acetaminophen (TYLENOL) 325 MG tablet Take 2 tablets (650 mg total) by mouth every 6 (six) hours as needed. 30 tablet 0  . amLODipine (NORVASC) 10 MG tablet Take 1 tablet (10 mg total) by mouth daily. 90 tablet 1  . aspirin 81 MG EC tablet Take 81 mg by mouth daily.      Marland Kitchen atorvastatin (LIPITOR) 20 MG tablet TAKE 1 TABLET BY MOUTH EVERY DAY 90 tablet 3  . chlorthalidone (HYGROTON) 50 MG tablet TAKE 1 TABLET BY MOUTH EVERY DAY 90 tablet 1  . famotidine (PEPCID) 20 MG tablet Take 20 mg by mouth daily.     Marland Kitchen FLUoxetine (PROZAC) 20 MG capsule TAKE 1 CAPSULE (20 MG TOTAL) DAILY BY MOUTH. 30 capsule 5  . gabapentin (NEURONTIN) 100 MG capsule Take 1 capsule (100 mg total) by mouth 2 (two) times daily. 180 capsule 3  . HYDROcodone-acetaminophen (NORCO) 7.5-325 MG tablet Take 1 tablet by mouth every 6 (six) hours as needed for moderate pain. 60 tablet 0  . lisinopril (ZESTRIL) 10 MG tablet TAKE 1 TABLET BY MOUTH EVERY DAY 90 tablet 1  . nicotine ([employer] NICOTINE TRANSDERMAL SYS) 14 mg/24hr patch Place 1 patch (14 mg total) onto the skin daily. 42 patch 0  . prochlorperazine (COMPAZINE) 10 MG tablet Take 1 tablet (10 mg total) by mouth every 6 (six) hours as needed for nausea or vomiting. Ruth  tablet 2  . Tiotropium Bromide-Olodaterol (STIOLTO RESPIMAT) 2.5-2.5 MCG/ACT AERS Inhale 2 puffs into the lungs daily. 8 g 0  . VENTOLIN HFA 108 (90 Base) MCG/ACT inhaler INHALE 1-2 PUFFS INTO THE LUNGS EVERY 6 (SIX) HOURS AS NEEDED FOR WHEEZING OR SHORTNESS OF BREATH. 18 Inhaler 0   No current facility-administered medications for this visit.    Facility-Administered Medications Ordered in Other Visits  Medication Dose Route Frequency Provider Last Rate Last Dose  . CARBOplatin  (PARAPLATIN) 360 mg in sodium chloride 0.9 % 250 mL chemo infusion  360 mg Intravenous Once Curt Bears, MD      . PACLitaxel (TAXOL) 276 mg in sodium chloride 0.9 % 250 mL chemo infusion (> 19m/m2)  175 mg/m2 (Treatment Plan Recorded) Intravenous Once MCurt Bears MD      . pembrolizumab ([facility] 200 mg in sodium chloride 0.9 % 50 mL chemo infusion  200 mg Intravenous Once MCurt Bears MD        SURGICAL HISTORY:  Past Surgical History:  Procedure Laterality Date  . ABDOMINAL HYSTERECTOMY    . CHOLECYSTECTOMY    . ROTATOR CUFF REPAIR  3/04  . VIDEO BRONCHOSCOPY WITH ENDOBRONCHIAL ULTRASOUND Right 11/25/2018   Procedure: VIDEO BRONCHOSCOPY WITH ENDOBRONCHIAL ULTRASOUND;  Surgeon: BCollene Gobble MD;  [facility]  Service: Thoracic;  Laterality: Right;    REVIEW OF SYSTEMS:   Review of Systems  Constitutional: Positive for fatigue. Negative for appetite change, chills, fever and unexpected weight change.  HENT: Negative for mouth sores, nosebleeds, sore throat and trouble swallowing.   Eyes: Negative for eye problems and icterus.  Respiratory: Positive for baseline shortness of breath. Negative for cough, hemoptysis, and wheezing.   Cardiovascular: Positive for right anterior chest pain/RUQ. Negative for leg swelling.  Gastrointestinal: Positive for abdominal discomfort RUQ, constipation, and gas pain. Negative for diarrhea, nausea and vomiting.  Genitourinary: Negative for bladder incontinence, difficulty urinating, dysuria, frequency and hematuria.   Musculoskeletal: Negative for back pain, gait problem, neck pain and neck stiffness.  Skin: Negative for itching and rash.  Neurological: Negative for dizziness, extremity weakness, gait problem, headaches, light-headedness and seizures.  Hematological: Negative for adenopathy. Does not bruise/bleed easily.  Psychiatric/Behavioral: Negative for confusion, depression and sleep disturbance. The patient is not  nervous/anxious.   PHYSICAL EXAMINATION:  Blood pressure 114/77, pulse 93, temperature 98.7 F (37.1 C), temperature source Temporal, resp. rate 18, height 5' 1"  (1.549 m), weight 124 lb 14.4 oz (56.7 kg), SpO2 99 %.  ECOG PERFORMANCE STATUS: 1 - Symptomatic but completely ambulatory  Physical Exam  Constitutional: Oriented to person, place, and time and thin appearing female and in no distress.  HENT:  Head: Normocephalic and atraumatic.  Mouth/Throat: Oropharynx is clear and moist. No oropharyngeal exudate.  Eyes: Conjunctivae are normal. Right eye exhibits no discharge. Left eye exhibits no discharge. No scleral icterus.  Neck: Normal range of motion. Neck supple.  Cardiovascular: Normal rate, regular rhythm, normal heart sounds and intact distal pulses.   Pulmonary/Chest: Effort normal and breath sounds normal. No respiratory distress. No wheezes. No rales.  Abdominal: Soft. Mild tenderness to palpation in the RUQ/RLQ. Exhibits no distension and no mass.  Musculoskeletal: Normal range of motion. Exhibits no edema.  Lymphadenopathy:    No cervical adenopathy.  Neurological: Alert and oriented to person, place, and time. Exhibits normal muscle tone. Gait normal. Coordination normal.  Skin: Skin is warm and dry. No rash noted. Not diaphoretic. No erythema. No pallor.  Psychiatric: Mood, memory and  judgment normal.  Vitals reviewed.  LABORATORY DATA: Lab Results  Component Value Date   WBC 6.7 12/30/2018   HGB 10.9 (L) 12/30/2018   HCT 32.0 (L) 12/30/2018   MCV 92.5 12/30/2018   PLT 494 (H) 12/30/2018      Chemistry      Component Value Date/Time   NA 132 (L) 12/30/2018 0834   NA 139 07/21/2017 0958   K 3.6 12/30/2018 0834   CL 97 (L) 12/30/2018 0834   CO2 24 12/30/2018 0834   BUN 19 12/30/2018 0834   BUN 26 07/21/2017 0958   CREATININE 0.80 12/30/2018 0834   CREATININE 0.79 09/03/2013 1540      Component Value Date/Time   CALCIUM 8.7 (L) 12/30/2018 0834   ALKPHOS 88  12/30/2018 0834   AST 14 (L) 12/30/2018 0834   ALT 13 12/30/2018 0834   BILITOT 0.5 12/30/2018 0834       RADIOGRAPHIC STUDIES:  Dg Chest 2 View  Result Date: 12/17/2018 CLINICAL DATA:  Lung cancer.  Generalized chest pain. EXAM: CHEST - 2 VIEW COMPARISON:  November 04, 2018 FINDINGS: Again noted are multiple right lower lobe lung masses, somewhat similar to prior study. There are areas of increased attenuation overlying the right lower lung zone when compared to prior study. There is no pneumothorax. No large pleural effusion. There is stable elevation left hemidiaphragm. Aortic calcifications are noted. IMPRESSION: 1. Again noted are multiple lung masses overlying the right lower lung zone. 2. Increasing attenuation throughout the right lower lung zone may represent progression of disease or a developing infiltrate. Electronically Signed   By: Constance Holster M.D.   On: 12/17/2018 16:17     ASSESSMENT/PLAN:  This is a very pleasant 73 year old African-American female diagnosed with stage IV non-small cell lung cancer, squamous cell carcinoma. She presented with a right upper lobe lung mass and pleural based metastases and mediastinal lymphadenopathy.She was diagnosed in August 2020   The patient is currently undergoing systemicchemotherapy with carboplatin for an AUC of 5, paclitaxel 175 mg/m, and Keytruda 200 mg IV every 3 weeks with Neulasta support. She is status post her first cycle of treatment and she tolerated it well without any concerning adverse effects except for mild fatigue.   The patient was seen with Dr. Julien Nordmann. Labs were reviewed. Dr. Julien Nordmann recommends that the patient proceed with cycle #2 today as scheduled.   We will see her back for a follow up visit in 3 weeks for evaluation before starting cycle #3.  She will continue on her current pain regimen with Norco every 6 hours as needed for cancer related pain. I have sent a refill to her pharmacy.   She is  scheduled to follow up with the nutritionist team here at the cancer center while in infusion today regarding her weight loss.   The patient was advised to call immediately if she has any concerning symptoms in the interval. The patient voices understanding of current disease status and treatment options and is in agreement with the current care plan. All questions were answered. The patient knows to call the clinic with any problems, questions or concerns. We can certainly see the patient much sooner if necessary  No orders of the defined types were placed in this encounter.    Mindie Rawdon L Adrick Kestler, PA-C 12/30/18  ADDENDUM: Hematology/Oncology Attending: I had a face-to-face encounter with the patient today.  I recommended her care plan.  This is a very pleasant 73 years old African-American female with a  stage IV non-small cell lung cancer, squamous cell carcinoma presented with right upper lobe lung mass in addition to pleural-based metastasis and mediastinal lymphadenopathy diagnosed in August 2020. She is currently undergoing systemic chemotherapy with carboplatin, paclitaxel and Keytruda status post 1 cycle.  She tolerated the first cycle of her treatment well except for the pain from the Neulasta injection as well as the baseline right-sided chest pain. I recommended for the patient to proceed with cycle #2 today as planned. We will see her back for follow-up visit in 3 weeks for evaluation before the next cycle of her treatment. For the right-sided chest pain, will give the patient a refill of Norco today. She was advised to call immediately if she has any concerning symptoms in the interval. Disclaimer: This note was dictated with voice recognition software. Similar sounding words can inadvertently be transcribed and may be missed upon review.  Eilleen Kempf, MD 12/30/18

## 2019-01-01 ENCOUNTER — Other Ambulatory Visit: Payer: Self-pay

## 2019-01-01 ENCOUNTER — Inpatient Hospital Stay: Payer: 59

## 2019-01-01 VITALS — BP 124/86 | HR 77 | Temp 99.7°F | Resp 18

## 2019-01-01 DIAGNOSIS — C3411 Malignant neoplasm of upper lobe, right bronchus or lung: Secondary | ICD-10-CM | POA: Diagnosis not present

## 2019-01-01 DIAGNOSIS — C3491 Malignant neoplasm of unspecified part of right bronchus or lung: Secondary | ICD-10-CM

## 2019-01-01 MED ORDER — PEGFILGRASTIM INJECTION 6 MG/0.6ML ~~LOC~~
PREFILLED_SYRINGE | SUBCUTANEOUS | Status: AC
  Filled 2019-01-01: qty 0.6

## 2019-01-01 MED ORDER — PEGFILGRASTIM INJECTION 6 MG/0.6ML ~~LOC~~
6.0000 mg | PREFILLED_SYRINGE | Freq: Once | SUBCUTANEOUS | Status: AC
  Administered 2019-01-01: 11:00:00 6 mg via SUBCUTANEOUS

## 2019-01-06 ENCOUNTER — Inpatient Hospital Stay: Payer: 59

## 2019-01-06 ENCOUNTER — Other Ambulatory Visit: Payer: Self-pay

## 2019-01-06 DIAGNOSIS — C3411 Malignant neoplasm of upper lobe, right bronchus or lung: Secondary | ICD-10-CM | POA: Diagnosis not present

## 2019-01-06 DIAGNOSIS — C3491 Malignant neoplasm of unspecified part of right bronchus or lung: Secondary | ICD-10-CM

## 2019-01-06 LAB — CMP (CANCER CENTER ONLY)
ALT: 48 U/L — ABNORMAL HIGH (ref 0–44)
AST: 29 U/L (ref 15–41)
Albumin: 3.3 g/dL — ABNORMAL LOW (ref 3.5–5.0)
Alkaline Phosphatase: 141 U/L — ABNORMAL HIGH (ref 38–126)
Anion gap: 12 (ref 5–15)
BUN: 21 mg/dL (ref 8–23)
CO2: 27 mmol/L (ref 22–32)
Calcium: 8.9 mg/dL (ref 8.9–10.3)
Chloride: 101 mmol/L (ref 98–111)
Creatinine: 0.8 mg/dL (ref 0.44–1.00)
GFR, Est AFR Am: >60 mL/min (ref >60)
GFR, Estimated: >60 mL/min (ref >60)
Glucose, Bld: 88 mg/dL (ref 70–99)
Potassium: 3.7 mmol/L (ref 3.5–5.1)
Sodium: 140 mmol/L (ref 135–145)
Total Bilirubin: 0.3 mg/dL (ref 0.3–1.2)
Total Protein: 7.8 g/dL (ref 6.5–8.1)

## 2019-01-06 LAB — CBC WITH DIFFERENTIAL (CANCER CENTER ONLY)
Abs Immature Granulocytes: 0.14 10*3/uL — ABNORMAL HIGH (ref 0.00–0.07)
Basophils Absolute: 0.1 10*3/uL (ref 0.0–0.1)
Basophils Relative: 1 %
Eosinophils Absolute: 0.2 10*3/uL (ref 0.0–0.5)
Eosinophils Relative: 1 %
HCT: 30.9 % — ABNORMAL LOW (ref 36.0–46.0)
Hemoglobin: 10.2 g/dL — ABNORMAL LOW (ref 12.0–15.0)
Immature Granulocytes: 1 %
Lymphocytes Relative: 14 %
Lymphs Abs: 1.9 10*3/uL (ref 0.7–4.0)
MCH: 31.5 pg (ref 26.0–34.0)
MCHC: 33 g/dL (ref 30.0–36.0)
MCV: 95.4 fL (ref 80.0–100.0)
Monocytes Absolute: 1.3 10*3/uL — ABNORMAL HIGH (ref 0.1–1.0)
Monocytes Relative: 9 %
Neutro Abs: 10.3 10*3/uL — ABNORMAL HIGH (ref 1.7–7.7)
Neutrophils Relative %: 74 %
Platelet Count: 440 10*3/uL — ABNORMAL HIGH (ref 150–400)
RBC: 3.24 MIL/uL — ABNORMAL LOW (ref 3.87–5.11)
RDW: 13.5 % (ref 11.5–15.5)
WBC Count: 14 10*3/uL — ABNORMAL HIGH (ref 4.0–10.5)
nRBC: 0 % (ref 0.0–0.2)

## 2019-01-06 LAB — TSH: TSH: 2.088 u[IU]/mL (ref 0.308–3.960)

## 2019-01-13 ENCOUNTER — Inpatient Hospital Stay: Payer: 59

## 2019-01-13 ENCOUNTER — Other Ambulatory Visit: Payer: Self-pay

## 2019-01-13 DIAGNOSIS — C3491 Malignant neoplasm of unspecified part of right bronchus or lung: Secondary | ICD-10-CM

## 2019-01-13 DIAGNOSIS — C3411 Malignant neoplasm of upper lobe, right bronchus or lung: Secondary | ICD-10-CM | POA: Diagnosis not present

## 2019-01-13 LAB — CBC WITH DIFFERENTIAL (CANCER CENTER ONLY)
Abs Immature Granulocytes: 0.05 10*3/uL (ref 0.00–0.07)
Basophils Absolute: 0.1 10*3/uL (ref 0.0–0.1)
Basophils Relative: 1 %
Eosinophils Absolute: 0.1 10*3/uL (ref 0.0–0.5)
Eosinophils Relative: 1 %
HCT: 32.4 % — ABNORMAL LOW (ref 36.0–46.0)
Hemoglobin: 10.7 g/dL — ABNORMAL LOW (ref 12.0–15.0)
Immature Granulocytes: 1 %
Lymphocytes Relative: 18 %
Lymphs Abs: 1.9 10*3/uL (ref 0.7–4.0)
MCH: 31.7 pg (ref 26.0–34.0)
MCHC: 33 g/dL (ref 30.0–36.0)
MCV: 95.9 fL (ref 80.0–100.0)
Monocytes Absolute: 0.7 10*3/uL (ref 0.1–1.0)
Monocytes Relative: 7 %
Neutro Abs: 7.9 10*3/uL — ABNORMAL HIGH (ref 1.7–7.7)
Neutrophils Relative %: 72 %
Platelet Count: 310 10*3/uL (ref 150–400)
RBC: 3.38 MIL/uL — ABNORMAL LOW (ref 3.87–5.11)
RDW: 14.8 % (ref 11.5–15.5)
WBC Count: 10.7 10*3/uL — ABNORMAL HIGH (ref 4.0–10.5)
nRBC: 0 % (ref 0.0–0.2)

## 2019-01-13 LAB — CMP (CANCER CENTER ONLY)
ALT: 17 U/L (ref 0–44)
AST: 11 U/L — ABNORMAL LOW (ref 15–41)
Albumin: 3.3 g/dL — ABNORMAL LOW (ref 3.5–5.0)
Alkaline Phosphatase: 117 U/L (ref 38–126)
Anion gap: 11 (ref 5–15)
BUN: 15 mg/dL (ref 8–23)
CO2: 25 mmol/L (ref 22–32)
Calcium: 9.3 mg/dL (ref 8.9–10.3)
Chloride: 103 mmol/L (ref 98–111)
Creatinine: 0.83 mg/dL (ref 0.44–1.00)
GFR, Est AFR Am: >60 mL/min (ref >60)
GFR, Estimated: >60 mL/min (ref >60)
Glucose, Bld: 109 mg/dL — ABNORMAL HIGH (ref 70–99)
Potassium: 3.9 mmol/L (ref 3.5–5.1)
Sodium: 139 mmol/L (ref 135–145)
Total Bilirubin: 0.3 mg/dL (ref 0.3–1.2)
Total Protein: 7.7 g/dL (ref 6.5–8.1)

## 2019-01-20 ENCOUNTER — Other Ambulatory Visit: Payer: Self-pay

## 2019-01-20 ENCOUNTER — Inpatient Hospital Stay (HOSPITAL_BASED_OUTPATIENT_CLINIC_OR_DEPARTMENT_OTHER): Payer: 59 | Admitting: Medical

## 2019-01-20 ENCOUNTER — Inpatient Hospital Stay: Payer: 59

## 2019-01-20 ENCOUNTER — Other Ambulatory Visit: Payer: Self-pay | Admitting: Internal Medicine

## 2019-01-20 VITALS — BP 119/77 | HR 80 | Temp 97.3°F | Resp 18 | Wt 128.5 lb

## 2019-01-20 DIAGNOSIS — C3491 Malignant neoplasm of unspecified part of right bronchus or lung: Secondary | ICD-10-CM

## 2019-01-20 DIAGNOSIS — C3411 Malignant neoplasm of upper lobe, right bronchus or lung: Secondary | ICD-10-CM | POA: Diagnosis not present

## 2019-01-20 LAB — CBC WITH DIFFERENTIAL (CANCER CENTER ONLY)
Abs Immature Granulocytes: 0.02 10*3/uL (ref 0.00–0.07)
Basophils Absolute: 0.1 10*3/uL (ref 0.0–0.1)
Basophils Relative: 1 %
Eosinophils Absolute: 0.1 10*3/uL (ref 0.0–0.5)
Eosinophils Relative: 2 %
HCT: 32.2 % — ABNORMAL LOW (ref 36.0–46.0)
Hemoglobin: 10.7 g/dL — ABNORMAL LOW (ref 12.0–15.0)
Immature Granulocytes: 0 %
Lymphocytes Relative: 32 %
Lymphs Abs: 1.7 10*3/uL (ref 0.7–4.0)
MCH: 31.6 pg (ref 26.0–34.0)
MCHC: 33.2 g/dL (ref 30.0–36.0)
MCV: 95 fL (ref 80.0–100.0)
Monocytes Absolute: 0.7 10*3/uL (ref 0.1–1.0)
Monocytes Relative: 12 %
Neutro Abs: 2.9 10*3/uL (ref 1.7–7.7)
Neutrophils Relative %: 53 %
Platelet Count: 309 10*3/uL (ref 150–400)
RBC: 3.39 MIL/uL — ABNORMAL LOW (ref 3.87–5.11)
RDW: 15.3 % (ref 11.5–15.5)
WBC Count: 5.4 10*3/uL (ref 4.0–10.5)
nRBC: 0 % (ref 0.0–0.2)

## 2019-01-20 LAB — CMP (CANCER CENTER ONLY)
ALT: 10 U/L (ref 0–44)
AST: 10 U/L — ABNORMAL LOW (ref 15–41)
Albumin: 3.4 g/dL — ABNORMAL LOW (ref 3.5–5.0)
Alkaline Phosphatase: 91 U/L (ref 38–126)
Anion gap: 13 (ref 5–15)
BUN: 36 mg/dL — ABNORMAL HIGH (ref 8–23)
CO2: 25 mmol/L (ref 22–32)
Calcium: 9.2 mg/dL (ref 8.9–10.3)
Chloride: 102 mmol/L (ref 98–111)
Creatinine: 0.9 mg/dL (ref 0.44–1.00)
GFR, Est AFR Am: >60 mL/min (ref >60)
GFR, Estimated: >60 mL/min (ref >60)
Glucose, Bld: 91 mg/dL (ref 70–99)
Potassium: 3.7 mmol/L (ref 3.5–5.1)
Sodium: 140 mmol/L (ref 135–145)
Total Bilirubin: 0.6 mg/dL (ref 0.3–1.2)
Total Protein: 7.7 g/dL (ref 6.5–8.1)

## 2019-01-20 MED ORDER — SODIUM CHLORIDE 0.9 % IV SOLN
175.0000 mg/m2 | Freq: Once | INTRAVENOUS | Status: AC
  Administered 2019-01-20: 276 mg via INTRAVENOUS
  Filled 2019-01-20: qty 46

## 2019-01-20 MED ORDER — FLUOXETINE HCL 20 MG PO CAPS: 20.0000 mg | ORAL_CAPSULE | Freq: Every day | ORAL | 5 refills | Status: DC

## 2019-01-20 MED ORDER — SODIUM CHLORIDE 0.9 % IV SOLN
Freq: Once | INTRAVENOUS | Status: AC
  Administered 2019-01-20: 09:00:00 via INTRAVENOUS
  Filled 2019-01-20: qty 250

## 2019-01-20 MED ORDER — SODIUM CHLORIDE 0.9 % IV SOLN
200.0000 mg | Freq: Once | INTRAVENOUS | Status: AC
  Administered 2019-01-20: 200 mg via INTRAVENOUS
  Filled 2019-01-20: qty 8

## 2019-01-20 MED ORDER — DIPHENHYDRAMINE HCL 50 MG/ML IJ SOLN
INTRAMUSCULAR | Status: AC
  Filled 2019-01-20: qty 1

## 2019-01-20 MED ORDER — PALONOSETRON HCL INJECTION 0.25 MG/5ML
0.2500 mg | Freq: Once | INTRAVENOUS | Status: AC
  Administered 2019-01-20: 0.25 mg via INTRAVENOUS

## 2019-01-20 MED ORDER — SODIUM CHLORIDE 0.9 % IV SOLN
Freq: Once | INTRAVENOUS | Status: AC
  Administered 2019-01-20: 10:00:00 via INTRAVENOUS
  Filled 2019-01-20: qty 5

## 2019-01-20 MED ORDER — SODIUM CHLORIDE 0.9 % IV SOLN
360.0000 mg | Freq: Once | INTRAVENOUS | Status: AC
  Administered 2019-01-20: 360 mg via INTRAVENOUS
  Filled 2019-01-20: qty 36

## 2019-01-20 MED ORDER — FAMOTIDINE IN NACL 20-0.9 MG/50ML-% IV SOLN
INTRAVENOUS | Status: AC
  Filled 2019-01-20: qty 50

## 2019-01-20 MED ORDER — DIPHENHYDRAMINE HCL 50 MG/ML IJ SOLN
25.0000 mg | Freq: Once | INTRAMUSCULAR | Status: AC
  Administered 2019-01-20: 25 mg via INTRAVENOUS

## 2019-01-20 MED ORDER — PALONOSETRON HCL INJECTION 0.25 MG/5ML
INTRAVENOUS | Status: AC
  Filled 2019-01-20: qty 5

## 2019-01-20 MED ORDER — FAMOTIDINE IN NACL 20-0.9 MG/50ML-% IV SOLN
20.0000 mg | Freq: Once | INTRAVENOUS | Status: AC
  Administered 2019-01-20: 20 mg via INTRAVENOUS

## 2019-01-20 NOTE — Progress Notes (Signed)
At approximately 1515, Pt got up to BR, was almost at the end of her taxol infusion, tubing was attached to her IV with a phaseal.  The injector on the phaseal came loose while she was in the bathroom, pt had blood & ? Taxol leaking.  Taxol bag was empty upon pt's return to her chair, IV remains intact with saline infusing.  Shelia Media PA examined pt's skin & hand, RN instructed to continue with carbo infusion.

## 2019-01-20 NOTE — Patient Instructions (Addendum)
Pembrolizumab injection What is this medicine? PEMBROLIZUMAB (pem broe liz ue mab) is a monoclonal antibody. It is used to treat bladder cancer, cervical cancer, endometrial cancer, esophageal cancer, head and neck cancer, hepatocellular cancer, Hodgkin lymphoma, kidney cancer, lymphoma, melanoma, Merkel cell carcinoma, lung cancer, stomach cancer, urothelial cancer, and cancers that have a certain genetic condition. This medicine may be used for other purposes; ask your health care provider or pharmacist if you have questions. COMMON BRAND NAME(S): Keytruda What should I tell my health care provider before I take this medicine? They need to know if you have any of these conditions:  diabetes  immune system problems  inflammatory bowel disease  liver disease  lung or breathing disease  lupus  received or scheduled to receive an organ transplant or a stem-cell transplant that uses donor stem cells  an unusual or allergic reaction to pembrolizumab, other medicines, foods, dyes, or preservatives  pregnant or trying to get pregnant  breast-feeding How should I use this medicine? This medicine is for infusion into a vein. It is given by a health care professional in a hospital or clinic setting. A special MedGuide will be given to you before each treatment. Be sure to read this information carefully each time. Talk to your pediatrician regarding the use of this medicine in children. While this drug may be prescribed for selected conditions, precautions do apply. Overdosage: If you think you have taken too much of this medicine contact a poison control center or emergency room at once. NOTE: This medicine is only for you. Do not share this medicine with others. What if I miss a dose? It is important not to miss your dose. Call your doctor or health care professional if you are unable to keep an appointment. What may interact with this medicine? Interactions have not been studied. Give  your health care provider a list of all the medicines, herbs, non-prescription drugs, or dietary supplements you use. Also tell them if you smoke, drink alcohol, or use illegal drugs. Some items may interact with your medicine. This list may not describe all possible interactions. Give your health care provider a list of all the medicines, herbs, non-prescription drugs, or dietary supplements you use. Also tell them if you smoke, drink alcohol, or use illegal drugs. Some items may interact with your medicine. What should I watch for while using this medicine? Your condition will be monitored carefully while you are receiving this medicine. You may need blood work done while you are taking this medicine. Do not become pregnant while taking this medicine or for 4 months after stopping it. Women should inform their doctor if they wish to become pregnant or think they might be pregnant. There is a potential for serious side effects to an unborn child. Talk to your health care professional or pharmacist for more information. Do not breast-feed an infant while taking this medicine or for 4 months after the last dose. What side effects may I notice from receiving this medicine? Side effects that you should report to your doctor or health care professional as soon as possible:  allergic reactions like skin rash, itching or hives, swelling of the face, lips, or tongue  bloody or black, tarry  breathing problems  changes in vision  chest pain  chills  confusion  constipation  cough  diarrhea  dizziness or feeling faint or lightheaded  fast or irregular heartbeat  fever  flushing  hair loss  joint pain  low blood counts - this  medicine may decrease the number of white blood cells, red blood cells and platelets. You may be at increased risk for infections and bleeding.  muscle pain  muscle weakness  persistent headache  redness, blistering, peeling or loosening of the skin,  including inside the mouth  signs and symptoms of high blood sugar such as dizziness; dry mouth; dry skin; fruity breath; nausea; stomach pain; increased hunger or thirst; increased urination  signs and symptoms of kidney injury like trouble passing urine or change in the amount of urine  signs and symptoms of liver injury like dark urine, light-colored stools, loss of appetite, nausea, right upper belly pain, yellowing of the eyes or skin  sweating  swollen lymph nodes  weight loss Side effects that usually do not require medical attention (report to your doctor or health care professional if they continue or are bothersome):  decreased appetite  muscle pain  tiredness This list may not describe all possible side effects. Call your doctor for medical advice about side effects. You may report side effects to FDA at 1-800-FDA-1088. Where should I keep my medicine? This drug is given in a hospital or clinic and will not be stored at home. NOTE: This sheet is a summary. It may not cover all possible information. If you have questions about this medicine, talk to your doctor, pharmacist, or health care provider.  2020 Elsevier/Gold Standard (2018-04-07 13:46:58)  Paclitaxel injection What is this medicine? PACLITAXEL (PAK li TAX el) is a chemotherapy drug. It targets fast dividing cells, like cancer cells, and causes these cells to die. This medicine is used to treat ovarian cancer, breast cancer, lung cancer, Kaposi's sarcoma, and other cancers. This medicine may be used for other purposes; ask your health care provider or pharmacist if you have questions. COMMON BRAND NAME(S): Onxol, Taxol What should I tell my health care provider before I take this medicine? They need to know if you have any of these conditions:  history of irregular heartbeat  liver disease  low blood counts, like low white cell, platelet, or red cell counts  lung or breathing disease, like  asthma  tingling of the fingers or toes, or other nerve disorder  an unusual or allergic reaction to paclitaxel, alcohol, polyoxyethylated castor oil, other chemotherapy, other medicines, foods, dyes, or preservatives  pregnant or trying to get pregnant  breast-feeding How should I use this medicine? This drug is given as an infusion into a vein. It is administered in a hospital or clinic by a specially trained health care professional. Talk to your pediatrician regarding the use of this medicine in children. Special care may be needed. Overdosage: If you think you have taken too much of this medicine contact a poison control center or emergency room at once. NOTE: This medicine is only for you. Do not share this medicine with others. What if I miss a dose? It is important not to miss your dose. Call your doctor or health care professional if you are unable to keep an appointment. What may interact with this medicine? Do not take this medicine with any of the following medications:  disulfiram  metronidazole This medicine may also interact with the following medications:  antiviral medicines for hepatitis, HIV or AIDS  certain antibiotics like erythromycin and clarithromycin  certain medicines for fungal infections like ketoconazole and itraconazole  certain medicines for seizures like carbamazepine, phenobarbital, phenytoin  gemfibrozil  nefazodone  rifampin  St. John's wort This list may not describe all possible interactions.  Give your health care provider a list of all the medicines, herbs, non-prescription drugs, or dietary supplements you use. Also tell them if you smoke, drink alcohol, or use illegal drugs. Some items may interact with your medicine. What should I watch for while using this medicine? Your condition will be monitored carefully while you are receiving this medicine. You will need important blood work done while you are taking this medicine. This  medicine can cause serious allergic reactions. To reduce your risk you will need to take other medicine(s) before treatment with this medicine. If you experience allergic reactions like skin rash, itching or hives, swelling of the face, lips, or tongue, tell your doctor or health care professional right away. In some cases, you may be given additional medicines to help with side effects. Follow all directions for their use. This drug may make you feel generally unwell. This is not uncommon, as chemotherapy can affect healthy cells as well as cancer cells. Report any side effects. Continue your course of treatment even though you feel ill unless your doctor tells you to stop. Call your doctor or health care professional for advice if you get a fever, chills or sore throat, or other symptoms of a cold or flu. Do not treat yourself. This drug decreases your body's ability to fight infections. Try to avoid being around people who are sick. This medicine may increase your risk to bruise or bleed. Call your doctor or health care professional if you notice any unusual bleeding. Be careful brushing and flossing your teeth or using a toothpick because you may get an infection or bleed more easily. If you have any dental work done, tell your dentist you are receiving this medicine. Avoid taking products that contain aspirin, acetaminophen, ibuprofen, naproxen, or ketoprofen unless instructed by your doctor. These medicines may hide a fever. Do not become pregnant while taking this medicine. Women should inform their doctor if they wish to become pregnant or think they might be pregnant. There is a potential for serious side effects to an unborn child. Talk to your health care professional or pharmacist for more information. Do not breast-feed an infant while taking this medicine. Men are advised not to father a child while receiving this medicine. This product may contain alcohol. Ask your pharmacist or healthcare  provider if this medicine contains alcohol. Be sure to tell all healthcare providers you are taking this medicine. Certain medicines, like metronidazole and disulfiram, can cause an unpleasant reaction when taken with alcohol. The reaction includes flushing, headache, nausea, vomiting, sweating, and increased thirst. The reaction can last from 30 minutes to several hours. What side effects may I notice from receiving this medicine? Side effects that you should report to your doctor or health care professional as soon as possible:  allergic reactions like skin rash, itching or hives, swelling of the face, lips, or tongue  breathing problems  changes in vision  fast, irregular heartbeat  high or low blood pressure  mouth sores  pain, tingling, numbness in the hands or feet  signs of decreased platelets or bleeding - bruising, pinpoint red spots on the skin, black, tarry stools, blood in the urine  signs of decreased red blood cells - unusually weak or tired, feeling faint or lightheaded, falls  signs of infection - fever or chills, cough, sore throat, pain or difficulty passing urine  signs and symptoms of liver injury like dark yellow or brown urine; general ill feeling or flu-like symptoms; light-colored stools; loss of  appetite; nausea; right upper belly pain; unusually weak or tired; yellowing of the eyes or skin  swelling of the ankles, feet, hands  unusually slow heartbeat Side effects that usually do not require medical attention (report to your doctor or health care professional if they continue or are bothersome):  diarrhea  hair loss  loss of appetite  muscle or joint pain  nausea, vomiting  pain, redness, or irritation at site where injected  tiredness This list may not describe all possible side effects. Call your doctor for medical advice about side effects. You may report side effects to FDA at 1-800-FDA-1088. Where should I keep my medicine? This drug is  given in a hospital or clinic and will not be stored at home. NOTE: This sheet is a summary. It may not cover all possible information. If you have questions about this medicine, talk to your doctor, pharmacist, or health care provider.  2020 Elsevier/Gold Standard (2016-11-12 13:14:55)  Carboplatin injection What is this medicine? CARBOPLATIN (KAR boe pla tin) is a chemotherapy drug. It targets fast dividing cells, like cancer cells, and causes these cells to die. This medicine is used to treat ovarian cancer and many other cancers. This medicine may be used for other purposes; ask your health care provider or pharmacist if you have questions. COMMON BRAND NAME(S): Paraplatin What should I tell my health care provider before I take this medicine? They need to know if you have any of these conditions:  blood disorders  hearing problems  kidney disease  recent or ongoing radiation therapy  an unusual or allergic reaction to carboplatin, cisplatin, other chemotherapy, other medicines, foods, dyes, or preservatives  pregnant or trying to get pregnant  breast-feeding How should I use this medicine? This drug is usually given as an infusion into a vein. It is administered in a hospital or clinic by a specially trained health care professional. Talk to your pediatrician regarding the use of this medicine in children. Special care may be needed. Overdosage: If you think you have taken too much of this medicine contact a poison control center or emergency room at once. NOTE: This medicine is only for you. Do not share this medicine with others. What if I miss a dose? It is important not to miss a dose. Call your doctor or health care professional if you are unable to keep an appointment. What may interact with this medicine?  medicines for seizures  medicines to increase blood counts like filgrastim, pegfilgrastim, sargramostim  some antibiotics like amikacin, gentamicin, neomycin,  streptomycin, tobramycin  vaccines Talk to your doctor or health care professional before taking any of these medicines:  acetaminophen  aspirin  ibuprofen  ketoprofen  naproxen This list may not describe all possible interactions. Give your health care provider a list of all the medicines, herbs, non-prescription drugs, or dietary supplements you use. Also tell them if you smoke, drink alcohol, or use illegal drugs. Some items may interact with your medicine. What should I watch for while using this medicine? Your condition will be monitored carefully while you are receiving this medicine. You will need important blood work done while you are taking this medicine. This drug may make you feel generally unwell. This is not uncommon, as chemotherapy can affect healthy cells as well as cancer cells. Report any side effects. Continue your course of treatment even though you feel ill unless your doctor tells you to stop. In some cases, you may be given additional medicines to help with side  effects. Follow all directions for their use. Call your doctor or health care professional for advice if you get a fever, chills or sore throat, or other symptoms of a cold or flu. Do not treat yourself. This drug decreases your body's ability to fight infections. Try to avoid being around people who are sick. This medicine may increase your risk to bruise or bleed. Call your doctor or health care professional if you notice any unusual bleeding. Be careful brushing and flossing your teeth or using a toothpick because you may get an infection or bleed more easily. If you have any dental work done, tell your dentist you are receiving this medicine. Avoid taking products that contain aspirin, acetaminophen, ibuprofen, naproxen, or ketoprofen unless instructed by your doctor. These medicines may hide a fever. Do not become pregnant while taking this medicine. Women should inform their doctor if they wish to become  pregnant or think they might be pregnant. There is a potential for serious side effects to an unborn child. Talk to your health care professional or pharmacist for more information. Do not breast-feed an infant while taking this medicine. What side effects may I notice from receiving this medicine? Side effects that you should report to your doctor or health care professional as soon as possible:  allergic reactions like skin rash, itching or hives, swelling of the face, lips, or tongue  signs of infection - fever or chills, cough, sore throat, pain or difficulty passing urine  signs of decreased platelets or bleeding - bruising, pinpoint red spots on the skin, black, tarry stools, nosebleeds  signs of decreased red blood cells - unusually weak or tired, fainting spells, lightheadedness  breathing problems  changes in hearing  changes in vision  chest pain  high blood pressure  low blood counts - This drug may decrease the number of white blood cells, red blood cells and platelets. You may be at increased risk for infections and bleeding.  nausea and vomiting  pain, swelling, redness or irritation at the injection site  pain, tingling, numbness in the hands or feet  problems with balance, talking, walking  trouble passing urine or change in the amount of urine Side effects that usually do not require medical attention (report to your doctor or health care professional if they continue or are bothersome):  hair loss  loss of appetite  metallic taste in the mouth or changes in taste This list may not describe all possible side effects. Call your doctor for medical advice about side effects. You may report side effects to FDA at 1-800-FDA-1088. Where should I keep my medicine? This drug is given in a hospital or clinic and will not be stored at home. NOTE: This sheet is a summary. It may not cover all possible information. If you have questions about this medicine, talk to  your doctor, pharmacist, or health care provider.  2020 Elsevier/Gold Standard (2007-06-16 14:38:05)

## 2019-01-22 ENCOUNTER — Other Ambulatory Visit: Payer: Self-pay

## 2019-01-22 ENCOUNTER — Inpatient Hospital Stay: Payer: 59

## 2019-01-22 VITALS — BP 114/76 | HR 79 | Temp 98.7°F | Resp 18

## 2019-01-22 DIAGNOSIS — C3491 Malignant neoplasm of unspecified part of right bronchus or lung: Secondary | ICD-10-CM

## 2019-01-22 DIAGNOSIS — C3411 Malignant neoplasm of upper lobe, right bronchus or lung: Secondary | ICD-10-CM | POA: Diagnosis not present

## 2019-01-22 MED ORDER — PEGFILGRASTIM INJECTION 6 MG/0.6ML ~~LOC~~
6.0000 mg | PREFILLED_SYRINGE | Freq: Once | SUBCUTANEOUS | Status: AC
  Administered 2019-01-22: 11:00:00 6 mg via SUBCUTANEOUS
  Filled 2019-01-22: qty 0.6

## 2019-01-22 NOTE — Progress Notes (Signed)
Ms. Schlagel was seen in the infusion room today.  She was up to go to the bathroom when the tubing that was attached to her IV with a phaseal became undone.  There was a blood over the patient's shirt sleeve and over her dorsal hand.  She had nearly completed her Taxol infusion with only approximately 5 mL of Taxol left in the bag.  It could not be determined if Taxol had leak from the line.  The patient's skin was examined and was found to be intact with no evidence of skin breakdown or erythema.  The area was cleaned the patient was able to complete the remainder of her chemotherapy.  It was decided to forego the last 5 or so mL of Taxol that had not been infused.  Patient was instructed to call or return to clinic should she see any changes in her skin.  Sandi Mealy, MHS, PA-C Physician Assistant

## 2019-01-22 NOTE — Patient Instructions (Signed)

## 2019-01-27 ENCOUNTER — Other Ambulatory Visit: Payer: Self-pay

## 2019-01-27 ENCOUNTER — Inpatient Hospital Stay: Payer: 59 | Attending: Physician Assistant

## 2019-01-27 DIAGNOSIS — R519 Headache, unspecified: Secondary | ICD-10-CM | POA: Insufficient documentation

## 2019-01-27 DIAGNOSIS — G4730 Sleep apnea, unspecified: Secondary | ICD-10-CM | POA: Diagnosis not present

## 2019-01-27 DIAGNOSIS — C3411 Malignant neoplasm of upper lobe, right bronchus or lung: Secondary | ICD-10-CM | POA: Diagnosis present

## 2019-01-27 DIAGNOSIS — Z79899 Other long term (current) drug therapy: Secondary | ICD-10-CM | POA: Diagnosis not present

## 2019-01-27 DIAGNOSIS — I10 Essential (primary) hypertension: Secondary | ICD-10-CM | POA: Diagnosis not present

## 2019-01-27 DIAGNOSIS — F329 Major depressive disorder, single episode, unspecified: Secondary | ICD-10-CM | POA: Insufficient documentation

## 2019-01-27 DIAGNOSIS — Z7982 Long term (current) use of aspirin: Secondary | ICD-10-CM | POA: Diagnosis not present

## 2019-01-27 DIAGNOSIS — J449 Chronic obstructive pulmonary disease, unspecified: Secondary | ICD-10-CM | POA: Insufficient documentation

## 2019-01-27 DIAGNOSIS — Z5112 Encounter for antineoplastic immunotherapy: Secondary | ICD-10-CM | POA: Diagnosis not present

## 2019-01-27 DIAGNOSIS — C3491 Malignant neoplasm of unspecified part of right bronchus or lung: Secondary | ICD-10-CM

## 2019-01-27 LAB — CMP (CANCER CENTER ONLY)
ALT: 11 U/L (ref 0–44)
AST: 13 U/L — ABNORMAL LOW (ref 15–41)
Albumin: 3.6 g/dL (ref 3.5–5.0)
Alkaline Phosphatase: 123 U/L (ref 38–126)
Anion gap: 12 (ref 5–15)
BUN: 32 mg/dL — ABNORMAL HIGH (ref 8–23)
CO2: 24 mmol/L (ref 22–32)
Calcium: 8.7 mg/dL — ABNORMAL LOW (ref 8.9–10.3)
Chloride: 102 mmol/L (ref 98–111)
Creatinine: 0.82 mg/dL (ref 0.44–1.00)
GFR, Est AFR Am: >60 mL/min (ref >60)
GFR, Estimated: >60 mL/min (ref >60)
Glucose, Bld: 96 mg/dL (ref 70–99)
Potassium: 3.3 mmol/L — ABNORMAL LOW (ref 3.5–5.1)
Sodium: 138 mmol/L (ref 135–145)
Total Bilirubin: <0.2 mg/dL — ABNORMAL LOW (ref 0.3–1.2)
Total Protein: 7.4 g/dL (ref 6.5–8.1)

## 2019-01-27 LAB — CBC WITH DIFFERENTIAL (CANCER CENTER ONLY)
Abs Immature Granulocytes: 0.15 10*3/uL — ABNORMAL HIGH (ref 0.00–0.07)
Basophils Absolute: 0.1 10*3/uL (ref 0.0–0.1)
Basophils Relative: 1 %
Eosinophils Absolute: 0.2 10*3/uL (ref 0.0–0.5)
Eosinophils Relative: 2 %
HCT: 29.9 % — ABNORMAL LOW (ref 36.0–46.0)
Hemoglobin: 9.7 g/dL — ABNORMAL LOW (ref 12.0–15.0)
Immature Granulocytes: 1 %
Lymphocytes Relative: 15 %
Lymphs Abs: 1.8 10*3/uL (ref 0.7–4.0)
MCH: 31.9 pg (ref 26.0–34.0)
MCHC: 32.4 g/dL (ref 30.0–36.0)
MCV: 98.4 fL (ref 80.0–100.0)
Monocytes Absolute: 1.3 10*3/uL — ABNORMAL HIGH (ref 0.1–1.0)
Monocytes Relative: 11 %
Neutro Abs: 8.4 10*3/uL — ABNORMAL HIGH (ref 1.7–7.7)
Neutrophils Relative %: 70 %
Platelet Count: 220 10*3/uL (ref 150–400)
RBC: 3.04 MIL/uL — ABNORMAL LOW (ref 3.87–5.11)
RDW: 15.8 % — ABNORMAL HIGH (ref 11.5–15.5)
WBC Count: 11.9 10*3/uL — ABNORMAL HIGH (ref 4.0–10.5)
nRBC: 0 % (ref 0.0–0.2)

## 2019-01-27 LAB — TSH: TSH: 1.492 u[IU]/mL (ref 0.308–3.960)

## 2019-02-03 ENCOUNTER — Other Ambulatory Visit: Payer: Self-pay

## 2019-02-03 ENCOUNTER — Inpatient Hospital Stay: Payer: 59

## 2019-02-03 DIAGNOSIS — C3411 Malignant neoplasm of upper lobe, right bronchus or lung: Secondary | ICD-10-CM | POA: Diagnosis not present

## 2019-02-03 DIAGNOSIS — C3491 Malignant neoplasm of unspecified part of right bronchus or lung: Secondary | ICD-10-CM

## 2019-02-03 LAB — CBC WITH DIFFERENTIAL (CANCER CENTER ONLY)
Abs Immature Granulocytes: 0.04 10*3/uL (ref 0.00–0.07)
Basophils Absolute: 0.1 10*3/uL (ref 0.0–0.1)
Basophils Relative: 1 %
Eosinophils Absolute: 0.1 10*3/uL (ref 0.0–0.5)
Eosinophils Relative: 1 %
HCT: 31.2 % — ABNORMAL LOW (ref 36.0–46.0)
Hemoglobin: 10.4 g/dL — ABNORMAL LOW (ref 12.0–15.0)
Immature Granulocytes: 1 %
Lymphocytes Relative: 26 %
Lymphs Abs: 1.6 10*3/uL (ref 0.7–4.0)
MCH: 32.2 pg (ref 26.0–34.0)
MCHC: 33.3 g/dL (ref 30.0–36.0)
MCV: 96.6 fL (ref 80.0–100.0)
Monocytes Absolute: 0.5 10*3/uL (ref 0.1–1.0)
Monocytes Relative: 8 %
Neutro Abs: 4.1 10*3/uL (ref 1.7–7.7)
Neutrophils Relative %: 63 %
Platelet Count: 213 10*3/uL (ref 150–400)
RBC: 3.23 MIL/uL — ABNORMAL LOW (ref 3.87–5.11)
RDW: 17.7 % — ABNORMAL HIGH (ref 11.5–15.5)
WBC Count: 6.3 10*3/uL (ref 4.0–10.5)
nRBC: 0 % (ref 0.0–0.2)

## 2019-02-03 LAB — CMP (CANCER CENTER ONLY)
ALT: 11 U/L (ref 0–44)
AST: 11 U/L — ABNORMAL LOW (ref 15–41)
Albumin: 3.7 g/dL (ref 3.5–5.0)
Alkaline Phosphatase: 102 U/L (ref 38–126)
Anion gap: 10 (ref 5–15)
BUN: 23 mg/dL (ref 8–23)
CO2: 26 mmol/L (ref 22–32)
Calcium: 9.3 mg/dL (ref 8.9–10.3)
Chloride: 102 mmol/L (ref 98–111)
Creatinine: 0.84 mg/dL (ref 0.44–1.00)
GFR, Est AFR Am: >60 mL/min (ref >60)
GFR, Estimated: >60 mL/min (ref >60)
Glucose, Bld: 91 mg/dL (ref 70–99)
Potassium: 3.5 mmol/L (ref 3.5–5.1)
Sodium: 138 mmol/L (ref 135–145)
Total Bilirubin: 0.4 mg/dL (ref 0.3–1.2)
Total Protein: 7.7 g/dL (ref 6.5–8.1)

## 2019-02-05 ENCOUNTER — Telehealth: Payer: Self-pay | Admitting: Emergency Medicine

## 2019-02-05 MED ORDER — STIOLTO RESPIMAT 2.5-2.5 MCG/ACT IN AERS: 2.0000 | INHALATION_SPRAY | Freq: Every day | RESPIRATORY_TRACT | 5 refills | Status: DC

## 2019-02-05 NOTE — Telephone Encounter (Signed)
Refill for stiolto sent to pt's preferred pharmacy. Called and spoke with pt letting her know this had been done and pt verbalized understanding. Nothing further needed.

## 2019-02-10 ENCOUNTER — Other Ambulatory Visit: Payer: Self-pay | Admitting: Internal Medicine

## 2019-02-10 ENCOUNTER — Inpatient Hospital Stay (HOSPITAL_BASED_OUTPATIENT_CLINIC_OR_DEPARTMENT_OTHER): Payer: 59 | Admitting: Physician Assistant

## 2019-02-10 ENCOUNTER — Other Ambulatory Visit: Payer: Self-pay

## 2019-02-10 ENCOUNTER — Other Ambulatory Visit: Payer: Self-pay | Admitting: Physician Assistant

## 2019-02-10 ENCOUNTER — Inpatient Hospital Stay: Payer: 59 | Admitting: Nutrition

## 2019-02-10 ENCOUNTER — Inpatient Hospital Stay: Payer: 59

## 2019-02-10 VITALS — BP 119/85 | HR 74 | Temp 98.2°F | Resp 18 | Wt 134.2 lb

## 2019-02-10 DIAGNOSIS — C3491 Malignant neoplasm of unspecified part of right bronchus or lung: Secondary | ICD-10-CM

## 2019-02-10 DIAGNOSIS — E876 Hypokalemia: Secondary | ICD-10-CM | POA: Diagnosis not present

## 2019-02-10 DIAGNOSIS — Z5112 Encounter for antineoplastic immunotherapy: Secondary | ICD-10-CM | POA: Diagnosis not present

## 2019-02-10 DIAGNOSIS — Z5111 Encounter for antineoplastic chemotherapy: Secondary | ICD-10-CM

## 2019-02-10 DIAGNOSIS — C3411 Malignant neoplasm of upper lobe, right bronchus or lung: Secondary | ICD-10-CM | POA: Diagnosis not present

## 2019-02-10 LAB — CMP (CANCER CENTER ONLY)
ALT: 9 U/L (ref 0–44)
AST: 11 U/L — ABNORMAL LOW (ref 15–41)
Albumin: 3.9 g/dL (ref 3.5–5.0)
Alkaline Phosphatase: 71 U/L (ref 38–126)
Anion gap: 12 (ref 5–15)
BUN: 35 mg/dL — ABNORMAL HIGH (ref 8–23)
CO2: 24 mmol/L (ref 22–32)
Calcium: 9 mg/dL (ref 8.9–10.3)
Chloride: 102 mmol/L (ref 98–111)
Creatinine: 0.92 mg/dL (ref 0.44–1.00)
GFR, Est AFR Am: >60 mL/min (ref >60)
GFR, Estimated: >60 mL/min (ref >60)
Glucose, Bld: 84 mg/dL (ref 70–99)
Potassium: 3.3 mmol/L — ABNORMAL LOW (ref 3.5–5.1)
Sodium: 138 mmol/L (ref 135–145)
Total Bilirubin: 0.6 mg/dL (ref 0.3–1.2)
Total Protein: 7.5 g/dL (ref 6.5–8.1)

## 2019-02-10 LAB — CBC WITH DIFFERENTIAL (CANCER CENTER ONLY)
Abs Immature Granulocytes: 0 10*3/uL (ref 0.00–0.07)
Basophils Absolute: 0.1 10*3/uL (ref 0.0–0.1)
Basophils Relative: 1 %
Eosinophils Absolute: 0.1 10*3/uL (ref 0.0–0.5)
Eosinophils Relative: 2 %
HCT: 31.4 % — ABNORMAL LOW (ref 36.0–46.0)
Hemoglobin: 10.3 g/dL — ABNORMAL LOW (ref 12.0–15.0)
Immature Granulocytes: 0 %
Lymphocytes Relative: 39 %
Lymphs Abs: 1.4 10*3/uL (ref 0.7–4.0)
MCH: 32.7 pg (ref 26.0–34.0)
MCHC: 32.8 g/dL (ref 30.0–36.0)
MCV: 99.7 fL (ref 80.0–100.0)
Monocytes Absolute: 0.6 10*3/uL (ref 0.1–1.0)
Monocytes Relative: 16 %
Neutro Abs: 1.5 10*3/uL — ABNORMAL LOW (ref 1.7–7.7)
Neutrophils Relative %: 42 %
Platelet Count: 237 10*3/uL (ref 150–400)
RBC: 3.15 MIL/uL — ABNORMAL LOW (ref 3.87–5.11)
RDW: 18 % — ABNORMAL HIGH (ref 11.5–15.5)
WBC Count: 3.5 10*3/uL — ABNORMAL LOW (ref 4.0–10.5)
nRBC: 0 % (ref 0.0–0.2)

## 2019-02-10 MED ORDER — LIDOCAINE-PRILOCAINE 2.5-2.5 % EX CREA: 1.0000 "application " | TOPICAL_CREAM | CUTANEOUS | 0 refills | Status: DC | PRN

## 2019-02-10 MED ORDER — SODIUM CHLORIDE 0.9 % IV SOLN
Freq: Once | INTRAVENOUS | Status: AC
  Administered 2019-02-10: 10:00:00 via INTRAVENOUS
  Filled 2019-02-10: qty 5

## 2019-02-10 MED ORDER — FAMOTIDINE IN NACL 20-0.9 MG/50ML-% IV SOLN
INTRAVENOUS | Status: AC
  Filled 2019-02-10: qty 50

## 2019-02-10 MED ORDER — SODIUM CHLORIDE 0.9 % IV SOLN
Freq: Once | INTRAVENOUS | Status: AC
  Administered 2019-02-10: 10:00:00 via INTRAVENOUS
  Filled 2019-02-10: qty 250

## 2019-02-10 MED ORDER — PALONOSETRON HCL INJECTION 0.25 MG/5ML
INTRAVENOUS | Status: AC
  Filled 2019-02-10: qty 5

## 2019-02-10 MED ORDER — SODIUM CHLORIDE 0.9 % IV SOLN
200.0000 mg | Freq: Once | INTRAVENOUS | Status: AC
  Administered 2019-02-10: 200 mg via INTRAVENOUS
  Filled 2019-02-10: qty 8

## 2019-02-10 MED ORDER — DIPHENHYDRAMINE HCL 50 MG/ML IJ SOLN
25.0000 mg | Freq: Once | INTRAMUSCULAR | Status: AC
  Administered 2019-02-10: 25 mg via INTRAVENOUS

## 2019-02-10 MED ORDER — PALONOSETRON HCL INJECTION 0.25 MG/5ML
0.2500 mg | Freq: Once | INTRAVENOUS | Status: AC
  Administered 2019-02-10: 0.25 mg via INTRAVENOUS

## 2019-02-10 MED ORDER — FAMOTIDINE IN NACL 20-0.9 MG/50ML-% IV SOLN
20.0000 mg | Freq: Once | INTRAVENOUS | Status: AC
  Administered 2019-02-10: 20 mg via INTRAVENOUS

## 2019-02-10 MED ORDER — SODIUM CHLORIDE 0.9 % IV SOLN
175.0000 mg/m2 | Freq: Once | INTRAVENOUS | Status: AC
  Administered 2019-02-10: 276 mg via INTRAVENOUS
  Filled 2019-02-10: qty 46

## 2019-02-10 MED ORDER — SODIUM CHLORIDE 0.9 % IV SOLN
355.5000 mg | Freq: Once | INTRAVENOUS | Status: AC
  Administered 2019-02-10: 360 mg via INTRAVENOUS
  Filled 2019-02-10: qty 36

## 2019-02-10 MED ORDER — DIPHENHYDRAMINE HCL 50 MG/ML IJ SOLN
INTRAMUSCULAR | Status: AC
  Filled 2019-02-10: qty 1

## 2019-02-10 NOTE — Patient Instructions (Signed)
[facility] Discharge Instructions for Patients Receiving Chemotherapy  Today you received the following chemotherapy agents: Keytruda, Taxol, Carboplatin  To help prevent nausea and vomiting after your treatment, we encourage you to take your nausea medication as directed.   If you develop nausea and vomiting that is not controlled by your nausea medication, call the clinic.   BELOW ARE SYMPTOMS THAT SHOULD BE REPORTED IMMEDIATELY:  *FEVER GREATER THAN 100.5 F  *CHILLS WITH OR WITHOUT FEVER  NAUSEA AND VOMITING THAT IS NOT CONTROLLED WITH YOUR NAUSEA MEDICATION  *UNUSUAL SHORTNESS OF BREATH  *UNUSUAL BRUISING OR BLEEDING  TENDERNESS IN MOUTH AND THROAT WITH OR WITHOUT PRESENCE OF ULCERS  *URINARY PROBLEMS  *BOWEL PROBLEMS  UNUSUAL RASH Items with * indicate a potential emergency and should be followed up as soon as possible.  Feel free to call the clinic should you have any questions or concerns. The clinic phone number is [phone].  Please show the [place] at check-in to the Emergency Department and triage nurse.

## 2019-02-10 NOTE — Progress Notes (Signed)
[facility] OFFICE PROGRESS NOTE  [name], MD [address] [place] [address]  DIAGNOSIS: Stage IV non-small cell lung cancer, squamous cell carcinoma. She presented with right upper lobe lung mass in addition to pleural-based metastasis andmediastinal lymphadenopathy. She was diagnosed in September 2020.   PRIOR THERAPY: None  CURRENT THERAPY: Chemotherapy with carboplatin for an AUC of 5,paclitaxel 175 mg/m, and Keytruda 200 mg IV every 3 weeks with Neulasta support. Firsttreatmenton 12/09/2018. Status post 3 cycles.  INTERVAL HISTORY: Debra Rodgers 73 y.o. female returns to the clinic for a follow up visit. The patient is feeling well today except for some left lateral lower rib pain. She denies any recent traumas or injuries. She states the pain is achy and comes and goes. She has not needed to take any pain medication for the pain. She is followed closely by pulmonology for her shortness of breath/COPD. She recently was prescribed inhalers which have been helping. She denies any significant cough. She denies hemoptysis.   She has been tolerating her treatment with chemotherapy without any concerning adverse side effects. She denies any fevers, chills, night sweats, or weight loss. She denies any nausea, vomiting, diarrhea, or constipation. She occasionally gets a headache that last about 15 minutes across her forehead. These resolve spontaneously and does not require any medication to alleviate the pain. She denies any rashes or skin changes except for some hardness across a vein in her right forearm which occurred after she "bumped it". She is here for evaluation before starting cycle #4.   MEDICAL HISTORY: Past Medical History:  Diagnosis Date  . COPD (chronic obstructive pulmonary disease) ([place])   . Depression   . Essential hypertension   . Headache   . History of migraine headaches   . Sleep apnea   . Tobacco use disorder      ALLERGIES:  is allergic to codeine sulfate and pantoprazole sodium.  MEDICATIONS:  Current Outpatient Medications  Medication Sig Dispense Refill  . acetaminophen (TYLENOL) 325 MG tablet Take 2 tablets (650 mg total) by mouth every 6 (six) hours as needed. 30 tablet 0  . amLODipine (NORVASC) 10 MG tablet Take 1 tablet (10 mg total) by mouth daily. 90 tablet 1  . aspirin 81 MG EC tablet Take 81 mg by mouth daily.      Marland Kitchen atorvastatin (LIPITOR) 20 MG tablet TAKE 1 TABLET BY MOUTH EVERY DAY 90 tablet 3  . chlorthalidone (HYGROTON) 50 MG tablet TAKE 1 TABLET BY MOUTH EVERY DAY 90 tablet 1  . famotidine (PEPCID) 20 MG tablet Take 20 mg by mouth daily.     Marland Kitchen FLUoxetine (PROZAC) 20 MG capsule Take 1 capsule (20 mg total) by mouth daily. 30 capsule 5  . gabapentin (NEURONTIN) 100 MG capsule Take 1 capsule (100 mg total) by mouth 2 (two) times daily. 180 capsule 3  . HYDROcodone-acetaminophen (NORCO) 7.5-325 MG tablet Take 1 tablet by mouth every 6 (six) hours as needed for moderate pain. 60 tablet 0  . lidocaine-prilocaine (EMLA) cream Apply 1 application topically as needed. 30 g 0  . lisinopril (ZESTRIL) 10 MG tablet TAKE 1 TABLET BY MOUTH EVERY DAY 90 tablet 1  . nicotine ([employer] NICOTINE TRANSDERMAL SYS) 14 mg/24hr patch Place 1 patch (14 mg total) onto the skin daily. 42 patch 0  . prochlorperazine (COMPAZINE) 10 MG tablet Take 1 tablet (10 mg total) by mouth every 6 (six) hours as needed for nausea or vomiting. 30 tablet  2  . Tiotropium Bromide-Olodaterol (STIOLTO RESPIMAT) 2.5-2.5 MCG/ACT AERS Inhale 2 puffs into the lungs daily. 4 g 5  . VENTOLIN HFA 108 (90 Base) MCG/ACT inhaler INHALE 1-2 PUFFS INTO THE LUNGS EVERY 6 (SIX) HOURS AS NEEDED FOR WHEEZING OR SHORTNESS OF BREATH. 18 Inhaler 0   No current facility-administered medications for this visit.    Facility-Administered Medications Ordered in Other Visits  Medication Dose Route Frequency Provider Last Rate Last Dose  . CARBOplatin  (PARAPLATIN) 360 mg in sodium chloride 0.9 % 250 mL chemo infusion  360 mg Intravenous Once Curt Bears, MD      . PACLitaxel (TAXOL) 276 mg in sodium chloride 0.9 % 250 mL chemo infusion (> 31m/m2)  175 mg/m2 (Treatment Plan Recorded) Intravenous Once MCurt Bears MD 99 mL/hr at 02/10/19 1115 276 mg at 02/10/19 1115    SURGICAL HISTORY:  Past Surgical History:  Procedure Laterality Date  . ABDOMINAL HYSTERECTOMY    . CHOLECYSTECTOMY    . ROTATOR CUFF REPAIR  3/04  . VIDEO BRONCHOSCOPY WITH ENDOBRONCHIAL ULTRASOUND Right 11/25/2018   Procedure: VIDEO BRONCHOSCOPY WITH ENDOBRONCHIAL ULTRASOUND;  Surgeon: BCollene Gobble MD;  Location: [facility] OR;  Service: Thoracic;  Laterality: Right;    REVIEW OF SYSTEMS:   Review of Systems  Constitutional: Negative for appetite change, chills, fatigue, fever and unexpected weight change.  HENT: Negative for mouth sores, nosebleeds, sore throat and trouble swallowing.   Eyes: Negative for eye problems and icterus.  Respiratory: Positive for baseline shortness of breath with exertion and cough. Negative for hemoptysis and wheezing.   Cardiovascular: Positive for left lower lateral rib pain. Negative for leg swelling.  Gastrointestinal: Negative for abdominal pain, constipation, diarrhea, nausea and vomiting.  Genitourinary: Negative for bladder incontinence, difficulty urinating, dysuria, frequency and hematuria.   Musculoskeletal: Negative for back pain, gait problem, neck pain and neck stiffness.  Skin: Negative for itching and rash.  Neurological: Positive for occasional mild headache. Negative for dizziness, extremity weakness, gait problem, light-headedness and seizures.  Hematological: Negative for adenopathy. Does not bruise/bleed easily.  Psychiatric/Behavioral: Negative for confusion, depression and sleep disturbance. The patient is not nervous/anxious.     PHYSICAL EXAMINATION:  There were no vitals taken for this visit.  ECOG  PERFORMANCE STATUS: 1 - Symptomatic but completely ambulatory  Physical Exam  Constitutional: Oriented to person, place, and time and well-developed, well-nourished, and in no distress.  HENT:  Head: Normocephalic and atraumatic.  Mouth/Throat: Oropharynx is clear and moist. No oropharyngeal exudate.  Eyes: Conjunctivae are normal. Right eye exhibits no discharge. Left eye exhibits no discharge. No scleral icterus.  Neck: Normal range of motion. Neck supple.  Cardiovascular: Normal rate, regular rhythm, normal heart sounds and intact distal pulses.   Pulmonary/Chest: Effort normal and breath sounds normal. No respiratory distress. No wheezes. No rales.  Abdominal: Soft. Bowel sounds are normal. Exhibits no distension and no mass. There is no tenderness.  Musculoskeletal: Tenderness over the left lateral rib. Normal range of motion. Exhibits no edema.  Lymphadenopathy:    No cervical adenopathy.  Neurological: Alert and oriented to person, place, and time. Exhibits normal muscle tone. Gait normal. Coordination normal.  Skin: Skin is warm and dry. No rash noted. Not diaphoretic. No erythema. No pallor.  Psychiatric: Mood, memory and judgment normal.  Vitals reviewed.  LABORATORY DATA: Lab Results  Component Value Date   WBC 3.5 (L) 02/10/2019   HGB 10.3 (L) 02/10/2019   HCT 31.4 (L) 02/10/2019   MCV 99.7 02/10/2019  PLT 237 02/10/2019      Chemistry      Component Value Date/Time   NA 138 02/10/2019 0812   NA 139 07/21/2017 0958   K 3.3 (L) 02/10/2019 0812   CL 102 02/10/2019 0812   CO2 24 02/10/2019 0812   BUN 35 (H) 02/10/2019 0812   BUN 26 07/21/2017 0958   CREATININE 0.92 02/10/2019 0812   CREATININE 0.79 09/03/2013 1540      Component Value Date/Time   CALCIUM 9.0 02/10/2019 0812   ALKPHOS 71 02/10/2019 0812   AST 11 (L) 02/10/2019 0812   ALT 9 02/10/2019 0812   BILITOT 0.6 02/10/2019 0812       RADIOGRAPHIC STUDIES:  No results found.   ASSESSMENT/PLAN:   This is a very Debra 73 year old African-American female diagnosed with stage IV non-small cell lung cancer, squamous cell carcinoma. She presented with a right upper lobe lung mass and pleural based metastases and mediastinal lymphadenopathy.She was diagnosed in August 2020   The patient is currently undergoing systemicchemotherapy with carboplatin for an AUC of 5, paclitaxel 175 mg/m, and Keytruda 200 mg IV every 3 weeks with Neulasta support.She is status post 3 cycles of treatment and she tolerated it wellwithout any concerning adverse effects except for mild fatigue.   Labs were reviewed. Recommend that she proceed with cycle #4 today as scheduled. Her potassium is a little bit low today. She was encouraged to increase her dietary intake of potassium rich foods. She will be given a handout of potassium rich food on her AVS today.   I will arrange for the patient to have a restaging CT scan of the chest, abdomen, and pelvis prior to her next visit. We will also be able to evaluate the region causing her achy left sided pain.   We will see her back for a follow up visit in 3 weeks for evaluation and to review her scan results before starting cycle #5.   The patient is interested in a port-a-cath placement. I have placed a referral. I have sent EMLA cream to her pharmacy.   She is scheduled to meet with the cancer center nutritionist team regarding her prior weight loss.   The patient was advised to call immediately if she has any concerning symptoms in the interval. The patient voices understanding of current disease status and treatment options and is in agreement with the current care plan. All questions were answered. The patient knows to call the clinic with any problems, questions or concerns. We can certainly see the patient much sooner if necessary   Orders Placed This Encounter  Procedures  . IR Perc Tun Perit Cath W/Port    Standing Status:   Future    Standing  Expiration Date:   04/11/2020    Order Specific Question:   Reason for exam:    Answer:   Chemotherapy with poor venous access    Order Specific Question:   Preferred Imaging Location?    Answer:   [facility], PA-C 02/10/19

## 2019-02-10 NOTE — Progress Notes (Signed)
Nutrition follow-up completed with patient during infusion for non-small cell lung cancer. Weight has improved and was documented as 134.25 pounds November 18. This is increased from 124.9 pounds October 7. Patient denies nutrition impact symptoms. She continues to consume [employer]. She has no questions.  Nutrition diagnosis: Unintended weight loss improved.  Intervention: Patient educated to continue [employer] twice daily.  Provided samples and coupons. Encourage small frequent meals and snacks. Teach back method used.  Monitoring, evaluation, goals: Patient will tolerate adequate calories and protein to minimize weight loss.  Next visit: Wednesday, December 9 during infusion.  **Disclaimer: This note was dictated with voice recognition software. Similar sounding words can inadvertently be transcribed and this note may contain transcription errors which may not have been corrected upon publication of note.**

## 2019-02-11 ENCOUNTER — Other Ambulatory Visit: Payer: Self-pay | Admitting: Physician Assistant

## 2019-02-12 ENCOUNTER — Inpatient Hospital Stay: Payer: 59

## 2019-02-12 ENCOUNTER — Other Ambulatory Visit: Payer: Self-pay

## 2019-02-12 VITALS — BP 103/74 | HR 79 | Temp 98.4°F

## 2019-02-12 DIAGNOSIS — C3411 Malignant neoplasm of upper lobe, right bronchus or lung: Secondary | ICD-10-CM | POA: Diagnosis not present

## 2019-02-12 DIAGNOSIS — C3491 Malignant neoplasm of unspecified part of right bronchus or lung: Secondary | ICD-10-CM

## 2019-02-12 MED ORDER — PEGFILGRASTIM INJECTION 6 MG/0.6ML ~~LOC~~
PREFILLED_SYRINGE | SUBCUTANEOUS | Status: AC
  Filled 2019-02-12: qty 0.6

## 2019-02-12 MED ORDER — PEGFILGRASTIM INJECTION 6 MG/0.6ML ~~LOC~~
6.0000 mg | PREFILLED_SYRINGE | Freq: Once | SUBCUTANEOUS | Status: AC
  Administered 2019-02-12: 6 mg via SUBCUTANEOUS

## 2019-02-12 NOTE — Patient Instructions (Signed)

## 2019-02-17 ENCOUNTER — Other Ambulatory Visit: Payer: Self-pay

## 2019-02-17 ENCOUNTER — Inpatient Hospital Stay: Payer: 59

## 2019-02-17 DIAGNOSIS — C3411 Malignant neoplasm of upper lobe, right bronchus or lung: Secondary | ICD-10-CM | POA: Diagnosis not present

## 2019-02-17 DIAGNOSIS — C3491 Malignant neoplasm of unspecified part of right bronchus or lung: Secondary | ICD-10-CM

## 2019-02-17 LAB — CBC WITH DIFFERENTIAL (CANCER CENTER ONLY)
Abs Immature Granulocytes: 0.2 10*3/uL — ABNORMAL HIGH (ref 0.00–0.07)
Basophils Absolute: 0.1 10*3/uL (ref 0.0–0.1)
Basophils Relative: 1 %
Eosinophils Absolute: 0.2 10*3/uL (ref 0.0–0.5)
Eosinophils Relative: 2 %
HCT: 29.1 % — ABNORMAL LOW (ref 36.0–46.0)
Hemoglobin: 9.6 g/dL — ABNORMAL LOW (ref 12.0–15.0)
Immature Granulocytes: 2 %
Lymphocytes Relative: 21 %
Lymphs Abs: 2 10*3/uL (ref 0.7–4.0)
MCH: 32.9 pg (ref 26.0–34.0)
MCHC: 33 g/dL (ref 30.0–36.0)
MCV: 99.7 fL (ref 80.0–100.0)
Monocytes Absolute: 1.3 10*3/uL — ABNORMAL HIGH (ref 0.1–1.0)
Monocytes Relative: 13 %
Neutro Abs: 6.1 10*3/uL (ref 1.7–7.7)
Neutrophils Relative %: 61 %
Platelet Count: 156 10*3/uL (ref 150–400)
RBC: 2.92 MIL/uL — ABNORMAL LOW (ref 3.87–5.11)
RDW: 18.1 % — ABNORMAL HIGH (ref 11.5–15.5)
WBC Count: 9.9 10*3/uL (ref 4.0–10.5)
nRBC: 0 % (ref 0.0–0.2)

## 2019-02-17 LAB — CMP (CANCER CENTER ONLY)
ALT: 17 U/L (ref 0–44)
AST: 16 U/L (ref 15–41)
Albumin: 3.9 g/dL (ref 3.5–5.0)
Alkaline Phosphatase: 102 U/L (ref 38–126)
Anion gap: 13 (ref 5–15)
BUN: 27 mg/dL — ABNORMAL HIGH (ref 8–23)
CO2: 24 mmol/L (ref 22–32)
Calcium: 9.4 mg/dL (ref 8.9–10.3)
Chloride: 102 mmol/L (ref 98–111)
Creatinine: 0.8 mg/dL (ref 0.44–1.00)
GFR, Est AFR Am: >60 mL/min (ref >60)
GFR, Estimated: >60 mL/min (ref >60)
Glucose, Bld: 73 mg/dL (ref 70–99)
Potassium: 3.7 mmol/L (ref 3.5–5.1)
Sodium: 139 mmol/L (ref 135–145)
Total Bilirubin: 0.4 mg/dL (ref 0.3–1.2)
Total Protein: 7.4 g/dL (ref 6.5–8.1)

## 2019-02-17 LAB — TSH: TSH: 2.777 u[IU]/mL (ref 0.308–3.960)

## 2019-02-23 ENCOUNTER — Other Ambulatory Visit: Payer: Self-pay | Admitting: Radiology

## 2019-02-24 ENCOUNTER — Ambulatory Visit (HOSPITAL_COMMUNITY)
Admission: RE | Admit: 2019-02-24 | Discharge: 2019-02-24 | Disposition: A | Discharge status: Home / Self Care | Payer: 59 | Source: Ambulatory Visit | Attending: Physician Assistant | Admitting: Physician Assistant

## 2019-02-24 ENCOUNTER — Encounter (HOSPITAL_COMMUNITY): Payer: Self-pay

## 2019-02-24 ENCOUNTER — Other Ambulatory Visit: Payer: Self-pay | Admitting: Physician Assistant

## 2019-02-24 ENCOUNTER — Other Ambulatory Visit: Payer: Self-pay

## 2019-02-24 ENCOUNTER — Other Ambulatory Visit: Payer: 59

## 2019-02-24 DIAGNOSIS — C3490 Malignant neoplasm of unspecified part of unspecified bronchus or lung: Secondary | ICD-10-CM | POA: Insufficient documentation

## 2019-02-24 DIAGNOSIS — Z7982 Long term (current) use of aspirin: Secondary | ICD-10-CM | POA: Insufficient documentation

## 2019-02-24 DIAGNOSIS — G4730 Sleep apnea, unspecified: Secondary | ICD-10-CM | POA: Insufficient documentation

## 2019-02-24 DIAGNOSIS — I10 Essential (primary) hypertension: Secondary | ICD-10-CM | POA: Diagnosis not present

## 2019-02-24 DIAGNOSIS — Z9221 Personal history of antineoplastic chemotherapy: Secondary | ICD-10-CM | POA: Diagnosis not present

## 2019-02-24 DIAGNOSIS — F329 Major depressive disorder, single episode, unspecified: Secondary | ICD-10-CM | POA: Insufficient documentation

## 2019-02-24 DIAGNOSIS — Z79899 Other long term (current) drug therapy: Secondary | ICD-10-CM | POA: Diagnosis not present

## 2019-02-24 DIAGNOSIS — Z87891 Personal history of nicotine dependence: Secondary | ICD-10-CM | POA: Diagnosis not present

## 2019-02-24 DIAGNOSIS — C3491 Malignant neoplasm of unspecified part of right bronchus or lung: Secondary | ICD-10-CM

## 2019-02-24 DIAGNOSIS — J449 Chronic obstructive pulmonary disease, unspecified: Secondary | ICD-10-CM | POA: Insufficient documentation

## 2019-02-24 HISTORY — PX: IR IMAGING GUIDED PORT INSERTION: IMG5740

## 2019-02-24 LAB — COMPREHENSIVE METABOLIC PANEL
ALT: 14 U/L (ref 0–44)
AST: 16 U/L (ref 15–41)
Albumin: 4.2 g/dL (ref 3.5–5.0)
Alkaline Phosphatase: 88 U/L (ref 38–126)
Anion gap: 11 (ref 5–15)
BUN: 40 mg/dL — ABNORMAL HIGH (ref 8–23)
CO2: 24 mmol/L (ref 22–32)
Calcium: 9.1 mg/dL (ref 8.9–10.3)
Chloride: 102 mmol/L (ref 98–111)
Creatinine, Ser: 0.9 mg/dL (ref 0.44–1.00)
GFR calc Af Amer: >60 mL/min (ref >60)
GFR calc non Af Amer: >60 mL/min (ref >60)
Glucose, Bld: 85 mg/dL (ref 70–99)
Potassium: 3.7 mmol/L (ref 3.5–5.1)
Sodium: 137 mmol/L (ref 135–145)
Total Bilirubin: 0.5 mg/dL (ref 0.3–1.2)
Total Protein: 7.7 g/dL (ref 6.5–8.1)

## 2019-02-24 LAB — CBC WITH DIFFERENTIAL/PLATELET
Abs Immature Granulocytes: 0.06 10*3/uL (ref 0.00–0.07)
Basophils Absolute: 0.1 10*3/uL (ref 0.0–0.1)
Basophils Relative: 1 %
Eosinophils Absolute: 0.1 10*3/uL (ref 0.0–0.5)
Eosinophils Relative: 1 %
HCT: 33.2 % — ABNORMAL LOW (ref 36.0–46.0)
Hemoglobin: 10.7 g/dL — ABNORMAL LOW (ref 12.0–15.0)
Immature Granulocytes: 1 %
Lymphocytes Relative: 24 %
Lymphs Abs: 1.9 10*3/uL (ref 0.7–4.0)
MCH: 32.9 pg (ref 26.0–34.0)
MCHC: 32.2 g/dL (ref 30.0–36.0)
MCV: 102.2 fL — ABNORMAL HIGH (ref 80.0–100.0)
Monocytes Absolute: 0.6 10*3/uL (ref 0.1–1.0)
Monocytes Relative: 8 %
Neutro Abs: 5.1 10*3/uL (ref 1.7–7.7)
Neutrophils Relative %: 65 %
Platelets: 212 10*3/uL (ref 150–400)
RBC: 3.25 MIL/uL — ABNORMAL LOW (ref 3.87–5.11)
RDW: 18.8 % — ABNORMAL HIGH (ref 11.5–15.5)
WBC: 7.7 10*3/uL (ref 4.0–10.5)
nRBC: 0 % (ref 0.0–0.2)

## 2019-02-24 LAB — PROTIME-INR
INR: 0.9 (ref 0.8–1.2)
Prothrombin Time: 12.1 seconds (ref 11.4–15.2)

## 2019-02-24 LAB — TSH: TSH: 1.84 u[IU]/mL (ref 0.350–4.500)

## 2019-02-24 MED ORDER — HEPARIN SOD (PORK) LOCK FLUSH 100 UNIT/ML IV SOLN
INTRAVENOUS | Status: AC
  Filled 2019-02-24: qty 5

## 2019-02-24 MED ORDER — LIDOCAINE-EPINEPHRINE (PF) 2 %-1:200000 IJ SOLN
INTRAMUSCULAR | Status: AC | PRN
  Administered 2019-02-24 (×2): 10 mL

## 2019-02-24 MED ORDER — CEFAZOLIN SODIUM-DEXTROSE 2-4 GM/100ML-% IV SOLN
2.0000 g | Freq: Once | INTRAVENOUS | Status: AC
  Administered 2019-02-24: 15:00:00 2 g via INTRAVENOUS

## 2019-02-24 MED ORDER — SODIUM CHLORIDE 0.9 % IV SOLN
INTRAVENOUS | Status: DC
  Administered 2019-02-24: 12:00:00 via INTRAVENOUS

## 2019-02-24 MED ORDER — FENTANYL CITRATE (PF) 100 MCG/2ML IJ SOLN
INTRAMUSCULAR | Status: AC | PRN
  Administered 2019-02-24 (×2): 50 ug via INTRAVENOUS

## 2019-02-24 MED ORDER — MIDAZOLAM HCL 2 MG/2ML IJ SOLN
INTRAMUSCULAR | Status: AC | PRN
  Administered 2019-02-24 (×3): 1 mg via INTRAVENOUS

## 2019-02-24 MED ORDER — FENTANYL CITRATE (PF) 100 MCG/2ML IJ SOLN
INTRAMUSCULAR | Status: AC
  Filled 2019-02-24: qty 2

## 2019-02-24 MED ORDER — MIDAZOLAM HCL 2 MG/2ML IJ SOLN
INTRAMUSCULAR | Status: AC
  Filled 2019-02-24: qty 2

## 2019-02-24 MED ORDER — HEPARIN SOD (PORK) LOCK FLUSH 100 UNIT/ML IV SOLN
INTRAVENOUS | Status: AC | PRN
  Administered 2019-02-24: 500 [IU] via INTRAVENOUS

## 2019-02-24 MED ORDER — CEFAZOLIN SODIUM-DEXTROSE 2-4 GM/100ML-% IV SOLN
INTRAVENOUS | Status: AC
  Administered 2019-02-24: 2 g via INTRAVENOUS
  Filled 2019-02-24: qty 100

## 2019-02-24 MED ORDER — LIDOCAINE-EPINEPHRINE (PF) 2 %-1:200000 IJ SOLN
INTRAMUSCULAR | Status: AC
  Filled 2019-02-24: qty 20

## 2019-02-24 NOTE — Consult Note (Signed)
Chief Complaint: Patient was seen in consultation today for Port-A-Cath placement  Referring Physician(s): Mohamed,M  Supervising Physician: Jacqulynn Cadet  Patient Status: [facility] - Out-pt  History of Present Illness: Debra Rodgers is a 73 y.o. female , ex-smoker, with history of COPD, hypertension, and stage IV squamous cell carcinoma of the lung who presented with right upper lobe lung mass in addition to pleural-based metastasis and mediastinal lymphadenopathy diagnosed in September of this year.  She is currently undergoing chemotherapy.  She has poor venous access and presents today for Port-A-Cath placement for additional treatment.  Past Medical History:  Diagnosis Date  . COPD (chronic obstructive pulmonary disease) ([place])   . Depression   . Essential hypertension   . Headache   . History of migraine headaches   . Sleep apnea   . Tobacco use disorder     Past Surgical History:  Procedure Laterality Date  . ABDOMINAL HYSTERECTOMY    . CHOLECYSTECTOMY    . ROTATOR CUFF REPAIR  3/04  . VIDEO BRONCHOSCOPY WITH ENDOBRONCHIAL ULTRASOUND Right 11/25/2018   Procedure: VIDEO BRONCHOSCOPY WITH ENDOBRONCHIAL ULTRASOUND;  Surgeon: Collene Gobble, MD;  Location: [facility];  Service: Thoracic;  Laterality: Right;    Allergies: Codeine sulfate and Pantoprazole sodium  Medications: Prior to Admission medications   Medication Sig Start Date End Date Taking? Authorizing Provider  acetaminophen (TYLENOL) 325 MG tablet Take 2 tablets (650 mg total) by mouth every 6 (six) hours as needed. 11/05/18  Yes Nanavati, Ankit, MD  amLODipine (NORVASC) 10 MG tablet Take 1 tablet (10 mg total) by mouth daily. 12/24/18  Yes [name], MD  aspirin 81 MG EC tablet Take 81 mg by mouth daily.     Yes [provider]  atorvastatin (LIPITOR) 20 MG tablet TAKE 1 TABLET BY MOUTH EVERY DAY 10/06/18  Yes [name], MD  chlorthalidone (HYGROTON) 50 MG tablet TAKE 1 TABLET BY  MOUTH EVERY DAY 01/20/19  Yes [name], MD  famotidine (PEPCID) 20 MG tablet Take 20 mg by mouth daily.    Yes [provider]  FLUoxetine (PROZAC) 20 MG capsule Take 1 capsule (20 mg total) by mouth daily. 01/20/19  Yes [name], MD  HYDROcodone-acetaminophen ([place]) 7.5-325 MG tablet Take 1 tablet by mouth every 6 (six) hours as needed for moderate pain. 12/30/18  Yes Heilingoetter, Cassandra L, PA-C  lisinopril (ZESTRIL) 10 MG tablet TAKE 1 TABLET BY MOUTH EVERY DAY 01/20/19  Yes [name], MD  nicotine ([employer] NICOTINE TRANSDERMAL SYS) 14 mg/24hr patch Place 1 patch (14 mg total) onto the skin daily. 06/09/18  Yes [name], MD  prochlorperazine (COMPAZINE) 10 MG tablet Take 1 tablet (10 mg total) by mouth every 6 (six) hours as needed for nausea or vomiting. 12/02/18  Yes Heilingoetter, Cassandra L, PA-C  Tiotropium Bromide-Olodaterol (STIOLTO RESPIMAT) 2.5-2.5 MCG/ACT AERS Inhale 2 puffs into the lungs daily. 02/05/19  Yes Collene Gobble, MD  VENTOLIN HFA 108 (90 Base) MCG/ACT inhaler INHALE 1-2 PUFFS INTO THE LUNGS EVERY 6 (SIX) HOURS AS NEEDED FOR WHEEZING OR SHORTNESS OF BREATH. 08/07/17  Yes [name], MD  gabapentin (NEURONTIN) 100 MG capsule Take 1 capsule (100 mg total) by mouth 2 (two) times daily. 10/13/17   [name], MD  lidocaine-prilocaine (EMLA) cream Apply 1 application topically as needed. 02/10/19   Heilingoetter, Cassandra L, PA-C     Family History  Problem Relation Age of Onset  . Hypertension Mother   .  Stroke Mother   . Coronary artery disease Mother   . Heart disease Father   . Diabetes Sister   . Hypertension Sister   . Cancer Sister   . Breast cancer Sister     Social History   Socioeconomic History  . Marital status: Married    Spouse name: Not on file  . Number of children: Not on file  . Years of education: Not on file  . Highest education level: Not on file   Occupational History  . Not on file  Social Needs  . Financial resource strain: Not on file  . Food insecurity    Worry: Not on file    Inability: Not on file  . Transportation needs    Medical: Not on file    Non-medical: Not on file  Tobacco Use  . Smoking status: Former Smoker    Packs/day: 1.00    Years: 56.00    Pack years: 56.00    Types: Cigarettes    Start date: 1964    Quit date: 11/04/2018    Years since quitting: 0.3  . Smokeless tobacco: Never Used  Substance and Sexual Activity  . Alcohol use: No    Alcohol/week: 0.0 standard drinks  . Drug use: No  . Sexual activity: Never    Partners: Male  Lifestyle  . Physical activity    Days per week: Not on file    Minutes per session: Not on file  . Stress: Not on file  Relationships  . Social Herbalist on phone: Not on file    Gets together: Not on file    Attends religious service: Not on file    Active member of club or organization: Not on file    Attends meetings of clubs or organizations: Not on file    Relationship status: Not on file  Other Topics Concern  . Not on file  Social History Narrative   Married, Regular Exercise- yes      Review of Systems currently denies fever, headache, chest pain, dyspnea, cough, abdominal/back pain, nausea, vomiting or bleeding.  Vital Signs: BP 104/74   Pulse 68   Temp 99 F (37.2 C) (Oral)   Resp 16   SpO2 100%   Physical Exam awake, alert.  Chest with clear but distant breath sounds bilaterally.  Heart with regular rate and rhythm.  Abdomen soft, positive bowel sounds, nontender.  No lower extremity edema.  Imaging: No results found.  Labs:  CBC: Recent Labs    02/03/19 1033 02/10/19 0812 02/17/19 1028 02/24/19 1149  WBC 6.3 3.5* 9.9 7.7  HGB 10.4* 10.3* 9.6* 10.7*  HCT 31.2* 31.4* 29.1* 33.2*  PLT 213 237 156 212    COAGS: Recent Labs    11/24/18 1322 02/24/19 1149  INR 1.0 0.9  APTT 28  --     BMP: Recent Labs     02/03/19 1033 02/10/19 0812 02/17/19 1028 02/24/19 1149  NA 138 138 139 137  K 3.5 3.3* 3.7 3.7  CL 102 102 102 102  CO2 26 24 24 24   GLUCOSE 91 84 73 85  BUN 23 35* 27* 40*  CALCIUM 9.3 9.0 9.4 9.1  CREATININE 0.84 0.92 0.80 0.90  GFRNONAA >60 >60 >60 >60  GFRAA >60 >60 >60 >60    LIVER FUNCTION TESTS: Recent Labs    02/03/19 1033 02/10/19 0812 02/17/19 1028 02/24/19 1149  BILITOT 0.4 0.6 0.4 0.5  AST 11* 11* 16 16  ALT  11 9 17 14   ALKPHOS 102 71 102 88  PROT 7.7 7.5 7.4 7.7  ALBUMIN 3.7 3.9 3.9 4.2    TUMOR MARKERS: No results for input(s): AFPTM, CEA, CA199, CHROMGRNA in the last 8760 hours.  Assessment and Plan: 73 y.o. female , ex-smoker, with history of COPD, hypertension, and stage IV squamous cell carcinoma of the lung who presented with right upper lobe lung mass in addition to pleural-based metastasis and mediastinal lymphadenopathy diagnosed in September of this year.  She is currently undergoing chemotherapy.  She has poor venous access and presents today for Port-A-Cath placement for additional treatment.Risks and benefits of image guided port-a-catheter placement was discussed with the patient including, but not limited to bleeding, infection, pneumothorax, or fibrin sheath development and need for additional procedures.  All of the patient's questions were answered, patient is agreeable to proceed. Consent signed and in chart.      Thank you for this interesting consult.  I greatly enjoyed meeting Leni Pankonin and look forward to participating in their care.  A copy of this report was sent to the requesting provider on this date.  Electronically Signed: D. Rowe Robert, PA-C 02/24/2019, 1:15 PM   I spent a total of 25 minutes   in face to face in clinical consultation, greater than 50% of which was counseling/coordinating care for Port-A-Cath placement

## 2019-02-24 NOTE — Procedures (Signed)
Interventional Radiology Procedure Note  Procedure: Placement of a right IJ approach single lumen PowerPort.  Tip is positioned at the superior cavoatrial junction and catheter is ready for immediate use.  Complications: No immediate Recommendations:  - Ok to shower tomorrow - Do not submerge for 7 days - Routine line care   Signed,  Oluwasemilore Bahl K. Lanyiah Brix, MD   

## 2019-02-24 NOTE — Discharge Instructions (Signed)
Please do not use any lotion or EMLA cream over port site until ALL glue and steri-strips have come off on their own.   Implanted Port Insertion, Care After This sheet gives you information about how to care for yourself after your procedure. Your health care provider may also give you more specific instructions. If you have problems or questions, contact your health care provider. What can I expect after the procedure? After the procedure, it is common to have:  Discomfort at the port insertion site.  Bruising on the skin over the port. This should improve over 3-4 days. Follow these instructions at home: [facility] care  After your port is placed, you will get a manufacturer's information card. The card has information about your port. Keep this card with you at all times.  Take care of the port as told by your health care provider. Ask your health care provider if you or a family member can get training for taking care of the port at home. A home health care nurse may also take care of the port.  Make sure to remember what type of port you have. Incision care  Follow instructions from your health care provider about how to take care of your port insertion site. Make sure you: ? Wash your hands with soap and water before and after you change your bandage (dressing). If soap and water are not available, use hand sanitizer. ? Change your dressing as told by your health care provider. ? Leave stitches (sutures), skin glue, or adhesive strips in place. These skin closures may need to stay in place for 2 weeks or longer. If adhesive strip edges start to loosen and curl up, you may trim the loose edges. Do not remove adhesive strips completely unless your health care provider tells you to do that.  Check your port insertion site every day for signs of infection. Check for: ? Redness, swelling, or pain. ? Fluid or blood. ? Warmth. ? Pus or a bad smell. Activity  Return to your normal activities  as told by your health care provider. Ask your health care provider what activities are safe for you.  Do not lift anything that is heavier than 10 lb (4.5 kg), or the limit that you are told, until your health care provider says that it is safe. General instructions  Take over-the-counter and prescription medicines only as told by your health care provider.  Do not take baths, swim, or use a hot tub until your health care provider approves. Ask your health care provider if you may take showers. You may only be allowed to take sponge baths.  Do not drive for 24 hours if you were given a sedative during your procedure.  Wear a medical alert bracelet in case of an emergency. This will tell any health care providers that you have a port.  Keep all follow-up visits as told by your health care provider. This is important. Contact a health care provider if:  You cannot flush your port with saline as directed, or you cannot draw blood from the port.  You have a fever or chills.  You have redness, swelling, or pain around your port insertion site.  You have fluid or blood coming from your port insertion site.  Your port insertion site feels warm to the touch.  You have pus or a bad smell coming from the port insertion site. Get help right away if:  You have chest pain or shortness of breath.  You  have bleeding from your port that you cannot control. Summary  Take care of the port as told by your health care provider. Keep the manufacturer's information card with you at all times.  Change your dressing as told by your health care provider.  Contact a health care provider if you have a fever or chills or if you have redness, swelling, or pain around your port insertion site.  Keep all follow-up visits as told by your health care provider. This information is not intended to replace advice given to you by your health care provider. Make sure you discuss any questions you have with your  health care provider. Document Released: 12/30/2012 Document Revised: 10/07/2017 Document Reviewed: 10/07/2017 Elsevier Patient Education  [place].    Moderate Conscious Sedation, Adult, Care After These instructions provide you with information about caring for yourself after your procedure. Your health care provider may also give you more specific instructions. Your treatment has been planned according to current medical practices, but problems sometimes occur. Call your health care provider if you have any problems or questions after your procedure. What can I expect after the procedure? After your procedure, it is common:  To feel sleepy for several hours.  To feel clumsy and have poor balance for several hours.  To have poor judgment for several hours.  To vomit if you eat too soon. Follow these instructions at home: For at least 24 hours after the procedure:   Do not: ? Participate in activities where you could fall or become injured. ? Drive. ? Use heavy machinery. ? Drink alcohol. ? Take sleeping pills or medicines that cause drowsiness. ? Make important decisions or sign legal documents. ? Take care of children on your own.  Rest. Eating and drinking  Follow the diet recommended by your health care provider.  If you vomit: ? Drink water, juice, or soup when you can drink without vomiting. ? Make sure you have little or no nausea before eating solid foods. General instructions  Have a responsible adult stay with you until you are awake and alert.  Take over-the-counter and prescription medicines only as told by your health care provider.  If you smoke, do not smoke without supervision.  Keep all follow-up visits as told by your health care provider. This is important. Contact a health care provider if:  You keep feeling nauseous or you keep vomiting.  You feel light-headed.  You develop a rash.  You have a fever. Get help right away  if:  You have trouble breathing. This information is not intended to replace advice given to you by your health care provider. Make sure you discuss any questions you have with your health care provider. Document Released: 12/30/2012 Document Revised: 02/21/2017 Document Reviewed: 07/01/2015 Elsevier Patient Education  2020 [employer].

## 2019-02-26 ENCOUNTER — Encounter (HOSPITAL_COMMUNITY): Payer: Self-pay

## 2019-02-26 ENCOUNTER — Ambulatory Visit (HOSPITAL_COMMUNITY)
Admission: RE | Admit: 2019-02-26 | Discharge: 2019-02-26 | Disposition: A | Discharge status: Home / Self Care | Payer: 59 | Source: Ambulatory Visit | Attending: Physician Assistant | Admitting: Physician Assistant

## 2019-02-26 DIAGNOSIS — C3491 Malignant neoplasm of unspecified part of right bronchus or lung: Secondary | ICD-10-CM

## 2019-02-26 HISTORY — DX: Malignant (primary) neoplasm, unspecified: C80.1

## 2019-02-26 MED ORDER — IOHEXOL 9 MG/ML PO SOLN
ORAL | Status: AC
  Filled 2019-02-26: qty 500

## 2019-02-26 MED ORDER — IOHEXOL 300 MG/ML  SOLN
100.0000 mL | Freq: Once | INTRAMUSCULAR | Status: AC | PRN
  Administered 2019-02-26: 100 mL via INTRAVENOUS

## 2019-02-26 MED ORDER — SODIUM CHLORIDE (PF) 0.9 % IJ SOLN
INTRAMUSCULAR | Status: AC
  Filled 2019-02-26: qty 50

## 2019-02-26 MED ORDER — IOHEXOL 9 MG/ML PO SOLN
500.0000 mL | ORAL | Status: AC
  Administered 2019-02-26: 500 mL via ORAL

## 2019-03-03 ENCOUNTER — Other Ambulatory Visit: Payer: Self-pay

## 2019-03-03 ENCOUNTER — Inpatient Hospital Stay: Payer: 59 | Admitting: Nutrition

## 2019-03-03 ENCOUNTER — Inpatient Hospital Stay: Payer: 59 | Attending: Physician Assistant

## 2019-03-03 ENCOUNTER — Inpatient Hospital Stay: Payer: 59

## 2019-03-03 ENCOUNTER — Inpatient Hospital Stay (HOSPITAL_BASED_OUTPATIENT_CLINIC_OR_DEPARTMENT_OTHER): Payer: 59 | Admitting: Physician Assistant

## 2019-03-03 ENCOUNTER — Encounter: Payer: Self-pay | Admitting: Physician Assistant

## 2019-03-03 VITALS — BP 113/71 | HR 77 | Temp 98.5°F | Resp 15 | Ht 61.0 in | Wt 138.4 lb

## 2019-03-03 DIAGNOSIS — Z7982 Long term (current) use of aspirin: Secondary | ICD-10-CM | POA: Insufficient documentation

## 2019-03-03 DIAGNOSIS — Z5112 Encounter for antineoplastic immunotherapy: Secondary | ICD-10-CM | POA: Insufficient documentation

## 2019-03-03 DIAGNOSIS — C3491 Malignant neoplasm of unspecified part of right bronchus or lung: Secondary | ICD-10-CM

## 2019-03-03 DIAGNOSIS — C782 Secondary malignant neoplasm of pleura: Secondary | ICD-10-CM | POA: Insufficient documentation

## 2019-03-03 DIAGNOSIS — F329 Major depressive disorder, single episode, unspecified: Secondary | ICD-10-CM | POA: Insufficient documentation

## 2019-03-03 DIAGNOSIS — J449 Chronic obstructive pulmonary disease, unspecified: Secondary | ICD-10-CM | POA: Insufficient documentation

## 2019-03-03 DIAGNOSIS — Z79899 Other long term (current) drug therapy: Secondary | ICD-10-CM | POA: Insufficient documentation

## 2019-03-03 DIAGNOSIS — I714 Abdominal aortic aneurysm, without rupture: Secondary | ICD-10-CM | POA: Insufficient documentation

## 2019-03-03 DIAGNOSIS — I10 Essential (primary) hypertension: Secondary | ICD-10-CM

## 2019-03-03 DIAGNOSIS — C3411 Malignant neoplasm of upper lobe, right bronchus or lung: Secondary | ICD-10-CM | POA: Diagnosis not present

## 2019-03-03 DIAGNOSIS — I2510 Atherosclerotic heart disease of native coronary artery without angina pectoris: Secondary | ICD-10-CM | POA: Diagnosis not present

## 2019-03-03 LAB — CMP (CANCER CENTER ONLY)
ALT: 11 U/L (ref 0–44)
AST: 16 U/L (ref 15–41)
Albumin: 3.9 g/dL (ref 3.5–5.0)
Alkaline Phosphatase: 76 U/L (ref 38–126)
Anion gap: 8 (ref 5–15)
BUN: 24 mg/dL — ABNORMAL HIGH (ref 8–23)
CO2: 28 mmol/L (ref 22–32)
Calcium: 8.8 mg/dL — ABNORMAL LOW (ref 8.9–10.3)
Chloride: 103 mmol/L (ref 98–111)
Creatinine: 0.94 mg/dL (ref 0.44–1.00)
GFR, Est AFR Am: >60 mL/min (ref >60)
GFR, Estimated: >60 mL/min (ref >60)
Glucose, Bld: 82 mg/dL (ref 70–99)
Potassium: 3.4 mmol/L — ABNORMAL LOW (ref 3.5–5.1)
Sodium: 139 mmol/L (ref 135–145)
Total Bilirubin: 0.7 mg/dL (ref 0.3–1.2)
Total Protein: 7.4 g/dL (ref 6.5–8.1)

## 2019-03-03 LAB — CBC WITH DIFFERENTIAL (CANCER CENTER ONLY)
Abs Immature Granulocytes: 0 10*3/uL (ref 0.00–0.07)
Basophils Absolute: 0 10*3/uL (ref 0.0–0.1)
Basophils Relative: 2 %
Eosinophils Absolute: 0.1 10*3/uL (ref 0.0–0.5)
Eosinophils Relative: 3 %
HCT: 31.3 % — ABNORMAL LOW (ref 36.0–46.0)
Hemoglobin: 10.1 g/dL — ABNORMAL LOW (ref 12.0–15.0)
Immature Granulocytes: 0 %
Lymphocytes Relative: 41 %
Lymphs Abs: 1.1 10*3/uL (ref 0.7–4.0)
MCH: 33 pg (ref 26.0–34.0)
MCHC: 32.3 g/dL (ref 30.0–36.0)
MCV: 102.3 fL — ABNORMAL HIGH (ref 80.0–100.0)
Monocytes Absolute: 0.5 10*3/uL (ref 0.1–1.0)
Monocytes Relative: 18 %
Neutro Abs: 0.9 10*3/uL — ABNORMAL LOW (ref 1.7–7.7)
Neutrophils Relative %: 36 %
Platelet Count: 207 10*3/uL (ref 150–400)
RBC: 3.06 MIL/uL — ABNORMAL LOW (ref 3.87–5.11)
RDW: 17.9 % — ABNORMAL HIGH (ref 11.5–15.5)
WBC Count: 2.6 10*3/uL — ABNORMAL LOW (ref 4.0–10.5)
nRBC: 0 % (ref 0.0–0.2)

## 2019-03-03 MED ORDER — SODIUM CHLORIDE 0.9 % IV SOLN
200.0000 mg | Freq: Once | INTRAVENOUS | Status: AC
  Administered 2019-03-03: 200 mg via INTRAVENOUS
  Filled 2019-03-03: qty 8

## 2019-03-03 MED ORDER — SODIUM CHLORIDE 0.9 % IV SOLN
Freq: Once | INTRAVENOUS | Status: AC
  Administered 2019-03-03: 12:00:00 via INTRAVENOUS
  Filled 2019-03-03: qty 250

## 2019-03-03 NOTE — Progress Notes (Signed)
Okay to treat today with ANC 0.9, per Dr. Julien Nordmann.

## 2019-03-03 NOTE — Progress Notes (Signed)
[facility] OFFICE PROGRESS NOTE  [name], MD [address] [place] [address]  DIAGNOSIS: Stage IV non-small cell lung cancer, squamous cell carcinoma. She presented with right upper lobe lung mass in addition to pleural-based metastasis andmediastinal lymphadenopathy. She was diagnosed in September 2020.   PRIOR THERAPY: None  CURRENT THERAPY: Chemotherapy with carboplatin for an AUC of 5,paclitaxel 175 mg/m, and Keytruda 200 mg IV every 3 weeks with Neulasta support. Firsttreatmenton 12/09/2018.Status post 4 cycles  INTERVAL HISTORY: Debra Rodgers 73 y.o. female returns to the clinic for a follow-up visit.  The patient is feeling well today without any concerning complaints.  She has been tolerating her chemotherapy well without any concerning adverse side effects.  She denies any fever, chills, night sweats, or weight loss.  She reports her baseline shortness of breath with exertion as well as her baseline mildly productive cough which produces clear sputum.  She denies any hemoptysis. The lower left rib/right upper quadrant pain that she had been experiencing prior is "not as bad as it was".  The patient has not needed to take any over-the-counter pain relievers to assist with this pain.  She denies any nausea, vomiting, diarrhea, or constipation.  She denies any headaches but she states that she has been having some bilateral visual blurring which she attributes to "needing to go to the eye doctor".  She denies any rashes or skin changes.  She recently had a restaging CT scan performed.  She is here today for evaluation and to review her scan results before starting her cycle #5, which will be her first treatment of maintenance Keytruda.   MEDICAL HISTORY: Past Medical History:  Diagnosis Date  . COPD (chronic obstructive pulmonary disease) ([place])   . Depression   . Essential hypertension   . Headache   . History of migraine headaches   . lung  ca dx'd 10/2018  . Sleep apnea   . Tobacco use disorder     ALLERGIES:  is allergic to codeine sulfate and pantoprazole sodium.  MEDICATIONS:  Current Outpatient Medications  Medication Sig Dispense Refill  . acetaminophen (TYLENOL) 325 MG tablet Take 2 tablets (650 mg total) by mouth every 6 (six) hours as needed. 30 tablet 0  . amLODipine (NORVASC) 10 MG tablet Take 1 tablet (10 mg total) by mouth daily. 90 tablet 1  . aspirin 81 MG EC tablet Take 81 mg by mouth daily.      Marland Kitchen atorvastatin (LIPITOR) 20 MG tablet TAKE 1 TABLET BY MOUTH EVERY DAY 90 tablet 3  . chlorthalidone (HYGROTON) 50 MG tablet TAKE 1 TABLET BY MOUTH EVERY DAY 90 tablet 1  . famotidine (PEPCID) 20 MG tablet Take 20 mg by mouth daily.     Marland Kitchen FLUoxetine (PROZAC) 20 MG capsule Take 1 capsule (20 mg total) by mouth daily. 30 capsule 5  . gabapentin (NEURONTIN) 100 MG capsule Take 1 capsule (100 mg total) by mouth 2 (two) times daily. 180 capsule 3  . HYDROcodone-acetaminophen (NORCO) 7.5-325 MG tablet Take 1 tablet by mouth every 6 (six) hours as needed for moderate pain. 60 tablet 0  . lidocaine-prilocaine (EMLA) cream Apply 1 application topically as needed. 30 g 0  . lisinopril (ZESTRIL) 10 MG tablet TAKE 1 TABLET BY MOUTH EVERY DAY 90 tablet 1  . prochlorperazine (COMPAZINE) 10 MG tablet Take 1 tablet (10 mg total) by mouth every 6 (six) hours as needed for nausea or vomiting. 30 tablet 2  .  Tiotropium Bromide-Olodaterol (STIOLTO RESPIMAT) 2.5-2.5 MCG/ACT AERS Inhale 2 puffs into the lungs daily. 4 g 5  . nicotine ([employer] NICOTINE TRANSDERMAL SYS) 14 mg/24hr patch Place 1 patch (14 mg total) onto the skin daily. (Patient not taking: Reported on 03/03/2019) 42 patch 0   No current facility-administered medications for this visit.    Facility-Administered Medications Ordered in Other Visits  Medication Dose Route Frequency Provider Last Rate Last Dose  . pembrolizumab (KEYTRUDA) 200 mg in sodium chloride 0.9 % 50 mL chemo  infusion  200 mg Intravenous Once Curt Bears, MD        SURGICAL HISTORY:  Past Surgical History:  Procedure Laterality Date  . ABDOMINAL HYSTERECTOMY    . CHOLECYSTECTOMY    . IR IMAGING GUIDED PORT INSERTION  02/24/2019  . ROTATOR CUFF REPAIR  3/04  . VIDEO BRONCHOSCOPY WITH ENDOBRONCHIAL ULTRASOUND Right 11/25/2018   Procedure: VIDEO BRONCHOSCOPY WITH ENDOBRONCHIAL ULTRASOUND;  Surgeon: Collene Gobble, MD;  Location: [facility] OR;  Service: Thoracic;  Laterality: Right;    REVIEW OF SYSTEMS:   Review of Systems  Constitutional: Negative for appetite change, chills, fatigue, fever and unexpected weight change.  HENT: Negative for mouth sores, nosebleeds, sore throat and trouble swallowing.   Eyes: Negative for eye problems and icterus.  Respiratory: Positive for baseline dyspnea on exertion and cough. Negative for hemoptysis and wheezing.   Cardiovascular: Improved left lower lateral rib pain. Negative for leg swelling.  Gastrointestinal: Negative for abdominal pain, constipation, diarrhea, nausea and vomiting.  Genitourinary: Negative for bladder incontinence, difficulty urinating, dysuria, frequency and hematuria.   Musculoskeletal: Negative for back pain, gait problem, neck pain and neck stiffness.  Skin: Negative for itching and rash.  Neurological: Negative for dizziness, extremity weakness, gait problem, headaches, light-headedness and seizures.  Hematological: Negative for adenopathy. Does not bruise/bleed easily.  Psychiatric/Behavioral: Negative for confusion, depression and sleep disturbance. The patient is not nervous/anxious.     PHYSICAL EXAMINATION:  Blood pressure 113/71, pulse 77, temperature 98.5 F (36.9 C), temperature source Temporal, resp. rate 15, height 5' 1"  (1.549 m), weight 138 lb 6.4 oz (62.8 kg), SpO2 100 %.  ECOG PERFORMANCE STATUS: 1 - Symptomatic but completely ambulatory  Physical Exam  Constitutional: Oriented to person, place, and time and  well-developed, well-nourished, and in no distress.  HENT:  Head: Normocephalic and atraumatic.  Mouth/Throat: Oropharynx is clear and moist. No oropharyngeal exudate.  Eyes: Conjunctivae are normal. Right eye exhibits no discharge. Left eye exhibits no discharge. No scleral icterus.  Neck: Normal range of motion. Neck supple.  Cardiovascular: Normal rate, regular rhythm, normal heart sounds and intact distal pulses.  Pulmonary/Chest: Effort normal and breath sounds normal. No respiratory distress. No wheezes. No rales.  Abdominal: Soft. Bowel sounds are normal. Exhibits no distension and no mass. There is no tenderness.  Musculoskeletal: No tenderness to palpation of the left lower ribs or any part of the anterior chest wall.  Normal range of motion. Exhibits no edema.  Lymphadenopathy:    No cervical adenopathy.  Neurological: Alert and oriented to person, place, and time. Exhibits normal muscle tone. Gait normal. Coordination normal.  Skin: Skin is warm and dry. No rash noted. Not diaphoretic. No erythema. No pallor.  Psychiatric: Mood, memory and judgment normal.  Vitals reviewed. LABORATORY DATA: Lab Results  Component Value Date   WBC 2.6 (L) 03/03/2019   HGB 10.1 (L) 03/03/2019   HCT 31.3 (L) 03/03/2019   MCV 102.3 (H) 03/03/2019   PLT 207 03/03/2019  Chemistry      Component Value Date/Time   NA 139 03/03/2019 1021   NA 139 07/21/2017 0958   K 3.4 (L) 03/03/2019 1021   CL 103 03/03/2019 1021   CO2 28 03/03/2019 1021   BUN 24 (H) 03/03/2019 1021   BUN 26 07/21/2017 0958   CREATININE 0.94 03/03/2019 1021   CREATININE 0.79 09/03/2013 1540      Component Value Date/Time   CALCIUM 8.8 (L) 03/03/2019 1021   ALKPHOS 76 03/03/2019 1021   AST 16 03/03/2019 1021   ALT 11 03/03/2019 1021   BILITOT 0.7 03/03/2019 1021       RADIOGRAPHIC STUDIES:  Ct Chest W Contrast  Result Date: 02/27/2019 CLINICAL DATA:  Stage IV squamous cell lung cancer,  chemotherapy/immunotherapy ongoing EXAM: CT CHEST, ABDOMEN, AND PELVIS WITH CONTRAST TECHNIQUE: Multidetector CT imaging of the chest, abdomen and pelvis was performed following the standard protocol during bolus administration of intravenous contrast. CONTRAST:  142m OMNIPAQUE IOHEXOL 300 MG/ML  SOLN COMPARISON:  PET-CT dated 11/18/2018. CT chest dated 11/07/2018. CT abdomen/pelvis dated 11/05/2018. FINDINGS: CT CHEST FINDINGS Cardiovascular: The heart is normal in size. No pericardial effusion. No evidence of thoracic aortic aneurysm. Atherosclerotic calcifications of the aortic arch. Coronary atherosclerosis of the LAD. Right chest port terminates at the cavoatrial junction. Mediastinum/Nodes: Thoracic lymphadenopathy, improved, including: 8 mm short axis high right paratracheal node (series 2/image 23) 15 mm short axis low right paratracheal node (series 2/image 28), previously 24 mm 13 mm short axis subcarinal node (series 2/image 36), previously 19 mm Visualized thyroid is unremarkable. Lungs/Pleura: Near complete resolution of prior pulmonary/pleural-based metastases in the right hemithorax. Residual 10 x 19 mm subpleural nodular opacity in the posterolateral right middle lobe inferiorly (series 6/image 113), corresponding to the prior 3.7 x 4.0 cm mass. Additional 8 mm subpleural nodular opacity in the posterolateral right middle lobe superiorly (series 6/image 88). Moderate centrilobular and paraseptal emphysematous changes, upper lung predominant. No focal consolidation. No pleural effusion or pneumothorax. Musculoskeletal: Degenerative changes of the thoracic spine. Cervical spine fixation hardware. CT ABDOMEN PELVIS FINDINGS Hepatobiliary: Scattered small right hepatic lobe cysts measuring up to 10 mm (series 2/image 60), unchanged. Status post cholecystectomy. Mild central intrahepatic and extrahepatic ductal dilatation. Pancreas: Within normal limits. Spleen: Within normal limits. Adrenals/Urinary  Tract: Adrenal glands are within normal limits. Kidneys are within normal limits.  No hydronephrosis. Thick-walled bladder. Stomach/Bowel: Stomach is within normal limits. No evidence of bowel obstruction. Appendix is not discretely visualized. Vascular/Lymphatic: 4.0 x 4.3 cm suprarenal abdominal aortic aneurysm, grossly unchanged. Atherosclerotic calcifications of the abdominal aorta and branch vessels. No suspicious abdominopelvic lymphadenopathy. Reproductive: Status post hysterectomy. Right ovary is within normal limits. 5.9 x 6.0 cm left ovarian cystic lesion, grossly unchanged. Other: No abdominopelvic ascites. Musculoskeletal: Degenerative changes of the lumbar spine, most prominent at L5-S1. IMPRESSION: Improving pulmonary metastases. Improving thoracic nodal metastases. Stable 4.3 cm abdominal aortic aneurysm. In this patient with known malignancy, attention on follow-up is suggested. Additional stable ancillary findings as above. Electronically Signed   By: SJulian HyM.D.   On: 02/27/2019 12:42   Ct Abdomen Pelvis W Contrast  Result Date: 02/27/2019 CLINICAL DATA:  Stage IV squamous cell lung cancer, chemotherapy/immunotherapy ongoing EXAM: CT CHEST, ABDOMEN, AND PELVIS WITH CONTRAST TECHNIQUE: Multidetector CT imaging of the chest, abdomen and pelvis was performed following the standard protocol during bolus administration of intravenous contrast. CONTRAST:  1062mOMNIPAQUE IOHEXOL 300 MG/ML  SOLN COMPARISON:  PET-CT dated 11/18/2018. CT chest dated 11/07/2018.  CT abdomen/pelvis dated 11/05/2018. FINDINGS: CT CHEST FINDINGS Cardiovascular: The heart is normal in size. No pericardial effusion. No evidence of thoracic aortic aneurysm. Atherosclerotic calcifications of the aortic arch. Coronary atherosclerosis of the LAD. Right chest port terminates at the cavoatrial junction. Mediastinum/Nodes: Thoracic lymphadenopathy, improved, including: 8 mm short axis high right paratracheal node (series  2/image 23) 15 mm short axis low right paratracheal node (series 2/image 28), previously 24 mm 13 mm short axis subcarinal node (series 2/image 36), previously 19 mm Visualized thyroid is unremarkable. Lungs/Pleura: Near complete resolution of prior pulmonary/pleural-based metastases in the right hemithorax. Residual 10 x 19 mm subpleural nodular opacity in the posterolateral right middle lobe inferiorly (series 6/image 113), corresponding to the prior 3.7 x 4.0 cm mass. Additional 8 mm subpleural nodular opacity in the posterolateral right middle lobe superiorly (series 6/image 88). Moderate centrilobular and paraseptal emphysematous changes, upper lung predominant. No focal consolidation. No pleural effusion or pneumothorax. Musculoskeletal: Degenerative changes of the thoracic spine. Cervical spine fixation hardware. CT ABDOMEN PELVIS FINDINGS Hepatobiliary: Scattered small right hepatic lobe cysts measuring up to 10 mm (series 2/image 60), unchanged. Status post cholecystectomy. Mild central intrahepatic and extrahepatic ductal dilatation. Pancreas: Within normal limits. Spleen: Within normal limits. Adrenals/Urinary Tract: Adrenal glands are within normal limits. Kidneys are within normal limits.  No hydronephrosis. Thick-walled bladder. Stomach/Bowel: Stomach is within normal limits. No evidence of bowel obstruction. Appendix is not discretely visualized. Vascular/Lymphatic: 4.0 x 4.3 cm suprarenal abdominal aortic aneurysm, grossly unchanged. Atherosclerotic calcifications of the abdominal aorta and branch vessels. No suspicious abdominopelvic lymphadenopathy. Reproductive: Status post hysterectomy. Right ovary is within normal limits. 5.9 x 6.0 cm left ovarian cystic lesion, grossly unchanged. Other: No abdominopelvic ascites. Musculoskeletal: Degenerative changes of the lumbar spine, most prominent at L5-S1. IMPRESSION: Improving pulmonary metastases. Improving thoracic nodal metastases. Stable 4.3 cm  abdominal aortic aneurysm. In this patient with known malignancy, attention on follow-up is suggested. Additional stable ancillary findings as above. Electronically Signed   By: Julian Hy M.D.   On: 02/27/2019 12:42   Ir Imaging Guided Port Insertion  Result Date: 02/24/2019 INDICATION: 73 year old female with stage IV squamous cell cancer of the right lung. She requires durable venous access for chemotherapy and presents for port catheter placement. EXAM: IMPLANTED PORT A CATH PLACEMENT WITH ULTRASOUND AND FLUOROSCOPIC GUIDANCE MEDICATIONS: 2 g Ancef; The antibiotic was administered within an appropriate time interval prior to skin puncture. ANESTHESIA/SEDATION: Versed 3 mg IV; Fentanyl 100 mcg IV; Moderate Sedation Time:  20 minutes The patient was continuously monitored during the procedure by the interventional radiology nurse under my direct supervision. FLUOROSCOPY TIME:  1 minutes, 0 seconds (6 mGy) COMPLICATIONS: None immediate. PROCEDURE: The right neck and chest was prepped with chlorhexidine, and draped in the usual sterile fashion using maximum barrier technique (cap and mask, sterile gown, sterile gloves, large sterile sheet, hand hygiene and cutaneous antiseptic). Local anesthesia was attained by infiltration with 1% lidocaine with epinephrine. Ultrasound demonstrated patency of the right internal jugular vein, and this was documented with an image. Under real-time ultrasound guidance, this vein was accessed with a 21 gauge micropuncture needle and image documentation was performed. A small dermatotomy was made at the access site with an 11 scalpel. A 0.018" wire was advanced into the SVC and the access needle exchanged for a 6F micropuncture vascular sheath. The 0.018" wire was then removed and a 0.035" wire advanced into the IVC. An appropriate location for the subcutaneous reservoir was selected below the clavicle and  an incision was made through the skin and underlying soft tissues. The  subcutaneous tissues were then dissected using a combination of blunt and sharp surgical technique and a pocket was formed. A single lumen power injectable portacatheter was then tunneled through the subcutaneous tissues from the pocket to the dermatotomy and the port reservoir placed within the subcutaneous pocket. The venous access site was then serially dilated and a peel away vascular sheath placed over the wire. The wire was removed and the port catheter advanced into position under fluoroscopic guidance. The catheter tip is positioned in the superior cavoatrial junction. This was documented with a spot image. The portacatheter was then tested and found to flush and aspirate well. The port was flushed with saline followed by 100 units/mL heparinized saline. The pocket was then closed in two layers using first subdermal inverted interrupted absorbable sutures followed by a running subcuticular suture. The epidermis was then sealed with Dermabond. The dermatotomy at the venous access site was also closed with Dermabond. IMPRESSION: Successful placement of a right IJ approach Power Port with ultrasound and fluoroscopic guidance. The catheter is ready for use. Electronically Signed   By: Jacqulynn Cadet M.D.   On: 02/24/2019 17:12     ASSESSMENT/PLAN:  This is a very pleasant 73 year old African-American female diagnosed with stage IV non-small cell lung cancer, squamous cell carcinoma. She presented with a right upper lobe lung mass and pleural based metastases and mediastinal lymphadenopathy.She was diagnosed in August 2020   The patient is currently undergoing systemicchemotherapy with carboplatin for an AUC of 5, paclitaxel 175 mg/m, and Keytruda 200 mg IV every 3 weeks with Neulasta support.She is status post 4 cycles of treatment and she tolerated it wellwithout any concerning adverse effectsexcept for mild fatigue.   The patient recently had a restaging CT scan performed.  Dr. Julien Nordmann  personally and independently reviewed the scan and discussed results with patient today.  The scan did not show any evidence of disease progression.  Dr. Julien Nordmann recommends that she continue with cycle #5 today as scheduled.  We will see the patient back for follow-up visit in 3 weeks for evaluation before starting cycle #6.  The patient was advised to call immediately if she has any concerning symptoms in the interval. The patient voices understanding of current disease status and treatment options and is in agreement with the current care plan. All questions were answered. The patient knows to call the clinic with any problems, questions or concerns. We can certainly see the patient much sooner if necessary   Orders Placed This Encounter  Procedures  . CBC with Differential (Cancer Center Only)    Standing Status:   Standing    Number of Occurrences:   12    Standing Expiration Date:   03/02/2020  . CMP ([facility] only)    Standing Status:   Standing    Number of Occurrences:   12    Standing Expiration Date:   03/02/2020     Tobe Sos Dameian Crisman, PA-C 03/03/19  ADDENDUM: Hematology/Oncology Attending: I had a face-to-face encounter with the patient today.  I recommended her care plan.  This is a very pleasant 74 years old African-American female with stage IV non-small cell lung cancer, squamous cell carcinoma.  The patient has been undergoing treatment with systemic chemotherapy with carboplatin, paclitaxel and Keytruda status post 4 cycles.  She has been tolerating her treatment fairly well with no significant adverse effects. She had repeat CT scan of the chest,  abdomen pelvis performed recently.  I personally and independently reviewed the scans and discussed the results with the patient today. Her scan showed improvement in her disease with decrease in the pulmonary nodules as well as thoracic lymphadenopathy. I recommended for the patient to start the first cycle of  maintenance treatment with single agent Keytruda today. We will see her back for follow-up visit in 3 weeks for evaluation before starting the second cycle of her maintenance treatment. The patient was advised to call immediately if she has any concerning symptoms in the interval.  Disclaimer: This note was dictated with voice recognition software. Similar sounding words can inadvertently be transcribed and may be missed upon review. Eilleen Kempf, MD 03/03/19

## 2019-03-03 NOTE — Progress Notes (Signed)
Nutrition follow-up completed with patient during infusion for non-small cell lung cancer. Weight has improved and was documented as 138.4 pounds December 9 increased from 124.9 pounds October 7. Patient reports she is eating well.  States weight gain is secondary to increasing consumption of "junk food." She has no questions or concerns at this time.  Nutrition diagnosis: Unintended weight loss resolved.  Educated patient to consume healthy plant-based foods with adequate protein. Encouraged her to strive for weight maintenance. Suggested she could discontinue Ensure Enlive as long as she was eating well and maintaining weight.  Patient will tolerate adequate calories and protein for weight maintenance.  Next visit: To be scheduled as needed.

## 2019-03-03 NOTE — Patient Instructions (Signed)
[facility] Discharge Instructions for Patients Receiving Chemotherapy  Today you received the following chemotherapy agents keytruda.  To help prevent nausea and vomiting after your treatment, we encourage you to take your nausea medicatio.   If you develop nausea and vomiting that is not controlled by your nausea medication, call the clinic.   BELOW ARE SYMPTOMS THAT SHOULD BE REPORTED IMMEDIATELY:  *FEVER GREATER THAN 100.5 F  *CHILLS WITH OR WITHOUT FEVER  NAUSEA AND VOMITING THAT IS NOT CONTROLLED WITH YOUR NAUSEA MEDICATION  *UNUSUAL SHORTNESS OF BREATH  *UNUSUAL BRUISING OR BLEEDING  TENDERNESS IN MOUTH AND THROAT WITH OR WITHOUT PRESENCE OF ULCERS  *URINARY PROBLEMS  *BOWEL PROBLEMS  UNUSUAL RASH Items with * indicate a potential emergency and should be followed up as soon as possible.  Feel free to call the clinic should you have any questions or concerns. The clinic phone number is [phone].  Please show the [facility] at check-in to the Emergency Department and triage nurse.

## 2019-03-04 ENCOUNTER — Telehealth: Payer: Self-pay | Admitting: Physician Assistant

## 2019-03-04 NOTE — Telephone Encounter (Signed)
Scheduled per los. Called and left detailed msg about cancelled weekly labs and added appts.

## 2019-03-05 ENCOUNTER — Inpatient Hospital Stay: Payer: 59

## 2019-03-10 ENCOUNTER — Other Ambulatory Visit: Payer: 59

## 2019-03-17 ENCOUNTER — Other Ambulatory Visit: Payer: 59

## 2019-03-24 ENCOUNTER — Inpatient Hospital Stay: Payer: 59

## 2019-03-24 ENCOUNTER — Other Ambulatory Visit: Payer: Self-pay

## 2019-03-24 ENCOUNTER — Encounter: Payer: Self-pay | Admitting: Internal Medicine

## 2019-03-24 ENCOUNTER — Other Ambulatory Visit: Payer: Self-pay | Admitting: Medical Oncology

## 2019-03-24 ENCOUNTER — Inpatient Hospital Stay (HOSPITAL_BASED_OUTPATIENT_CLINIC_OR_DEPARTMENT_OTHER): Payer: 59 | Admitting: Internal Medicine

## 2019-03-24 VITALS — BP 110/65 | HR 67 | Temp 98.3°F | Resp 17 | Ht 61.0 in | Wt 138.8 lb

## 2019-03-24 DIAGNOSIS — I10 Essential (primary) hypertension: Secondary | ICD-10-CM

## 2019-03-24 DIAGNOSIS — C3491 Malignant neoplasm of unspecified part of right bronchus or lung: Secondary | ICD-10-CM

## 2019-03-24 DIAGNOSIS — Z5112 Encounter for antineoplastic immunotherapy: Secondary | ICD-10-CM

## 2019-03-24 DIAGNOSIS — C3411 Malignant neoplasm of upper lobe, right bronchus or lung: Secondary | ICD-10-CM | POA: Diagnosis not present

## 2019-03-24 LAB — CBC WITH DIFFERENTIAL (CANCER CENTER ONLY)
Abs Immature Granulocytes: 0 10*3/uL (ref 0.00–0.07)
Basophils Absolute: 0.1 10*3/uL (ref 0.0–0.1)
Basophils Relative: 2 %
Eosinophils Absolute: 0.5 10*3/uL (ref 0.0–0.5)
Eosinophils Relative: 15 %
HCT: 32.1 % — ABNORMAL LOW (ref 36.0–46.0)
Hemoglobin: 10.5 g/dL — ABNORMAL LOW (ref 12.0–15.0)
Immature Granulocytes: 0 %
Lymphocytes Relative: 36 %
Lymphs Abs: 1.2 10*3/uL (ref 0.7–4.0)
MCH: 33.4 pg (ref 26.0–34.0)
MCHC: 32.7 g/dL (ref 30.0–36.0)
MCV: 102.2 fL — ABNORMAL HIGH (ref 80.0–100.0)
Monocytes Absolute: 0.3 10*3/uL (ref 0.1–1.0)
Monocytes Relative: 11 %
Neutro Abs: 1.2 10*3/uL — ABNORMAL LOW (ref 1.7–7.7)
Neutrophils Relative %: 36 %
Platelet Count: 186 10*3/uL (ref 150–400)
RBC: 3.14 MIL/uL — ABNORMAL LOW (ref 3.87–5.11)
RDW: 15.6 % — ABNORMAL HIGH (ref 11.5–15.5)
WBC Count: 3.2 10*3/uL — ABNORMAL LOW (ref 4.0–10.5)
nRBC: 0 % (ref 0.0–0.2)

## 2019-03-24 LAB — CMP (CANCER CENTER ONLY)
ALT: 7 U/L (ref 0–44)
AST: 12 U/L — ABNORMAL LOW (ref 15–41)
Albumin: 4.1 g/dL (ref 3.5–5.0)
Alkaline Phosphatase: 63 U/L (ref 38–126)
Anion gap: 10 (ref 5–15)
BUN: 27 mg/dL — ABNORMAL HIGH (ref 8–23)
CO2: 25 mmol/L (ref 22–32)
Calcium: 9.3 mg/dL (ref 8.9–10.3)
Chloride: 102 mmol/L (ref 98–111)
Creatinine: 0.97 mg/dL (ref 0.44–1.00)
GFR, Est AFR Am: >60 mL/min (ref >60)
GFR, Estimated: 58 mL/min — ABNORMAL LOW (ref >60)
Glucose, Bld: 97 mg/dL (ref 70–99)
Potassium: 3.5 mmol/L (ref 3.5–5.1)
Sodium: 137 mmol/L (ref 135–145)
Total Bilirubin: 0.7 mg/dL (ref 0.3–1.2)
Total Protein: 7.6 g/dL (ref 6.5–8.1)

## 2019-03-24 LAB — TSH: TSH: 1.693 u[IU]/mL (ref 0.308–3.960)

## 2019-03-24 MED ORDER — HEPARIN SOD (PORK) LOCK FLUSH 100 UNIT/ML IV SOLN
500.0000 [IU] | Freq: Once | INTRAVENOUS | Status: AC | PRN
  Administered 2019-03-24: 500 [IU]
  Filled 2019-03-24: qty 5

## 2019-03-24 MED ORDER — SODIUM CHLORIDE 0.9% FLUSH
10.0000 mL | INTRAVENOUS | Status: DC | PRN
  Administered 2019-03-24: 10 mL
  Filled 2019-03-24: qty 10

## 2019-03-24 MED ORDER — SODIUM CHLORIDE 0.9 % IV SOLN
200.0000 mg | Freq: Once | INTRAVENOUS | Status: AC
  Administered 2019-03-24: 200 mg via INTRAVENOUS
  Filled 2019-03-24: qty 8

## 2019-03-24 MED ORDER — SODIUM CHLORIDE 0.9 % IV SOLN
Freq: Once | INTRAVENOUS | Status: AC
  Filled 2019-03-24: qty 250

## 2019-03-24 NOTE — Patient Instructions (Signed)
EMLA CREAM - apply one hour before we will be using your port, it can remain on for 1 -2 hours but no more than that. Apply generously over the site and DO NOT rub in, gently cover it.  [facility] Discharge Instructions for Patients Receiving Chemotherapy  Today you received the following chemotherapy agents Keytruda. To help prevent nausea and vomiting after your treatment, we encourage you to take your nausea medication as directed.   If you develop nausea and vomiting that is not controlled by your nausea medication, call the clinic.   BELOW ARE SYMPTOMS THAT SHOULD BE REPORTED IMMEDIATELY:  *FEVER GREATER THAN 100.5 F  *CHILLS WITH OR WITHOUT FEVER  NAUSEA AND VOMITING THAT IS NOT CONTROLLED WITH YOUR NAUSEA MEDICATION  *UNUSUAL SHORTNESS OF BREATH  *UNUSUAL BRUISING OR BLEEDING  TENDERNESS IN MOUTH AND THROAT WITH OR WITHOUT PRESENCE OF ULCERS  *URINARY PROBLEMS  *BOWEL PROBLEMS  UNUSUAL RASH Items with * indicate a potential emergency and should be followed up as soon as possible.  Feel free to call the clinic you have any questions or concerns. The clinic phone number is [phone].  Please show the [facility] at check-in to the Emergency Department and triage nurse.

## 2019-03-24 NOTE — Progress Notes (Signed)
[place] Telephone:(336) [phone]   [phone]  OFFICE PROGRESS NOTE  [name], MD [place] [address]  DIAGNOSIS: Stage IV non-small cell lung cancer, squamous cell carcinoma. She presented withright upper lobe lung mass in addition to pleural-based metastasis andmediastinal lymphadenopathy.She was diagnosed in September 2020.  PRIOR THERAPY: None  CURRENT THERAPY: Chemotherapy with carboplatin for an AUC of 5,paclitaxel 175 mg/m, and Keytruda 200 mg IV every 3 weeks with Neulasta support. Firsttreatmenton 12/09/2018.Status post5cycles  INTERVAL HISTORY: Debra Rodgers 73 y.o. female returns to the clinic today for follow-up visit.  The patient is feeling fine today with no concerning complaints except for mild fatigue.  She tolerated the first dose of her maintenance treatment with single agent Keytruda fairly well.  She denied having any current chest pain, shortness of breath, cough or hemoptysis.  She denied having any fever or chills.  She has no nausea, vomiting, diarrhea or constipation.  She has no headache or visual changes.  She is here today for evaluation before starting cycle #6 of her treatment.  MEDICAL HISTORY: Past Medical History:  Diagnosis Date   COPD (chronic obstructive pulmonary disease) ([place])    Depression    Essential hypertension    Headache    History of migraine headaches    lung ca dx'd 10/2018   Sleep apnea    Tobacco use disorder     ALLERGIES:  is allergic to codeine sulfate and pantoprazole sodium.  MEDICATIONS:  Current Outpatient Medications  Medication Sig Dispense Refill   acetaminophen (TYLENOL) 325 MG tablet Take 2 tablets (650 mg total) by mouth every 6 (six) hours as needed. 30 tablet 0   amLODipine (NORVASC) 10 MG tablet Take 1 tablet (10 mg total) by mouth daily. 90 tablet 1   aspirin 81 MG EC tablet Take 81 mg by mouth daily.       atorvastatin  (LIPITOR) 20 MG tablet TAKE 1 TABLET BY MOUTH EVERY DAY 90 tablet 3   chlorthalidone (HYGROTON) 50 MG tablet TAKE 1 TABLET BY MOUTH EVERY DAY 90 tablet 1   famotidine (PEPCID) 20 MG tablet Take 20 mg by mouth daily.      FLUoxetine (PROZAC) 20 MG capsule Take 1 capsule (20 mg total) by mouth daily. 30 capsule 5   gabapentin (NEURONTIN) 100 MG capsule Take 1 capsule (100 mg total) by mouth 2 (two) times daily. 180 capsule 3   HYDROcodone-acetaminophen (NORCO) 7.5-325 MG tablet Take 1 tablet by mouth every 6 (six) hours as needed for moderate pain. 60 tablet 0   lidocaine-prilocaine (EMLA) cream Apply 1 application topically as needed. 30 g 0   lisinopril (ZESTRIL) 10 MG tablet TAKE 1 TABLET BY MOUTH EVERY DAY 90 tablet 1   nicotine ([employer] NICOTINE TRANSDERMAL SYS) 14 mg/24hr patch Place 1 patch (14 mg total) onto the skin daily. (Patient not taking: Reported on 03/03/2019) 42 patch 0   prochlorperazine (COMPAZINE) 10 MG tablet Take 1 tablet (10 mg total) by mouth every 6 (six) hours as needed for nausea or vomiting. 30 tablet 2   Tiotropium Bromide-Olodaterol (STIOLTO RESPIMAT) 2.5-2.5 MCG/ACT AERS Inhale 2 puffs into the lungs daily. 4 g 5   No current facility-administered medications for this visit.    SURGICAL HISTORY:  Past Surgical History:  Procedure Laterality Date   ABDOMINAL HYSTERECTOMY     CHOLECYSTECTOMY     IR IMAGING GUIDED PORT INSERTION  02/24/2019   ROTATOR  CUFF REPAIR  3/04   VIDEO BRONCHOSCOPY WITH ENDOBRONCHIAL ULTRASOUND Right 11/25/2018   Procedure: VIDEO BRONCHOSCOPY WITH ENDOBRONCHIAL ULTRASOUND;  Surgeon: Collene Gobble, MD;  Location: [facility] OR;  Service: Thoracic;  Laterality: Right;    REVIEW OF SYSTEMS:  A comprehensive review of systems was negative except for: Constitutional: positive for fatigue   PHYSICAL EXAMINATION: General appearance: alert, cooperative, fatigued and no distress Head: Normocephalic, without obvious abnormality, atraumatic Neck:  no adenopathy, no JVD, supple, symmetrical, trachea midline and thyroid not enlarged, symmetric, no tenderness/mass/nodules Lymph nodes: Cervical, supraclavicular, and axillary nodes normal. Resp: clear to auscultation bilaterally Back: symmetric, no curvature. ROM normal. No CVA tenderness. Cardio: regular rate and rhythm, S1, S2 normal, no murmur, click, rub or gallop GI: soft, non-tender; bowel sounds normal; no masses,  no organomegaly Extremities: extremities normal, atraumatic, no cyanosis or edema  ECOG PERFORMANCE STATUS: 1 - Symptomatic but completely ambulatory  Blood pressure 110/65, pulse 67, temperature 98.3 F (36.8 C), temperature source Temporal, resp. rate 17, height 5' 1"  (1.549 m), weight 138 lb 12.8 oz (63 kg), SpO2 99 %.  LABORATORY DATA: Lab Results  Component Value Date   WBC 3.2 (L) 03/24/2019   HGB 10.5 (L) 03/24/2019   HCT 32.1 (L) 03/24/2019   MCV 102.2 (H) 03/24/2019   PLT 186 03/24/2019      Chemistry      Component Value Date/Time   NA 137 03/24/2019 1023   NA 139 07/21/2017 0958   K 3.5 03/24/2019 1023   CL 102 03/24/2019 1023   CO2 25 03/24/2019 1023   BUN 27 (H) 03/24/2019 1023   BUN 26 07/21/2017 0958   CREATININE 0.97 03/24/2019 1023   CREATININE 0.79 09/03/2013 1540      Component Value Date/Time   CALCIUM 9.3 03/24/2019 1023   ALKPHOS 63 03/24/2019 1023   AST 12 (L) 03/24/2019 1023   ALT 7 03/24/2019 1023   BILITOT 0.7 03/24/2019 1023       RADIOGRAPHIC STUDIES: CT Chest W Contrast  Result Date: 02/27/2019 CLINICAL DATA:  Stage IV squamous cell lung cancer, chemotherapy/immunotherapy ongoing EXAM: CT CHEST, ABDOMEN, AND PELVIS WITH CONTRAST TECHNIQUE: Multidetector CT imaging of the chest, abdomen and pelvis was performed following the standard protocol during bolus administration of intravenous contrast. CONTRAST:  126m OMNIPAQUE IOHEXOL 300 MG/ML  SOLN COMPARISON:  PET-CT dated 11/18/2018. CT chest dated 11/07/2018. CT  abdomen/pelvis dated 11/05/2018. FINDINGS: CT CHEST FINDINGS Cardiovascular: The heart is normal in size. No pericardial effusion. No evidence of thoracic aortic aneurysm. Atherosclerotic calcifications of the aortic arch. Coronary atherosclerosis of the LAD. Right chest port terminates at the cavoatrial junction. Mediastinum/Nodes: Thoracic lymphadenopathy, improved, including: 8 mm short axis high right paratracheal node (series 2/image 23) 15 mm short axis low right paratracheal node (series 2/image 28), previously 24 mm 13 mm short axis subcarinal node (series 2/image 36), previously 19 mm Visualized thyroid is unremarkable. Lungs/Pleura: Near complete resolution of prior pulmonary/pleural-based metastases in the right hemithorax. Residual 10 x 19 mm subpleural nodular opacity in the posterolateral right middle lobe inferiorly (series 6/image 113), corresponding to the prior 3.7 x 4.0 cm mass. Additional 8 mm subpleural nodular opacity in the posterolateral right middle lobe superiorly (series 6/image 88). Moderate centrilobular and paraseptal emphysematous changes, upper lung predominant. No focal consolidation. No pleural effusion or pneumothorax. Musculoskeletal: Degenerative changes of the thoracic spine. Cervical spine fixation hardware. CT ABDOMEN PELVIS FINDINGS Hepatobiliary: Scattered small right hepatic lobe cysts measuring up to 10 mm (series  2/image 60), unchanged. Status post cholecystectomy. Mild central intrahepatic and extrahepatic ductal dilatation. Pancreas: Within normal limits. Spleen: Within normal limits. Adrenals/Urinary Tract: Adrenal glands are within normal limits. Kidneys are within normal limits.  No hydronephrosis. Thick-walled bladder. Stomach/Bowel: Stomach is within normal limits. No evidence of bowel obstruction. Appendix is not discretely visualized. Vascular/Lymphatic: 4.0 x 4.3 cm suprarenal abdominal aortic aneurysm, grossly unchanged. Atherosclerotic calcifications of the  abdominal aorta and branch vessels. No suspicious abdominopelvic lymphadenopathy. Reproductive: Status post hysterectomy. Right ovary is within normal limits. 5.9 x 6.0 cm left ovarian cystic lesion, grossly unchanged. Other: No abdominopelvic ascites. Musculoskeletal: Degenerative changes of the lumbar spine, most prominent at L5-S1. IMPRESSION: Improving pulmonary metastases. Improving thoracic nodal metastases. Stable 4.3 cm abdominal aortic aneurysm. In this patient with known malignancy, attention on follow-up is suggested. Additional stable ancillary findings as above. Electronically Signed   By: Julian Hy M.D.   On: 02/27/2019 12:42   CT Abdomen Pelvis W Contrast  Result Date: 02/27/2019 CLINICAL DATA:  Stage IV squamous cell lung cancer, chemotherapy/immunotherapy ongoing EXAM: CT CHEST, ABDOMEN, AND PELVIS WITH CONTRAST TECHNIQUE: Multidetector CT imaging of the chest, abdomen and pelvis was performed following the standard protocol during bolus administration of intravenous contrast. CONTRAST:  133m OMNIPAQUE IOHEXOL 300 MG/ML  SOLN COMPARISON:  PET-CT dated 11/18/2018. CT chest dated 11/07/2018. CT abdomen/pelvis dated 11/05/2018. FINDINGS: CT CHEST FINDINGS Cardiovascular: The heart is normal in size. No pericardial effusion. No evidence of thoracic aortic aneurysm. Atherosclerotic calcifications of the aortic arch. Coronary atherosclerosis of the LAD. Right chest port terminates at the cavoatrial junction. Mediastinum/Nodes: Thoracic lymphadenopathy, improved, including: 8 mm short axis high right paratracheal node (series 2/image 23) 15 mm short axis low right paratracheal node (series 2/image 28), previously 24 mm 13 mm short axis subcarinal node (series 2/image 36), previously 19 mm Visualized thyroid is unremarkable. Lungs/Pleura: Near complete resolution of prior pulmonary/pleural-based metastases in the right hemithorax. Residual 10 x 19 mm subpleural nodular opacity in the  posterolateral right middle lobe inferiorly (series 6/image 113), corresponding to the prior 3.7 x 4.0 cm mass. Additional 8 mm subpleural nodular opacity in the posterolateral right middle lobe superiorly (series 6/image 88). Moderate centrilobular and paraseptal emphysematous changes, upper lung predominant. No focal consolidation. No pleural effusion or pneumothorax. Musculoskeletal: Degenerative changes of the thoracic spine. Cervical spine fixation hardware. CT ABDOMEN PELVIS FINDINGS Hepatobiliary: Scattered small right hepatic lobe cysts measuring up to 10 mm (series 2/image 60), unchanged. Status post cholecystectomy. Mild central intrahepatic and extrahepatic ductal dilatation. Pancreas: Within normal limits. Spleen: Within normal limits. Adrenals/Urinary Tract: Adrenal glands are within normal limits. Kidneys are within normal limits.  No hydronephrosis. Thick-walled bladder. Stomach/Bowel: Stomach is within normal limits. No evidence of bowel obstruction. Appendix is not discretely visualized. Vascular/Lymphatic: 4.0 x 4.3 cm suprarenal abdominal aortic aneurysm, grossly unchanged. Atherosclerotic calcifications of the abdominal aorta and branch vessels. No suspicious abdominopelvic lymphadenopathy. Reproductive: Status post hysterectomy. Right ovary is within normal limits. 5.9 x 6.0 cm left ovarian cystic lesion, grossly unchanged. Other: No abdominopelvic ascites. Musculoskeletal: Degenerative changes of the lumbar spine, most prominent at L5-S1. IMPRESSION: Improving pulmonary metastases. Improving thoracic nodal metastases. Stable 4.3 cm abdominal aortic aneurysm. In this patient with known malignancy, attention on follow-up is suggested. Additional stable ancillary findings as above. Electronically Signed   By: SJulian HyM.D.   On: 02/27/2019 12:42   IR IMAGING GUIDED PORT INSERTION  Result Date: 02/24/2019 INDICATION: 73year old female with stage IV squamous cell cancer of  the right  lung. She requires durable venous access for chemotherapy and presents for port catheter placement. EXAM: IMPLANTED PORT A CATH PLACEMENT WITH ULTRASOUND AND FLUOROSCOPIC GUIDANCE MEDICATIONS: 2 g Ancef; The antibiotic was administered within an appropriate time interval prior to skin puncture. ANESTHESIA/SEDATION: Versed 3 mg IV; Fentanyl 100 mcg IV; Moderate Sedation Time:  20 minutes The patient was continuously monitored during the procedure by the interventional radiology nurse under my direct supervision. FLUOROSCOPY TIME:  1 minutes, 0 seconds (6 mGy) COMPLICATIONS: None immediate. PROCEDURE: The right neck and chest was prepped with chlorhexidine, and draped in the usual sterile fashion using maximum barrier technique (cap and mask, sterile gown, sterile gloves, large sterile sheet, hand hygiene and cutaneous antiseptic). Local anesthesia was attained by infiltration with 1% lidocaine with epinephrine. Ultrasound demonstrated patency of the right internal jugular vein, and this was documented with an image. Under real-time ultrasound guidance, this vein was accessed with a 21 gauge micropuncture needle and image documentation was performed. A small dermatotomy was made at the access site with an 11 scalpel. A 0.018" wire was advanced into the SVC and the access needle exchanged for a 42F micropuncture vascular sheath. The 0.018" wire was then removed and a 0.035" wire advanced into the IVC. An appropriate location for the subcutaneous reservoir was selected below the clavicle and an incision was made through the skin and underlying soft tissues. The subcutaneous tissues were then dissected using a combination of blunt and sharp surgical technique and a pocket was formed. A single lumen power injectable portacatheter was then tunneled through the subcutaneous tissues from the pocket to the dermatotomy and the port reservoir placed within the subcutaneous pocket. The venous access site was then serially dilated  and a peel away vascular sheath placed over the wire. The wire was removed and the port catheter advanced into position under fluoroscopic guidance. The catheter tip is positioned in the superior cavoatrial junction. This was documented with a spot image. The portacatheter was then tested and found to flush and aspirate well. The port was flushed with saline followed by 100 units/mL heparinized saline. The pocket was then closed in two layers using first subdermal inverted interrupted absorbable sutures followed by a running subcuticular suture. The epidermis was then sealed with Dermabond. The dermatotomy at the venous access site was also closed with Dermabond. IMPRESSION: Successful placement of a right IJ approach Power Port with ultrasound and fluoroscopic guidance. The catheter is ready for use. Electronically Signed   By: Jacqulynn Cadet M.D.   On: 02/24/2019 17:12    ASSESSMENT AND PLAN: This is a very pleasant 73 years old African-American female with stage IV non-small cell carcinoma,, squamous cell carcinoma diagnosed in September 2020.  She presented with extensive right-sided pleural and thoracic nodal hypermetabolic disease with no extrathoracic disease. The patient started induction treatment with systemic chemotherapy with carboplatin, paclitaxel and Keytruda status post 4 cycles with partial response after cycle #4.  She is currently undergoing maintenance treatment with single agent Keytruda status post 1 cycle and tolerated the first cycle of her maintenance treatment fairly well. I recommended for the patient to proceed with cycle #6 today as planned. I will see her back for follow-up visit in 3 weeks for evaluation before the next cycle of her treatment. The patient was advised to call immediately if she has any concerning symptoms in the interval. The patient voices understanding of current disease status and treatment options and is in agreement with the current care  plan.  All  questions were answered. The patient knows to call the clinic with any problems, questions or concerns. We can certainly see the patient much sooner if necessary.  Disclaimer: This note was dictated with voice recognition software. Similar sounding words can inadvertently be transcribed and may not be corrected upon review.

## 2019-03-24 NOTE — Progress Notes (Signed)
Ok to treat with labs [facility]) today per MD [facility]

## 2019-03-25 ENCOUNTER — Telehealth: Payer: Self-pay | Admitting: Internal Medicine

## 2019-03-25 NOTE — Telephone Encounter (Signed)
Scheduled per los. Called and left msg. Mailed printout  °

## 2019-04-06 ENCOUNTER — Encounter: Payer: Self-pay | Admitting: *Deleted

## 2019-04-14 ENCOUNTER — Inpatient Hospital Stay: Payer: Medicare Other | Attending: Physician Assistant | Admitting: Internal Medicine

## 2019-04-14 ENCOUNTER — Other Ambulatory Visit: Payer: Self-pay

## 2019-04-14 ENCOUNTER — Inpatient Hospital Stay: Payer: Medicare Other | Admitting: Nutrition

## 2019-04-14 ENCOUNTER — Inpatient Hospital Stay: Payer: Medicare Other

## 2019-04-14 ENCOUNTER — Encounter: Payer: Self-pay | Admitting: Internal Medicine

## 2019-04-14 VITALS — BP 113/70 | HR 65 | Temp 97.5°F | Resp 17 | Ht 61.0 in | Wt 136.4 lb

## 2019-04-14 DIAGNOSIS — F329 Major depressive disorder, single episode, unspecified: Secondary | ICD-10-CM | POA: Diagnosis not present

## 2019-04-14 DIAGNOSIS — F17210 Nicotine dependence, cigarettes, uncomplicated: Secondary | ICD-10-CM | POA: Diagnosis not present

## 2019-04-14 DIAGNOSIS — C3411 Malignant neoplasm of upper lobe, right bronchus or lung: Secondary | ICD-10-CM | POA: Insufficient documentation

## 2019-04-14 DIAGNOSIS — J449 Chronic obstructive pulmonary disease, unspecified: Secondary | ICD-10-CM | POA: Diagnosis not present

## 2019-04-14 DIAGNOSIS — I10 Essential (primary) hypertension: Secondary | ICD-10-CM | POA: Insufficient documentation

## 2019-04-14 DIAGNOSIS — Z79899 Other long term (current) drug therapy: Secondary | ICD-10-CM | POA: Insufficient documentation

## 2019-04-14 DIAGNOSIS — G4730 Sleep apnea, unspecified: Secondary | ICD-10-CM | POA: Diagnosis not present

## 2019-04-14 DIAGNOSIS — Z95828 Presence of other vascular implants and grafts: Secondary | ICD-10-CM

## 2019-04-14 DIAGNOSIS — Z9221 Personal history of antineoplastic chemotherapy: Secondary | ICD-10-CM | POA: Insufficient documentation

## 2019-04-14 DIAGNOSIS — C3491 Malignant neoplasm of unspecified part of right bronchus or lung: Secondary | ICD-10-CM

## 2019-04-14 DIAGNOSIS — Z5112 Encounter for antineoplastic immunotherapy: Secondary | ICD-10-CM

## 2019-04-14 DIAGNOSIS — C782 Secondary malignant neoplasm of pleura: Secondary | ICD-10-CM | POA: Diagnosis not present

## 2019-04-14 LAB — CMP (CANCER CENTER ONLY)
ALT: 9 U/L (ref 0–44)
AST: 14 U/L — ABNORMAL LOW (ref 15–41)
Albumin: 4.2 g/dL (ref 3.5–5.0)
Alkaline Phosphatase: 63 U/L (ref 38–126)
Anion gap: 8 (ref 5–15)
BUN: 33 mg/dL — ABNORMAL HIGH (ref 8–23)
CO2: 27 mmol/L (ref 22–32)
Calcium: 8.9 mg/dL (ref 8.9–10.3)
Chloride: 102 mmol/L (ref 98–111)
Creatinine: 1 mg/dL (ref 0.44–1.00)
GFR, Est AFR Am: >60 mL/min (ref >60)
GFR, Estimated: 56 mL/min — ABNORMAL LOW (ref >60)
Glucose, Bld: 88 mg/dL (ref 70–99)
Potassium: 3.5 mmol/L (ref 3.5–5.1)
Sodium: 137 mmol/L (ref 135–145)
Total Bilirubin: 0.6 mg/dL (ref 0.3–1.2)
Total Protein: 7.8 g/dL (ref 6.5–8.1)

## 2019-04-14 LAB — CBC WITH DIFFERENTIAL (CANCER CENTER ONLY)
Abs Immature Granulocytes: 0.01 10*3/uL (ref 0.00–0.07)
Basophils Absolute: 0 10*3/uL (ref 0.0–0.1)
Basophils Relative: 1 %
Eosinophils Absolute: 0.3 10*3/uL (ref 0.0–0.5)
Eosinophils Relative: 10 %
HCT: 35.5 % — ABNORMAL LOW (ref 36.0–46.0)
Hemoglobin: 11.9 g/dL — ABNORMAL LOW (ref 12.0–15.0)
Immature Granulocytes: 0 %
Lymphocytes Relative: 43 %
Lymphs Abs: 1.3 10*3/uL (ref 0.7–4.0)
MCH: 33.9 pg (ref 26.0–34.0)
MCHC: 33.5 g/dL (ref 30.0–36.0)
MCV: 101.1 fL — ABNORMAL HIGH (ref 80.0–100.0)
Monocytes Absolute: 0.4 10*3/uL (ref 0.1–1.0)
Monocytes Relative: 12 %
Neutro Abs: 1 10*3/uL — ABNORMAL LOW (ref 1.7–7.7)
Neutrophils Relative %: 34 %
Platelet Count: 206 10*3/uL (ref 150–400)
RBC: 3.51 MIL/uL — ABNORMAL LOW (ref 3.87–5.11)
RDW: 13.3 % (ref 11.5–15.5)
WBC Count: 3 10*3/uL — ABNORMAL LOW (ref 4.0–10.5)
nRBC: 0 % (ref 0.0–0.2)

## 2019-04-14 LAB — TSH: TSH: 1.883 u[IU]/mL (ref 0.308–3.960)

## 2019-04-14 MED ORDER — HEPARIN SOD (PORK) LOCK FLUSH 100 UNIT/ML IV SOLN
500.0000 [IU] | Freq: Once | INTRAVENOUS | Status: AC | PRN
  Administered 2019-04-14: 500 [IU]
  Filled 2019-04-14: qty 5

## 2019-04-14 MED ORDER — SODIUM CHLORIDE 0.9% FLUSH
10.0000 mL | INTRAVENOUS | Status: DC | PRN
  Administered 2019-04-14: 10 mL
  Filled 2019-04-14: qty 10

## 2019-04-14 MED ORDER — SODIUM CHLORIDE 0.9 % IV SOLN
200.0000 mg | Freq: Once | INTRAVENOUS | Status: AC
  Administered 2019-04-14: 11:00:00 200 mg via INTRAVENOUS
  Filled 2019-04-14: qty 8

## 2019-04-14 MED ORDER — SODIUM CHLORIDE 0.9 % IV SOLN
Freq: Once | INTRAVENOUS | Status: AC
  Filled 2019-04-14: qty 250

## 2019-04-14 MED ORDER — SODIUM CHLORIDE 0.9% FLUSH
10.0000 mL | INTRAVENOUS | Status: DC | PRN
  Administered 2019-04-14: 10 mL via INTRAVENOUS
  Filled 2019-04-14: qty 10

## 2019-04-14 NOTE — Progress Notes (Signed)
[place] Telephone:(336) [phone]   Fax:(336) [phone]  OFFICE PROGRESS NOTE  [name], MD [place] [address]  DIAGNOSIS: Stage IV non-small cell lung cancer, squamous cell carcinoma. She presented withright upper lobe lung mass in addition to pleural-based metastasis andmediastinal lymphadenopathy.She was diagnosed in September 2020.  PRIOR THERAPY: None  CURRENT THERAPY: Chemotherapy with carboplatin for an AUC of 5,paclitaxel 175 mg/m, and Keytruda 200 mg IV every 3 weeks with Neulasta support. Firsttreatmenton 12/09/2018.Status post6cycles.  Starting from cycle #5 she is currently on maintenance treatment with single agent Keytruda every 3 weeks.  INTERVAL HISTORY: Debra Rodgers 74 y.o. female returns to the clinic today for follow-up visit.  The patient is feeling fine today with no concerning complaints.  She is tolerating her current treatment with maintenance Keytruda fairly well.  She denied having any current chest pain, shortness of breath, cough or hemoptysis.  She denied having any fever or chills.  She has no nausea, vomiting, diarrhea or constipation.  The patient denied having any headache or visual changes.  She is here today for evaluation before starting cycle #7 of her treatment.  MEDICAL HISTORY: Past Medical History:  Diagnosis Date  . COPD (chronic obstructive pulmonary disease) ([place])   . Depression   . Essential hypertension   . Headache   . History of migraine headaches   . lung ca dx'd 10/2018  . Sleep apnea   . Tobacco use disorder     ALLERGIES:  is allergic to codeine sulfate and pantoprazole sodium.  MEDICATIONS:  Current Outpatient Medications  Medication Sig Dispense Refill  . acetaminophen (TYLENOL) 325 MG tablet Take 2 tablets (650 mg total) by mouth every 6 (six) hours as needed. 30 tablet 0  . amLODipine (NORVASC) 10 MG tablet Take 1 tablet (10 mg total) by mouth daily. 90  tablet 1  . aspirin 81 MG EC tablet Take 81 mg by mouth daily.      Marland Kitchen atorvastatin (LIPITOR) 20 MG tablet TAKE 1 TABLET BY MOUTH EVERY DAY 90 tablet 3  . chlorthalidone (HYGROTON) 50 MG tablet TAKE 1 TABLET BY MOUTH EVERY DAY 90 tablet 1  . famotidine (PEPCID) 20 MG tablet Take 20 mg by mouth daily.     Marland Kitchen FLUoxetine (PROZAC) 20 MG capsule Take 1 capsule (20 mg total) by mouth daily. 30 capsule 5  . gabapentin (NEURONTIN) 100 MG capsule Take 1 capsule (100 mg total) by mouth 2 (two) times daily. 180 capsule 3  . HYDROcodone-acetaminophen (NORCO) 7.5-325 MG tablet Take 1 tablet by mouth every 6 (six) hours as needed for moderate pain. 60 tablet 0  . lidocaine-prilocaine (EMLA) cream Apply 1 application topically as needed. 30 g 0  . lisinopril (ZESTRIL) 10 MG tablet TAKE 1 TABLET BY MOUTH EVERY DAY 90 tablet 1  . nicotine ([employer] NICOTINE TRANSDERMAL SYS) 14 mg/24hr patch Place 1 patch (14 mg total) onto the skin daily. (Patient not taking: Reported on 03/03/2019) 42 patch 0  . prochlorperazine (COMPAZINE) 10 MG tablet Take 1 tablet (10 mg total) by mouth every 6 (six) hours as needed for nausea or vomiting. 30 tablet 2  . Tiotropium Bromide-Olodaterol (STIOLTO RESPIMAT) 2.5-2.5 MCG/ACT AERS Inhale 2 puffs into the lungs daily. 4 g 5   No current facility-administered medications for this visit.    SURGICAL HISTORY:  Past Surgical History:  Procedure Laterality Date  . ABDOMINAL HYSTERECTOMY    . CHOLECYSTECTOMY    .  IR IMAGING GUIDED PORT INSERTION  02/24/2019  . ROTATOR CUFF REPAIR  3/04  . VIDEO BRONCHOSCOPY WITH ENDOBRONCHIAL ULTRASOUND Right 11/25/2018   Procedure: VIDEO BRONCHOSCOPY WITH ENDOBRONCHIAL ULTRASOUND;  Surgeon: Collene Gobble, MD;  Location: [facility] OR;  Service: Thoracic;  Laterality: Right;    REVIEW OF SYSTEMS:  A comprehensive review of systems was negative.   PHYSICAL EXAMINATION: General appearance: alert, cooperative, fatigued and no distress Head: Normocephalic, without  obvious abnormality, atraumatic Neck: no adenopathy, no JVD, supple, symmetrical, trachea midline and thyroid not enlarged, symmetric, no tenderness/mass/nodules Lymph nodes: Cervical, supraclavicular, and axillary nodes normal. Resp: clear to auscultation bilaterally Back: symmetric, no curvature. ROM normal. No CVA tenderness. Cardio: regular rate and rhythm, S1, S2 normal, no murmur, click, rub or gallop GI: soft, non-tender; bowel sounds normal; no masses,  no organomegaly Extremities: extremities normal, atraumatic, no cyanosis or edema  ECOG PERFORMANCE STATUS: 1 - Symptomatic but completely ambulatory  Blood pressure 113/70, pulse 65, temperature (!) 97.5 F (36.4 C), temperature source Oral, resp. rate 17, height 5' 1"  (1.549 m), weight 136 lb 6.4 oz (61.9 kg), SpO2 100 %.  LABORATORY DATA: Lab Results  Component Value Date   WBC 3.0 (L) 04/14/2019   HGB 11.9 (L) 04/14/2019   HCT 35.5 (L) 04/14/2019   MCV 101.1 (H) 04/14/2019   PLT 206 04/14/2019      Chemistry      Component Value Date/Time   NA 137 03/24/2019 1023   NA 139 07/21/2017 0958   K 3.5 03/24/2019 1023   CL 102 03/24/2019 1023   CO2 25 03/24/2019 1023   BUN 27 (H) 03/24/2019 1023   BUN 26 07/21/2017 0958   CREATININE 0.97 03/24/2019 1023   CREATININE 0.79 09/03/2013 1540      Component Value Date/Time   CALCIUM 9.3 03/24/2019 1023   ALKPHOS 63 03/24/2019 1023   AST 12 (L) 03/24/2019 1023   ALT 7 03/24/2019 1023   BILITOT 0.7 03/24/2019 1023       RADIOGRAPHIC STUDIES: No results found.  ASSESSMENT AND PLAN: This is a very pleasant 74 years old African-American female with stage IV non-small cell carcinoma,, squamous cell carcinoma diagnosed in September 2020.  She presented with extensive right-sided pleural and thoracic nodal hypermetabolic disease with no extrathoracic disease. The patient started induction treatment with systemic chemotherapy with carboplatin, paclitaxel and Keytruda status  post 4 cycles with partial response after cycle #4.  She is currently undergoing maintenance treatment with single agent Keytruda status post 2 cycles.  The patient has been tolerating this treatment well with no concerning adverse effects. I recommended for her to proceed with cycle #3 of the maintenance therapy with single agent Keytruda as planned. She will come back for follow-up visit in 3 weeks for evaluation before the next cycle of her treatment. She was advised to call immediately if she has any concerning symptoms in the interval. The patient voices understanding of current disease status and treatment options and is in agreement with the current care plan.  All questions were answered. The patient knows to call the clinic with any problems, questions or concerns. We can certainly see the patient much sooner if necessary.  Disclaimer: This note was dictated with voice recognition software. Similar sounding words can inadvertently be transcribed and may not be corrected upon review.

## 2019-04-14 NOTE — Patient Instructions (Signed)
EMLA CREAM - apply one hour before we will be using your port, it can remain on for 1 -2 hours but no more than that. Apply generously over the site and DO NOT rub in, gently cover it.  [facility] Discharge Instructions for Patients Receiving Chemotherapy  Today you received the following chemotherapy agents Keytruda. To help prevent nausea and vomiting after your treatment, we encourage you to take your nausea medication as directed.   If you develop nausea and vomiting that is not controlled by your nausea medication, call the clinic.   BELOW ARE SYMPTOMS THAT SHOULD BE REPORTED IMMEDIATELY:  *FEVER GREATER THAN 100.5 F  *CHILLS WITH OR WITHOUT FEVER  NAUSEA AND VOMITING THAT IS NOT CONTROLLED WITH YOUR NAUSEA MEDICATION  *UNUSUAL SHORTNESS OF BREATH  *UNUSUAL BRUISING OR BLEEDING  TENDERNESS IN MOUTH AND THROAT WITH OR WITHOUT PRESENCE OF ULCERS  *URINARY PROBLEMS  *BOWEL PROBLEMS  UNUSUAL RASH Items with * indicate a potential emergency and should be followed up as soon as possible.  Feel free to call the clinic you have any questions or concerns. The clinic phone number is [phone].  Please show the [facility] at check-in to the Emergency Department and triage nurse.

## 2019-04-14 NOTE — Progress Notes (Signed)
Per Dr. Julien Nordmann, okay for patient receive treatment today with ANC 1.0

## 2019-04-14 NOTE — Patient Instructions (Signed)

## 2019-04-14 NOTE — Progress Notes (Signed)
Nutrition follow-up completed with patient during infusion for non-small cell lung cancer. Weight was documented as 136.4 pounds January 20.  This is improved from 124.9 pounds on December 30, 2018. Patient reports she continues to eat well. She denies nutrition impact symptoms. She has no questions or concerns.  Nutrition diagnosis: Unintended weight loss resolved.  Patient educated to continue same strategies for adequate calorie and protein intake.  She will contact me for questions or concerns.  **Disclaimer: This note was dictated with voice recognition software. Similar sounding words can inadvertently be transcribed and this note may contain transcription errors which may not have been corrected upon publication of note.**

## 2019-04-16 ENCOUNTER — Telehealth: Payer: Self-pay | Admitting: Internal Medicine

## 2019-04-16 NOTE — Telephone Encounter (Signed)
Scheduled per los. Called and spoke with patient. Confirmed all appts

## 2019-05-04 NOTE — Progress Notes (Signed)
[facility] OFFICE PROGRESS NOTE  [name], MD [address] [place] [address]  DIAGNOSIS: Stage IV non-small cell lung cancer, squamous cell carcinoma. She presented withright upper lobe lung mass in addition to pleural-based metastasis andmediastinal lymphadenopathy.She was diagnosed in September 2020.  PRIOR THERAPY: None  CURRENT THERAPY: Chemotherapy with carboplatin for an AUC of 5,paclitaxel 175 mg/m, and Keytruda 200 mg IV every 3 weeks with Neulasta support. Firsttreatmenton 12/09/2018.Status post7cycles.  Starting from cycle #5 she is currently on maintenance treatment with single agent Keytruda every 3 weeks.   INTERVAL HISTORY: Debra Rodgers 74 y.o. female returns to clinic today for follow-up visit.  The patient is feeling well today without any concerning complaints.  She denies any fever, chills, night sweats, or weight loss.  She reports her baseline dyspnea on exertion and her mildly productive cough.  Her right upper quadrant/right lower rib pain is tolerable but still present. She states that the pain is not bad enough to warrant taking any medications such as tylenol.  She denies any nausea, vomiting, diarrhea, or constipation.  She denies any headache but endorses her baseline bilateral visual blurring secondary to "needing to go to the eye doctor".  She denies any rashes or skin changes.  She is here today for evaluation before starting cycle #8.   MEDICAL HISTORY: Past Medical History:  Diagnosis Date  . COPD (chronic obstructive pulmonary disease) ([place])   . Depression   . Essential hypertension   . Headache   . History of migraine headaches   . lung ca dx'd 10/2018  . Sleep apnea   . Tobacco use disorder     ALLERGIES:  is allergic to codeine sulfate and pantoprazole sodium.  MEDICATIONS:  Current Outpatient Medications  Medication Sig Dispense Refill  . acetaminophen (TYLENOL) 325 MG tablet Take 2 tablets  (650 mg total) by mouth every 6 (six) hours as needed. 30 tablet 0  . amLODipine (NORVASC) 10 MG tablet Take 1 tablet (10 mg total) by mouth daily. 90 tablet 1  . aspirin 81 MG EC tablet Take 81 mg by mouth daily.      Marland Kitchen atorvastatin (LIPITOR) 20 MG tablet TAKE 1 TABLET BY MOUTH EVERY DAY 90 tablet 3  . chlorthalidone (HYGROTON) 50 MG tablet TAKE 1 TABLET BY MOUTH EVERY DAY 90 tablet 1  . famotidine (PEPCID) 20 MG tablet Take 20 mg by mouth daily.     Marland Kitchen FLUoxetine (PROZAC) 20 MG capsule Take 1 capsule (20 mg total) by mouth daily. 30 capsule 5  . gabapentin (NEURONTIN) 100 MG capsule Take 1 capsule (100 mg total) by mouth 2 (two) times daily. 180 capsule 3  . HYDROcodone-acetaminophen (NORCO) 7.5-325 MG tablet Take 1 tablet by mouth every 6 (six) hours as needed for moderate pain. 60 tablet 0  . lidocaine-prilocaine (EMLA) cream Apply 1 application topically as needed. 30 g 0  . lisinopril (ZESTRIL) 10 MG tablet TAKE 1 TABLET BY MOUTH EVERY DAY 90 tablet 1  . nicotine ([employer] NICOTINE TRANSDERMAL SYS) 14 mg/24hr patch Place 1 patch (14 mg total) onto the skin daily. (Patient not taking: Reported on 03/03/2019) 42 patch 0  . prochlorperazine (COMPAZINE) 10 MG tablet Take 1 tablet (10 mg total) by mouth every 6 (six) hours as needed for nausea or vomiting. 30 tablet 2  . Tiotropium Bromide-Olodaterol (STIOLTO RESPIMAT) 2.5-2.5 MCG/ACT AERS Inhale 2 puffs into the lungs daily. 4 g 5   No current facility-administered medications for  this visit.    SURGICAL HISTORY:  Past Surgical History:  Procedure Laterality Date  . ABDOMINAL HYSTERECTOMY    . CHOLECYSTECTOMY    . IR IMAGING GUIDED PORT INSERTION  02/24/2019  . ROTATOR CUFF REPAIR  3/04  . VIDEO BRONCHOSCOPY WITH ENDOBRONCHIAL ULTRASOUND Right 11/25/2018   Procedure: VIDEO BRONCHOSCOPY WITH ENDOBRONCHIAL ULTRASOUND;  Surgeon: Collene Gobble, MD;  Location: [facility] OR;  Service: Thoracic;  Laterality: Right;    REVIEW OF SYSTEMS:   Review of Systems   Constitutional: Negative for appetite change, chills, fatigue, fever and unexpected weight change.  HENT:  Negative for mouth sores, nosebleeds, sore throat and trouble swallowing.  Eyes: Negative for eye problems and icterus.  Respiratory: Positive for baseline shortness of breath on exertion and cough. Negative for hemoptysis and wheezing.   Cardiovascular: Positive for left sided rib pain (stable). Negative for leg swelling.  Gastrointestinal: Negative for abdominal pain, constipation, diarrhea, nausea and vomiting.  Genitourinary: Negative for bladder incontinence, difficulty urinating, dysuria, frequency and hematuria.   Musculoskeletal: Negative for back pain, gait problem, neck pain and neck stiffness.  Skin: Negative for itching and rash.  Neurological: Negative for dizziness, extremity weakness, gait problem, headaches, light-headedness and seizures.  Hematological: Negative for adenopathy. Does not bruise/bleed easily.  Psychiatric/Behavioral: Negative for confusion, depression and sleep disturbance. The patient is not nervous/anxious.     PHYSICAL EXAMINATION:  There were no vitals taken for this visit.  ECOG PERFORMANCE STATUS: 1 - Symptomatic but completely ambulatory  Physical Exam  Constitutional: Oriented to person, place, and time and well-developed, well-nourished, and in no distress.  HENT:  Head: Normocephalic and atraumatic.  Mouth/Throat: Oropharynx is clear and moist. No oropharyngeal exudate.  Eyes: Conjunctivae are normal. Right eye exhibits no discharge. Left eye exhibits no discharge. No scleral icterus.  Neck: Normal range of motion. Neck supple.  Cardiovascular: Normal rate, regular rhythm, normal heart sounds and intact distal pulses.   Pulmonary/Chest: Effort normal. Decreased breath sounds right lower lobe. No respiratory distress. No wheezes. No rales.  Abdominal: Soft. Bowel sounds are normal. Exhibits no distension and no mass. There is no tenderness.   Musculoskeletal: Normal range of motion. Exhibits no edema.  Lymphadenopathy:    No cervical adenopathy.  Neurological: Alert and oriented to person, place, and time. Exhibits normal muscle tone. Gait normal. Coordination normal.  Skin: Skin is warm and dry. No rash noted. Not diaphoretic. No erythema. No pallor.  Psychiatric: Mood, memory and judgment normal.  Vitals reviewed.  LABORATORY DATA: Lab Results  Component Value Date   WBC 3.0 (L) 04/14/2019   HGB 11.9 (L) 04/14/2019   HCT 35.5 (L) 04/14/2019   MCV 101.1 (H) 04/14/2019   PLT 206 04/14/2019      Chemistry      Component Value Date/Time   NA 137 04/14/2019 0930   NA 139 07/21/2017 0958   K 3.5 04/14/2019 0930   CL 102 04/14/2019 0930   CO2 27 04/14/2019 0930   BUN 33 (H) 04/14/2019 0930   BUN 26 07/21/2017 0958   CREATININE 1.00 04/14/2019 0930   CREATININE 0.79 09/03/2013 1540      Component Value Date/Time   CALCIUM 8.9 04/14/2019 0930   ALKPHOS 63 04/14/2019 0930   AST 14 (L) 04/14/2019 0930   ALT 9 04/14/2019 0930   BILITOT 0.6 04/14/2019 0930       RADIOGRAPHIC STUDIES:  No results found.   ASSESSMENT/PLAN:  This is a very pleasant 74 year old African-American female diagnosed with  stage IV non-small cell lung cancer, squamous cell carcinoma. She presented with a right upper lobe lung mass and pleural based metastases and mediastinal lymphadenopathy.She was diagnosed in August 2020   The patient is currently undergoing systemicchemotherapy with carboplatin for an AUC of 5, paclitaxel 175 mg/m, and Keytruda 200 mg IV every 3 weeks with Neulasta support. Starting from cycle #5, she is on maintenance with single agent Keytruda. she is status post7cyclesof treatment and she tolerated it wellwithout any concerning adverse effectsexcept for mild fatigue.  Labs were reviewed. Recommend that she proceed with cycle #8 today as scheduled.   We will see her back for a follow up visit in 3 weeks  for evaluation before starting cycle #9.   I will arrange for a restaging CT scan of the chest, abdomen, and pelvis before starting cycle #9.   Discussed that she may take tylenol if needed for her rib pain.   The patient was advised to call immediately if she has any concerning symptoms in the interval. The patient voices understanding of current disease status and treatment options and is in agreement with the current care plan. All questions were answered. The patient knows to call the clinic with any problems, questions or concerns. We can certainly see the patient much sooner if necessary.    No orders of the defined types were placed in this encounter.    Ravis Herne L Ashleynicole Mcclees, PA-C 05/04/19

## 2019-05-05 ENCOUNTER — Inpatient Hospital Stay: Payer: Medicare Other

## 2019-05-05 ENCOUNTER — Inpatient Hospital Stay: Payer: Medicare Other | Attending: Physician Assistant | Admitting: Physician Assistant

## 2019-05-05 ENCOUNTER — Other Ambulatory Visit: Payer: Self-pay

## 2019-05-05 ENCOUNTER — Encounter: Payer: Self-pay | Admitting: Physician Assistant

## 2019-05-05 VITALS — BP 111/78 | HR 67 | Temp 97.8°F | Resp 7 | Ht 61.0 in | Wt 139.4 lb

## 2019-05-05 VITALS — Resp 18

## 2019-05-05 DIAGNOSIS — F329 Major depressive disorder, single episode, unspecified: Secondary | ICD-10-CM | POA: Diagnosis not present

## 2019-05-05 DIAGNOSIS — C3411 Malignant neoplasm of upper lobe, right bronchus or lung: Secondary | ICD-10-CM | POA: Insufficient documentation

## 2019-05-05 DIAGNOSIS — I10 Essential (primary) hypertension: Secondary | ICD-10-CM | POA: Diagnosis not present

## 2019-05-05 DIAGNOSIS — J449 Chronic obstructive pulmonary disease, unspecified: Secondary | ICD-10-CM | POA: Insufficient documentation

## 2019-05-05 DIAGNOSIS — Z5112 Encounter for antineoplastic immunotherapy: Secondary | ICD-10-CM | POA: Diagnosis not present

## 2019-05-05 DIAGNOSIS — G4730 Sleep apnea, unspecified: Secondary | ICD-10-CM | POA: Diagnosis not present

## 2019-05-05 DIAGNOSIS — C782 Secondary malignant neoplasm of pleura: Secondary | ICD-10-CM | POA: Insufficient documentation

## 2019-05-05 DIAGNOSIS — C3491 Malignant neoplasm of unspecified part of right bronchus or lung: Secondary | ICD-10-CM

## 2019-05-05 DIAGNOSIS — Z79899 Other long term (current) drug therapy: Secondary | ICD-10-CM | POA: Diagnosis not present

## 2019-05-05 DIAGNOSIS — Z9221 Personal history of antineoplastic chemotherapy: Secondary | ICD-10-CM | POA: Insufficient documentation

## 2019-05-05 DIAGNOSIS — Z7982 Long term (current) use of aspirin: Secondary | ICD-10-CM | POA: Diagnosis not present

## 2019-05-05 LAB — CMP (CANCER CENTER ONLY)
ALT: 12 U/L (ref 0–44)
AST: 16 U/L (ref 15–41)
Albumin: 4.2 g/dL (ref 3.5–5.0)
Alkaline Phosphatase: 54 U/L (ref 38–126)
Anion gap: 9 (ref 5–15)
BUN: 30 mg/dL — ABNORMAL HIGH (ref 8–23)
CO2: 28 mmol/L (ref 22–32)
Calcium: 9.2 mg/dL (ref 8.9–10.3)
Chloride: 102 mmol/L (ref 98–111)
Creatinine: 1.02 mg/dL — ABNORMAL HIGH (ref 0.44–1.00)
GFR, Est AFR Am: >60 mL/min (ref >60)
GFR, Estimated: 55 mL/min — ABNORMAL LOW (ref >60)
Glucose, Bld: 87 mg/dL (ref 70–99)
Potassium: 3.4 mmol/L — ABNORMAL LOW (ref 3.5–5.1)
Sodium: 139 mmol/L (ref 135–145)
Total Bilirubin: 0.7 mg/dL (ref 0.3–1.2)
Total Protein: 7.9 g/dL (ref 6.5–8.1)

## 2019-05-05 LAB — CBC WITH DIFFERENTIAL (CANCER CENTER ONLY)
Abs Immature Granulocytes: 0 10*3/uL (ref 0.00–0.07)
Basophils Absolute: 0.1 10*3/uL (ref 0.0–0.1)
Basophils Relative: 1 %
Eosinophils Absolute: 0.3 10*3/uL (ref 0.0–0.5)
Eosinophils Relative: 9 %
HCT: 35.8 % — ABNORMAL LOW (ref 36.0–46.0)
Hemoglobin: 12.2 g/dL (ref 12.0–15.0)
Immature Granulocytes: 0 %
Lymphocytes Relative: 43 %
Lymphs Abs: 1.5 10*3/uL (ref 0.7–4.0)
MCH: 33.8 pg (ref 26.0–34.0)
MCHC: 34.1 g/dL (ref 30.0–36.0)
MCV: 99.2 fL (ref 80.0–100.0)
Monocytes Absolute: 0.4 10*3/uL (ref 0.1–1.0)
Monocytes Relative: 10 %
Neutro Abs: 1.3 10*3/uL — ABNORMAL LOW (ref 1.7–7.7)
Neutrophils Relative %: 37 %
Platelet Count: 215 10*3/uL (ref 150–400)
RBC: 3.61 MIL/uL — ABNORMAL LOW (ref 3.87–5.11)
RDW: 12.8 % (ref 11.5–15.5)
WBC Count: 3.6 10*3/uL — ABNORMAL LOW (ref 4.0–10.5)
nRBC: 0 % (ref 0.0–0.2)

## 2019-05-05 MED ORDER — SODIUM CHLORIDE 0.9% FLUSH
10.0000 mL | INTRAVENOUS | Status: DC | PRN
  Administered 2019-05-05: 10 mL
  Filled 2019-05-05: qty 10

## 2019-05-05 MED ORDER — SODIUM CHLORIDE 0.9 % IV SOLN
Freq: Once | INTRAVENOUS | Status: AC
  Filled 2019-05-05: qty 250

## 2019-05-05 MED ORDER — HEPARIN SOD (PORK) LOCK FLUSH 100 UNIT/ML IV SOLN
500.0000 [IU] | Freq: Once | INTRAVENOUS | Status: AC | PRN
  Administered 2019-05-05: 500 [IU]
  Filled 2019-05-05: qty 5

## 2019-05-05 MED ORDER — SODIUM CHLORIDE 0.9 % IV SOLN
200.0000 mg | Freq: Once | INTRAVENOUS | Status: AC
  Administered 2019-05-05: 200 mg via INTRAVENOUS
  Filled 2019-05-05: qty 8

## 2019-05-05 NOTE — Patient Instructions (Signed)
EMLA CREAM - apply one hour before we will be using your port, it can remain on for 1 -2 hours but no more than that. Apply generously over the site and DO NOT rub in, gently cover it.  [facility] Discharge Instructions for Patients Receiving Chemotherapy  Today you received the following chemotherapy agents Keytruda. To help prevent nausea and vomiting after your treatment, we encourage you to take your nausea medication as directed.   If you develop nausea and vomiting that is not controlled by your nausea medication, call the clinic.   BELOW ARE SYMPTOMS THAT SHOULD BE REPORTED IMMEDIATELY:  *FEVER GREATER THAN 100.5 F  *CHILLS WITH OR WITHOUT FEVER  NAUSEA AND VOMITING THAT IS NOT CONTROLLED WITH YOUR NAUSEA MEDICATION  *UNUSUAL SHORTNESS OF BREATH  *UNUSUAL BRUISING OR BLEEDING  TENDERNESS IN MOUTH AND THROAT WITH OR WITHOUT PRESENCE OF ULCERS  *URINARY PROBLEMS  *BOWEL PROBLEMS  UNUSUAL RASH Items with * indicate a potential emergency and should be followed up as soon as possible.  Feel free to call the clinic you have any questions or concerns. The clinic phone number is [phone].  Please show the [facility] at check-in to the Emergency Department and triage nurse.

## 2019-05-06 ENCOUNTER — Telehealth: Payer: Self-pay | Admitting: Internal Medicine

## 2019-05-06 NOTE — Telephone Encounter (Signed)
Scheduled per los. Called and left msg. Mailed printout  °

## 2019-05-10 ENCOUNTER — Ambulatory Visit: Payer: 59 | Admitting: Student in an Organized Health Care Education/Training Program

## 2019-05-11 ENCOUNTER — Other Ambulatory Visit: Payer: Self-pay | Admitting: Student in an Organized Health Care Education/Training Program

## 2019-05-11 DIAGNOSIS — Z720 Tobacco use: Secondary | ICD-10-CM

## 2019-05-19 ENCOUNTER — Telehealth: Payer: Self-pay | Admitting: Emergency Medicine

## 2019-05-19 NOTE — Telephone Encounter (Signed)
Checked sample closet and we do not have any Stiolto Respimat.  Called the patient and advised. Suggested she go to manufacturer website to see if there is a savings card available or if she can obtain patient assistance.   Patient voiced understanding, nothing further needed at this time.

## 2019-05-21 ENCOUNTER — Telehealth: Payer: Self-pay | Admitting: Medical Oncology

## 2019-05-21 NOTE — Telephone Encounter (Signed)
I called pt and told her to keep her appts as scheduled even if her CT scan is scheduled later.

## 2019-05-24 ENCOUNTER — Ambulatory Visit (HOSPITAL_COMMUNITY): Payer: Medicare Other

## 2019-05-25 NOTE — Progress Notes (Signed)
[facility] OFFICE PROGRESS NOTE  [name], MD [address] [place] [address]  DIAGNOSIS: Stage IV non-small cell lung cancer, squamous cell carcinoma. She presented withright upper lobe lung mass in addition to pleural-based metastasis andmediastinal lymphadenopathy.She was diagnosed in September 2020.  PRIOR THERAPY: None  CURRENT THERAPY: Chemotherapy with carboplatin for an AUC of 5,paclitaxel 175 mg/m, and Keytruda 200 mg IV every 3 weeks with Neulasta support. Firsttreatmenton 12/09/2018.Status post8cycles. Starting from cycle #5 she is currently on maintenance treatment with single agent Keytruda every 3 weeks.   INTERVAL HISTORY: Debra Rodgers 74 y.o. female returns to clinic today for a follow-up visit.  The patient is feeling well today without any concerning complaints.  She denies any fever, chills, night sweats, or weight loss.  She reports her baseline dyspnea on exertion and her mildly productive cough.  Her left upper quadrant/right lower rib pain is tolerable but still present. This has been present since starting treatment. No evidence of associated cancer involvement in this region on PET or CT's. She states that the pain is not bad enough to warrant taking any medications such as tylenol.  She denies any nausea, vomiting, diarrhea, or constipation.  She reports occasional mild headaches that are relieved with tylenol. Denies any gait changes, extremity weakness, or seizures. She reports unchanged visual blurring secondary to "needing to go to the eye doctor". She denies any rashes or skin changes. She is scheduled for her routine restaging CT scan next week. There was some delay in scheduling her CT scan due to issues with insurance.  She is here today for evaluation before starting cycle #9.   MEDICAL HISTORY: Past Medical History:  Diagnosis Date  . COPD (chronic obstructive pulmonary disease) ([place])   . Depression   .  Essential hypertension   . Headache   . History of migraine headaches   . lung ca dx'd 10/2018  . Sleep apnea   . Tobacco use disorder     ALLERGIES:  is allergic to codeine sulfate and pantoprazole sodium.  MEDICATIONS:  Current Outpatient Medications  Medication Sig Dispense Refill  . acetaminophen (TYLENOL) 325 MG tablet Take 2 tablets (650 mg total) by mouth every 6 (six) hours as needed. 30 tablet 0  . amLODipine (NORVASC) 10 MG tablet Take 1 tablet (10 mg total) by mouth daily. 90 tablet 1  . aspirin 81 MG EC tablet Take 81 mg by mouth daily.      Marland Kitchen atorvastatin (LIPITOR) 20 MG tablet TAKE 1 TABLET BY MOUTH EVERY DAY 90 tablet 3  . chlorthalidone (HYGROTON) 50 MG tablet TAKE 1 TABLET BY MOUTH EVERY DAY 90 tablet 1  . famotidine (PEPCID) 20 MG tablet Take 20 mg by mouth daily.     Marland Kitchen FLUoxetine (PROZAC) 20 MG capsule Take 1 capsule (20 mg total) by mouth daily. 30 capsule 5  . gabapentin (NEURONTIN) 100 MG capsule Take 1 capsule (100 mg total) by mouth 2 (two) times daily. 180 capsule 3  . HYDROcodone-acetaminophen (NORCO) 7.5-325 MG tablet Take 1 tablet by mouth every 6 (six) hours as needed for moderate pain. 60 tablet 0  . lidocaine-prilocaine (EMLA) cream Apply 1 application topically as needed. 30 g 0  . lisinopril (ZESTRIL) 10 MG tablet TAKE 1 TABLET BY MOUTH EVERY DAY 90 tablet 1  . nicotine (NICODERM CQ - DOSED IN MG/24 HOURS) 14 mg/24hr patch PLACE 1 PATCH (14 MG TOTAL) ONTO THE SKIN DAILY. (Patient not taking: Reported  on 05/27/2019) 28 patch 1  . prochlorperazine (COMPAZINE) 10 MG tablet Take 1 tablet (10 mg total) by mouth every 6 (six) hours as needed for nausea or vomiting. 30 tablet 2  . Tiotropium Bromide-Olodaterol (STIOLTO RESPIMAT) 2.5-2.5 MCG/ACT AERS Inhale 2 puffs into the lungs daily. 4 g 5   No current facility-administered medications for this visit.    SURGICAL HISTORY:  Past Surgical History:  Procedure Laterality Date  . ABDOMINAL HYSTERECTOMY    .  CHOLECYSTECTOMY    . IR IMAGING GUIDED PORT INSERTION  02/24/2019  . ROTATOR CUFF REPAIR  3/04  . VIDEO BRONCHOSCOPY WITH ENDOBRONCHIAL ULTRASOUND Right 11/25/2018   Procedure: VIDEO BRONCHOSCOPY WITH ENDOBRONCHIAL ULTRASOUND;  Surgeon: Collene Gobble, MD;  Location: [facility] OR;  Service: Thoracic;  Laterality: Right;    REVIEW OF SYSTEMS:   Review of Systems  Constitutional: Negative for appetite change, chills, fatigue, fever and unexpected weight change.  HENT:  Negative for mouth sores, nosebleeds, sore throat and trouble swallowing.  Eyes: Negative for diplopia, erythema, or icterus.  Respiratory: Positive for baseline shortness of breath on exertion and cough. Negative for hemoptysis and wheezing.   Cardiovascular: Positive for left sided rib pain (stable). Negative for leg swelling.  Gastrointestinal: Negative for abdominal pain, constipation, diarrhea, nausea and vomiting.  Genitourinary: Negative for bladder incontinence, difficulty urinating, dysuria, frequency and hematuria.   Musculoskeletal: Negative for back pain, gait problem, neck pain and neck stiffness.  Skin: Negative for itching and rash.  Neurological: Positive for mild occasional headaches. Negative for dizziness, extremity weakness, gait problem, light-headedness and seizures.  Hematological: Negative for adenopathy. Does not bruise/bleed easily.  Psychiatric/Behavioral: Negative for confusion, depression and sleep disturbance. The patient is not nervous/anxious.    PHYSICAL EXAMINATION:  Blood pressure 110/77, pulse 69, temperature 98.7 F (37.1 C), temperature source Temporal, resp. rate 16, height 5' 1"  (1.549 m), weight 140 lb 8 oz (63.7 kg), SpO2 100 %.  ECOG PERFORMANCE STATUS: 1 - Symptomatic but completely ambulatory  Physical Exam  Constitutional: Oriented to person, place, and time and well-developed, well-nourished, and in no distress.  HENT:  Head: Normocephalic and atraumatic.  Mouth/Throat: Oropharynx is  clear and moist. No oropharyngeal exudate.  Eyes: Conjunctivae are normal. Right eye exhibits no discharge. Left eye exhibits no discharge. No scleral icterus.  Neck: Normal range of motion. Neck supple.  Cardiovascular: Normal rate, regular rhythm, normal heart sounds and intact distal pulses.   Pulmonary/Chest: Effort normal and breath sounds normal. No respiratory distress. No wheezes. No rales.  Abdominal: Soft. Bowel sounds are normal. Exhibits no distension and no mass. Very mild tenderness to palpation over LUQ/Left rib.   Musculoskeletal: Normal range of motion. Exhibits no edema.  Lymphadenopathy:    No cervical adenopathy.  Neurological: Alert and oriented to person, place, and time. Exhibits normal muscle tone. Gait normal. Coordination normal. Cranial nerves II-XII grossly intact. Equal and symmetric strength in upper and lower extremities.  Skin: Skin is warm and dry. No rash noted. Not diaphoretic. No erythema. No pallor.  Psychiatric: Mood, memory and judgment normal.  Vitals reviewed.  LABORATORY DATA: Lab Results  Component Value Date   WBC 3.9 (L) 05/27/2019   HGB 12.2 05/27/2019   HCT 35.9 (L) 05/27/2019   MCV 97.0 05/27/2019   PLT 190 05/27/2019      Chemistry      Component Value Date/Time   NA 139 05/05/2019 1025   NA 139 07/21/2017 0958   K 3.4 (L) 05/05/2019 1025  CL 102 05/05/2019 1025   CO2 28 05/05/2019 1025   BUN 30 (H) 05/05/2019 1025   BUN 26 07/21/2017 0958   CREATININE 1.02 (H) 05/05/2019 1025   CREATININE 0.79 09/03/2013 1540      Component Value Date/Time   CALCIUM 9.2 05/05/2019 1025   ALKPHOS 54 05/05/2019 1025   AST 16 05/05/2019 1025   ALT 12 05/05/2019 1025   BILITOT 0.7 05/05/2019 1025       RADIOGRAPHIC STUDIES:  No results found.   ASSESSMENT/PLAN:  This is a very pleasant 73 year old African-American female diagnosed with stage IV non-small cell lung cancer, squamous cell carcinoma. She presented with a right upper lobe  lung mass and pleural based metastases and mediastinal lymphadenopathy.She was diagnosed in August 2020   The patient is currently undergoing systemicchemotherapy with carboplatin for an AUC of 5, paclitaxel 175 mg/m, and Keytruda 200 mg IV every 3 weeks with Neulasta support. Starting from cycle #5, she is on maintenance with single agent Keytruda. she is status post8cyclesof treatment and she tolerated it wellwithout any concerning adverse effectsexcept for mild fatigue.  Labs were reviewed. Recommend that she proceed with cycle #9 today as scheduled.   She will proceed with her restaging CT scan on 06/01/2019 as scheduled. We will reassess her disease as well as if there is any correlation in left lower lateral rib/LUQ where she is experiencing mild but stable discomfort. No overlaying skin changes seen in this area.   We will see her back for a follow up visit in 3 weeks for evaluation and to review her restaging CT scan before starting cycle #10.   The patient was advised to call immediately if she has any concerning symptoms in the interval. The patient voices understanding of current disease status and treatment options and is in agreement with the current care plan. All questions were answered. The patient knows to call the clinic with any problems, questions or concerns. We can certainly see the patient much sooner if necessary  Orders Placed This Encounter  Procedures  . TSH    Standing Status:   Standing    Number of Occurrences:   18    Standing Expiration Date:   05/26/2020     Khani Paino L Dezman Granda, PA-C 05/27/19

## 2019-05-27 ENCOUNTER — Inpatient Hospital Stay: Payer: Medicare HMO | Attending: Physician Assistant | Admitting: Physician Assistant

## 2019-05-27 ENCOUNTER — Other Ambulatory Visit: Payer: Self-pay | Admitting: Internal Medicine

## 2019-05-27 ENCOUNTER — Other Ambulatory Visit: Payer: Self-pay

## 2019-05-27 ENCOUNTER — Inpatient Hospital Stay: Payer: Medicare HMO

## 2019-05-27 VITALS — BP 110/77 | HR 69 | Temp 98.7°F | Resp 16 | Ht 61.0 in | Wt 140.5 lb

## 2019-05-27 DIAGNOSIS — R911 Solitary pulmonary nodule: Secondary | ICD-10-CM | POA: Insufficient documentation

## 2019-05-27 DIAGNOSIS — J449 Chronic obstructive pulmonary disease, unspecified: Secondary | ICD-10-CM | POA: Insufficient documentation

## 2019-05-27 DIAGNOSIS — C3491 Malignant neoplasm of unspecified part of right bronchus or lung: Secondary | ICD-10-CM

## 2019-05-27 DIAGNOSIS — I714 Abdominal aortic aneurysm, without rupture: Secondary | ICD-10-CM | POA: Diagnosis not present

## 2019-05-27 DIAGNOSIS — I10 Essential (primary) hypertension: Secondary | ICD-10-CM | POA: Diagnosis not present

## 2019-05-27 DIAGNOSIS — F329 Major depressive disorder, single episode, unspecified: Secondary | ICD-10-CM | POA: Diagnosis not present

## 2019-05-27 DIAGNOSIS — G4730 Sleep apnea, unspecified: Secondary | ICD-10-CM | POA: Insufficient documentation

## 2019-05-27 DIAGNOSIS — C3411 Malignant neoplasm of upper lobe, right bronchus or lung: Secondary | ICD-10-CM | POA: Diagnosis not present

## 2019-05-27 DIAGNOSIS — Z5112 Encounter for antineoplastic immunotherapy: Secondary | ICD-10-CM | POA: Diagnosis not present

## 2019-05-27 DIAGNOSIS — R599 Enlarged lymph nodes, unspecified: Secondary | ICD-10-CM | POA: Insufficient documentation

## 2019-05-27 DIAGNOSIS — R51 Headache with orthostatic component, not elsewhere classified: Secondary | ICD-10-CM | POA: Diagnosis not present

## 2019-05-27 DIAGNOSIS — Z7982 Long term (current) use of aspirin: Secondary | ICD-10-CM | POA: Insufficient documentation

## 2019-05-27 DIAGNOSIS — R5383 Other fatigue: Secondary | ICD-10-CM | POA: Insufficient documentation

## 2019-05-27 DIAGNOSIS — Z79899 Other long term (current) drug therapy: Secondary | ICD-10-CM | POA: Insufficient documentation

## 2019-05-27 DIAGNOSIS — C782 Secondary malignant neoplasm of pleura: Secondary | ICD-10-CM | POA: Diagnosis not present

## 2019-05-27 DIAGNOSIS — F17210 Nicotine dependence, cigarettes, uncomplicated: Secondary | ICD-10-CM | POA: Diagnosis not present

## 2019-05-27 LAB — CBC WITH DIFFERENTIAL (CANCER CENTER ONLY)
Abs Immature Granulocytes: 0.01 10*3/uL (ref 0.00–0.07)
Basophils Absolute: 0 10*3/uL (ref 0.0–0.1)
Basophils Relative: 1 %
Eosinophils Absolute: 0.4 10*3/uL (ref 0.0–0.5)
Eosinophils Relative: 9 %
HCT: 35.9 % — ABNORMAL LOW (ref 36.0–46.0)
Hemoglobin: 12.2 g/dL (ref 12.0–15.0)
Immature Granulocytes: 0 %
Lymphocytes Relative: 40 %
Lymphs Abs: 1.6 10*3/uL (ref 0.7–4.0)
MCH: 33 pg (ref 26.0–34.0)
MCHC: 34 g/dL (ref 30.0–36.0)
MCV: 97 fL (ref 80.0–100.0)
Monocytes Absolute: 0.4 10*3/uL (ref 0.1–1.0)
Monocytes Relative: 11 %
Neutro Abs: 1.5 10*3/uL — ABNORMAL LOW (ref 1.7–7.7)
Neutrophils Relative %: 39 %
Platelet Count: 190 10*3/uL (ref 150–400)
RBC: 3.7 MIL/uL — ABNORMAL LOW (ref 3.87–5.11)
RDW: 12.6 % (ref 11.5–15.5)
WBC Count: 3.9 10*3/uL — ABNORMAL LOW (ref 4.0–10.5)
nRBC: 0 % (ref 0.0–0.2)

## 2019-05-27 LAB — CMP (CANCER CENTER ONLY)
ALT: 11 U/L (ref 0–44)
AST: 14 U/L — ABNORMAL LOW (ref 15–41)
Albumin: 4.1 g/dL (ref 3.5–5.0)
Alkaline Phosphatase: 62 U/L (ref 38–126)
Anion gap: 8 (ref 5–15)
BUN: 24 mg/dL — ABNORMAL HIGH (ref 8–23)
CO2: 28 mmol/L (ref 22–32)
Calcium: 9.5 mg/dL (ref 8.9–10.3)
Chloride: 101 mmol/L (ref 98–111)
Creatinine: 0.91 mg/dL (ref 0.44–1.00)
GFR, Est AFR Am: >60 mL/min (ref >60)
GFR, Estimated: >60 mL/min (ref >60)
Glucose, Bld: 87 mg/dL (ref 70–99)
Potassium: 3.5 mmol/L (ref 3.5–5.1)
Sodium: 137 mmol/L (ref 135–145)
Total Bilirubin: 0.9 mg/dL (ref 0.3–1.2)
Total Protein: 7.7 g/dL (ref 6.5–8.1)

## 2019-05-27 MED ORDER — HEPARIN SOD (PORK) LOCK FLUSH 100 UNIT/ML IV SOLN
500.0000 [IU] | Freq: Once | INTRAVENOUS | Status: AC | PRN
  Administered 2019-05-27: 13:00:00 500 [IU]
  Filled 2019-05-27: qty 5

## 2019-05-27 MED ORDER — SODIUM CHLORIDE 0.9% FLUSH
10.0000 mL | INTRAVENOUS | Status: DC | PRN
  Administered 2019-05-27: 10 mL
  Filled 2019-05-27: qty 10

## 2019-05-27 MED ORDER — SODIUM CHLORIDE 0.9 % IV SOLN
200.0000 mg | Freq: Once | INTRAVENOUS | Status: AC
  Administered 2019-05-27: 12:00:00 200 mg via INTRAVENOUS
  Filled 2019-05-27: qty 8

## 2019-05-27 MED ORDER — SODIUM CHLORIDE 0.9 % IV SOLN
Freq: Once | INTRAVENOUS | Status: AC
  Filled 2019-05-27: qty 250

## 2019-05-27 NOTE — Patient Instructions (Signed)
EMLA CREAM - apply one hour before we will be using your port, it can remain on for 1 -2 hours but no more than that. Apply generously over the site and DO NOT rub in, gently cover it.  [facility] Discharge Instructions for Patients Receiving Chemotherapy  Today you received the following chemotherapy agents Keytruda. To help prevent nausea and vomiting after your treatment, we encourage you to take your nausea medication as directed.   If you develop nausea and vomiting that is not controlled by your nausea medication, call the clinic.   BELOW ARE SYMPTOMS THAT SHOULD BE REPORTED IMMEDIATELY:  *FEVER GREATER THAN 100.5 F  *CHILLS WITH OR WITHOUT FEVER  NAUSEA AND VOMITING THAT IS NOT CONTROLLED WITH YOUR NAUSEA MEDICATION  *UNUSUAL SHORTNESS OF BREATH  *UNUSUAL BRUISING OR BLEEDING  TENDERNESS IN MOUTH AND THROAT WITH OR WITHOUT PRESENCE OF ULCERS  *URINARY PROBLEMS  *BOWEL PROBLEMS  UNUSUAL RASH Items with * indicate a potential emergency and should be followed up as soon as possible.  Feel free to call the clinic you have any questions or concerns. The clinic phone number is [phone].  Please show the [place] at check-in to the Emergency Department and triage nurse.

## 2019-06-01 ENCOUNTER — Other Ambulatory Visit: Payer: Self-pay

## 2019-06-01 ENCOUNTER — Encounter (HOSPITAL_COMMUNITY): Payer: Self-pay

## 2019-06-01 ENCOUNTER — Ambulatory Visit (HOSPITAL_COMMUNITY)
Admission: RE | Admit: 2019-06-01 | Discharge: 2019-06-01 | Disposition: A | Discharge status: Home / Self Care | Payer: Medicare HMO | Source: Ambulatory Visit | Attending: Physician Assistant | Admitting: Physician Assistant

## 2019-06-01 DIAGNOSIS — C3491 Malignant neoplasm of unspecified part of right bronchus or lung: Secondary | ICD-10-CM

## 2019-06-01 MED ORDER — SODIUM CHLORIDE (PF) 0.9 % IJ SOLN
INTRAMUSCULAR | Status: AC
  Filled 2019-06-01: qty 50

## 2019-06-01 MED ORDER — IOHEXOL 9 MG/ML PO SOLN
ORAL | Status: AC
  Filled 2019-06-01: qty 500

## 2019-06-01 MED ORDER — IOHEXOL 300 MG/ML  SOLN
100.0000 mL | Freq: Once | INTRAMUSCULAR | Status: AC | PRN
  Administered 2019-06-01: 100 mL via INTRAVENOUS

## 2019-06-01 MED ORDER — IOHEXOL 9 MG/ML PO SOLN
500.0000 mL | ORAL | Status: AC
  Administered 2019-06-01: 1000 mL via ORAL

## 2019-06-01 MED ORDER — HEPARIN SOD (PORK) LOCK FLUSH 100 UNIT/ML IV SOLN
500.0000 [IU] | Freq: Once | INTRAVENOUS | Status: AC
  Administered 2019-06-01: 500 [IU] via INTRAVENOUS

## 2019-06-01 MED ORDER — HEPARIN SOD (PORK) LOCK FLUSH 100 UNIT/ML IV SOLN
INTRAVENOUS | Status: AC
  Filled 2019-06-01: qty 5

## 2019-06-16 ENCOUNTER — Inpatient Hospital Stay: Payer: Medicare HMO

## 2019-06-16 ENCOUNTER — Inpatient Hospital Stay (HOSPITAL_BASED_OUTPATIENT_CLINIC_OR_DEPARTMENT_OTHER): Payer: Medicare HMO | Admitting: Internal Medicine

## 2019-06-16 ENCOUNTER — Other Ambulatory Visit: Payer: Self-pay

## 2019-06-16 ENCOUNTER — Encounter: Payer: Self-pay | Admitting: Internal Medicine

## 2019-06-16 VITALS — BP 121/75 | HR 63 | Temp 98.3°F | Resp 17 | Ht 61.0 in | Wt 140.5 lb

## 2019-06-16 DIAGNOSIS — C3491 Malignant neoplasm of unspecified part of right bronchus or lung: Secondary | ICD-10-CM | POA: Diagnosis not present

## 2019-06-16 DIAGNOSIS — I10 Essential (primary) hypertension: Secondary | ICD-10-CM

## 2019-06-16 DIAGNOSIS — Z5112 Encounter for antineoplastic immunotherapy: Secondary | ICD-10-CM

## 2019-06-16 DIAGNOSIS — Z95828 Presence of other vascular implants and grafts: Secondary | ICD-10-CM

## 2019-06-16 DIAGNOSIS — R5383 Other fatigue: Secondary | ICD-10-CM

## 2019-06-16 LAB — CBC WITH DIFFERENTIAL (CANCER CENTER ONLY)
Abs Immature Granulocytes: 0.01 10*3/uL (ref 0.00–0.07)
Basophils Absolute: 0.1 10*3/uL (ref 0.0–0.1)
Basophils Relative: 1 %
Eosinophils Absolute: 0.6 10*3/uL — ABNORMAL HIGH (ref 0.0–0.5)
Eosinophils Relative: 13 %
HCT: 36.9 % (ref 36.0–46.0)
Hemoglobin: 12.4 g/dL (ref 12.0–15.0)
Immature Granulocytes: 0 %
Lymphocytes Relative: 31 %
Lymphs Abs: 1.4 10*3/uL (ref 0.7–4.0)
MCH: 32.7 pg (ref 26.0–34.0)
MCHC: 33.6 g/dL (ref 30.0–36.0)
MCV: 97.4 fL (ref 80.0–100.0)
Monocytes Absolute: 0.4 10*3/uL (ref 0.1–1.0)
Monocytes Relative: 10 %
Neutro Abs: 1.9 10*3/uL (ref 1.7–7.7)
Neutrophils Relative %: 45 %
Platelet Count: 204 10*3/uL (ref 150–400)
RBC: 3.79 MIL/uL — ABNORMAL LOW (ref 3.87–5.11)
RDW: 13.1 % (ref 11.5–15.5)
WBC Count: 4.4 10*3/uL (ref 4.0–10.5)
nRBC: 0 % (ref 0.0–0.2)

## 2019-06-16 LAB — CMP (CANCER CENTER ONLY)
ALT: 11 U/L (ref 0–44)
AST: 14 U/L — ABNORMAL LOW (ref 15–41)
Albumin: 4.2 g/dL (ref 3.5–5.0)
Alkaline Phosphatase: 65 U/L (ref 38–126)
Anion gap: 10 (ref 5–15)
BUN: 34 mg/dL — ABNORMAL HIGH (ref 8–23)
CO2: 27 mmol/L (ref 22–32)
Calcium: 9.8 mg/dL (ref 8.9–10.3)
Chloride: 101 mmol/L (ref 98–111)
Creatinine: 1.2 mg/dL — ABNORMAL HIGH (ref 0.44–1.00)
GFR, Est AFR Am: 52 mL/min — ABNORMAL LOW (ref >60)
GFR, Estimated: 45 mL/min — ABNORMAL LOW (ref >60)
Glucose, Bld: 86 mg/dL (ref 70–99)
Potassium: 3.7 mmol/L (ref 3.5–5.1)
Sodium: 138 mmol/L (ref 135–145)
Total Bilirubin: 0.8 mg/dL (ref 0.3–1.2)
Total Protein: 7.8 g/dL (ref 6.5–8.1)

## 2019-06-16 LAB — TSH: TSH: 1.932 u[IU]/mL (ref 0.308–3.960)

## 2019-06-16 MED ORDER — HEPARIN SOD (PORK) LOCK FLUSH 100 UNIT/ML IV SOLN
500.0000 [IU] | Freq: Once | INTRAVENOUS | Status: AC | PRN
  Administered 2019-06-16: 14:00:00 500 [IU]
  Filled 2019-06-16: qty 5

## 2019-06-16 MED ORDER — SODIUM CHLORIDE 0.9 % IV SOLN
Freq: Once | INTRAVENOUS | Status: AC
  Filled 2019-06-16: qty 250

## 2019-06-16 MED ORDER — SODIUM CHLORIDE 0.9% FLUSH
10.0000 mL | INTRAVENOUS | Status: DC | PRN
  Administered 2019-06-16: 10 mL
  Filled 2019-06-16: qty 10

## 2019-06-16 MED ORDER — SODIUM CHLORIDE 0.9% FLUSH
10.0000 mL | INTRAVENOUS | Status: DC | PRN
  Administered 2019-06-16: 10 mL via INTRAVENOUS
  Filled 2019-06-16: qty 10

## 2019-06-16 MED ORDER — HEPARIN SOD (PORK) LOCK FLUSH 100 UNIT/ML IV SOLN
500.0000 [IU] | Freq: Once | INTRAVENOUS | Status: DC
  Filled 2019-06-16: qty 5

## 2019-06-16 MED ORDER — SODIUM CHLORIDE 0.9 % IV SOLN
200.0000 mg | Freq: Once | INTRAVENOUS | Status: AC
  Administered 2019-06-16: 14:00:00 200 mg via INTRAVENOUS
  Filled 2019-06-16: qty 8

## 2019-06-16 NOTE — Progress Notes (Signed)
[facility] Telephone:([phone]) [phone]   Fax:(336) [phone]  OFFICE PROGRESS NOTE  [name], MD [place] [address]  DIAGNOSIS: Stage IV non-small cell lung cancer, squamous cell carcinoma. She presented withright upper lobe lung mass in addition to pleural-based metastasis andmediastinal lymphadenopathy.She was diagnosed in September 2020.  PRIOR THERAPY: None  CURRENT THERAPY: Chemotherapy with carboplatin for an AUC of 5,paclitaxel 175 mg/m, and Keytruda 200 mg IV every 3 weeks with Neulasta support. Firsttreatmenton 12/09/2018.Status post6cycles.  Starting from cycle #5 she is currently on maintenance treatment with single agent Keytruda every 3 weeks.  INTERVAL HISTORY: Debra Rodgers 74 y.o. female returns to the clinic today for follow-up visit.  The patient is feeling fine today with no concerning complaints except for mild fatigue.  She denied having any chest pain, shortness of breath, cough or hemoptysis.  She denied having any fever or chills.  She has no nausea, vomiting, diarrhea or constipation.  She denied having any recent weight loss or night sweats.  She continues to tolerate her treatment fairly well.  She is here today for evaluation before starting cycle #10.  MEDICAL HISTORY: Past Medical History:  Diagnosis Date  . COPD (chronic obstructive pulmonary disease) ([place])   . Depression   . Essential hypertension   . Headache   . History of migraine headaches   . lung ca dx'd 10/2018  . Sleep apnea   . Tobacco use disorder     ALLERGIES:  is allergic to codeine sulfate and pantoprazole sodium.  MEDICATIONS:  Current Outpatient Medications  Medication Sig Dispense Refill  . nicotine (NICODERM CQ - DOSED IN MG/24 HOURS) 14 mg/24hr patch PLACE 1 PATCH (14 MG TOTAL) ONTO THE SKIN DAILY. (Patient not taking: Reported on 05/27/2019) 28 patch 1  . acetaminophen (TYLENOL) 325 MG tablet Take 2 tablets (650 mg  total) by mouth every 6 (six) hours as needed. 30 tablet 0  . amLODipine (NORVASC) 10 MG tablet Take 1 tablet (10 mg total) by mouth daily. 90 tablet 1  . aspirin 81 MG EC tablet Take 81 mg by mouth daily.      Marland Kitchen atorvastatin (LIPITOR) 20 MG tablet TAKE 1 TABLET BY MOUTH EVERY DAY 90 tablet 3  . chlorthalidone (HYGROTON) 50 MG tablet TAKE 1 TABLET BY MOUTH EVERY DAY 90 tablet 1  . famotidine (PEPCID) 20 MG tablet Take 20 mg by mouth daily.     Marland Kitchen FLUoxetine (PROZAC) 20 MG capsule Take 1 capsule (20 mg total) by mouth daily. 30 capsule 5  . gabapentin (NEURONTIN) 100 MG capsule Take 1 capsule (100 mg total) by mouth 2 (two) times daily. 180 capsule 3  . HYDROcodone-acetaminophen (NORCO) 7.5-325 MG tablet Take 1 tablet by mouth every 6 (six) hours as needed for moderate pain. 60 tablet 0  . lidocaine-prilocaine (EMLA) cream Apply 1 application topically as needed. 30 g 0  . lisinopril (ZESTRIL) 10 MG tablet TAKE 1 TABLET BY MOUTH EVERY DAY 90 tablet 1  . prochlorperazine (COMPAZINE) 10 MG tablet Take 1 tablet (10 mg total) by mouth every 6 (six) hours as needed for nausea or vomiting. 30 tablet 2  . Tiotropium Bromide-Olodaterol (STIOLTO RESPIMAT) 2.5-2.5 MCG/ACT AERS Inhale 2 puffs into the lungs daily. 4 g 5   No current facility-administered medications for this visit.    SURGICAL HISTORY:  Past Surgical History:  Procedure Laterality Date  . ABDOMINAL HYSTERECTOMY    . CHOLECYSTECTOMY    .  IR IMAGING GUIDED PORT INSERTION  02/24/2019  . ROTATOR CUFF REPAIR  3/04  . VIDEO BRONCHOSCOPY WITH ENDOBRONCHIAL ULTRASOUND Right 11/25/2018   Procedure: VIDEO BRONCHOSCOPY WITH ENDOBRONCHIAL ULTRASOUND;  Surgeon: Collene Gobble, MD;  Location: [facility] OR;  Service: Thoracic;  Laterality: Right;    REVIEW OF SYSTEMS:  A comprehensive review of systems was negative except for: Constitutional: positive for fatigue Neurological: positive for headaches   PHYSICAL EXAMINATION: General appearance: alert,  cooperative, fatigued and no distress Head: Normocephalic, without obvious abnormality, atraumatic Neck: no adenopathy, no JVD, supple, symmetrical, trachea midline and thyroid not enlarged, symmetric, no tenderness/mass/nodules Lymph nodes: Cervical, supraclavicular, and axillary nodes normal. Resp: clear to auscultation bilaterally Back: symmetric, no curvature. ROM normal. No CVA tenderness. Cardio: regular rate and rhythm, S1, S2 normal, no murmur, click, rub or gallop GI: soft, non-tender; bowel sounds normal; no masses,  no organomegaly Extremities: extremities normal, atraumatic, no cyanosis or edema  ECOG PERFORMANCE STATUS: 1 - Symptomatic but completely ambulatory  Blood pressure 121/75, pulse 63, temperature 98.3 F (36.8 C), temperature source Oral, resp. rate 17, height 5' 1"  (1.549 m), weight 140 lb 8 oz (63.7 kg), SpO2 97 %.  LABORATORY DATA: Lab Results  Component Value Date   WBC 4.4 06/16/2019   HGB 12.4 06/16/2019   HCT 36.9 06/16/2019   MCV 97.4 06/16/2019   PLT 204 06/16/2019      Chemistry      Component Value Date/Time   NA 138 06/16/2019 1140   NA 139 07/21/2017 0958   K 3.7 06/16/2019 1140   CL 101 06/16/2019 1140   CO2 27 06/16/2019 1140   BUN 34 (H) 06/16/2019 1140   BUN 26 07/21/2017 0958   CREATININE 1.20 (H) 06/16/2019 1140   CREATININE 0.79 09/03/2013 1540      Component Value Date/Time   CALCIUM 9.8 06/16/2019 1140   ALKPHOS 65 06/16/2019 1140   AST 14 (L) 06/16/2019 1140   ALT 11 06/16/2019 1140   BILITOT 0.8 06/16/2019 1140       RADIOGRAPHIC STUDIES: CT Chest W Contrast  Result Date: 06/01/2019 CLINICAL DATA:  Lung cancer. Restaging. EXAM: CT CHEST, ABDOMEN, AND PELVIS WITH CONTRAST TECHNIQUE: Multidetector CT imaging of the chest, abdomen and pelvis was performed following the standard protocol during bolus administration of intravenous contrast. CONTRAST:  121m OMNIPAQUE IOHEXOL 300 MG/ML  SOLN COMPARISON:  02/26/2019 FINDINGS:  CT CHEST FINDINGS Cardiovascular: The heart size is normal. No substantial pericardial effusion. Coronary artery calcification is evident. Atherosclerotic calcification is noted in the wall of the thoracic aorta. Right Port-A-Cath tip is positioned in the distal SVC. Small thrombus associated with the but catheter at the internal jugular venotomy site. Mediastinum/Nodes: Stable 8 mm right paratracheal node (13/2). 15 mm short axis precarinal node is stable when I remeasure in a similar fashion on the prior study. Index subcarinal lymph node measured previously at 13 mm short axis is 12 mm short axis today. Small lymph nodes in the right hilum are stable. The esophagus has normal imaging features. There is no axillary lymphadenopathy. Lungs/Pleura: Centrilobular and paraseptal emphysema evident. Similar appearance of the posterolateral right middle lobe lesion, measuring 2.0 x 1.0 cm today (95/7) which compares to 1.9 x 1.0 cm previously. The subpleural 8 mm right middle lobe nodular opacity measured previously has resolved completely in the interval. 3 mm right lower lobe nodule on 76/7 is new in the interval. Otherwise no new suspicious pulmonary nodule or mass. No focal airspace consolidation. No  pleural effusion. Musculoskeletal: No worrisome lytic or sclerotic osseous abnormality. CT ABDOMEN PELVIS FINDINGS Hepatobiliary: Tiny low-density lesion inferior segment IV is stable. 9 mm low-density lesion in the lateral right liver is unchanged. Gallbladder surgically absent. Stable mild intra and extrahepatic biliary duct dilatation. Pancreas: No focal mass lesion. No dilatation of the main duct. No intraparenchymal cyst. No peripancreatic edema. Spleen: No splenomegaly. No focal mass lesion. Adrenals/Urinary Tract: No adrenal nodule or mass. Kidneys unremarkable. No evidence for hydroureter. Mild circumferential bladder wall thickening may be accentuated by underdistention. Stomach/Bowel: Stomach is unremarkable. No  gastric wall thickening. No evidence of outlet obstruction. Duodenum is normally positioned as is the ligament of Treitz. No small bowel wall thickening. No small bowel dilatation. The terminal ileum is normal. The appendix is not visualized, but there is no edema or inflammation in the region of the cecum. No gross colonic mass. No colonic wall thickening. Vascular/Lymphatic: Juxta diaphragmatic abdominal aortic aneurysm is similar to prior measuring up to 4.4 cm diameter. There is no gastrohepatic or hepatoduodenal ligament lymphadenopathy. No retroperitoneal or mesenteric lymphadenopathy. No pelvic sidewall lymphadenopathy. Reproductive: Slight progression 6.8 x 6.5 cm cystic mass in the left adnexal space, increased from 6.0 x 5.9 cm previously. Uterus surgically absent. Other: No intraperitoneal free fluid. Musculoskeletal: Sclerosis in the left pubic bone is stable. No worrisome lytic or sclerotic osseous abnormality. IMPRESSION: 1. Stable nodular residual in the posterolateral right middle lobe with interval resolution of the subpleural 8 mm right middle lobe pulmonary nodule seen previously. Thoracic nodal metastases are stable to minimally smaller in the interval. No new suspicious or progressive findings on today's exam. 2. 3 mm right lower lobe pulmonary nodule, new in the interval. Most likely benign, but attention on follow-up recommended. 3. Slight progression of the cystic left adnexal mass. 4. 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 5. Stable mild intra and extrahepatic biliary duct dilatation. 6. Small thrombus associated with the right Port-A-Cath at the internal jugular venotomy site. 7. Aortic Atherosclerosis (ICD10-I70.0) and Emphysema (ICD10-J43.9). Electronically Signed   By: Misty Stanley M.D.   On: 06/01/2019 16:36   CT Abdomen Pelvis W Contrast  Result Date: 06/01/2019 CLINICAL DATA:  Lung cancer. Restaging. EXAM: CT CHEST, ABDOMEN, AND PELVIS WITH CONTRAST  TECHNIQUE: Multidetector CT imaging of the chest, abdomen and pelvis was performed following the standard protocol during bolus administration of intravenous contrast. CONTRAST:  120m OMNIPAQUE IOHEXOL 300 MG/ML  SOLN COMPARISON:  02/26/2019 FINDINGS: CT CHEST FINDINGS Cardiovascular: The heart size is normal. No substantial pericardial effusion. Coronary artery calcification is evident. Atherosclerotic calcification is noted in the wall of the thoracic aorta. Right Port-A-Cath tip is positioned in the distal SVC. Small thrombus associated with the but catheter at the internal jugular venotomy site. Mediastinum/Nodes: Stable 8 mm right paratracheal node (13/2). 15 mm short axis precarinal node is stable when I remeasure in a similar fashion on the prior study. Index subcarinal lymph node measured previously at 13 mm short axis is 12 mm short axis today. Small lymph nodes in the right hilum are stable. The esophagus has normal imaging features. There is no axillary lymphadenopathy. Lungs/Pleura: Centrilobular and paraseptal emphysema evident. Similar appearance of the posterolateral right middle lobe lesion, measuring 2.0 x 1.0 cm today (95/7) which compares to 1.9 x 1.0 cm previously. The subpleural 8 mm right middle lobe nodular opacity measured previously has resolved completely in the interval. 3 mm right lower lobe nodule on 76/7 is new in the interval.  Otherwise no new suspicious pulmonary nodule or mass. No focal airspace consolidation. No pleural effusion. Musculoskeletal: No worrisome lytic or sclerotic osseous abnormality. CT ABDOMEN PELVIS FINDINGS Hepatobiliary: Tiny low-density lesion inferior segment IV is stable. 9 mm low-density lesion in the lateral right liver is unchanged. Gallbladder surgically absent. Stable mild intra and extrahepatic biliary duct dilatation. Pancreas: No focal mass lesion. No dilatation of the main duct. No intraparenchymal cyst. No peripancreatic edema. Spleen: No  splenomegaly. No focal mass lesion. Adrenals/Urinary Tract: No adrenal nodule or mass. Kidneys unremarkable. No evidence for hydroureter. Mild circumferential bladder wall thickening may be accentuated by underdistention. Stomach/Bowel: Stomach is unremarkable. No gastric wall thickening. No evidence of outlet obstruction. Duodenum is normally positioned as is the ligament of Treitz. No small bowel wall thickening. No small bowel dilatation. The terminal ileum is normal. The appendix is not visualized, but there is no edema or inflammation in the region of the cecum. No gross colonic mass. No colonic wall thickening. Vascular/Lymphatic: Juxta diaphragmatic abdominal aortic aneurysm is similar to prior measuring up to 4.4 cm diameter. There is no gastrohepatic or hepatoduodenal ligament lymphadenopathy. No retroperitoneal or mesenteric lymphadenopathy. No pelvic sidewall lymphadenopathy. Reproductive: Slight progression 6.8 x 6.5 cm cystic mass in the left adnexal space, increased from 6.0 x 5.9 cm previously. Uterus surgically absent. Other: No intraperitoneal free fluid. Musculoskeletal: Sclerosis in the left pubic bone is stable. No worrisome lytic or sclerotic osseous abnormality. IMPRESSION: 1. Stable nodular residual in the posterolateral right middle lobe with interval resolution of the subpleural 8 mm right middle lobe pulmonary nodule seen previously. Thoracic nodal metastases are stable to minimally smaller in the interval. No new suspicious or progressive findings on today's exam. 2. 3 mm right lower lobe pulmonary nodule, new in the interval. Most likely benign, but attention on follow-up recommended. 3. Slight progression of the cystic left adnexal mass. 4. 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 5. Stable mild intra and extrahepatic biliary duct dilatation. 6. Small thrombus associated with the right Port-A-Cath at the internal jugular venotomy site. 7. Aortic  Atherosclerosis (ICD10-I70.0) and Emphysema (ICD10-J43.9). Electronically Signed   By: Misty Stanley M.D.   On: 06/01/2019 16:36    ASSESSMENT AND PLAN: This is a very pleasant 74 years old African-American female with stage IV non-small cell carcinoma,, squamous cell carcinoma diagnosed in September 2020.  She presented with extensive right-sided pleural and thoracic nodal hypermetabolic disease with no extrathoracic disease. The patient started induction treatment with systemic chemotherapy with carboplatin, paclitaxel and Keytruda status post 4 cycles with partial response after cycle #4.  She is currently undergoing maintenance treatment with single agent Keytruda status post 5 cycles.  She continues to tolerate her treatment fairly well with no concerning adverse effect except for mild fatigue. I recommended for the patient to proceed with cycle #6 of her maintenance treatment today as planned. She will come back for follow-up visit in 3 weeks for evaluation before the next cycle of her treatment. The patient was advised to call immediately if she has any concerning symptoms in the interval. The patient voices understanding of current disease status and treatment options and is in agreement with the current care plan.  All questions were answered. The patient knows to call the clinic with any problems, questions or concerns. We can certainly see the patient much sooner if necessary.  Disclaimer: This note was dictated with voice recognition software. Similar sounding words can inadvertently be transcribed and may not be  corrected upon review.

## 2019-06-16 NOTE — Patient Instructions (Signed)
[facility] Discharge Instructions for Patients Receiving Chemotherapy  Today you received the following chemotherapy agents Pembrolizumab [facility]).  To help prevent nausea and vomiting after your treatment, we encourage you to take your nausea medication as prescribed.   If you develop nausea and vomiting that is not controlled by your nausea medication, call the clinic.   BELOW ARE SYMPTOMS THAT SHOULD BE REPORTED IMMEDIATELY:  *FEVER GREATER THAN 100.5 F  *CHILLS WITH OR WITHOUT FEVER  NAUSEA AND VOMITING THAT IS NOT CONTROLLED WITH YOUR NAUSEA MEDICATION  *UNUSUAL SHORTNESS OF BREATH  *UNUSUAL BRUISING OR BLEEDING  TENDERNESS IN MOUTH AND THROAT WITH OR WITHOUT PRESENCE OF ULCERS  *URINARY PROBLEMS  *BOWEL PROBLEMS  UNUSUAL RASH Items with * indicate a potential emergency and should be followed up as soon as possible.  Feel free to call the clinic should you have any questions or concerns. The clinic phone number is [phone].  Please show the [facility] at check-in to the Emergency Department and triage nurse.  Coronavirus (COVID-19) Are you at risk?  Are you at risk for the Coronavirus (COVID-19)?  To be considered HIGH RISK for Coronavirus (COVID-19), you have to meet the following criteria:  . Traveled to [place], [place], [place], [place] or [place]; or in the [place] to [place], [place], [place], or [place]; and have fever, cough, and shortness of breath within the last 2 weeks of travel OR . Been in close contact with a person diagnosed with COVID-19 within the last 2 weeks and have fever, cough, and shortness of breath . IF YOU DO NOT MEET THESE CRITERIA, YOU ARE CONSIDERED LOW RISK FOR COVID-19.  What to do if you are HIGH RISK for COVID-19?  Marland Kitchen If you are having a medical emergency, call 911. . Seek medical care right away. Before you go to a doctor's office, urgent care or emergency department, call ahead and tell  them about your recent travel, contact with someone diagnosed with COVID-19, and your symptoms. You should receive instructions from your physician's office regarding next steps of care.  . When you arrive at healthcare provider, tell the healthcare staff immediately you have returned from visiting [place], [place], [place], [place] or [place]; or traveled in the [place] to [place], [place], [place], or [place]; in the last two weeks or you have been in close contact with a person diagnosed with COVID-19 in the last 2 weeks.   . Tell the health care staff about your symptoms: fever, cough and shortness of breath. . After you have been seen by a medical provider, you will be either: o Tested for (COVID-19) and discharged home on quarantine except to seek medical care if symptoms worsen, and asked to  - Stay home and avoid contact with others until you get your results (4-5 days)  - Avoid travel on public transportation if possible (such as bus, train, or airplane) or o Sent to the Emergency Department by EMS for evaluation, COVID-19 testing, and possible admission depending on your condition and test results.  What to do if you are LOW RISK for COVID-19?  Reduce your risk of any infection by using the same precautions used for avoiding the common cold or flu:  Marland Kitchen Wash your hands often with soap and warm water for at least 20 seconds.  If soap and water are not readily available, use an alcohol-based hand sanitizer with at least 60% alcohol.  . If coughing or  sneezing, cover your mouth and nose by coughing or sneezing into the elbow areas of your shirt or coat, into a tissue or into your sleeve (not your hands). . Avoid shaking hands with others and consider head nods or verbal greetings only. . Avoid touching your eyes, nose, or mouth with unwashed hands.  . Avoid close contact with people who are sick. . Avoid places or events with large numbers of people in one location, like concerts or  sporting events. . Carefully consider travel plans you have or are making. . If you are planning any travel outside or inside the [place], visit the CDC's Travelers' Health webpage for the latest health notices. . If you have some symptoms but not all symptoms, continue to monitor at home and seek medical attention if your symptoms worsen. . If you are having a medical emergency, call 911.   [place] / e-Visit: [email or web address]         [facility]: [facility]: [phone]                   [facility]: [phone]

## 2019-06-18 ENCOUNTER — Telehealth: Payer: Self-pay | Admitting: Internal Medicine

## 2019-06-18 NOTE — Telephone Encounter (Signed)
Scheduled per los. Called and left msg. Mailed printout  °

## 2019-06-28 ENCOUNTER — Other Ambulatory Visit: Payer: Self-pay | Admitting: Student in an Organized Health Care Education/Training Program

## 2019-06-28 DIAGNOSIS — I10 Essential (primary) hypertension: Secondary | ICD-10-CM

## 2019-06-30 ENCOUNTER — Telehealth: Payer: Self-pay | Admitting: Emergency Medicine

## 2019-06-30 NOTE — Telephone Encounter (Signed)
Spoke with patient. She stated that she has been struggling with her allergies this year. She has had a headache, sneezing and a productive cough. She stated that these are not new symptoms as she struggles with allergies every year around this time. She also denied being around anyone with COVID.   I advised her that since these were not new symptoms, she is cleared to come in tomorrow for her appt at 215pm.   She verbalized understanding.   Nothing further needed at time of call.

## 2019-07-01 ENCOUNTER — Ambulatory Visit: Payer: Medicare HMO | Admitting: Emergency Medicine

## 2019-07-01 ENCOUNTER — Other Ambulatory Visit: Payer: Self-pay

## 2019-07-01 ENCOUNTER — Encounter: Payer: Self-pay | Admitting: Emergency Medicine

## 2019-07-01 DIAGNOSIS — Z720 Tobacco use: Secondary | ICD-10-CM | POA: Diagnosis not present

## 2019-07-01 DIAGNOSIS — J439 Emphysema, unspecified: Secondary | ICD-10-CM | POA: Diagnosis not present

## 2019-07-01 DIAGNOSIS — G4733 Obstructive sleep apnea (adult) (pediatric): Secondary | ICD-10-CM

## 2019-07-01 DIAGNOSIS — J301 Allergic rhinitis due to pollen: Secondary | ICD-10-CM

## 2019-07-01 DIAGNOSIS — C3491 Malignant neoplasm of unspecified part of right bronchus or lung: Secondary | ICD-10-CM | POA: Diagnosis not present

## 2019-07-01 MED ORDER — ALBUTEROL SULFATE HFA 108 (90 BASE) MCG/ACT IN AERS: 2.0000 | INHALATION_SPRAY | Freq: Four times a day (QID) | RESPIRATORY_TRACT | 5 refills | Status: DC | PRN

## 2019-07-01 NOTE — Assessment & Plan Note (Signed)
Congratulations on stopping smoking!

## 2019-07-01 NOTE — Patient Instructions (Addendum)
Continue Stiolto 2 puffs once daily We will write a prescription for albuterol.  Use this 2 puffs up to every 4 hours if needed for shortness of breath, chest tightness, wheezing. Please start fluticasone nasal spray, 2 sprays each nostril once daily. Please start loratadine 10 mg (Claritin) once daily. Please talk to Dr. Evette Doffing about possibly changing your lisinopril to an alternative blood pressure medicine that does not exacerbate coughing. We will work with [employer] to get you a new CPAP machine.  We will keep the same settings. Follow with Dr. Earlie Server as planned Congratulations on stopping smoking! Follow with Dr. Lamonte Sakai in 2 months or sooner if you have any problems.

## 2019-07-01 NOTE — Progress Notes (Signed)
Pharmacist Chemotherapy Monitoring - Follow Up Assessment    I verify that I have reviewed each item in the below checklist:  . Regimen for the patient is scheduled for the appropriate day and plan matches scheduled date. Marland Kitchen Appropriate non-routine labs are ordered dependent on drug ordered. . If applicable, additional medications reviewed and ordered per protocol based on lifetime cumulative doses and/or treatment regimen.   Plan for follow-up and/or issues identified: No . I-vent associated with next due treatment: No . MD and/or nursing notified: No  Debra Rodgers K 07/01/2019 10:55 AM

## 2019-07-01 NOTE — Assessment & Plan Note (Signed)
We will work with [employer] to get you a new CPAP machine.  We will keep the same settings.

## 2019-07-01 NOTE — Assessment & Plan Note (Signed)
Follow with Dr. Earlie Server as planned Congratulations on stopping smoking!

## 2019-07-01 NOTE — Progress Notes (Addendum)
Subjective:    Patient ID: Debra Rodgers, female    DOB: Feb 15, 1946, 74 y.o.   MRN: [id number]  HPI 74 year old woman, smoker (35 pack years) with hypertension, migraines.  Referred today by Dr. Earlie Server for an abnormal CT scan of the chest.  She has been experiencing bilateral rib pain, upper left abdominal pain since 2-3 weeks ago.  She was seen in the emergency department and a CT scan of her abdomen and chest revealed a right middle lobe mass, 4.1 cm with bilateral pleural-based nodules and small effusion, thickening, mediastinal lymphadenopathy.  The left lung was uninvolved.  This prompted PET scan on 8/26 which I reviewed and which shows hypermetabolism in the right middle lobe mass, the pleural nodules and in the mediastinum.  There was no evidence of extrathoracic hypermetabolism seen.  Finally an MRI brain was done on 8/27 that I reviewed, shows no intracranial metastatic disease.  She presents today to address her abnormal CT. She denies any breathing limitations. She does pace herself with walking, due to fatigue and some dizziness. She paces herself to do housework. She has occasional cough. Has some clear sputum, never hemoptysis. Her cigarettes are down to 2-3 a day. Has albuterol, uses it a few times a week.   ROV 12/17/2018 --this a follow-up visit for 74 year old smoker. She has COPD, OSA. We performed bronchoscopy for evaluation of right middle lobe mass with pleural-based nodular disease and mediastinal lymphadenopathy on 11/25/2018.  The pathology is consistent with squamous cell lung cancer.  She was planned to initiate carboplatin, paclitaxel, Keytruda with Neulasta support.  She is having bilateral costal marginal pain, worst when she lays down. She has exertional SOB. Uses albuterol rarely. Pneumonia vaccine UTD. She had the flu shot yesterday She is reliable with CPAP qhs.   She has mixed obstruction and restriction on PFT from 10/09/2015.  She not on any scheduled bronchodilator  therapy currently.  She hasn't smoked since 11/04/2018  ROV 07/01/19 --Debra Rodgers is 74 with a history of tobacco and COPD, squamous cell lung cancer of the right middle lobe, OSA on CPAP.  She is followed by Dr. Julien Nordmann for stage IV disease, is on maintenance Keytruda. Current bronchodilator regimen includes Stiolto which we started last visit, albuterol which she uses approximately. She believes that the stiolto has been helpful, cough and breathing better. For the last few weeks she has had more cough, allergic rhinitis. She is on zestril. No GERD sx. She does not have an albuterol HFA.  She is reliable with her CPAP, wears it every night. Her machine is old - [employer] provides her equipment. She needs new one.  She notes that she is reliable with her CPAP, wears it consistently for greater than 4 hours each night.  She notes good clinical benefit with increased daytime energy, decreased daytime sleepiness.   Review of Systems  Constitutional: Negative for fever and unexpected weight change.  HENT: Negative for congestion, dental problem, ear pain, nosebleeds, postnasal drip, rhinorrhea, sinus pressure, sneezing, sore throat and trouble swallowing.   Eyes: Negative for redness and itching.  Respiratory: Negative for cough, chest tightness, shortness of breath and wheezing.   Cardiovascular: Positive for chest pain. Negative for palpitations and leg swelling.  Gastrointestinal: Negative for nausea and vomiting.  Genitourinary: Negative for dysuria.  Musculoskeletal: Negative for joint swelling.  Skin: Negative for rash.  Neurological: Negative for headaches.  Hematological: Does not bruise/bleed easily.  Psychiatric/Behavioral: Negative for dysphoric mood. The patient is not nervous/anxious.  Objective:   Physical Exam  Vitals:   07/01/19 1405  BP: 110/66  Pulse: 95  Temp: 98.8 F (37.1 C)  TempSrc: Temporal  SpO2: 100%  Weight: 142 lb 6.4 oz (64.6 kg)  Height: 5' 1.5" (1.562  m)   Gen: Pleasant, well-nourished, in no distress,  normal affect  ENT: No lesions,  mouth clear,  oropharynx clear, no postnasal drip  Neck: No JVD, no stridor  Lungs: No use of accessory muscles, no crackles or wheezing on normal respiration, no wheeze on forced expiration  Cardiovascular: RRR, heart sounds normal, no murmur or gallops, no peripheral edema  Musculoskeletal: No deformities, no cyanosis or clubbing; some R and L costal margin tenderness to palpation. No masses or nodules seen  Neuro: alert, awake, non focal  Skin: Warm, no lesions or rash     Assessment & Plan:  COPD (chronic obstructive pulmonary disease) (HCC) Continue Stiolto 2 puffs once daily We will write a prescription for albuterol.  Use this 2 puffs up to every 4 hours if needed for shortness of breath, chest tightness, wheezing. Follow with Dr. Lamonte Sakai in 2 months or sooner if you have any problem  OSA (obstructive sleep apnea) We will work with [employer] to get you a new CPAP machine.  We will keep the same settings.   Stage IV squamous cell carcinoma of right lung ([place]) Follow with Dr. Earlie Server as planned Congratulations on stopping smoking!   Tobacco use Congratulations on stopping smoking!   Allergic rhinitis Please start fluticasone nasal spray, 2 sprays each nostril once daily. Please start loratadine 10 mg (Claritin) once daily.    Baltazar Apo, MD, PhD 07/01/2019, 2:43 PM [facility] [phone] or if no answer [phone]

## 2019-07-01 NOTE — Assessment & Plan Note (Signed)
Continue Stiolto 2 puffs once daily We will write a prescription for albuterol.  Use this 2 puffs up to every 4 hours if needed for shortness of breath, chest tightness, wheezing. Follow with Dr. Lamonte Sakai in 2 months or sooner if you have any problem

## 2019-07-01 NOTE — Assessment & Plan Note (Signed)
Please start fluticasone nasal spray, 2 sprays each nostril once daily. Please start loratadine 10 mg (Claritin) once daily.

## 2019-07-02 ENCOUNTER — Telehealth: Payer: Self-pay | Admitting: Emergency Medicine

## 2019-07-02 NOTE — Telephone Encounter (Signed)
[employer]'s response to DME order places 07/01/19:  Elon Alas sent to Angelina Sheriff, Rachell Cipro, [place]; Griselda Miner, the current office visit notes from Dr. Lamonte Sakai do not speak of the patients usage and benefit of PAP therapy. This is an Insurance underwriter requirement.   He can amend the notes to include this information or the patient will need to be scheduled for this discussion.   Thank you.

## 2019-07-02 NOTE — Telephone Encounter (Signed)
Dr. Lamonte Sakai can you addend your note to state that pt is using and benefiting from CPAP therapy? Please advise.

## 2019-07-05 NOTE — Telephone Encounter (Signed)
Debra Rodgers, can you make [employer] aware that ov note has been addended? Thanks

## 2019-07-05 NOTE — Progress Notes (Signed)
[facility] OFFICE PROGRESS NOTE  [name], MD [address] [place] [address]  DIAGNOSIS: Stage IV non-small cell lung cancer, squamous cell carcinoma. She presented withright upper lobe lung mass in addition to pleural-based metastasis andmediastinal lymphadenopathy.She was diagnosed in September 2020.  PRIOR THERAPY: None  CURRENT THERAPY: Chemotherapy with carboplatin for an AUC of 5,paclitaxel 175 mg/m, and Keytruda 200 mg IV every 3 weeks with Neulasta support. Firsttreatmenton 12/09/2018.Status post10cycles. Starting from cycle #5 she is currently on maintenance treatment with single agent Keytruda every 3 weeks.   INTERVAL HISTORY: Debra Rodgers 74 y.o. female returns to the clinic for a follow up visit. The patient is feeling well today without any concerning complaints. The patient continues to tolerate treatment with single agent Keytruda well without any adverse side effects. Denies any fever, chills, night sweats, or weight loss. Denies any hemoptysis. She reports her baseline dyspnea on exertion and her mildly productive cough. She also occasionally experiences wheezing. She recently saw her pulmonologist who prescribed her new inhalers.She is reporting stable LUQ/left lower rib pain which has been present for several months. She takes tylenol if needed for the discomfort. No clear etiology is present on any of her imaging studies. Denies any nausea, vomiting, diarrhea, or constipation. She reports occasional mild headaches that are relieved with tylenol. Denies any gait changes, extremity weakness, or seizures. She reports unchanged visual blurring secondary to "needing to go to the eye doctor".  Denies any rashes or skin changes. The patient is here today for evaluation prior to starting cycle # 11   MEDICAL HISTORY: Past Medical History:  Diagnosis Date  . COPD (chronic obstructive pulmonary disease) ([place])   . Depression   .  Essential hypertension   . Headache   . History of migraine headaches   . lung ca dx'd 10/2018  . Sleep apnea   . Tobacco use disorder     ALLERGIES:  is allergic to codeine sulfate and pantoprazole sodium.  MEDICATIONS:  Current Outpatient Medications  Medication Sig Dispense Refill  . acetaminophen (TYLENOL) 325 MG tablet Take 2 tablets (650 mg total) by mouth every 6 (six) hours as needed. 30 tablet 0  . albuterol (VENTOLIN HFA) 108 (90 Base) MCG/ACT inhaler Inhale 2 puffs into the lungs every 6 (six) hours as needed for wheezing or shortness of breath. 18 g 5  . amLODipine (NORVASC) 10 MG tablet TAKE 1 TABLET BY MOUTH EVERY DAY 90 tablet 3  . aspirin 81 MG EC tablet Take 81 mg by mouth daily.      Marland Kitchen atorvastatin (LIPITOR) 20 MG tablet TAKE 1 TABLET BY MOUTH EVERY DAY 90 tablet 3  . chlorthalidone (HYGROTON) 50 MG tablet TAKE 1 TABLET BY MOUTH EVERY DAY 90 tablet 1  . famotidine (PEPCID) 20 MG tablet Take 20 mg by mouth daily.     Marland Kitchen FLUoxetine (PROZAC) 20 MG capsule Take 1 capsule (20 mg total) by mouth daily. 30 capsule 5  . gabapentin (NEURONTIN) 100 MG capsule Take 1 capsule (100 mg total) by mouth 2 (two) times daily. 180 capsule 3  . HYDROcodone-acetaminophen (NORCO) 7.5-325 MG tablet Take 1 tablet by mouth every 6 (six) hours as needed for moderate pain. 60 tablet 0  . lidocaine-prilocaine (EMLA) cream Apply 1 application topically as needed. 30 g 0  . lisinopril (ZESTRIL) 10 MG tablet TAKE 1 TABLET BY MOUTH EVERY DAY 90 tablet 1  . nicotine (NICODERM CQ - DOSED IN MG/24 HOURS)  14 mg/24hr patch PLACE 1 PATCH (14 MG TOTAL) ONTO THE SKIN DAILY. 28 patch 1  . prochlorperazine (COMPAZINE) 10 MG tablet Take 1 tablet (10 mg total) by mouth every 6 (six) hours as needed for nausea or vomiting. 30 tablet 2  . Tiotropium Bromide-Olodaterol (STIOLTO RESPIMAT) 2.5-2.5 MCG/ACT AERS Inhale 2 puffs into the lungs daily. 4 g 5   No current facility-administered medications for this visit.     SURGICAL HISTORY:  Past Surgical History:  Procedure Laterality Date  . ABDOMINAL HYSTERECTOMY    . CHOLECYSTECTOMY    . IR IMAGING GUIDED PORT INSERTION  02/24/2019  . ROTATOR CUFF REPAIR  3/04  . VIDEO BRONCHOSCOPY WITH ENDOBRONCHIAL ULTRASOUND Right 11/25/2018   Procedure: VIDEO BRONCHOSCOPY WITH ENDOBRONCHIAL ULTRASOUND;  Surgeon: Collene Gobble, MD;  Location: [facility] OR;  Service: Thoracic;  Laterality: Right;    REVIEW OF SYSTEMS:   Review of Systems  Constitutional: Negative for appetite change, chills, fatigue, fever and unexpected weight change.  HENT: Negative for mouth sores, nosebleeds, sore throat and trouble swallowing.  Eyes: Negative for diplopia, erythema, or icterus.  Respiratory:Positive for baseline shortness of breath on exertion, wheezing, and cough.Negative for hemoptysis. Cardiovascular:Positive for left sided rib pain (stable).Negative for leg swelling.  Gastrointestinal: Negative for abdominal pain, constipation, diarrhea, nausea and vomiting.  Genitourinary: Negative for bladder incontinence, difficulty urinating, dysuria, frequency and hematuria.  Musculoskeletal: Negative for back pain, gait problem, neck pain and neck stiffness.  Skin: Negative for itching and rash.  Neurological: Positive for mild occasional headaches. Negative for dizziness, extremity weakness, gait problem, light-headedness and seizures.  Hematological: Negative for adenopathy. Does not bruise/bleed easily.  Psychiatric/Behavioral: Negative for confusion, depression and sleep disturbance. The patient is not nervous/anxious.      PHYSICAL EXAMINATION:  Blood pressure 125/84, pulse 65, temperature 98.5 F (36.9 C), temperature source Oral, resp. rate 18, height 5' 1.5" (1.562 m), weight 140 lb 8 oz (63.7 kg), SpO2 100 %.  ECOG PERFORMANCE STATUS: 1 - Symptomatic but completely ambulatory  Physical Exam  Constitutional: Oriented to person, place, and time and well-developed,  well-nourished, and in no distress.  HENT:  Head: Normocephalic and atraumatic.  Mouth/Throat: Oropharynx is clear and moist. No oropharyngeal exudate.  Eyes: Conjunctivae are normal. Right eye exhibits no discharge. Left eye exhibits no discharge. No scleral icterus.  Neck: Normal range of motion. Neck supple.  Cardiovascular: Normal rate, regular rhythm, normal heart sounds and intact distal pulses.   Pulmonary/Chest: Effort normal. Mild wheezing. No respiratory distress. No rales.  Abdominal: Soft. Bowel sounds are normal. Exhibits no distension and no mass. There is no tenderness.  Musculoskeletal: Normal range of motion. Exhibits no edema.  Lymphadenopathy:    No cervical adenopathy.  Neurological: Alert and oriented to person, place, and time. Exhibits normal muscle tone. Gait normal. Coordination normal.  Skin: Skin is warm and dry. No rash noted. Not diaphoretic. No erythema. No pallor.  Psychiatric: Mood, memory and judgment normal.  Vitals reviewed.  LABORATORY DATA: Lab Results  Component Value Date   WBC 4.2 07/07/2019   HGB 12.0 07/07/2019   HCT 36.4 07/07/2019   MCV 97.6 07/07/2019   PLT 222 07/07/2019      Chemistry      Component Value Date/Time   NA 139 07/07/2019 0957   NA 139 07/21/2017 0958   K 3.9 07/07/2019 0957   CL 102 07/07/2019 0957   CO2 29 07/07/2019 0957   BUN 31 (H) 07/07/2019 0957   BUN 26  07/21/2017 0958   CREATININE 1.09 (H) 07/07/2019 0957   CREATININE 0.79 09/03/2013 1540      Component Value Date/Time   CALCIUM 9.9 07/07/2019 0957   ALKPHOS 68 07/07/2019 0957   AST 15 07/07/2019 0957   ALT 10 07/07/2019 0957   BILITOT 0.7 07/07/2019 0957       RADIOGRAPHIC STUDIES:  No results found.   ASSESSMENT/PLAN:  This is a very pleasant 74 year old African-American female diagnosed with stage IV non-small cell lung cancer, squamous cell carcinoma. She presented with a right upper lobe lung mass and pleural based metastases and  mediastinal lymphadenopathy.She was diagnosed in August 2020   The patient is currently undergoing systemicchemotherapy with carboplatin for an AUC of 5, paclitaxel 175 mg/m, and Keytruda 200 mg IV every 3 weeks with Neulasta support.Starting from cycle #5, she is on maintenance with single agent Keytruda.she is status post10cyclesof treatment and she tolerated it wellwithout any concerning adverse effectsexcept for mild fatigue.  Labs were reviewed. Recommend that she proceed with cycle #11 today as scheduled.   We will see her back for a follow up visit in 3 weeks for evaluation before starting cycle #12.   The patient was advised to call immediately if she has any concerning symptoms in the interval. The patient voices understanding of current disease status and treatment options and is in agreement with the current care plan. All questions were answered. The patient knows to call the clinic with any problems, questions or concerns. We can certainly see the patient much sooner if necessary  No orders of the defined types were placed in this encounter.    Debra Gundlach L Seerat Peaden, PA-C 07/07/19

## 2019-07-05 NOTE — Telephone Encounter (Signed)
I wrote an addendum to her 4/8 office note to confirm her compliance, clinical benefit.

## 2019-07-06 NOTE — Telephone Encounter (Signed)
I have sent a high priority CM to [employer] to let them know the OV note has been addended.

## 2019-07-07 ENCOUNTER — Inpatient Hospital Stay: Payer: Medicare HMO

## 2019-07-07 ENCOUNTER — Other Ambulatory Visit: Payer: Self-pay

## 2019-07-07 ENCOUNTER — Inpatient Hospital Stay: Payer: Medicare HMO | Attending: Physician Assistant | Admitting: Physician Assistant

## 2019-07-07 VITALS — BP 125/84 | HR 65 | Temp 98.5°F | Resp 18 | Ht 61.5 in | Wt 140.5 lb

## 2019-07-07 DIAGNOSIS — G4730 Sleep apnea, unspecified: Secondary | ICD-10-CM | POA: Diagnosis not present

## 2019-07-07 DIAGNOSIS — Z95828 Presence of other vascular implants and grafts: Secondary | ICD-10-CM

## 2019-07-07 DIAGNOSIS — F329 Major depressive disorder, single episode, unspecified: Secondary | ICD-10-CM | POA: Diagnosis not present

## 2019-07-07 DIAGNOSIS — Z5112 Encounter for antineoplastic immunotherapy: Secondary | ICD-10-CM | POA: Insufficient documentation

## 2019-07-07 DIAGNOSIS — Z9221 Personal history of antineoplastic chemotherapy: Secondary | ICD-10-CM | POA: Diagnosis not present

## 2019-07-07 DIAGNOSIS — J449 Chronic obstructive pulmonary disease, unspecified: Secondary | ICD-10-CM | POA: Insufficient documentation

## 2019-07-07 DIAGNOSIS — C3491 Malignant neoplasm of unspecified part of right bronchus or lung: Secondary | ICD-10-CM

## 2019-07-07 DIAGNOSIS — R51 Headache with orthostatic component, not elsewhere classified: Secondary | ICD-10-CM | POA: Diagnosis not present

## 2019-07-07 DIAGNOSIS — Z7982 Long term (current) use of aspirin: Secondary | ICD-10-CM | POA: Diagnosis not present

## 2019-07-07 DIAGNOSIS — R5383 Other fatigue: Secondary | ICD-10-CM

## 2019-07-07 DIAGNOSIS — Z79899 Other long term (current) drug therapy: Secondary | ICD-10-CM | POA: Insufficient documentation

## 2019-07-07 DIAGNOSIS — C782 Secondary malignant neoplasm of pleura: Secondary | ICD-10-CM | POA: Insufficient documentation

## 2019-07-07 DIAGNOSIS — C3411 Malignant neoplasm of upper lobe, right bronchus or lung: Secondary | ICD-10-CM | POA: Insufficient documentation

## 2019-07-07 LAB — CMP (CANCER CENTER ONLY)
ALT: 10 U/L (ref 0–44)
AST: 15 U/L (ref 15–41)
Albumin: 4 g/dL (ref 3.5–5.0)
Alkaline Phosphatase: 68 U/L (ref 38–126)
Anion gap: 8 (ref 5–15)
BUN: 31 mg/dL — ABNORMAL HIGH (ref 8–23)
CO2: 29 mmol/L (ref 22–32)
Calcium: 9.9 mg/dL (ref 8.9–10.3)
Chloride: 102 mmol/L (ref 98–111)
Creatinine: 1.09 mg/dL — ABNORMAL HIGH (ref 0.44–1.00)
GFR, Est AFR Am: 59 mL/min — ABNORMAL LOW (ref >60)
GFR, Estimated: 51 mL/min — ABNORMAL LOW (ref >60)
Glucose, Bld: 85 mg/dL (ref 70–99)
Potassium: 3.9 mmol/L (ref 3.5–5.1)
Sodium: 139 mmol/L (ref 135–145)
Total Bilirubin: 0.7 mg/dL (ref 0.3–1.2)
Total Protein: 8.1 g/dL (ref 6.5–8.1)

## 2019-07-07 LAB — CBC WITH DIFFERENTIAL (CANCER CENTER ONLY)
Abs Immature Granulocytes: 0.01 10*3/uL (ref 0.00–0.07)
Basophils Absolute: 0.1 10*3/uL (ref 0.0–0.1)
Basophils Relative: 1 %
Eosinophils Absolute: 0.6 10*3/uL — ABNORMAL HIGH (ref 0.0–0.5)
Eosinophils Relative: 14 %
HCT: 36.4 % (ref 36.0–46.0)
Hemoglobin: 12 g/dL (ref 12.0–15.0)
Immature Granulocytes: 0 %
Lymphocytes Relative: 34 %
Lymphs Abs: 1.4 10*3/uL (ref 0.7–4.0)
MCH: 32.2 pg (ref 26.0–34.0)
MCHC: 33 g/dL (ref 30.0–36.0)
MCV: 97.6 fL (ref 80.0–100.0)
Monocytes Absolute: 0.5 10*3/uL (ref 0.1–1.0)
Monocytes Relative: 11 %
Neutro Abs: 1.7 10*3/uL (ref 1.7–7.7)
Neutrophils Relative %: 40 %
Platelet Count: 222 10*3/uL (ref 150–400)
RBC: 3.73 MIL/uL — ABNORMAL LOW (ref 3.87–5.11)
RDW: 13.4 % (ref 11.5–15.5)
WBC Count: 4.2 10*3/uL (ref 4.0–10.5)
nRBC: 0 % (ref 0.0–0.2)

## 2019-07-07 LAB — TSH: TSH: 1.706 u[IU]/mL (ref 0.308–3.960)

## 2019-07-07 MED ORDER — SODIUM CHLORIDE 0.9% FLUSH
10.0000 mL | INTRAVENOUS | Status: DC | PRN
  Administered 2019-07-07: 10 mL
  Filled 2019-07-07: qty 10

## 2019-07-07 MED ORDER — SODIUM CHLORIDE 0.9 % IV SOLN
Freq: Once | INTRAVENOUS | Status: AC
  Filled 2019-07-07: qty 250

## 2019-07-07 MED ORDER — SODIUM CHLORIDE 0.9 % IV SOLN
200.0000 mg | Freq: Once | INTRAVENOUS | Status: AC
  Administered 2019-07-07: 200 mg via INTRAVENOUS
  Filled 2019-07-07: qty 8

## 2019-07-07 MED ORDER — HEPARIN SOD (PORK) LOCK FLUSH 100 UNIT/ML IV SOLN
500.0000 [IU] | Freq: Once | INTRAVENOUS | Status: AC | PRN
  Administered 2019-07-07: 500 [IU]
  Filled 2019-07-07: qty 5

## 2019-07-07 NOTE — Patient Instructions (Signed)
[facility] Discharge Instructions for Patients Receiving Chemotherapy  Today you received the following chemotherapy agents:  Keytruda.  To help prevent nausea and vomiting after your treatment, we encourage you to take your nausea medication as directed.   If you develop nausea and vomiting that is not controlled by your nausea medication, call the clinic.   BELOW ARE SYMPTOMS THAT SHOULD BE REPORTED IMMEDIATELY:  *FEVER GREATER THAN 100.5 F  *CHILLS WITH OR WITHOUT FEVER  NAUSEA AND VOMITING THAT IS NOT CONTROLLED WITH YOUR NAUSEA MEDICATION  *UNUSUAL SHORTNESS OF BREATH  *UNUSUAL BRUISING OR BLEEDING  TENDERNESS IN MOUTH AND THROAT WITH OR WITHOUT PRESENCE OF ULCERS  *URINARY PROBLEMS  *BOWEL PROBLEMS  UNUSUAL RASH Items with * indicate a potential emergency and should be followed up as soon as possible.  Feel free to call the clinic should you have any questions or concerns. The clinic phone number is [phone].  Please show the CHEMO ALERT CARD at check-in to the Emergency Department and triage nurse.    

## 2019-07-12 ENCOUNTER — Telehealth: Payer: Self-pay | Admitting: *Deleted

## 2019-07-12 ENCOUNTER — Other Ambulatory Visit: Payer: Self-pay | Admitting: Physician Assistant

## 2019-07-12 ENCOUNTER — Encounter: Payer: Self-pay | Admitting: Student in an Organized Health Care Education/Training Program

## 2019-07-12 ENCOUNTER — Ambulatory Visit (INDEPENDENT_AMBULATORY_CARE_PROVIDER_SITE_OTHER): Payer: Medicare HMO | Admitting: Student in an Organized Health Care Education/Training Program

## 2019-07-12 VITALS — BP 124/66 | HR 70 | Temp 97.5°F | Ht 61.5 in | Wt 143.0 lb

## 2019-07-12 DIAGNOSIS — J439 Emphysema, unspecified: Secondary | ICD-10-CM

## 2019-07-12 DIAGNOSIS — I8290 Acute embolism and thrombosis of unspecified vein: Secondary | ICD-10-CM

## 2019-07-12 DIAGNOSIS — I82409 Acute embolism and thrombosis of unspecified deep veins of unspecified lower extremity: Secondary | ICD-10-CM

## 2019-07-12 DIAGNOSIS — I10 Essential (primary) hypertension: Secondary | ICD-10-CM

## 2019-07-12 DIAGNOSIS — Z7189 Other specified counseling: Secondary | ICD-10-CM | POA: Diagnosis not present

## 2019-07-12 HISTORY — DX: Acute embolism and thrombosis of unspecified deep veins of unspecified lower extremity: I82.409

## 2019-07-12 MED ORDER — RIVAROXABAN (XARELTO) VTE STARTER PACK (15 & 20 MG): ORAL_TABLET | ORAL | 0 refills | Status: DC

## 2019-07-12 NOTE — Assessment & Plan Note (Signed)
Noticed today that the CT of the chest that was done in March for restaging after chemotherapy had an incidental finding of a nonocclusive small thrombus in the right internal jugular vein associated with the insertion site of the port central line.  She has no symptoms of DVT.  I am going to order an ultrasound of the right jugular vein to see if this small thrombus has resolved or progressed.  If the thrombus remains, would favor anticoagulation, she is average bleeding risk.  She is on aspirin for primary prevention of ischemic vascular disease but this could be discontinued to lower bleeding risk.

## 2019-07-12 NOTE — Assessment & Plan Note (Signed)
Blood pressure well controlled today. Plan to continue with chlorthalidone 7m, lisinopril 152m and amlodipine 1054maily. Labs looks good in March.

## 2019-07-12 NOTE — Telephone Encounter (Signed)
Per [employer], PA called patient to advise that from last CT small clot was found and she needs to stop her Aspirin 81 mg and start new Rx of Xarelto.  Educated patient on importance of stopping Aspirin and NSAIDS and potential side effects that we would need to be notified of.  No further questions.

## 2019-07-12 NOTE — Assessment & Plan Note (Signed)
Successfully quit smoking in September, unfortunately this was in response to the new diagnosis of lung cancer.  Respiratory status has been good, she has a good exertional capacity, no recent exacerbations.  We will continue with Stiolot inhaler (LAMA-LABA) in addition to as needed albuterol.

## 2019-07-12 NOTE — Progress Notes (Signed)
   Assessment and Plan:  See Encounters tab for problem-based medical decision making.   __________________________________________________________  HPI:   74 year old person living with stage IV squamous cell lung cancer presenting for follow-up of hypertension.  Patient was diagnosed with lung cancer in September, she had imaging to evaluate for right-sided rib pain and found to have several pleural-based right lung masses along with mediastinal lymphadenopathy.  Bronchoscopy showed squamous cell carcinoma.  She is being managed by Dr. Earlie Server, completed chemotherapy with carboplatin and paclitaxel and is now on immunotherapy with pembrolizumab every 3 weeks.  She has a right chest port which she reports is working well with no problems.  She lives at home with her husband and is independent in all her activities of daily living.  She has other family members in [place] who offer support.  She is a very private person and has remained pretty isolated through the pandemic.  She received her first dose of Covid vaccine about 1 week ago.  __________________________________________________________  Problem List: Patient Active Problem List   Diagnosis Date Noted  . Stage IV squamous cell carcinoma of right lung ([place]) 12/02/2018    Priority: High  . Abdominal aortic aneurysm (AAA) 35 to 39 mm in diameter ([place]) 11/05/2018    Priority: High  . COPD (chronic obstructive pulmonary disease) ([place]) 09/23/2016    Priority: High  . Essential hypertension 04/17/2015    Priority: High  . Major depressive disorder, recurrent episode ([place]) 04/17/2015    Priority: High  . Glucose intolerance 10/02/2015    Priority: Medium  . Adenomatous colon polyp 10/02/2015    Priority: Medium  . Tobacco use 04/17/2015    Priority: Medium  . Osteoarthritis cervical spine 05/26/2013    Priority: Medium  . GERD 06/25/2007    Priority: Medium  . Goals of care, counseling/discussion 12/02/2018    Priority: Low   . BPPV (benign paroxysmal positional vertigo) 12/02/2016    Priority: Low  . OSA (obstructive sleep apnea) 10/02/2015    Priority: Low  . Hyperlipidemia 07/29/2006    Priority: Low  . Port-A-Cath in place 07/07/2019  . Cancer associated pain 12/30/2018  . Constipation 12/16/2018  . Encounter for antineoplastic chemotherapy 12/02/2018  . Encounter for antineoplastic immunotherapy 12/02/2018    Medications: Reconciled today in Epic __________________________________________________________  Physical Exam:  Vital Signs: Vitals:   07/12/19 1011  BP: 124/66  Pulse: 70  Temp: (!) 97.5 F (36.4 C)  TempSrc: Oral  SpO2: 100%  Weight: 143 lb (64.9 kg)  Height: 5' 1.5" (1.562 m)    Gen: Well appearing, NAD Neck: No cervical LAD, No thyromegaly or nodules, No JVD. CV: RRR, no murmurs Pulm: Normal effort,  right-sided end expiratory wheezing, otherwise clear Ext: Warm, no edema, normal joints

## 2019-07-14 ENCOUNTER — Ambulatory Visit (HOSPITAL_COMMUNITY)
Admission: RE | Admit: 2019-07-14 | Discharge: 2019-07-14 | Disposition: A | Discharge status: Home / Self Care | Payer: Medicare HMO | Source: Ambulatory Visit | Attending: Student in an Organized Health Care Education/Training Program | Admitting: Student in an Organized Health Care Education/Training Program

## 2019-07-14 ENCOUNTER — Other Ambulatory Visit: Payer: Self-pay

## 2019-07-14 DIAGNOSIS — I8290 Acute embolism and thrombosis of unspecified vein: Secondary | ICD-10-CM | POA: Diagnosis present

## 2019-07-14 DIAGNOSIS — I82401 Acute embolism and thrombosis of unspecified deep veins of right lower extremity: Secondary | ICD-10-CM

## 2019-07-14 NOTE — Progress Notes (Signed)
Right upper extremity venous duplex completed. Refer to "CV Proc" under chart review to view preliminary results.  07/14/2019 2:21 PM Kelby Aline., MHA, RVT, RDCS, RDMS

## 2019-07-19 ENCOUNTER — Other Ambulatory Visit: Payer: Self-pay | Admitting: *Deleted

## 2019-07-19 DIAGNOSIS — I10 Essential (primary) hypertension: Secondary | ICD-10-CM

## 2019-07-19 MED ORDER — ATORVASTATIN CALCIUM 20 MG PO TABS: 20.0000 mg | ORAL_TABLET | Freq: Every day | ORAL | 3 refills | Status: DC

## 2019-07-19 MED ORDER — FLUOXETINE HCL 20 MG PO CAPS: 20.0000 mg | ORAL_CAPSULE | Freq: Every day | ORAL | 5 refills | Status: DC

## 2019-07-19 MED ORDER — AMLODIPINE BESYLATE 10 MG PO TABS: 10.0000 mg | ORAL_TABLET | Freq: Every day | ORAL | 3 refills | Status: DC

## 2019-07-19 MED ORDER — LISINOPRIL 10 MG PO TABS: 10.0000 mg | ORAL_TABLET | Freq: Every day | ORAL | 1 refills | Status: DC

## 2019-07-19 MED ORDER — CHLORTHALIDONE 50 MG PO TABS: 50.0000 mg | ORAL_TABLET | Freq: Every day | ORAL | 1 refills | Status: DC

## 2019-07-22 NOTE — Progress Notes (Signed)
Pharmacist Chemotherapy Monitoring - Follow Up Assessment    I verify that I have reviewed each item in the below checklist:  . Regimen for the patient is scheduled for the appropriate day and plan matches scheduled date. Marland Kitchen Appropriate non-routine labs are ordered dependent on drug ordered. . If applicable, additional medications reviewed and ordered per protocol based on lifetime cumulative doses and/or treatment regimen.   Plan for follow-up and/or issues identified: No . I-vent associated with next due treatment: No . MD and/or nursing notified: No  Amadea Keagy K 07/22/2019 11:18 AM

## 2019-07-23 ENCOUNTER — Other Ambulatory Visit: Payer: Self-pay

## 2019-07-23 MED ORDER — ALBUTEROL SULFATE HFA 108 (90 BASE) MCG/ACT IN AERS: 2.0000 | INHALATION_SPRAY | Freq: Four times a day (QID) | RESPIRATORY_TRACT | 2 refills | Status: DC | PRN

## 2019-07-26 NOTE — Progress Notes (Signed)
[facility] OFFICE PROGRESS NOTE  [name], MD [address] [place] [address]  DIAGNOSIS: Stage IV non-small cell lung cancer, squamous cell carcinoma. She presented withright upper lobe lung mass in addition to pleural-based metastasis andmediastinal lymphadenopathy.She was diagnosed in September 2020.  PRIOR THERAPY: None  CURRENT THERAPY: Chemotherapy with carboplatin for an AUC of 5,paclitaxel 175 mg/m, and Keytruda 200 mg IV every 3 weeks with Neulasta support. Firsttreatmenton 12/09/2018.Status post11cycles. Starting from cycle #5 she is currently on maintenance treatment with single agent Keytruda every 3 weeks.  INTERVAL HISTORY: Debra Rodgers 74 y.o. female returns to the clinic for a follow up visit. The patient is feeling well today without any concerning complaints. The patient continues to tolerate treatment with single agent Keytruda well without any adverse effects. Denies any fever, chills, night sweats, or weight loss. Denies any chest pain or hemoptysis but reports baseline dyspnea on exertion and her mildly productive cough. She has been using [employer] brand allergy medications and cough medication. She also notes associated watery eyes and mild nasal congestion. She is prescribed inhalers for her COPD/wheezing. Denies any nausea, vomiting, diarrhea, or constipation. Denies any headache or visual changes. Denies any rashes or skin changes. The patient is here today for evaluation prior to starting cycle # 12  MEDICAL HISTORY: Past Medical History:  Diagnosis Date  . COPD (chronic obstructive pulmonary disease) ([place])   . Depression   . Essential hypertension   . Headache   . History of migraine headaches   . lung ca dx'd 10/2018  . Sleep apnea   . Tobacco use disorder     ALLERGIES:  is allergic to codeine sulfate and pantoprazole sodium.  MEDICATIONS:  Current Outpatient Medications  Medication Sig Dispense Refill   . acetaminophen (TYLENOL) 325 MG tablet Take 2 tablets (650 mg total) by mouth every 6 (six) hours as needed. 30 tablet 0  . albuterol (VENTOLIN HFA) 108 (90 Base) MCG/ACT inhaler Inhale 2 puffs into the lungs every 6 (six) hours as needed for wheezing or shortness of breath. 36 g 2  . amLODipine (NORVASC) 10 MG tablet Take 1 tablet (10 mg total) by mouth daily. 90 tablet 3  . atorvastatin (LIPITOR) 20 MG tablet Take 1 tablet (20 mg total) by mouth daily. 90 tablet 3  . chlorthalidone (HYGROTON) 50 MG tablet Take 1 tablet (50 mg total) by mouth daily. 90 tablet 1  . famotidine (PEPCID) 20 MG tablet Take 20 mg by mouth daily.     Marland Kitchen FLUoxetine (PROZAC) 20 MG capsule Take 1 capsule (20 mg total) by mouth daily. 30 capsule 5  . HYDROcodone-acetaminophen (NORCO) 7.5-325 MG tablet Take 1 tablet by mouth every 6 (six) hours as needed for moderate pain. 60 tablet 0  . lidocaine-prilocaine (EMLA) cream Apply 1 application topically as needed. 30 g 0  . lisinopril (ZESTRIL) 10 MG tablet Take 1 tablet (10 mg total) by mouth daily. 90 tablet 1  . nicotine (NICODERM CQ - DOSED IN MG/24 HOURS) 14 mg/24hr patch PLACE 1 PATCH (14 MG TOTAL) ONTO THE SKIN DAILY. 28 patch 1  . prochlorperazine (COMPAZINE) 10 MG tablet Take 1 tablet (10 mg total) by mouth every 6 (six) hours as needed for nausea or vomiting. 30 tablet 2  . RIVAROXABAN (XARELTO) VTE STARTER PACK (15 & 20 MG TABLETS) Follow package directions: Take one 35m tablet by mouth twice a day. On day 22, switch to one 264mtablet once a day.  Take with food. 51 each 0  . Tiotropium Bromide-Olodaterol (STIOLTO RESPIMAT) 2.5-2.5 MCG/ACT AERS Inhale 2 puffs into the lungs daily. 4 g 5  . potassium chloride 20 MEQ TBCR Take 20 mEq by mouth daily. 10 tablet 0   No current facility-administered medications for this visit.    SURGICAL HISTORY:  Past Surgical History:  Procedure Laterality Date  . ABDOMINAL HYSTERECTOMY    . CHOLECYSTECTOMY    . IR IMAGING  GUIDED PORT INSERTION  02/24/2019  . ROTATOR CUFF REPAIR  3/04  . VIDEO BRONCHOSCOPY WITH ENDOBRONCHIAL ULTRASOUND Right 11/25/2018   Procedure: VIDEO BRONCHOSCOPY WITH ENDOBRONCHIAL ULTRASOUND;  Surgeon: Collene Gobble, MD;  Location: [facility] OR;  Service: Thoracic;  Laterality: Right;    REVIEW OF SYSTEMS:   Constitutional: Negative for appetite change, chills, fatigue, fever and unexpected weight change.  HENT: Negative for mouth sores, nosebleeds, sore throat and trouble swallowing.  Eyes: Negative fordiplopia, erythema, oricterus.  Respiratory:Positive for baseline shortness of breath on exertion, wheezing, and cough.Negative for hemoptysis. Cardiovascular:Negative for leg swelling.  Gastrointestinal: Negative for abdominal pain, constipation, diarrhea, nausea and vomiting.  Genitourinary: Negative for bladder incontinence, difficulty urinating, dysuria, frequency and hematuria.  Musculoskeletal: Negative for back pain, gait problem, neck pain and neck stiffness.  Skin: Negative for itching and rash.  Neurological:Positive for mild occasional headaches.Negative for dizziness, extremity weakness, gait problem, light-headedness and seizures.  Hematological: Negative for adenopathy. Does not bruise/bleed easily.  Psychiatric/Behavioral: Negative for confusion, depression and sleep disturbance. The patient is not nervous/anxious.    PHYSICAL EXAMINATION:  Blood pressure 120/78, pulse 80, temperature 98.7 F (37.1 C), temperature source Temporal, resp. rate 17, height 5' 1.5" (1.562 m), weight 141 lb 1.6 oz (64 kg), SpO2 99 %.  ECOG PERFORMANCE STATUS: 1 - Symptomatic but completely ambulatory  Physical Exam  Constitutional: Oriented to person, place, and time and well-developed, well-nourished, and in no distress.  HENT:  Head: Normocephalic and atraumatic.  Mouth/Throat: Oropharynx is clear and moist. No oropharyngeal exudate.  Eyes: Conjunctivae are normal. Right eye exhibits no  discharge. Left eye exhibits no discharge. No scleral icterus.  Neck: Normal range of motion. Neck supple.  Cardiovascular: Normal rate, regular rhythm, normal heart sounds and intact distal pulses.   Pulmonary/Chest: Effort normal. Wheezing in all lung fields. No respiratory distress. No rales.  Abdominal: Soft. Bowel sounds are normal. Exhibits no distension and no mass. There is no tenderness.  Musculoskeletal: Normal range of motion. Exhibits no edema.  Lymphadenopathy:    No cervical adenopathy.  Neurological: Alert and oriented to person, place, and time. Exhibits normal muscle tone. Gait normal. Coordination normal.  Skin: Skin is warm and dry. No rash noted. Not diaphoretic. No erythema. No pallor.  Psychiatric: Mood, memory and judgment normal.  Vitals reviewed.  LABORATORY DATA: Lab Results  Component Value Date   WBC 3.9 (L) 07/28/2019   HGB 11.9 (L) 07/28/2019   HCT 36.1 07/28/2019   MCV 96.3 07/28/2019   PLT 226 07/28/2019      Chemistry      Component Value Date/Time   NA 141 07/28/2019 1015   NA 139 07/21/2017 0958   K 3.0 (LL) 07/28/2019 1015   CL 104 07/28/2019 1015   CO2 27 07/28/2019 1015   BUN 24 (H) 07/28/2019 1015   BUN 26 07/21/2017 0958   CREATININE 1.22 (H) 07/28/2019 1015   CREATININE 0.79 09/03/2013 1540      Component Value Date/Time   CALCIUM 9.6 07/28/2019 1015   ALKPHOS  60 07/28/2019 1015   AST 12 (L) 07/28/2019 1015   ALT 10 07/28/2019 1015   BILITOT 0.9 07/28/2019 1015       RADIOGRAPHIC STUDIES:  VAS [place] UPPER EXTREMITY VENOUS DUPLEX  Result Date: 07/14/2019 UPPER VENOUS STUDY  Indications: f/u thrombus seen on CT Comparison Study: No prior study Performing Technologist: Maudry Mayhew MHA, RDMS, RVT, RDCS  Examination Guidelines: A complete evaluation includes B-mode imaging, spectral Doppler, color Doppler, and power Doppler as needed of all accessible portions of each vessel. Bilateral testing is considered an integral part of  a complete examination. Limited examinations for reoccurring indications may be performed as noted.  Right Findings: +----------+------------+---------+-----------+----------+-------+ RIGHT     CompressiblePhasicitySpontaneousPropertiesSummary +----------+------------+---------+-----------+----------+-------+ IJV           Full       Yes       Yes                      +----------+------------+---------+-----------+----------+-------+ Subclavian    Full       Yes       Yes                      +----------+------------+---------+-----------+----------+-------+ Axillary      Full       Yes       Yes                      +----------+------------+---------+-----------+----------+-------+ Brachial      Full       Yes       Yes                      +----------+------------+---------+-----------+----------+-------+ Radial        Full                                          +----------+------------+---------+-----------+----------+-------+ Ulnar         Full                                          +----------+------------+---------+-----------+----------+-------+ Cephalic      Full                                          +----------+------------+---------+-----------+----------+-------+ Basilic       Full                                          +----------+------------+---------+-----------+----------+-------+  Left Findings: +----------+------------+---------+-----------+----------+-------+ LEFT      CompressiblePhasicitySpontaneousPropertiesSummary +----------+------------+---------+-----------+----------+-------+ Subclavian               Yes       Yes                      +----------+------------+---------+-----------+----------+-------+  Summary:  Right: No evidence of deep vein thrombosis in the upper extremity. No evidence of superficial vein thrombosis in the upper extremity.  Left: No evidence of thrombosis in the subclavian.  *See  table(s) above for measurements and observations.  Diagnosing  physician: Curt Jews MD Electronically signed by Curt Jews MD on 07/14/2019 at 3:32:42 PM.    Final      ASSESSMENT/PLAN:  This is a very pleasant 74 year old African-American female diagnosed with stage IV non-small cell lung cancer, squamous cell carcinoma. She presented with a right upper lobe lung mass and pleural based metastases and mediastinal lymphadenopathy.She was diagnosed in August 2020   The patient is currently undergoing systemicchemotherapy with carboplatin for an AUC of 5, paclitaxel 175 mg/m, and Keytruda 200 mg IV every 3 weeks with Neulasta support.Starting from cycle #5, she is on maintenance with single agent Keytruda.she is status post11cyclesof treatment and she tolerated it wellwithout any concerning adverse effectsexcept for mild fatigue.  Labs were reviewed. Recommend that she proceed with cycle #12 today as scheduled.  I will arrange for a restaging CT scan prior to starting her next cycle of treatment. She will arrive 2 hours early to drink the watered down contrast.   Her potassium is low today at 3.0. I have sent a prescription for KCl to her pharmacy for 10 days.    We will see her back for a follow up visit in 3 weeks for evaluation and to review her scanbefore starting cycle #13.  She will be on xarelto for 3 months. Advised her to call us when she runs out of her starter pack so I can refill the maintenance pack with 1 refill.   The patient notes mild cough and allergies. Advised the patient to call if she develops new or worsening symptoms including fevers, sore throat, worsening cough, shortness of breath, etc. We will evaluate her lungs on her upcoming imaging studies.   The patient was advised to call immediately if she has any concerning symptoms in the interval. The patient voices understanding of current disease status and treatment options and is in agreement with the  current care plan. All questions were answered. The patient knows to call the clinic with any problems, questions or concerns. We can certainly see the patient much sooner if necessary    Orders Placed This Encounter  Procedures  . CT Chest W Contrast    Standing Status:   Future    Standing Expiration Date:   07/27/2020    Order Specific Question:   ** REASON FOR EXAM (FREE TEXT)    Answer:   Restaging Lung Cancer and response to treatment    Order Specific Question:   If indicated for the ordered procedure, I authorize the administration of contrast media per Radiology protocol    Answer:   Yes    Order Specific Question:   Preferred imaging location?    Answer:   [facility]    Order Specific Question:   Radiology Contrast Protocol - do NOT remove file path    Answer:   \\[email or web address]  . CT Abdomen Pelvis W Contrast    Standing Status:   Future    Standing Expiration Date:   07/27/2020    Order Specific Question:   ** REASON FOR EXAM (FREE TEXT)    Answer:   Restaging Lung Cancer and assessing response to treatment    Order Specific Question:   If indicated for the ordered procedure, I authorize the administration of contrast media per Radiology protocol    Answer:   Yes    Order Specific Question:   Preferred imaging location?    Answer:   [facility]    Order Specific Question:   Is Oral Contrast  requested for this exam?    Answer:   Yes, Per Radiology protocol    Order Specific Question:   Radiology Contrast Protocol - do NOT remove file path    Answer:   \\[email or web address]     [place], PA-C 07/28/19

## 2019-07-28 ENCOUNTER — Other Ambulatory Visit: Payer: Self-pay

## 2019-07-28 ENCOUNTER — Encounter: Payer: Self-pay | Admitting: Physician Assistant

## 2019-07-28 ENCOUNTER — Inpatient Hospital Stay: Payer: Medicare HMO

## 2019-07-28 ENCOUNTER — Inpatient Hospital Stay: Payer: Medicare HMO | Attending: Physician Assistant | Admitting: Physician Assistant

## 2019-07-28 VITALS — BP 120/78 | HR 80 | Temp 98.7°F | Resp 17 | Ht 61.5 in | Wt 141.1 lb

## 2019-07-28 DIAGNOSIS — I10 Essential (primary) hypertension: Secondary | ICD-10-CM | POA: Diagnosis not present

## 2019-07-28 DIAGNOSIS — Z5112 Encounter for antineoplastic immunotherapy: Secondary | ICD-10-CM | POA: Diagnosis not present

## 2019-07-28 DIAGNOSIS — Z1152 Encounter for screening for COVID-19: Secondary | ICD-10-CM | POA: Insufficient documentation

## 2019-07-28 DIAGNOSIS — Z7901 Long term (current) use of anticoagulants: Secondary | ICD-10-CM | POA: Insufficient documentation

## 2019-07-28 DIAGNOSIS — R0981 Nasal congestion: Secondary | ICD-10-CM | POA: Insufficient documentation

## 2019-07-28 DIAGNOSIS — Z95828 Presence of other vascular implants and grafts: Secondary | ICD-10-CM

## 2019-07-28 DIAGNOSIS — R5383 Other fatigue: Secondary | ICD-10-CM

## 2019-07-28 DIAGNOSIS — G4730 Sleep apnea, unspecified: Secondary | ICD-10-CM | POA: Diagnosis not present

## 2019-07-28 DIAGNOSIS — F329 Major depressive disorder, single episode, unspecified: Secondary | ICD-10-CM | POA: Diagnosis not present

## 2019-07-28 DIAGNOSIS — J449 Chronic obstructive pulmonary disease, unspecified: Secondary | ICD-10-CM | POA: Diagnosis not present

## 2019-07-28 DIAGNOSIS — Z79899 Other long term (current) drug therapy: Secondary | ICD-10-CM | POA: Diagnosis not present

## 2019-07-28 DIAGNOSIS — C3491 Malignant neoplasm of unspecified part of right bronchus or lung: Secondary | ICD-10-CM

## 2019-07-28 DIAGNOSIS — K7689 Other specified diseases of liver: Secondary | ICD-10-CM | POA: Insufficient documentation

## 2019-07-28 DIAGNOSIS — E876 Hypokalemia: Secondary | ICD-10-CM

## 2019-07-28 DIAGNOSIS — F17210 Nicotine dependence, cigarettes, uncomplicated: Secondary | ICD-10-CM | POA: Insufficient documentation

## 2019-07-28 DIAGNOSIS — C3411 Malignant neoplasm of upper lobe, right bronchus or lung: Secondary | ICD-10-CM | POA: Insufficient documentation

## 2019-07-28 LAB — CMP (CANCER CENTER ONLY)
ALT: 10 U/L (ref 0–44)
AST: 12 U/L — ABNORMAL LOW (ref 15–41)
Albumin: 3.8 g/dL (ref 3.5–5.0)
Alkaline Phosphatase: 60 U/L (ref 38–126)
Anion gap: 10 (ref 5–15)
BUN: 24 mg/dL — ABNORMAL HIGH (ref 8–23)
CO2: 27 mmol/L (ref 22–32)
Calcium: 9.6 mg/dL (ref 8.9–10.3)
Chloride: 104 mmol/L (ref 98–111)
Creatinine: 1.22 mg/dL — ABNORMAL HIGH (ref 0.44–1.00)
GFR, Est AFR Am: 51 mL/min — ABNORMAL LOW (ref >60)
GFR, Estimated: 44 mL/min — ABNORMAL LOW (ref >60)
Glucose, Bld: 126 mg/dL — ABNORMAL HIGH (ref 70–99)
Potassium: 3 mmol/L — CL (ref 3.5–5.1)
Sodium: 141 mmol/L (ref 135–145)
Total Bilirubin: 0.9 mg/dL (ref 0.3–1.2)
Total Protein: 7.9 g/dL (ref 6.5–8.1)

## 2019-07-28 LAB — CBC WITH DIFFERENTIAL (CANCER CENTER ONLY)
Abs Immature Granulocytes: 0.01 10*3/uL (ref 0.00–0.07)
Basophils Absolute: 0 10*3/uL (ref 0.0–0.1)
Basophils Relative: 1 %
Eosinophils Absolute: 0.6 10*3/uL — ABNORMAL HIGH (ref 0.0–0.5)
Eosinophils Relative: 14 %
HCT: 36.1 % (ref 36.0–46.0)
Hemoglobin: 11.9 g/dL — ABNORMAL LOW (ref 12.0–15.0)
Immature Granulocytes: 0 %
Lymphocytes Relative: 28 %
Lymphs Abs: 1.1 10*3/uL (ref 0.7–4.0)
MCH: 31.7 pg (ref 26.0–34.0)
MCHC: 33 g/dL (ref 30.0–36.0)
MCV: 96.3 fL (ref 80.0–100.0)
Monocytes Absolute: 0.3 10*3/uL (ref 0.1–1.0)
Monocytes Relative: 8 %
Neutro Abs: 1.9 10*3/uL (ref 1.7–7.7)
Neutrophils Relative %: 49 %
Platelet Count: 226 10*3/uL (ref 150–400)
RBC: 3.75 MIL/uL — ABNORMAL LOW (ref 3.87–5.11)
RDW: 13.6 % (ref 11.5–15.5)
WBC Count: 3.9 10*3/uL — ABNORMAL LOW (ref 4.0–10.5)
nRBC: 0 % (ref 0.0–0.2)

## 2019-07-28 LAB — TSH: TSH: 1.208 u[IU]/mL (ref 0.308–3.960)

## 2019-07-28 MED ORDER — HEPARIN SOD (PORK) LOCK FLUSH 100 UNIT/ML IV SOLN
500.0000 [IU] | Freq: Once | INTRAVENOUS | Status: AC | PRN
  Administered 2019-07-28: 500 [IU]
  Filled 2019-07-28: qty 5

## 2019-07-28 MED ORDER — SODIUM CHLORIDE 0.9% FLUSH
10.0000 mL | INTRAVENOUS | Status: DC | PRN
  Administered 2019-07-28: 10 mL
  Filled 2019-07-28: qty 10

## 2019-07-28 MED ORDER — LIDOCAINE-PRILOCAINE 2.5-2.5 % EX CREA: 1.0000 "application " | TOPICAL_CREAM | CUTANEOUS | 0 refills | Status: DC | PRN

## 2019-07-28 MED ORDER — SODIUM CHLORIDE 0.9 % IV SOLN
Freq: Once | INTRAVENOUS | Status: AC
  Filled 2019-07-28: qty 250

## 2019-07-28 MED ORDER — POTASSIUM CHLORIDE ER 20 MEQ PO TBCR
20.0000 meq | EXTENDED_RELEASE_TABLET | Freq: Every day | ORAL | 0 refills | Status: DC
Start: 2019-07-28 — End: 2019-09-08

## 2019-07-28 MED ORDER — SODIUM CHLORIDE 0.9 % IV SOLN
200.0000 mg | Freq: Once | INTRAVENOUS | Status: AC
  Administered 2019-07-28: 200 mg via INTRAVENOUS
  Filled 2019-07-28: qty 8

## 2019-07-28 NOTE — Patient Instructions (Signed)
[facility] Discharge Instructions for Patients Receiving Chemotherapy  Today you received the following chemotherapy agents:  Keytruda.  To help prevent nausea and vomiting after your treatment, we encourage you to take your nausea medication as directed.   If you develop nausea and vomiting that is not controlled by your nausea medication, call the clinic.   BELOW ARE SYMPTOMS THAT SHOULD BE REPORTED IMMEDIATELY:  *FEVER GREATER THAN 100.5 F  *CHILLS WITH OR WITHOUT FEVER  NAUSEA AND VOMITING THAT IS NOT CONTROLLED WITH YOUR NAUSEA MEDICATION  *UNUSUAL SHORTNESS OF BREATH  *UNUSUAL BRUISING OR BLEEDING  TENDERNESS IN MOUTH AND THROAT WITH OR WITHOUT PRESENCE OF ULCERS  *URINARY PROBLEMS  *BOWEL PROBLEMS  UNUSUAL RASH Items with * indicate a potential emergency and should be followed up as soon as possible.  Feel free to call the clinic should you have any questions or concerns. The clinic phone number is [phone].  Please show the CHEMO ALERT CARD at check-in to the Emergency Department and triage nurse.    

## 2019-07-28 NOTE — Progress Notes (Signed)
Received critical value call from lab of Potassium 3.0.  Took value and hand delivered to [employer], [place].  She stated that she will call in potassium to the pharmacy for this patient.  Gardiner Rhyme, RN

## 2019-07-30 ENCOUNTER — Telehealth: Payer: Self-pay | Admitting: Physician Assistant

## 2019-07-30 NOTE — Telephone Encounter (Signed)
Scheduled per los. Called and left msg. Mailed printout  °

## 2019-08-12 NOTE — Progress Notes (Signed)
Pharmacist Chemotherapy Monitoring - Follow Up Assessment    I verify that I have reviewed each item in the below checklist:  . Regimen for the patient is scheduled for the appropriate day and plan matches scheduled date. Marland Kitchen Appropriate non-routine labs are ordered dependent on drug ordered. . If applicable, additional medications reviewed and ordered per protocol based on lifetime cumulative doses and/or treatment regimen.   Plan for follow-up and/or issues identified: No . I-vent associated with next due treatment: No . MD and/or nursing notified: No  Ritesh Opara K 08/12/2019 11:23 AM

## 2019-08-16 ENCOUNTER — Encounter (HOSPITAL_COMMUNITY): Payer: Self-pay

## 2019-08-16 ENCOUNTER — Ambulatory Visit (HOSPITAL_COMMUNITY)
Admission: RE | Admit: 2019-08-16 | Discharge: 2019-08-16 | Disposition: A | Discharge status: Home / Self Care | Payer: Medicare HMO | Source: Ambulatory Visit | Attending: Physician Assistant | Admitting: Physician Assistant

## 2019-08-16 ENCOUNTER — Other Ambulatory Visit: Payer: Self-pay

## 2019-08-16 DIAGNOSIS — C3491 Malignant neoplasm of unspecified part of right bronchus or lung: Secondary | ICD-10-CM | POA: Diagnosis not present

## 2019-08-16 MED ORDER — SODIUM CHLORIDE (PF) 0.9 % IJ SOLN
INTRAMUSCULAR | Status: AC
  Filled 2019-08-16: qty 50

## 2019-08-16 MED ORDER — IOHEXOL 300 MG/ML  SOLN
100.0000 mL | Freq: Once | INTRAMUSCULAR | Status: AC | PRN
  Administered 2019-08-16: 100 mL via INTRAVENOUS

## 2019-08-16 MED ORDER — HEPARIN SOD (PORK) LOCK FLUSH 100 UNIT/ML IV SOLN
500.0000 [IU] | Freq: Once | INTRAVENOUS | Status: AC
  Administered 2019-08-16: 500 [IU] via INTRAVENOUS

## 2019-08-16 MED ORDER — HEPARIN SOD (PORK) LOCK FLUSH 100 UNIT/ML IV SOLN
INTRAVENOUS | Status: AC
  Filled 2019-08-16: qty 5

## 2019-08-16 MED ORDER — IOHEXOL 9 MG/ML PO SOLN
ORAL | Status: AC
  Administered 2019-08-16: 1000 mL via ORAL
  Filled 2019-08-16: qty 1000

## 2019-08-16 MED ORDER — IOHEXOL 9 MG/ML PO SOLN: 1000.0000 mL | ORAL | Status: AC

## 2019-08-18 ENCOUNTER — Other Ambulatory Visit: Payer: Self-pay

## 2019-08-18 ENCOUNTER — Encounter: Payer: Self-pay | Admitting: Internal Medicine

## 2019-08-18 ENCOUNTER — Inpatient Hospital Stay (HOSPITAL_BASED_OUTPATIENT_CLINIC_OR_DEPARTMENT_OTHER): Payer: Medicare HMO | Admitting: Medical

## 2019-08-18 ENCOUNTER — Inpatient Hospital Stay: Payer: Medicare HMO

## 2019-08-18 ENCOUNTER — Inpatient Hospital Stay (HOSPITAL_BASED_OUTPATIENT_CLINIC_OR_DEPARTMENT_OTHER): Payer: Medicare HMO | Admitting: Internal Medicine

## 2019-08-18 ENCOUNTER — Other Ambulatory Visit: Payer: Self-pay | Admitting: Medical

## 2019-08-18 VITALS — BP 119/79 | HR 78 | Temp 97.7°F | Resp 18 | Ht 61.5 in | Wt 138.3 lb

## 2019-08-18 DIAGNOSIS — Z95828 Presence of other vascular implants and grafts: Secondary | ICD-10-CM

## 2019-08-18 DIAGNOSIS — Z5112 Encounter for antineoplastic immunotherapy: Secondary | ICD-10-CM | POA: Diagnosis not present

## 2019-08-18 DIAGNOSIS — R05 Cough: Secondary | ICD-10-CM

## 2019-08-18 DIAGNOSIS — R059 Cough, unspecified: Secondary | ICD-10-CM

## 2019-08-18 DIAGNOSIS — C3491 Malignant neoplasm of unspecified part of right bronchus or lung: Secondary | ICD-10-CM

## 2019-08-18 DIAGNOSIS — I10 Essential (primary) hypertension: Secondary | ICD-10-CM

## 2019-08-18 DIAGNOSIS — R5383 Other fatigue: Secondary | ICD-10-CM

## 2019-08-18 DIAGNOSIS — Z720 Tobacco use: Secondary | ICD-10-CM

## 2019-08-18 DIAGNOSIS — R067 Sneezing: Secondary | ICD-10-CM

## 2019-08-18 LAB — CMP (CANCER CENTER ONLY)
ALT: 10 U/L (ref 0–44)
AST: 12 U/L — ABNORMAL LOW (ref 15–41)
Albumin: 3.9 g/dL (ref 3.5–5.0)
Alkaline Phosphatase: 65 U/L (ref 38–126)
Anion gap: 9 (ref 5–15)
BUN: 23 mg/dL (ref 8–23)
CO2: 29 mmol/L (ref 22–32)
Calcium: 9.4 mg/dL (ref 8.9–10.3)
Chloride: 100 mmol/L (ref 98–111)
Creatinine: 1.28 mg/dL — ABNORMAL HIGH (ref 0.44–1.00)
GFR, Est AFR Am: 48 mL/min — ABNORMAL LOW (ref >60)
GFR, Estimated: 42 mL/min — ABNORMAL LOW (ref >60)
Glucose, Bld: 91 mg/dL (ref 70–99)
Potassium: 3.4 mmol/L — ABNORMAL LOW (ref 3.5–5.1)
Sodium: 138 mmol/L (ref 135–145)
Total Bilirubin: 0.9 mg/dL (ref 0.3–1.2)
Total Protein: 8 g/dL (ref 6.5–8.1)

## 2019-08-18 LAB — CBC WITH DIFFERENTIAL (CANCER CENTER ONLY)
Abs Immature Granulocytes: 0.01 10*3/uL (ref 0.00–0.07)
Basophils Absolute: 0 10*3/uL (ref 0.0–0.1)
Basophils Relative: 1 %
Eosinophils Absolute: 0.9 10*3/uL — ABNORMAL HIGH (ref 0.0–0.5)
Eosinophils Relative: 19 %
HCT: 35.3 % — ABNORMAL LOW (ref 36.0–46.0)
Hemoglobin: 11.7 g/dL — ABNORMAL LOW (ref 12.0–15.0)
Immature Granulocytes: 0 %
Lymphocytes Relative: 27 %
Lymphs Abs: 1.3 10*3/uL (ref 0.7–4.0)
MCH: 31.9 pg (ref 26.0–34.0)
MCHC: 33.1 g/dL (ref 30.0–36.0)
MCV: 96.2 fL (ref 80.0–100.0)
Monocytes Absolute: 0.6 10*3/uL (ref 0.1–1.0)
Monocytes Relative: 12 %
Neutro Abs: 2.1 10*3/uL (ref 1.7–7.7)
Neutrophils Relative %: 41 %
Platelet Count: 166 10*3/uL (ref 150–400)
RBC: 3.67 MIL/uL — ABNORMAL LOW (ref 3.87–5.11)
RDW: 13.5 % (ref 11.5–15.5)
WBC Count: 4.9 10*3/uL (ref 4.0–10.5)
nRBC: 0 % (ref 0.0–0.2)

## 2019-08-18 LAB — TSH: TSH: 0.35 u[IU]/mL (ref 0.308–3.960)

## 2019-08-18 LAB — SARS CORONAVIRUS 2 (TAT 6-24 HRS): SARS Coronavirus 2: NEGATIVE

## 2019-08-18 MED ORDER — SODIUM CHLORIDE 0.9% FLUSH
10.0000 mL | INTRAVENOUS | Status: DC | PRN
  Administered 2019-08-18: 10 mL
  Filled 2019-08-18: qty 10

## 2019-08-18 MED ORDER — HEPARIN SOD (PORK) LOCK FLUSH 100 UNIT/ML IV SOLN
500.0000 [IU] | Freq: Once | INTRAVENOUS | Status: AC | PRN
  Administered 2019-08-18: 500 [IU]
  Filled 2019-08-18: qty 5

## 2019-08-18 NOTE — Progress Notes (Signed)
No treatment, will await Covid test results and call her back if Negative to reschedule in the next 48 hours. Patient agreed.

## 2019-08-18 NOTE — Progress Notes (Signed)
[place] [phone]   [phone]  OFFICE PROGRESS NOTE  [name], MD [place] [address]  DIAGNOSIS: Stage IV non-small cell lung cancer, squamous cell carcinoma. She presented withright upper lobe lung mass in addition to pleural-based metastasis andmediastinal lymphadenopathy.She was diagnosed in September 2020.  PRIOR THERAPY: None  CURRENT THERAPY: Chemotherapy with carboplatin for an AUC of 5,paclitaxel 175 mg/m, and Keytruda 200 mg IV every 3 weeks with Neulasta support. Firsttreatmenton 12/09/2018.Status post12cycles.  Starting from cycle #5 she is currently on maintenance treatment with single agent Keytruda every 3 weeks.  INTERVAL HISTORY: Arrington Bencomo 74 y.o. female returns to the clinic today for follow-up visit.  The patient is feeling fine today with no concerning complaints except for persistent cough and chest congestion.  She received her 2 Covid vaccine shots in the past.  She has not been tested recently.  She denied having any chest pain but has shortness of breath with exertion with no hemoptysis.  She denied having any recent weight loss or night sweats.  She has no nausea, vomiting, diarrhea or constipation.  She has no headache or visual changes.  She denied having any recent weight loss or night sweats.  She continues to tolerate her treatment with Keytruda fairly well.  The patient had repeat CT scan of the chest, abdomen pelvis performed recently and she is here for evaluation and discussion of her risk her results.  MEDICAL HISTORY: Past Medical History:  Diagnosis Date  . COPD (chronic obstructive pulmonary disease) ([place])   . Depression   . Essential hypertension   . Headache   . History of migraine headaches   . lung ca dx'd 10/2018  . Sleep apnea   . Tobacco use disorder     ALLERGIES:  is allergic to codeine sulfate and pantoprazole sodium.  MEDICATIONS:   Current Outpatient Medications  Medication Sig Dispense Refill  . acetaminophen (TYLENOL) 325 MG tablet Take 2 tablets (650 mg total) by mouth every 6 (six) hours as needed. 30 tablet 0  . albuterol (VENTOLIN HFA) 108 (90 Base) MCG/ACT inhaler Inhale 2 puffs into the lungs every 6 (six) hours as needed for wheezing or shortness of breath. 36 g 2  . amLODipine (NORVASC) 10 MG tablet Take 1 tablet (10 mg total) by mouth daily. 90 tablet 3  . atorvastatin (LIPITOR) 20 MG tablet Take 1 tablet (20 mg total) by mouth daily. 90 tablet 3  . chlorthalidone (HYGROTON) 50 MG tablet Take 1 tablet (50 mg total) by mouth daily. 90 tablet 1  . famotidine (PEPCID) 20 MG tablet Take 20 mg by mouth daily.     Marland Kitchen FLUoxetine (PROZAC) 20 MG capsule Take 1 capsule (20 mg total) by mouth daily. 30 capsule 5  . HYDROcodone-acetaminophen (NORCO) 7.5-325 MG tablet Take 1 tablet by mouth every 6 (six) hours as needed for moderate pain. 60 tablet 0  . lidocaine-prilocaine (EMLA) cream Apply 1 application topically as needed. 30 g 0  . lisinopril (ZESTRIL) 10 MG tablet Take 1 tablet (10 mg total) by mouth daily. 90 tablet 1  . nicotine (NICODERM CQ - DOSED IN MG/24 HOURS) 14 mg/24hr patch PLACE 1 PATCH (14 MG TOTAL) ONTO THE SKIN DAILY. 28 patch 1  . potassium chloride 20 MEQ TBCR Take 20 mEq by mouth daily. 10 tablet 0  . prochlorperazine (COMPAZINE) 10 MG tablet Take 1 tablet (10 mg total) by mouth every 6 (  six) hours as needed for nausea or vomiting. 30 tablet 2  . RIVAROXABAN (XARELTO) VTE STARTER PACK (15 & 20 MG TABLETS) Follow package directions: Take one 67m tablet by mouth twice a day. On day 22, switch to one 226mtablet once a day. Take with food. 51 each 0  . Tiotropium Bromide-Olodaterol (STIOLTO RESPIMAT) 2.5-2.5 MCG/ACT AERS Inhale 2 puffs into the lungs daily. 4 g 5   No current facility-administered medications for this visit.   Facility-Administered Medications Ordered in Other Visits  Medication Dose  Route Frequency Provider Last Rate Last Admin  . sodium chloride flush (NS) 0.9 % injection 10 mL  10 mL Intracatheter PRN MoCurt BearsMD   10 mL at 08/18/19 1033    SURGICAL HISTORY:  Past Surgical History:  Procedure Laterality Date  . ABDOMINAL HYSTERECTOMY    . CHOLECYSTECTOMY    . IR IMAGING GUIDED PORT INSERTION  02/24/2019  . ROTATOR CUFF REPAIR  3/04  . VIDEO BRONCHOSCOPY WITH ENDOBRONCHIAL ULTRASOUND Right 11/25/2018   Procedure: VIDEO BRONCHOSCOPY WITH ENDOBRONCHIAL ULTRASOUND;  Surgeon: ByCollene GobbleMD;  Location: [facility] OR;  Service: Thoracic;  Laterality: Right;    REVIEW OF SYSTEMS:  Constitutional: positive for fatigue Eyes: negative Ears, nose, mouth, throat, and face: negative Respiratory: positive for cough and dyspnea on exertion Cardiovascular: negative Gastrointestinal: negative Genitourinary:negative Integument/breast: negative Hematologic/lymphatic: negative Musculoskeletal:negative Neurological: negative Behavioral/Psych: negative Endocrine: negative Allergic/Immunologic: negative   PHYSICAL EXAMINATION: General appearance: alert, cooperative, fatigued and no distress Head: Normocephalic, without obvious abnormality, atraumatic Neck: no adenopathy, no JVD, supple, symmetrical, trachea midline and thyroid not enlarged, symmetric, no tenderness/mass/nodules Lymph nodes: Cervical, supraclavicular, and axillary nodes normal. Resp: clear to auscultation bilaterally Back: symmetric, no curvature. ROM normal. No CVA tenderness. Cardio: regular rate and rhythm, S1, S2 normal, no murmur, click, rub or gallop GI: soft, non-tender; bowel sounds normal; no masses,  no organomegaly Extremities: extremities normal, atraumatic, no cyanosis or edema Neurologic: Alert and oriented X 3, normal strength and tone. Normal symmetric reflexes. Normal coordination and gait  ECOG PERFORMANCE STATUS: 1 - Symptomatic but completely ambulatory  Blood pressure 119/79, pulse  78, temperature 97.7 F (36.5 C), temperature source Temporal, resp. rate 18, height 5' 1.5" (1.562 m), weight 138 lb 4.8 oz (62.7 kg), SpO2 95 %.  LABORATORY DATA: Lab Results  Component Value Date   WBC 3.9 (L) 07/28/2019   HGB 11.9 (L) 07/28/2019   HCT 36.1 07/28/2019   MCV 96.3 07/28/2019   PLT 226 07/28/2019      Chemistry      Component Value Date/Time   NA 141 07/28/2019 1015   NA 139 07/21/2017 0958   K 3.0 (LL) 07/28/2019 1015   CL 104 07/28/2019 1015   CO2 27 07/28/2019 1015   BUN 24 (H) 07/28/2019 1015   BUN 26 07/21/2017 0958   CREATININE 1.22 (H) 07/28/2019 1015   CREATININE 0.79 09/03/2013 1540      Component Value Date/Time   CALCIUM 9.6 07/28/2019 1015   ALKPHOS 60 07/28/2019 1015   AST 12 (L) 07/28/2019 1015   ALT 10 07/28/2019 1015   BILITOT 0.9 07/28/2019 1015       RADIOGRAPHIC STUDIES: CT Chest W Contrast  Result Date: 08/16/2019 CLINICAL DATA:  Squamous cell carcinoma of lung. Chemotherapy and immunotherapy on going. Cough for 1 week. EXAM: CT CHEST, ABDOMEN, AND PELVIS WITH CONTRAST TECHNIQUE: Multidetector CT imaging of the chest, abdomen and pelvis was performed following the standard protocol during bolus administration of  intravenous contrast. CONTRAST:  122m OMNIPAQUE IOHEXOL 300 MG/ML  SOLN COMPARISON:  06/01/2019 FINDINGS: CT CHEST FINDINGS Cardiovascular: Aortic atherosclerosis. Tortuous thoracic aorta. Juxta diaphragmatic descending thoracic aortic dilatation including at 4.1 cm is similar. Normal heart size with multivessel coronary artery atherosclerosis. No central pulmonary embolism, on this non-dedicated study. Right-sided Port-A-Cath tip at high right atrium. The previously described right internal jugular vein thrombus has resolved. Mediastinum/Nodes: No supraclavicular adenopathy. High right paratracheal 1.2 cm node on 10/02, felt to be similar to minimally enlarged from 1.1 cm on the prior. A pretracheal node of 1.1 cm on 17/2 is  enlarged from 8 mm on the prior exam (when remeasured). Low right paratracheal node measures 1.4 cm on 23/2 versus 1.2 cm on the prior exam (when remeasured). Right hilar node of 1.5 cm on 28/2 measures 1.4 cm on the prior exam (when remeasured). Fluid level in the esophagus on 28/2. Lungs/Pleura: No pleural fluid. Moderate centrilobular and paraseptal emphysema. Lower lobe predominant bronchial wall thickening. Soft tissue thickening about the lateral right middle lobe is unchanged, including on 103/6. Minimal left upper lobe subpleural nodularity at 2-3 mm on 63/6, similar. Similar 3 mm right lower lobe pulmonary nodule on 84/6. More inferior left upper lobe 3 mm nodule on 77/6, new. Medial left lower lobe 5 mm pulmonary nodule on 85/6, new. Subpleural left lower lobe 4 mm nodule on 98/6, similar. Anterior left lower lobe subpleural pulmonary nodule of 3 mm on 78/6, new. Musculoskeletal: No acute osseous abnormality. Lower cervical and upper thoracic spondylosis with prior cervical spine fixation. CT ABDOMEN PELVIS FINDINGS Hepatobiliary: Right hepatic lobe cyst and bilateral too small to characterize liver lesions. Cholecystectomy. Common duct upper normal in similar, including at 1.0 cm. Pancreas: Normal, without mass or ductal dilatation. Spleen: Normal in size, without focal abnormality. Adrenals/Urinary Tract: Normal adrenal glands. Normal kidneys, without hydronephrosis. Normal urinary bladder. Stomach/Bowel: Normal stomach, without wall thickening. Right-sided apparent colonic wall thickening, including on 78/2, is favored to be due to underdistention. Normal small bowel. Vascular/Lymphatic: Advanced aortic and branch vessel atherosclerosis. Infrarenal aortic dilatation including at 4.3 cm on 49/2. 4.2 cm on the prior. No abdominopelvic adenopathy. Reproductive: Hysterectomy. Left adnexal cystic lesion of 6.8 x 6.3 cm on 93/2. Compare 6.6 x 6.2 cm on the prior exam. Other: No significant free fluid.  Musculoskeletal: No acute osseous abnormality. Lumbosacral spondylosis. IMPRESSION: 1. Mild enlargement of thoracic nodes, suspicious for progressive nodal metastasis. 2. Bilateral pulmonary nodules, including new primarily subpleural nodules. Cannot exclude progressive pulmonary metastasis. 3. No evidence of metastatic disease in the abdomen or pelvis. 4. Further enlargement of a nonspecific left adnexal cystic lesion. 5. Aortic atherosclerosis (ICD10-I70.0), coronary artery atherosclerosis and emphysema (ICD10-J43.9). 6. Cholecystectomy with similar borderline common duct dilatation. 7. 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 8. Esophageal air fluid level suggests dysmotility or gastroesophageal reflux. 9. Apparent ascending colonic wall thickening, favored to be due to underdistention. Correlate with any abdominal/colonic complaints. Electronically Signed   By: KAbigail MiyamotoM.D.   On: 08/16/2019 15:17   CT Abdomen Pelvis W Contrast  Result Date: 08/16/2019 CLINICAL DATA:  Squamous cell carcinoma of lung. Chemotherapy and immunotherapy on going. Cough for 1 week. EXAM: CT CHEST, ABDOMEN, AND PELVIS WITH CONTRAST TECHNIQUE: Multidetector CT imaging of the chest, abdomen and pelvis was performed following the standard protocol during bolus administration of intravenous contrast. CONTRAST:  1052mOMNIPAQUE IOHEXOL 300 MG/ML  SOLN COMPARISON:  06/01/2019 FINDINGS: CT CHEST FINDINGS Cardiovascular:  Aortic atherosclerosis. Tortuous thoracic aorta. Juxta diaphragmatic descending thoracic aortic dilatation including at 4.1 cm is similar. Normal heart size with multivessel coronary artery atherosclerosis. No central pulmonary embolism, on this non-dedicated study. Right-sided Port-A-Cath tip at high right atrium. The previously described right internal jugular vein thrombus has resolved. Mediastinum/Nodes: No supraclavicular adenopathy. High  right paratracheal 1.2 cm node on 10/02, felt to be similar to minimally enlarged from 1.1 cm on the prior. A pretracheal node of 1.1 cm on 17/2 is enlarged from 8 mm on the prior exam (when remeasured). Low right paratracheal node measures 1.4 cm on 23/2 versus 1.2 cm on the prior exam (when remeasured). Right hilar node of 1.5 cm on 28/2 measures 1.4 cm on the prior exam (when remeasured). Fluid level in the esophagus on 28/2. Lungs/Pleura: No pleural fluid. Moderate centrilobular and paraseptal emphysema. Lower lobe predominant bronchial wall thickening. Soft tissue thickening about the lateral right middle lobe is unchanged, including on 103/6. Minimal left upper lobe subpleural nodularity at 2-3 mm on 63/6, similar. Similar 3 mm right lower lobe pulmonary nodule on 84/6. More inferior left upper lobe 3 mm nodule on 77/6, new. Medial left lower lobe 5 mm pulmonary nodule on 85/6, new. Subpleural left lower lobe 4 mm nodule on 98/6, similar. Anterior left lower lobe subpleural pulmonary nodule of 3 mm on 78/6, new. Musculoskeletal: No acute osseous abnormality. Lower cervical and upper thoracic spondylosis with prior cervical spine fixation. CT ABDOMEN PELVIS FINDINGS Hepatobiliary: Right hepatic lobe cyst and bilateral too small to characterize liver lesions. Cholecystectomy. Common duct upper normal in similar, including at 1.0 cm. Pancreas: Normal, without mass or ductal dilatation. Spleen: Normal in size, without focal abnormality. Adrenals/Urinary Tract: Normal adrenal glands. Normal kidneys, without hydronephrosis. Normal urinary bladder. Stomach/Bowel: Normal stomach, without wall thickening. Right-sided apparent colonic wall thickening, including on 78/2, is favored to be due to underdistention. Normal small bowel. Vascular/Lymphatic: Advanced aortic and branch vessel atherosclerosis. Infrarenal aortic dilatation including at 4.3 cm on 49/2. 4.2 cm on the prior. No abdominopelvic adenopathy. Reproductive:  Hysterectomy. Left adnexal cystic lesion of 6.8 x 6.3 cm on 93/2. Compare 6.6 x 6.2 cm on the prior exam. Other: No significant free fluid. Musculoskeletal: No acute osseous abnormality. Lumbosacral spondylosis. IMPRESSION: 1. Mild enlargement of thoracic nodes, suspicious for progressive nodal metastasis. 2. Bilateral pulmonary nodules, including new primarily subpleural nodules. Cannot exclude progressive pulmonary metastasis. 3. No evidence of metastatic disease in the abdomen or pelvis. 4. Further enlargement of a nonspecific left adnexal cystic lesion. 5. Aortic atherosclerosis (ICD10-I70.0), coronary artery atherosclerosis and emphysema (ICD10-J43.9). 6. Cholecystectomy with similar borderline common duct dilatation. 7. 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 8. Esophageal air fluid level suggests dysmotility or gastroesophageal reflux. 9. Apparent ascending colonic wall thickening, favored to be due to underdistention. Correlate with any abdominal/colonic complaints. Electronically Signed   By: Abigail Miyamoto M.D.   On: 08/16/2019 15:17    ASSESSMENT AND PLAN: This is a very pleasant 74 years old African-American female with stage IV non-small cell carcinoma,, squamous cell carcinoma diagnosed in September 2020.  She presented with extensive right-sided pleural and thoracic nodal hypermetabolic disease with no extrathoracic disease. The patient started induction treatment with systemic chemotherapy with carboplatin, paclitaxel and Keytruda status post 4 cycles with partial response after cycle #4.  She is currently undergoing maintenance treatment with single agent Keytruda status post 8 cycles. The patient has been tolerating this treatment well with no concerning  adverse effects. She had repeat CT scan of the chest, abdomen pelvis performed recently.  I personally and independently reviewed the scan images and discussed the  results with the patient today. Her scan showed mild increase in some of the pulmonary nodules and lymph node concerning for mild disease progression. I recommended for the patient to continue her current treatment with [facility] for now and will continue to monitor these pulmonary nodules and lymph node closely on the upcoming imaging studies. For the cough, she will use Delsym over-the-counter.  We will also consider testing the patient for Covid. She was advised to call immediately if she has any concerning symptoms in the interval. The patient voices understanding of current disease status and treatment options and is in agreement with the current care plan.  All questions were answered. The patient knows to call the clinic with any problems, questions or concerns. We can certainly see the patient much sooner if necessary.  Disclaimer: This note was dictated with voice recognition software. Similar sounding words can inadvertently be transcribed and may not be corrected upon review.

## 2019-08-19 ENCOUNTER — Inpatient Hospital Stay: Payer: Medicare HMO

## 2019-08-19 VITALS — BP 113/64 | HR 80 | Temp 98.7°F | Resp 16

## 2019-08-19 DIAGNOSIS — Z5112 Encounter for antineoplastic immunotherapy: Secondary | ICD-10-CM | POA: Diagnosis not present

## 2019-08-19 DIAGNOSIS — C3491 Malignant neoplasm of unspecified part of right bronchus or lung: Secondary | ICD-10-CM

## 2019-08-19 MED ORDER — HEPARIN SOD (PORK) LOCK FLUSH 100 UNIT/ML IV SOLN
500.0000 [IU] | Freq: Once | INTRAVENOUS | Status: AC | PRN
  Administered 2019-08-19: 500 [IU]
  Filled 2019-08-19: qty 5

## 2019-08-19 MED ORDER — SODIUM CHLORIDE 0.9% FLUSH
10.0000 mL | INTRAVENOUS | Status: DC | PRN
  Administered 2019-08-19: 10 mL
  Filled 2019-08-19: qty 10

## 2019-08-19 MED ORDER — SODIUM CHLORIDE 0.9 % IV SOLN
200.0000 mg | Freq: Once | INTRAVENOUS | Status: AC
  Administered 2019-08-19: 200 mg via INTRAVENOUS
  Filled 2019-08-19: qty 8

## 2019-08-19 MED ORDER — SODIUM CHLORIDE 0.9 % IV SOLN
Freq: Once | INTRAVENOUS | Status: AC
  Filled 2019-08-19: qty 250

## 2019-08-19 NOTE — Progress Notes (Signed)
Results are noted. This report has been forwarded to the patient's primary oncologist.

## 2019-08-19 NOTE — Patient Instructions (Signed)
[facility] Discharge Instructions for Patients Receiving Chemotherapy  Today you received the following chemotherapy agents:  Keytruda.  To help prevent nausea and vomiting after your treatment, we encourage you to take your nausea medication as directed.   If you develop nausea and vomiting that is not controlled by your nausea medication, call the clinic.   BELOW ARE SYMPTOMS THAT SHOULD BE REPORTED IMMEDIATELY:  *FEVER GREATER THAN 100.5 F  *CHILLS WITH OR WITHOUT FEVER  NAUSEA AND VOMITING THAT IS NOT CONTROLLED WITH YOUR NAUSEA MEDICATION  *UNUSUAL SHORTNESS OF BREATH  *UNUSUAL BRUISING OR BLEEDING  TENDERNESS IN MOUTH AND THROAT WITH OR WITHOUT PRESENCE OF ULCERS  *URINARY PROBLEMS  *BOWEL PROBLEMS  UNUSUAL RASH Items with * indicate a potential emergency and should be followed up as soon as possible.  Feel free to call the clinic should you have any questions or concerns. The clinic phone number is [phone].  Please show the CHEMO ALERT CARD at check-in to the Emergency Department and triage nurse.    

## 2019-08-19 NOTE — Progress Notes (Signed)
Debra Rodgers

## 2019-08-20 NOTE — Progress Notes (Signed)
Ms. Allshouse was seen for COVID-19 testing as she has a new onset of a cough with nasal discharge. A rapid COVID test was collected and the patient was released home.  Sandi Mealy, MHS, PA-C Physician Assistant

## 2019-08-23 ENCOUNTER — Encounter (HOSPITAL_COMMUNITY): Payer: Self-pay | Admitting: Emergency Medicine

## 2019-08-23 ENCOUNTER — Other Ambulatory Visit: Payer: Self-pay

## 2019-08-23 ENCOUNTER — Emergency Department (HOSPITAL_COMMUNITY): Payer: Medicare HMO

## 2019-08-23 ENCOUNTER — Inpatient Hospital Stay (HOSPITAL_COMMUNITY)
Admission: EM | Admit: 2019-08-23 | Discharge: 2019-08-26 | DRG: 190 | Disposition: A | Discharge status: Home / Self Care | Payer: Medicare HMO | Attending: Family Medicine | Admitting: Family Medicine

## 2019-08-23 DIAGNOSIS — C3491 Malignant neoplasm of unspecified part of right bronchus or lung: Secondary | ICD-10-CM | POA: Diagnosis present

## 2019-08-23 DIAGNOSIS — I10 Essential (primary) hypertension: Secondary | ICD-10-CM | POA: Diagnosis not present

## 2019-08-23 DIAGNOSIS — J441 Chronic obstructive pulmonary disease with (acute) exacerbation: Principal | ICD-10-CM | POA: Diagnosis present

## 2019-08-23 DIAGNOSIS — Z87891 Personal history of nicotine dependence: Secondary | ICD-10-CM

## 2019-08-23 DIAGNOSIS — I714 Abdominal aortic aneurysm, without rupture, unspecified: Secondary | ICD-10-CM | POA: Diagnosis present

## 2019-08-23 DIAGNOSIS — J9601 Acute respiratory failure with hypoxia: Secondary | ICD-10-CM | POA: Diagnosis present

## 2019-08-23 DIAGNOSIS — E785 Hyperlipidemia, unspecified: Secondary | ICD-10-CM | POA: Diagnosis present

## 2019-08-23 DIAGNOSIS — Z20822 Contact with and (suspected) exposure to covid-19: Secondary | ICD-10-CM | POA: Diagnosis present

## 2019-08-23 DIAGNOSIS — Z90710 Acquired absence of both cervix and uterus: Secondary | ICD-10-CM

## 2019-08-23 DIAGNOSIS — G4733 Obstructive sleep apnea (adult) (pediatric): Secondary | ICD-10-CM | POA: Diagnosis present

## 2019-08-23 DIAGNOSIS — D630 Anemia in neoplastic disease: Secondary | ICD-10-CM | POA: Diagnosis present

## 2019-08-23 DIAGNOSIS — F329 Major depressive disorder, single episode, unspecified: Secondary | ICD-10-CM | POA: Diagnosis present

## 2019-08-23 DIAGNOSIS — Z86718 Personal history of other venous thrombosis and embolism: Secondary | ICD-10-CM

## 2019-08-23 DIAGNOSIS — Z9221 Personal history of antineoplastic chemotherapy: Secondary | ICD-10-CM

## 2019-08-23 LAB — D-DIMER, QUANTITATIVE: D-Dimer, Quant: 2.14 ug/mL-FEU — ABNORMAL HIGH (ref 0.00–0.50)

## 2019-08-23 LAB — BASIC METABOLIC PANEL
Anion gap: 11 (ref 5–15)
BUN: 23 mg/dL (ref 8–23)
CO2: 29 mmol/L (ref 22–32)
Calcium: 9.2 mg/dL (ref 8.9–10.3)
Chloride: 97 mmol/L — ABNORMAL LOW (ref 98–111)
Creatinine, Ser: 0.99 mg/dL (ref 0.44–1.00)
GFR calc Af Amer: >60 mL/min (ref >60)
GFR calc non Af Amer: 57 mL/min — ABNORMAL LOW (ref >60)
Glucose, Bld: 100 mg/dL — ABNORMAL HIGH (ref 70–99)
Potassium: 3.6 mmol/L (ref 3.5–5.1)
Sodium: 137 mmol/L (ref 135–145)

## 2019-08-23 LAB — BRAIN NATRIURETIC PEPTIDE: B Natriuretic Peptide: 16.6 pg/mL (ref 0.0–100.0)

## 2019-08-23 LAB — CBC WITH DIFFERENTIAL/PLATELET
Abs Immature Granulocytes: 0.01 10*3/uL (ref 0.00–0.07)
Basophils Absolute: 0.1 10*3/uL (ref 0.0–0.1)
Basophils Relative: 1 %
Eosinophils Absolute: 1.1 10*3/uL — ABNORMAL HIGH (ref 0.0–0.5)
Eosinophils Relative: 21 %
HCT: 36.3 % (ref 36.0–46.0)
Hemoglobin: 12 g/dL (ref 12.0–15.0)
Immature Granulocytes: 0 %
Lymphocytes Relative: 24 %
Lymphs Abs: 1.3 10*3/uL (ref 0.7–4.0)
MCH: 32.7 pg (ref 26.0–34.0)
MCHC: 33.1 g/dL (ref 30.0–36.0)
MCV: 98.9 fL (ref 80.0–100.0)
Monocytes Absolute: 0.6 10*3/uL (ref 0.1–1.0)
Monocytes Relative: 11 %
Neutro Abs: 2.2 10*3/uL (ref 1.7–7.7)
Neutrophils Relative %: 43 %
Platelets: 188 10*3/uL (ref 150–400)
RBC: 3.67 MIL/uL — ABNORMAL LOW (ref 3.87–5.11)
RDW: 13.2 % (ref 11.5–15.5)
WBC: 5.3 10*3/uL (ref 4.0–10.5)
nRBC: 0 % (ref 0.0–0.2)

## 2019-08-23 LAB — SARS CORONAVIRUS 2 BY RT PCR (HOSPITAL ORDER, PERFORMED IN ~~LOC~~ HOSPITAL LAB): SARS Coronavirus 2: NEGATIVE

## 2019-08-23 MED ORDER — IPRATROPIUM BROMIDE 0.02 % IN SOLN: 0.5000 mg | RESPIRATORY_TRACT | Status: DC

## 2019-08-23 MED ORDER — ALBUTEROL SULFATE (2.5 MG/3ML) 0.083% IN NEBU: 2.5000 mg | INHALATION_SOLUTION | RESPIRATORY_TRACT | Status: DC

## 2019-08-23 MED ORDER — METHYLPREDNISOLONE SODIUM SUCC 125 MG IJ SOLR
125.0000 mg | Freq: Once | INTRAMUSCULAR | Status: AC
  Administered 2019-08-23: 125 mg via INTRAVENOUS
  Filled 2019-08-23: qty 2

## 2019-08-23 MED ORDER — AMLODIPINE BESYLATE 10 MG PO TABS
10.0000 mg | ORAL_TABLET | Freq: Every day | ORAL | Status: DC
  Administered 2019-08-24 – 2019-08-26 (×3): 10 mg via ORAL
  Filled 2019-08-23 (×3): qty 1

## 2019-08-23 MED ORDER — IPRATROPIUM BROMIDE 0.02 % IN SOLN
0.5000 mg | Freq: Once | RESPIRATORY_TRACT | Status: AC
  Administered 2019-08-23: 0.5 mg via RESPIRATORY_TRACT
  Filled 2019-08-23: qty 2.5

## 2019-08-23 MED ORDER — IPRATROPIUM-ALBUTEROL 0.5-2.5 (3) MG/3ML IN SOLN
3.0000 mL | RESPIRATORY_TRACT | Status: DC
  Administered 2019-08-23 – 2019-08-24 (×3): 3 mL via RESPIRATORY_TRACT
  Filled 2019-08-23 (×4): qty 3

## 2019-08-23 MED ORDER — ALBUTEROL SULFATE HFA 108 (90 BASE) MCG/ACT IN AERS
4.0000 | INHALATION_SPRAY | Freq: Once | RESPIRATORY_TRACT | Status: AC
  Administered 2019-08-23: 4 via RESPIRATORY_TRACT
  Filled 2019-08-23: qty 6.7

## 2019-08-23 MED ORDER — FAMOTIDINE 20 MG PO TABS
20.0000 mg | ORAL_TABLET | Freq: Every day | ORAL | Status: DC
  Administered 2019-08-24 – 2019-08-26 (×3): 20 mg via ORAL
  Filled 2019-08-23 (×3): qty 1

## 2019-08-23 MED ORDER — ATORVASTATIN CALCIUM 20 MG PO TABS
20.0000 mg | ORAL_TABLET | Freq: Every day | ORAL | Status: DC
  Administered 2019-08-24 – 2019-08-26 (×3): 20 mg via ORAL
  Filled 2019-08-23 (×3): qty 1

## 2019-08-23 MED ORDER — LISINOPRIL 10 MG PO TABS
10.0000 mg | ORAL_TABLET | Freq: Every day | ORAL | Status: DC
  Administered 2019-08-24 – 2019-08-26 (×3): 10 mg via ORAL
  Filled 2019-08-23 (×3): qty 1

## 2019-08-23 MED ORDER — METHYLPREDNISOLONE SODIUM SUCC 40 MG IJ SOLR
40.0000 mg | Freq: Two times a day (BID) | INTRAMUSCULAR | Status: DC
  Administered 2019-08-24: 40 mg via INTRAVENOUS
  Filled 2019-08-23 (×2): qty 1

## 2019-08-23 MED ORDER — BUDESONIDE 0.25 MG/2ML IN SUSP
0.2500 mg | Freq: Two times a day (BID) | RESPIRATORY_TRACT | Status: DC
  Administered 2019-08-23 – 2019-08-26 (×5): 0.25 mg via RESPIRATORY_TRACT
  Filled 2019-08-23 (×6): qty 2

## 2019-08-23 MED ORDER — FLUOXETINE HCL 20 MG PO CAPS
20.0000 mg | ORAL_CAPSULE | Freq: Every day | ORAL | Status: DC
  Administered 2019-08-24 – 2019-08-26 (×3): 20 mg via ORAL
  Filled 2019-08-23 (×3): qty 1

## 2019-08-23 MED ORDER — ONDANSETRON HCL 4 MG/2ML IJ SOLN: 4.0000 mg | Freq: Four times a day (QID) | INTRAMUSCULAR | Status: DC | PRN

## 2019-08-23 MED ORDER — CHLORHEXIDINE GLUCONATE CLOTH 2 % EX PADS
6.0000 | MEDICATED_PAD | Freq: Every day | CUTANEOUS | Status: DC
  Administered 2019-08-24 – 2019-08-25 (×2): 6 via TOPICAL

## 2019-08-23 MED ORDER — MAGNESIUM SULFATE IN D5W 1-5 GM/100ML-% IV SOLN
1.0000 g | Freq: Once | INTRAVENOUS | Status: AC
  Administered 2019-08-23: 1 g via INTRAVENOUS
  Filled 2019-08-23: qty 100

## 2019-08-23 MED ORDER — ENOXAPARIN SODIUM 40 MG/0.4ML ~~LOC~~ SOLN
40.0000 mg | Freq: Every day | SUBCUTANEOUS | Status: DC
  Administered 2019-08-23 – 2019-08-25 (×3): 40 mg via SUBCUTANEOUS
  Filled 2019-08-23 (×3): qty 0.4

## 2019-08-23 MED ORDER — SODIUM CHLORIDE 0.9% FLUSH
10.0000 mL | INTRAVENOUS | Status: DC | PRN
  Administered 2019-08-24 – 2019-08-25 (×2): 10 mL

## 2019-08-23 MED ORDER — CHLORTHALIDONE 50 MG PO TABS
50.0000 mg | ORAL_TABLET | Freq: Every day | ORAL | Status: DC
  Administered 2019-08-24 – 2019-08-26 (×3): 50 mg via ORAL
  Filled 2019-08-23 (×3): qty 1

## 2019-08-23 MED ORDER — ALBUTEROL SULFATE (2.5 MG/3ML) 0.083% IN NEBU
5.0000 mg | INHALATION_SOLUTION | RESPIRATORY_TRACT | Status: DC | PRN
  Administered 2019-08-23: 5 mg via RESPIRATORY_TRACT
  Filled 2019-08-23: qty 6

## 2019-08-23 MED ORDER — MAGNESIUM SULFATE 50 % IJ SOLN: 1.0000 g | Freq: Once | INTRAMUSCULAR | Status: DC

## 2019-08-23 MED ORDER — SODIUM CHLORIDE 0.9 % IV SOLN: INTRAVENOUS | Status: DC

## 2019-08-23 MED ORDER — ONDANSETRON HCL 4 MG PO TABS: 4.0000 mg | ORAL_TABLET | Freq: Four times a day (QID) | ORAL | Status: DC | PRN

## 2019-08-23 MED ORDER — ALBUTEROL SULFATE (2.5 MG/3ML) 0.083% IN NEBU: 2.5000 mg | INHALATION_SOLUTION | RESPIRATORY_TRACT | Status: DC | PRN

## 2019-08-23 MED ORDER — POTASSIUM CHLORIDE CRYS ER 20 MEQ PO TBCR
20.0000 meq | EXTENDED_RELEASE_TABLET | Freq: Every day | ORAL | Status: DC
  Administered 2019-08-24 – 2019-08-26 (×3): 20 meq via ORAL
  Filled 2019-08-23 (×3): qty 1

## 2019-08-23 NOTE — ED Notes (Addendum)
Attempted to call report x 1. Receiving RN is giving direct patient care, will call back.

## 2019-08-23 NOTE — ED Triage Notes (Signed)
Pt c/o SOB since Friday. Pt audible wheezeing noted.

## 2019-08-23 NOTE — ED Notes (Signed)
Pt ambulated to restroom with assistance of NT

## 2019-08-23 NOTE — ED Notes (Signed)
Escorted pt back to room w/out incident.  Pt seated in bed and v/s cables reconnected. Family is at bedside and call light is w/in reach.

## 2019-08-23 NOTE — ED Notes (Signed)
Attempted to call report x 2. Receiving RN states she is in the middle of doing a med pass and to give her 10 minutes. Will continue to monitor.

## 2019-08-23 NOTE — ED Provider Notes (Signed)
[facility] DEPT Provider Note   CSN: [id number] Arrival date & time: 08/23/19  1621     History Chief Complaint  Patient presents with  . Shortness of Breath    Debra Rodgers is a 74 y.o. female.  HPI   Pt has history of COPD.  Pt no longer smokes.  She started feeling short of breath on Friday. The sx have been worsening over the weekend.  SHe has been wheezing.  SHe has been using her breathing treatment which help only temporarily.    No fevers.  No CP.  She does have a sore throat from coughing.  SHe has lung ca.  Last treatment was last Thursday.    Past Medical History:  Diagnosis Date  . COPD (chronic obstructive pulmonary disease) ([place])   . Depression   . Essential hypertension   . Headache   . History of migraine headaches   . lung ca dx'd 10/2018  . Sleep apnea   . Tobacco use disorder     Patient Active Problem List   Diagnosis Date Noted  . COPD exacerbation ([place]) 08/23/2019  . DVT (deep venous thrombosis) ([place]) 07/12/2019  . Port-A-Cath in place 07/07/2019  . Cancer associated pain 12/30/2018  . Constipation 12/16/2018  . Stage IV squamous cell carcinoma of right lung ([place]) 12/02/2018  . Goals of care, counseling/discussion 12/02/2018  . Encounter for antineoplastic chemotherapy 12/02/2018  . Encounter for antineoplastic immunotherapy 12/02/2018  . Abdominal aortic aneurysm (AAA) 35 to 39 mm in diameter ([place]) 11/05/2018  . BPPV (benign paroxysmal positional vertigo) 12/02/2016  . COPD (chronic obstructive pulmonary disease) ([place]) 09/23/2016  . OSA (obstructive sleep apnea) 10/02/2015  . Glucose intolerance 10/02/2015  . Adenomatous colon polyp 10/02/2015  . Tobacco use 04/17/2015  . Essential hypertension 04/17/2015  . Major depressive disorder, recurrent episode ([place]) 04/17/2015  . Osteoarthritis cervical spine 05/26/2013  . GERD 06/25/2007  . Hyperlipidemia 07/29/2006    Past Surgical History:  Procedure Laterality  Date  . ABDOMINAL HYSTERECTOMY    . CHOLECYSTECTOMY    . IR IMAGING GUIDED PORT INSERTION  02/24/2019  . ROTATOR CUFF REPAIR  3/04  . VIDEO BRONCHOSCOPY WITH ENDOBRONCHIAL ULTRASOUND Right 11/25/2018   Procedure: VIDEO BRONCHOSCOPY WITH ENDOBRONCHIAL ULTRASOUND;  Surgeon: Collene Gobble, MD;  Location: [facility];  Service: Thoracic;  Laterality: Right;     OB History   No obstetric history on file.     Family History  Problem Relation Age of Onset  . Hypertension Mother   . Stroke Mother   . Coronary artery disease Mother   . Heart disease Father   . Diabetes Sister   . Hypertension Sister   . Cancer Sister   . Breast cancer Sister     Social History   Tobacco Use  . Smoking status: Former Smoker    Packs/day: 1.00    Years: 56.00    Pack years: 56.00    Types: Cigarettes    Start date: 1964    Quit date: 11/04/2018    Years since quitting: 0.8  . Smokeless tobacco: Never Used  Substance Use Topics  . Alcohol use: No    Alcohol/week: 0.0 standard drinks  . Drug use: No    Home Medications Prior to Admission medications   Medication Sig Start Date End Date Taking? Authorizing Provider  acetaminophen (TYLENOL) 325 MG tablet Take 2 tablets (650 mg total) by mouth every 6 (six) hours as needed. 11/05/18  Yes Varney Biles, MD  albuterol (VENTOLIN HFA) 108 (90 Base) MCG/ACT inhaler Inhale 2 puffs into the lungs every 6 (six) hours as needed for wheezing or shortness of breath. 07/23/19  Yes Collene Gobble, MD  amLODipine (NORVASC) 10 MG tablet Take 1 tablet (10 mg total) by mouth daily. 07/19/19  Yes [name], MD  atorvastatin (LIPITOR) 20 MG tablet Take 1 tablet (20 mg total) by mouth daily. 07/19/19  Yes [name], MD  chlorthalidone (HYGROTON) 50 MG tablet Take 1 tablet (50 mg total) by mouth daily. 07/19/19  Yes [name], MD  dextromethorphan (DELSYM) 30 MG/5ML liquid Take 60 mg by mouth as needed for cough.    Yes [provider]  famotidine (PEPCID) 20 MG tablet Take 20 mg by mouth daily.    Yes [provider]  FLUoxetine (PROZAC) 20 MG capsule Take 1 capsule (20 mg total) by mouth daily. 07/19/19  Yes [name], MD  lidocaine-prilocaine (EMLA) cream Apply 1 application topically as needed. Patient taking differently: Apply 1 application topically as needed (pain).  07/28/19  Yes Heilingoetter, Cassandra L, PA-C  lisinopril (ZESTRIL) 10 MG tablet Take 1 tablet (10 mg total) by mouth daily. 07/19/19  Yes [name], MD  potassium chloride 20 MEQ TBCR Take 20 mEq by mouth daily. 07/28/19  Yes Heilingoetter, Cassandra L, PA-C  Tiotropium Bromide-Olodaterol (STIOLTO RESPIMAT) 2.5-2.5 MCG/ACT AERS Inhale 2 puffs into the lungs daily. 02/05/19  Yes Collene Gobble, MD  HYDROcodone-acetaminophen (NORCO) 7.5-325 MG tablet Take 1 tablet by mouth every 6 (six) hours as needed for moderate pain. Patient not taking: Reported on 08/23/2019 12/30/18   Heilingoetter, Cassandra L, PA-C  nicotine (NICODERM CQ - DOSED IN MG/24 HOURS) 14 mg/24hr patch PLACE 1 PATCH (14 MG TOTAL) ONTO THE SKIN DAILY. Patient not taking: Reported on 08/23/2019 05/11/19   [name], MD  prochlorperazine (COMPAZINE) 10 MG tablet Take 1 tablet (10 mg total) by mouth every 6 (six) hours as needed for nausea or vomiting. Patient not taking: Reported on 08/23/2019 12/02/18   Heilingoetter, Cassandra L, PA-C  RIVAROXABAN (XARELTO) VTE STARTER PACK (15 & 20 MG TABLETS) Follow package directions: Take one 28m tablet by mouth twice a day. On day 22, switch to one 235mtablet once a day. Take with food. Patient not taking: Reported on 08/23/2019 07/12/19   Heilingoetter, Cassandra L, PA-C    Allergies    Codeine sulfate and Pantoprazole sodium  Review of Systems   Review of Systems  All other systems reviewed and are negative.   Physical Exam Updated Vital Signs BP 122/81 (BP Location: Left Arm)   Pulse 89    Temp 98.6 F (37 C) (Oral)   Resp (!) 22   Ht 1.562 m (5' 1.5")   Wt 63.2 kg   SpO2 97%   BMI 25.90 kg/m   Physical Exam Vitals and nursing note reviewed.  Constitutional:      Appearance: She is not toxic-appearing or diaphoretic.  HENT:     Head: Normocephalic and atraumatic.     Right Ear: External ear normal.     Left Ear: External ear normal.     Mouth/Throat:     Pharynx: No pharyngeal swelling or oropharyngeal exudate.  Eyes:     General: No scleral icterus.       Right eye: No discharge.        Left eye: No discharge.     Conjunctiva/sclera: Conjunctivae normal.  Neck:  Trachea: No tracheal deviation.  Cardiovascular:     Rate and Rhythm: Normal rate and regular rhythm.  Pulmonary:     Effort: Accessory muscle usage present. No respiratory distress.     Breath sounds: No stridor. Wheezing present. No rales.  Abdominal:     General: Bowel sounds are normal. There is no distension.     Palpations: Abdomen is soft.     Tenderness: There is no abdominal tenderness. There is no guarding or rebound.  Musculoskeletal:        General: No tenderness.     Cervical back: Neck supple.  Skin:    General: Skin is warm and dry.     Findings: No rash.  Neurological:     Mental Status: She is alert.     Cranial Nerves: No cranial nerve deficit (no facial droop, extraocular movements intact, no slurred speech).     Sensory: No sensory deficit.     Motor: No abnormal muscle tone or seizure activity.     Coordination: Coordination normal.     ED Results / Procedures / Treatments   Labs (all labs ordered are listed, but only abnormal results are displayed) Labs Reviewed  CBC WITH DIFFERENTIAL/PLATELET - Abnormal; Notable for the following components:      Result Value   RBC 3.67 (*)    Eosinophils Absolute 1.1 (*)    All other components within normal limits  BASIC METABOLIC PANEL - Abnormal; Notable for the following components:   Chloride 97 (*)    Glucose, Bld  100 (*)    GFR calc non Af Amer 57 (*)    All other components within normal limits  D-DIMER, QUANTITATIVE (NOT AT [facility]) - Abnormal; Notable for the following components:   D-Dimer, Quant 2.14 (*)    All other components within normal limits  SARS CORONAVIRUS 2 BY RT PCR (HOSPITAL ORDER, [facility])  BRAIN NATRIURETIC PEPTIDE  BASIC METABOLIC PANEL  CBC    EKG EKG Interpretation  Date/Time:  Monday Aug 23 2019 16:31:44 EDT Ventricular Rate:  89 PR Interval:    QRS Duration: 92 QT Interval:  383 QTC Calculation: 466 R Axis:   61 Text Interpretation: Sinus rhythm RSR' in V1 or V2, right VCD or RVH No significant change since last tracing Confirmed by Dorie Rank [phone]) on 08/23/2019 4:41:37 PM   Radiology DG Chest 2 View  Result Date: 08/23/2019 CLINICAL DATA:  74 year old female with cough and shortness of breath four days. History of lung cancer, on chemotherapy. EXAM: CHEST - 2 VIEW COMPARISON:  08/16/2019 chest CT and prior studies FINDINGS: The cardiomediastinal silhouette is unremarkable. A RIGHT IJ Port-A-Cath is again noted. LEFT basilar atelectasis again noted. There is no evidence of focal airspace disease, pulmonary edema, pulmonary mass, pleural effusion, or pneumothorax. The tiny nodules identified on recent CT are difficult to visualize radiographically. No acute bony abnormalities are identified. IMPRESSION: Unchanged LEFT basilar atelectasis. No new abnormalities identified. Electronically Signed   By: Margarette Canada M.D.   On: 08/23/2019 16:54    Procedures .Critical Care Performed by: Dorie Rank, MD Authorized by: Dorie Rank, MD   Critical care provider statement:    Critical care time (minutes):  45   Critical care was time spent personally by me on the following activities:  Discussions with consultants, evaluation of patient's response to treatment, examination of patient, ordering and performing treatments and interventions, ordering  and review of laboratory studies, ordering and review of radiographic  studies, pulse oximetry, re-evaluation of patient's condition, obtaining history from patient or surrogate and review of old charts   (including critical care time)  Medications Ordered in ED Medications  amLODipine (NORVASC) tablet 10 mg (has no administration in time range)  atorvastatin (LIPITOR) tablet 20 mg (has no administration in time range)  chlorthalidone (HYGROTON) tablet 50 mg (has no administration in time range)  lisinopril (ZESTRIL) tablet 10 mg (has no administration in time range)  FLUoxetine (PROZAC) capsule 20 mg (has no administration in time range)  famotidine (PEPCID) tablet 20 mg (has no administration in time range)  potassium chloride SA (KLOR-CON) CR tablet 20 mEq (has no administration in time range)  ondansetron (ZOFRAN) tablet 4 mg (has no administration in time range)    Or  ondansetron (ZOFRAN) injection 4 mg (has no administration in time range)  enoxaparin (LOVENOX) injection 40 mg (40 mg Subcutaneous Given 08/23/19 2235)  albuterol (PROVENTIL) (2.5 MG/3ML) 0.083% nebulizer solution 2.5 mg (has no administration in time range)  budesonide (PULMICORT) nebulizer solution 0.25 mg (0.25 mg Nebulization Given 08/23/19 2317)  methylPREDNISolone sodium succinate (SOLU-MEDROL) 40 mg/mL injection 40 mg (has no administration in time range)  ipratropium-albuterol (DUONEB) 0.5-2.5 (3) MG/3ML nebulizer solution 3 mL (3 mLs Nebulization Given 08/23/19 2317)  sodium chloride flush (NS) 0.9 % injection 10-40 mL (has no administration in time range)  Chlorhexidine Gluconate Cloth 2 % PADS 6 each (has no administration in time range)  ipratropium (ATROVENT) nebulizer solution 0.5 mg (0.5 mg Nebulization Given 08/23/19 1750)  albuterol (VENTOLIN HFA) 108 (90 Base) MCG/ACT inhaler 4 puff (4 puffs Inhalation Given 08/23/19 1718)  methylPREDNISolone sodium succinate (SOLU-MEDROL) 125 mg/2 mL injection 125 mg (125 mg  Intravenous Given 08/23/19 1718)  magnesium sulfate IVPB 1 g 100 mL (0 g Intravenous Stopped 08/23/19 2041)    ED Course  I have reviewed the triage vital signs and the nursing notes.  Pertinent labs & imaging results that were available during my care of the patient were reviewed by me and considered in my medical decision making (see chart for details).  Clinical Course as of Aug 23 109  Mon Aug 23, 2019  1830 Labs reviewed. Metabolic panel unremarkable. CBC normal. BNP is not elevated.   [JK]  1031 Chest x-ray without acute firings   [JK]  1854 Still wheezing significantly.  Will give a dose of magnesium, additional neb   [JK]    Clinical Course User Index [JK] Dorie Rank, MD   MDM Rules/Calculators/A&P                      Pt presented with shortness of breath.  Smoking history.  CXR without pna.  No signs of chf.  Sx consistent with copd exacerbation.  Pt treated with albuterol steroids magnesium.  Continued to wheeze with increased work of breathing despite treatment.  Admitted for further treatment. Final Clinical Impression(s) / ED Diagnoses Final diagnoses:  COPD exacerbation ([place])      Dorie Rank, MD 08/24/19 [phone]

## 2019-08-23 NOTE — ED Notes (Signed)
Pt called out for restroom assistance.  RN advised that pt was ambulatory but that walking to bathroom would be less than ideal as pt is severely ShOB.  I offered to get a bedside commode or bedpan for pt, but pt adamantly refused and wanted to walk to RR.  Pt ambulatory to RR w/out assistance. Advised pt on how to notify staff w/ call cord when she is finished. Pt understood.

## 2019-08-23 NOTE — H&P (Signed)
History and Physical    Riot Barrick FXJ:[id number] DOB: 1945/08/10 DOA: 08/23/2019  PCP: [name], MD  Patient coming from: Home.  Chief Complaint: Shortness of breath.  HPI: Debra Rodgers is a 74 y.o. female with history of COPD, stage IV squamous cell lung cancer, hypertension presents to the ER with complaint of shortness of breath.  Patient has been short of breath for the last 3 days with wheezing and some nonproductive cough.  Denies any fever chills or chest pain.  ED Course: In the ER patient continues to be wheezing despite nebulizer treatment.  Chest x-ray was unremarkable EKG shows normal sinus rhythm with nonspecific changes.  Labs show WBC of 5.3 BNP 16.6 otherwise largely unremarkable.  Patient admitted from COPD exacerbation.  Covid test was negative.  Review of Systems: As per HPI, rest all negative.   Past Medical History:  Diagnosis Date  . COPD (chronic obstructive pulmonary disease) ([place])   . Depression   . Essential hypertension   . Headache   . History of migraine headaches   . lung ca dx'd 10/2018  . Sleep apnea   . Tobacco use disorder     Past Surgical History:  Procedure Laterality Date  . ABDOMINAL HYSTERECTOMY    . CHOLECYSTECTOMY    . IR IMAGING GUIDED PORT INSERTION  02/24/2019  . ROTATOR CUFF REPAIR  3/04  . VIDEO BRONCHOSCOPY WITH ENDOBRONCHIAL ULTRASOUND Right 11/25/2018   Procedure: VIDEO BRONCHOSCOPY WITH ENDOBRONCHIAL ULTRASOUND;  Surgeon: Collene Gobble, MD;  Location: [facility];  Service: Thoracic;  Laterality: Right;     reports that she quit smoking about 9 months ago. Her smoking use included cigarettes. She started smoking about 57 years ago. She has a 56.00 pack-year smoking history. She has never used smokeless tobacco. She reports that she does not drink alcohol or use drugs.  Allergies  Allergen Reactions  . Codeine Sulfate     REACTION: "makes me high"  . Pantoprazole Sodium Swelling and Rash    facial swelling     Family History  Problem Relation Age of Onset  . Hypertension Mother   . Stroke Mother   . Coronary artery disease Mother   . Heart disease Father   . Diabetes Sister   . Hypertension Sister   . Cancer Sister   . Breast cancer Sister     Prior to Admission medications   Medication Sig Start Date End Date Taking? Authorizing Provider  acetaminophen (TYLENOL) 325 MG tablet Take 2 tablets (650 mg total) by mouth every 6 (six) hours as needed. 11/05/18  Yes Varney Biles, MD  albuterol (VENTOLIN HFA) 108 (90 Base) MCG/ACT inhaler Inhale 2 puffs into the lungs every 6 (six) hours as needed for wheezing or shortness of breath. 07/23/19  Yes Collene Gobble, MD  amLODipine (NORVASC) 10 MG tablet Take 1 tablet (10 mg total) by mouth daily. 07/19/19  Yes [name], MD  atorvastatin (LIPITOR) 20 MG tablet Take 1 tablet (20 mg total) by mouth daily. 07/19/19  Yes [name], MD  chlorthalidone (HYGROTON) 50 MG tablet Take 1 tablet (50 mg total) by mouth daily. 07/19/19  Yes [name], MD  dextromethorphan (DELSYM) 30 MG/5ML liquid Take 60 mg by mouth as needed for cough.    Yes [provider]  famotidine (PEPCID) 20 MG tablet Take 20 mg by mouth daily.    Yes [provider]  FLUoxetine (PROZAC) 20 MG capsule Take 1 capsule (20 mg  total) by mouth daily. 07/19/19  Yes [name], MD  lidocaine-prilocaine (EMLA) cream Apply 1 application topically as needed. Patient taking differently: Apply 1 application topically as needed (pain).  07/28/19  Yes Heilingoetter, Cassandra L, PA-C  lisinopril (ZESTRIL) 10 MG tablet Take 1 tablet (10 mg total) by mouth daily. 07/19/19  Yes [name], MD  potassium chloride 20 MEQ TBCR Take 20 mEq by mouth daily. 07/28/19  Yes Heilingoetter, Cassandra L, PA-C  Tiotropium Bromide-Olodaterol (STIOLTO RESPIMAT) 2.5-2.5 MCG/ACT AERS Inhale 2 puffs into the lungs daily. 02/05/19  Yes Collene Gobble,  MD  HYDROcodone-acetaminophen (NORCO) 7.5-325 MG tablet Take 1 tablet by mouth every 6 (six) hours as needed for moderate pain. Patient not taking: Reported on 08/23/2019 12/30/18   Heilingoetter, Cassandra L, PA-C  nicotine (NICODERM CQ - DOSED IN MG/24 HOURS) 14 mg/24hr patch PLACE 1 PATCH (14 MG TOTAL) ONTO THE SKIN DAILY. Patient not taking: Reported on 08/23/2019 05/11/19   [name], MD  prochlorperazine (COMPAZINE) 10 MG tablet Take 1 tablet (10 mg total) by mouth every 6 (six) hours as needed for nausea or vomiting. Patient not taking: Reported on 08/23/2019 12/02/18   Heilingoetter, Cassandra L, PA-C  RIVAROXABAN (XARELTO) VTE STARTER PACK (15 & 20 MG TABLETS) Follow package directions: Take one 39m tablet by mouth twice a day. On day 22, switch to one 261mtablet once a day. Take with food. Patient not taking: Reported on 08/23/2019 07/12/19   Heilingoetter, Cassandra L, PA-C    Physical Exam: Constitutional: Moderately built and nourished. Vitals:   08/23/19 1930 08/23/19 1946 08/23/19 2000 08/23/19 2045  BP: 119/78  (!) 119/97 122/79  Pulse: 75  78 83  Resp: (!) 24  (!) 24 18  Temp:      SpO2: 100% 98% 100% 100%   Eyes: Anicteric no pallor. ENMT: No discharge from the ears eyes nose or mouth. Neck: No masses.  No neck rigidity. Respiratory: Bilateral expiratory wheeze and no crepitations. Cardiovascular: S1-S2 heard. Abdomen: Soft nontender bowel sounds present. Musculoskeletal: No edema. Skin: No rash. Neurologic: Alert awake oriented to time place and person.  Moves all extremities. Psychiatric: Appears normal per normal affect.   Labs on Admission: I have personally reviewed following labs and imaging studies  CBC: Recent Labs  Lab 08/18/19 1040 08/23/19 1715  WBC 4.9 5.3  NEUTROABS 2.1 2.2  HGB 11.7* 12.0  HCT 35.3* 36.3  MCV 96.2 98.9  PLT 166 18025 Basic Metabolic Panel: Recent Labs  Lab 08/18/19 1040 08/23/19 1715  NA 138 137  K 3.4* 3.6   CL 100 97*  CO2 29 29  GLUCOSE 91 100*  BUN 23 23  CREATININE 1.28* 0.99  CALCIUM 9.4 9.2   GFR: Estimated Creatinine Clearance: 44.2 mL/min (by C-G formula based on SCr of 0.99 mg/dL). Liver Function Tests: Recent Labs  Lab 08/18/19 1040  AST 12*  ALT 10  ALKPHOS 65  BILITOT 0.9  PROT 8.0  ALBUMIN 3.9   No results for input(s): LIPASE, AMYLASE in the last 168 hours. No results for input(s): AMMONIA in the last 168 hours. Coagulation Profile: No results for input(s): INR, PROTIME in the last 168 hours. Cardiac Enzymes: No results for input(s): CKTOTAL, CKMB, CKMBINDEX, TROPONINI in the last 168 hours. BNP (last 3 results) No results for input(s): PROBNP in the last 8760 hours. HbA1C: No results for input(s): HGBA1C in the last 72 hours. CBG: No results for input(s): GLUCAP in the last 168  hours. Lipid Profile: No results for input(s): CHOL, HDL, LDLCALC, TRIG, CHOLHDL, LDLDIRECT in the last 72 hours. Thyroid Function Tests: No results for input(s): TSH, T4TOTAL, FREET4, T3FREE, THYROIDAB in the last 72 hours. Anemia Panel: No results for input(s): VITAMINB12, FOLATE, FERRITIN, TIBC, IRON, RETICCTPCT in the last 72 hours. Urine analysis:    Component Value Date/Time   COLORURINE YELLOW 11/04/2018 0011   APPEARANCEUR CLEAR 11/04/2018 0011   LABSPEC 1.017 11/04/2018 0011   PHURINE 5.0 11/04/2018 0011   GLUCOSEU NEGATIVE 11/04/2018 0011   HGBUR NEGATIVE 11/04/2018 0011   BILIRUBINUR NEGATIVE 11/04/2018 0011   BILIRUBINUR NEGATIVE 09/22/2018 1642   KETONESUR NEGATIVE 11/04/2018 0011   PROTEINUR NEGATIVE 11/04/2018 0011   UROBILINOGEN 2.0 (A) 09/22/2018 1642   UROBILINOGEN 0.2 07/08/2013 1642   NITRITE NEGATIVE 11/04/2018 0011   LEUKOCYTESUR NEGATIVE 11/04/2018 0011   Sepsis Labs: @LABRCNTIP (procalcitonin:4,lacticidven:4) ) Recent Results (from the past 240 hour(s))  SARS Coronavirus 2 (TAT 6-24 hrs)     Status: None   Collection Time: 08/18/19 11:50 AM    Specimen: Nasopharyngeal Swab  Result Value Ref Range Status   SARS Coronavirus 2 NEGATIVE NEGATIVE Final    Comment: (NOTE) SARS-CoV-2 target nucleic acids are NOT DETECTED. The SARS-CoV-2 RNA is generally detectable in upper and lower respiratory specimens during the acute phase of infection. Negative results do not preclude SARS-CoV-2 infection, do not rule out co-infections with other pathogens, and should not be used as the sole basis for treatment or other patient management decisions. Negative results must be combined with clinical observations, patient history, and epidemiological information. The expected result is Negative. Fact Sheet for Patients: SugarRoll.be Fact Sheet for Healthcare Providers: https://www.woods-mathews.com/ This test is not yet approved or cleared by the [place] FDA and  has been authorized for detection and/or diagnosis of SARS-CoV-2 by FDA under an Emergency Use Authorization (EUA). This EUA will remain  in effect (meaning this test can be used) for the duration of the COVID-19 declaration under Section 56 4(b)(1) of the Act, 21 U.S.C. section 360bbb-3(b)(1), unless the authorization is terminated or revoked sooner. Performed at [facility], [place] [address] [place], [place] [address]   SARS Coronavirus 2 by RT PCR (hospital order, performed in [facility] hospital lab) Nasopharyngeal Nasopharyngeal Swab     Status: None   Collection Time: 08/23/19  5:15 PM   Specimen: Nasopharyngeal Swab  Result Value Ref Range Status   SARS Coronavirus 2 NEGATIVE NEGATIVE Final    Comment: (NOTE) SARS-CoV-2 target nucleic acids are NOT DETECTED. The SARS-CoV-2 RNA is generally detectable in upper and lower respiratory specimens during the acute phase of infection. The lowest concentration of SARS-CoV-2 viral copies this assay can detect is 250 copies / mL. A negative result does not preclude SARS-CoV-2  infection and should not be used as the sole basis for treatment or other patient management decisions.  A negative result may occur with improper specimen collection / handling, submission of specimen other than nasopharyngeal swab, presence of viral mutation(s) within the areas targeted by this assay, and inadequate number of viral copies (<250 copies / mL). A negative result must be combined with clinical observations, patient history, and epidemiological information. Fact Sheet for Patients:   StrictlyIdeas.no Fact Sheet for Healthcare Providers: BankingDealers.co.za This test is not yet approved or cleared  by the [place] FDA and has been authorized for detection and/or diagnosis of SARS-CoV-2 by FDA under an Emergency Use Authorization (EUA).  This EUA will remain in  effect (meaning this test can be used) for the duration of the COVID-19 declaration under Section 564(b)(1) of the Act, 21 U.S.C. section 360bbb-3(b)(1), unless the authorization is terminated or revoked sooner. Performed at [facility], [place] [address], [place], [place] [address]      Radiological Exams on Admission: DG Chest 2 View  Result Date: 08/23/2019 CLINICAL DATA:  74 year old female with cough and shortness of breath four days. History of lung cancer, on chemotherapy. EXAM: CHEST - 2 VIEW COMPARISON:  08/16/2019 chest CT and prior studies FINDINGS: The cardiomediastinal silhouette is unremarkable. A RIGHT IJ Port-A-Cath is again noted. LEFT basilar atelectasis again noted. There is no evidence of focal airspace disease, pulmonary edema, pulmonary mass, pleural effusion, or pneumothorax. The tiny nodules identified on recent CT are difficult to visualize radiographically. No acute bony abnormalities are identified. IMPRESSION: Unchanged LEFT basilar atelectasis. No new abnormalities identified. Electronically Signed   By: Margarette Canada M.D.    On: 08/23/2019 16:54    EKG: Independently reviewed.  Normal sinus rhythm with RSR pattern.  Assessment/Plan Principal Problem:   COPD exacerbation (HCC) Active Problems:   Hyperlipidemia   Essential hypertension   OSA (obstructive sleep apnea)   Abdominal aortic aneurysm (AAA) 35 to 39 mm in diameter (HCC)   Stage IV squamous cell carcinoma of right lung ([place])    1. COPD exacerbation for which I have placed patient on IV Solu-Medrol nebulizer and Pulmicort.  Closely monitor. 2. Hypertension on amlodipine lisinopril hydrochlorothiazide. 3. 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  Patient had discontinued her anticoagulation last week we will check D-dimer. 4. Stage IV lung cancer being followed by Dr. Julien Nordmann. 5. Anemia likely related to patient's malignancy.  Follow CBC.   DVT prophylaxis: Lovenox. Code Status: Full code. Family Communication: Discussed with patient. Disposition Plan: Home. Consults called: None. Admission status: Observation.   Rise Patience MD [facility] Pager [phone].  If 7PM-7AM, please contact night-coverage [email or web address] Password [facility]  08/23/2019, 9:38 PM

## 2019-08-23 NOTE — ED Notes (Signed)
Radiology at pt bedside

## 2019-08-24 ENCOUNTER — Observation Stay (HOSPITAL_COMMUNITY): Payer: Medicare HMO

## 2019-08-24 ENCOUNTER — Other Ambulatory Visit: Payer: Self-pay | Admitting: Medical

## 2019-08-24 DIAGNOSIS — Z90710 Acquired absence of both cervix and uterus: Secondary | ICD-10-CM | POA: Diagnosis not present

## 2019-08-24 DIAGNOSIS — F329 Major depressive disorder, single episode, unspecified: Secondary | ICD-10-CM | POA: Diagnosis present

## 2019-08-24 DIAGNOSIS — I714 Abdominal aortic aneurysm, without rupture: Secondary | ICD-10-CM

## 2019-08-24 DIAGNOSIS — J441 Chronic obstructive pulmonary disease with (acute) exacerbation: Secondary | ICD-10-CM | POA: Diagnosis present

## 2019-08-24 DIAGNOSIS — E785 Hyperlipidemia, unspecified: Secondary | ICD-10-CM

## 2019-08-24 DIAGNOSIS — C3491 Malignant neoplasm of unspecified part of right bronchus or lung: Secondary | ICD-10-CM

## 2019-08-24 DIAGNOSIS — I10 Essential (primary) hypertension: Secondary | ICD-10-CM | POA: Diagnosis present

## 2019-08-24 DIAGNOSIS — Z87891 Personal history of nicotine dependence: Secondary | ICD-10-CM | POA: Diagnosis not present

## 2019-08-24 DIAGNOSIS — J9601 Acute respiratory failure with hypoxia: Secondary | ICD-10-CM | POA: Diagnosis present

## 2019-08-24 DIAGNOSIS — D630 Anemia in neoplastic disease: Secondary | ICD-10-CM | POA: Diagnosis present

## 2019-08-24 DIAGNOSIS — Z86718 Personal history of other venous thrombosis and embolism: Secondary | ICD-10-CM | POA: Diagnosis not present

## 2019-08-24 DIAGNOSIS — Z9221 Personal history of antineoplastic chemotherapy: Secondary | ICD-10-CM | POA: Diagnosis not present

## 2019-08-24 DIAGNOSIS — G4733 Obstructive sleep apnea (adult) (pediatric): Secondary | ICD-10-CM

## 2019-08-24 DIAGNOSIS — Z20822 Contact with and (suspected) exposure to covid-19: Secondary | ICD-10-CM | POA: Diagnosis present

## 2019-08-24 LAB — BASIC METABOLIC PANEL
Anion gap: 10 (ref 5–15)
BUN: 21 mg/dL (ref 8–23)
CO2: 29 mmol/L (ref 22–32)
Calcium: 9.2 mg/dL (ref 8.9–10.3)
Chloride: 98 mmol/L (ref 98–111)
Creatinine, Ser: 1.01 mg/dL — ABNORMAL HIGH (ref 0.44–1.00)
GFR calc Af Amer: >60 mL/min (ref >60)
GFR calc non Af Amer: 56 mL/min — ABNORMAL LOW (ref >60)
Glucose, Bld: 149 mg/dL — ABNORMAL HIGH (ref 70–99)
Potassium: 3.6 mmol/L (ref 3.5–5.1)
Sodium: 137 mmol/L (ref 135–145)

## 2019-08-24 LAB — CBC
HCT: 35.6 % — ABNORMAL LOW (ref 36.0–46.0)
Hemoglobin: 11.6 g/dL — ABNORMAL LOW (ref 12.0–15.0)
MCH: 32.3 pg (ref 26.0–34.0)
MCHC: 32.6 g/dL (ref 30.0–36.0)
MCV: 99.2 fL (ref 80.0–100.0)
Platelets: 200 10*3/uL (ref 150–400)
RBC: 3.59 MIL/uL — ABNORMAL LOW (ref 3.87–5.11)
RDW: 13.2 % (ref 11.5–15.5)
WBC: 6.1 10*3/uL (ref 4.0–10.5)
nRBC: 0 % (ref 0.0–0.2)

## 2019-08-24 MED ORDER — SODIUM CHLORIDE (PF) 0.9 % IJ SOLN
INTRAMUSCULAR | Status: AC
  Filled 2019-08-24: qty 50

## 2019-08-24 MED ORDER — AZITHROMYCIN 250 MG PO TABS
500.0000 mg | ORAL_TABLET | Freq: Every day | ORAL | Status: DC
  Administered 2019-08-24 – 2019-08-26 (×3): 500 mg via ORAL
  Filled 2019-08-24 (×3): qty 2

## 2019-08-24 MED ORDER — IOHEXOL 350 MG/ML SOLN
80.0000 mL | Freq: Once | INTRAVENOUS | Status: AC | PRN
  Administered 2019-08-24: 80 mL via INTRAVENOUS

## 2019-08-24 MED ORDER — IPRATROPIUM-ALBUTEROL 0.5-2.5 (3) MG/3ML IN SOLN
3.0000 mL | Freq: Four times a day (QID) | RESPIRATORY_TRACT | Status: DC
  Administered 2019-08-24 – 2019-08-25 (×6): 3 mL via RESPIRATORY_TRACT
  Filled 2019-08-24 (×5): qty 3

## 2019-08-24 MED ORDER — GUAIFENESIN-DM 100-10 MG/5ML PO SYRP
5.0000 mL | ORAL_SOLUTION | ORAL | Status: DC | PRN
  Administered 2019-08-24 – 2019-08-25 (×2): 5 mL via ORAL
  Filled 2019-08-24 (×2): qty 10

## 2019-08-24 MED ORDER — ACETAMINOPHEN 325 MG PO TABS
650.0000 mg | ORAL_TABLET | ORAL | Status: DC | PRN
  Administered 2019-08-24 – 2019-08-26 (×4): 650 mg via ORAL
  Filled 2019-08-24 (×4): qty 2

## 2019-08-24 MED ORDER — METHYLPREDNISOLONE SODIUM SUCC 40 MG IJ SOLR
40.0000 mg | Freq: Three times a day (TID) | INTRAMUSCULAR | Status: DC
  Administered 2019-08-24 – 2019-08-25 (×3): 40 mg via INTRAVENOUS
  Filled 2019-08-24 (×3): qty 1

## 2019-08-24 NOTE — Progress Notes (Addendum)
Pt arrived to the unit via ED stretcher on 2L of 02. Pt has been oriented to unit and equipment. Pt does not appear to be in distress at this time vss. Pt advised to call staff prior to getting out of bed. Pt has no request at this time. Bed at lowest position and wheels locked. Will continue to monitor.

## 2019-08-24 NOTE — Progress Notes (Signed)
Brief oncology note:  Notified by hospitalist of the patient's admission for COPD exacerbation.  Courtesy visit performed today.  CT angiogram of the chest and labs reviewed.  CBC shows mild anemia.  Her hemoglobin is 11.6 and no transfusion is indicated once her hemoglobin drops below 8.  CT angiogram chest showed no PE and was without evidence of disease progression.  The patient is scheduled for outpatient follow-up with Her on 09/08/2019.  Recommend for her to keep her outpatient follow-up as previously scheduled.  Please contact oncology if any additional questions arise during this hospitalization.  Mikey Bussing, DNP, AGPCNP-BC, AOCNP Mon/Tues/Thurs/Fri 7am-5pm; Off Wednesdays Cell: [phone]

## 2019-08-24 NOTE — Progress Notes (Signed)
PROGRESS NOTE  Debra Rodgers  GEX:[id number] DOB: Jul 13, 1945 DOA: 08/23/2019 PCP: [name], MD   Brief Narrative: Debra Rodgers is a 74 y.o. female with a history of COPD, stage IV lung cancer, OSA, and HTN who presented to the ED with 3 days of shortness of breath, nonproductive cough and wheezing on arrival. CXR showed stable left base atelectasis, no new infiltrate, WBC 5.3k, BNP 16.6, SARS-CoV-2 negative, no ST deviations on ECG. Breathing treatments and steroids given, though hypoxia and wheezing persisted prompting admission. D-dimer 2.14, though subsequent CTA chest showed no PE.  Assessment & Plan: Principal Problem:   COPD exacerbation ([place]) Active Problems:   Hyperlipidemia   Essential hypertension   OSA (obstructive sleep apnea)   Abdominal aortic aneurysm (AAA) 35 to 39 mm in diameter (HCC)   Stage IV squamous cell carcinoma of right lung (HCC)   Acute exacerbation of chronic obstructive pulmonary disease (COPD) (HCC)  Acute hypoxemic respiratory failure due to COPD exacerbation complicated by lung cancer: Possible precipitant is allergies vs. viral illness vs. atypical pneumonia - Continue steroids, will augment IV steroids with continued wheezing.  - Start azithromycin - Continue bronchodilators. Covid negative, can give nebs. Schedule duonebs QID, continue prn albuterol.  Stage IV squamous lung CA: Dx Sept 2020. Pt has completed chemotherapy with carboplatin and paclitaxel and is now on immunotherapy with pembrolizumab every 3 weeks.  - Continue regular follow up with Dr. Julien Nordmann. Made oncology aware of unrelated admission. CTA chest also revealed stable, not progressed disease.   HTN:  - Continue home norvasc, lisinopril, chlorthalidone. Not on beta blocker.   HLD:  - Continue statin  History of port-associated IJ thrombus: Incidentally noticed on staging CT previously, no longer taking xarelto per pt, it is unclear whether this was to be continued  longer, though no DVT was noted on upper extremity U/S ordered by PCP in April. No clot noted on CTA chest today. Will continue with prophylactic lovenox while admitted.  Anemia of chronic disease/malignancy:  - Monitor intermittently. At baseline.  Depression:  - Cotn[place]ue SSRI  DVT prophylaxis: Lovenox Code Status: Full Family Communication: None at bedside Disposition Plan:  Status is: Inpatient  Remains inpatient appropriate because:Inpatient level of care appropriate due to severity of illness and Remains hypoxemic with wheezing, not near respiratory baseline. Augmenting steroids, regular breathing treatments, so will require inpatient level of care beyond observation period.  Dispo: The patient is from: Home              Anticipated d/c is to: Home              Anticipated d/c date is: 2 days              Patient currently is not medically stable to d/c.  Consultants:   Oncology notified  Procedures:   None  Antimicrobials:  Azithromycin 6/1 >    Subjective: Still feels short of breath at rest, worse with exertion, which is not her baseline. Continues to have wheezing. No chest pain, leg swelling, orthopnea.  Objective: Vitals:   08/23/19 2317 08/24/19 0508 08/24/19 1114 08/24/19 1309  BP:  124/90  119/77  Pulse:  85  92  Resp:  (!) 22  18  Temp:  97.9 F (36.6 C)  98.1 F (36.7 C)  TempSrc:  Oral  Oral  SpO2: 97% 96% 94% 96%  Weight:      Height:        Intake/Output Summary (Last 24 hours) at 08/24/2019  1412 Last data filed at 08/24/2019 4818 Gross per 24 hour  Intake 340 ml  Output --  Net 340 ml   Filed Weights   08/23/19 2241  Weight: 63.2 kg    Gen: 74 y.o. female in no distress  Pulm: Non-labored tachypnea with panexpiratory wheezing in all lung fields.  CV: Regular rate and rhythm. No murmur, rub, or gallop. No JVD, no pedal edema. GI: Abdomen soft, non-tender, non-distended, with normoactive bowel sounds. No organomegaly or masses  felt. Ext: Warm, no deformities Skin: No rashes, lesions or ulcers. Right port site c/d/i, no erythema or tenderness.  Neuro: Alert and oriented. No focal neurological deficits. Psych: Judgement and insight appear normal. Mood & affect appropriate.   Data Reviewed: I have personally reviewed following labs and imaging studies  CBC: Recent Labs  Lab 08/18/19 1040 08/23/19 1715 08/24/19 0400  WBC 4.9 5.3 6.1  NEUTROABS 2.1 2.2  --   HGB 11.7* 12.0 11.6*  HCT 35.3* 36.3 35.6*  MCV 96.2 98.9 99.2  PLT 166 188 563   Basic Metabolic Panel: Recent Labs  Lab 08/18/19 1040 08/23/19 1715 08/24/19 0400  NA 138 137 137  K 3.4* 3.6 3.6  CL 100 97* 98  CO2 29 29 29   GLUCOSE 91 100* 149*  BUN 23 23 21   CREATININE 1.28* 0.99 1.01*  CALCIUM 9.4 9.2 9.2   GFR: Estimated Creatinine Clearance: 43.5 mL/min (A) (by C-G formula based on SCr of 1.01 mg/dL (H)). Liver Function Tests: Recent Labs  Lab 08/18/19 1040  AST 12*  ALT 10  ALKPHOS 65  BILITOT 0.9  PROT 8.0  ALBUMIN 3.9   No results for input(s): LIPASE, AMYLASE in the last 168 hours. No results for input(s): AMMONIA in the last 168 hours. Coagulation Profile: No results for input(s): INR, PROTIME in the last 168 hours. Cardiac Enzymes: No results for input(s): CKTOTAL, CKMB, CKMBINDEX, TROPONINI in the last 168 hours. BNP (last 3 results) No results for input(s): PROBNP in the last 8760 hours. HbA1C: No results for input(s): HGBA1C in the last 72 hours. CBG: No results for input(s): GLUCAP in the last 168 hours. Lipid Profile: No results for input(s): CHOL, HDL, LDLCALC, TRIG, CHOLHDL, LDLDIRECT in the last 72 hours. Thyroid Function Tests: No results for input(s): TSH, T4TOTAL, FREET4, T3FREE, THYROIDAB in the last 72 hours. Anemia Panel: No results for input(s): VITAMINB12, FOLATE, FERRITIN, TIBC, IRON, RETICCTPCT in the last 72 hours. Urine analysis:    Component Value Date/Time   COLORURINE YELLOW 11/04/2018  0011   APPEARANCEUR CLEAR 11/04/2018 0011   LABSPEC 1.017 11/04/2018 0011   PHURINE 5.0 11/04/2018 0011   GLUCOSEU NEGATIVE 11/04/2018 0011   HGBUR NEGATIVE 11/04/2018 0011   BILIRUBINUR NEGATIVE 11/04/2018 0011   BILIRUBINUR NEGATIVE 09/22/2018 1642   KETONESUR NEGATIVE 11/04/2018 0011   PROTEINUR NEGATIVE 11/04/2018 0011   UROBILINOGEN 2.0 (A) 09/22/2018 1642   UROBILINOGEN 0.2 07/08/2013 1642   NITRITE NEGATIVE 11/04/2018 0011   LEUKOCYTESUR NEGATIVE 11/04/2018 0011   Recent Results (from the past 240 hour(s))  SARS Coronavirus 2 (TAT 6-24 hrs)     Status: None   Collection Time: 08/18/19 11:50 AM   Specimen: Nasopharyngeal Swab  Result Value Ref Range Status   SARS Coronavirus 2 NEGATIVE NEGATIVE Final    Comment: (NOTE) SARS-CoV-2 target nucleic acids are NOT DETECTED. The SARS-CoV-2 RNA is generally detectable in upper and lower respiratory specimens during the acute phase of infection. Negative results do not preclude SARS-CoV-2 infection, do not  rule out co-infections with other pathogens, and should not be used as the sole basis for treatment or other patient management decisions. Negative results must be combined with clinical observations, patient history, and epidemiological information. The expected result is Negative. Fact Sheet for Patients: SugarRoll.be Fact Sheet for Healthcare Providers: https://www.woods-mathews.com/ This test is not yet approved or cleared by the [place] FDA and  has been authorized for detection and/or diagnosis of SARS-CoV-2 by FDA under an Emergency Use Authorization (EUA). This EUA will remain  in effect (meaning this test can be used) for the duration of the COVID-19 declaration under Section 56 4(b)(1) of the Act, 21 U.S.C. section 360bbb-3(b)(1), unless the authorization is terminated or revoked sooner. Performed at [facility], [place] [address], [place], [place] [address]    SARS Coronavirus 2 by RT PCR (hospital order, performed in [facility] hospital lab) Nasopharyngeal Nasopharyngeal Swab     Status: None   Collection Time: 08/23/19  5:15 PM   Specimen: Nasopharyngeal Swab  Result Value Ref Range Status   SARS Coronavirus 2 NEGATIVE NEGATIVE Final    Comment: (NOTE) SARS-CoV-2 target nucleic acids are NOT DETECTED. The SARS-CoV-2 RNA is generally detectable in upper and lower respiratory specimens during the acute phase of infection. The lowest concentration of SARS-CoV-2 viral copies this assay can detect is 250 copies / mL. A negative result does not preclude SARS-CoV-2 infection and should not be used as the sole basis for treatment or other patient management decisions.  A negative result may occur with improper specimen collection / handling, submission of specimen other than nasopharyngeal swab, presence of viral mutation(s) within the areas targeted by this assay, and inadequate number of viral copies (<250 copies / mL). A negative result must be combined with clinical observations, patient history, and epidemiological information. Fact Sheet for Patients:   StrictlyIdeas.no Fact Sheet for Healthcare Providers: BankingDealers.co.za This test is not yet approved or cleared  by the [place] FDA and has been authorized for detection and/or diagnosis of SARS-CoV-2 by FDA under an Emergency Use Authorization (EUA).  This EUA will remain in effect (meaning this test can be used) for the duration of the COVID-19 declaration under Section 564(b)(1) of the Act, 21 U.S.C. section 360bbb-3(b)(1), unless the authorization is terminated or revoked sooner. Performed at [facility], [place] [address], [place], [place] [address]       Radiology Studies: DG Chest 2 View  Result Date: 08/23/2019 CLINICAL DATA:  74 year old female with cough and shortness of breath four days. History of  lung cancer, on chemotherapy. EXAM: CHEST - 2 VIEW COMPARISON:  08/16/2019 chest CT and prior studies FINDINGS: The cardiomediastinal silhouette is unremarkable. A RIGHT IJ Port-A-Cath is again noted. LEFT basilar atelectasis again noted. There is no evidence of focal airspace disease, pulmonary edema, pulmonary mass, pleural effusion, or pneumothorax. The tiny nodules identified on recent CT are difficult to visualize radiographically. No acute bony abnormalities are identified. IMPRESSION: Unchanged LEFT basilar atelectasis. No new abnormalities identified. Electronically Signed   By: Margarette Canada M.D.   On: 08/23/2019 16:54   CT ANGIO CHEST PE W OR WO CONTRAST  Result Date: 08/24/2019 CLINICAL DATA:  Shortness of breath for a few days. History of lung cancer EXAM: CT ANGIOGRAPHY CHEST WITH CONTRAST TECHNIQUE: Multidetector CT imaging of the chest was performed using the standard protocol during bolus administration of intravenous contrast. Multiplanar CT image reconstructions and MIPs were obtained to evaluate the vascular anatomy. CONTRAST:  52m OMNIPAQUE IOHEXOL 350 MG/ML SOLN COMPARISON:  08/16/2019 FINDINGS: Cardiovascular: Satisfactory opacification of the pulmonary arteries to the segmental level. No evidence of pulmonary embolism when allowing for areas of motion artifact. Normal heart size. No pericardial effusion. Extensive aortic atherosclerosis. 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 This will be re-evaluated with a close follow-up interval in this patient with active malignancy. Extensive atheromatous plaque involving the aorta, mixed density. Porta catheter in good position. Mediastinum/Nodes: No interval enlargement of lymph nodes which were better evaluated on prior study with parenchymal timing Lungs/Pleura: Centrilobular emphysema and airway thickening with scattered mucoid impaction. Small pulmonary nodules  as enumerated on recent staging scan. This study is affected by motion. There is no edema, consolidation, effusion, or pneumothorax. Upper Abdomen: No acute finding. Musculoskeletal: Extensive disc degeneration with mild scoliosis. Review of the MIP images confirms the above findings. IMPRESSION: 1. No evidence of pulmonary embolism. No acute or interval finding when compared to recent staging CT. 2. Motion degraded. Electronically Signed   By: JMonte FantasiaM.D.   On: 08/24/2019 09:59    Scheduled Meds: . amLODipine  10 mg Oral Daily  . atorvastatin  20 mg Oral Daily  . budesonide (PULMICORT) nebulizer solution  0.25 mg Nebulization BID  . Chlorhexidine Gluconate Cloth  6 each Topical Daily  . chlorthalidone  50 mg Oral Daily  . enoxaparin (LOVENOX) injection  40 mg Subcutaneous QHS  . famotidine  20 mg Oral Daily  . FLUoxetine  20 mg Oral Daily  . ipratropium-albuterol  3 mL Nebulization Q6H  . lisinopril  10 mg Oral Daily  . methylPREDNISolone (SOLU-MEDROL) injection  40 mg Intravenous Q12H  . potassium chloride SA  20 mEq Oral Daily  . sodium chloride (PF)       Continuous Infusions:   LOS: 0 days   Time spent: 25 minutes.  RPatrecia Pour MD [facility] [email or web address] 08/24/2019, 2:12 PM

## 2019-08-25 MED ORDER — IPRATROPIUM-ALBUTEROL 0.5-2.5 (3) MG/3ML IN SOLN
3.0000 mL | Freq: Four times a day (QID) | RESPIRATORY_TRACT | Status: DC
  Administered 2019-08-26: 3 mL via RESPIRATORY_TRACT
  Filled 2019-08-25: qty 3

## 2019-08-25 MED ORDER — PREDNISONE 50 MG PO TABS
60.0000 mg | ORAL_TABLET | Freq: Every day | ORAL | Status: DC
  Administered 2019-08-25 – 2019-08-26 (×2): 60 mg via ORAL
  Filled 2019-08-25 (×2): qty 1

## 2019-08-25 NOTE — Evaluation (Addendum)
Physical Therapy Evaluation Patient Details Name: Debra Rodgers MRN: [id number] DOB: [birth year] Today's Date: 08/25/2019   History of Present Illness  Debra Rodgers is a 74 y.o. female with a history of COPD, stage IV lung cancer, OSA, and HTN who presented to the ED with 3 days of shortness of breath, nonproductive cough and wheezing on arrival.    Clinical Impression  Patient evaluated by Physical Therapy with no further acute PT needs identified. All education has been completed and the patient has no further questions. Patient is functioning close to baseline level for mobility and is safe to mobilize with RN staff and supervision. She was able to ambulate ~280' on RA and SpO2 remained 94% or greater. Pt c/o SOB and 3/4 on dyspnea scale.  See below for any follow-up Physical Therapy or equipment needs. PT is signing off. Thank you for this referral.     Follow Up Recommendations No PT follow up    Equipment Recommendations  None recommended by PT    Recommendations for Other Services       Precautions / Restrictions Precautions Precautions: Fall Restrictions Weight Bearing Restrictions: No      Mobility  Bed Mobility Overal bed mobility: Modified Independent                Transfers Overall transfer level: Needs assistance Equipment used: None Transfers: Sit to/from Stand Sit to Stand: Supervision         General transfer comment: no cues needed for safety, pt steady with rising.  Ambulation/Gait Ambulation/Gait assistance: Supervision Gait Distance (Feet): 280 Feet Assistive device: None Gait Pattern/deviations: Step-through pattern;Decreased step length - left;Decreased step length - right;Decreased stride length Gait velocity: decreased   General Gait Details: pt abel to maintain balance with no device during gait. no overt LOB noted. pt pausing 4x during gait due to fatigue/SOB. pt reaching for railing in hall intermittently but able to maintain steady  gait without support.  Stairs       Wheelchair Mobility    Modified Rankin (Stroke Patients Only)       Balance Overall balance assessment: Mild deficits observed, not formally tested          Single Leg Stance - Right Leg: 14 Single Leg Stance - Left Leg: 15                  Pertinent Vitals/Pain Pain Assessment: 0-10 Pain Score: 4  Pain Location: abdomen where she gets her shots Pain Descriptors / Indicators: Discomfort;Sore Pain Intervention(s): Limited activity within patient's tolerance;Monitored during session    [place] expects to be discharged to:: Private residence Living Arrangements: Spouse/significant other Available Help at Discharge: Family Type of Home: House Home Access: Stairs to enter Entrance Stairs-Rails: Left Entrance Stairs-Number of Steps: 3 Home Layout: One level;Laundry or work area in [employer]: [place];Shower seat;Bedside commode;Cane - single point Additional Comments: pt also has 5 dogs at home    Prior Function Level of Independence: Independent         Comments: pt really enjoys baking.     Hand Dominance   Dominant Hand: Right    Extremity/Trunk Assessment   Upper Extremity Assessment Upper Extremity Assessment: Overall WFL for tasks assessed    Lower Extremity Assessment Lower Extremity Assessment: Overall WFL for tasks assessed    Cervical / Trunk Assessment Cervical / Trunk Assessment: Normal  Communication   Communication: No difficulties  Cognition Arousal/Alertness: Awake/alert Behavior During Therapy: WFL for  tasks assessed/performed Overall Cognitive Status: Within Functional Limits for tasks assessed       General Comments      Exercises Other Exercises Other Exercises: pursed lip breathing., 10x sitting after gait.   Assessment/Plan    PT Assessment Patent does not need any further PT services  PT Problem List         PT Treatment  Interventions      PT Goals (Current goals can be found in the Care Plan section)  Acute Rehab PT Goals Patient Stated Goal: get home PT Goal Formulation: All assessment and education complete, DC therapy Time For Goal Achievement: 08/25/19 Potential to Achieve Goals: Good    Frequency  1x eval/treat    AM-PAC PT "6 Clicks" Mobility  Outcome Measure Help needed turning from your back to your side while in a flat bed without using bedrails?: None Help needed moving from lying on your back to sitting on the side of a flat bed without using bedrails?: None Help needed moving to and from a bed to a chair (including a wheelchair)?: None Help needed standing up from a chair using your arms (e.g., wheelchair or bedside chair)?: None Help needed to walk in hospital room?: A Little Help needed climbing 3-5 steps with a railing? : A Little 6 Click Score: 22    End of Session Equipment Utilized During Treatment: Gait belt Activity Tolerance: Patient tolerated treatment well Patient left: in chair;with call bell/phone within reach Nurse Communication: Mobility status PT Visit Diagnosis: Other abnormalities of gait and mobility (R26.89)    Time: 1207-1229 PT Time Calculation (min) (ACUTE ONLY): 22 min   Charges:   PT Evaluation $PT Eval Moderate Complexity: 1 Mod          Verner Mould, DPT Physical Therapist with [facility] [phone]  08/25/2019 3:59 PM

## 2019-08-25 NOTE — Progress Notes (Signed)
PROGRESS NOTE    Patient: Debra Rodgers                            PCP: [name], MD                    DOB: 1945/10/14            DOA: 08/23/2019 MVH:[id number]             DOS: 08/25/2019, 11:23 AM   LOS: 1 day   Date of Service: The patient was seen and examined on 08/25/2019  Subjective:   The patient was seen and examined this Am. Stable, although she remains on room air, continues to have shortness of breath, cough especially with exertion Otherwise no issues overnight .  Brief Narrative:  Debra Rodgers is a 74 y.o. female with a history of COPD, stage IV lung cancer, OSA, and HTN who presented to the ED with 3 days of shortness of breath, nonproductive cough and wheezing on arrival. CXR showed stable left base atelectasis, no new infiltrate, WBC 5.3k, BNP 16.6, SARS-CoV-2 negative, no ST deviations on ECG. Breathing treatments and steroids given, though hypoxia and wheezing persisted prompting admission. D-dimer 2.14, though subsequent CTA chest showed no PE.    Assessment & Plan:   Principal Problem:   COPD exacerbation ([place]) Active Problems:   Hyperlipidemia   Essential hypertension   OSA (obstructive sleep apnea)   Abdominal aortic aneurysm (AAA) 35 to 39 mm in diameter (HCC)   Stage IV squamous cell carcinoma of right lung (HCC)   Acute exacerbation of chronic obstructive pulmonary disease (COPD) (HCC)   Acute hypoxemic respiratory failure due to COPD exacerbation complicated by lung cancer: -  Possible precipitant is allergies vs. viral illness vs. atypical pneumonia -Patient has been weaned off oxygen, with minimal exertion becomes shortness of breath, coughing spells -SARS-CoV-2 negative - Continue steroids, will augment IV/  steroids -will be switched to p.o. steroids, - Start azithromycin 6/1  - Continue bronchodilators. Covid negative, can give nebs. Schedule duonebs QID, continue prn albuterol.  Stage IV squamous lung CA: -Oncology consulted  recommended to follow-up as an outpatient no immediate recommendation  - Dx Sept 2020. Pt has completed chemotherapy with carboplatin and paclitaxel and is now on immunotherapy with pembrolizumab every 3 weeks.  - Continue regular follow up with Dr. Julien Nordmann. Made oncology aware of unrelated admission. CTA chest also revealed stable, not progressed disease.   HTN:  - Continue home norvasc, lisinopril, chlorthalidone. Not on beta blocker.   HLD:  - Continue statin  History of port-associated IJ thrombus: Incidentally noticed on staging CT previously, no longer taking xarelto per pt, it is unclear whether this was to be continued longer, though no DVT was noted on upper extremity U/S ordered by PCP in April. No clot noted on CTA chest today. Will continue with prophylactic lovenox while admitted.  Anemia of chronic disease/malignancy:  - Monitor intermittently. At baseline.  Depression:  - Cotninue SSRI       Nutritional status:            Cultures;   Antimicrobials: -Augmentin/azithromycin 6/1     Consultants: Oncology   ------------------------------------------------------------------------------------------------------------------------------------------------  DVT prophylaxis: SCD/Compression stockings and Lovenox SQ Code Status:   Code Status: Full Code Family Communication: No family member present at bedside- attempt will be made to update daily The above findings and plan of care has been discussed with  patient (and family )  in detail,  they expressed understanding and agreement of above. -Advance care planning has been discussed.   Admission status:   Status is: Inpatient  Remains inpatient appropriate because:Hemodynamically unstable, becomes hypoxic with minimal exertion, cough expects   Dispo: The patient is from: Home              Anticipated d/c is to: Home              Anticipated d/c date is: 1 day              Patient currently is not  medically stable to d/c.  Immunocompromised patient on chemotherapy for lung CA presenting with hypoxia cough, sputum production. Requiring IV antibiotics DuoNeb bronchodilators, steroids           Procedures:   No admission procedures for hospital encounter.     Antimicrobials:  Anti-infectives (From admission, onward)   Start     Dose/Rate Route Frequency Ordered Stop   08/24/19 1600  azithromycin (ZITHROMAX) tablet 500 mg     500 mg Oral Daily 08/24/19 1450         Medication:  . amLODipine  10 mg Oral Daily  . atorvastatin  20 mg Oral Daily  . azithromycin  500 mg Oral Daily  . budesonide (PULMICORT) nebulizer solution  0.25 mg Nebulization BID  . Chlorhexidine Gluconate Cloth  6 each Topical Daily  . chlorthalidone  50 mg Oral Daily  . enoxaparin (LOVENOX) injection  40 mg Subcutaneous QHS  . famotidine  20 mg Oral Daily  . FLUoxetine  20 mg Oral Daily  . ipratropium-albuterol  3 mL Nebulization Q6H  . lisinopril  10 mg Oral Daily  . potassium chloride SA  20 mEq Oral Daily  . predniSONE  60 mg Oral Q breakfast    acetaminophen, albuterol, guaiFENesin-dextromethorphan, ondansetron **OR** ondansetron (ZOFRAN) IV, sodium chloride flush   Objective:   Vitals:   08/25/19 0136 08/25/19 0613 08/25/19 0739 08/25/19 0941  BP:  132/79  119/72  Pulse:  85  85  Resp:  (!) 22  15  Temp:  99.1 F (37.3 C)  98.8 F (37.1 C)  TempSrc:  Oral  Oral  SpO2: 98% 93% 91% 94%  Weight:      Height:        Intake/Output Summary (Last 24 hours) at 08/25/2019 1123 Last data filed at 08/25/2019 0430 Gross per 24 hour  Intake 20 ml  Output -  Net 20 ml   Filed Weights   08/23/19 2241  Weight: 63.2 kg     Examination:   Physical Exam  Constitution: Stable, respite distress coughing spell with minimal exertion alert, cooperative, no distress,  Appears calm and comfortable  Psychiatric: Normal and stable mood and affect, cognition intact,   HEENT: Normocephalic,  PERRL, otherwise with in Normal limits  Chest:Chest symmetric Cardio vascular:  S1/S2, RRR, No murmure, No Rubs or Gallops  pulmonary: Clear to auscultation bilaterally, respirations unlabored, negative wheezes / crackles Abdomen: Soft, non-tender, non-distended, bowel sounds,no masses, no organomegaly Muscular skeletal: Limited exam - in bed, able to move all 4 extremities, Normal strength,  Neuro: CNII-XII intact. , normal motor and sensation, reflexes intact  Extremities: No pitting edema lower extremities, +2 pulses  Skin: Dry, warm to touch, negative for any Rashes, No open wounds Wounds: per nursing documentation    ------------------------------------------------------------------------------------------------------------------------------------------    LABs:  CBC Latest Ref Rng & Units 08/24/2019 08/23/2019 08/18/2019  WBC 4.0 -  10.5 K/uL 6.1 5.3 4.9  Hemoglobin 12.0 - 15.0 g/dL 11.6(L) 12.0 11.7(L)  Hematocrit 36.0 - 46.0 % 35.6(L) 36.3 35.3(L)  Platelets 150 - 400 K/uL 200 188 166   CMP Latest Ref Rng & Units 08/24/2019 08/23/2019 08/18/2019  Glucose 70 - 99 mg/dL 149(H) 100(H) 91  BUN 8 - 23 mg/dL 21 23 23   Creatinine 0.44 - 1.00 mg/dL 1.01(H) 0.99 1.28(H)  Sodium 135 - 145 mmol/L 137 137 138  Potassium 3.5 - 5.1 mmol/L 3.6 3.6 3.4(L)  Chloride 98 - 111 mmol/L 98 97(L) 100  CO2 22 - 32 mmol/L 29 29 29   Calcium 8.9 - 10.3 mg/dL 9.2 9.2 9.4  Total Protein 6.5 - 8.1 g/dL - - 8.0  Total Bilirubin 0.3 - 1.2 mg/dL - - 0.9  Alkaline Phos 38 - 126 U/L - - 65  AST 15 - 41 U/L - - 12(L)  ALT 0 - 44 U/L - - 10       Micro Results Recent Results (from the past 240 hour(s))  SARS Coronavirus 2 (TAT 6-24 hrs)     Status: None   Collection Time: 08/18/19 11:50 AM   Specimen: Nasopharyngeal Swab  Result Value Ref Range Status   SARS Coronavirus 2 NEGATIVE NEGATIVE Final    Comment: (NOTE) SARS-CoV-2 target nucleic acids are NOT DETECTED. The SARS-CoV-2 RNA is generally  detectable in upper and lower respiratory specimens during the acute phase of infection. Negative results do not preclude SARS-CoV-2 infection, do not rule out co-infections with other pathogens, and should not be used as the sole basis for treatment or other patient management decisions. Negative results must be combined with clinical observations, patient history, and epidemiological information. The expected result is Negative. Fact Sheet for Patients: SugarRoll.be Fact Sheet for Healthcare Providers: https://www.woods-mathews.com/ This test is not yet approved or cleared by the [place] FDA and  has been authorized for detection and/or diagnosis of SARS-CoV-2 by FDA under an Emergency Use Authorization (EUA). This EUA will remain  in effect (meaning this test can be used) for the duration of the COVID-19 declaration under Section 56 4(b)(1) of the Act, 21 U.S.C. section 360bbb-3(b)(1), unless the authorization is terminated or revoked sooner. Performed at [facility], [place] [address], [place], [place] [address]   SARS Coronavirus 2 by RT PCR (hospital order, performed in [facility] hospital lab) Nasopharyngeal Nasopharyngeal Swab     Status: None   Collection Time: 08/23/19  5:15 PM   Specimen: Nasopharyngeal Swab  Result Value Ref Range Status   SARS Coronavirus 2 NEGATIVE NEGATIVE Final    Comment: (NOTE) SARS-CoV-2 target nucleic acids are NOT DETECTED. The SARS-CoV-2 RNA is generally detectable in upper and lower respiratory specimens during the acute phase of infection. The lowest concentration of SARS-CoV-2 viral copies this assay can detect is 250 copies / mL. A negative result does not preclude SARS-CoV-2 infection and should not be used as the sole basis for treatment or other patient management decisions.  A negative result may occur with improper specimen collection / handling, submission of specimen other than  nasopharyngeal swab, presence of viral mutation(s) within the areas targeted by this assay, and inadequate number of viral copies (<250 copies / mL). A negative result must be combined with clinical observations, patient history, and epidemiological information. Fact Sheet for Patients:   StrictlyIdeas.no Fact Sheet for Healthcare Providers: BankingDealers.co.za This test is not yet approved or cleared  by the [place] FDA and has been authorized  for detection and/or diagnosis of SARS-CoV-2 by FDA under an Emergency Use Authorization (EUA).  This EUA will remain in effect (meaning this test can be used) for the duration of the COVID-19 declaration under Section 564(b)(1) of the Act, 21 U.S.C. section 360bbb-3(b)(1), unless the authorization is terminated or revoked sooner. Performed at [facility], [place] [address], [place], [place] [address]     Radiology Reports DG Chest 2 View  Result Date: 08/23/2019 CLINICAL DATA:  74 year old female with cough and shortness of breath four days. History of lung cancer, on chemotherapy. EXAM: CHEST - 2 VIEW COMPARISON:  08/16/2019 chest CT and prior studies FINDINGS: The cardiomediastinal silhouette is unremarkable. A RIGHT IJ Port-A-Cath is again noted. LEFT basilar atelectasis again noted. There is no evidence of focal airspace disease, pulmonary edema, pulmonary mass, pleural effusion, or pneumothorax. The tiny nodules identified on recent CT are difficult to visualize radiographically. No acute bony abnormalities are identified. IMPRESSION: Unchanged LEFT basilar atelectasis. No new abnormalities identified. Electronically Signed   By: Margarette Canada M.D.   On: 08/23/2019 16:54   CT Chest W Contrast  Result Date: 08/16/2019 CLINICAL DATA:  Squamous cell carcinoma of lung. Chemotherapy and immunotherapy on going. Cough for 1 week. EXAM: CT CHEST, ABDOMEN, AND PELVIS WITH CONTRAST  TECHNIQUE: Multidetector CT imaging of the chest, abdomen and pelvis was performed following the standard protocol during bolus administration of intravenous contrast. CONTRAST:  134m OMNIPAQUE IOHEXOL 300 MG/ML  SOLN COMPARISON:  06/01/2019 FINDINGS: CT CHEST FINDINGS Cardiovascular: Aortic atherosclerosis. Tortuous thoracic aorta. Juxta diaphragmatic descending thoracic aortic dilatation including at 4.1 cm is similar. Normal heart size with multivessel coronary artery atherosclerosis. No central pulmonary embolism, on this non-dedicated study. Right-sided Port-A-Cath tip at high right atrium. The previously described right internal jugular vein thrombus has resolved. Mediastinum/Nodes: No supraclavicular adenopathy. High right paratracheal 1.2 cm node on 10/02, felt to be similar to minimally enlarged from 1.1 cm on the prior. A pretracheal node of 1.1 cm on 17/2 is enlarged from 8 mm on the prior exam (when remeasured). Low right paratracheal node measures 1.4 cm on 23/2 versus 1.2 cm on the prior exam (when remeasured). Right hilar node of 1.5 cm on 28/2 measures 1.4 cm on the prior exam (when remeasured). Fluid level in the esophagus on 28/2. Lungs/Pleura: No pleural fluid. Moderate centrilobular and paraseptal emphysema. Lower lobe predominant bronchial wall thickening. Soft tissue thickening about the lateral right middle lobe is unchanged, including on 103/6. Minimal left upper lobe subpleural nodularity at 2-3 mm on 63/6, similar. Similar 3 mm right lower lobe pulmonary nodule on 84/6. More inferior left upper lobe 3 mm nodule on 77/6, new. Medial left lower lobe 5 mm pulmonary nodule on 85/6, new. Subpleural left lower lobe 4 mm nodule on 98/6, similar. Anterior left lower lobe subpleural pulmonary nodule of 3 mm on 78/6, new. Musculoskeletal: No acute osseous abnormality. Lower cervical and upper thoracic spondylosis with prior cervical spine fixation. CT ABDOMEN PELVIS FINDINGS Hepatobiliary: Right  hepatic lobe cyst and bilateral too small to characterize liver lesions. Cholecystectomy. Common duct upper normal in similar, including at 1.0 cm. Pancreas: Normal, without mass or ductal dilatation. Spleen: Normal in size, without focal abnormality. Adrenals/Urinary Tract: Normal adrenal glands. Normal kidneys, without hydronephrosis. Normal urinary bladder. Stomach/Bowel: Normal stomach, without wall thickening. Right-sided apparent colonic wall thickening, including on 78/2, is favored to be due to underdistention. Normal small bowel. Vascular/Lymphatic: Advanced aortic and branch vessel atherosclerosis. Infrarenal aortic dilatation including at  4.3 cm on 49/2. 4.2 cm on the prior. No abdominopelvic adenopathy. Reproductive: Hysterectomy. Left adnexal cystic lesion of 6.8 x 6.3 cm on 93/2. Compare 6.6 x 6.2 cm on the prior exam. Other: No significant free fluid. Musculoskeletal: No acute osseous abnormality. Lumbosacral spondylosis. IMPRESSION: 1. Mild enlargement of thoracic nodes, suspicious for progressive nodal metastasis. 2. Bilateral pulmonary nodules, including new primarily subpleural nodules. Cannot exclude progressive pulmonary metastasis. 3. No evidence of metastatic disease in the abdomen or pelvis. 4. Further enlargement of a nonspecific left adnexal cystic lesion. 5. Aortic atherosclerosis (ICD10-I70.0), coronary artery atherosclerosis and emphysema (ICD10-J43.9). 6. Cholecystectomy with similar borderline common duct dilatation. 7. 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 8. Esophageal air fluid level suggests dysmotility or gastroesophageal reflux. 9. Apparent ascending colonic wall thickening, favored to be due to underdistention. Correlate with any abdominal/colonic complaints. Electronically Signed   By: Abigail Miyamoto M.D.   On: 08/16/2019 15:17   CT ANGIO CHEST PE W OR WO CONTRAST  Result Date: 08/24/2019 CLINICAL  DATA:  Shortness of breath for a few days. History of lung cancer EXAM: CT ANGIOGRAPHY CHEST WITH CONTRAST TECHNIQUE: Multidetector CT imaging of the chest was performed using the standard protocol during bolus administration of intravenous contrast. Multiplanar CT image reconstructions and MIPs were obtained to evaluate the vascular anatomy. CONTRAST:  39m OMNIPAQUE IOHEXOL 350 MG/ML SOLN COMPARISON:  08/16/2019 FINDINGS: Cardiovascular: Satisfactory opacification of the pulmonary arteries to the segmental level. No evidence of pulmonary embolism when allowing for areas of motion artifact. Normal heart size. No pericardial effusion. Extensive aortic atherosclerosis. 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 This will be re-evaluated with a close follow-up interval in this patient with active malignancy. Extensive atheromatous plaque involving the aorta, mixed density. Porta catheter in good position. Mediastinum/Nodes: No interval enlargement of lymph nodes which were better evaluated on prior study with parenchymal timing Lungs/Pleura: Centrilobular emphysema and airway thickening with scattered mucoid impaction. Small pulmonary nodules as enumerated on recent staging scan. This study is affected by motion. There is no edema, consolidation, effusion, or pneumothorax. Upper Abdomen: No acute finding. Musculoskeletal: Extensive disc degeneration with mild scoliosis. Review of the MIP images confirms the above findings. IMPRESSION: 1. No evidence of pulmonary embolism. No acute or interval finding when compared to recent staging CT. 2. Motion degraded. Electronically Signed   By: JMonte FantasiaM.D.   On: 08/24/2019 09:59   CT Abdomen Pelvis W Contrast  Result Date: 08/16/2019 CLINICAL DATA:  Squamous cell carcinoma of lung. Chemotherapy and immunotherapy on going. Cough for 1 week. EXAM: CT CHEST, ABDOMEN, AND PELVIS WITH  CONTRAST TECHNIQUE: Multidetector CT imaging of the chest, abdomen and pelvis was performed following the standard protocol during bolus administration of intravenous contrast. CONTRAST:  1037mOMNIPAQUE IOHEXOL 300 MG/ML  SOLN COMPARISON:  06/01/2019 FINDINGS: CT CHEST FINDINGS Cardiovascular: Aortic atherosclerosis. Tortuous thoracic aorta. Juxta diaphragmatic descending thoracic aortic dilatation including at 4.1 cm is similar. Normal heart size with multivessel coronary artery atherosclerosis. No central pulmonary embolism, on this non-dedicated study. Right-sided Port-A-Cath tip at high right atrium. The previously described right internal jugular vein thrombus has resolved. Mediastinum/Nodes: No supraclavicular adenopathy. High right paratracheal 1.2 cm node on 10/02, felt to be similar to minimally enlarged from 1.1 cm on the prior. A pretracheal node of 1.1 cm on 17/2 is enlarged from 8 mm on the prior exam (when  remeasured). Low right paratracheal node measures 1.4 cm on 23/2 versus 1.2 cm on the prior exam (when remeasured). Right hilar node of 1.5 cm on 28/2 measures 1.4 cm on the prior exam (when remeasured). Fluid level in the esophagus on 28/2. Lungs/Pleura: No pleural fluid. Moderate centrilobular and paraseptal emphysema. Lower lobe predominant bronchial wall thickening. Soft tissue thickening about the lateral right middle lobe is unchanged, including on 103/6. Minimal left upper lobe subpleural nodularity at 2-3 mm on 63/6, similar. Similar 3 mm right lower lobe pulmonary nodule on 84/6. More inferior left upper lobe 3 mm nodule on 77/6, new. Medial left lower lobe 5 mm pulmonary nodule on 85/6, new. Subpleural left lower lobe 4 mm nodule on 98/6, similar. Anterior left lower lobe subpleural pulmonary nodule of 3 mm on 78/6, new. Musculoskeletal: No acute osseous abnormality. Lower cervical and upper thoracic spondylosis with prior cervical spine fixation. CT ABDOMEN PELVIS FINDINGS Hepatobiliary:  Right hepatic lobe cyst and bilateral too small to characterize liver lesions. Cholecystectomy. Common duct upper normal in similar, including at 1.0 cm. Pancreas: Normal, without mass or ductal dilatation. Spleen: Normal in size, without focal abnormality. Adrenals/Urinary Tract: Normal adrenal glands. Normal kidneys, without hydronephrosis. Normal urinary bladder. Stomach/Bowel: Normal stomach, without wall thickening. Right-sided apparent colonic wall thickening, including on 78/2, is favored to be due to underdistention. Normal small bowel. Vascular/Lymphatic: Advanced aortic and branch vessel atherosclerosis. Infrarenal aortic dilatation including at 4.3 cm on 49/2. 4.2 cm on the prior. No abdominopelvic adenopathy. Reproductive: Hysterectomy. Left adnexal cystic lesion of 6.8 x 6.3 cm on 93/2. Compare 6.6 x 6.2 cm on the prior exam. Other: No significant free fluid. Musculoskeletal: No acute osseous abnormality. Lumbosacral spondylosis. IMPRESSION: 1. Mild enlargement of thoracic nodes, suspicious for progressive nodal metastasis. 2. Bilateral pulmonary nodules, including new primarily subpleural nodules. Cannot exclude progressive pulmonary metastasis. 3. No evidence of metastatic disease in the abdomen or pelvis. 4. Further enlargement of a nonspecific left adnexal cystic lesion. 5. Aortic atherosclerosis (ICD10-I70.0), coronary artery atherosclerosis and emphysema (ICD10-J43.9). 6. Cholecystectomy with similar borderline common duct dilatation. 7. 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 8. Esophageal air fluid level suggests dysmotility or gastroesophageal reflux. 9. Apparent ascending colonic wall thickening, favored to be due to underdistention. Correlate with any abdominal/colonic complaints. Electronically Signed   By: Abigail Miyamoto M.D.   On: 08/16/2019 15:17    SIGNED: [name], MD, FACP, FHM. [facility],  Pager (please use [email or web address] to page/text)  If 7PM-7AM, please contact night-coverage [email or web address] [facility] 08/25/2019, 11:23 AM

## 2019-08-26 MED ORDER — AZITHROMYCIN 250 MG PO TABS: ORAL_TABLET | ORAL | 0 refills | Status: DC

## 2019-08-26 MED ORDER — GUAIFENESIN-DM 100-10 MG/5ML PO SYRP: 5.0000 mL | ORAL_SOLUTION | ORAL | 0 refills | Status: DC | PRN

## 2019-08-26 MED ORDER — HEPARIN SOD (PORK) LOCK FLUSH 100 UNIT/ML IV SOLN
500.0000 [IU] | INTRAVENOUS | Status: AC | PRN
  Administered 2019-08-26: 500 [IU]
  Filled 2019-08-26: qty 5

## 2019-08-26 MED ORDER — METHYLPREDNISOLONE 4 MG PO TABS
4.0000 mg | ORAL_TABLET | Freq: Every day | ORAL | 0 refills | Status: DC
Start: 2019-08-26 — End: 2019-10-17

## 2019-08-26 NOTE — Discharge Summary (Addendum)
Physician Discharge Summary [facility]    Patient: Debra Rodgers                   Admit date: 08/23/2019   DOB: Jan 31, 1946             Discharge date:08/26/2019/7:39 AM BTD:[id number]                          PCP: [name], MD  Disposition: HOME  Recommendations for Outpatient Follow-up:   . Follow up: in 1 week  Discharge Condition: Stable   Code Status:   Code Status: Full Code  Diet recommendation: Regular healthy diet   Discharge Diagnoses:    Principal Problem:   COPD exacerbation ([place]) Active Problems:   Hyperlipidemia   Essential hypertension   OSA (obstructive sleep apnea)   Abdominal aortic aneurysm (AAA) 35 to 39 mm in diameter ([place])   Stage IV squamous cell carcinoma of right lung (H[facility])   Acute exacerbation of chronic obstructive pulmonary disease (COPD) ([place])   History of Present Illness/ Hospital Course Debra Rodgers Summary:   Debra Rodgers a73 y.o.femalewith a history of COPD, stage IV lung cancer, OSA, and HTN who presented to the ED with 3 days of shortness of breath, nonproductive cough and wheezing on arrival. CXR showed stable left base atelectasis, no new infiltrate, WBC 5.3k, BNP 16.6, SARS-CoV-2 negative, no ST deviations on ECG. Breathing treatments and steroids given, though hypoxia and wheezing persisted prompting admission. D-dimer 2.14, though subsequent CTA chest showed no PE.  Acute hypoxemic respiratory failure due to COPD exacerbation complicated by lung cancer: -Acute respiratory failure was due to COPD exacerbation patient was in acute respiratory distress On admission-subsequently responded well to treatment --was quickly tapered off O2 supplement. -  Possible precipitant is allergies vs. viral illness vs. atypical pneumonia -Patient has been weaned off oxygen, with minimal exertion becomes shortness of breath, coughing spells -SARS-CoV-2 negative - Continue steroids,  steroids -switched to p.o. steroids, -  Start azithromycin 6/1  to be continued for total of  7 days - Continue bronchodilators. Covid negative, can give nebs. Schedule duonebs QID, continue prn albuterol.  Stage IV squamous lung CA: -Oncology consulted recommended to follow-up as an outpatient no immediate recommendation  -Dx Sept 2020. Pt hascompleted chemotherapy with carboplatin and paclitaxel and is now on immunotherapy with pembrolizumab every 3 weeks. - Continue regular follow up with Dr. Julien Nordmann. Made oncology aware of unrelated admission. CTA chest also revealed stable, not progressed disease.  HTN:  - Continue home norvasc, lisinopril, chlorthalidone. Not on beta blocker.   HLD:  - Continue statin  History of port-associated IJ thrombus: Incidentally noticed on staging CT previously, no longer taking xarelto per pt, it is unclear whether this was to be continued longer, though no DVT was noted on upper extremity U/S ordered by PCP in April. No clot noted on CTA chest today. Will continue with prophylactic lovenox while admitted.  Anemia of chronic disease/malignancy:  - Monitor intermittently. At baseline.  Depression:  - Cotninue SSRI    Antimicrobials: -Augmentin/azithromycin 6/1 - total of 7 days    Consultants: Oncology   ------------------------------------------------------------------------------------------------------------------------------  Code Status:   Code Status: Full Code Family Communication: No family member present at bedside- The above findings and plan of care has been discussed with patient in detail,  they expressed understanding and agreement of above. -Advance care planning has been discussed.   Admission status:  Status is: Inpatient  Dispo: The patient is from: Home  Anticipated d/c is to: Home     Nutritional status:          Discharge Instructions:   Discharge Instructions    Activity as tolerated - No restrictions    Complete by: As directed    Diet - low sodium heart healthy   Complete by: As directed    Discharge instructions   Complete by: As directed    F/uo with your Oncologist in 1 week   Increase activity slowly   Complete by: As directed        Medication List    STOP taking these medications   Delsym 30 MG/5ML liquid Generic drug: dextromethorphan   nicotine 14 mg/24hr patch Commonly known as: NICODERM CQ - dosed in mg/24 hours   prochlorperazine 10 MG tablet Commonly known as: COMPAZINE     TAKE these medications   acetaminophen 325 MG tablet Commonly known as: Tylenol Take 2 tablets (650 mg total) by mouth every 6 (six) hours as needed.   albuterol 108 (90 Base) MCG/ACT inhaler Commonly known as: VENTOLIN HFA Inhale 2 puffs into the lungs every 6 (six) hours as needed for wheezing or shortness of breath.   amLODipine 10 MG tablet Commonly known as: NORVASC Take 1 tablet (10 mg total) by mouth daily.   atorvastatin 20 MG tablet Commonly known as: LIPITOR Take 1 tablet (20 mg total) by mouth daily.   azithromycin 250 MG tablet Commonly known as: ZITHROMAX ONE PO daily- for 5 more days   chlorthalidone 50 MG tablet Commonly known as: HYGROTON Take 1 tablet (50 mg total) by mouth daily.   famotidine 20 MG tablet Commonly known as: PEPCID Take 20 mg by mouth daily.   FLUoxetine 20 MG capsule Commonly known as: PROZAC Take 1 capsule (20 mg total) by mouth daily.   guaiFENesin-dextromethorphan 100-10 MG/5ML syrup Commonly known as: ROBITUSSIN DM Take 5 mLs by mouth every 4 (four) hours as needed for cough.   HYDROcodone-acetaminophen 7.5-325 MG tablet Commonly known as: Norco Take 1 tablet by mouth every 6 (six) hours as needed for moderate pain.   lidocaine-prilocaine cream Commonly known as: EMLA Apply 1 application topically as needed. What changed: reasons to take this   lisinopril 10 MG tablet Commonly known as: ZESTRIL Take 1 tablet (10 mg total)  by mouth daily.   methylPREDNISolone 4 MG tablet Commonly known as: Medrol Take 1 tablet (4 mg total) by mouth daily.   Potassium Chloride ER 20 MEQ Tbcr Take 20 mEq by mouth daily.   Rivaroxaban Stater Pack (15 mg and 20 mg) Commonly known as: XARELTO STARTER PACK Follow package directions: Take one 62m tablet by mouth twice a day. On day 22, switch to one 236mtablet once a day. Take with food.   Stiolto Respimat 2.5-2.5 MCG/ACT Aers Generic drug: Tiotropium Bromide-Olodaterol Inhale 2 puffs into the lungs daily.       Allergies  Allergen Reactions  . Codeine Sulfate     REACTION: "makes me high"  . Pantoprazole Sodium Swelling and Rash    facial swelling     Procedures /Studies:   DG Chest 2 View  Result Date: 08/23/2019 CLINICAL DATA:  [age]ear old female with cough and shortness of breath four days. History of lung cancer, on chemotherapy. EXAM: CHEST - 2 VIEW COMPARISON:  08/16/2019 chest CT and prior studies FINDINGS: The cardiomediastinal silhouette is unremarkable. A RIGHT IJ Port-A-Cath is again noted. LEFT basilar atelectasis again noted. There  is no evidence of focal airspace disease, pulmonary edema, pulmonary mass, pleural effusion, or pneumothorax. The tiny nodules identified on recent CT are difficult to visualize radiographically. No acute bony abnormalities are identified. IMPRESSION: Unchanged LEFT basilar atelectasis. No new abnormalities identified. Electronically Signed   By: Margarette Canada M.D.   On: 08/23/2019 16:54   CT Chest W Contrast  Result Date: 08/16/2019 CLINICAL DATA:  Squamous cell carcinoma of lung. Chemotherapy and immunotherapy on going. Cough for 1 week. EXAM: CT CHEST, ABDOMEN, AND PELVIS WITH CONTRAST TECHNIQUE: Multidetector CT imaging of the chest, abdomen and pelvis was performed following the standard protocol during bolus administration of intravenous contrast. CONTRAST:  145m OMNIPAQUE IOHEXOL 300 MG/ML  SOLN COMPARISON:  06/01/2019  FINDINGS: CT CHEST FINDINGS Cardiovascular: Aortic atherosclerosis. Tortuous thoracic aorta. Juxta diaphragmatic descending thoracic aortic dilatation including at 4.1 cm is similar. Normal heart size with multivessel coronary artery atherosclerosis. No central pulmonary embolism, on this non-dedicated study. Right-sided Port-A-Cath tip at high right atrium. The previously described right internal jugular vein thrombus has resolved. Mediastinum/Nodes: No supraclavicular adenopathy. High right paratracheal 1.2 cm node on 10/02, felt to be similar to minimally enlarged from 1.1 cm on the prior. A pretracheal node of 1.1 cm on 17/2 is enlarged from 8 mm on the prior exam (when remeasured). Low right paratracheal node measures 1.4 cm on 23/2 versus 1.2 cm on the prior exam (when remeasured). Right hilar node of 1.5 cm on 28/2 measures 1.4 cm on the prior exam (when remeasured). Fluid level in the esophagus on 28/2. Lungs/Pleura: No pleural fluid. Moderate centrilobular and paraseptal emphysema. Lower lobe predominant bronchial wall thickening. Soft tissue thickening about the lateral right middle lobe is unchanged, including on 103/6. Minimal left upper lobe subpleural nodularity at 2-3 mm on 63/6, similar. Similar 3 mm right lower lobe pulmonary nodule on 84/6. More inferior left upper lobe 3 mm nodule on 77/6, new. Medial left lower lobe 5 mm pulmonary nodule on 85/6, new. Subpleural left lower lobe 4 mm nodule on 98/6, similar. Anterior left lower lobe subpleural pulmonary nodule of 3 mm on 78/6, new. Musculoskeletal: No acute osseous abnormality. Lower cervical and upper thoracic spondylosis with prior cervical spine fixation. CT ABDOMEN PELVIS FINDINGS Hepatobiliary: Right hepatic lobe cyst and bilateral too small to characterize liver lesions. Cholecystectomy. Common duct upper normal in similar, including at 1.0 cm. Pancreas: Normal, without mass or ductal dilatation. Spleen: Normal in size, without focal  abnormality. Adrenals/Urinary Tract: Normal adrenal glands. Normal kidneys, without hydronephrosis. Normal urinary bladder. Stomach/Bowel: Normal stomach, without wall thickening. Right-sided apparent colonic wall thickening, including on 78/2, is favored to be due to underdistention. Normal small bowel. Vascular/Lymphatic: Advanced aortic and branch vessel atherosclerosis. Infrarenal aortic dilatation including at 4.3 cm on 49/2. 4.2 cm on the prior. No abdominopelvic adenopathy. Reproductive: Hysterectomy. Left adnexal cystic lesion of 6.8 x 6.3 cm on 93/2. Compare 6.6 x 6.2 cm on the prior exam. Other: No significant free fluid. Musculoskeletal: No acute osseous abnormality. Lumbosacral spondylosis. IMPRESSION: 1. Mild enlargement of thoracic nodes, suspicious for progressive nodal metastasis. 2. Bilateral pulmonary nodules, including new primarily subpleural nodules. Cannot exclude progressive pulmonary metastasis. 3. No evidence of metastatic disease in the abdomen or pelvis. 4. Further enlargement of a nonspecific left adnexal cystic lesion. 5. Aortic atherosclerosis (ICD10-I70.0), coronary artery atherosclerosis and emphysema (ICD10-J43.9). 6. Cholecystectomy with similar borderline common duct dilatation. 7. 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 8. Esophageal air fluid level  suggests dysmotility or gastroesophageal reflux. 9. Apparent ascending colonic wall thickening, favored to be due to underdistention. Correlate with any abdominal/colonic complaints. Electronically Signed   By: Abigail Miyamoto M.D.   On: 08/16/2019 15:17   CT ANGIO CHEST PE W OR WO CONTRAST  Result Date: 08/24/2019 CLINICAL DATA:  Shortness of breath for a few days. History of lung cancer EXAM: CT ANGIOGRAPHY CHEST WITH CONTRAST TECHNIQUE: Multidetector CT imaging of the chest was performed using the standard protocol during bolus administration of  intravenous contrast. Multiplanar CT image reconstructions and MIPs were obtained to evaluate the vascular anatomy. CONTRAST:  35m OMNIPAQUE IOHEXOL 350 MG/ML SOLN COMPARISON:  08/16/2019 FINDINGS: Cardiovascular: Satisfactory opacification of the pulmonary arteries to the segmental level. No evidence of pulmonary embolism when allowing for areas of motion artifact. Normal heart size. No pericardial effusion. Extensive aortic atherosclerosis. 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 This will be re-evaluated with a close follow-up interval in this patient with active malignancy. Extensive atheromatous plaque involving the aorta, mixed density. Porta catheter in good position. Mediastinum/Nodes: No interval enlargement of lymph nodes which were better evaluated on prior study with parenchymal timing Lungs/Pleura: Centrilobular emphysema and airway thickening with scattered mucoid impaction. Small pulmonary nodules as enumerated on recent staging scan. This study is affected by motion. There is no edema, consolidation, effusion, or pneumothorax. Upper Abdomen: No acute finding. Musculoskeletal: Extensive disc degeneration with mild scoliosis. Review of the MIP images confirms the above findings. IMPRESSION: 1. No evidence of pulmonary embolism. No acute or interval finding when compared to recent staging CT. 2. Motion degraded. Electronically Signed   By: JMonte FantasiaM.D.   On: 08/24/2019 09:59   CT Abdomen Pelvis W Contrast  Result Date: 08/16/2019 CLINICAL DATA:  Squamous cell carcinoma of lung. Chemotherapy and immunotherapy on going. Cough for 1 week. EXAM: CT CHEST, ABDOMEN, AND PELVIS WITH CONTRAST TECHNIQUE: Multidetector CT imaging of the chest, abdomen and pelvis was performed following the standard protocol during bolus administration of intravenous contrast. CONTRAST:  1054mOMNIPAQUE IOHEXOL 300 MG/ML  SOLN  COMPARISON:  06/01/2019 FINDINGS: CT CHEST FINDINGS Cardiovascular: Aortic atherosclerosis. Tortuous thoracic aorta. Juxta diaphragmatic descending thoracic aortic dilatation including at 4.1 cm is similar. Normal heart size with multivessel coronary artery atherosclerosis. No central pulmonary embolism, on this non-dedicated study. Right-sided Port-A-Cath tip at high right atrium. The previously described right internal jugular vein thrombus has resolved. Mediastinum/Nodes: No supraclavicular adenopathy. High right paratracheal 1.2 cm node on 10/02, felt to be similar to minimally enlarged from 1.1 cm on the prior. A pretracheal node of 1.1 cm on 17/2 is enlarged from 8 mm on the prior exam (when remeasured). Low right paratracheal node measures 1.4 cm on 23/2 versus 1.2 cm on the prior exam (when remeasured). Right hilar node of 1.5 cm on 28/2 measures 1.4 cm on the prior exam (when remeasured). Fluid level in the esophagus on 28/2. Lungs/Pleura: No pleural fluid. Moderate centrilobular and paraseptal emphysema. Lower lobe predominant bronchial wall thickening. Soft tissue thickening about the lateral right middle lobe is unchanged, including on 103/6. Minimal left upper lobe subpleural nodularity at 2-3 mm on 63/6, similar. Similar 3 mm right lower lobe pulmonary nodule on 84/6. More inferior left upper lobe 3 mm nodule on 77/6, new. Medial left lower lobe 5 mm pulmonary nodule on 85/6, new. Subpleural left lower lobe 4 mm nodule on 98/6, similar. Anterior left lower lobe  subpleural pulmonary nodule of 3 mm on 78/6, new. Musculoskeletal: No acute osseous abnormality. Lower cervical and upper thoracic spondylosis with prior cervical spine fixation. CT ABDOMEN PELVIS FINDINGS Hepatobiliary: Right hepatic lobe cyst and bilateral too small to characterize liver lesions. Cholecystectomy. Common duct upper normal in similar, including at 1.0 cm. Pancreas: Normal, without mass or ductal dilatation. Spleen: Normal in  size, without focal abnormality. Adrenals/Urinary Tract: Normal adrenal glands. Normal kidneys, without hydronephrosis. Normal urinary bladder. Stomach/Bowel: Normal stomach, without wall thickening. Right-sided apparent colonic wall thickening, including on 78/2, is favored to be due to underdistention. Normal small bowel. Vascular/Lymphatic: Advanced aortic and branch vessel atherosclerosis. Infrarenal aortic dilatation including at 4.3 cm on 49/2. 4.2 cm on the prior. No abdominopelvic adenopathy. Reproductive: Hysterectomy. Left adnexal cystic lesion of 6.8 x 6.3 cm on 93/2. Compare 6.6 x 6.2 cm on the prior exam. Other: No significant free fluid. Musculoskeletal: No acute osseous abnormality. Lumbosacral spondylosis. IMPRESSION: 1. Mild enlargement of thoracic nodes, suspicious for progressive nodal metastasis. 2. Bilateral pulmonary nodules, including new primarily subpleural nodules. Cannot exclude progressive pulmonary metastasis. 3. No evidence of metastatic disease in the abdomen or pelvis. 4. Further enlargement of a nonspecific left adnexal cystic lesion. 5. Aortic atherosclerosis (ICD10-I70.0), coronary artery atherosclerosis and emphysema (ICD10-J43.9). 6. Cholecystectomy with similar borderline common duct dilatation. 7. 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 8. Esophageal air fluid level suggests dysmotility or gastroesophageal reflux. 9. Apparent ascending colonic wall thickening, favored to be due to underdistention. Correlate with any abdominal/colonic complaints. Electronically Signed   By: Abigail Miyamoto M.D.   On: 08/16/2019 15:17     Subjective:   Patient was seen and examined 08/26/2019, 7:39 AM Patient stable today. No acute distress.  No issues overnight Stable for discharge.  Discharge Exam:    Vitals:   08/25/19 1344 08/25/19 1347 08/25/19 2024 08/25/19 2226  BP: (!) 144/79   113/71  Pulse: 95   84   Resp: (!) 24   20  Temp: 98.5 F (36.9 C)   98.3 F (36.8 C)  TempSrc: Oral   Oral  SpO2: 92% 93% 93% 94%  Weight:      Height:        General: Pt lying comfortably in bed & appears in no obvious distress. Cardiovascular: S1 & S2 heard, RRR, S1/S2 +. No murmurs, rubs, gallops or clicks. No JVD or pedal edema. Respiratory: Clear to auscultation without wheezing, rhonchi or crackles. No increased work of breathing. Abdominal:  Non-distended, non-tender & soft. No organomegaly or masses appreciated. Normal bowel sounds heard. CNS: Alert and oriented. No focal deficits. Extremities: no edema, no cyanosis    The results of significant diagnostics from this hospitalization (including imaging, microbiology, ancillary and laboratory) are listed below for reference.      Microbiology:   Recent Results (from the past 240 hour(s))  SARS Coronavirus 2 (TAT 6-24 hrs)     Status: None   Collection Time: 08/18/19 11:50 AM   Specimen: Nasopharyngeal Swab  Result Value Ref Range Status   SARS Coronavirus 2 NEGATIVE NEGATIVE Final    Comment: (NOTE) SARS-CoV-2 target nucleic acids are NOT DETECTED. The SARS-CoV-2 RNA is generally detectable in upper and lower respiratory specimens during the acute phase of infection. Negative results do not preclude SARS-CoV-2 infection, do not rule out co-infections with other pathogens, and should not be used as the sole basis for treatment or other patient management decisions. Negative results must be  combined with clinical observations, patient history, and epidemiological information. The expected result is Negative. Fact Sheet for Patients: SugarRoll.be Fact Sheet for Healthcare Providers: https://www.woods-mathews.com/ This test is not yet approved or cleared by the [place] FDA and  has been authorized for detection and/or diagnosis of SARS-CoV-2 by FDA under an Emergency Use Authorization (EUA).  This EUA will remain  in effect (meaning this test can be used) for the duration of the COVID-19 declaration under Section 56 4(b)(1) of the Act, 21 U.S.C. section 360bbb-3(b)(1), unless the authorization is terminated or revoked sooner. Performed at [facility], [place] [address] [place], [place] [address]   SARS Coronavirus 2 by RT PCR (hospital order, performed in [facility] hospital lab) Nasopharyngeal Nasopharyngeal Swab     Status: None   Collection Time: 08/23/19  5:15 PM   Specimen: Nasopharyngeal Swab  Result Value Ref Range Status   SARS Coronavirus 2 NEGATIVE NEGATIVE Final    Comment: (NOTE) SARS-CoV-2 target nucleic acids are NOT DETECTED. The SARS-CoV-2 RNA is generally detectable in upper and lower respiratory specimens during the acute phase of infection. The lowest concentration of SARS-CoV-2 viral copies this assay can detect is 250 copies / mL. A negative result does not preclude SARS-CoV-2 infection and should not be used as the sole basis for treatment or other patient management decisions.  A negative result may occur with improper specimen collection / handling, submission of specimen other than nasopharyngeal swab, presence of viral mutation(s) within the areas targeted by this assay, and inadequate number of viral copies (<250 copies / mL). A negative result must be combined with clinical observations, patient history, and epidemiological information. Fact Sheet for Patients:   StrictlyIdeas.no Fact Sheet for Healthcare Providers: BankingDealers.co.za This test is not yet approved or cleared  by the [place] FDA and has been authorized for detection and/or diagnosis of SARS-CoV-2 by FDA under an Emergency Use Authorization (EUA).  This EUA will remain in effect (meaning this test can be used) for the duration of the COVID-19 declaration under Section 564(b)(1) of the Act, 21 U.S.C. section  360bbb-3(b)(1), unless the authorization is terminated or revoked sooner. Performed at [facility], [place] [address] [place], [place] [address]      Labs:   CBC: Recent Labs  Lab 08/23/19 1715 08/24/19 0400  WBC 5.3 6.1  NEUTROABS 2.2  --   HGB 12.0 11.6*  HCT 36.3 35.6*  MCV 98.9 99.2  PLT 188 169   Basic Metabolic Panel: Recent Labs  Lab 08/23/19 1715 08/24/19 0400  NA 137 137  K 3.6 3.6  CL 97* 98  CO2 29 29  GLUCOSE 100* 149*  BUN 23 21  CREATININE 0.99 1.01*  CALCIUM 9.2 9.2   Liver Function Tests: No results for input(s): AST, ALT, ALKPHOS, BILITOT, PROT, ALBUMIN in the last 168 hours. BNP (last 3 results) Recent Labs    08/23/19 1715  BNP 16.6   Cardiac Enzymes:Urinalysis    Component Value Date/Time   COLORURINE YELLOW 11/04/2018 0011   APPEARANCEUR CLEAR 11/04/2018 0011   LABSPEC 1.017 11/04/2018 0011   PHURINE 5.0 11/04/2018 0011   GLUCOSEU NEGATIVE 11/04/2018 0011   HGBUR NEGATIVE 11/04/2018 0011   BILIRUBINUR NEGATIVE 11/04/2018 0011   BILIRUBINUR NEGATIVE 09/22/2018 1642   KETONESUR NEGATIVE 11/04/2018 0011   PROTEINUR NEGATIVE 11/04/2018 0011   UROBILINOGEN 2.0 (A) 09/22/2018 1642   UROBILINOGEN 0.2 07/08/2013 1642   NITRITE NEGATIVE 11/04/2018 0011   LEUKOCYTESUR NEGATIVE 11/04/2018 0011  Time coordinating discharge: Over 45 minutes  SIGNED: [name], MD, FACP, [facility]. [facility],  Please use [email or web address] to Page If 7PM-7AM, please contact night-coverage [email or web address] 08/26/2019, 7:39 AM

## 2019-08-26 NOTE — Plan of Care (Signed)
  Problem: Education: Goal: Knowledge of General Education information will improve Description Including pain rating scale, medication(s)/side effects and non-pharmacologic comfort measures Outcome: Progressing   Problem: Health Behavior/Discharge Planning: Goal: Ability to manage health-related needs will improve Outcome: Progressing   

## 2019-08-26 NOTE — Progress Notes (Signed)
Pt transported out with Husband in stable condition. Srp, RN

## 2019-08-26 NOTE — Progress Notes (Signed)
Discharge reviewed and discussed with pt, pt acknowledged understanding of instructions. SRP, RN

## 2019-08-26 NOTE — Progress Notes (Signed)
PT demonstrated hands on understanding of Flutter device. 

## 2019-08-26 NOTE — Progress Notes (Signed)
IS reviewed with pt and returned demonstration complete. SRP, RN

## 2019-08-31 ENCOUNTER — Other Ambulatory Visit: Payer: Self-pay

## 2019-08-31 ENCOUNTER — Ambulatory Visit: Payer: Medicare HMO | Admitting: Emergency Medicine

## 2019-08-31 ENCOUNTER — Encounter: Payer: Self-pay | Admitting: Emergency Medicine

## 2019-08-31 DIAGNOSIS — C3491 Malignant neoplasm of unspecified part of right bronchus or lung: Secondary | ICD-10-CM

## 2019-08-31 DIAGNOSIS — J439 Emphysema, unspecified: Secondary | ICD-10-CM

## 2019-08-31 DIAGNOSIS — G4733 Obstructive sleep apnea (adult) (pediatric): Secondary | ICD-10-CM

## 2019-08-31 NOTE — Progress Notes (Signed)
Subjective:    Patient ID: Debra Rodgers, female    DOB: [birth year], 74 y.o.   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  She is followed by Dr. Julien Nordmann for stage IV disease, is on maintenance Keytruda. Current bronchodilator regimen includes Stiolto which we started last visit, albuterol which she uses approximately. She believes that the stiolto has been helpful, cough and breathing better. For the last few weeks she has had more cough, allergic rhinitis. She is on zestril. No GERD sx. She does not have an albuterol HFA.  She is reliable with her CPAP, wears it every night. Her machine is old - [employer] provides her equipment. She needs new one.  She notes that she is reliable with her CPAP, wears it consistently for greater than 4 hours each night.  She notes good clinical benefit with increased daytime energy, decreased daytime sleepiness.  ROV 08/31/19 --74 year old woman with COPD and right middle lobe squamous cell lung cancer stage IV on Keytruda with Dr. Julien Nordmann, OSA on CPAP.  She reports that that she was just admitted 5/31-6/3 due to progressive dyspnea, nonproductive cough and wheezing.  COVID-19 negative.  She was treated for an acute exacerbation of COPD with corticosteroids, bronchodilators, azithromycin.   She reports that her cough and dyspnea are both much improved. Her cough was productive, clear phlegm. No viral prodrome. She was having some sneezing and allergy sx - atypical for her.  Completing steroids and azithro.  She is currently managed on Stiolto. She is using albuterol less now - a few times a week. She has follow up with Oncology on 6/16  CT scan of the chest done on 08/24/2019 reviewed by me, shows no pulmonary embolism, stable findings compared with her most recent staging CT below, centrilobular emphysema small bilateral pulmonary nodules (unchanged)  CT chest  08/16/19 reviewed, shows some mild increase in size mediastinal and paratracheal nodes, scattered B nodules with some new subpleural nodules.   MDM:  Reviewed CT chest 08/16/2019, 08/24/2019 Reviewed hospital notes from 5/31 through 6/3   Review of Systems  Constitutional: Negative for fever and unexpected weight change.  HENT: Negative for congestion, dental problem, ear pain, nosebleeds, postnasal drip, rhinorrhea, sinus pressure, sneezing, sore throat and trouble swallowing.   Eyes: Negative for redness and itching.  Respiratory: Positive for shortness of breath. Negative for cough, chest tightness and wheezing.   Cardiovascular: Negative for chest pain, palpitations and leg swelling.  Gastrointestinal: Negative for nausea and vomiting.  Genitourinary: Negative for dysuria.  Musculoskeletal: Negative for joint swelling.  Skin: Negative for rash.  Neurological: Negative for headaches.  Hematological: Does not bruise/bleed easily.  Psychiatric/Behavioral: Negative for dysphoric mood. The patient is not nervous/anxious.        Objective:   Physical Exam  Vitals:   08/31/19 1332  BP: 120/62  Pulse: 88  Temp: 98.7 F (37.1 C)  TempSrc: Oral  SpO2: 97%  Weight: 136 lb 6.4 oz (61.9 kg)  Height: 5' 1.5" (1.562 m)   Gen: Pleasant, well-nourished, in no distress,  normal affect  ENT: No lesions,  mouth clear,  oropharynx clear, no postnasal drip  Neck: No JVD, no stridor  Lungs: No use of accessory muscles, B scattered rhonchi, no wheeze or crackles  Cardiovascular: RRR, heart sounds normal, no murmur or gallops, no peripheral edema  Musculoskeletal: No deformities, no cyanosis or  clubbing;  Neuro: alert, awake, non focal  Skin: Warm, no lesions or rash     Assessment & Plan:  COPD (chronic obstructive pulmonary disease) ([place]) With recent exacerbation.  She is now clinically improved after steroids and azithromycin.  She will finish this regimen.  Okay to continue to  manage her on Stiolto.  If she shows evidence for frequent exacerbations then we will consider changing to an alternative that will include an ICS.  COVID-19 vaccine is up-to-date  OSA (obstructive sleep apnea) Good compliance.  Report reviewed and she used for 57% of the nights.  Take into account that she was hospitalized for 4 of those nights and could not use the machine then.  Good clinical benefit.  Stage IV squamous cell carcinoma of right lung (HCC) Some subtle changes noted on her most recent CT scan 08/16/2019.  She has follow-up with Dr. Julien Nordmann this month to discuss whether this merits reinitiation of therapy or whether we will continue to follow.   Baltazar Apo, MD, PhD 08/31/2019, 1:48 PM [facility] [phone] or if no answer [phone]

## 2019-08-31 NOTE — Assessment & Plan Note (Signed)
Some subtle changes noted on her most recent CT scan 08/16/2019.  She has follow-up with Dr. Julien Nordmann this month to discuss whether this merits reinitiation of therapy or whether we will continue to follow.

## 2019-08-31 NOTE — Patient Instructions (Signed)
Please finish your steroids and azithromycin as prescribed at your recent hospitalization.  Continue Stiolto 2 puffs once a day Keep albuterol available to use 2 puffs up to every 4 hours if needed for shortness of breath, chest tightness, wheezing.  Follow with Dr Julien Nordmann this month as planned.

## 2019-08-31 NOTE — Assessment & Plan Note (Signed)
With recent exacerbation.  She is now clinically improved after steroids and azithromycin.  She will finish this regimen.  Okay to continue to manage her on Stiolto.  If she shows evidence for frequent exacerbations then we will consider changing to an alternative that will include an ICS.  COVID-19 vaccine is up-to-date

## 2019-08-31 NOTE — Assessment & Plan Note (Signed)
Good compliance.  Report reviewed and she used for 57% of the nights.  Take into account that she was hospitalized for 4 of those nights and could not use the machine then.  Good clinical benefit.

## 2019-09-08 ENCOUNTER — Inpatient Hospital Stay: Payer: Medicare HMO

## 2019-09-08 ENCOUNTER — Other Ambulatory Visit: Payer: Self-pay | Admitting: Medical Oncology

## 2019-09-08 ENCOUNTER — Encounter: Payer: Self-pay | Admitting: Internal Medicine

## 2019-09-08 ENCOUNTER — Inpatient Hospital Stay: Payer: Medicare HMO | Attending: Physician Assistant | Admitting: Internal Medicine

## 2019-09-08 ENCOUNTER — Other Ambulatory Visit: Payer: Self-pay

## 2019-09-08 VITALS — BP 112/77 | HR 67 | Temp 97.9°F | Resp 17 | Ht 61.5 in | Wt 138.7 lb

## 2019-09-08 DIAGNOSIS — G4730 Sleep apnea, unspecified: Secondary | ICD-10-CM | POA: Diagnosis not present

## 2019-09-08 DIAGNOSIS — C3411 Malignant neoplasm of upper lobe, right bronchus or lung: Secondary | ICD-10-CM | POA: Diagnosis not present

## 2019-09-08 DIAGNOSIS — J449 Chronic obstructive pulmonary disease, unspecified: Secondary | ICD-10-CM | POA: Insufficient documentation

## 2019-09-08 DIAGNOSIS — Z86718 Personal history of other venous thrombosis and embolism: Secondary | ICD-10-CM | POA: Diagnosis not present

## 2019-09-08 DIAGNOSIS — F17210 Nicotine dependence, cigarettes, uncomplicated: Secondary | ICD-10-CM | POA: Insufficient documentation

## 2019-09-08 DIAGNOSIS — F329 Major depressive disorder, single episode, unspecified: Secondary | ICD-10-CM | POA: Diagnosis not present

## 2019-09-08 DIAGNOSIS — I10 Essential (primary) hypertension: Secondary | ICD-10-CM | POA: Diagnosis not present

## 2019-09-08 DIAGNOSIS — C782 Secondary malignant neoplasm of pleura: Secondary | ICD-10-CM | POA: Diagnosis not present

## 2019-09-08 DIAGNOSIS — Z5111 Encounter for antineoplastic chemotherapy: Secondary | ICD-10-CM | POA: Insufficient documentation

## 2019-09-08 DIAGNOSIS — Z5112 Encounter for antineoplastic immunotherapy: Secondary | ICD-10-CM

## 2019-09-08 DIAGNOSIS — I700 Atherosclerosis of aorta: Secondary | ICD-10-CM | POA: Diagnosis not present

## 2019-09-08 DIAGNOSIS — Z79899 Other long term (current) drug therapy: Secondary | ICD-10-CM | POA: Insufficient documentation

## 2019-09-08 DIAGNOSIS — Z7901 Long term (current) use of anticoagulants: Secondary | ICD-10-CM | POA: Diagnosis not present

## 2019-09-08 DIAGNOSIS — C3491 Malignant neoplasm of unspecified part of right bronchus or lung: Secondary | ICD-10-CM

## 2019-09-08 DIAGNOSIS — I2510 Atherosclerotic heart disease of native coronary artery without angina pectoris: Secondary | ICD-10-CM | POA: Insufficient documentation

## 2019-09-08 DIAGNOSIS — Z95828 Presence of other vascular implants and grafts: Secondary | ICD-10-CM

## 2019-09-08 DIAGNOSIS — K7689 Other specified diseases of liver: Secondary | ICD-10-CM | POA: Insufficient documentation

## 2019-09-08 DIAGNOSIS — R5383 Other fatigue: Secondary | ICD-10-CM

## 2019-09-08 DIAGNOSIS — E876 Hypokalemia: Secondary | ICD-10-CM

## 2019-09-08 LAB — CBC WITH DIFFERENTIAL (CANCER CENTER ONLY)
Abs Immature Granulocytes: 0.01 10*3/uL (ref 0.00–0.07)
Basophils Absolute: 0.1 10*3/uL (ref 0.0–0.1)
Basophils Relative: 1 %
Eosinophils Absolute: 0.3 10*3/uL (ref 0.0–0.5)
Eosinophils Relative: 5 %
HCT: 29.9 % — ABNORMAL LOW (ref 36.0–46.0)
Hemoglobin: 9.8 g/dL — ABNORMAL LOW (ref 12.0–15.0)
Immature Granulocytes: 0 %
Lymphocytes Relative: 21 %
Lymphs Abs: 1 10*3/uL (ref 0.7–4.0)
MCH: 32.5 pg (ref 26.0–34.0)
MCHC: 32.8 g/dL (ref 30.0–36.0)
MCV: 99 fL (ref 80.0–100.0)
Monocytes Absolute: 0.3 10*3/uL (ref 0.1–1.0)
Monocytes Relative: 7 %
Neutro Abs: 3.2 10*3/uL (ref 1.7–7.7)
Neutrophils Relative %: 66 %
Platelet Count: 195 10*3/uL (ref 150–400)
RBC: 3.02 MIL/uL — ABNORMAL LOW (ref 3.87–5.11)
RDW: 13.4 % (ref 11.5–15.5)
WBC Count: 4.9 10*3/uL (ref 4.0–10.5)
nRBC: 0 % (ref 0.0–0.2)

## 2019-09-08 LAB — CMP (CANCER CENTER ONLY)
ALT: 11 U/L (ref 0–44)
AST: 11 U/L — ABNORMAL LOW (ref 15–41)
Albumin: 3 g/dL — ABNORMAL LOW (ref 3.5–5.0)
Alkaline Phosphatase: 44 U/L (ref 38–126)
Anion gap: 6 (ref 5–15)
BUN: 28 mg/dL — ABNORMAL HIGH (ref 8–23)
CO2: 26 mmol/L (ref 22–32)
Calcium: 7.6 mg/dL — ABNORMAL LOW (ref 8.9–10.3)
Chloride: 108 mmol/L (ref 98–111)
Creatinine: 0.87 mg/dL (ref 0.44–1.00)
GFR, Est AFR Am: >60 mL/min (ref >60)
GFR, Estimated: >60 mL/min (ref >60)
Glucose, Bld: 77 mg/dL (ref 70–99)
Potassium: 2.9 mmol/L — CL (ref 3.5–5.1)
Sodium: 140 mmol/L (ref 135–145)
Total Bilirubin: 0.8 mg/dL (ref 0.3–1.2)
Total Protein: 6 g/dL — ABNORMAL LOW (ref 6.5–8.1)

## 2019-09-08 LAB — TSH: TSH: 0.721 u[IU]/mL (ref 0.308–3.960)

## 2019-09-08 MED ORDER — SODIUM CHLORIDE 0.9% FLUSH
10.0000 mL | INTRAVENOUS | Status: DC | PRN
  Administered 2019-09-08: 10 mL
  Filled 2019-09-08: qty 10

## 2019-09-08 MED ORDER — POTASSIUM CHLORIDE ER 20 MEQ PO TBCR: 20.0000 meq | EXTENDED_RELEASE_TABLET | Freq: Every day | ORAL | 0 refills | Status: DC

## 2019-09-08 MED ORDER — SODIUM CHLORIDE 0.9 % IV SOLN
Freq: Once | INTRAVENOUS | Status: AC
  Filled 2019-09-08: qty 250

## 2019-09-08 MED ORDER — HEPARIN SOD (PORK) LOCK FLUSH 100 UNIT/ML IV SOLN
500.0000 [IU] | Freq: Once | INTRAVENOUS | Status: AC | PRN
  Administered 2019-09-08: 500 [IU]
  Filled 2019-09-08: qty 5

## 2019-09-08 MED ORDER — RIVAROXABAN 20 MG PO TABS
20.0000 mg | ORAL_TABLET | Freq: Every day | ORAL | 2 refills | Status: DC
Start: 2019-09-08 — End: 2019-11-06

## 2019-09-08 MED ORDER — SODIUM CHLORIDE 0.9 % IV SOLN
200.0000 mg | Freq: Once | INTRAVENOUS | Status: AC
  Administered 2019-09-08: 200 mg via INTRAVENOUS
  Filled 2019-09-08: qty 8

## 2019-09-08 NOTE — Patient Instructions (Signed)
[facility] Discharge Instructions for Patients Receiving Chemotherapy  Today you received the following chemotherapy agents:  Keytruda.  To help prevent nausea and vomiting after your treatment, we encourage you to take your nausea medication as directed.   If you develop nausea and vomiting that is not controlled by your nausea medication, call the clinic.   BELOW ARE SYMPTOMS THAT SHOULD BE REPORTED IMMEDIATELY:  *FEVER GREATER THAN 100.5 F  *CHILLS WITH OR WITHOUT FEVER  NAUSEA AND VOMITING THAT IS NOT CONTROLLED WITH YOUR NAUSEA MEDICATION  *UNUSUAL SHORTNESS OF BREATH  *UNUSUAL BRUISING OR BLEEDING  TENDERNESS IN MOUTH AND THROAT WITH OR WITHOUT PRESENCE OF ULCERS  *URINARY PROBLEMS  *BOWEL PROBLEMS  UNUSUAL RASH Items with * indicate a potential emergency and should be followed up as soon as possible.  Feel free to call the clinic should you have any questions or concerns. The clinic phone number is [phone].  Please show the CHEMO ALERT CARD at check-in to the Emergency Department and triage nurse.    

## 2019-09-08 NOTE — Progress Notes (Signed)
Pt verbalizes understanding to pick up potassium rx today and take for the next 10 days.

## 2019-09-08 NOTE — Progress Notes (Signed)
[place] Telephone:([phone]) [phone]   Fax:([phone]  OFFICE PROGRESS NOTE  [name], MD [place] [address]  DIAGNOSIS: Stage IV non-small cell lung cancer, squamous cell carcinoma. She presented withright upper lobe lung mass in addition to pleural-based metastasis andmediastinal lymphadenopathy.She was diagnosed in September 2020.  PRIOR THERAPY: None  CURRENT THERAPY: Chemotherapy with carboplatin for an AUC of 5,paclitaxel 175 mg/m, and Keytruda 200 mg IV every 3 weeks with Neulasta support. Firsttreatmenton 12/09/2018.Status post13cycles.  Starting from cycle #5 she is currently on maintenance treatment with single agent Keytruda every 3 weeks.  INTERVAL HISTORY: Debra Rodgers 74 y.o. female returns to the clinic today for follow-up visit.  The patient is feeling fine today with no concerning complaints.  She was seen in the hospital 2 weeks ago complaining of cough and shortness of breath.  She had CT angiogram of the chest performed at that time that showed no evidence for pulmonary embolism and no acute findings.  She was treated with a tapered dose of prednisone and she is currently on 4 mg p.o. daily.  The patient is feeling fine today with no concerning complaints except for mild fatigue.  She denied having any chest pain, cough or hemoptysis.  She denied having any fever or chills.  She has no nausea, vomiting, diarrhea or constipation.  She has no headache or visual changes.  The patient is here today for evaluation before starting cycle #14.  MEDICAL HISTORY: Past Medical History:  Diagnosis Date  . COPD (chronic obstructive pulmonary disease) ([place])   . Depression   . Essential hypertension   . Headache   . History of migraine headaches   . lung ca dx'd 10/2018  . Sleep apnea   . Tobacco use disorder     ALLERGIES:  is allergic to codeine sulfate and pantoprazole sodium.  MEDICATIONS:  Current  Outpatient Medications  Medication Sig Dispense Refill  . acetaminophen (TYLENOL) 325 MG tablet Take 2 tablets (650 mg total) by mouth every 6 (six) hours as needed. 30 tablet 0  . albuterol (VENTOLIN HFA) 108 (90 Base) MCG/ACT inhaler Inhale 2 puffs into the lungs every 6 (six) hours as needed for wheezing or shortness of breath. 36 g 2  . amLODipine (NORVASC) 10 MG tablet Take 1 tablet (10 mg total) by mouth daily. 90 tablet 3  . atorvastatin (LIPITOR) 20 MG tablet Take 1 tablet (20 mg total) by mouth daily. 90 tablet 3  . azithromycin (ZITHROMAX) 250 MG tablet ONE PO daily- for 5 more days 5 each 0  . chlorthalidone (HYGROTON) 50 MG tablet Take 1 tablet (50 mg total) by mouth daily. 90 tablet 1  . famotidine (PEPCID) 20 MG tablet Take 20 mg by mouth daily.     Marland Kitchen FLUoxetine (PROZAC) 20 MG capsule Take 1 capsule (20 mg total) by mouth daily. 30 capsule 5  . guaiFENesin-dextromethorphan (ROBITUSSIN DM) 100-10 MG/5ML syrup Take 5 mLs by mouth every 4 (four) hours as needed for cough. 118 mL 0  . HYDROcodone-acetaminophen (NORCO) 7.5-325 MG tablet Take 1 tablet by mouth every 6 (six) hours as needed for moderate pain. 60 tablet 0  . lidocaine-prilocaine (EMLA) cream Apply 1 application topically as needed. (Patient taking differently: Apply 1 application topically as needed (pain). ) 30 g 0  . lisinopril (ZESTRIL) 10 MG tablet Take 1 tablet (10 mg total) by mouth daily. 90 tablet 1  . methylPREDNISolone (MEDROL) 4  MG tablet Take 1 tablet (4 mg total) by mouth daily. 21 tablet 0  . potassium chloride 20 MEQ TBCR Take 20 mEq by mouth daily. 10 tablet 0  . RIVAROXABAN (XARELTO) VTE STARTER PACK (15 & 20 MG TABLETS) Follow package directions: Take one 98m tablet by mouth twice a day. On day 22, switch to one 235mtablet once a day. Take with food. 51 each 0  . Tiotropium Bromide-Olodaterol (STIOLTO RESPIMAT) 2.5-2.5 MCG/ACT AERS Inhale 2 puffs into the lungs daily. 4 g 5   No current  facility-administered medications for this visit.    SURGICAL HISTORY:  Past Surgical History:  Procedure Laterality Date  . ABDOMINAL HYSTERECTOMY    . CHOLECYSTECTOMY    . IR IMAGING GUIDED PORT INSERTION  02/24/2019  . ROTATOR CUFF REPAIR  3/04  . VIDEO BRONCHOSCOPY WITH ENDOBRONCHIAL ULTRASOUND Right 11/25/2018   Procedure: VIDEO BRONCHOSCOPY WITH ENDOBRONCHIAL ULTRASOUND;  Surgeon: ByCollene GobbleMD;  Location: [facility] OR;  Service: Thoracic;  Laterality: Right;    REVIEW OF SYSTEMS:  A comprehensive review of systems was negative except for: Constitutional: positive for fatigue Respiratory: positive for dyspnea on exertion   PHYSICAL EXAMINATION: General appearance: alert, cooperative, fatigued and no distress Head: Normocephalic, without obvious abnormality, atraumatic Neck: no adenopathy, no JVD, supple, symmetrical, trachea midline and thyroid not enlarged, symmetric, no tenderness/mass/nodules Lymph nodes: Cervical, supraclavicular, and axillary nodes normal. Resp: clear to auscultation bilaterally Back: symmetric, no curvature. ROM normal. No CVA tenderness. Cardio: regular rate and rhythm, S1, S2 normal, no murmur, click, rub or gallop GI: soft, non-tender; bowel sounds normal; no masses,  no organomegaly Extremities: extremities normal, atraumatic, no cyanosis or edema  ECOG PERFORMANCE STATUS: 1 - Symptomatic but completely ambulatory  Blood pressure 112/77, pulse 67, temperature 97.9 F (36.6 C), temperature source Temporal, resp. rate 17, height 5' 1.5" (1.562 m), weight 138 lb 11.2 oz (62.9 kg), SpO2 99 %.  LABORATORY DATA: Lab Results  Component Value Date   WBC 4.9 09/08/2019   HGB 9.8 (L) 09/08/2019   HCT 29.9 (L) 09/08/2019   MCV 99.0 09/08/2019   PLT 195 09/08/2019      Chemistry      Component Value Date/Time   NA 137 08/24/2019 0400   NA 139 07/21/2017 0958   K 3.6 08/24/2019 0400   CL 98 08/24/2019 0400   CO2 29 08/24/2019 0400   BUN 21 08/24/2019  0400   BUN 26 07/21/2017 0958   CREATININE 1.01 (H) 08/24/2019 0400   CREATININE 1.28 (H) 08/18/2019 1040   CREATININE 0.79 09/03/2013 1540      Component Value Date/Time   CALCIUM 9.2 08/24/2019 0400   ALKPHOS 65 08/18/2019 1040   AST 12 (L) 08/18/2019 1040   ALT 10 08/18/2019 1040   BILITOT 0.9 08/18/2019 1040       RADIOGRAPHIC STUDIES: DG Chest 2 View  Result Date: 08/23/2019 CLINICAL DATA:  [age]ear old female with cough and shortness of breath four days. History of lung cancer, on chemotherapy. EXAM: CHEST - 2 VIEW COMPARISON:  08/16/2019 chest CT and prior studies FINDINGS: The cardiomediastinal silhouette is unremarkable. A RIGHT IJ Port-A-Cath is again noted. LEFT basilar atelectasis again noted. There is no evidence of focal airspace disease, pulmonary edema, pulmonary mass, pleural effusion, or pneumothorax. The tiny nodules identified on recent CT are difficult to visualize radiographically. No acute bony abnormalities are identified. IMPRESSION: Unchanged LEFT basilar atelectasis. No new abnormalities identified. Electronically Signed   By: JeCleatis Polka.  On: 08/23/2019 16:54   CT Chest W Contrast  Result Date: 08/16/2019 CLINICAL DATA:  Squamous cell carcinoma of lung. Chemotherapy and immunotherapy on going. Cough for 1 week. EXAM: CT CHEST, ABDOMEN, AND PELVIS WITH CONTRAST TECHNIQUE: Multidetector CT imaging of the chest, abdomen and pelvis was performed following the standard protocol during bolus administration of intravenous contrast. CONTRAST:  130m OMNIPAQUE IOHEXOL 300 MG/ML  SOLN COMPARISON:  06/01/2019 FINDINGS: CT CHEST FINDINGS Cardiovascular: Aortic atherosclerosis. Tortuous thoracic aorta. Juxta diaphragmatic descending thoracic aortic dilatation including at 4.1 cm is similar. Normal heart size with multivessel coronary artery atherosclerosis. No central pulmonary embolism, on this non-dedicated study. Right-sided Port-A-Cath tip at high right atrium. The  previously described right internal jugular vein thrombus has resolved. Mediastinum/Nodes: No supraclavicular adenopathy. High right paratracheal 1.2 cm node on 10/02, felt to be similar to minimally enlarged from 1.1 cm on the prior. A pretracheal node of 1.1 cm on 17/2 is enlarged from 8 mm on the prior exam (when remeasured). Low right paratracheal node measures 1.4 cm on 23/2 versus 1.2 cm on the prior exam (when remeasured). Right hilar node of 1.5 cm on 28/2 measures 1.4 cm on the prior exam (when remeasured). Fluid level in the esophagus on 28/2. Lungs/Pleura: No pleural fluid. Moderate centrilobular and paraseptal emphysema. Lower lobe predominant bronchial wall thickening. Soft tissue thickening about the lateral right middle lobe is unchanged, including on 103/6. Minimal left upper lobe subpleural nodularity at 2-3 mm on 63/6, similar. Similar 3 mm right lower lobe pulmonary nodule on 84/6. More inferior left upper lobe 3 mm nodule on 77/6, new. Medial left lower lobe 5 mm pulmonary nodule on 85/6, new. Subpleural left lower lobe 4 mm nodule on 98/6, similar. Anterior left lower lobe subpleural pulmonary nodule of 3 mm on 78/6, new. Musculoskeletal: No acute osseous abnormality. Lower cervical and upper thoracic spondylosis with prior cervical spine fixation. CT ABDOMEN PELVIS FINDINGS Hepatobiliary: Right hepatic lobe cyst and bilateral too small to characterize liver lesions. Cholecystectomy. Common duct upper normal in similar, including at 1.0 cm. Pancreas: Normal, without mass or ductal dilatation. Spleen: Normal in size, without focal abnormality. Adrenals/Urinary Tract: Normal adrenal glands. Normal kidneys, without hydronephrosis. Normal urinary bladder. Stomach/Bowel: Normal stomach, without wall thickening. Right-sided apparent colonic wall thickening, including on 78/2, is favored to be due to underdistention. Normal small bowel. Vascular/Lymphatic: Advanced aortic and branch vessel  atherosclerosis. Infrarenal aortic dilatation including at 4.3 cm on 49/2. 4.2 cm on the prior. No abdominopelvic adenopathy. Reproductive: Hysterectomy. Left adnexal cystic lesion of 6.8 x 6.3 cm on 93/2. Compare 6.6 x 6.2 cm on the prior exam. Other: No significant free fluid. Musculoskeletal: No acute osseous abnormality. Lumbosacral spondylosis. IMPRESSION: 1. Mild enlargement of thoracic nodes, suspicious for progressive nodal metastasis. 2. Bilateral pulmonary nodules, including new primarily subpleural nodules. Cannot exclude progressive pulmonary metastasis. 3. No evidence of metastatic disease in the abdomen or pelvis. 4. Further enlargement of a nonspecific left adnexal cystic lesion. 5. Aortic atherosclerosis (ICD10-I70.0), coronary artery atherosclerosis and emphysema (ICD10-J43.9). 6. Cholecystectomy with similar borderline common duct dilatation. 7. 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 8. Esophageal air fluid level suggests dysmotility or gastroesophageal reflux. 9. Apparent ascending colonic wall thickening, favored to be due to underdistention. Correlate with any abdominal/colonic complaints. Electronically Signed   By: KAbigail MiyamotoM.D.   On: 08/16/2019 15:17   CT ANGIO CHEST PE W OR WO CONTRAST  Result Date: 08/24/2019 CLINICAL DATA:  Shortness of breath for a few days. History of lung cancer EXAM: CT ANGIOGRAPHY CHEST WITH CONTRAST TECHNIQUE: Multidetector CT imaging of the chest was performed using the standard protocol during bolus administration of intravenous contrast. Multiplanar CT image reconstructions and MIPs were obtained to evaluate the vascular anatomy. CONTRAST:  56m OMNIPAQUE IOHEXOL 350 MG/ML SOLN COMPARISON:  08/16/2019 FINDINGS: Cardiovascular: Satisfactory opacification of the pulmonary arteries to the segmental level. No evidence of pulmonary embolism when allowing for areas of motion artifact.  Normal heart size. No pericardial effusion. Extensive aortic atherosclerosis. 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 This will be re-evaluated with a close follow-up interval in this patient with active malignancy. Extensive atheromatous plaque involving the aorta, mixed density. Porta catheter in good position. Mediastinum/Nodes: No interval enlargement of lymph nodes which were better evaluated on prior study with parenchymal timing Lungs/Pleura: Centrilobular emphysema and airway thickening with scattered mucoid impaction. Small pulmonary nodules as enumerated on recent staging scan. This study is affected by motion. There is no edema, consolidation, effusion, or pneumothorax. Upper Abdomen: No acute finding. Musculoskeletal: Extensive disc degeneration with mild scoliosis. Review of the MIP images confirms the above findings. IMPRESSION: 1. No evidence of pulmonary embolism. No acute or interval finding when compared to recent staging CT. 2. Motion degraded. Electronically Signed   By: JMonte FantasiaM.D.   On: 08/24/2019 09:59   CT Abdomen Pelvis W Contrast  Result Date: 08/16/2019 CLINICAL DATA:  Squamous cell carcinoma of lung. Chemotherapy and immunotherapy on going. Cough for 1 week. EXAM: CT CHEST, ABDOMEN, AND PELVIS WITH CONTRAST TECHNIQUE: Multidetector CT imaging of the chest, abdomen and pelvis was performed following the standard protocol during bolus administration of intravenous contrast. CONTRAST:  1097mOMNIPAQUE IOHEXOL 300 MG/ML  SOLN COMPARISON:  06/01/2019 FINDINGS: CT CHEST FINDINGS Cardiovascular: Aortic atherosclerosis. Tortuous thoracic aorta. Juxta diaphragmatic descending thoracic aortic dilatation including at 4.1 cm is similar. Normal heart size with multivessel coronary artery atherosclerosis. No central pulmonary embolism, on this non-dedicated study. Right-sided Port-A-Cath tip at high right  atrium. The previously described right internal jugular vein thrombus has resolved. Mediastinum/Nodes: No supraclavicular adenopathy. High right paratracheal 1.2 cm node on 10/02, felt to be similar to minimally enlarged from 1.1 cm on the prior. A pretracheal node of 1.1 cm on 17/2 is enlarged from 8 mm on the prior exam (when remeasured). Low right paratracheal node measures 1.4 cm on 23/2 versus 1.2 cm on the prior exam (when remeasured). Right hilar node of 1.5 cm on 28/2 measures 1.4 cm on the prior exam (when remeasured). Fluid level in the esophagus on 28/2. Lungs/Pleura: No pleural fluid. Moderate centrilobular and paraseptal emphysema. Lower lobe predominant bronchial wall thickening. Soft tissue thickening about the lateral right middle lobe is unchanged, including on 103/6. Minimal left upper lobe subpleural nodularity at 2-3 mm on 63/6, similar. Similar 3 mm right lower lobe pulmonary nodule on 84/6. More inferior left upper lobe 3 mm nodule on 77/6, new. Medial left lower lobe 5 mm pulmonary nodule on 85/6, new. Subpleural left lower lobe 4 mm nodule on 98/6, similar. Anterior left lower lobe subpleural pulmonary nodule of 3 mm on 78/6, new. Musculoskeletal: No acute osseous abnormality. Lower cervical and upper thoracic spondylosis with prior cervical spine fixation. CT ABDOMEN PELVIS FINDINGS Hepatobiliary: Right hepatic lobe cyst and bilateral too small to characterize liver lesions. Cholecystectomy. Common duct upper normal in similar, including at 1.0 cm.  Pancreas: Normal, without mass or ductal dilatation. Spleen: Normal in size, without focal abnormality. Adrenals/Urinary Tract: Normal adrenal glands. Normal kidneys, without hydronephrosis. Normal urinary bladder. Stomach/Bowel: Normal stomach, without wall thickening. Right-sided apparent colonic wall thickening, including on 78/2, is favored to be due to underdistention. Normal small bowel. Vascular/Lymphatic: Advanced aortic and branch vessel  atherosclerosis. Infrarenal aortic dilatation including at 4.3 cm on 49/2. 4.2 cm on the prior. No abdominopelvic adenopathy. Reproductive: Hysterectomy. Left adnexal cystic lesion of 6.8 x 6.3 cm on 93/2. Compare 6.6 x 6.2 cm on the prior exam. Other: No significant free fluid. Musculoskeletal: No acute osseous abnormality. Lumbosacral spondylosis. IMPRESSION: 1. Mild enlargement of thoracic nodes, suspicious for progressive nodal metastasis. 2. Bilateral pulmonary nodules, including new primarily subpleural nodules. Cannot exclude progressive pulmonary metastasis. 3. No evidence of metastatic disease in the abdomen or pelvis. 4. Further enlargement of a nonspecific left adnexal cystic lesion. 5. Aortic atherosclerosis (ICD10-I70.0), coronary artery atherosclerosis and emphysema (ICD10-J43.9). 6. Cholecystectomy with similar borderline common duct dilatation. 7. 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 8. Esophageal air fluid level suggests dysmotility or gastroesophageal reflux. 9. Apparent ascending colonic wall thickening, favored to be due to underdistention. Correlate with any abdominal/colonic complaints. Electronically Signed   By: Abigail Miyamoto M.D.   On: 08/16/2019 15:17    ASSESSMENT AND PLAN: This is a very pleasant 74 years old African-American female with stage IV non-small cell carcinoma,, squamous cell carcinoma diagnosed in September 2020.  She presented with extensive right-sided pleural and thoracic nodal hypermetabolic disease with no extrathoracic disease. The patient started induction treatment with systemic chemotherapy with carboplatin, paclitaxel and Keytruda status post 4 cycles with partial response after cycle #4.  She is currently undergoing maintenance treatment with single agent Keytruda status post 9 cycles. The patient has been tolerating her treatment well with no concerning adverse effects. I  recommended for her to proceed with cycle #10 of the maintenance therapy with Keytruda today. For the history of deep venous thrombosis, I will give her a refill of Xarelto. She will come back for follow-up visit in 3 weeks for evaluation before the next cycle of her treatment. She was advised to call immediately if she has any concerning symptoms in the interval. The patient voices understanding of current disease status and treatment options and is in agreement with the current care plan.  All questions were answered. The patient knows to call the clinic with any problems, questions or concerns. We can certainly see the patient much sooner if necessary.  Disclaimer: This note was dictated with voice recognition software. Similar sounding words can inadvertently be transcribed and may not be corrected upon review.

## 2019-09-08 NOTE — Progress Notes (Signed)
Pt verbalizes understanding to start taking OTC calcium supplement at home as directed per pharmacy and Abelina Bachelor, RN

## 2019-09-13 ENCOUNTER — Other Ambulatory Visit: Payer: Self-pay | Admitting: Emergency Medicine

## 2019-09-21 ENCOUNTER — Other Ambulatory Visit: Payer: Self-pay | Admitting: Internal Medicine

## 2019-09-21 DIAGNOSIS — E876 Hypokalemia: Secondary | ICD-10-CM

## 2019-09-29 ENCOUNTER — Inpatient Hospital Stay: Payer: Medicare HMO | Attending: Physician Assistant | Admitting: Internal Medicine

## 2019-09-29 ENCOUNTER — Encounter: Payer: Self-pay | Admitting: Internal Medicine

## 2019-09-29 ENCOUNTER — Inpatient Hospital Stay: Payer: Medicare HMO

## 2019-09-29 ENCOUNTER — Other Ambulatory Visit: Payer: Self-pay

## 2019-09-29 VITALS — BP 101/70 | HR 69 | Temp 97.7°F | Resp 17 | Ht 61.5 in | Wt 139.2 lb

## 2019-09-29 DIAGNOSIS — C3491 Malignant neoplasm of unspecified part of right bronchus or lung: Secondary | ICD-10-CM

## 2019-09-29 DIAGNOSIS — C3490 Malignant neoplasm of unspecified part of unspecified bronchus or lung: Secondary | ICD-10-CM

## 2019-09-29 DIAGNOSIS — Z5112 Encounter for antineoplastic immunotherapy: Secondary | ICD-10-CM

## 2019-09-29 DIAGNOSIS — C771 Secondary and unspecified malignant neoplasm of intrathoracic lymph nodes: Secondary | ICD-10-CM | POA: Diagnosis not present

## 2019-09-29 DIAGNOSIS — G4730 Sleep apnea, unspecified: Secondary | ICD-10-CM | POA: Insufficient documentation

## 2019-09-29 DIAGNOSIS — J449 Chronic obstructive pulmonary disease, unspecified: Secondary | ICD-10-CM | POA: Insufficient documentation

## 2019-09-29 DIAGNOSIS — Z79899 Other long term (current) drug therapy: Secondary | ICD-10-CM | POA: Diagnosis not present

## 2019-09-29 DIAGNOSIS — I10 Essential (primary) hypertension: Secondary | ICD-10-CM | POA: Diagnosis not present

## 2019-09-29 DIAGNOSIS — Z86718 Personal history of other venous thrombosis and embolism: Secondary | ICD-10-CM | POA: Insufficient documentation

## 2019-09-29 DIAGNOSIS — F17210 Nicotine dependence, cigarettes, uncomplicated: Secondary | ICD-10-CM | POA: Diagnosis not present

## 2019-09-29 DIAGNOSIS — Z7901 Long term (current) use of anticoagulants: Secondary | ICD-10-CM | POA: Diagnosis not present

## 2019-09-29 DIAGNOSIS — Z95828 Presence of other vascular implants and grafts: Secondary | ICD-10-CM

## 2019-09-29 DIAGNOSIS — F329 Major depressive disorder, single episode, unspecified: Secondary | ICD-10-CM | POA: Insufficient documentation

## 2019-09-29 DIAGNOSIS — C3411 Malignant neoplasm of upper lobe, right bronchus or lung: Secondary | ICD-10-CM

## 2019-09-29 DIAGNOSIS — C782 Secondary malignant neoplasm of pleura: Secondary | ICD-10-CM | POA: Diagnosis not present

## 2019-09-29 DIAGNOSIS — K5730 Diverticulosis of large intestine without perforation or abscess without bleeding: Secondary | ICD-10-CM | POA: Insufficient documentation

## 2019-09-29 DIAGNOSIS — R599 Enlarged lymph nodes, unspecified: Secondary | ICD-10-CM | POA: Diagnosis not present

## 2019-09-29 DIAGNOSIS — R5383 Other fatigue: Secondary | ICD-10-CM

## 2019-09-29 DIAGNOSIS — Z87891 Personal history of nicotine dependence: Secondary | ICD-10-CM | POA: Insufficient documentation

## 2019-09-29 DIAGNOSIS — I5023 Acute on chronic systolic (congestive) heart failure: Secondary | ICD-10-CM | POA: Insufficient documentation

## 2019-09-29 LAB — CMP (CANCER CENTER ONLY)
ALT: 13 U/L (ref 0–44)
AST: 14 U/L — ABNORMAL LOW (ref 15–41)
Albumin: 3.7 g/dL (ref 3.5–5.0)
Alkaline Phosphatase: 59 U/L (ref 38–126)
Anion gap: 10 (ref 5–15)
BUN: 23 mg/dL (ref 8–23)
CO2: 27 mmol/L (ref 22–32)
Calcium: 10.1 mg/dL (ref 8.9–10.3)
Chloride: 102 mmol/L (ref 98–111)
Creatinine: 1.15 mg/dL — ABNORMAL HIGH (ref 0.44–1.00)
GFR, Est AFR Am: 55 mL/min — ABNORMAL LOW (ref >60)
GFR, Estimated: 47 mL/min — ABNORMAL LOW (ref >60)
Glucose, Bld: 84 mg/dL (ref 70–99)
Potassium: 3.8 mmol/L (ref 3.5–5.1)
Sodium: 139 mmol/L (ref 135–145)
Total Bilirubin: 0.6 mg/dL (ref 0.3–1.2)
Total Protein: 7.5 g/dL (ref 6.5–8.1)

## 2019-09-29 LAB — CBC WITH DIFFERENTIAL (CANCER CENTER ONLY)
Abs Immature Granulocytes: 0 10*3/uL (ref 0.00–0.07)
Basophils Absolute: 0 10*3/uL (ref 0.0–0.1)
Basophils Relative: 1 %
Eosinophils Absolute: 0.3 10*3/uL (ref 0.0–0.5)
Eosinophils Relative: 10 %
HCT: 34.9 % — ABNORMAL LOW (ref 36.0–46.0)
Hemoglobin: 11.8 g/dL — ABNORMAL LOW (ref 12.0–15.0)
Immature Granulocytes: 0 %
Lymphocytes Relative: 28 %
Lymphs Abs: 1 10*3/uL (ref 0.7–4.0)
MCH: 33.4 pg (ref 26.0–34.0)
MCHC: 33.8 g/dL (ref 30.0–36.0)
MCV: 98.9 fL (ref 80.0–100.0)
Monocytes Absolute: 0.4 10*3/uL (ref 0.1–1.0)
Monocytes Relative: 10 %
Neutro Abs: 1.8 10*3/uL (ref 1.7–7.7)
Neutrophils Relative %: 51 %
Platelet Count: 185 10*3/uL (ref 150–400)
RBC: 3.53 MIL/uL — ABNORMAL LOW (ref 3.87–5.11)
RDW: 13.5 % (ref 11.5–15.5)
WBC Count: 3.6 10*3/uL — ABNORMAL LOW (ref 4.0–10.5)
nRBC: 0 % (ref 0.0–0.2)

## 2019-09-29 LAB — TSH: TSH: 1.929 u[IU]/mL (ref 0.308–3.960)

## 2019-09-29 MED ORDER — SODIUM CHLORIDE 0.9% FLUSH
10.0000 mL | INTRAVENOUS | Status: DC | PRN
  Administered 2019-09-29: 10 mL
  Filled 2019-09-29: qty 10

## 2019-09-29 MED ORDER — SODIUM CHLORIDE 0.9 % IV SOLN
Freq: Once | INTRAVENOUS | Status: AC
  Filled 2019-09-29: qty 250

## 2019-09-29 MED ORDER — HEPARIN SOD (PORK) LOCK FLUSH 100 UNIT/ML IV SOLN
500.0000 [IU] | Freq: Once | INTRAVENOUS | Status: AC | PRN
  Administered 2019-09-29: 500 [IU]
  Filled 2019-09-29: qty 5

## 2019-09-29 MED ORDER — SODIUM CHLORIDE 0.9 % IV SOLN
200.0000 mg | Freq: Once | INTRAVENOUS | Status: AC
  Administered 2019-09-29: 200 mg via INTRAVENOUS
  Filled 2019-09-29: qty 8

## 2019-09-29 NOTE — Patient Instructions (Signed)
[facility] Discharge Instructions for Patients Receiving Chemotherapy  Today you received the following chemotherapy agents:  Keytruda.  To help prevent nausea and vomiting after your treatment, we encourage you to take your nausea medication as directed.   If you develop nausea and vomiting that is not controlled by your nausea medication, call the clinic.   BELOW ARE SYMPTOMS THAT SHOULD BE REPORTED IMMEDIATELY:  *FEVER GREATER THAN 100.5 F  *CHILLS WITH OR WITHOUT FEVER  NAUSEA AND VOMITING THAT IS NOT CONTROLLED WITH YOUR NAUSEA MEDICATION  *UNUSUAL SHORTNESS OF BREATH  *UNUSUAL BRUISING OR BLEEDING  TENDERNESS IN MOUTH AND THROAT WITH OR WITHOUT PRESENCE OF ULCERS  *URINARY PROBLEMS  *BOWEL PROBLEMS  UNUSUAL RASH Items with * indicate a potential emergency and should be followed up as soon as possible.  Feel free to call the clinic should you have any questions or concerns. The clinic phone number is [phone].  Please show the CHEMO ALERT CARD at check-in to the Emergency Department and triage nurse.    

## 2019-09-29 NOTE — Progress Notes (Signed)
[place] [phone]   [phone]  OFFICE PROGRESS NOTE  [name], MD [place] [address]  DIAGNOSIS: Stage IV non-small cell lung cancer, squamous cell carcinoma. She presented withright upper lobe lung mass in addition to pleural-based metastasis andmediastinal lymphadenopathy.She was diagnosed in September 2020.  PRIOR THERAPY: None  CURRENT THERAPY: Chemotherapy with carboplatin for an AUC of 5,paclitaxel 175 mg/m, and Keytruda 200 mg IV every 3 weeks with Neulasta support. Firsttreatmenton 12/09/2018.Status post14cycles.  Starting from cycle #5 she is currently on maintenance treatment with single agent Keytruda every 3 weeks.  INTERVAL HISTORY: Debra Rodgers 74 y.o. female returns to the clinic today for follow-up visit. The patient is feeling fine today with no concerning complaints. She denied having any current chest pain, shortness of breath, cough or hemoptysis. She denied having any fever or chills. She has no nausea, vomiting, diarrhea or constipation. She denied having any bleeding issues. She continues to tolerate her treatment with single Keytruda fairly well. The patient is here today for evaluation before starting cycle #15.  MEDICAL HISTORY: Past Medical History:  Diagnosis Date  . COPD (chronic obstructive pulmonary disease) ([place])   . Depression   . Essential hypertension   . Headache   . History of migraine headaches   . lung ca dx'd 10/2018  . Sleep apnea   . Tobacco use disorder     ALLERGIES:  is allergic to codeine sulfate and pantoprazole sodium.  MEDICATIONS:  Current Outpatient Medications  Medication Sig Dispense Refill  . acetaminophen (TYLENOL) 325 MG tablet Take 2 tablets (650 mg total) by mouth every 6 (six) hours as needed. 30 tablet 0  . albuterol (VENTOLIN HFA) 108 (90 Base) MCG/ACT inhaler Inhale 2 puffs into the lungs every 6 (six) hours as needed for  wheezing or shortness of breath. 36 g 2  . amLODipine (NORVASC) 10 MG tablet Take 1 tablet (10 mg total) by mouth daily. 90 tablet 3  . atorvastatin (LIPITOR) 20 MG tablet Take 1 tablet (20 mg total) by mouth daily. 90 tablet 3  . chlorthalidone (HYGROTON) 50 MG tablet Take 1 tablet (50 mg total) by mouth daily. 90 tablet 1  . famotidine (PEPCID) 20 MG tablet Take 20 mg by mouth daily.     Marland Kitchen FLUoxetine (PROZAC) 20 MG capsule Take 1 capsule (20 mg total) by mouth daily. 30 capsule 5  . guaiFENesin-dextromethorphan (ROBITUSSIN DM) 100-10 MG/5ML syrup Take 5 mLs by mouth every 4 (four) hours as needed for cough. 118 mL 0  . HYDROcodone-acetaminophen (NORCO) 7.5-325 MG tablet Take 1 tablet by mouth every 6 (six) hours as needed for moderate pain. 60 tablet 0  . lidocaine-prilocaine (EMLA) cream Apply 1 application topically as needed. (Patient taking differently: Apply 1 application topically as needed (pain). ) 30 g 0  . lisinopril (ZESTRIL) 10 MG tablet Take 1 tablet (10 mg total) by mouth daily. 90 tablet 1  . methylPREDNISolone (MEDROL) 4 MG tablet Take 1 tablet (4 mg total) by mouth daily. 21 tablet 0  . Potassium Chloride ER 20 MEQ TBCR TAKE 1 TABLET BY MOUTH EVERY DAY 10 tablet 0  . rivaroxaban (XARELTO) 20 MG TABS tablet Take 1 tablet (20 mg total) by mouth daily with supper. 30 tablet 2  . STIOLTO RESPIMAT 2.5-2.5 MCG/ACT AERS INHALE 2 PUFFS BY MOUTH INTO THE LUNGS DAILY 4 g 5   No current facility-administered medications for this visit.  SURGICAL HISTORY:  Past Surgical History:  Procedure Laterality Date  . ABDOMINAL HYSTERECTOMY    . CHOLECYSTECTOMY    . IR IMAGING GUIDED PORT INSERTION  02/24/2019  . ROTATOR CUFF REPAIR  3/04  . VIDEO BRONCHOSCOPY WITH ENDOBRONCHIAL ULTRASOUND Right 11/25/2018   Procedure: VIDEO BRONCHOSCOPY WITH ENDOBRONCHIAL ULTRASOUND;  Surgeon: Collene Gobble, MD;  Location: [facility];  Service: Thoracic;  Laterality: Right;    REVIEW OF SYSTEMS:  A  comprehensive review of systems was negative except for: Constitutional: positive for fatigue   PHYSICAL EXAMINATION: General appearance: alert, cooperative and no distress Head: Normocephalic, without obvious abnormality, atraumatic Neck: no adenopathy, no JVD, supple, symmetrical, trachea midline and thyroid not enlarged, symmetric, no tenderness/mass/nodules Lymph nodes: Cervical, supraclavicular, and axillary nodes normal. Resp: clear to auscultation bilaterally Back: symmetric, no curvature. ROM normal. No CVA tenderness. Cardio: regular rate and rhythm, S1, S2 normal, no murmur, click, rub or gallop GI: soft, non-tender; bowel sounds normal; no masses,  no organomegaly Extremities: extremities normal, atraumatic, no cyanosis or edema  ECOG PERFORMANCE STATUS: 1 - Symptomatic but completely ambulatory  Blood pressure 101/70, pulse 69, temperature 97.7 F (36.5 C), temperature source Temporal, resp. rate 17, height 5' 1.5" (1.562 m), weight 139 lb 3.2 oz (63.1 kg), SpO2 98 %.  LABORATORY DATA: Lab Results  Component Value Date   WBC 3.6 (L) 09/29/2019   HGB 11.8 (L) 09/29/2019   HCT 34.9 (L) 09/29/2019   MCV 98.9 09/29/2019   PLT 185 09/29/2019      Chemistry      Component Value Date/Time   NA 139 09/29/2019 1020   NA 139 07/21/2017 0958   K 3.8 09/29/2019 1020   CL 102 09/29/2019 1020   CO2 27 09/29/2019 1020   BUN 23 09/29/2019 1020   BUN 26 07/21/2017 0958   CREATININE 1.15 (H) 09/29/2019 1020   CREATININE 0.79 09/03/2013 1540      Component Value Date/Time   CALCIUM 10.1 09/29/2019 1020   ALKPHOS 59 09/29/2019 1020   AST 14 (L) 09/29/2019 1020   ALT 13 09/29/2019 1020   BILITOT 0.6 09/29/2019 1020       RADIOGRAPHIC STUDIES: No results found.  ASSESSMENT AND PLAN: This is a very pleasant 74 years old African-American female with stage IV non-small cell carcinoma,, squamous cell carcinoma diagnosed in September 2020.  She presented with extensive  right-sided pleural and thoracic nodal hypermetabolic disease with no extrathoracic disease. The patient started induction treatment with systemic chemotherapy with carboplatin, paclitaxel and Keytruda status post 4 cycles with partial response after cycle #4.  She is currently undergoing maintenance treatment with single agent Keytruda status post 10 cycles. She has been tolerating this treatment well with no concerning adverse effects. I recommended for the patient to proceed with cycle #11 today of his maintenance Keytruda as planned. She will come back for follow-up visit in 3 weeks for evaluation with repeat CT scan of the chest, abdomen pelvis for restaging of her disease. For the history of deep venous thrombosis, I will give her a refill of Xarelto. The patient was advised to call immediately if she has any concerning symptoms in the interval. The patient voices understanding of current disease status and treatment options and is in agreement with the current care plan. All questions were answered. The patient knows to call the clinic with any problems, questions or concerns. We can certainly see the patient much sooner if necessary.  Disclaimer: This note was dictated with voice  recognition software. Similar sounding words can inadvertently be transcribed and may not be corrected upon review.

## 2019-09-30 ENCOUNTER — Telehealth: Payer: Self-pay | Admitting: Internal Medicine

## 2019-09-30 NOTE — Telephone Encounter (Signed)
Scheduled appt per 7/7 los - unable to reach pt on mobile or house. Left message on house line with appt date and time

## 2019-10-03 ENCOUNTER — Other Ambulatory Visit: Payer: Self-pay | Admitting: Internal Medicine

## 2019-10-03 DIAGNOSIS — E876 Hypokalemia: Secondary | ICD-10-CM

## 2019-10-10 ENCOUNTER — Inpatient Hospital Stay (HOSPITAL_COMMUNITY)
Admission: EM | Admit: 2019-10-10 | Discharge: 2019-10-17 | DRG: 190 | Disposition: A | Discharge status: Home Health | Payer: Medicare HMO | Attending: Internal Medicine | Admitting: Internal Medicine

## 2019-10-10 ENCOUNTER — Encounter (HOSPITAL_COMMUNITY): Payer: Self-pay

## 2019-10-10 ENCOUNTER — Emergency Department (HOSPITAL_COMMUNITY): Payer: Medicare HMO

## 2019-10-10 ENCOUNTER — Other Ambulatory Visit: Payer: Self-pay

## 2019-10-10 DIAGNOSIS — Z79899 Other long term (current) drug therapy: Secondary | ICD-10-CM | POA: Diagnosis not present

## 2019-10-10 DIAGNOSIS — I10 Essential (primary) hypertension: Secondary | ICD-10-CM | POA: Diagnosis not present

## 2019-10-10 DIAGNOSIS — Z87891 Personal history of nicotine dependence: Secondary | ICD-10-CM

## 2019-10-10 DIAGNOSIS — F329 Major depressive disorder, single episode, unspecified: Secondary | ICD-10-CM | POA: Diagnosis present

## 2019-10-10 DIAGNOSIS — J9621 Acute and chronic respiratory failure with hypoxia: Secondary | ICD-10-CM | POA: Diagnosis present

## 2019-10-10 DIAGNOSIS — I82409 Acute embolism and thrombosis of unspecified deep veins of unspecified lower extremity: Secondary | ICD-10-CM | POA: Diagnosis present

## 2019-10-10 DIAGNOSIS — Z90710 Acquired absence of both cervix and uterus: Secondary | ICD-10-CM | POA: Diagnosis not present

## 2019-10-10 DIAGNOSIS — K219 Gastro-esophageal reflux disease without esophagitis: Secondary | ICD-10-CM | POA: Diagnosis present

## 2019-10-10 DIAGNOSIS — I110 Hypertensive heart disease with heart failure: Secondary | ICD-10-CM | POA: Diagnosis present

## 2019-10-10 DIAGNOSIS — G4733 Obstructive sleep apnea (adult) (pediatric): Secondary | ICD-10-CM | POA: Diagnosis present

## 2019-10-10 DIAGNOSIS — I5032 Chronic diastolic (congestive) heart failure: Secondary | ICD-10-CM | POA: Diagnosis present

## 2019-10-10 DIAGNOSIS — Z9049 Acquired absence of other specified parts of digestive tract: Secondary | ICD-10-CM

## 2019-10-10 DIAGNOSIS — Z7901 Long term (current) use of anticoagulants: Secondary | ICD-10-CM

## 2019-10-10 DIAGNOSIS — Z9221 Personal history of antineoplastic chemotherapy: Secondary | ICD-10-CM | POA: Diagnosis not present

## 2019-10-10 DIAGNOSIS — Z885 Allergy status to narcotic agent status: Secondary | ICD-10-CM

## 2019-10-10 DIAGNOSIS — R0603 Acute respiratory distress: Secondary | ICD-10-CM | POA: Diagnosis present

## 2019-10-10 DIAGNOSIS — R0789 Other chest pain: Secondary | ICD-10-CM | POA: Diagnosis not present

## 2019-10-10 DIAGNOSIS — Z720 Tobacco use: Secondary | ICD-10-CM | POA: Diagnosis present

## 2019-10-10 DIAGNOSIS — I8290 Acute embolism and thrombosis of unspecified vein: Secondary | ICD-10-CM | POA: Diagnosis not present

## 2019-10-10 DIAGNOSIS — C3491 Malignant neoplasm of unspecified part of right bronchus or lung: Secondary | ICD-10-CM | POA: Diagnosis present

## 2019-10-10 DIAGNOSIS — Z86718 Personal history of other venous thrombosis and embolism: Secondary | ICD-10-CM | POA: Diagnosis not present

## 2019-10-10 DIAGNOSIS — J9601 Acute respiratory failure with hypoxia: Secondary | ICD-10-CM | POA: Diagnosis present

## 2019-10-10 DIAGNOSIS — J449 Chronic obstructive pulmonary disease, unspecified: Secondary | ICD-10-CM | POA: Diagnosis not present

## 2019-10-10 DIAGNOSIS — Z20822 Contact with and (suspected) exposure to covid-19: Secondary | ICD-10-CM | POA: Diagnosis present

## 2019-10-10 DIAGNOSIS — D72823 Leukemoid reaction: Secondary | ICD-10-CM | POA: Diagnosis not present

## 2019-10-10 DIAGNOSIS — Z888 Allergy status to other drugs, medicaments and biological substances status: Secondary | ICD-10-CM

## 2019-10-10 DIAGNOSIS — Z7952 Long term (current) use of systemic steroids: Secondary | ICD-10-CM

## 2019-10-10 DIAGNOSIS — R079 Chest pain, unspecified: Secondary | ICD-10-CM

## 2019-10-10 DIAGNOSIS — E785 Hyperlipidemia, unspecified: Secondary | ICD-10-CM | POA: Diagnosis present

## 2019-10-10 DIAGNOSIS — J441 Chronic obstructive pulmonary disease with (acute) exacerbation: Principal | ICD-10-CM | POA: Diagnosis present

## 2019-10-10 DIAGNOSIS — I5023 Acute on chronic systolic (congestive) heart failure: Secondary | ICD-10-CM | POA: Diagnosis not present

## 2019-10-10 DIAGNOSIS — R590 Localized enlarged lymph nodes: Secondary | ICD-10-CM | POA: Diagnosis present

## 2019-10-10 DIAGNOSIS — Z8249 Family history of ischemic heart disease and other diseases of the circulatory system: Secondary | ICD-10-CM | POA: Diagnosis not present

## 2019-10-10 DIAGNOSIS — C782 Secondary malignant neoplasm of pleura: Secondary | ICD-10-CM | POA: Diagnosis present

## 2019-10-10 DIAGNOSIS — T380X5A Adverse effect of glucocorticoids and synthetic analogues, initial encounter: Secondary | ICD-10-CM | POA: Diagnosis not present

## 2019-10-10 DIAGNOSIS — I714 Abdominal aortic aneurysm, without rupture: Secondary | ICD-10-CM | POA: Diagnosis present

## 2019-10-10 LAB — BRAIN NATRIURETIC PEPTIDE: B Natriuretic Peptide: 20.5 pg/mL (ref 0.0–100.0)

## 2019-10-10 LAB — BASIC METABOLIC PANEL
Anion gap: 12 (ref 5–15)
BUN: 23 mg/dL (ref 8–23)
CO2: 29 mmol/L (ref 22–32)
Calcium: 9.8 mg/dL (ref 8.9–10.3)
Chloride: 98 mmol/L (ref 98–111)
Creatinine, Ser: 0.96 mg/dL (ref 0.44–1.00)
GFR calc Af Amer: >60 mL/min (ref >60)
GFR calc non Af Amer: 59 mL/min — ABNORMAL LOW (ref >60)
Glucose, Bld: 85 mg/dL (ref 70–99)
Potassium: 4.1 mmol/L (ref 3.5–5.1)
Sodium: 139 mmol/L (ref 135–145)

## 2019-10-10 LAB — CBC WITH DIFFERENTIAL/PLATELET
Abs Immature Granulocytes: 0.01 10*3/uL (ref 0.00–0.07)
Basophils Absolute: 0.1 10*3/uL (ref 0.0–0.1)
Basophils Relative: 1 %
Eosinophils Absolute: 1.2 10*3/uL — ABNORMAL HIGH (ref 0.0–0.5)
Eosinophils Relative: 22 %
HCT: 38 % (ref 36.0–46.0)
Hemoglobin: 12.7 g/dL (ref 12.0–15.0)
Immature Granulocytes: 0 %
Lymphocytes Relative: 23 %
Lymphs Abs: 1.2 10*3/uL (ref 0.7–4.0)
MCH: 33.6 pg (ref 26.0–34.0)
MCHC: 33.4 g/dL (ref 30.0–36.0)
MCV: 100.5 fL — ABNORMAL HIGH (ref 80.0–100.0)
Monocytes Absolute: 0.6 10*3/uL (ref 0.1–1.0)
Monocytes Relative: 12 %
Neutro Abs: 2.2 10*3/uL (ref 1.7–7.7)
Neutrophils Relative %: 42 %
Platelets: 229 10*3/uL (ref 150–400)
RBC: 3.78 MIL/uL — ABNORMAL LOW (ref 3.87–5.11)
RDW: 13.2 % (ref 11.5–15.5)
WBC: 5.3 10*3/uL (ref 4.0–10.5)
nRBC: 0 % (ref 0.0–0.2)

## 2019-10-10 LAB — SARS CORONAVIRUS 2 BY RT PCR (HOSPITAL ORDER, PERFORMED IN ~~LOC~~ HOSPITAL LAB): SARS Coronavirus 2: NEGATIVE

## 2019-10-10 MED ORDER — FAMOTIDINE 20 MG PO TABS
20.0000 mg | ORAL_TABLET | Freq: Every day | ORAL | Status: DC
  Administered 2019-10-11 – 2019-10-13 (×3): 20 mg via ORAL
  Filled 2019-10-10 (×3): qty 1

## 2019-10-10 MED ORDER — LISINOPRIL 10 MG PO TABS
10.0000 mg | ORAL_TABLET | Freq: Every day | ORAL | Status: DC
  Administered 2019-10-11: 10 mg via ORAL
  Filled 2019-10-10: qty 1

## 2019-10-10 MED ORDER — MAGNESIUM SULFATE 2 GM/50ML IV SOLN
2.0000 g | Freq: Once | INTRAVENOUS | Status: AC
  Administered 2019-10-10: 2 g via INTRAVENOUS
  Filled 2019-10-10: qty 50

## 2019-10-10 MED ORDER — ATORVASTATIN CALCIUM 20 MG PO TABS
20.0000 mg | ORAL_TABLET | Freq: Every day | ORAL | Status: DC
  Administered 2019-10-11 – 2019-10-17 (×7): 20 mg via ORAL
  Filled 2019-10-10 (×7): qty 1

## 2019-10-10 MED ORDER — AMLODIPINE BESYLATE 10 MG PO TABS
10.0000 mg | ORAL_TABLET | Freq: Every day | ORAL | Status: DC
  Administered 2019-10-11 – 2019-10-17 (×7): 10 mg via ORAL
  Filled 2019-10-10 (×7): qty 1

## 2019-10-10 MED ORDER — RIVAROXABAN 20 MG PO TABS
20.0000 mg | ORAL_TABLET | Freq: Every day | ORAL | Status: DC
  Administered 2019-10-10 – 2019-10-16 (×7): 20 mg via ORAL
  Filled 2019-10-10 (×8): qty 1

## 2019-10-10 MED ORDER — ACETAMINOPHEN 325 MG PO TABS
650.0000 mg | ORAL_TABLET | Freq: Four times a day (QID) | ORAL | Status: DC | PRN
  Administered 2019-10-11 – 2019-10-14 (×3): 650 mg via ORAL
  Filled 2019-10-10 (×3): qty 2

## 2019-10-10 MED ORDER — IPRATROPIUM-ALBUTEROL 0.5-2.5 (3) MG/3ML IN SOLN
3.0000 mL | Freq: Four times a day (QID) | RESPIRATORY_TRACT | Status: AC
  Administered 2019-10-10 – 2019-10-11 (×3): 3 mL via RESPIRATORY_TRACT
  Filled 2019-10-10 (×3): qty 3

## 2019-10-10 MED ORDER — IPRATROPIUM-ALBUTEROL 0.5-2.5 (3) MG/3ML IN SOLN
3.0000 mL | RESPIRATORY_TRACT | Status: AC
  Administered 2019-10-10 (×3): 3 mL via RESPIRATORY_TRACT
  Filled 2019-10-10 (×2): qty 3

## 2019-10-10 MED ORDER — METHYLPREDNISOLONE SODIUM SUCC 125 MG IJ SOLR
125.0000 mg | Freq: Once | INTRAMUSCULAR | Status: AC
  Administered 2019-10-10: 125 mg via INTRAVENOUS
  Filled 2019-10-10: qty 2

## 2019-10-10 MED ORDER — ALBUTEROL (5 MG/ML) CONTINUOUS INHALATION SOLN
5.0000 mg/h | INHALATION_SOLUTION | RESPIRATORY_TRACT | Status: DC
  Administered 2019-10-10: 5 mg/h via RESPIRATORY_TRACT
  Filled 2019-10-10: qty 20

## 2019-10-10 MED ORDER — BENZONATATE 100 MG PO CAPS
100.0000 mg | ORAL_CAPSULE | Freq: Three times a day (TID) | ORAL | Status: DC | PRN
  Administered 2019-10-10 – 2019-10-16 (×5): 100 mg via ORAL
  Filled 2019-10-10 (×5): qty 1

## 2019-10-10 MED ORDER — IPRATROPIUM-ALBUTEROL 0.5-2.5 (3) MG/3ML IN SOLN
3.0000 mL | RESPIRATORY_TRACT | Status: DC | PRN
  Administered 2019-10-10 – 2019-10-11 (×3): 3 mL via RESPIRATORY_TRACT
  Filled 2019-10-10 (×3): qty 3

## 2019-10-10 MED ORDER — METHYLPREDNISOLONE SODIUM SUCC 40 MG IJ SOLR: 40.0000 mg | Freq: Two times a day (BID) | INTRAMUSCULAR | Status: DC

## 2019-10-10 MED ORDER — METHYLPREDNISOLONE SODIUM SUCC 125 MG IJ SOLR
60.0000 mg | Freq: Three times a day (TID) | INTRAMUSCULAR | Status: DC
  Administered 2019-10-10 – 2019-10-15 (×15): 60 mg via INTRAVENOUS
  Filled 2019-10-10 (×15): qty 2

## 2019-10-10 MED ORDER — GUAIFENESIN-DM 100-10 MG/5ML PO SYRP
5.0000 mL | ORAL_SOLUTION | ORAL | Status: DC | PRN
  Administered 2019-10-10 – 2019-10-17 (×6): 5 mL via ORAL
  Filled 2019-10-10 (×7): qty 10

## 2019-10-10 MED ORDER — CHLORTHALIDONE 50 MG PO TABS
50.0000 mg | ORAL_TABLET | Freq: Every day | ORAL | Status: DC
  Administered 2019-10-11 – 2019-10-17 (×7): 50 mg via ORAL
  Filled 2019-10-10 (×7): qty 1

## 2019-10-10 MED ORDER — FLUOXETINE HCL 20 MG PO CAPS
20.0000 mg | ORAL_CAPSULE | Freq: Every day | ORAL | Status: DC
  Administered 2019-10-11 – 2019-10-17 (×7): 20 mg via ORAL
  Filled 2019-10-10 (×7): qty 1

## 2019-10-10 MED ORDER — PHENOL 1.4 % MT LIQD
1.0000 | OROMUCOSAL | Status: DC | PRN
  Administered 2019-10-10: 1 via OROMUCOSAL
  Filled 2019-10-10: qty 177

## 2019-10-10 MED ORDER — ACETAMINOPHEN 650 MG RE SUPP: 650.0000 mg | Freq: Four times a day (QID) | RECTAL | Status: DC | PRN

## 2019-10-10 MED ORDER — HYDROCODONE-ACETAMINOPHEN 7.5-325 MG PO TABS
1.0000 | ORAL_TABLET | Freq: Four times a day (QID) | ORAL | Status: DC | PRN
  Administered 2019-10-10 – 2019-10-16 (×7): 1 via ORAL
  Filled 2019-10-10 (×8): qty 1

## 2019-10-10 NOTE — Progress Notes (Signed)
   10/10/19 1616  Assess: MEWS Score  Temp 97.7 F (36.5 C)  BP 106/75  Pulse Rate (!) 109  Resp (!) 32  SpO2 100 %  O2 Device Nasal Cannula  O2 Flow Rate (L/min) 2 L/min  Assess: MEWS Score  MEWS Temp 0  MEWS Systolic 0  MEWS Pulse 1  MEWS RR 2  MEWS LOC 0  MEWS Score 3  MEWS Score Color Yellow  Assess: if the MEWS score is Yellow or Red  Were vital signs taken at a resting state? Yes  Focused Assessment Documented focused assessment  Early Detection of Sepsis Score *See Row Information* Low  MEWS guidelines implemented *See Row Information* Yes  Treat  MEWS Interventions Consulted Respiratory Therapy;Administered scheduled meds/treatments;Escalated (See documentation below)  Take Vital Signs  Increase Vital Sign Frequency  Yellow: Q 2hr X 2 then Q 4hr X 2, if remains yellow, continue Q 4hrs  Escalate  MEWS: Escalate Yellow: discuss with charge nurse/RN and consider discussing with provider and RRT  Notify: Charge Nurse/RN  Name of Charge Nurse/RN Notified Katie R  Date Charge Nurse/RN Notified 10/10/19  Time Charge Nurse/RN Notified 1620  Document  Progress note created (see row info) Yes

## 2019-10-10 NOTE — H&P (Addendum)
History and Physical    Debra Rodgers XTA:[id number] DOB: 04-24-1945 DOA: 10/10/2019  PCP: [name], MD  Patient coming from: Home  Chief Complaint: SOB, wheezing  HPI: Debra Rodgers is a 74 y.o. female with medical history significant of copd, lung cancer currently undergoing chemo, HTN, HLD who presents to the ED with acutely increased sob and wheezing that started overnight on the morning of admission. Pt has been compliant with copd regimen prior to admit. Denies fevers, sick contacts, chest pain  ED Course: In the ED, pt noted to have active wheezing with sob. Pt given trial of IV steroid and neb tx without much improvement. CXR found to be unremarkable. COVID test was neg. Pt required [facility] in ED. Given concerns of COPD exacerbation, hospitalist consulted for consideration for admission.  Review of Systems:  Review of Systems  Constitutional: Negative for chills, fever and malaise/fatigue.  HENT: Negative for congestion, ear pain, nosebleeds and tinnitus.   Eyes: Negative for double vision, photophobia and pain.  Respiratory: Positive for cough, shortness of breath and wheezing.   Cardiovascular: Negative for chest pain, palpitations and claudication.  Gastrointestinal: Negative for abdominal pain, nausea and vomiting.  Genitourinary: Negative for frequency, hematuria and urgency.  Musculoskeletal: Negative for back pain, falls and neck pain.  Neurological: Negative for tremors, sensory change, seizures, loss of consciousness and weakness.  Psychiatric/Behavioral: Negative for hallucinations and memory loss. The patient is not nervous/anxious.     Past Medical History:  Diagnosis Date  . COPD (chronic obstructive pulmonary disease) ([place])   . Depression   . Essential hypertension   . Headache   . History of migraine headaches   . lung ca dx'd 10/2018  . Sleep apnea   . Tobacco use disorder     Past Surgical History:  Procedure Laterality Date  . ABDOMINAL  HYSTERECTOMY    . CHOLECYSTECTOMY    . IR IMAGING GUIDED PORT INSERTION  02/24/2019  . ROTATOR CUFF REPAIR  3/04  . VIDEO BRONCHOSCOPY WITH ENDOBRONCHIAL ULTRASOUND Right 11/25/2018   Procedure: VIDEO BRONCHOSCOPY WITH ENDOBRONCHIAL ULTRASOUND;  Surgeon: Collene Gobble, MD;  Location: [facility];  Service: Thoracic;  Laterality: Right;     reports that she quit smoking about 11 months ago. Her smoking use included cigarettes. She started smoking about 57 years ago. She has a 56.00 pack-year smoking history. She has never used smokeless tobacco. She reports that she does not drink alcohol and does not use drugs.  Allergies  Allergen Reactions  . Codeine Sulfate     REACTION: "makes me high"  . Pantoprazole Sodium Swelling and Rash    facial swelling    Family History  Problem Relation Age of Onset  . Hypertension Mother   . Stroke Mother   . Coronary artery disease Mother   . Heart disease Father   . Diabetes Sister   . Hypertension Sister   . Cancer Sister   . Breast cancer Sister     Prior to Admission medications   Medication Sig Start Date End Date Taking? Authorizing Provider  acetaminophen (TYLENOL) 325 MG tablet Take 2 tablets (650 mg total) by mouth every 6 (six) hours as needed. Patient taking differently: Take 650 mg by mouth every 6 (six) hours as needed for mild pain.  11/05/18  Yes Varney Biles, MD  albuterol (VENTOLIN HFA) 108 (90 Base) MCG/ACT inhaler Inhale 2 puffs into the lungs every 6 (six) hours as needed for wheezing or shortness of breath. 07/23/19  Yes  Collene Gobble, MD  amLODipine (NORVASC) 10 MG tablet Take 1 tablet (10 mg total) by mouth daily. 07/19/19  Yes [name], MD  atorvastatin (LIPITOR) 20 MG tablet Take 1 tablet (20 mg total) by mouth daily. 07/19/19  Yes [name], MD  CALCIUM PO Take 1 tablet by mouth daily.   Yes [provider]  chlorthalidone (HYGROTON) 50 MG tablet Take 1 tablet (50 mg total) by mouth daily.  07/19/19  Yes [name], MD  famotidine (PEPCID) 20 MG tablet Take 20 mg by mouth daily.    Yes [provider]  FLUoxetine (PROZAC) 20 MG capsule Take 1 capsule (20 mg total) by mouth daily. 07/19/19  Yes [name], MD  HYDROcodone-acetaminophen ([place]) 7.5-325 MG tablet Take 1 tablet by mouth every 6 (six) hours as needed for moderate pain. 12/30/18  Yes Heilingoetter, Cassandra L, PA-C  lidocaine-prilocaine (EMLA) cream Apply 1 application topically as needed. Patient taking differently: Apply 1 application topically as needed (port access).  07/28/19  Yes Heilingoetter, Cassandra L, PA-C  lisinopril (ZESTRIL) 10 MG tablet Take 1 tablet (10 mg total) by mouth daily. 07/19/19  Yes [name], MD  polyvinyl alcohol (LIQUIFILM TEARS) 1.4 % ophthalmic solution Place 1 drop into both eyes as needed for dry eyes.   Yes [provider]  Potassium Chloride ER 20 MEQ TBCR TAKE 1 TABLET BY MOUTH EVERY DAY Patient taking differently: Take 20 mEq by mouth daily.  10/04/19  Yes Curt Bears, MD  rivaroxaban (XARELTO) 20 MG TABS tablet Take 1 tablet (20 mg total) by mouth daily with supper. 09/08/19  Yes Curt Bears, MD  STIOLTO RESPIMAT 2.5-2.5 MCG/ACT AERS INHALE 2 PUFFS BY MOUTH INTO THE LUNGS DAILY Patient taking differently: Take 2 puffs by mouth daily.  09/13/19  Yes Collene Gobble, MD  guaiFENesin-dextromethorphan (ROBITUSSIN DM) 100-10 MG/5ML syrup Take 5 mLs by mouth every 4 (four) hours as needed for cough. Patient not taking: Reported on 10/10/2019 08/26/19   [name], MD  methylPREDNISolone (MEDROL) 4 MG tablet Take 1 tablet (4 mg total) by mouth daily. Patient not taking: Reported on 10/10/2019 08/26/19   [name], MD    Physical Exam: Vitals:   10/10/19 1217 10/10/19 1254 10/10/19 1324 10/10/19 1331  BP: 111/70 114/86  97/67  Pulse: 86 85  88  Resp: (!) 27 (!) 26  16  SpO2: 99% 100% 99% 100%    Constitutional: NAD,  calm, comfortable  Vitals:   10/10/19 1217 10/10/19 1254 10/10/19 1324 10/10/19 1331  BP: 111/70 114/86  97/67  Pulse: 86 85  88  Resp: (!) 27 (!) 26  16  SpO2: 99% 100% 99% 100%   Eyes: PERRL, lids and conjunctivae normal ENMT: Mucous membranes are moist. Posterior pharynx clear of any exudate or lesions.Normal dentition.  Neck: normal, supple, no masses Respiratory: end-expiratory wheezing, coarse, increased resp effort Cardiovascular: Regular rate and rhythm, s1, s2 Abdomen: no tenderness, no masses palpated. Pos bs Musculoskeletal: no clubbing / cyanosis. No joint deformity upper and lower extremities. Good ROM, no contractures. Normal muscle tone.  Skin: no rashes, lesions, ulcers. No induration Neurologic: CN 2-12 grossly intact. Sensation intact. Strength 5/5 in all 4.  Psychiatric: Normal judgment and insight. Alert and oriented x 3. Normal mood.    Labs on Admission: I have personally reviewed following labs and imaging studies  CBC: Recent Labs  Lab 10/10/19 1151  WBC 5.3  NEUTROABS 2.2  HGB 12.7  HCT 38.0  MCV 100.5*  PLT 263   Basic Metabolic Panel: Recent Labs  Lab 10/10/19 1151  NA 139  K 4.1  CL 98  CO2 29  GLUCOSE 85  BUN 23  CREATININE 0.96  CALCIUM 9.8   GFR: Estimated Creatinine Clearance: 45.7 mL/min (by C-G formula based on SCr of 0.96 mg/dL). Liver Function Tests: No results for input(s): AST, ALT, ALKPHOS, BILITOT, PROT, ALBUMIN in the last 168 hours. No results for input(s): LIPASE, AMYLASE in the last 168 hours. No results for input(s): AMMONIA in the last 168 hours. Coagulation Profile: No results for input(s): INR, PROTIME in the last 168 hours. Cardiac Enzymes: No results for input(s): CKTOTAL, CKMB, CKMBINDEX, TROPONINI in the last 168 hours. BNP (last 3 results) No results for input(s): PROBNP in the last 8760 hours. HbA1C: No results for input(s): HGBA1C in the last 72 hours. CBG: No results for input(s): GLUCAP in the last  168 hours. Lipid Profile: No results for input(s): CHOL, HDL, LDLCALC, TRIG, CHOLHDL, LDLDIRECT in the last 72 hours. Thyroid Function Tests: No results for input(s): TSH, T4TOTAL, FREET4, T3FREE, THYROIDAB in the last 72 hours. Anemia Panel: No results for input(s): VITAMINB12, FOLATE, FERRITIN, TIBC, IRON, RETICCTPCT in the last 72 hours. Urine analysis:    Component Value Date/Time   COLORURINE YELLOW 11/04/2018 0011   APPEARANCEUR CLEAR 11/04/2018 0011   LABSPEC 1.017 11/04/2018 0011   PHURINE 5.0 11/04/2018 0011   GLUCOSEU NEGATIVE 11/04/2018 0011   HGBUR NEGATIVE 11/04/2018 0011   BILIRUBINUR NEGATIVE 11/04/2018 0011   BILIRUBINUR NEGATIVE 09/22/2018 1642   KETONESUR NEGATIVE 11/04/2018 0011   PROTEINUR NEGATIVE 11/04/2018 0011   UROBILINOGEN 2.0 (A) 09/22/2018 1642   UROBILINOGEN 0.2 07/08/2013 1642   NITRITE NEGATIVE 11/04/2018 0011   LEUKOCYTESUR NEGATIVE 11/04/2018 0011   Sepsis Labs: !!!!!!!!!!!!!!!!!!!!!!!!!!!!!!!!!!!!!!!!!!!! @LABRCNTIP (procalcitonin:4,lacticidven:4) ) Recent Results (from the past 240 hour(s))  SARS Coronavirus 2 by RT PCR (hospital order, performed in [facility] [place] hospital lab) Nasopharyngeal Nasopharyngeal Swab     Status: None   Collection Time: 10/10/19 11:51 AM   Specimen: Nasopharyngeal Swab  Result Value Ref Range Status   SARS Coronavirus 2 NEGATIVE NEGATIVE Final    Comment: (NOTE) SARS-CoV-2 target nucleic acids are NOT DETECTED.  The SARS-CoV-2 RNA is generally detectable in upper and lower respiratory specimens during the acute phase of infection. The lowest concentration of SARS-CoV-2 viral copies this assay can detect is 250 copies / mL. A negative result does not preclude SARS-CoV-2 infection and should not be used as the sole basis for treatment or other patient management decisions.  A negative result may occur with improper specimen collection / handling, submission of specimen other than nasopharyngeal swab, presence of  viral mutation(s) within the areas targeted by this assay, and inadequate number of viral copies (<250 copies / mL). A negative result must be combined with clinical observations, patient history, and epidemiological information.  Fact Sheet for Patients:   StrictlyIdeas.no  Fact Sheet for Healthcare Providers: BankingDealers.co.za  This test is not yet approved or  cleared by the [place] FDA and has been authorized for detection and/or diagnosis of SARS-CoV-2 by FDA under an Emergency Use Authorization (EUA).  This EUA will remain in effect (meaning this test can be used) for the duration of the COVID-19 declaration under Section 564(b)(1) of the Act, 21 U.S.C. section 360bbb-3(b)(1), unless the authorization is terminated or revoked sooner.  Performed at [facility], [place] [address], [place], [place] [address]  Radiological Exams on Admission: DG Chest 2 View  Result Date: 10/10/2019 CLINICAL DATA:  Shortness of breath and COPD. EXAM: CHEST - 2 VIEW COMPARISON:  Aug 23, 2019 FINDINGS: Injectable port in stable position. Mildly enlarged cardiac silhouette. Aneurysmal dilation of the aorta better demonstrated on recent chest CT. Mild coarsening of the interstitial markings. No focal airspace consolidation. Chronic elevation of the left hemidiaphragm. Osseous structures are without acute abnormality. Soft tissues are grossly normal. IMPRESSION: 1. Mild coarsening of the interstitial markings. 2. No focal airspace consolidation. 3. Aneurysmal dilation of the aorta better demonstrated on recent chest CT. Electronically Signed   By: Fidela Salisbury M.D.   On: 10/10/2019 12:45    EKG: Independently reviewed. Sinus  Assessment/Plan Principal Problem:   COPD exacerbation (HCC) Active Problems:   Hyperlipidemia   Tobacco use   Essential hypertension   OSA (obstructive sleep apnea)   Stage IV squamous cell  carcinoma of right lung (HCC)   DVT (deep venous thrombosis) ([place])  1. COPD exacerbation 1. Trial of neb tx and steroids given in ED without significant improvement 2. Pt still with increased sob and wheezing 3. Currently on 2LNC, baseline is O2 naive 4. COVID test is neg 5. Will continue scheduled dunebs with IV solumedrol q12hrs 6. Admit to med-surg 2. HLD 1. Cont on statin 2. Seems stable at this time 3. Prior hx of tobacco abuse 1. Quit smoking one year ago. Pt congratulated 4. HTN 1. BP stable at this time 2. Cont home meds as tolerated 5. Stage 4 squamous cell lung cancer 1. Followed by Dr. Julien Nordmann, last seen in office on 09/29/19 2. Currently undergoing maintenance chemo, recently received cycle #11 of Keytruda on 7/7 6. DVT 1. Continue xarelto as tolerated 2. Stable  DVT prophylaxis: Xarelto  Code Status: Pt and family unsure and will discuss amongst themselves. Presumptive FULL CODE for now. Would re-address Family Communication: Pt in room  Disposition Plan: Uncertain at this time  Consults called:  Admission status: Inpatient as pt would likely require greater than 2 midnight stay for scheduled breathing tx and IV steroids for acute copd exacerbation   Marylu Lund MD [facility] Pager On Amion  If 7PM-7AM, please contact night-coverage  10/10/2019, 1:49 PM

## 2019-10-10 NOTE — Progress Notes (Signed)
Unable to reach respiratory after calling three times. Patient transferred to 1406. Resp remain 32. Report called to [facility] and informed her of not being able to reach respiratory. Patient on O2 @2L  via [place]. Transferred via wheelchair.Husband at bedside.

## 2019-10-10 NOTE — ED Triage Notes (Signed)
Patient coming from home with complain of shortness of breath and expiratory wheezes . Patient oxygen sat upon arrival was 84 % on room air. Patient have hx of lung cancer currently on treatment. Last treatment 09/29/19

## 2019-10-10 NOTE — Progress Notes (Signed)
RT placed CPAP on @ this time, toleration well.

## 2019-10-10 NOTE — ED Notes (Signed)
ED TO INPATIENT HANDOFF REPORT  ED Nurse Name and Phone #: [phone]  S Name/Age/Gender Debra Rodgers 74 y.o. female Room/Bed: WA18/WA18  Code Status   Code Status: Prior  Home/SNF/Other Home Patient oriented to: self, place, time and situation Is this baseline? Yes   Triage Complete: Triage complete  Chief Complaint COPD exacerbation [facility]) [J44.1]  Triage Note Patient coming from home with complain of shortness of breath and expiratory wheezes . Patient oxygen sat upon arrival was 84 % on room air. Patient have hx of lung cancer currently on treatment. Last treatment 09/29/19    Allergies Allergies  Allergen Reactions  . Codeine Sulfate     REACTION: "makes me high"  . Pantoprazole Sodium Swelling and Rash    facial swelling    Level of Care/Admitting Diagnosis ED Disposition    ED Disposition Condition Comment   Admit  Hospital Area: [facility] [[id number]]  Level of Care: Med-Surg [16]  May admit patient to [facility] or [facility] if equivalent level of care is available:: Yes  Covid Evaluation: Confirmed COVID Negative  Diagnosis: COPD exacerbation [facility]) [[id number]]  Admitting Physician: Velta Addison  Attending Physician: Donne Hazel [[id number]]  Estimated length of stay: 3 - 4 days  Certification:: I certify this patient will need inpatient services for at least 2 midnights       B Medical/Surgery History Past Medical History:  Diagnosis Date  . COPD (chronic obstructive pulmonary disease) ([place])   . Depression   . Essential hypertension   . Headache   . History of migraine headaches   . lung ca dx'd 10/2018  . Sleep apnea   . Tobacco use disorder    Past Surgical History:  Procedure Laterality Date  . ABDOMINAL HYSTERECTOMY    . CHOLECYSTECTOMY    . IR IMAGING GUIDED PORT INSERTION  02/24/2019  . ROTATOR CUFF REPAIR  3/04  . VIDEO BRONCHOSCOPY WITH ENDOBRONCHIAL ULTRASOUND Right 11/25/2018   Procedure: VIDEO BRONCHOSCOPY  WITH ENDOBRONCHIAL ULTRASOUND;  Surgeon: Collene Gobble, MD;  Location: [facility];  Service: Thoracic;  Laterality: Right;     A IV Location/Drains/Wounds Patient Lines/Drains/Airways Status    Active Line/Drains/Airways    Name Placement date Placement time Site Days   Implanted Port 02/24/19 Right Chest 02/24/19  1459  Chest  228          Intake/Output Last 24 hours No intake or output data in the 24 hours ending 10/10/19 1422  Labs/Imaging Results for orders placed or performed during the hospital encounter of 10/10/19 (from the past 48 hour(s))  Basic metabolic panel     Status: Abnormal   Collection Time: 10/10/19 11:51 AM  Result Value Ref Range   Sodium 139 135 - 145 mmol/L   Potassium 4.1 3.5 - 5.1 mmol/L   Chloride 98 98 - 111 mmol/L   CO2 29 22 - 32 mmol/L   Glucose, Bld 85 70 - 99 mg/dL    Comment: Glucose reference range applies only to samples taken after fasting for at least 8 hours.   BUN 23 8 - 23 mg/dL   Creatinine, Ser 0.96 0.44 - 1.00 mg/dL   Calcium 9.8 8.9 - 10.3 mg/dL   GFR calc non Af Amer 59 (L) >60 mL/min   GFR calc Af Amer >60 >60 mL/min   Anion gap 12 5 - 15    Comment: Performed at [facility], [place] [address] [place], [place] [address]  Brain  natriuretic peptide     Status: None   Collection Time: 10/10/19 11:51 AM  Result Value Ref Range   B Natriuretic Peptide 20.5 0.0 - 100.0 pg/mL    Comment: Performed at [facility], [place] [address] [place], [place] [address]  CBC with Differential     Status: Abnormal   Collection Time: 10/10/19 11:51 AM  Result Value Ref Range   WBC 5.3 4.0 - 10.5 K/uL   RBC 3.78 (L) 3.87 - 5.11 MIL/uL   Hemoglobin 12.7 12.0 - 15.0 g/dL   HCT 38.0 36 - 46 %   MCV 100.5 (H) 80.0 - 100.0 fL   MCH 33.6 26.0 - 34.0 pg   MCHC 33.4 30.0 - 36.0 g/dL   RDW 13.2 11.5 - 15.5 %   Platelets 229 150 - 400 K/uL   nRBC 0.0 0.0 - 0.2 %   Neutrophils Relative % 42 %   Neutro Abs 2.2 1.7 -  7.7 K/uL   Lymphocytes Relative 23 %   Lymphs Abs 1.2 0.7 - 4.0 K/uL   Monocytes Relative 12 %   Monocytes Absolute 0.6 0 - 1 K/uL   Eosinophils Relative 22 %   Eosinophils Absolute 1.2 (H) 0 - 0 K/uL   Basophils Relative 1 %   Basophils Absolute 0.1 0 - 0 K/uL   Immature Granulocytes 0 %   Abs Immature Granulocytes 0.01 0.00 - 0.07 K/uL    Comment: Performed at [facility], [place] [address] [place], [place] [address]  SARS Coronavirus 2 by RT PCR (hospital order, performed in [facility] hospital lab) Nasopharyngeal Nasopharyngeal Swab     Status: None   Collection Time: 10/10/19 11:51 AM   Specimen: Nasopharyngeal Swab  Result Value Ref Range   SARS Coronavirus 2 NEGATIVE NEGATIVE    Comment: (NOTE) SARS-CoV-2 target nucleic acids are NOT DETECTED.  The SARS-CoV-2 RNA is generally detectable in upper and lower respiratory specimens during the acute phase of infection. The lowest concentration of SARS-CoV-2 viral copies this assay can detect is 250 copies / mL. A negative result does not preclude SARS-CoV-2 infection and should not be used as the sole basis for treatment or other patient management decisions.  A negative result may occur with improper specimen collection / handling, submission of specimen other than nasopharyngeal swab, presence of viral mutation(s) within the areas targeted by this assay, and inadequate number of viral copies (<250 copies / mL). A negative result must be combined with clinical observations, patient history, and epidemiological information.  Fact Sheet for Patients:   StrictlyIdeas.no  Fact Sheet for Healthcare Providers: BankingDealers.co.za  This test is not yet approved or  cleared by the [place] FDA and has been authorized for detection and/or diagnosis of SARS-CoV-2 by FDA under an Emergency Use Authorization (EUA).  This EUA will remain in effect (meaning this test  can be used) for the duration of the COVID-19 declaration under Section 564(b)(1) of the Act, 21 U.S.C. section 360bbb-3(b)(1), unless the authorization is terminated or revoked sooner.  Performed at [facility], [place] [address] [place] [address] [place], [place] [address]    DG Chest 2 View  Result Date: 10/10/2019 CLINICAL DATA:  Shortness of breath and COPD. EXAM: CHEST - 2 VIEW COMPARISON:  Aug 23, 2019 FINDINGS: Injectable port in stable position. Mildly enlarged cardiac silhouette. Aneurysmal dilation of the aorta better demonstrated on recent chest CT. Mild coarsening of the interstitial markings. No focal airspace consolidation. Chronic elevation of the  left hemidiaphragm. Osseous structures are without acute abnormality. Soft tissues are grossly normal. IMPRESSION: 1. Mild coarsening of the interstitial markings. 2. No focal airspace consolidation. 3. Aneurysmal dilation of the aorta better demonstrated on recent chest CT. Electronically Signed   By: Fidela Salisbury M.D.   On: 10/10/2019 12:45    Pending Labs Unresulted Labs (From admission, onward) Comment          Start     Ordered   Pending  Comprehensive metabolic panel  Tomorrow morning,   R        Pending   Pending  CBC  Tomorrow morning,   R        Pending          Vitals/Pain Today's Vitals   10/10/19 1254 10/10/19 1324 10/10/19 1331 10/10/19 1409  BP: 114/86  97/67 (!) 150/96  Pulse: 85  88 (!) 108  Resp: (!) 26  16 15   SpO2: 100% 99% 100% 97%  PainSc:        Isolation Precautions No active isolations  Medications Medications  albuterol (PROVENTIL,VENTOLIN) solution continuous neb (5 mg/hr Nebulization New Bag/Given 10/10/19 1324)  ipratropium-albuterol (DUONEB) 0.5-2.5 (3) MG/3ML nebulizer solution 3 mL (3 mLs Nebulization Given 10/10/19 1325)  methylPREDNISolone sodium succinate (SOLU-MEDROL) 125 mg/2 mL injection 125 mg (125 mg Intravenous Given 10/10/19 1140)  magnesium sulfate IVPB 2 g 50 mL (0  g Intravenous Stopped 10/10/19 1246)    Mobility walks Low fall risk   Focused Assessments .   R Recommendations: See Admitting Provider Note  Report given to:   Additional Notes: n/a

## 2019-10-10 NOTE — ED Provider Notes (Signed)
[facility] DEPT Provider Note   CSN: [id number] Arrival date & time: 10/10/19  1108     History No chief complaint on file.   Debra Rodgers is a 74 y.o. female.  Patient is a 74 year old female with past medical history of COPD not on home oxygen, right-sided lung cancer, hypertension, migraine headaches presenting to the emergency department for shortness of breath.  She reports that she woke up at 2 AM from her sleep feeling short of breath.  Reports significant wheezing.  Reports she has been taking her regular COPD medications.  She denies any chest pain, fever, sick contacts, leg swelling.        Past Medical History:  Diagnosis Date  . COPD (chronic obstructive pulmonary disease) ([facility])   . Depression   . Essential hypertension   . Headache   . History of migraine headaches   . lung ca dx'd 10/2018  . Sleep apnea   . Tobacco use disorder     Patient Active Problem List   Diagnosis Date Noted  . Acute exacerbation of chronic obstructive pulmonary disease (COPD) ([place]) 08/24/2019  . COPD exacerbation ([place]) 08/23/2019  . DVT (deep venous thrombosis) ([place]) 07/12/2019  . Port-A-Cath in place 07/07/2019  . Cancer associated pain 12/30/2018  . Constipation 12/16/2018  . Stage IV squamous cell carcinoma of right lung ([place]) 12/02/2018  . Goals of care, counseling/discussion 12/02/2018  . Encounter for antineoplastic chemotherapy 12/02/2018  . Encounter for antineoplastic immunotherapy 12/02/2018  . Abdominal aortic aneurysm (AAA) 35 to 39 mm in diameter ([place]) 11/05/2018  . BPPV (benign paroxysmal positional vertigo) 12/02/2016  . COPD (chronic obstructive pulmonary disease) ([place]) 09/23/2016  . OSA (obstructive sleep apnea) 10/02/2015  . Glucose intolerance 10/02/2015  . Adenomatous colon polyp 10/02/2015  . Tobacco use 04/17/2015  . Essential hypertension 04/17/2015  . Major depressive disorder, recurrent episode ([place]) 04/17/2015  .  Osteoarthritis cervical spine 05/26/2013  . GERD 06/25/2007  . Hyperlipidemia 07/29/2006    Past Surgical History:  Procedure Laterality Date  . ABDOMINAL HYSTERECTOMY    . CHOLECYSTECTOMY    . IR IMAGING GUIDED PORT INSERTION  02/24/2019  . ROTATOR CUFF REPAIR  3/04  . VIDEO BRONCHOSCOPY WITH ENDOBRONCHIAL ULTRASOUND Right 11/25/2018   Procedure: VIDEO BRONCHOSCOPY WITH ENDOBRONCHIAL ULTRASOUND;  Surgeon: Collene Gobble, MD;  Location: [facility];  Service: Thoracic;  Laterality: Right;     OB History   No obstetric history on file.     Family History  Problem Relation Age of Onset  . Hypertension Mother   . Stroke Mother   . Coronary artery disease Mother   . Heart disease Father   . Diabetes Sister   . Hypertension Sister   . Cancer Sister   . Breast cancer Sister     Social History   Tobacco Use  . Smoking status: Former Smoker    Packs/day: 1.00    Years: 56.00    Pack years: 56.00    Types: Cigarettes    Start date: 1964    Quit date: 11/04/2018    Years since quitting: 0.9  . Smokeless tobacco: Never Used  Substance Use Topics  . Alcohol use: No    Alcohol/week: 0.0 standard drinks  . Drug use: No    Home Medications Prior to Admission medications   Medication Sig Start Date End Date Taking? Authorizing Provider  acetaminophen (TYLENOL) 325 MG tablet Take 2 tablets (650 mg total) by mouth every 6 (six) hours as needed.  Patient taking differently: Take 650 mg by mouth every 6 (six) hours as needed for mild pain.  11/05/18  Yes Varney Biles, MD  albuterol (VENTOLIN HFA) 108 (90 Base) MCG/ACT inhaler Inhale 2 puffs into the lungs every 6 (six) hours as needed for wheezing or shortness of breath. 07/23/19  Yes Collene Gobble, MD  amLODipine (NORVASC) 10 MG tablet Take 1 tablet (10 mg total) by mouth daily. 07/19/19  Yes [name], MD  atorvastatin (LIPITOR) 20 MG tablet Take 1 tablet (20 mg total) by mouth daily. 07/19/19  Yes [name],  MD  CALCIUM PO Take 1 tablet by mouth daily.   Yes [provider]  chlorthalidone (HYGROTON) 50 MG tablet Take 1 tablet (50 mg total) by mouth daily. 07/19/19  Yes [name], MD  famotidine (PEPCID) 20 MG tablet Take 20 mg by mouth daily.    Yes [provider]  FLUoxetine (PROZAC) 20 MG capsule Take 1 capsule (20 mg total) by mouth daily. 07/19/19  Yes [name], MD  HYDROcodone-acetaminophen ([place]) 7.5-325 MG tablet Take 1 tablet by mouth every 6 (six) hours as needed for moderate pain. 12/30/18  Yes Heilingoetter, Cassandra L, PA-C  lidocaine-prilocaine (EMLA) cream Apply 1 application topically as needed. Patient taking differently: Apply 1 application topically as needed (port access).  07/28/19  Yes Heilingoetter, Cassandra L, PA-C  lisinopril (ZESTRIL) 10 MG tablet Take 1 tablet (10 mg total) by mouth daily. 07/19/19  Yes [name], MD  polyvinyl alcohol (LIQUIFILM TEARS) 1.4 % ophthalmic solution Place 1 drop into both eyes as needed for dry eyes.   Yes [provider]  Potassium Chloride ER 20 MEQ TBCR TAKE 1 TABLET BY MOUTH EVERY DAY Patient taking differently: Take 20 mEq by mouth daily.  10/04/19  Yes Curt Bears, MD  rivaroxaban (XARELTO) 20 MG TABS tablet Take 1 tablet (20 mg total) by mouth daily with supper. 09/08/19  Yes Curt Bears, MD  STIOLTO RESPIMAT 2.5-2.5 MCG/ACT AERS INHALE 2 PUFFS BY MOUTH INTO THE LUNGS DAILY Patient taking differently: Take 2 puffs by mouth daily.  09/13/19  Yes Collene Gobble, MD  guaiFENesin-dextromethorphan (ROBITUSSIN DM) 100-10 MG/5ML syrup Take 5 mLs by mouth every 4 (four) hours as needed for cough. Patient not taking: Reported on 10/10/2019 08/26/19   [name], MD  methylPREDNISolone (MEDROL) 4 MG tablet Take 1 tablet (4 mg total) by mouth daily. Patient not taking: Reported on 10/10/2019 08/26/19   [name], MD    Allergies    Codeine sulfate and Pantoprazole  sodium  Review of Systems   Review of Systems  Constitutional: Negative for appetite change and fever.  HENT: Negative.   Respiratory: Positive for cough, shortness of breath and wheezing.   Cardiovascular: Negative.   Gastrointestinal: Negative.   Genitourinary: Negative for dysuria.  Musculoskeletal: Negative.   Neurological: Negative.   All other systems reviewed and are negative.   Physical Exam Updated Vital Signs BP 114/86   Pulse 85   Resp (!) 26   SpO2 100%   Physical Exam Vitals and nursing note reviewed.  Constitutional:      General: She is in acute distress.     Appearance: Normal appearance. She is not ill-appearing, toxic-appearing or diaphoretic.  HENT:     Head: Normocephalic.  Eyes:     Conjunctiva/sclera: Conjunctivae normal.  Cardiovascular:     Rate and Rhythm: Regular rhythm. Tachycardia present.  Pulmonary:  Effort: Tachypnea and prolonged expiration present.     Breath sounds: Wheezing and rhonchi present.     Comments: Speaking in 4-5 word sentences Skin:    General: Skin is dry.  Neurological:     Mental Status: She is alert.  Psychiatric:        Mood and Affect: Mood normal.     ED Results / Procedures / Treatments   Labs (all labs ordered are listed, but only abnormal results are displayed) Labs Reviewed  BASIC METABOLIC PANEL - Abnormal; Notable for the following components:      Result Value   GFR calc non Af Amer 59 (*)    All other components within normal limits  CBC WITH DIFFERENTIAL/PLATELET - Abnormal; Notable for the following components:   RBC 3.78 (*)    MCV 100.5 (*)    Eosinophils Absolute 1.2 (*)    All other components within normal limits  SARS CORONAVIRUS 2 BY RT PCR (HOSPITAL ORDER, [facility])  BRAIN NATRIURETIC PEPTIDE    EKG EKG Interpretation  Date/Time:  Sunday October 10 2019 11:31:14 EDT Ventricular Rate:  92 PR Interval:    QRS Duration: 89 QT Interval:  401 QTC  Calculation: 497 R Axis:   61 Text Interpretation: Sinus rhythm Probable left atrial enlargement RSR' in V1 or V2, probably normal variant Borderline prolonged QT interval Artifact Abnormal ECG Confirmed by Carmin Muskrat [id number]) on 10/10/2019 11:39:24 AM   Radiology DG Chest 2 View  Result Date: 10/10/2019 CLINICAL DATA:  Shortness of breath and COPD. EXAM: CHEST - 2 VIEW COMPARISON:  Aug 23, 2019 FINDINGS: Injectable port in stable position. Mildly enlarged cardiac silhouette. Aneurysmal dilation of the aorta better demonstrated on recent chest CT. Mild coarsening of the interstitial markings. No focal airspace consolidation. Chronic elevation of the left hemidiaphragm. Osseous structures are without acute abnormality. Soft tissues are grossly normal. IMPRESSION: 1. Mild coarsening of the interstitial markings. 2. No focal airspace consolidation. 3. Aneurysmal dilation of the aorta better demonstrated on recent chest CT. Electronically Signed   By: Fidela Salisbury M.D.   On: 10/10/2019 12:45    Procedures Procedures (including critical care time)  Medications Ordered in ED Medications  albuterol (PROVENTIL,VENTOLIN) solution continuous neb (5 mg/hr Nebulization New Bag/Given 10/10/19 1324)  ipratropium-albuterol (DUONEB) 0.5-2.5 (3) MG/3ML nebulizer solution 3 mL (3 mLs Nebulization Given 10/10/19 1325)  methylPREDNISolone sodium succinate (SOLU-MEDROL) 125 mg/2 mL injection 125 mg (125 mg Intravenous Given 10/10/19 1140)  magnesium sulfate IVPB 2 g 50 mL (2 g Intravenous New Bag/Given 10/10/19 1146)    ED Course  I have reviewed the triage vital signs and the nursing notes.  Pertinent labs & imaging results that were available during my care of the patient were reviewed by me and considered in my medical decision making (see chart for details).  Clinical Course as of Oct 09 1329  Sun Oct 10, 2019  1131 74 y/o with COPD and active lung CA presenting for SOB, wheezing. Oxygen 84% on RA  on arrival, not on home oxygen. Appears extremely wheezy but alert and oriented on arrival. Steroids, magnesium, duonebs ordered. Labs, chest xray pending   [KM]  1222 Patient slowly responding to treatment but still working to breathe and desat without oxygen. Awaiting chest xray and covid test, otherwise normal labs. Patient had recent CTA negative for PE. Likely this is COPD exacerbation   [KM]  51 Spoke with Dr. Wyline Copas who will admit patient for COPD exacerbation.    [  KM]    Clinical Course User Index [KM] Kristine Royal   MDM Rules/Calculators/A&P                          CRITICAL CARE Performed by: Alveria Apley   Total critical care time: 35 minutes  Critical care time was exclusive of separately billable procedures and treating other patients.  Critical care was necessary to treat or prevent imminent or life-threatening deterioration.  Critical care was time spent personally by me on the following activities: development of treatment plan with patient and/or surrogate as well as nursing, discussions with consultants, evaluation of patient's response to treatment, examination of patient, obtaining history from patient or surrogate, ordering and performing treatments and interventions, ordering and review of laboratory studies, ordering and review of radiographic studies, pulse oximetry and re-evaluation of patient's condition.  Final Clinical Impression(s) / ED Diagnoses Final diagnoses:  Chronic obstructive pulmonary disease, unspecified COPD type [facility])  Respiratory distress    Rx / DC Orders ED Discharge Orders    None       Kristine Royal 10/10/19 1331    Carmin Muskrat, MD 10/11/19 [phone]

## 2019-10-11 DIAGNOSIS — J441 Chronic obstructive pulmonary disease with (acute) exacerbation: Principal | ICD-10-CM

## 2019-10-11 LAB — CBC
HCT: 35.2 % — ABNORMAL LOW (ref 36.0–46.0)
Hemoglobin: 11.6 g/dL — ABNORMAL LOW (ref 12.0–15.0)
MCH: 33 pg (ref 26.0–34.0)
MCHC: 33 g/dL (ref 30.0–36.0)
MCV: 100 fL (ref 80.0–100.0)
Platelets: 230 10*3/uL (ref 150–400)
RBC: 3.52 MIL/uL — ABNORMAL LOW (ref 3.87–5.11)
RDW: 13.2 % (ref 11.5–15.5)
WBC: 11.9 10*3/uL — ABNORMAL HIGH (ref 4.0–10.5)
nRBC: 0 % (ref 0.0–0.2)

## 2019-10-11 LAB — RESPIRATORY PANEL BY PCR

## 2019-10-11 LAB — COMPREHENSIVE METABOLIC PANEL
ALT: 17 U/L (ref 0–44)
AST: 24 U/L (ref 15–41)
Albumin: 4.4 g/dL (ref 3.5–5.0)
Alkaline Phosphatase: 69 U/L (ref 38–126)
Anion gap: 12 (ref 5–15)
BUN: 32 mg/dL — ABNORMAL HIGH (ref 8–23)
CO2: 28 mmol/L (ref 22–32)
Calcium: 9.7 mg/dL (ref 8.9–10.3)
Chloride: 96 mmol/L — ABNORMAL LOW (ref 98–111)
Creatinine, Ser: 1.02 mg/dL — ABNORMAL HIGH (ref 0.44–1.00)
GFR calc Af Amer: >60 mL/min (ref >60)
GFR calc non Af Amer: 55 mL/min — ABNORMAL LOW (ref >60)
Glucose, Bld: 180 mg/dL — ABNORMAL HIGH (ref 70–99)
Potassium: 3.8 mmol/L (ref 3.5–5.1)
Sodium: 136 mmol/L (ref 135–145)
Total Bilirubin: 0.7 mg/dL (ref 0.3–1.2)
Total Protein: 8.2 g/dL — ABNORMAL HIGH (ref 6.5–8.1)

## 2019-10-11 MED ORDER — HYDROCOD POLST-CPM POLST ER 10-8 MG/5ML PO SUER
5.0000 mL | Freq: Two times a day (BID) | ORAL | Status: DC | PRN
  Administered 2019-10-11 – 2019-10-12 (×2): 5 mL via ORAL
  Filled 2019-10-11 (×2): qty 5

## 2019-10-11 MED ORDER — CHLORHEXIDINE GLUCONATE CLOTH 2 % EX PADS
6.0000 | MEDICATED_PAD | Freq: Every day | CUTANEOUS | Status: DC
  Administered 2019-10-11 – 2019-10-17 (×7): 6 via TOPICAL

## 2019-10-11 MED ORDER — ARFORMOTEROL TARTRATE 15 MCG/2ML IN NEBU
15.0000 ug | INHALATION_SOLUTION | Freq: Two times a day (BID) | RESPIRATORY_TRACT | Status: DC
  Administered 2019-10-11 – 2019-10-17 (×12): 15 ug via RESPIRATORY_TRACT
  Filled 2019-10-11 (×12): qty 2

## 2019-10-11 MED ORDER — ALBUTEROL SULFATE (2.5 MG/3ML) 0.083% IN NEBU
2.5000 mg | INHALATION_SOLUTION | RESPIRATORY_TRACT | Status: DC | PRN
  Administered 2019-10-11 – 2019-10-16 (×3): 2.5 mg via RESPIRATORY_TRACT
  Filled 2019-10-11 (×3): qty 3

## 2019-10-11 MED ORDER — FLUTICASONE PROPIONATE 50 MCG/ACT NA SUSP
1.0000 | Freq: Every day | NASAL | Status: DC
  Administered 2019-10-11 – 2019-10-17 (×7): 1 via NASAL
  Filled 2019-10-11: qty 16

## 2019-10-11 MED ORDER — BUDESONIDE 0.5 MG/2ML IN SUSP
0.5000 mg | Freq: Two times a day (BID) | RESPIRATORY_TRACT | Status: DC
  Administered 2019-10-11 – 2019-10-17 (×12): 0.5 mg via RESPIRATORY_TRACT
  Filled 2019-10-11 (×12): qty 2

## 2019-10-11 MED ORDER — REVEFENACIN 175 MCG/3ML IN SOLN
175.0000 ug | Freq: Every day | RESPIRATORY_TRACT | Status: DC
  Administered 2019-10-11 – 2019-10-17 (×7): 175 ug via RESPIRATORY_TRACT
  Filled 2019-10-11 (×9): qty 3

## 2019-10-11 MED ORDER — SODIUM CHLORIDE 0.9% FLUSH: 10.0000 mL | INTRAVENOUS | Status: DC | PRN

## 2019-10-11 MED ORDER — SODIUM CHLORIDE 0.9% FLUSH
10.0000 mL | Freq: Two times a day (BID) | INTRAVENOUS | Status: DC
  Administered 2019-10-16 – 2019-10-17 (×2): 10 mL

## 2019-10-11 NOTE — Plan of Care (Signed)
  Problem: Coping: Goal: Level of anxiety will decrease Outcome: Progressing   Problem: Activity: Goal: Risk for activity intolerance will decrease Outcome: Not Progressing   Problem: Education: Goal: Knowledge of General Education information will improve Description: Including pain rating scale, medication(s)/side effects and non-pharmacologic comfort measures Outcome: Completed/Met

## 2019-10-11 NOTE — Progress Notes (Addendum)
PROGRESS NOTE    Bernarda Erck  FIE:[id number] DOB: 12-14-1945 DOA: 10/10/2019 PCP: [name], MD   Brief Narrative:  Debra Rodgers is a 74 y.o. female with medical history significant of copd, lung cancer currently undergoing chemo, HTN, HLD who presents to the ED with acutely increased sob and wheezing that started overnight on the morning of admission. Pt has been compliant with copd regimen prior to admit. Denies fevers, sick contacts, chest pain. In the ED, pt noted to have active wheezing with sob. Pt given trial of IV steroid and neb tx without much improvement. CXR found to be unremarkable. COVID test was neg. Pt required [facility] in ED. Given concerns of COPD exacerbation, hospitalist consulted for consideration for admission.   Assessment & Plan:   Principal Problem:   COPD exacerbation ([place]) Active Problems:   Hyperlipidemia   Tobacco use   Essential hypertension   OSA (obstructive sleep apnea)   Stage IV squamous cell carcinoma of right lung (HCC)   DVT (deep venous thrombosis) (HCC)   Acute hypoxic respiratory failure in setting of acute COPD exacerbation, unknown remitting factor.  POA.   - Patient remains on oxygen well above baseline, typically room air at home -Requested pulmonology to evaluate given patient has remained compliant with home medications per her history with generally worsening respiratory status over the past few weeks daily over the past 72 hours -Continue supportive care, oxygen, duo nebs, steroids; cough suppressants with Tussionex and Tessalon Perles -COVID test is neg  Leukemoid reaction in the setting of steroids  - Likely secondary to above - Remains afebrile  Hyperlipidemia - Cont home atorvastatin  Prior hx of tobacco abuse - Quit smoking one year ago  Essential hypertension - Continue home medications amlodipine -we will discontinue lisinopril per discussion with pulmonology given patient's audible wheeze may be upper  airway - Expect some elevation from baseline while acutely ill and hypoxic as above   Stage 4 squamous cell lung cancer - Followed by Dr. Julien Nordmann, last seen in office on 09/29/19 - Currently undergoing maintenance chemo, recently received cycle #11 of Keytruda on 7/7 - Unlikely related to acute respiratory status as above given unremarkable imaging  DVT, POA - Continue home xarelto   DVT prophylaxis: Xarelto  Code Status: Full code Family Communication: None present  Status is: Inpatient  Dispo: The patient is from: Home              Anticipated d/c is to: Home              Anticipated d/c date is: 48 to 72 hours pending clinical course              Patient currently not medically stable for discharge due to ongoing need for supplemental oxygen well above baseline, around-the-clock nebulizers, IV steroids and close monitoring with pulmonology and telemetry.  Consultants:   Pulmonology  Procedures:   None planned  Antimicrobials:  None indicated  Subjective: No acute issues or events overnight, patient continues to have marked wheezing this morning and cough but states she feels markedly improved from admission.  She is currently tolerating breakfast without difficulty denies nausea, vomiting, diarrhea, constipation, headache, fevers, chills.  Objective: Vitals:   10/10/19 2145 10/11/19 0139 10/11/19 0150 10/11/19 0631  BP: 105/71  (!) 97/56 117/79  Pulse: (!) 102  88 90  Resp: (!) 32  20 (!) 22  Temp: 98.2 F (36.8 C)  98.1 F (36.7 C) 98.2 F (36.8 C)  TempSrc:  Oral  Oral Oral  SpO2: 100% 95% 100% 96%  Weight:       No intake or output data in the 24 hours ending 10/11/19 0741 Filed Weights   10/10/19 1519  Weight: 61.3 kg    Examination:  General:  Pleasantly resting in bed, No acute distress. HEENT:  Normocephalic atraumatic.  Sclerae nonicteric, noninjected.  Extraocular movements intact bilaterally. Neck:  Without mass or deformity.  Trachea is  midline. Lungs: Moderate inspiratory and expiratory wheeze audible from doorway otherwise without overt rales Heart:  Regular rate and rhythm.  Without murmurs, rubs, or gallops. Abdomen:  Soft, nontender, nondistended.  Without guarding or rebound. Extremities: Without cyanosis, clubbing, edema, or obvious deformity. Vascular:  Dorsalis pedis and posterior tibial pulses palpable bilaterally. Skin:  Warm and dry, no erythema, no ulcerations.   Data Reviewed: I have personally reviewed following labs and imaging studies  CBC: Recent Labs  Lab 10/10/19 1151  WBC 5.3  NEUTROABS 2.2  HGB 12.7  HCT 38.0  MCV 100.5*  PLT 875   Basic Metabolic Panel: Recent Labs  Lab 10/10/19 1151  NA 139  K 4.1  CL 98  CO2 29  GLUCOSE 85  BUN 23  CREATININE 0.96  CALCIUM 9.8   GFR: Estimated Creatinine Clearance: 45.1 mL/min (by C-G formula based on SCr of 0.96 mg/dL). Liver Function Tests: No results for input(s): AST, ALT, ALKPHOS, BILITOT, PROT, ALBUMIN in the last 168 hours. No results for input(s): LIPASE, AMYLASE in the last 168 hours. No results for input(s): AMMONIA in the last 168 hours. Coagulation Profile: No results for input(s): INR, PROTIME in the last 168 hours. Cardiac Enzymes: No results for input(s): CKTOTAL, CKMB, CKMBINDEX, TROPONINI in the last 168 hours. BNP (last 3 results) No results for input(s): PROBNP in the last 8760 hours. HbA1C: No results for input(s): HGBA1C in the last 72 hours. CBG: No results for input(s): GLUCAP in the last 168 hours. Lipid Profile: No results for input(s): CHOL, HDL, LDLCALC, TRIG, CHOLHDL, LDLDIRECT in the last 72 hours. Thyroid Function Tests: No results for input(s): TSH, T4TOTAL, FREET4, T3FREE, THYROIDAB in the last 72 hours. Anemia Panel: No results for input(s): VITAMINB12, FOLATE, FERRITIN, TIBC, IRON, RETICCTPCT in the last 72 hours. Sepsis Labs: No results for input(s): PROCALCITON, LATICACIDVEN in the last 168  hours.  Recent Results (from the past 240 hour(s))  SARS Coronavirus 2 by RT PCR (hospital order, performed in [facility] hospital lab) Nasopharyngeal Nasopharyngeal Swab     Status: None   Collection Time: 10/10/19 11:51 AM   Specimen: Nasopharyngeal Swab  Result Value Ref Range Status   SARS Coronavirus 2 NEGATIVE NEGATIVE Final    Comment: (NOTE) SARS-CoV-2 target nucleic acids are NOT DETECTED.  The SARS-CoV-2 RNA is generally detectable in upper and lower respiratory specimens during the acute phase of infection. The lowest concentration of SARS-CoV-2 viral copies this assay can detect is 250 copies / mL. A negative result does not preclude SARS-CoV-2 infection and should not be used as the sole basis for treatment or other patient management decisions.  A negative result may occur with improper specimen collection / handling, submission of specimen other than nasopharyngeal swab, presence of viral mutation(s) within the areas targeted by this assay, and inadequate number of viral copies (<250 copies / mL). A negative result must be combined with clinical observations, patient history, and epidemiological information.  Fact Sheet for Patients:   StrictlyIdeas.no  Fact Sheet for Healthcare Providers: BankingDealers.co.za  This test  is not yet approved or  cleared by the [place] and has been authorized for detection and/or diagnosis of SARS-CoV-2 by FDA under an Emergency Use Authorization (EUA).  This EUA will remain in effect (meaning this test can be used) for the duration of the COVID-19 declaration under Section 564(b)(1) of the Act, 21 U.S.C. section 360bbb-3(b)(1), unless the authorization is terminated or revoked sooner.  Performed at [facility], [place] [address], [place], [place] [address]      Radiology Studies: DG Chest 2 View  Result Date: 10/10/2019 CLINICAL DATA:  Shortness of  breath and COPD. EXAM: CHEST - 2 VIEW COMPARISON:  Aug 23, 2019 FINDINGS: Injectable port in stable position. Mildly enlarged cardiac silhouette. Aneurysmal dilation of the aorta better demonstrated on recent chest CT. Mild coarsening of the interstitial markings. No focal airspace consolidation. Chronic elevation of the left hemidiaphragm. Osseous structures are without acute abnormality. Soft tissues are grossly normal. IMPRESSION: 1. Mild coarsening of the interstitial markings. 2. No focal airspace consolidation. 3. Aneurysmal dilation of the aorta better demonstrated on recent chest CT. Electronically Signed   By: Fidela Salisbury M.D.   On: 10/10/2019 12:45   Scheduled Meds: . amLODipine  10 mg Oral Daily  . atorvastatin  20 mg Oral Daily  . Chlorhexidine Gluconate Cloth  6 each Topical Daily  . chlorthalidone  50 mg Oral Daily  . famotidine  20 mg Oral Daily  . FLUoxetine  20 mg Oral Daily  . ipratropium-albuterol  3 mL Nebulization Q6H  . lisinopril  10 mg Oral Daily  . methylPREDNISolone (SOLU-MEDROL) injection  60 mg Intravenous Q8H  . rivaroxaban  20 mg Oral Q supper   Continuous Infusions:   LOS: 1 day   Time spent: 40 min  Little Ishikawa, DO [facility]  If 7PM-7AM, please contact night-coverage [email or web address]  10/11/2019, 7:41 AM

## 2019-10-11 NOTE — Consult Note (Addendum)
NAME:  Debra Rodgers, MRN:  [id number], DOB:  October 03, 1945, LOS: 1 ADMISSION DATE:  10/10/2019, CONSULTATION DATE:  7/19 REFERRING MD:  Dr. Avon Gully, CHIEF COMPLAINT:  Shortness of Breath    Brief History   74 y/o F, former smoker, admitted 7/18 with worsening shortness of breath.   History of present illness   74 y/o F, former smoker, admitted on 7/18 with reports of acutely worsening shortness of breath.    The patient is a former smoker (quit in 10/2018).  She was diagnosed with Stage IV NSCLC, squamous cell carcinoma in 10/2018 with RUL lung mass in addition to pleural based metastasis with mediastinal lymphadenopathy.  She has been treated with carboplatin, paclitaxel and Keytruda.  She is currently on Keytruda Q3 weeks.  She was seen at the [facility] on 7/7 with plans to proceed with next cycle of Keytruda and with follow up imaging.    She reports she has been outside and thinks the heat has made her more short of breath.  She has noted increased nasal drainage, cough with clear sputum production, shortness of breath, wheezing.  She denies fevers, chills, N/V,D.  Reports compliance with medications at home.  She is not on O2 at baseline and prior to this admit was independent of all ADL's.  She notes she visited a house recently to pick up furniture.  COVID testing negative on presentation.  She was treated initially with albuterol, atrovent, steroids and O2. CXR negative for acute process. She is followed by Dr. Lamonte Sakai and is on [place].  She recently completed a taper of steroids as outpatient.  Of note, on medication review, she is on an ACE-I.    PCCM consulted for evaluation.     Past Medical History  COPD - PFT in 2017 with <6 second effort, estimated moderate airway obstruction, no BD response  Tobacco Abuse Lung Cancer - diagnosed 10/2018 DVT - on Xarelto  OSA  HTN  Depression   Significant Hospital Events   7/18 Admit   Consults:    Procedures:    Significant  Diagnostic Tests:     Micro Data:  COVID 7/18 >> negative   Antimicrobials:    Interim history/subjective:  Pt reports ongoing wheezing and shortness of breath.   Objective   Blood pressure 117/79, pulse 90, temperature 98.2 F (36.8 C), temperature source Oral, resp. rate (!) 22, weight 61.3 kg, SpO2 91 %.       No intake or output data in the 24 hours ending 10/11/19 1125 Filed Weights   10/10/19 1519  Weight: 61.3 kg    Examination: General: elderly adult female lying in bed in NAD  HEENT: MM pink/moist, upper airway wheezing  Neuro: AAOx4, speech clear, MAE  CV: s1s2 RRR, no m/r/g PULM: prolonged exp phase, lungs bilaterally with wheezing, suspect some component of referred wheeze  GI: soft, bsx4 active  Extremities: warm/dry, no edema  Skin: no rashes or lesions  Resolved Hospital Problem list      Assessment & Plan:   Moderate Obstructive Lung Disease  Emphysema  Wheezing COPD  Stage IV NSCLCA, Squamous Cell  Post Nasal Drip   Discussion: Admit with worsening shortness of breath.  Baseline Stage IV NSCLC, moderate obstructive lung disease on Stiolto. She does not wear O2 at home.  No acute process noted on CT imaging.  Recent CT chest with moderate centrilobular emphysema.  RVP pending.  COVID negative.  Rule out viral illness, consider ACE-I and Keytruda as contributing factor,  seasonal allergies.  No evidence of volume overload on exam. ? Element of upper airway syndrome with significant upper wheeze.   -assess RVP, r/o viral trigger for acute change  -note ACE-I, may need to change given cough -change pulmonary regimen for now to brovana, pulmicort + yupelri  -PRN duoneb  -continue steroids  -PRN cough suppression  -add flonase for PND -hold home stiolto  -follow intermittent CXR  -wean O2 to off for sats 88-95%, will need ambulatory O2 needs assessment prior to discharge   OSA  -continue CPAP QHS and PRN daytime sleep  Hx DVT  -continue xarelto    Best practice:  Per Primary   Labs   CBC: Recent Labs  Lab 10/10/19 1151 10/11/19 0917  WBC 5.3 11.9*  NEUTROABS 2.2  --   HGB 12.7 11.6*  HCT 38.0 35.2*  MCV 100.5* 100.0  PLT 229 607    Basic Metabolic Panel: Recent Labs  Lab 10/10/19 1151 10/11/19 0917  NA 139 136  K 4.1 3.8  CL 98 96*  CO2 29 28  GLUCOSE 85 180*  BUN 23 32*  CREATININE 0.96 1.02*  CALCIUM 9.8 9.7   GFR: Estimated Creatinine Clearance: 42.4 mL/min (A) (by C-G formula based on SCr of 1.02 mg/dL (H)). Recent Labs  Lab 10/10/19 1151 10/11/19 0917  WBC 5.3 11.9*    Liver Function Tests: Recent Labs  Lab 10/11/19 0917  AST 24  ALT 17  ALKPHOS 69  BILITOT 0.7  PROT 8.2*  ALBUMIN 4.4   No results for input(s): LIPASE, AMYLASE in the last 168 hours. No results for input(s): AMMONIA in the last 168 hours.  ABG No results found for: PHART, PCO2ART, PO2ART, HCO3, TCO2, ACIDBASEDEF, O2SAT   Coagulation Profile: No results for input(s): INR, PROTIME in the last 168 hours.  Cardiac Enzymes: No results for input(s): CKTOTAL, CKMB, CKMBINDEX, TROPONINI in the last 168 hours.  HbA1C: Hgb A1c MFr Bld  Date/Time Value Ref Range Status  10/13/2017 09:22 AM 5.9 (H) 4.8 - 5.6 % Final    Comment:             Prediabetes: 5.7 - 6.4          Diabetes: >6.4          Glycemic control for adults with diabetes: <7.0   09/23/2016 10:06 AM 5.8 (H) 4.8 - 5.6 % Final    Comment:             Pre-diabetes: 5.7 - 6.4          Diabetes: >6.4          Glycemic control for adults with diabetes: <7.0     CBG: No results for input(s): GLUCAP in the last 168 hours.  Review of Systems: Positives in [place]  Gen: Denies fever, chills, weight change, fatigue, night sweats HEENT: Denies blurred vision, double vision, hearing loss, tinnitus, sinus congestion, rhinorrhea, sore throat, neck stiffness, dysphagia PULM: Denies shortness of breath, cough, sputum production, hemoptysis, wheezing CV: Denies chest  pain, edema, orthopnea, paroxysmal nocturnal dyspnea, palpitations GI: Denies abdominal pain, nausea, vomiting, diarrhea, hematochezia, melena, constipation, change in bowel habits GU: Denies dysuria, hematuria, polyuria, oliguria, urethral discharge Endocrine: Denies hot or cold intolerance, polyuria, polyphagia or appetite change Derm: Denies rash, dry skin, scaling or peeling skin change Heme: Denies easy bruising, bleeding, bleeding gums Neuro: Denies headache, numbness, weakness, slurred speech, loss of memory or consciousness   Past Medical History  She,  has a past medical  history of COPD (chronic obstructive pulmonary disease) ([place]), Depression, Essential hypertension, Headache, History of migraine headaches, lung ca (dx'd 10/2018), Sleep apnea, and Tobacco use disorder.   Surgical History    Past Surgical History:  Procedure Laterality Date  . ABDOMINAL HYSTERECTOMY    . CHOLECYSTECTOMY    . IR IMAGING GUIDED PORT INSERTION  02/24/2019  . ROTATOR CUFF REPAIR  3/04  . VIDEO BRONCHOSCOPY WITH ENDOBRONCHIAL ULTRASOUND Right 11/25/2018   Procedure: VIDEO BRONCHOSCOPY WITH ENDOBRONCHIAL ULTRASOUND;  Surgeon: Collene Gobble, MD;  Location: [facility];  Service: Thoracic;  Laterality: Right;     Social History   reports that she quit smoking about 11 months ago. Her smoking use included cigarettes. She started smoking about 57 years ago. She has a 56.00 pack-year smoking history. She has never used smokeless tobacco. She reports that she does not drink alcohol and does not use drugs.   Family History   Her family history includes Breast cancer in her sister; Cancer in her sister; Coronary artery disease in her mother; Diabetes in her sister; Heart disease in her father; Hypertension in her mother and sister; Stroke in her mother.   Allergies Allergies  Allergen Reactions  . Codeine Sulfate     REACTION: "makes me high"  . Pantoprazole Sodium Swelling and Rash    facial swelling      Home Medications  Prior to Admission medications   Medication Sig Start Date End Date Taking? Authorizing Provider  acetaminophen (TYLENOL) 325 MG tablet Take 2 tablets (650 mg total) by mouth every 6 (six) hours as needed. Patient taking differently: Take 650 mg by mouth every 6 (six) hours as needed for mild pain.  11/05/18  Yes Varney Biles, MD  albuterol (VENTOLIN HFA) 108 (90 Base) MCG/ACT inhaler Inhale 2 puffs into the lungs every 6 (six) hours as needed for wheezing or shortness of breath. 07/23/19  Yes Collene Gobble, MD  amLODipine (NORVASC) 10 MG tablet Take 1 tablet (10 mg total) by mouth daily. 07/19/19  Yes [name], MD  atorvastatin (LIPITOR) 20 MG tablet Take 1 tablet (20 mg total) by mouth daily. 07/19/19  Yes [name], MD  CALCIUM PO Take 1 tablet by mouth daily.   Yes [provider]  chlorthalidone (HYGROTON) 50 MG tablet Take 1 tablet (50 mg total) by mouth daily. 07/19/19  Yes [name], MD  famotidine (PEPCID) 20 MG tablet Take 20 mg by mouth daily.    Yes [provider]  FLUoxetine (PROZAC) 20 MG capsule Take 1 capsule (20 mg total) by mouth daily. 07/19/19  Yes [name], MD  HYDROcodone-acetaminophen ([place]) 7.5-325 MG tablet Take 1 tablet by mouth every 6 (six) hours as needed for moderate pain. 12/30/18  Yes Heilingoetter, Cassandra L, PA-C  lidocaine-prilocaine (EMLA) cream Apply 1 application topically as needed. Patient taking differently: Apply 1 application topically as needed (port access).  07/28/19  Yes Heilingoetter, Cassandra L, PA-C  lisinopril (ZESTRIL) 10 MG tablet Take 1 tablet (10 mg total) by mouth daily. 07/19/19  Yes [name], MD  polyvinyl alcohol (LIQUIFILM TEARS) 1.4 % ophthalmic solution Place 1 drop into both eyes as needed for dry eyes.   Yes [provider]  Potassium Chloride ER 20 MEQ TBCR TAKE 1 TABLET BY MOUTH EVERY DAY Patient taking differently:  Take 20 mEq by mouth daily.  10/04/19  Yes Curt Bears, MD  rivaroxaban (XARELTO) 20 MG TABS tablet Take 1 tablet (20 mg  total) by mouth daily with supper. 09/08/19  Yes Curt Bears, MD  STIOLTO RESPIMAT 2.5-2.5 MCG/ACT AERS INHALE 2 PUFFS BY MOUTH INTO THE LUNGS DAILY Patient taking differently: Take 2 puffs by mouth daily.  09/13/19  Yes Collene Gobble, MD  guaiFENesin-dextromethorphan (ROBITUSSIN DM) 100-10 MG/5ML syrup Take 5 mLs by mouth every 4 (four) hours as needed for cough. Patient not taking: Reported on 10/10/2019 08/26/19   [name], MD  methylPREDNISolone (MEDROL) 4 MG tablet Take 1 tablet (4 mg total) by mouth daily. Patient not taking: Reported on 10/10/2019 08/26/19   [name], MD     Critical care time: n/a      Noe Gens, MSN, NP-C [facility] 10/11/2019, 11:25 AM   Please see [email or web address] for pager details.

## 2019-10-11 NOTE — Progress Notes (Signed)
Pt having increased inspiratory and expiratory wheezes. Treatment received at 0815.  Notified MD. Andre Lefort

## 2019-10-12 DIAGNOSIS — J449 Chronic obstructive pulmonary disease, unspecified: Secondary | ICD-10-CM

## 2019-10-12 LAB — CBC
HCT: 36.1 % (ref 36.0–46.0)
Hemoglobin: 11.7 g/dL — ABNORMAL LOW (ref 12.0–15.0)
MCH: 33.1 pg (ref 26.0–34.0)
MCHC: 32.4 g/dL (ref 30.0–36.0)
MCV: 102.3 fL — ABNORMAL HIGH (ref 80.0–100.0)
Platelets: 257 10*3/uL (ref 150–400)
RBC: 3.53 MIL/uL — ABNORMAL LOW (ref 3.87–5.11)
RDW: 13.4 % (ref 11.5–15.5)
WBC: 15.5 10*3/uL — ABNORMAL HIGH (ref 4.0–10.5)
nRBC: 0 % (ref 0.0–0.2)

## 2019-10-12 LAB — COMPREHENSIVE METABOLIC PANEL
ALT: 19 U/L (ref 0–44)
AST: 29 U/L (ref 15–41)
Albumin: 4.2 g/dL (ref 3.5–5.0)
Alkaline Phosphatase: 68 U/L (ref 38–126)
Anion gap: 10 (ref 5–15)
BUN: 45 mg/dL — ABNORMAL HIGH (ref 8–23)
CO2: 30 mmol/L (ref 22–32)
Calcium: 9.6 mg/dL (ref 8.9–10.3)
Chloride: 98 mmol/L (ref 98–111)
Creatinine, Ser: 1.05 mg/dL — ABNORMAL HIGH (ref 0.44–1.00)
GFR calc Af Amer: >60 mL/min (ref >60)
GFR calc non Af Amer: 53 mL/min — ABNORMAL LOW (ref >60)
Glucose, Bld: 119 mg/dL — ABNORMAL HIGH (ref 70–99)
Potassium: 4 mmol/L (ref 3.5–5.1)
Sodium: 138 mmol/L (ref 135–145)
Total Bilirubin: 0.4 mg/dL (ref 0.3–1.2)
Total Protein: 7.9 g/dL (ref 6.5–8.1)

## 2019-10-12 MED ORDER — ALBUTEROL SULFATE (2.5 MG/3ML) 0.083% IN NEBU
2.5000 mg | INHALATION_SOLUTION | RESPIRATORY_TRACT | Status: DC
  Administered 2019-10-12 – 2019-10-15 (×20): 2.5 mg via RESPIRATORY_TRACT
  Filled 2019-10-12 (×19): qty 3

## 2019-10-12 NOTE — Progress Notes (Addendum)
NAME:  Debra Rodgers, MRN:  [id number], DOB:  [birth year], LOS: 2 ADMISSION DATE:  10/10/2019, CONSULTATION DATE:  7/19 REFERRING MD:  Dr. Avon Gully, CHIEF COMPLAINT:  Shortness of Breath    Brief History   74 y/o F, former smoker, admitted 7/18 with worsening shortness of breath.   The patient is a former smoker (quit in 10/2018).  She was diagnosed with Stage IV NSCLC, squamous cell carcinoma in 10/2018 with RUL lung mass in addition to pleural based metastasis with mediastinal lymphadenopathy.  She has been treated with carboplatin, paclitaxel and Keytruda.  She is currently on Keytruda Q3 weeks.  She was seen at the [facility] on 7/7 with plans to proceed with next cycle of Keytruda and with follow up imaging.    She reports she has been outside and thinks the heat has made her more short of breath.  She has noted increased nasal drainage, cough with clear sputum production, shortness of breath, wheezing.  She denies fevers, chills, N/V,D.  Reports compliance with medications at home.  She is not on O2 at baseline and prior to this admit was independent of all ADL's.  She notes she visited a house recently to pick up furniture.  COVID testing negative on presentation.  She was treated initially with albuterol, atrovent, steroids and O2. CXR negative for acute process. She is followed by Dr. Lamonte Sakai and is on [place].  She recently completed a taper of steroids as outpatient.  Of note, on medication review, she is on an ACE-I.    PCCM consulted for evaluation.     Past Medical History  COPD - PFT in 2017 with <6 second effort, estimated moderate airway obstruction, no BD response  Tobacco Abuse Lung Cancer - diagnosed 10/2018 DVT - on Xarelto  OSA  HTN  Depression   Significant Hospital Events   7/18 Admit  7/19 PCCM consulted 7/20 Feels some better but wheezing not improved  Consults:    Procedures:    Significant Diagnostic Tests:     Micro Data:  COVID 7/18 >> negative    Antimicrobials:    Interim history/subjective:  Pt reports breathing is "not good" Afebrile   Objective   Blood pressure 120/72, pulse 79, temperature 98.4 F (36.9 C), temperature source Oral, resp. rate (!) 24, weight 61.3 kg, SpO2 95 %.       No intake or output data in the 24 hours ending 10/12/19 1143 Filed Weights   10/10/19 1519  Weight: 61.3 kg    Examination: General: elderly adult female lying in bed in NAD, sleeping with CPAP in place  HEENT: MM pink/moist, CPAP mask in place Neuro: AAOx4, speech clear, MAE  CV: s1s2 rrr, no m/r/g PULM: non-labored on CPAP, wheezing bilaterally  GI: soft, bsx4 active  Extremities: warm/dry, no edema  Skin: no rashes or lesions  Resolved Hospital Problem list      Assessment & Plan:   Moderate Obstructive Lung Disease  Emphysema  Wheezing COPD  Stage IV NSCLCA, Squamous Cell  Post Nasal Drip   Discussion: Admit with worsening shortness of breath.  Baseline Stage IV NSCLC, moderate obstructive lung disease on Stiolto. She does not wear O2 at home.  No acute process noted on CXR.  Recent CT chest with moderate centrilobular emphysema.  RVP negative , COVID negative.  Consider ACE-I and Keytruda as contributing factor, seasonal allergies.  No evidence of volume overload on exam. ? Element of upper airway syndrome with significant upper wheeze.   -RVP  negative -stop ACE-I -will ask speech to eval upper airway  -continue brovana, pulmicort, yupelri  -continue solumedrol 60 mg IV Q8 -flonase for PND -hold home stiolto -wean O2 for sats 88-95%, assess ambulatory O2 needs prior to discharge  -PRN cough suppression  -consider CT evaluation of neck given absence of upper wheeze when on CPAP but note normal airway on 6/1 CT -consider ENT evaluation vs FOB for airway inspection if this does not resolve  OSA  -CPAP QHS & PRN daytime sleep   Hx DVT  -xarelto   Best practice:  Per Primary   Labs   CBC: Recent Labs  Lab  10/10/19 1151 10/11/19 0917 10/12/19 0315  WBC 5.3 11.9* 15.5*  NEUTROABS 2.2  --   --   HGB 12.7 11.6* 11.7*  HCT 38.0 35.2* 36.1  MCV 100.5* 100.0 102.3*  PLT 229 230 818    Basic Metabolic Panel: Recent Labs  Lab 10/10/19 1151 10/11/19 0917 10/12/19 0315  NA 139 136 138  K 4.1 3.8 4.0  CL 98 96* 98  CO2 29 28 30   GLUCOSE 85 180* 119*  BUN 23 32* 45*  CREATININE 0.96 1.02* 1.05*  CALCIUM 9.8 9.7 9.6   GFR: Estimated Creatinine Clearance: 41.2 mL/min (A) (by C-G formula based on SCr of 1.05 mg/dL (H)). Recent Labs  Lab 10/10/19 1151 10/11/19 0917 10/12/19 0315  WBC 5.3 11.9* 15.5*    Liver Function Tests: Recent Labs  Lab 10/11/19 0917 10/12/19 0315  AST 24 29  ALT 17 19  ALKPHOS 69 68  BILITOT 0.7 0.4  PROT 8.2* 7.9  ALBUMIN 4.4 4.2   No results for input(s): LIPASE, AMYLASE in the last 168 hours. No results for input(s): AMMONIA in the last 168 hours.  ABG No results found for: PHART, PCO2ART, PO2ART, HCO3, TCO2, ACIDBASEDEF, O2SAT   Coagulation Profile: No results for input(s): INR, PROTIME in the last 168 hours.  Cardiac Enzymes: No results for input(s): CKTOTAL, CKMB, CKMBINDEX, TROPONINI in the last 168 hours.  HbA1C: Hgb A1c MFr Bld  Date/Time Value Ref Range Status  10/13/2017 09:22 AM 5.9 (H) 4.8 - 5.6 % Final    Comment:             Prediabetes: 5.7 - 6.4          Diabetes: >6.4          Glycemic control for adults with diabetes: <7.0   09/23/2016 10:06 AM 5.8 (H) 4.8 - 5.6 % Final    Comment:             Pre-diabetes: 5.7 - 6.4          Diabetes: >6.4          Glycemic control for adults with diabetes: <7.0     CBG: No results for input(s): GLUCAP in the last 168 hours.   Critical care time: n/a      Noe Gens, MSN, NP-C [facility] 10/12/2019, 11:43 AM   Please see [email or web address] for pager details.

## 2019-10-12 NOTE — Progress Notes (Signed)
PROGRESS NOTE    Debra Rodgers  GGE:[id number] DOB: 01-18-1946 DOA: 10/10/2019 PCP: [name], MD   Brief Narrative:  Debra Rodgers is a 74 y.o. female with medical history significant of copd, lung cancer currently undergoing chemo, HTN, HLD who presents to the ED with acutely increased sob and wheezing that started overnight on the morning of admission. Pt has been compliant with copd regimen prior to admit. Denies fevers, sick contacts, chest pain. In the ED, pt noted to have active wheezing with sob. Pt given trial of IV steroid and neb tx without much improvement. CXR found to be unremarkable. COVID test was neg. Pt required [facility] in ED. Given concerns of COPD exacerbation, hospitalist consulted for consideration for admission.  Assessment & Plan:   Principal Problem:   COPD exacerbation ([place]) Active Problems:   Hyperlipidemia   Tobacco use   Essential hypertension   OSA (obstructive sleep apnea)   Stage IV squamous cell carcinoma of right lung (HCC)   DVT (deep venous thrombosis) (HCC)   Acute hypoxic respiratory failure in setting of acute COPD exacerbation, unknown remitting factor.  POA.   - Able to wean off of oxygen today at bedside this morning - Requested pulmonology to evaluate given patient has remained compliant with home medications per her history with generally worsening respiratory status over the past few weeks daily over the past 72 hours -ACE inhibitor discontinued 10/11/2019, if no improvement over the next 24 to 48 hours would consider bronchoscopy with pulmonology versus ENT evaluation for upper airway constriction/obstruction (previous imaging unremarkable) -Patient indicates overnight on CPAP her wheezing is markedly improved - Continue supportive care, oxygen, duo nebs, steroids; cough suppressants with Tussionex and Tessalon Perles - COVID test is neg  Leukemoid reaction in the setting of steroids  - Likely secondary to above; follow repeat  labs - Remains afebrile  Hyperlipidemia - Cont home atorvastatin  Prior hx of tobacco abuse - Quit smoking one year ago  Essential hypertension - Continue home medications amlodipine -lisinopril discontinued as above - Expect some elevation from baseline while acutely ill and hypoxic as above   Stage 4 squamous cell lung cancer - Followed by Dr. Julien Nordmann, last seen in office on 09/29/19 - Currently undergoing maintenance chemo, recently received cycle #11 of Keytruda on 7/7 - Unlikely related to acute respiratory status as above given unremarkable imaging  DVT, POA - Continue home xarelto   DVT prophylaxis: Xarelto  Code Status: Full code Family Communication: None present  Status is: Inpatient  Dispo: The patient is from: Home              Anticipated d/c is to: Home              Anticipated d/c date is: 48 to 72 hours pending clinical course              Patient currently not medically stable for discharge due to ongoing need for supplemental oxygen well above baseline, around-the-clock nebulizers, IV steroids and close monitoring with pulmonology and telemetry.  Consultants:   Pulmonology  Procedures:   None planned  Antimicrobials:  None indicated  Subjective: No acute issues or events overnight, patient continues to have marked wheezing this morning, continues to admit to worsening dyspnea with exertion.  She is currently tolerating breakfast without difficulty denies nausea, vomiting, diarrhea, constipation, headache, fevers, chills.  Objective: Vitals:   10/12/19 0738 10/12/19 1010 10/12/19 1221 10/12/19 1300  BP:  120/72  139/72  Pulse:  94  Resp:    (!) 28  Temp:    98.7 F (37.1 C)  TempSrc:    Oral  SpO2: 95%  90% 100%  Weight:       No intake or output data in the 24 hours ending 10/12/19 1456 Filed Weights   10/10/19 1519  Weight: 61.3 kg    Examination:  General:  Pleasantly resting in bed, No acute distress. HEENT:  Normocephalic  atraumatic.  Sclerae nonicteric, noninjected.  Extraocular movements intact bilaterally. Neck:  Without mass or deformity.  Trachea is midline. Lungs: Moderate inspiratory and expiratory wheeze audible from doorway (much louder at anterior trachea) otherwise without overt rales Heart:  Regular rate and rhythm.  Without murmurs, rubs, or gallops. Abdomen:  Soft, nontender, nondistended.  Without guarding or rebound. Extremities: Without cyanosis, clubbing, edema, or obvious deformity. Vascular:  Dorsalis pedis and posterior tibial pulses palpable bilaterally. Skin:  Warm and dry, no erythema, no ulcerations.   Data Reviewed: I have personally reviewed following labs and imaging studies  CBC: Recent Labs  Lab 10/10/19 1151 10/11/19 0917 10/12/19 0315  WBC 5.3 11.9* 15.5*  NEUTROABS 2.2  --   --   HGB 12.7 11.6* 11.7*  HCT 38.0 35.2* 36.1  MCV 100.5* 100.0 102.3*  PLT 229 230 169   Basic Metabolic Panel: Recent Labs  Lab 10/10/19 1151 10/11/19 0917 10/12/19 0315  NA 139 136 138  K 4.1 3.8 4.0  CL 98 96* 98  CO2 29 28 30   GLUCOSE 85 180* 119*  BUN 23 32* 45*  CREATININE 0.96 1.02* 1.05*  CALCIUM 9.8 9.7 9.6   GFR: Estimated Creatinine Clearance: 41.2 mL/min (A) (by C-G formula based on SCr of 1.05 mg/dL (H)). Liver Function Tests: Recent Labs  Lab 10/11/19 0917 10/12/19 0315  AST 24 29  ALT 17 19  ALKPHOS 69 68  BILITOT 0.7 0.4  PROT 8.2* 7.9  ALBUMIN 4.4 4.2   No results for input(s): LIPASE, AMYLASE in the last 168 hours. No results for input(s): AMMONIA in the last 168 hours. Coagulation Profile: No results for input(s): INR, PROTIME in the last 168 hours. Cardiac Enzymes: No results for input(s): CKTOTAL, CKMB, CKMBINDEX, TROPONINI in the last 168 hours. BNP (last 3 results) No results for input(s): PROBNP in the last 8760 hours. HbA1C: No results for input(s): HGBA1C in the last 72 hours. CBG: No results for input(s): GLUCAP in the last 168  hours. Lipid Profile: No results for input(s): CHOL, HDL, LDLCALC, TRIG, CHOLHDL, LDLDIRECT in the last 72 hours. Thyroid Function Tests: No results for input(s): TSH, T4TOTAL, FREET4, T3FREE, THYROIDAB in the last 72 hours. Anemia Panel: No results for input(s): VITAMINB12, FOLATE, FERRITIN, TIBC, IRON, RETICCTPCT in the last 72 hours. Sepsis Labs: No results for input(s): PROCALCITON, LATICACIDVEN in the last 168 hours.  Recent Results (from the past 240 hour(s))  SARS Coronavirus 2 by RT PCR (hospital order, performed in [facility] hospital lab) Nasopharyngeal Nasopharyngeal Swab     Status: None   Collection Time: 10/10/19 11:51 AM   Specimen: Nasopharyngeal Swab  Result Value Ref Range Status   SARS Coronavirus 2 NEGATIVE NEGATIVE Final    Comment: (NOTE) SARS-CoV-2 target nucleic acids are NOT DETECTED.  The SARS-CoV-2 RNA is generally detectable in upper and lower respiratory specimens during the acute phase of infection. The lowest concentration of SARS-CoV-2 viral copies this assay can detect is 250 copies / mL. A negative result does not preclude SARS-CoV-2 infection and should not  be used as the sole basis for treatment or other patient management decisions.  A negative result may occur with improper specimen collection / handling, submission of specimen other than nasopharyngeal swab, presence of viral mutation(s) within the areas targeted by this assay, and inadequate number of viral copies (<250 copies / mL). A negative result must be combined with clinical observations, patient history, and epidemiological information.  Fact Sheet for Patients:   StrictlyIdeas.no  Fact Sheet for Healthcare Providers: BankingDealers.co.za  This test is not yet approved or  cleared by the [place] FDA and has been authorized for detection and/or diagnosis of SARS-CoV-2 by FDA under an Emergency Use Authorization (EUA).  This EUA  will remain in effect (meaning this test can be used) for the duration of the COVID-19 declaration under Section 564(b)(1) of the Act, 21 U.S.C. section 360bbb-3(b)(1), unless the authorization is terminated or revoked sooner.  Performed at [facility], [place] [address] [place], [place] [address]   Respiratory Panel by PCR     Status: None   Collection Time: 10/11/19  1:42 PM   Specimen: Nasopharyngeal Swab; Respiratory  Result Value Ref Range Status   Adenovirus NOT DETECTED NOT DETECTED Final   Coronavirus 229E NOT DETECTED NOT DETECTED Final    Comment: (NOTE) The Coronavirus on the Respiratory Panel, DOES NOT test for the novel  Coronavirus (2019 nCoV)    Coronavirus HKU1 NOT DETECTED NOT DETECTED Final   Coronavirus NL63 NOT DETECTED NOT DETECTED Final   Coronavirus OC43 NOT DETECTED NOT DETECTED Final   Metapneumovirus NOT DETECTED NOT DETECTED Final   Rhinovirus / Enterovirus NOT DETECTED NOT DETECTED Final   Influenza A NOT DETECTED NOT DETECTED Final   Influenza B NOT DETECTED NOT DETECTED Final   Parainfluenza Virus 1 NOT DETECTED NOT DETECTED Final   Parainfluenza Virus 2 NOT DETECTED NOT DETECTED Final   Parainfluenza Virus 3 NOT DETECTED NOT DETECTED Final   Parainfluenza Virus 4 NOT DETECTED NOT DETECTED Final   Respiratory Syncytial Virus NOT DETECTED NOT DETECTED Final   Bordetella pertussis NOT DETECTED NOT DETECTED Final   Chlamydophila pneumoniae NOT DETECTED NOT DETECTED Final   Mycoplasma pneumoniae NOT DETECTED NOT DETECTED Final    Comment: Performed at [facility], [place]. [address] [place], [place] [address]     Radiology Studies: No results found. Scheduled Meds: . albuterol  2.5 mg Nebulization Q4H WA  . amLODipine  10 mg Oral Daily  . arformoterol  15 mcg Nebulization BID  . atorvastatin  20 mg Oral Daily  . budesonide (PULMICORT) nebulizer solution  0.5 mg Nebulization BID  . Chlorhexidine Gluconate Cloth  6 each  Topical Daily  . chlorthalidone  50 mg Oral Daily  . famotidine  20 mg Oral Daily  . FLUoxetine  20 mg Oral Daily  . fluticasone  1 spray Each Nare Daily  . methylPREDNISolone (SOLU-MEDROL) injection  60 mg Intravenous Q8H  . revefenacin  175 mcg Nebulization Daily  . rivaroxaban  20 mg Oral Q supper  . sodium chloride flush  10-40 mL Intracatheter Q12H   Continuous Infusions:   LOS: 2 days   Time spent: 40 min  Little Ishikawa, DO [facility]  If 7PM-7AM, please contact night-coverage [email or web address]  10/12/2019, 2:56 PM

## 2019-10-12 NOTE — TOC Initial Note (Signed)
Transition of Care [facility]) - Initial/Assessment Note    Patient Details  Name: Debra Rodgers MRN: [id number] Date of Birth: [birth year]  [facility] CM/SW Contact:    Dessa Phi, RN Phone Number: 10/12/2019, 10:26 AM  Clinical Narrative: Patient d/c plan home. Noted on 02 if home needed can arrange w/qualifying 02 sats,& home 02 order.                  Expected Discharge Plan: Home/Self Care Barriers to Discharge: Continued Medical Work up   Patient Goals and CMS Choice Patient states their goals for this hospitalization and ongoing recovery are:: go home CMS Medicare.gov Compare Post Acute Care list provided to:: Patient    Expected Discharge Plan and Services Expected Discharge Plan: Home/Self Care   Discharge Planning Services: CM Consult   Living arrangements for the past 2 months: Single Family Home                                      Prior Living Arrangements/Services Living arrangements for the past 2 months: Single Family Home Lives with:: Spouse Patient language and need for interpreter reviewed:: Yes Do you feel safe going back to the place where you live?: Yes      Need for Family Participation in Patient Care: No (Comment) Care giver support system in place?: Yes (comment) Current home services: DME (cane, rw) Criminal Activity/Legal Involvement Pertinent to Current Situation/Hospitalization: No - Comment as needed  Activities of Daily Living Home Assistive Devices/Equipment: CPAP, Walker (specify type), Eyeglasses (rolling walker) ADL Screening (condition at time of admission) Patient's cognitive ability adequate to safely complete daily activities?: Yes Is the patient deaf or have difficulty hearing?: No Does the patient have difficulty seeing, even when wearing glasses/contacts?: No Does the patient have difficulty concentrating, remembering, or making decisions?: No Patient able to express need for assistance with ADLs?:  Yes Does the patient have difficulty dressing or bathing?: No Independently performs ADLs?: Yes (appropriate for developmental age) Does the patient have difficulty walking or climbing stairs?: Yes (secondary to shortness of breath) Weakness of Legs: None Weakness of Arms/Hands: None  Permission Sought/Granted Permission sought to share information with : Case Manager Permission granted to share information with : Yes, Verbal Permission Granted  Share Information with NAME: Case Manager           Emotional Assessment Appearance:: Appears stated age Attitude/Demeanor/Rapport: Gracious Affect (typically observed): Accepting Orientation: : Oriented to Self, Oriented to Place, Oriented to  Time, Oriented to Situation Alcohol / Substance Use: Not Applicable Psych Involvement: No (comment)  Admission diagnosis:  Respiratory distress [R06.03] COPD exacerbation (HCC) [J44.1] Chronic obstructive pulmonary disease, unspecified COPD type ([place]) [J44.9] Patient Active Problem List   Diagnosis Date Noted  . Acute exacerbation of chronic obstructive pulmonary disease (COPD) ([place]) 08/24/2019  . COPD exacerbation ([place]) 08/23/2019  . DVT (deep venous thrombosis) ([place]) 07/12/2019  . Port-A-Cath in place 07/07/2019  . Cancer associated pain 12/30/2018  . Constipation 12/16/2018  . Stage IV squamous cell carcinoma of right lung ([place]) 12/02/2018  . Goals of care, counseling/discussion 12/02/2018  . Encounter for antineoplastic chemotherapy 12/02/2018  . Encounter for antineoplastic immunotherapy 12/02/2018  . Abdominal aortic aneurysm (AAA) 35 to 39 mm in diameter ([place]) 11/05/2018  . BPPV (benign paroxysmal positional vertigo) 12/02/2016  . COPD (chronic obstructive pulmonary disease) ([place]) 09/23/2016  . OSA (obstructive sleep apnea) 10/02/2015  .  Glucose intolerance 10/02/2015  . Adenomatous colon polyp 10/02/2015  . Tobacco use 04/17/2015  . Essential hypertension 04/17/2015  . Major  depressive disorder, recurrent episode ([place]) 04/17/2015  . Osteoarthritis cervical spine 05/26/2013  . GERD 06/25/2007  . Hyperlipidemia 07/29/2006   PCP:  [name], MD Pharmacy:   [id number] [place] [id number]: [phone]: [phone]    Social Determinants of Health (SDOH) Interventions    Readmission Risk Interventions Readmission Risk Prevention Plan 10/12/2019  Transportation Screening Complete  PCP or Specialist Appt within 3-5 Days Complete  [place]RI or [place]  Social Work Consult for [place]/Counseling Complete  Palliative Care Screening Not Applicable  Medication Review (Press photographer Complete  Some recent data might be hidden

## 2019-10-12 NOTE — Progress Notes (Signed)
   10/12/19 1300  Assess: MEWS Score  Temp 98.7 F (37.1 C)  BP 139/72  Pulse Rate 94  Resp (!) 28  Level of Consciousness Alert  SpO2 100 %  O2 Device Nasal Cannula  Assess: MEWS Score  MEWS Temp 0  MEWS Systolic 0  MEWS Pulse 0  MEWS RR 2  MEWS LOC 0  MEWS Score 2  MEWS Score Color Yellow  Assess: if the MEWS score is Yellow or Red  Were vital signs taken at a resting state? No (Pt returning from [facility] to Bed)  Focused Assessment No change from prior assessment  Early Detection of Sepsis Score *See Row Information* Low  MEWS guidelines implemented *See Row Information* Yes  Treat  MEWS Interventions Administered prn meds/treatments;Consulted Respiratory Therapy  Pain Scale 0-10  Pain Score 0

## 2019-10-13 ENCOUNTER — Inpatient Hospital Stay (HOSPITAL_COMMUNITY): Payer: Medicare HMO

## 2019-10-13 DIAGNOSIS — C3491 Malignant neoplasm of unspecified part of right bronchus or lung: Secondary | ICD-10-CM

## 2019-10-13 DIAGNOSIS — E785 Hyperlipidemia, unspecified: Secondary | ICD-10-CM

## 2019-10-13 DIAGNOSIS — R0603 Acute respiratory distress: Secondary | ICD-10-CM

## 2019-10-13 DIAGNOSIS — G4733 Obstructive sleep apnea (adult) (pediatric): Secondary | ICD-10-CM

## 2019-10-13 DIAGNOSIS — J449 Chronic obstructive pulmonary disease, unspecified: Secondary | ICD-10-CM | POA: Diagnosis not present

## 2019-10-13 DIAGNOSIS — I5023 Acute on chronic systolic (congestive) heart failure: Secondary | ICD-10-CM | POA: Diagnosis not present

## 2019-10-13 LAB — ECHOCARDIOGRAM COMPLETE
Area-P 1/2: 2.32 cm2
Height: 61.496 in
S' Lateral: 2.5 cm
Weight: 2162.27 oz

## 2019-10-13 LAB — COMPREHENSIVE METABOLIC PANEL
ALT: 20 U/L (ref 0–44)
AST: 24 U/L (ref 15–41)
Albumin: 3.7 g/dL (ref 3.5–5.0)
Alkaline Phosphatase: 58 U/L (ref 38–126)
Anion gap: 7 (ref 5–15)
BUN: 43 mg/dL — ABNORMAL HIGH (ref 8–23)
CO2: 32 mmol/L (ref 22–32)
Calcium: 9.3 mg/dL (ref 8.9–10.3)
Chloride: 97 mmol/L — ABNORMAL LOW (ref 98–111)
Creatinine, Ser: 0.94 mg/dL (ref 0.44–1.00)
GFR calc Af Amer: >60 mL/min (ref >60)
GFR calc non Af Amer: >60 mL/min (ref >60)
Glucose, Bld: 136 mg/dL — ABNORMAL HIGH (ref 70–99)
Potassium: 4.2 mmol/L (ref 3.5–5.1)
Sodium: 136 mmol/L (ref 135–145)
Total Bilirubin: 0.4 mg/dL (ref 0.3–1.2)
Total Protein: 7.2 g/dL (ref 6.5–8.1)

## 2019-10-13 LAB — CBC
HCT: 33.2 % — ABNORMAL LOW (ref 36.0–46.0)
Hemoglobin: 10.9 g/dL — ABNORMAL LOW (ref 12.0–15.0)
MCH: 32.7 pg (ref 26.0–34.0)
MCHC: 32.8 g/dL (ref 30.0–36.0)
MCV: 99.7 fL (ref 80.0–100.0)
Platelets: 210 10*3/uL (ref 150–400)
RBC: 3.33 MIL/uL — ABNORMAL LOW (ref 3.87–5.11)
RDW: 13.4 % (ref 11.5–15.5)
WBC: 10.7 10*3/uL — ABNORMAL HIGH (ref 4.0–10.5)
nRBC: 0 % (ref 0.0–0.2)

## 2019-10-13 MED ORDER — FAMOTIDINE 20 MG PO TABS
20.0000 mg | ORAL_TABLET | Freq: Two times a day (BID) | ORAL | Status: DC
  Administered 2019-10-13 – 2019-10-17 (×8): 20 mg via ORAL
  Filled 2019-10-13 (×8): qty 1

## 2019-10-13 NOTE — Progress Notes (Signed)
  Echocardiogram 2D Echocardiogram has been performed.  Debra Rodgers 10/13/2019, 2:12 PM

## 2019-10-13 NOTE — Progress Notes (Signed)
NAME:  Debra Rodgers, MRN:  [id number], DOB:  [birth year], LOS: 3 ADMISSION DATE:  10/10/2019, CONSULTATION DATE:  7/19 REFERRING MD:  Dr. Avon Gully, CHIEF COMPLAINT:  Shortness of Breath    Brief History   74 y/o F, former smoker, admitted 7/18 with worsening shortness of breath.   The patient is a former smoker (quit in 10/2018).  She was diagnosed with Stage IV NSCLC, squamous cell carcinoma in 10/2018 with RUL lung mass in addition to pleural based metastasis with mediastinal lymphadenopathy.  She has been treated with carboplatin, paclitaxel and Keytruda.  She is currently on Keytruda Q3 weeks.  She was seen at the [facility] on 7/7 with plans to proceed with next cycle of Keytruda and with follow up imaging.    She reports she has been outside and thinks the heat has made her more short of breath.  She has noted increased nasal drainage, cough with clear sputum production, shortness of breath, wheezing.  She denies fevers, chills, N/V,D.  Reports compliance with medications at home.  She is not on O2 at baseline and prior to this admit was independent of all ADL's.  She notes she visited a house recently to pick up furniture.  COVID testing negative on presentation.  She was treated initially with albuterol, atrovent, steroids and O2. CXR negative for acute process. She is followed by Dr. Lamonte Sakai and is on [place].  She recently completed a taper of steroids as outpatient.  Of note, on medication review, she is on an ACE-I.    PCCM consulted for evaluation.    Past Medical History  COPD - PFT in 2017 with <6 second effort, estimated moderate airway obstruction, no BD response  Tobacco Abuse Lung Cancer - diagnosed 10/2018 DVT - on Xarelto  OSA  HTN  Depression   Significant Hospital Events   7/18 Admit  7/19 PCCM consulted 7/20 Feels some better but wheezing not improved  Consults:  Pulmonary   Procedures:    Significant Diagnostic Tests:     Micro Data:  COVID 7/18 >  negative   Antimicrobials:    Interim history/subjective:  Pt reports breathing is "not good" Afebrile   Objective   Blood pressure (!) 152/91, pulse 84, temperature 98.3 F (36.8 C), temperature source Oral, resp. rate (!) 22, height 5' 1.5" (1.562 m), weight 61.3 kg, SpO2 97 %.        Intake/Output Summary (Last 24 hours) at 10/13/2019 1057 Last data filed at 10/13/2019 1034 Gross per 24 hour  Intake 600 ml  Output 400 ml  Net 200 ml   Filed Weights   10/10/19 1519 10/12/19 1644  Weight: 61.3 kg 61.3 kg    Examination: General: elderly adult female sitting in bed, mild respiratory distress  HEENT: MM pink/moist Neuro: alert, oriented, follows commands  CV: Tachy, no MRG  PULM: Diffuse upper airway wheeze, mild tachypnea GI: soft, bsx4 active  Extremities: warm/dry, no edema  Skin: no rashes or lesions  Resolved Hospital Problem list      Assessment & Plan:   Moderate Obstructive Lung Disease  Emphysema  Wheezing COPD  Stage IV NSCLCA, Squamous Cell  Post Nasal Drip  Discussion: Admit with worsening shortness of breath.  Baseline Stage IV NSCLC, moderate obstructive lung disease on Stiolto. She does not wear O2 at home.  No acute process noted on CXR.  Recent CT chest with moderate centrilobular emphysema.  RVP negative , COVID negative.  Consider ACE-I and Keytruda as contributing factor, seasonal  allergies.  No evidence of volume overload on exam. ? Element of upper airway syndrome with significant upper wheeze.  -RVP/COVID negative -ACE-I Stopped  Plan  -ST Evaluation Pending -continue brovana, pulmicort, yupelri  -continue solumedrol 60 mg IV Q8 -flonase for PND -hold home stiolto -wean O2 for sats 88-95%, assess ambulatory O2 needs prior to discharge  -PRN cough suppression  -CT Neck Pending >> May need ENT consult  -ECHO ordered  -Increased Pepcid to BID for possible GERD component   OSA  Plan -CPAP QHS & PRN daytime sleep   Hx DVT  Plan  -xarelto   Best practice:  Per Primary   Labs   CBC: Recent Labs  Lab 10/10/19 1151 10/11/19 0917 10/12/19 0315 10/13/19 0315  WBC 5.3 11.9* 15.5* 10.7*  NEUTROABS 2.2  --   --   --   HGB 12.7 11.6* 11.7* 10.9*  HCT 38.0 35.2* 36.1 33.2*  MCV 100.5* 100.0 102.3* 99.7  PLT 229 230 257 681    Basic Metabolic Panel: Recent Labs  Lab 10/10/19 1151 10/11/19 0917 10/12/19 0315 10/13/19 0315  NA 139 136 138 136  K 4.1 3.8 4.0 4.2  CL 98 96* 98 97*  CO2 29 28 30  32  GLUCOSE 85 180* 119* 136*  BUN 23 32* 45* 43*  CREATININE 0.96 1.02* 1.05* 0.94  CALCIUM 9.8 9.7 9.6 9.3   GFR: Estimated Creatinine Clearance: 46 mL/min (by C-G formula based on SCr of 0.94 mg/dL). Recent Labs  Lab 10/10/19 1151 10/11/19 0917 10/12/19 0315 10/13/19 0315  WBC 5.3 11.9* 15.5* 10.7*    Liver Function Tests: Recent Labs  Lab 10/11/19 0917 10/12/19 0315 10/13/19 0315  AST 24 29 24   ALT 17 19 20   ALKPHOS 69 68 58  BILITOT 0.7 0.4 0.4  PROT 8.2* 7.9 7.2  ALBUMIN 4.4 4.2 3.7   No results for input(s): LIPASE, AMYLASE in the last 168 hours. No results for input(s): AMMONIA in the last 168 hours.  ABG No results found for: PHART, PCO2ART, PO2ART, HCO3, TCO2, ACIDBASEDEF, O2SAT   Coagulation Profile: No results for input(s): INR, PROTIME in the last 168 hours.  Cardiac Enzymes: No results for input(s): CKTOTAL, CKMB, CKMBINDEX, TROPONINI in the last 168 hours.  HbA1C: Hgb A1c MFr Bld  Date/Time Value Ref Range Status  10/13/2017 09:22 AM 5.9 (H) 4.8 - 5.6 % Final    Comment:             Prediabetes: 5.7 - 6.4          Diabetes: >6.4          Glycemic control for adults with diabetes: <7.0   09/23/2016 10:06 AM 5.8 (H) 4.8 - 5.6 % Final    Comment:             Pre-diabetes: 5.7 - 6.4          Diabetes: >6.4          Glycemic control for adults with diabetes: <7.0     CBG: No results for input(s): GLUCAP in the last 168 hours.   Hayden Pedro, AGACNP-BC  [facility]  Pgr: [phone]  PCCM Pgr: [phone]

## 2019-10-13 NOTE — Consult Note (Signed)
The nurse after huddle asked me to visit with pt. Pt was alert and aware sitting up in bed. She expressed what issue she is dealing with and hoping for a good outcome. The chaplain offered caring and supportive presence, prayers and blessings. Further visits will be offered.

## 2019-10-13 NOTE — Progress Notes (Signed)
PT Cancellation Note  Patient Details Name: Debra Rodgers MRN: [id number] DOB: Oct 12, 1945   Cancelled Treatment:    Reason Eval/Treat Not Completed:  (Discussed pt current status with RN, reports pt very SOB wtih bed<>BSC transfer and cannot tolerate more activity. Recommend hold until results of CT neck returned. Will follow up when pt is able and as schedule allows.)   Verner Mould, [place]  Office [phone] Pager [phone]  10/13/2019 4:53 PM

## 2019-10-13 NOTE — Progress Notes (Signed)
[facility]  PROGRESS NOTE  Debra Rodgers FAO:[id number] DOB: 1945-05-21 DOA: 10/10/2019 PCP: [name], MD Admit date - 10/10/2019   Admitting Physician Donne Hazel, MD  Outpatient Primary MD for the patient is [name], MD  LOS - 3 Brief Narrative   Debra Rodgers is a 74 y.o. year old female with medical history significant for NSCLC currently undergoing chemotherapy, HTN, emphysema who presented on 10/10/2019 with worsening shortness of breath, productive cough and wheezing with minimal improvement with home inhalers and was found to have acute hypoxic respiratory failure presumed secondary to COPD exacerbation.  Hospital course complicated by persistent O2 requirements, persistent wheezing in all lung fields and conversational dyspnea despite medical management for presumed COPD exacerbation  Subjective  Today thinks her breathing has not improved at all, still having cough productive of clear sputum, still significantly wheezing despite inhalers and steroids  A & P   Acute hypoxic respiratory failure, persists.  Persistent wheezing in all lung fields worsening dyspnea and productive cough with minimal improvement despite IV steroids and bronchodilators for presumed COPD exacerbation with new oxygen requirement of 3 L (home regimen room air).  Differential also includes vocal cord dysfunction, acute tracheal narrowing, side effect from lisinopril, acid reflux, progression of known NSCLC. COVID test neg -Speech to evaluate -Appreciate PCCM recommendations we will continue steroids, Pulmicort, scheduled albuterol nebs, Brovana, sitter and possible bronc evaluation pending results of TTE/CT neck -Obtaining CT of neck to evaluate soft tissue, pending results may warrant ENT consultation -Check TTE, doubt CHF given volume status (BNP 20.5) -Pepcid twice daily -Wean O2 as able, SPO2 goal greater than 88% given emphysema   COPD.  Has emphysema.  Persistent  wheeze and cough, unclear digestive related to COPD or some other etiologies mentioned above -Continue scheduled IV Solu-Medrol, scheduled albuterol nebs, Brovana, Pulmicort, revefenacin nebs -Quit smoking a year ago  Leukemoid reaction related to IV steroids.  Has remained afebrile -Continue to monitor  HTN, stable -continue amlodipine -lisinopril discontinued in setting of chronic cough/wheeze  Stage IV non-small cell lung cancer, squamous cell carcinoma of the right lung September 2020 CTA chest 08/24/2019 showed stable pulmonary nodules -Undergoing outpatient chemotherapy with Dr. Earlie Server  History of DVT, POA -Home Xarelto  Descending thoracic aortic, aneurysmal dilatation.  3.8 cm in diameter on CT imaging 08/24/2019 -need outpatient follow-up for reimaging  Hyperlipidemia, stable -Home Lipitor  Mood disorder, stable -continue home Prozac  OSA, stable-continue home CPAP  Family Communication  : None  Code Status : Full  Disposition Plan  :  Patient is from home. Anticipated d/c date: 2 to 3 days. Barriers to d/c or necessity for inpatient status:  New O2 requirements, persistent wheezing/dyspnea needs further evaluation with CT imaging and possible ENT consultation Consults  : PCCM  Procedures  : TTE, 7/21 pending  DVT Prophylaxis  : Home Xarelto  Lab Results  Component Value Date   PLT 210 10/13/2019    Diet :  Diet Order            Diet regular Room service appropriate? Yes; Fluid consistency: Thin  Diet effective now                  Inpatient Medications Scheduled Meds: . albuterol  2.5 mg Nebulization Q4H WA  . amLODipine  10 mg Oral Daily  . arformoterol  15 mcg Nebulization BID  . atorvastatin  20 mg Oral Daily  . budesonide (PULMICORT) nebulizer solution  0.5 mg Nebulization BID  .  Chlorhexidine Gluconate Cloth  6 each Topical Daily  . chlorthalidone  50 mg Oral Daily  . famotidine  20 mg Oral BID  . FLUoxetine  20 mg Oral Daily  .  fluticasone  1 spray Each Nare Daily  . methylPREDNISolone (SOLU-MEDROL) injection  60 mg Intravenous Q8H  . revefenacin  175 mcg Nebulization Daily  . rivaroxaban  20 mg Oral Q supper  . sodium chloride flush  10-40 mL Intracatheter Q12H   Continuous Infusions: PRN Meds:.acetaminophen **OR** acetaminophen, albuterol, benzonatate, chlorpheniramine-HYDROcodone, guaiFENesin-dextromethorphan, HYDROcodone-acetaminophen, phenol, sodium chloride flush  Antibiotics  :   Anti-infectives (From admission, onward)   None       Objective   Vitals:   10/12/19 2257 10/13/19 0430 10/13/19 0746 10/13/19 1227  BP: 134/85 (!) 152/91  110/67  Pulse: 83 84  93  Resp: 20 (!) 22  (!) 21  Temp: 98.1 F (36.7 C) 98.3 F (36.8 C)  98.2 F (36.8 C)  TempSrc: Oral Oral  Oral  SpO2: 98% 98% 97% 100%  Weight:      Height:        SpO2: 100 % O2 Flow Rate (L/min): 3 L/min  Wt Readings from Last 3 Encounters:  10/12/19 61.3 kg  09/29/19 63.1 kg  09/08/19 62.9 kg     Intake/Output Summary (Last 24 hours) at 10/13/2019 1346 Last data filed at 10/13/2019 1034 Gross per 24 hour  Intake 360 ml  Output 400 ml  Net -40 ml    Physical Exam:     Awake Alert, Oriented X 3, Normal affect No new F.N deficits,  [place].AT, Diffuse wheezing heard in all lung fields, audible wheezing, also amenable to, conversational dyspnea present, no accessory muscle usage +ve B.Sounds, Abd Soft, No tenderness, No rebound, guarding or rigidity. No Cyanosis, No new Rash or bruise     I have personally reviewed the following:   Data Reviewed:  CBC Recent Labs  Lab 10/10/19 1151 10/11/19 0917 10/12/19 0315 10/13/19 0315  WBC 5.3 11.9* 15.5* 10.7*  HGB 12.7 11.6* 11.7* 10.9*  HCT 38.0 35.2* 36.1 33.2*  PLT 229 230 257 210  MCV 100.5* 100.0 102.3* 99.7  MCH 33.6 33.0 33.1 32.7  MCHC 33.4 33.0 32.4 32.8  RDW 13.2 13.2 13.4 13.4  LYMPHSABS 1.2  --   --   --   MONOABS 0.6  --   --   --   EOSABS 1.2*  --    --   --   BASOSABS 0.1  --   --   --     Chemistries  Recent Labs  Lab 10/10/19 1151 10/11/19 0917 10/12/19 0315 10/13/19 0315  NA 139 136 138 136  K 4.1 3.8 4.0 4.2  CL 98 96* 98 97*  CO2 29 28 30  32  GLUCOSE 85 180* 119* 136*  BUN 23 32* 45* 43*  CREATININE 0.96 1.02* 1.05* 0.94  CALCIUM 9.8 9.7 9.6 9.3  AST  --  24 29 24   ALT  --  17 19 20   ALKPHOS  --  69 68 58  BILITOT  --  0.7 0.4 0.4   ------------------------------------------------------------------------------------------------------------------ No results for input(s): CHOL, HDL, LDLCALC, TRIG, CHOLHDL, LDLDIRECT in the last 72 hours.  Lab Results  Component Value Date   HGBA1C 5.9 (H) 10/13/2017   ------------------------------------------------------------------------------------------------------------------ No results for input(s): TSH, T4TOTAL, T3FREE, THYROIDAB in the last 72 hours.  Invalid input(s): FREET3 ------------------------------------------------------------------------------------------------------------------ No results for input(s): VITAMINB12, FOLATE, FERRITIN, TIBC, IRON, RETICCTPCT in the last  72 hours.  Coagulation profile No results for input(s): INR, PROTIME in the last 168 hours.  No results for input(s): DDIMER in the last 72 hours.  Cardiac Enzymes No results for input(s): CKMB, TROPONINI, MYOGLOBIN in the last 168 hours.  Invalid input(s): CK ------------------------------------------------------------------------------------------------------------------    Component Value Date/Time   BNP 20.5 10/10/2019 1151    Micro Results Recent Results (from the past 240 hour(s))  SARS Coronavirus 2 by RT PCR (hospital order, performed in [facility] hospital lab) Nasopharyngeal Nasopharyngeal Swab     Status: None   Collection Time: 10/10/19 11:51 AM   Specimen: Nasopharyngeal Swab  Result Value Ref Range Status   SARS Coronavirus 2 NEGATIVE NEGATIVE Final    Comment:  (NOTE) SARS-CoV-2 target nucleic acids are NOT DETECTED.  The SARS-CoV-2 RNA is generally detectable in upper and lower respiratory specimens during the acute phase of infection. The lowest concentration of SARS-CoV-2 viral copies this assay can detect is 250 copies / mL. A negative result does not preclude SARS-CoV-2 infection and should not be used as the sole basis for treatment or other patient management decisions.  A negative result may occur with improper specimen collection / handling, submission of specimen other than nasopharyngeal swab, presence of viral mutation(s) within the areas targeted by this assay, and inadequate number of viral copies (<250 copies / mL). A negative result must be combined with clinical observations, patient history, and epidemiological information.  Fact Sheet for Patients:   StrictlyIdeas.no  Fact Sheet for Healthcare Providers: BankingDealers.co.za  This test is not yet approved or  cleared by the [place] FDA and has been authorized for detection and/or diagnosis of SARS-CoV-2 by FDA under an Emergency Use Authorization (EUA).  This EUA will remain in effect (meaning this test can be used) for the duration of the COVID-19 declaration under Section 564(b)(1) of the Act, 21 U.S.C. section 360bbb-3(b)(1), unless the authorization is terminated or revoked sooner.  Performed at [facility], [place] [address], [place], [place] [address]   Respiratory Panel by PCR     Status: None   Collection Time: 10/11/19  1:42 PM   Specimen: Nasopharyngeal Swab; Respiratory  Result Value Ref Range Status   Adenovirus NOT DETECTED NOT DETECTED Final   Coronavirus 229E NOT DETECTED NOT DETECTED Final    Comment: (NOTE) The Coronavirus on the Respiratory Panel, DOES NOT test for the novel  Coronavirus (2019 nCoV)    Coronavirus HKU1 NOT DETECTED NOT DETECTED Final   Coronavirus NL63 NOT  DETECTED NOT DETECTED Final   Coronavirus OC43 NOT DETECTED NOT DETECTED Final   Metapneumovirus NOT DETECTED NOT DETECTED Final   Rhinovirus / Enterovirus NOT DETECTED NOT DETECTED Final   Influenza A NOT DETECTED NOT DETECTED Final   Influenza B NOT DETECTED NOT DETECTED Final   Parainfluenza Virus 1 NOT DETECTED NOT DETECTED Final   Parainfluenza Virus 2 NOT DETECTED NOT DETECTED Final   Parainfluenza Virus 3 NOT DETECTED NOT DETECTED Final   Parainfluenza Virus 4 NOT DETECTED NOT DETECTED Final   Respiratory Syncytial Virus NOT DETECTED NOT DETECTED Final   Bordetella pertussis NOT DETECTED NOT DETECTED Final   Chlamydophila pneumoniae NOT DETECTED NOT DETECTED Final   Mycoplasma pneumoniae NOT DETECTED NOT DETECTED Final    Comment: Performed at [facility], [place]. [address], [place], [place] [address]    Radiology Reports DG Chest 2 View  Result Date: 10/10/2019 CLINICAL DATA:  Shortness of breath and COPD. EXAM: CHEST -  2 VIEW COMPARISON:  Aug 23, 2019 FINDINGS: Injectable port in stable position. Mildly enlarged cardiac silhouette. Aneurysmal dilation of the aorta better demonstrated on recent chest CT. Mild coarsening of the interstitial markings. No focal airspace consolidation. Chronic elevation of the left hemidiaphragm. Osseous structures are without acute abnormality. Soft tissues are grossly normal. IMPRESSION: 1. Mild coarsening of the interstitial markings. 2. No focal airspace consolidation. 3. Aneurysmal dilation of the aorta better demonstrated on recent chest CT. Electronically Signed   By: Fidela Salisbury M.D.   On: 10/10/2019 12:45     Time Spent in minutes  30     Desiree Hane M.D on 10/13/2019 at 1:46 PM  To page go to [email or web address] - password [facility]

## 2019-10-13 NOTE — Evaluation (Addendum)
SLP Cancellation Note  Patient Details Name: Debra Rodgers MRN: [id number] DOB: [birth year]   Cancelled treatment:       Reason Eval/Treat Not Completed: Other (comment) (Order for VCD diagnosis received, reached out to CCM NP regarding pending diagnosis- she provided approval to hold until results of CT neck returned.  Will follow for appropriateness to provide VCD compensation strategies.  Thanks.   Kathleen Lime, MS [facility] SLP Acute Rehab Services Office [phone]  Macario Golds 10/13/2019, 2:06 PM

## 2019-10-13 NOTE — Care Management Important Message (Signed)
Important Message  Patient Details IM Letter given to Dessa Phi RN Case Manager to present to the Patient Name: Debra Rodgers MRN: [id number] Date of Birth: Aug 19, 1945   Medicare Important Message Given:  Yes     Kerin Salen 10/13/2019, 10:32 AM

## 2019-10-13 NOTE — Plan of Care (Signed)

## 2019-10-14 LAB — CBC
HCT: 33.9 % — ABNORMAL LOW (ref 36.0–46.0)
Hemoglobin: 11.1 g/dL — ABNORMAL LOW (ref 12.0–15.0)
MCH: 32.8 pg (ref 26.0–34.0)
MCHC: 32.7 g/dL (ref 30.0–36.0)
MCV: 100.3 fL — ABNORMAL HIGH (ref 80.0–100.0)
Platelets: 214 10*3/uL (ref 150–400)
RBC: 3.38 MIL/uL — ABNORMAL LOW (ref 3.87–5.11)
RDW: 13.3 % (ref 11.5–15.5)
WBC: 8.6 10*3/uL (ref 4.0–10.5)
nRBC: 0 % (ref 0.0–0.2)

## 2019-10-14 LAB — COMPREHENSIVE METABOLIC PANEL
ALT: 20 U/L (ref 0–44)
AST: 21 U/L (ref 15–41)
Albumin: 3.5 g/dL (ref 3.5–5.0)
Alkaline Phosphatase: 56 U/L (ref 38–126)
Anion gap: 10 (ref 5–15)
BUN: 34 mg/dL — ABNORMAL HIGH (ref 8–23)
CO2: 31 mmol/L (ref 22–32)
Calcium: 9.4 mg/dL (ref 8.9–10.3)
Chloride: 95 mmol/L — ABNORMAL LOW (ref 98–111)
Creatinine, Ser: 0.89 mg/dL (ref 0.44–1.00)
GFR calc Af Amer: >60 mL/min (ref >60)
GFR calc non Af Amer: >60 mL/min (ref >60)
Glucose, Bld: 133 mg/dL — ABNORMAL HIGH (ref 70–99)
Potassium: 4 mmol/L (ref 3.5–5.1)
Sodium: 136 mmol/L (ref 135–145)
Total Bilirubin: 0.6 mg/dL (ref 0.3–1.2)
Total Protein: 6.8 g/dL (ref 6.5–8.1)

## 2019-10-14 NOTE — Evaluation (Signed)
Physical Therapy Evaluation Patient Details Name: Debra Rodgers MRN: [id number] DOB: 1945/12/18 Today's Date: 10/14/2019   History of Present Illness  74 y.o. year old female with medical history significant for NSCLC currently undergoing chemotherapy, HTN, emphysema who presented on 10/10/2019 with worsening shortness of breath, productive cough and wheezing with minimal improvement with home inhalers and was found to have acute hypoxic respiratory failure presumed secondary to COPD exacerbation.  Clinical Impression  Pt admitted with above diagnosis.  Pt currently with functional limitations due to the deficits listed below (see PT Problem List). Pt will benefit from skilled PT to increase their independence and safety with mobility to allow discharge to the venue listed below.  Pt and RN reports pt is having a better day today so pt agreeable to OOB.  Pt ambulated around bed over to recliner on 3L O2 [place].  Pt reports some SOB and rest breaks with activities at home prior to admission ("I just do what I can")  Pt has equipment at home and is considering HHPT.     Follow Up Recommendations Home health PT    Equipment Recommendations  None recommended by PT    Recommendations for Other Services       Precautions / Restrictions Precautions Precautions: Fall Precaution Comments: monitor sats      Mobility  Bed Mobility Overal bed mobility: Modified Independent                Transfers Overall transfer level: Needs assistance Equipment used: None Transfers: Sit to/from Stand Sit to Stand: Min guard         General transfer comment: min/guard for safety  Ambulation/Gait Ambulation/Gait assistance: Min guard Gait Distance (Feet): 10 Feet Assistive device:  (pt held bed rails) Gait Pattern/deviations: Step-through pattern;Decreased stride length     General Gait Details: pt ambulated aruond bed over to recliner; maintained 3L O2 [place] and SPO2 93% (96% at rest)  Chief Strategy Officer    Modified Rankin (Stroke Patients Only)       Balance Overall balance assessment: Needs assistance         Standing balance support: Bilateral upper extremity supported Standing balance-Leahy Scale: Poor Standing balance comment: pt utilizing UE support for mobility however able to stand static without UE support                             Pertinent Vitals/Pain Pain Assessment: No/denies pain    Home Living Family/patient expects to be discharged to:: Private residence Living Arrangements: Spouse/significant other Available Help at Discharge: Family Type of Home: House Home Access: Stairs to enter Entrance Stairs-Rails: Left Entrance Stairs-Number of Steps: 3 Home Layout: One level;Laundry or work area in [employer]: [place];Shower seat;Bedside commode;Cane - single point;Walker - 4 wheels Additional Comments: pt also has 5 dogs at home; some information from recent admission    Prior Function Level of Independence: Independent         Comments: pt reports she does not wear oxygen at home, pt states she has been taking rest breaks as needed for IADLs     Hand Dominance        Extremity/Trunk Assessment        Lower Extremity Assessment Lower Extremity Assessment: Generalized weakness       Communication   Communication: No difficulties  Cognition Arousal/Alertness: Awake/alert Behavior During Therapy:  WFL for tasks assessed/performed Overall Cognitive Status: Within Functional Limits for tasks assessed                                        General Comments      Exercises     Assessment/Plan    PT Assessment Patient needs continued PT services  PT Problem List Decreased strength;Decreased mobility;Decreased activity tolerance;Decreased balance;Decreased knowledge of use of DME       PT Treatment Interventions DME instruction;Therapeutic  exercise;Gait training;Balance training;Stair training;Functional mobility training;Therapeutic activities;Patient/family education    PT Goals (Current goals can be found in the Care Plan section)  Acute Rehab PT Goals PT Goal Formulation: With patient Time For Goal Achievement: 10/28/19 Potential to Achieve Goals: Good    Frequency Min 3X/week   Barriers to discharge        Co-evaluation               AM-PAC PT "6 Clicks" Mobility  Outcome Measure Help needed turning from your back to your side while in a flat bed without using bedrails?: A Little Help needed moving from lying on your back to sitting on the side of a flat bed without using bedrails?: A Little Help needed moving to and from a bed to a chair (including a wheelchair)?: A Little Help needed standing up from a chair using your arms (e.g., wheelchair or bedside chair)?: A Little Help needed to walk in hospital room?: A Little Help needed climbing 3-5 steps with a railing? : A Little 6 Click Score: 18    End of Session Equipment Utilized During Treatment: Oxygen Activity Tolerance: Patient tolerated treatment well Patient left: in chair;with call bell/phone within reach (pt aware to use call bell and have assist for mobility for safety)   PT Visit Diagnosis: Difficulty in walking, not elsewhere classified (R26.2)    Time: 0626-9485 PT Time Calculation (min) (ACUTE ONLY): 11 min   Charges:   PT Evaluation $PT Eval Low Complexity: 1 Low         Kati PT, DPT Acute Rehabilitation Services Pager: [phone] Office: [phone]  York Ram E 10/14/2019, 3:02 PM

## 2019-10-14 NOTE — Plan of Care (Signed)

## 2019-10-14 NOTE — Progress Notes (Signed)
[facility]  PROGRESS NOTE  Debra Rodgers JSH:[id number] DOB: 01-31-1946 DOA: 10/10/2019 PCP: [name], MD Admit date - 10/10/2019   Admitting Physician Donne Hazel, MD  Outpatient Primary MD for the patient is [name], MD  LOS - 4 Brief Narrative   Debra Rodgers is a 74 y.o. year old female with medical history significant for NSCLC currently undergoing chemotherapy, HTN, emphysema who presented on 10/10/2019 with worsening shortness of breath, productive cough and wheezing with minimal improvement with home inhalers and was found to have acute hypoxic respiratory failure presumed secondary to COPD exacerbation.  Hospital course complicated by persistent O2 requirements, persistent wheezing in all lung fields and conversational dyspnea despite medical management for presumed COPD exacerbation  Subjective  Today thinks her breathing is much improved, still having cough but much less wheezing  A & P   Acute hypoxic respiratory failure, persists but seems to be improving today.  Persistent wheezing in all lung fields with conversational dyspnea and productive cough; however wheezing is much less notable in the last 24 hours.  CT soft tissue neck showed possible laryngeal edema.  Bedside evaluation by ENT with scope showed no evidence of upper airway pathology or vocal cord dysfunction.  Patient also feels much better, still has wheezing but significantly improved in last 24 hours, still requiring 3 L with SPO2 dropping to 80s with weaning by nursing ( no O2 at home).  TTE shows diastolic dysfunction, euvolemic on exam, EF preserved.  IV steroids and bronchodilators for presumed COPD exacerbation with new oxygen requirement of 3 L (  Differential also includes v, side effect from lisinopril, acid reflux, progression of known NSCLC. COVID test neg -Speech to evaluate -Appreciate PCCM recommendations we will continue steroids, Pulmicort, scheduled albuterol nebs,  Brovana,  -Pepcid twice daily -Wean O2 as able, SPO2 goal greater than 88% given emphysema  COPD.  Has emphysema.  Persistent wheeze and cough, unclear if only related to COPD or some other etiologies mentioned above -Continue scheduled IV Solu-Medrol, scheduled albuterol nebs, Brovana, Pulmicort, revefenacin nebs -Quit smoking a year ago  Leukemoid reaction related to IV steroids, resolved.  Has remained afebrile -Continue to monitor  HTN, stable -continue amlodipine -lisinopril discontinued in setting of chronic cough/wheeze  Stage IV non-small cell lung cancer, squamous cell carcinoma of the right lung September 2020 CTA chest 08/24/2019 showed stable pulmonary nodules -Undergoing outpatient chemotherapy with Dr. Earlie Server  History of DVT, POA -Home Xarelto  Descending thoracic aortic, aneurysmal dilatation.  3.8 cm in diameter on CT imaging 08/24/2019 -need outpatient follow-up for reimaging  Hyperlipidemia, stable -Home Lipitor  Mood disorder, stable -continue home Prozac  OSA, stable-continue home CPAP  Family Communication  : None  Code Status : Full  Disposition Plan  :  Patient is from home. Anticipated d/c date: 2 to 3 days. Barriers to d/c or necessity for inpatient status:  New O2 requirements, persistent wheezing/dyspnea, finally started to improve, but still requires O2 and IV steroids, continue to closely monitor respiratory status Consults  : PCCM, ENT  Procedures  : TTE, 7/21 pending  DVT Prophylaxis  : Home Xarelto  Lab Results  Component Value Date   PLT 214 10/14/2019    Diet :  Diet Order            Diet regular Room service appropriate? Yes; Fluid consistency: Thin  Diet effective now                  Inpatient  Medications Scheduled Meds: . albuterol  2.5 mg Nebulization Q4H WA  . amLODipine  10 mg Oral Daily  . arformoterol  15 mcg Nebulization BID  . atorvastatin  20 mg Oral Daily  . budesonide (PULMICORT) nebulizer solution  0.5 mg  Nebulization BID  . Chlorhexidine Gluconate Cloth  6 each Topical Daily  . chlorthalidone  50 mg Oral Daily  . famotidine  20 mg Oral BID  . FLUoxetine  20 mg Oral Daily  . fluticasone  1 spray Each Nare Daily  . methylPREDNISolone (SOLU-MEDROL) injection  60 mg Intravenous Q8H  . revefenacin  175 mcg Nebulization Daily  . rivaroxaban  20 mg Oral Q supper  . sodium chloride flush  10-40 mL Intracatheter Q12H   Continuous Infusions: PRN Meds:.acetaminophen **OR** acetaminophen, albuterol, benzonatate, chlorpheniramine-HYDROcodone, guaiFENesin-dextromethorphan, HYDROcodone-acetaminophen, phenol, sodium chloride flush  Antibiotics  :   Anti-infectives (From admission, onward)   None       Objective   Vitals:   10/14/19 0910 10/14/19 1134 10/14/19 1316 10/14/19 1602  BP: (!) 142/78  121/77   Pulse:   89   Resp:   21   Temp:   98.3 F (36.8 C)   TempSrc:   Oral   SpO2:  93% 100% 98%  Weight:      Height:        SpO2: 98 % O2 Flow Rate (L/min): 3 L/min  Wt Readings from Last 3 Encounters:  10/12/19 61.3 kg  09/29/19 63.1 kg  09/08/19 62.9 kg     Intake/Output Summary (Last 24 hours) at 10/14/2019 1635 Last data filed at 10/14/2019 1034 Gross per 24 hour  Intake 240 ml  Output 900 ml  Net -660 ml    Physical Exam:     Awake Alert, Oriented X 3, Normal affect No new F.N deficits,  [place].AT, Diffuse wheezing heard in all lung fields but noticeably improved in the last 24 hours, audible wheezing, minimal conversational dyspnea present, no accessory muscle usage +ve B.Sounds, Abd Soft, No tenderness, No rebound, guarding or rigidity. No Cyanosis, No new Rash or bruise     I have personally reviewed the following:   Data Reviewed:  CBC Recent Labs  Lab 10/10/19 1151 10/11/19 0917 10/12/19 0315 10/13/19 0315 10/14/19 0415  WBC 5.3 11.9* 15.5* 10.7* 8.6  HGB 12.7 11.6* 11.7* 10.9* 11.1*  HCT 38.0 35.2* 36.1 33.2* 33.9*  PLT 229 230 257 210 214  MCV  100.5* 100.0 102.3* 99.7 100.3*  MCH 33.6 33.0 33.1 32.7 32.8  MCHC 33.4 33.0 32.4 32.8 32.7  RDW 13.2 13.2 13.4 13.4 13.3  LYMPHSABS 1.2  --   --   --   --   MONOABS 0.6  --   --   --   --   EOSABS 1.2*  --   --   --   --   BASOSABS 0.1  --   --   --   --     Chemistries  Recent Labs  Lab 10/10/19 1151 10/11/19 0917 10/12/19 0315 10/13/19 0315 10/14/19 0415  NA 139 136 138 136 136  K 4.1 3.8 4.0 4.2 4.0  CL 98 96* 98 97* 95*  CO2 29 28 30  32 31  GLUCOSE 85 180* 119* 136* 133*  BUN 23 32* 45* 43* 34*  CREATININE 0.96 1.02* 1.05* 0.94 0.89  CALCIUM 9.8 9.7 9.6 9.3 9.4  AST  --  24 29 24 21   ALT  --  17 19 20  20  ALKPHOS  --  69 68 58 56  BILITOT  --  0.7 0.4 0.4 0.6   ------------------------------------------------------------------------------------------------------------------ No results for input(s): CHOL, HDL, LDLCALC, TRIG, CHOLHDL, LDLDIRECT in the last 72 hours.  Lab Results  Component Value Date   HGBA1C 5.9 (H) 10/13/2017   ------------------------------------------------------------------------------------------------------------------ No results for input(s): TSH, T4TOTAL, T3FREE, THYROIDAB in the last 72 hours.  Invalid input(s): FREET3 ------------------------------------------------------------------------------------------------------------------ No results for input(s): VITAMINB12, FOLATE, FERRITIN, TIBC, IRON, RETICCTPCT in the last 72 hours.  Coagulation profile No results for input(s): INR, PROTIME in the last 168 hours.  No results for input(s): DDIMER in the last 72 hours.  Cardiac Enzymes No results for input(s): CKMB, TROPONINI, MYOGLOBIN in the last 168 hours.  Invalid input(s): CK ------------------------------------------------------------------------------------------------------------------    Component Value Date/Time   BNP 20.5 10/10/2019 1151    Micro Results Recent Results (from the past 240 hour(s))  SARS Coronavirus 2 by  RT PCR (hospital order, performed in [facility] hospital lab) Nasopharyngeal Nasopharyngeal Swab     Status: None   Collection Time: 10/10/19 11:51 AM   Specimen: Nasopharyngeal Swab  Result Value Ref Range Status   SARS Coronavirus 2 NEGATIVE NEGATIVE Final    Comment: (NOTE) SARS-CoV-2 target nucleic acids are NOT DETECTED.  The SARS-CoV-2 RNA is generally detectable in upper and lower respiratory specimens during the acute phase of infection. The lowest concentration of SARS-CoV-2 viral copies this assay can detect is 250 copies / mL. A negative result does not preclude SARS-CoV-2 infection and should not be used as the sole basis for treatment or other patient management decisions.  A negative result may occur with improper specimen collection / handling, submission of specimen other than nasopharyngeal swab, presence of viral mutation(s) within the areas targeted by this assay, and inadequate number of viral copies (<250 copies / mL). A negative result must be combined with clinical observations, patient history, and epidemiological information.  Fact Sheet for Patients:   StrictlyIdeas.no  Fact Sheet for Healthcare Providers: BankingDealers.co.za  This test is not yet approved or  cleared by the [place] FDA and has been authorized for detection and/or diagnosis of SARS-CoV-2 by FDA under an Emergency Use Authorization (EUA).  This EUA will remain in effect (meaning this test can be used) for the duration of the COVID-19 declaration under Section 564(b)(1) of the Act, 21 U.S.C. section 360bbb-3(b)(1), unless the authorization is terminated or revoked sooner.  Performed at [facility], [place] [address] [place], [place] [address]   Respiratory Panel by PCR     Status: None   Collection Time: 10/11/19  1:42 PM   Specimen: Nasopharyngeal Swab; Respiratory  Result Value Ref Range Status   Adenovirus NOT  DETECTED NOT DETECTED Final   Coronavirus 229E NOT DETECTED NOT DETECTED Final    Comment: (NOTE) The Coronavirus on the Respiratory Panel, DOES NOT test for the novel  Coronavirus (2019 nCoV)    Coronavirus HKU1 NOT DETECTED NOT DETECTED Final   Coronavirus NL63 NOT DETECTED NOT DETECTED Final   Coronavirus OC43 NOT DETECTED NOT DETECTED Final   Metapneumovirus NOT DETECTED NOT DETECTED Final   Rhinovirus / Enterovirus NOT DETECTED NOT DETECTED Final   Influenza A NOT DETECTED NOT DETECTED Final   Influenza B NOT DETECTED NOT DETECTED Final   Parainfluenza Virus 1 NOT DETECTED NOT DETECTED Final   Parainfluenza Virus 2 NOT DETECTED NOT DETECTED Final   Parainfluenza Virus 3 NOT DETECTED NOT DETECTED Final   Parainfluenza Virus 4 NOT  DETECTED NOT DETECTED Final   Respiratory Syncytial Virus NOT DETECTED NOT DETECTED Final   Bordetella pertussis NOT DETECTED NOT DETECTED Final   Chlamydophila pneumoniae NOT DETECTED NOT DETECTED Final   Mycoplasma pneumoniae NOT DETECTED NOT DETECTED Final    Comment: Performed at [facility], [place] [address] [place], [place] [address]    Radiology Reports DG Chest 2 View  Result Date: 10/10/2019 CLINICAL DATA:  Shortness of breath and COPD. EXAM: CHEST - 2 VIEW COMPARISON:  Aug 23, 2019 FINDINGS: Injectable port in stable position. Mildly enlarged cardiac silhouette. Aneurysmal dilation of the aorta better demonstrated on recent chest CT. Mild coarsening of the interstitial markings. No focal airspace consolidation. Chronic elevation of the left hemidiaphragm. Osseous structures are without acute abnormality. Soft tissues are grossly normal. IMPRESSION: 1. Mild coarsening of the interstitial markings. 2. No focal airspace consolidation. 3. Aneurysmal dilation of the aorta better demonstrated on recent chest CT. Electronically Signed   By: Fidela Salisbury M.D.   On: 10/10/2019 12:45   CT SOFT TISSUE NECK WO CONTRAST  Result Date:  10/13/2019 CLINICAL DATA:  Laryngeal spasm. Wheezing. Assess for laryngeal edema. EXAM: CT NECK WITHOUT CONTRAST TECHNIQUE: Multidetector CT imaging of the neck was performed following the standard protocol without intravenous contrast. COMPARISON:  None. FINDINGS: Pharynx and larynx: The study suffers from motion degradation, particularly in the region of the larynx. There may be a degree of voluntary glottic closure. There does not appear to be definite mucosal or submucosal edema in the region. Cannot evaluate for a small mass or polyp given the closed glottis. Salivary glands: Parotid and submandibular glands are normal. Thyroid: Normal Lymph nodes: No enlarged or low-density nodes on either side of the neck detected on this noncontrast study. Vascular: No vascular pathology evident other than aortic atherosclerosis. Port-A-Cath in place on the right. Limited intracranial: Normal Visualized orbits: Normal Mastoids and visualized paranasal sinuses: No significant sinus disease. Skeleton: Previous cervical fusion C3 through C5. Degenerative spondylosis above and below that. Prominent torus changes of maxilla in the region of the palate, more extensive on the left than the right. Upper chest: Emphysema.  No focal or active process. Other: None IMPRESSION: The study suffers from motion degradation. Additionally, the glottis appears to be held in likely voluntary closure. There does not appear to be gross edema of the mucosal or submucosal tissues in that region and therefore I assume the closed appearance is voluntary. If not, there could be a mild degree of laryngeal edema. No evidence of mass or lymphadenopathy. Electronically Signed   By: Nelson Chimes M.D.   On: 10/13/2019 19:20   ECHOCARDIOGRAM COMPLETE  Result Date: 10/13/2019    ECHOCARDIOGRAM REPORT   Patient Name:   [facility] Date of Exam: 10/13/2019 Medical Rec #:  [id number]     Height:       61.5 in Accession #:    [id number]    Weight:       135.1  lb Date of Birth:  1946-01-27     BSA:          1.609 m Patient Age:    19 years      BP:           152/91 mmHg Patient Gender: F             HR:           86 bpm. Exam Location:  Inpatient Procedure: 2D Echo, Cardiac Doppler and Color Doppler Indications:  I50.23 Acute on chronic systolic (congestive) heart failure  History:        Patient has no prior history of Echocardiogram examinations.                 COPD; Risk Factors:Former Smoker, Hypertension and Sleep Apnea.  Sonographer:    Tiffany Dance Referring Phys: [place]  1. Left ventricular ejection fraction, by estimation, is 65 to 70%. The left ventricle has normal function. The left ventricle has no regional wall motion abnormalities. There is moderate focal basal septal left ventricular hypertrophy. Left ventricular  diastolic parameters are consistent with Grade I diastolic dysfunction (impaired relaxation). There was turbulence across the LV outflow tract but no significant LVOT gradient was measured.  2. Right ventricular systolic function is normal. The right ventricular size is normal. There is normal pulmonary artery systolic pressure. The estimated right ventricular systolic pressure is 67.1 mmHg.  3. The mitral valve is normal in structure. No evidence of mitral valve regurgitation. No evidence of mitral stenosis.  4. The aortic valve is tricuspid. Aortic valve regurgitation is not visualized. No aortic stenosis is present.  5. Aortic dilatation noted. There is mild dilatation of the ascending aorta measuring 37 mm.  6. The inferior vena cava is normal in size with greater than 50% respiratory variability, suggesting right atrial pressure of 3 mmHg. FINDINGS  Left Ventricle: Left ventricular ejection fraction, by estimation, is 65 to 70%. The left ventricle has normal function. The left ventricle has no regional wall motion abnormalities. The left ventricular internal cavity size was normal in size. There is  moderate  focal basal septal left ventricular hypertrophy. Left ventricular diastolic parameters are consistent with Grade I diastolic dysfunction (impaired relaxation). Right Ventricle: The right ventricular size is normal. No increase in right ventricular wall thickness. Right ventricular systolic function is normal. There is normal pulmonary artery systolic pressure. The tricuspid regurgitant velocity is 2.39 m/s, and  with an assumed right atrial pressure of 3 mmHg, the estimated right ventricular systolic pressure is 24.5 mmHg. Left Atrium: Left atrial size was normal in size. Right Atrium: Right atrial size was normal in size. Pericardium: There is no evidence of pericardial effusion. Mitral Valve: The mitral valve is normal in structure. No evidence of mitral valve regurgitation. No evidence of mitral valve stenosis. Tricuspid Valve: The tricuspid valve is normal in structure. Tricuspid valve regurgitation is trivial. Aortic Valve: The aortic valve is tricuspid. Aortic valve regurgitation is not visualized. No aortic stenosis is present. Pulmonic Valve: The pulmonic valve was normal in structure. Pulmonic valve regurgitation is not visualized. Aorta: The aortic root is normal in size and structure and aortic dilatation noted. There is mild dilatation of the ascending aorta measuring 37 mm. Venous: The inferior vena cava is normal in size with greater than 50% respiratory variability, suggesting right atrial pressure of 3 mmHg. IAS/Shunts: No atrial level shunt detected by color flow Doppler.  LEFT VENTRICLE PLAX 2D LVIDd:         3.70 cm  Diastology LVIDs:         2.50 cm  LV e' lateral:   7.29 cm/s LV PW:         1.00 cm  LV E/e' lateral: 9.7 LV IVS:        1.40 cm  LV e' medial:    5.66 cm/s LVOT diam:     1.80 cm  LV E/e' medial:  12.4 LV SV:  86 LV SV Index:   53 LVOT Area:     2.54 cm  RIGHT VENTRICLE             IVC RV Basal diam:  2.40 cm     IVC diam: 1.90 cm RV S prime:     20.30 cm/s TAPSE (M-mode):  1.9 cm LEFT ATRIUM             Index       RIGHT ATRIUM          Index LA diam:        2.70 cm 1.68 cm/m  RA Area:     9.46 cm LA Vol (A2C):   61.4 ml 38.17 ml/m RA Volume:   16.90 ml 10.51 ml/m LA Vol (A4C):   43.5 ml 27.04 ml/m LA Biplane Vol: 51.5 ml 32.01 ml/m  AORTIC VALVE LVOT Vmax:   179.00 cm/s LVOT Vmean:  113.000 cm/s LVOT VTI:    0.338 m  AORTA Ao Root diam: 3.40 cm Ao Asc diam:  3.70 cm MITRAL VALVE               TRICUSPID VALVE MV Area (PHT): 2.32 cm    TR Peak grad:   22.8 mmHg MV Decel Time: 327 msec    TR Vmax:        239.00 cm/s MV E velocity: 70.40 cm/s MV A velocity: 84.60 cm/s  SHUNTS MV E/A ratio:  0.83        Systemic VTI:  0.34 m                            Systemic Diam: 1.80 cm Loralie Champagne MD Electronically signed by Loralie Champagne MD Signature Date/Time: 10/13/2019/5:05:21 PM    Final      Time Spent in minutes  30     Desiree Hane M.D on 10/14/2019 at 4:35 PM  To page go to [email or web address] - password [facility]

## 2019-10-14 NOTE — Progress Notes (Signed)
NAME:  Debra Rodgers, MRN:  [id number], DOB:  [birth year], LOS: 4 ADMISSION DATE:  10/10/2019, CONSULTATION DATE:  7/19 REFERRING MD:  Dr. Avon Gully, CHIEF COMPLAINT:  Shortness of Breath    Brief History   74 y/o F, former smoker, admitted 7/18 with worsening shortness of breath.   The patient is a former smoker (quit in 10/2018).  She was diagnosed with Stage IV NSCLC, squamous cell carcinoma in 10/2018 with RUL lung mass in addition to pleural based metastasis with mediastinal lymphadenopathy.  She has been treated with carboplatin, paclitaxel and Keytruda.  She is currently on Keytruda Q3 weeks.  She was seen at the [facility] on 7/7 with plans to proceed with next cycle of Keytruda and with follow up imaging.    She reports she has been outside and thinks the heat has made her more short of breath.  She has noted increased nasal drainage, cough with clear sputum production, shortness of breath, wheezing.  She denies fevers, chills, N/V,D.  Reports compliance with medications at home.  She is not on O2 at baseline and prior to this admit was independent of all ADL's.  She notes she visited a house recently to pick up furniture.  COVID testing negative on presentation.  She was treated initially with albuterol, atrovent, steroids and O2. CXR negative for acute process. She is followed by Dr. Lamonte Sakai and is on [place].  She recently completed a taper of steroids as outpatient.  Of note, on medication review, she is on an ACE-I.    PCCM consulted for evaluation.    Past Medical History  COPD - PFT in 2017 with <6 second effort, estimated moderate airway obstruction, no BD response  Tobacco Abuse Lung Cancer - diagnosed 10/2018 DVT - on Xarelto  OSA  HTN  Depression   Significant Hospital Events   7/18 Admit  7/19 PCCM consulted 7/20 Feels some better but wheezing not improved  Consults:  Pulmonary   Procedures:    Significant Diagnostic Tests:     Micro Data:  COVID 7/18 >  negative   Antimicrobials:    Interim history/subjective:  Overnight with continued wheezing. However this AM with reported improved breathing.   Objective   Blood pressure (!) 142/78, pulse 76, temperature 98 F (36.7 C), temperature source Oral, resp. rate (!) 22, height 5' 1.5" (1.562 m), weight 61.3 kg, SpO2 96 %.        Intake/Output Summary (Last 24 hours) at 10/14/2019 1045 Last data filed at 10/14/2019 1034 Gross per 24 hour  Intake 480 ml  Output 1300 ml  Net -820 ml   Filed Weights   10/10/19 1519 10/12/19 1644  Weight: 61.3 kg 61.3 kg    Examination: General: elderly adult female sitting in bed, mild respiratory distress  HEENT: MM pink/moist Neuro: alert, oriented, follows commands  CV: Tachy, no MRG  PULM: Exp Wheeze, mild tachypnea GI: soft, bsx4 active  Extremities: warm/dry, no edema  Skin: no rashes or lesions  Resolved Hospital Problem list      Assessment & Plan:   Moderate Obstructive Lung Disease  Emphysema  Wheezing COPD  Stage IV NSCLCA, Squamous Cell  Post Nasal Drip  Discussion: Admit with worsening shortness of breath.  Baseline Stage IV NSCLC, moderate obstructive lung disease on Stiolto. She does not wear O2 at home.  No acute process noted on CXR.  Recent CT chest with moderate centrilobular emphysema.  RVP negative , COVID negative.  Consider ACE-I and Keytruda as contributing  factor, seasonal allergies.  No evidence of volume overload on exam. ? Element of upper airway syndrome with significant upper wheeze.  -RVP/COVID negative -ACE-I Stopped  Plan  -ST Evaluation Pending -continue brovana, pulmicort, yupelri  -continue solumedrol 60 mg IV Q8 -flonase for PND -hold home stiolto -wean O2 for sats 88-95%, assess ambulatory O2 needs prior to discharge  -PRN cough suppression  -CT Neck with possible laryngeal edema >> ENT consulted  -ECHO > EF 65-70 with Grade 1 Diastolic Dysfunction -Pepcid to BID for possible GERD component    OSA  Plan -CPAP QHS & PRN daytime sleep   Hx DVT  Plan -xarelto   Best practice:  Per Primary   Labs   CBC: Recent Labs  Lab 10/10/19 1151 10/11/19 0917 10/12/19 0315 10/13/19 0315 10/14/19 0415  WBC 5.3 11.9* 15.5* 10.7* 8.6  NEUTROABS 2.2  --   --   --   --   HGB 12.7 11.6* 11.7* 10.9* 11.1*  HCT 38.0 35.2* 36.1 33.2* 33.9*  MCV 100.5* 100.0 102.3* 99.7 100.3*  PLT 229 230 257 210 242    Basic Metabolic Panel: Recent Labs  Lab 10/10/19 1151 10/11/19 0917 10/12/19 0315 10/13/19 0315 10/14/19 0415  NA 139 136 138 136 136  K 4.1 3.8 4.0 4.2 4.0  CL 98 96* 98 97* 95*  CO2 29 28 30  32 31  GLUCOSE 85 180* 119* 136* 133*  BUN 23 32* 45* 43* 34*  CREATININE 0.96 1.02* 1.05* 0.94 0.89  CALCIUM 9.8 9.7 9.6 9.3 9.4   GFR: Estimated Creatinine Clearance: 48.6 mL/min (by C-G formula based on SCr of 0.89 mg/dL). Recent Labs  Lab 10/11/19 0917 10/12/19 0315 10/13/19 0315 10/14/19 0415  WBC 11.9* 15.5* 10.7* 8.6    Liver Function Tests: Recent Labs  Lab 10/11/19 0917 10/12/19 0315 10/13/19 0315 10/14/19 0415  AST 24 29 24 21   ALT 17 19 20 20   ALKPHOS 69 68 58 56  BILITOT 0.7 0.4 0.4 0.6  PROT 8.2* 7.9 7.2 6.8  ALBUMIN 4.4 4.2 3.7 3.5   No results for input(s): LIPASE, AMYLASE in the last 168 hours. No results for input(s): AMMONIA in the last 168 hours.  ABG No results found for: PHART, PCO2ART, PO2ART, HCO3, TCO2, ACIDBASEDEF, O2SAT   Coagulation Profile: No results for input(s): INR, PROTIME in the last 168 hours.  Cardiac Enzymes: No results for input(s): CKTOTAL, CKMB, CKMBINDEX, TROPONINI in the last 168 hours.  HbA1C: Hgb A1c MFr Bld  Date/Time Value Ref Range Status  10/13/2017 09:22 AM 5.9 (H) 4.8 - 5.6 % Final    Comment:             Prediabetes: 5.7 - 6.4          Diabetes: >6.4          Glycemic control for adults with diabetes: <7.0   09/23/2016 10:06 AM 5.8 (H) 4.8 - 5.6 % Final    Comment:             Pre-diabetes: 5.7 -  6.4          Diabetes: >6.4          Glycemic control for adults with diabetes: <7.0     CBG: No results for input(s): GLUCAP in the last 168 hours.   Hayden Pedro, AGACNP-BC [facility]  Pgr: [phone]  PCCM Pgr: [phone]

## 2019-10-14 NOTE — Progress Notes (Signed)
PT Cancellation Note  Patient Details Name: Debra Rodgers MRN: [id number] DOB: 1945-09-16   Cancelled Treatment:    Reason Eval/Treat Not Completed: Patient at procedure or test/unavailable (ENT)   [place] Ram E 10/14/2019, 11:07 AM Arlyce Dice, DPT Acute Rehabilitation Services Pager: [phone] Office: [phone]

## 2019-10-14 NOTE — Consult Note (Signed)
Reason for Consult:wheezing Referring Physician: hospitalist  Sandrine Bloodsworth is an 74 y.o. female.  HPI: hx of lung ca and now with breathing trouble with wheezing. A ct scan suggested there may be some laryngeal edema so need FOE. She is 100% better with sounds when on BiPAP. Her voice is normal. Swallowing good.  Past Medical History:  Diagnosis Date  . COPD (chronic obstructive pulmonary disease) ([place])   . Depression   . Essential hypertension   . Headache   . History of migraine headaches   . lung ca dx'd 10/2018  . Sleep apnea   . Tobacco use disorder     Past Surgical History:  Procedure Laterality Date  . ABDOMINAL HYSTERECTOMY    . CHOLECYSTECTOMY    . IR IMAGING GUIDED PORT INSERTION  02/24/2019  . ROTATOR CUFF REPAIR  3/04  . VIDEO BRONCHOSCOPY WITH ENDOBRONCHIAL ULTRASOUND Right 11/25/2018   Procedure: VIDEO BRONCHOSCOPY WITH ENDOBRONCHIAL ULTRASOUND;  Surgeon: Collene Gobble, MD;  Location: [facility] OR;  Service: Thoracic;  Laterality: Right;    Family History  Problem Relation Age of Onset  . Hypertension Mother   . Stroke Mother   . Coronary artery disease Mother   . Heart disease Father   . Diabetes Sister   . Hypertension Sister   . Cancer Sister   . Breast cancer Sister     Social History:  reports that she quit smoking about 11 months ago. Her smoking use included cigarettes. She started smoking about 57 years ago. She has a 56.00 pack-year smoking history. She has never used smokeless tobacco. She reports that she does not drink alcohol and does not use drugs.  Allergies:  Allergies  Allergen Reactions  . Codeine Sulfate     REACTION: "makes me high"  . Pantoprazole Sodium Swelling and Rash    facial swelling    Medications: I have reviewed the patient's current medications.  Results for orders placed or performed during the hospital encounter of 10/10/19 (from the past 48 hour(s))  CBC     Status: Abnormal   Collection Time: 10/13/19  3:15 AM  Result  Value Ref Range   WBC 10.7 (H) 4.0 - 10.5 K/uL   RBC 3.33 (L) 3.87 - 5.11 MIL/uL   Hemoglobin 10.9 (L) 12.0 - 15.0 g/dL   HCT 33.2 (L) 36 - 46 %   MCV 99.7 80.0 - 100.0 fL   MCH 32.7 26.0 - 34.0 pg   MCHC 32.8 30.0 - 36.0 g/dL   RDW 13.4 11.5 - 15.5 %   Platelets 210 150 - 400 K/uL   nRBC 0.0 0.0 - 0.2 %    Comment: Performed at [facility], [place] [address] [place], [place] [address]  Comprehensive metabolic panel     Status: Abnormal   Collection Time: 10/13/19  3:15 AM  Result Value Ref Range   Sodium 136 135 - 145 mmol/L   Potassium 4.2 3.5 - 5.1 mmol/L   Chloride 97 (L) 98 - 111 mmol/L   CO2 32 22 - 32 mmol/L   Glucose, Bld 136 (H) 70 - 99 mg/dL    Comment: Glucose reference range applies only to samples taken after fasting for at least 8 hours.   BUN 43 (H) 8 - 23 mg/dL   Creatinine, Ser 0.94 0.44 - 1.00 mg/dL   Calcium 9.3 8.9 - 10.3 mg/dL   Total Protein 7.2 6.5 - 8.1 g/dL   Albumin 3.7 3.5 - 5.0 g/dL   AST  24 15 - 41 U/L   ALT 20 0 - 44 U/L   Alkaline Phosphatase 58 38 - 126 U/L   Total Bilirubin 0.4 0.3 - 1.2 mg/dL   GFR calc non Af Amer >60 >60 mL/min   GFR calc Af Amer >60 >60 mL/min   Anion gap 7 5 - 15    Comment: Performed at [facility], [place] [address], [place], [place] [address]  CBC     Status: Abnormal   Collection Time: 10/14/19  4:15 AM  Result Value Ref Range   WBC 8.6 4.0 - 10.5 K/uL   RBC 3.38 (L) 3.87 - 5.11 MIL/uL   Hemoglobin 11.1 (L) 12.0 - 15.0 g/dL   HCT 33.9 (L) 36 - 46 %   MCV 100.3 (H) 80.0 - 100.0 fL   MCH 32.8 26.0 - 34.0 pg   MCHC 32.7 30.0 - 36.0 g/dL   RDW 13.3 11.5 - 15.5 %   Platelets 214 150 - 400 K/uL   nRBC 0.0 0.0 - 0.2 %    Comment: Performed at [facility], [place] [address] [place], [place] [address]  Comprehensive metabolic panel     Status: Abnormal   Collection Time: 10/14/19  4:15 AM  Result Value Ref Range   Sodium 136 135 - 145 mmol/L   Potassium 4.0 3.5 -  5.1 mmol/L   Chloride 95 (L) 98 - 111 mmol/L   CO2 31 22 - 32 mmol/L   Glucose, Bld 133 (H) 70 - 99 mg/dL    Comment: Glucose reference range applies only to samples taken after fasting for at least 8 hours.   BUN 34 (H) 8 - 23 mg/dL   Creatinine, Ser 0.89 0.44 - 1.00 mg/dL   Calcium 9.4 8.9 - 10.3 mg/dL   Total Protein 6.8 6.5 - 8.1 g/dL   Albumin 3.5 3.5 - 5.0 g/dL   AST 21 15 - 41 U/L   ALT 20 0 - 44 U/L   Alkaline Phosphatase 56 38 - 126 U/L   Total Bilirubin 0.6 0.3 - 1.2 mg/dL   GFR calc non Af Amer >60 >60 mL/min   GFR calc Af Amer >60 >60 mL/min   Anion gap 10 5 - 15    Comment: Performed at [facility], [place] [address], [place], [place] [address]    CT SOFT TISSUE NECK WO CONTRAST  Result Date: 10/13/2019 CLINICAL DATA:  Laryngeal spasm. Wheezing. Assess for laryngeal edema. EXAM: CT NECK WITHOUT CONTRAST TECHNIQUE: Multidetector CT imaging of the neck was performed following the standard protocol without intravenous contrast. COMPARISON:  None. FINDINGS: Pharynx and larynx: The study suffers from motion degradation, particularly in the region of the larynx. There may be a degree of voluntary glottic closure. There does not appear to be definite mucosal or submucosal edema in the region. Cannot evaluate for a small mass or polyp given the closed glottis. Salivary glands: Parotid and submandibular glands are normal. Thyroid: Normal Lymph nodes: No enlarged or low-density nodes on either side of the neck detected on this noncontrast study. Vascular: No vascular pathology evident other than aortic atherosclerosis. Port-A-Cath in place on the right. Limited intracranial: Normal Visualized orbits: Normal Mastoids and visualized paranasal sinuses: No significant sinus disease. Skeleton: Previous cervical fusion C3 through C5. Degenerative spondylosis above and below that. Prominent torus changes of maxilla in the region of the palate, more extensive on the left than the  right. Upper chest: Emphysema.  No focal  or active process. Other: None IMPRESSION: The study suffers from motion degradation. Additionally, the glottis appears to be held in likely voluntary closure. There does not appear to be gross edema of the mucosal or submucosal tissues in that region and therefore I assume the closed appearance is voluntary. If not, there could be a mild degree of laryngeal edema. No evidence of mass or lymphadenopathy. Electronically Signed   By: Nelson Chimes M.D.   On: 10/13/2019 19:20   ECHOCARDIOGRAM COMPLETE  Result Date: 10/13/2019    ECHOCARDIOGRAM REPORT   Patient Name:   NIJAH Stormont Date of Exam: 10/13/2019 Medical Rec #:  [id number]     Height:       61.5 in Accession #:    [id number]    Weight:       135.1 lb Date of Birth:  1945-05-29     BSA:          1.609 m Patient Age:    75 years      BP:           152/91 mmHg Patient Gender: F             HR:           86 bpm. Exam Location:  Inpatient Procedure: 2D Echo, Cardiac Doppler and Color Doppler Indications:    I50.23 Acute on chronic systolic (congestive) heart failure  History:        Patient has no prior history of Echocardiogram examinations.                 COPD; Risk Factors:Former Smoker, Hypertension and Sleep Apnea.  Sonographer:    Tiffany Dance Referring Phys: [place]  1. Left ventricular ejection fraction, by estimation, is 65 to 70%. The left ventricle has normal function. The left ventricle has no regional wall motion abnormalities. There is moderate focal basal septal left ventricular hypertrophy. Left ventricular  diastolic parameters are consistent with Grade I diastolic dysfunction (impaired relaxation). There was turbulence across the LV outflow tract but no significant LVOT gradient was measured.  2. Right ventricular systolic function is normal. The right ventricular size is normal. There is normal pulmonary artery systolic pressure. The estimated right ventricular systolic  pressure is 35.4 mmHg.  3. The mitral valve is normal in structure. No evidence of mitral valve regurgitation. No evidence of mitral stenosis.  4. The aortic valve is tricuspid. Aortic valve regurgitation is not visualized. No aortic stenosis is present.  5. Aortic dilatation noted. There is mild dilatation of the ascending aorta measuring 37 mm.  6. The inferior vena cava is normal in size with greater than 50% respiratory variability, suggesting right atrial pressure of 3 mmHg. FINDINGS  Left Ventricle: Left ventricular ejection fraction, by estimation, is 65 to 70%. The left ventricle has normal function. The left ventricle has no regional wall motion abnormalities. The left ventricular internal cavity size was normal in size. There is  moderate focal basal septal left ventricular hypertrophy. Left ventricular diastolic parameters are consistent with Grade I diastolic dysfunction (impaired relaxation). Right Ventricle: The right ventricular size is normal. No increase in right ventricular wall thickness. Right ventricular systolic function is normal. There is normal pulmonary artery systolic pressure. The tricuspid regurgitant velocity is 2.39 m/s, and  with an assumed right atrial pressure of 3 mmHg, the estimated right ventricular systolic pressure is 65.6 mmHg. Left Atrium: Left atrial size was normal in size. Right Atrium: Right atrial size was normal  in size. Pericardium: There is no evidence of pericardial effusion. Mitral Valve: The mitral valve is normal in structure. No evidence of mitral valve regurgitation. No evidence of mitral valve stenosis. Tricuspid Valve: The tricuspid valve is normal in structure. Tricuspid valve regurgitation is trivial. Aortic Valve: The aortic valve is tricuspid. Aortic valve regurgitation is not visualized. No aortic stenosis is present. Pulmonic Valve: The pulmonic valve was normal in structure. Pulmonic valve regurgitation is not visualized. Aorta: The aortic root is  normal in size and structure and aortic dilatation noted. There is mild dilatation of the ascending aorta measuring 37 mm. Venous: The inferior vena cava is normal in size with greater than 50% respiratory variability, suggesting right atrial pressure of 3 mmHg. IAS/Shunts: No atrial level shunt detected by color flow Doppler.  LEFT VENTRICLE PLAX 2D LVIDd:         3.70 cm  Diastology LVIDs:         2.50 cm  LV e' lateral:   7.29 cm/s LV PW:         1.00 cm  LV E/e' lateral: 9.7 LV IVS:        1.40 cm  LV e' medial:    5.66 cm/s LVOT diam:     1.80 cm  LV E/e' medial:  12.4 LV SV:         86 LV SV Index:   53 LVOT Area:     2.54 cm  RIGHT VENTRICLE             IVC RV Basal diam:  2.40 cm     IVC diam: 1.90 cm RV S prime:     20.30 cm/s TAPSE (M-mode): 1.9 cm LEFT ATRIUM             Index       RIGHT ATRIUM          Index LA diam:        2.70 cm 1.68 cm/m  RA Area:     9.46 cm LA Vol (A2C):   61.4 ml 38.17 ml/m RA Volume:   16.90 ml 10.51 ml/m LA Vol (A4C):   43.5 ml 27.04 ml/m LA Biplane Vol: 51.5 ml 32.01 ml/m  AORTIC VALVE LVOT Vmax:   179.00 cm/s LVOT Vmean:  113.000 cm/s LVOT VTI:    0.338 m  AORTA Ao Root diam: 3.40 cm Ao Asc diam:  3.70 cm MITRAL VALVE               TRICUSPID VALVE MV Area (PHT): 2.32 cm    TR Peak grad:   22.8 mmHg MV Decel Time: 327 msec    TR Vmax:        239.00 cm/s MV E velocity: 70.40 cm/s MV A velocity: 84.60 cm/s  SHUNTS MV E/A ratio:  0.83        Systemic VTI:  0.34 m                            Systemic Diam: 1.80 cm Loralie Champagne MD Electronically signed by Loralie Champagne MD Signature Date/Time: 10/13/2019/5:05:21 PM    Final     Review of Systems Blood pressure (!) 142/78, pulse 76, temperature 98 F (36.7 C), temperature source Oral, resp. rate (!) 22, height 5' 1.5" (1.562 m), weight 61.3 kg, SpO2 93 %. Physical Exam Constitutional:      Appearance: Normal appearance.  HENT:     Head: Normocephalic and atraumatic.  Comments: FOE- the nasopharynx is normal. The  larynx is without any lesions or swelling. Both vocal cords normal and move normally. I can hear the sound of the wheezing and it is obviously from below the cords likely in the lungs. There is no evidence of obstruction in the upper airway to explain the noise.    Nose: Nose normal.     Mouth/Throat:     Mouth: Mucous membranes are moist.  Eyes:     Pupils: Pupils are equal, round, and reactive to light.  Neurological:     Mental Status: She is alert.     Assessment/Plan: Wheezing- the scope did not show any evidence that the airway issues are upper airway. The vocal cords and rest of the larynx are completely normal. The sound of wheezing is present with a wide open glottis by exam so it is not vocal cord dysfunction. Call further any further needs.   Melissa Montane 10/14/2019, 12:01 PM

## 2019-10-15 LAB — COMPREHENSIVE METABOLIC PANEL
ALT: 25 U/L (ref 0–44)
AST: 22 U/L (ref 15–41)
Albumin: 3.5 g/dL (ref 3.5–5.0)
Alkaline Phosphatase: 57 U/L (ref 38–126)
Anion gap: 11 (ref 5–15)
BUN: 32 mg/dL — ABNORMAL HIGH (ref 8–23)
CO2: 30 mmol/L (ref 22–32)
Calcium: 9.1 mg/dL (ref 8.9–10.3)
Chloride: 94 mmol/L — ABNORMAL LOW (ref 98–111)
Creatinine, Ser: 0.78 mg/dL (ref 0.44–1.00)
GFR calc Af Amer: >60 mL/min (ref >60)
GFR calc non Af Amer: >60 mL/min (ref >60)
Glucose, Bld: 142 mg/dL — ABNORMAL HIGH (ref 70–99)
Potassium: 3.4 mmol/L — ABNORMAL LOW (ref 3.5–5.1)
Sodium: 135 mmol/L (ref 135–145)
Total Bilirubin: 0.5 mg/dL (ref 0.3–1.2)
Total Protein: 6.4 g/dL — ABNORMAL LOW (ref 6.5–8.1)

## 2019-10-15 LAB — CBC
HCT: 34.2 % — ABNORMAL LOW (ref 36.0–46.0)
Hemoglobin: 11.4 g/dL — ABNORMAL LOW (ref 12.0–15.0)
MCH: 33.1 pg (ref 26.0–34.0)
MCHC: 33.3 g/dL (ref 30.0–36.0)
MCV: 99.4 fL (ref 80.0–100.0)
Platelets: 223 10*3/uL (ref 150–400)
RBC: 3.44 MIL/uL — ABNORMAL LOW (ref 3.87–5.11)
RDW: 13.2 % (ref 11.5–15.5)
WBC: 7.4 10*3/uL (ref 4.0–10.5)
nRBC: 0 % (ref 0.0–0.2)

## 2019-10-15 LAB — MAGNESIUM: Magnesium: 2 mg/dL (ref 1.7–2.4)

## 2019-10-15 MED ORDER — METHYLPREDNISOLONE SODIUM SUCC 40 MG IJ SOLR
40.0000 mg | Freq: Two times a day (BID) | INTRAMUSCULAR | Status: DC
  Administered 2019-10-15 – 2019-10-16 (×2): 40 mg via INTRAVENOUS
  Filled 2019-10-15 (×2): qty 1

## 2019-10-15 MED ORDER — POTASSIUM CHLORIDE 20 MEQ PO PACK
40.0000 meq | PACK | ORAL | Status: AC
  Administered 2019-10-15 (×2): 40 meq via ORAL
  Filled 2019-10-15 (×2): qty 2

## 2019-10-15 MED ORDER — FUROSEMIDE 10 MG/ML IJ SOLN
40.0000 mg | Freq: Once | INTRAMUSCULAR | Status: AC
  Administered 2019-10-15: 40 mg via INTRAVENOUS
  Filled 2019-10-15: qty 4

## 2019-10-15 NOTE — Progress Notes (Signed)
Physical Therapy Treatment Patient Details Name: Debra Rodgers MRN: [id number] DOB: 05/20/1945 Today's Date: 10/15/2019    History of Present Illness 74 y.o. year old female with medical history significant for NSCLC currently undergoing chemotherapy, HTN, emphysema who presented on 10/10/2019 with worsening shortness of breath, productive cough and wheezing with minimal improvement with home inhalers and was found to have acute hypoxic respiratory failure presumed secondary to COPD exacerbation.    PT Comments    Pt ambulated in hallway and tolerated good distance.  Pt also able to ambulate on room air.  Pt reports overall feeling much better.   Follow Up Recommendations  Home health PT     Equipment Recommendations  None recommended by PT    Recommendations for Other Services       Precautions / Restrictions Precautions Precautions: Fall Precaution Comments: monitor sats    Mobility  Bed Mobility Overal bed mobility: Modified Independent                Transfers Overall transfer level: Needs assistance Equipment used: None Transfers: Sit to/from Stand Sit to Stand: Min guard            Ambulation/Gait Ambulation/Gait assistance: Min guard Gait Distance (Feet): 300 Feet Assistive device: None Gait Pattern/deviations: Step-through pattern;Decreased stride length     General Gait Details: Spo2 remained 93-96% during ambulation on room air, briefly dropped to 89% upon sitting in recliner however quickly improved to 93-95% room air prior to leaving room (RN aware of sats and pt left off oxygen)   Stairs             Wheelchair Mobility    Modified Rankin (Stroke Patients Only)       Balance                                            Cognition Arousal/Alertness: Awake/alert Behavior During Therapy: WFL for tasks assessed/performed Overall Cognitive Status: Within Functional Limits for tasks assessed                                         Exercises      General Comments        Pertinent Vitals/Pain Pain Assessment: No/denies pain    Home Living                      Prior Function            PT Goals (current goals can now be found in the care plan section) Progress towards PT goals: Progressing toward goals    Frequency    Min 3X/week      PT Plan Current plan remains appropriate    Co-evaluation              AM-PAC PT "6 Clicks" Mobility   Outcome Measure  Help needed turning from your back to your side while in a flat bed without using bedrails?: A Little Help needed moving from lying on your back to sitting on the side of a flat bed without using bedrails?: A Little Help needed moving to and from a bed to a chair (including a wheelchair)?: A Little Help needed standing up from a chair using your arms (e.g., wheelchair or bedside chair)?: A Little Help  needed to walk in hospital room?: A Little Help needed climbing 3-5 steps with a railing? : A Little 6 Click Score: 18    End of Session   Activity Tolerance: Patient tolerated treatment well Patient left: in chair;with call bell/phone within reach Nurse Communication: Mobility status PT Visit Diagnosis: Difficulty in walking, not elsewhere classified (R26.2)     Time: 6161-2240 PT Time Calculation (min) (ACUTE ONLY): 16 min  Charges:  $Gait Training: 8-22 mins                    Arlyce Dice, DPT Acute Rehabilitation Services Pager: [phone] Office: [place] E 10/15/2019, 1:43 PM

## 2019-10-15 NOTE — Care Plan (Signed)
Pt is alert and aware sitting up in chair. She has ordered her lunch. She is or has been up walking around trying to built up strength.   The chaplain offered caring and supportive presence, prayers and blessings. I sang with pt, further visits will be offered.

## 2019-10-15 NOTE — Progress Notes (Signed)
NAME:  Debra Rodgers, MRN:  [id number], DOB:  Nov 09, 1945, LOS: 5 ADMISSION DATE:  10/10/2019, CONSULTATION DATE:  7/19 REFERRING MD:  Dr. Avon Gully, CHIEF COMPLAINT:  Shortness of Breath    Brief History   74 y/o F, former smoker, admitted 7/18 with worsening shortness of breath.   The patient is a former smoker (quit in 10/2018).  She was diagnosed with Stage IV NSCLC, squamous cell carcinoma in 10/2018 with RUL lung mass in addition to pleural based metastasis with mediastinal lymphadenopathy.  She has been treated with carboplatin, paclitaxel and Keytruda.  She is currently on Keytruda Q3 weeks.  She was seen at the [facility] on 7/7 with plans to proceed with next cycle of Keytruda and with follow up imaging.    She reports she has been outside and thinks the heat has made her more short of breath.  She has noted increased nasal drainage, cough with clear sputum production, shortness of breath, wheezing.  She denies fevers, chills, N/V,D.  Reports compliance with medications at home.  She is not on O2 at baseline and prior to this admit was independent of all ADL's.  She notes she visited a house recently to pick up furniture.  COVID testing negative on presentation.  She was treated initially with albuterol, atrovent, steroids and O2. CXR negative for acute process. She is followed by Dr. Lamonte Sakai and is on [place].  She recently completed a taper of steroids as outpatient.  Of note, on medication review, she is on an ACE-I.    PCCM consulted for evaluation.    Past Medical History  COPD - PFT in 2017 with <6 second effort, estimated moderate airway obstruction, no BD response  Tobacco Abuse Lung Cancer - diagnosed 10/2018 DVT - on Xarelto  OSA  HTN  Depression   Significant Hospital Events   7/18 Admit  7/19 PCCM consulted 7/20 Feels some better but wheezing not improved  Consults:  Pulmonary   Procedures:    Significant Diagnostic Tests:     Micro Data:  COVID 7/18 >  negative  RVP 7/19 > Negative   Antimicrobials:    Interim history/subjective:  Patient reports overall improvement in breathing. Denies Nausea/Vomiting   Objective   Blood pressure (!) 148/84, pulse 77, temperature 98.4 F (36.9 C), temperature source Oral, resp. rate 17, height 5' 1.5" (1.562 m), weight 61.3 kg, SpO2 100 %.        Intake/Output Summary (Last 24 hours) at 10/15/2019 1158 Last data filed at 10/15/2019 1000 Gross per 24 hour  Intake 360 ml  Output 400 ml  Net -40 ml   Filed Weights   10/10/19 1519 10/12/19 1644  Weight: 61.3 kg 61.3 kg    Examination: General: elderly adult female sitting in chair, no distress  HEENT: MM pink/moist Neuro: alert, oriented, follows commands  CV: Tachy, no MRG  PULM: Exp Wheeze > Much improved from previous exam, Crackles to bases, no accessory muscle use noted  GI: soft, bsx4 active  Extremities: warm/dry, no edema  Skin: no rashes or lesions  Resolved Hospital Problem list      Assessment & Plan:   Moderate Obstructive Lung Disease  Emphysema  Wheezing COPD  Stage IV NSCLCA, Squamous Cell  Post Nasal Drip  Discussion: Admit with worsening shortness of breath.  Baseline Stage IV NSCLC, moderate obstructive lung disease on Stiolto. She does not wear O2 at home. Recent CT chest with moderate centrilobular emphysema.  -RVP/COVID negative -ACE-I Stopped  Plan  -  ENT Consulted 7/22 due to concern for upper airway wheeze with possible laryngeal edema > underwent scope > noted to have normal vocal cord function with no noted edema  -continue brovana, pulmicort, yupelri  -continue solumedrol 60 mg IV Q8 >> Decrease to 40 mg BID  -flonase for PND -hold home stiolto -Currently on room air, titrate supplemental oxygen for saturation goal >90  -PRN cough suppression  -Pepcid to BID for possible GERD component  -Give 40 mg lasix x 1, noted to crackles to bases   OSA  Plan -CPAP QHS & PRN daytime sleep   Hx DVT   Plan -xarelto   Best practice:  Per Primary   Labs   CBC: Recent Labs  Lab 10/10/19 1151 10/10/19 1151 10/11/19 0917 10/12/19 0315 10/13/19 0315 10/14/19 0415 10/15/19 0308  WBC 5.3   < > 11.9* 15.5* 10.7* 8.6 7.4  NEUTROABS 2.2  --   --   --   --   --   --   HGB 12.7   < > 11.6* 11.7* 10.9* 11.1* 11.4*  HCT 38.0   < > 35.2* 36.1 33.2* 33.9* 34.2*  MCV 100.5*   < > 100.0 102.3* 99.7 100.3* 99.4  PLT 229   < > 230 257 210 214 223   < > = values in this interval not displayed.    Basic Metabolic Panel: Recent Labs  Lab 10/11/19 0917 10/12/19 0315 10/13/19 0315 10/14/19 0415 10/15/19 0308 10/15/19 0405  NA 136 138 136 136 135  --   K 3.8 4.0 4.2 4.0 3.4*  --   CL 96* 98 97* 95* 94*  --   CO2 28 30 32 31 30  --   GLUCOSE 180* 119* 136* 133* 142*  --   BUN 32* 45* 43* 34* 32*  --   CREATININE 1.02* 1.05* 0.94 0.89 0.78  --   CALCIUM 9.7 9.6 9.3 9.4 9.1  --   MG  --   --   --   --   --  2.0   GFR: Estimated Creatinine Clearance: 54.1 mL/min (by C-G formula based on SCr of 0.78 mg/dL). Recent Labs  Lab 10/12/19 0315 10/13/19 0315 10/14/19 0415 10/15/19 0308  WBC 15.5* 10.7* 8.6 7.4    Liver Function Tests: Recent Labs  Lab 10/11/19 0917 10/12/19 0315 10/13/19 0315 10/14/19 0415 10/15/19 0308  AST 24 29 24 21 22   ALT 17 19 20 20 25   ALKPHOS 69 68 58 56 57  BILITOT 0.7 0.4 0.4 0.6 0.5  PROT 8.2* 7.9 7.2 6.8 6.4*  ALBUMIN 4.4 4.2 3.7 3.5 3.5   No results for input(s): LIPASE, AMYLASE in the last 168 hours. No results for input(s): AMMONIA in the last 168 hours.  ABG No results found for: PHART, PCO2ART, PO2ART, HCO3, TCO2, ACIDBASEDEF, O2SAT   Coagulation Profile: No results for input(s): INR, PROTIME in the last 168 hours.  Cardiac Enzymes: No results for input(s): CKTOTAL, CKMB, CKMBINDEX, TROPONINI in the last 168 hours.  HbA1C: Hgb A1c MFr Bld  Date/Time Value Ref Range Status  10/13/2017 09:22 AM 5.9 (H) 4.8 - 5.6 % Final    Comment:              Prediabetes: 5.7 - 6.4          Diabetes: >6.4          Glycemic control for adults with diabetes: <7.0   09/23/2016 10:06 AM 5.8 (H) 4.8 - 5.6 % Final  Comment:             Pre-diabetes: 5.7 - 6.4          Diabetes: >6.4          Glycemic control for adults with diabetes: <7.0     CBG: No results for input(s): GLUCAP in the last 168 hours.   Hayden Pedro, AGACNP-BC [facility]  Pgr: [phone]  PCCM Pgr: [phone]

## 2019-10-15 NOTE — Plan of Care (Signed)

## 2019-10-15 NOTE — Progress Notes (Signed)
[facility]  PROGRESS NOTE  Debra Rodgers PFX:[id number] DOB: 27-May-1945 DOA: 10/10/2019 PCP: [name], MD Admit date - 10/10/2019   Admitting Physician Donne Hazel, MD  Outpatient Primary MD for the patient is [name], MD  LOS - 5 Brief Narrative   Debra Rodgers is a 74 y.o. year old female with medical history significant for NSCLC currently undergoing chemotherapy, HTN, emphysema who presented on 10/10/2019 with worsening shortness of breath, productive cough and wheezing with minimal improvement with home inhalers and was found to have acute hypoxic respiratory failure presumed secondary to COPD exacerbation.  CT soft tissue neck showed possible laryngeal edema.  Bedside evaluation by ENT with scope showed no evidence of upper airway pathology or vocal cord dysfunction.  TTE showed diastolic dysfunction, with preserved EF.   Subjective  Today continues to report improvement in breathing, still has cough slightly productive of clear sputum.  Denies fevers or chills.  A & P   Acute hypoxic respiratory failure,  improving today.  Still has wheezing in all lung fields but quite improved in the last 24 to 48 hours, only mild conversational dyspnea noted.  Down to 2 L O2 (on no oxygen at home) presume been mostly driven by COPD exacerbation (  Differential also includes v, side effect from lisinopril, acid reflux, progression of known NSCLC. COVID test neg -Speech to evaluate O2 ambulatory testing -Appreciate PCCM recommendations, decrease IV Solu-Medrol to 40 mg twice daily, one-time dose of IV Lasix 40 mg x 1 due to noted crackles on examination, line -Pulmicort, scheduled albuterol nebs, Brovana,  -Pepcid twice daily -Wean O2 as able, SPO2 goal greater than 88% given emphysema  COPD with acute exacerbation.  Has emphysema.  Persistent wheeze and cough, unclear if only related to COPD or some other etiologies mentioned above -Continue scheduled IV  Solu-Medrol decreased to 40 mg twice daily per PCCM recommendations, scheduled albuterol nebs, Brovana, Pulmicort, revefenacin nebs -Quit smoking a year ago  Leukemoid reaction related to IV steroids, resolved.  Has remained afebrile -Continue to monitor  HTN, stable -continue amlodipine -lisinopril discontinued in setting of chronic cough/wheeze  Stage IV non-small cell lung cancer, squamous cell carcinoma of the right lung September 2020 CTA chest 08/24/2019 showed stable pulmonary nodules -Undergoing outpatient chemotherapy with Dr. Earlie Server  History of DVT, POA -Home Xarelto  Descending thoracic aortic, aneurysmal dilatation.  3.8 cm in diameter on CT imaging 08/24/2019 -need outpatient follow-up for reimaging  Hyperlipidemia, stable -Home Lipitor  Mood disorder, stable -continue home Prozac  OSA, stable-continue home CPAP  Family Communication  : None  Code Status : Full  Disposition Plan  :  Patient is from home. Anticipated d/c date: 2 to 3 days. Barriers to d/c or necessity for inpatient status:  Closely monitor respiratory status while weaning IV Solu-Medrol, needing IV Lasix Consults  : PCCM, ENT  Procedures  : TTE, 7/21   DVT Prophylaxis  : Home Xarelto  Lab Results  Component Value Date   PLT 223 10/15/2019    Diet :  Diet Order            Diet regular Room service appropriate? Yes; Fluid consistency: Thin  Diet effective now                  Inpatient Medications Scheduled Meds:  albuterol  2.5 mg Nebulization Q4H WA   amLODipine  10 mg Oral Daily   arformoterol  15 mcg Nebulization BID   atorvastatin  20  mg Oral Daily   budesonide (PULMICORT) nebulizer solution  0.5 mg Nebulization BID   Chlorhexidine Gluconate Cloth  6 each Topical Daily   chlorthalidone  50 mg Oral Daily   famotidine  20 mg Oral BID   FLUoxetine  20 mg Oral Daily   fluticasone  1 spray Each Nare Daily   methylPREDNISolone (SOLU-MEDROL) injection  40 mg  Intravenous Q12H   revefenacin  175 mcg Nebulization Daily   rivaroxaban  20 mg Oral Q supper   sodium chloride flush  10-40 mL Intracatheter Q12H   Continuous Infusions: PRN Meds:.acetaminophen **OR** acetaminophen, albuterol, benzonatate, chlorpheniramine-HYDROcodone, guaiFENesin-dextromethorphan, HYDROcodone-acetaminophen, phenol, sodium chloride flush  Antibiotics  :   Anti-infectives (From admission, onward)   None       Objective   Vitals:   10/15/19 0920 10/15/19 1201 10/15/19 1308 10/15/19 1623  BP: (!) 148/84  115/75   Pulse: 77  92   Resp:      Temp: 98.4 F (36.9 C)  98.4 F (36.9 C)   TempSrc: Oral  Oral   SpO2: 100% 95% 98% 95%  Weight:      Height:        SpO2: 95 % O2 Flow Rate (L/min): 3 L/min  Wt Readings from Last 3 Encounters:  10/12/19 61.3 kg  09/29/19 63.1 kg  09/08/19 62.9 kg     Intake/Output Summary (Last 24 hours) at 10/15/2019 1846 Last data filed at 10/15/2019 1200 Gross per 24 hour  Intake 720 ml  Output 400 ml  Net 320 ml    Physical Exam:     Awake Alert, Oriented X 3, Normal affect No new F.N deficits,  [place].AT, Diffuse wheezing heard in all lung fields but noticeably improved in the last 24 hours,  minimal conversational dyspnea present, no accessory muscle usage, on 2 L nasal cannula +ve B.Sounds, Abd Soft, No tenderness, No rebound, guarding or rigidity. No Cyanosis, No new Rash or bruise     I have personally reviewed the following:   Data Reviewed:  CBC Recent Labs  Lab 10/10/19 1151 10/10/19 1151 10/11/19 0917 10/12/19 0315 10/13/19 0315 10/14/19 0415 10/15/19 0308  WBC 5.3   < > 11.9* 15.5* 10.7* 8.6 7.4  HGB 12.7   < > 11.6* 11.7* 10.9* 11.1* 11.4*  HCT 38.0   < > 35.2* 36.1 33.2* 33.9* 34.2*  PLT 229   < > 230 257 210 214 223  MCV 100.5*   < > 100.0 102.3* 99.7 100.3* 99.4  MCH 33.6   < > 33.0 33.1 32.7 32.8 33.1  MCHC 33.4   < > 33.0 32.4 32.8 32.7 33.3  RDW 13.2   < > 13.2 13.4 13.4 13.3 13.2   LYMPHSABS 1.2  --   --   --   --   --   --   MONOABS 0.6  --   --   --   --   --   --   EOSABS 1.2*  --   --   --   --   --   --   BASOSABS 0.1  --   --   --   --   --   --    < > = values in this interval not displayed.    Chemistries  Recent Labs  Lab 10/11/19 0917 10/12/19 0315 10/13/19 0315 10/14/19 0415 10/15/19 0308 10/15/19 0405  NA 136 138 136 136 135  --   K 3.8 4.0 4.2 4.0 3.4*  --  CL 96* 98 97* 95* 94*  --   CO2 28 30 32 31 30  --   GLUCOSE 180* 119* 136* 133* 142*  --   BUN 32* 45* 43* 34* 32*  --   CREATININE 1.02* 1.05* 0.94 0.89 0.78  --   CALCIUM 9.7 9.6 9.3 9.4 9.1  --   MG  --   --   --   --   --  2.0  AST 24 29 24 21 22   --   ALT 17 19 20 20 25   --   ALKPHOS 69 68 58 56 57  --   BILITOT 0.7 0.4 0.4 0.6 0.5  --    ------------------------------------------------------------------------------------------------------------------ No results for input(s): CHOL, HDL, LDLCALC, TRIG, CHOLHDL, LDLDIRECT in the last 72 hours.  Lab Results  Component Value Date   HGBA1C 5.9 (H) 10/13/2017   ------------------------------------------------------------------------------------------------------------------ No results for input(s): TSH, T4TOTAL, T3FREE, THYROIDAB in the last 72 hours.  Invalid input(s): FREET3 ------------------------------------------------------------------------------------------------------------------ No results for input(s): VITAMINB12, FOLATE, FERRITIN, TIBC, IRON, RETICCTPCT in the last 72 hours.  Coagulation profile No results for input(s): INR, PROTIME in the last 168 hours.  No results for input(s): DDIMER in the last 72 hours.  Cardiac Enzymes No results for input(s): CKMB, TROPONINI, MYOGLOBIN in the last 168 hours.  Invalid input(s): CK ------------------------------------------------------------------------------------------------------------------    Component Value Date/Time   BNP 20.5 10/10/2019 1151    Micro  Results Recent Results (from the past 240 hour(s))  SARS Coronavirus 2 by RT PCR (hospital order, performed in [facility] hospital lab) Nasopharyngeal Nasopharyngeal Swab     Status: None   Collection Time: 10/10/19 11:51 AM   Specimen: Nasopharyngeal Swab  Result Value Ref Range Status   SARS Coronavirus 2 NEGATIVE NEGATIVE Final    Comment: (NOTE) SARS-CoV-2 target nucleic acids are NOT DETECTED.  The SARS-CoV-2 RNA is generally detectable in upper and lower respiratory specimens during the acute phase of infection. The lowest concentration of SARS-CoV-2 viral copies this assay can detect is 250 copies / mL. A negative result does not preclude SARS-CoV-2 infection and should not be used as the sole basis for treatment or other patient management decisions.  A negative result may occur with improper specimen collection / handling, submission of specimen other than nasopharyngeal swab, presence of viral mutation(s) within the areas targeted by this assay, and inadequate number of viral copies (<250 copies / mL). A negative result must be combined with clinical observations, patient history, and epidemiological information.  Fact Sheet for Patients:   StrictlyIdeas.no  Fact Sheet for Healthcare Providers: BankingDealers.co.za  This test is not yet approved or  cleared by the [place] FDA and has been authorized for detection and/or diagnosis of SARS-CoV-2 by FDA under an Emergency Use Authorization (EUA).  This EUA will remain in effect (meaning this test can be used) for the duration of the COVID-19 declaration under Section 564(b)(1) of the Act, 21 U.S.C. section 360bbb-3(b)(1), unless the authorization is terminated or revoked sooner.  Performed at [facility], [place] [address] [place], [place] [address]   Respiratory Panel by PCR     Status: None   Collection Time: 10/11/19  1:42 PM   Specimen:  Nasopharyngeal Swab; Respiratory  Result Value Ref Range Status   Adenovirus NOT DETECTED NOT DETECTED Final   Coronavirus 229E NOT DETECTED NOT DETECTED Final    Comment: (NOTE) The Coronavirus on the Respiratory Panel, DOES NOT test for the novel  Coronavirus (2019 nCoV)  Coronavirus HKU1 NOT DETECTED NOT DETECTED Final   Coronavirus NL63 NOT DETECTED NOT DETECTED Final   Coronavirus OC43 NOT DETECTED NOT DETECTED Final   Metapneumovirus NOT DETECTED NOT DETECTED Final   Rhinovirus / Enterovirus NOT DETECTED NOT DETECTED Final   Influenza A NOT DETECTED NOT DETECTED Final   Influenza B NOT DETECTED NOT DETECTED Final   Parainfluenza Virus 1 NOT DETECTED NOT DETECTED Final   Parainfluenza Virus 2 NOT DETECTED NOT DETECTED Final   Parainfluenza Virus 3 NOT DETECTED NOT DETECTED Final   Parainfluenza Virus 4 NOT DETECTED NOT DETECTED Final   Respiratory Syncytial Virus NOT DETECTED NOT DETECTED Final   Bordetella pertussis NOT DETECTED NOT DETECTED Final   Chlamydophila pneumoniae NOT DETECTED NOT DETECTED Final   Mycoplasma pneumoniae NOT DETECTED NOT DETECTED Final    Comment: Performed at [facility], [place] [address] [place], [place] [address]    Radiology Reports DG Chest 2 View  Result Date: 10/10/2019 CLINICAL DATA:  Shortness of breath and COPD. EXAM: CHEST - 2 VIEW COMPARISON:  Aug 23, 2019 FINDINGS: Injectable port in stable position. Mildly enlarged cardiac silhouette. Aneurysmal dilation of the aorta better demonstrated on recent chest CT. Mild coarsening of the interstitial markings. No focal airspace consolidation. Chronic elevation of the left hemidiaphragm. Osseous structures are without acute abnormality. Soft tissues are grossly normal. IMPRESSION: 1. Mild coarsening of the interstitial markings. 2. No focal airspace consolidation. 3. Aneurysmal dilation of the aorta better demonstrated on recent chest CT. Electronically Signed   By: Fidela Salisbury M.D.    On: 10/10/2019 12:45   CT SOFT TISSUE NECK WO CONTRAST  Result Date: 10/13/2019 CLINICAL DATA:  Laryngeal spasm. Wheezing. Assess for laryngeal edema. EXAM: CT NECK WITHOUT CONTRAST TECHNIQUE: Multidetector CT imaging of the neck was performed following the standard protocol without intravenous contrast. COMPARISON:  None. FINDINGS: Pharynx and larynx: The study suffers from motion degradation, particularly in the region of the larynx. There may be a degree of voluntary glottic closure. There does not appear to be definite mucosal or submucosal edema in the region. Cannot evaluate for a small mass or polyp given the closed glottis. Salivary glands: Parotid and submandibular glands are normal. Thyroid: Normal Lymph nodes: No enlarged or low-density nodes on either side of the neck detected on this noncontrast study. Vascular: No vascular pathology evident other than aortic atherosclerosis. Port-A-Cath in place on the right. Limited intracranial: Normal Visualized orbits: Normal Mastoids and visualized paranasal sinuses: No significant sinus disease. Skeleton: Previous cervical fusion C3 through C5. Degenerative spondylosis above and below that. Prominent torus changes of maxilla in the region of the palate, more extensive on the left than the right. Upper chest: Emphysema.  No focal or active process. Other: None IMPRESSION: The study suffers from motion degradation. Additionally, the glottis appears to be held in likely voluntary closure. There does not appear to be gross edema of the mucosal or submucosal tissues in that region and therefore I assume the closed appearance is voluntary. If not, there could be a mild degree of laryngeal edema. No evidence of mass or lymphadenopathy. Electronically Signed   By: Nelson Chimes M.D.   On: 10/13/2019 19:20   ECHOCARDIOGRAM COMPLETE  Result Date: 10/13/2019    ECHOCARDIOGRAM REPORT   Patient Name:   Debra Rodgers Date of Exam: 10/13/2019 Medical Rec #:  [id number]      Height:       61.5 in Accession #:    [id number]  Weight:       135.1 lb Date of Birth:  07-06-1945     BSA:          1.609 m Patient Age:    39 years      BP:           152/91 mmHg Patient Gender: F             HR:           86 bpm. Exam Location:  Inpatient Procedure: 2D Echo, Cardiac Doppler and Color Doppler Indications:    I50.23 Acute on chronic systolic (congestive) heart failure  History:        Patient has no prior history of Echocardiogram examinations.                 COPD; Risk Factors:Former Smoker, Hypertension and Sleep Apnea.  Sonographer:    Tiffany Dance Referring Phys: [place]  1. Left ventricular ejection fraction, by estimation, is 65 to 70%. The left ventricle has normal function. The left ventricle has no regional wall motion abnormalities. There is moderate focal basal septal left ventricular hypertrophy. Left ventricular  diastolic parameters are consistent with Grade I diastolic dysfunction (impaired relaxation). There was turbulence across the LV outflow tract but no significant LVOT gradient was measured.  2. Right ventricular systolic function is normal. The right ventricular size is normal. There is normal pulmonary artery systolic pressure. The estimated right ventricular systolic pressure is 87.5 mmHg.  3. The mitral valve is normal in structure. No evidence of mitral valve regurgitation. No evidence of mitral stenosis.  4. The aortic valve is tricuspid. Aortic valve regurgitation is not visualized. No aortic stenosis is present.  5. Aortic dilatation noted. There is mild dilatation of the ascending aorta measuring 37 mm.  6. The inferior vena cava is normal in size with greater than 50% respiratory variability, suggesting right atrial pressure of 3 mmHg. FINDINGS  Left Ventricle: Left ventricular ejection fraction, by estimation, is 65 to 70%. The left ventricle has normal function. The left ventricle has no regional wall motion abnormalities. The left  ventricular internal cavity size was normal in size. There is  moderate focal basal septal left ventricular hypertrophy. Left ventricular diastolic parameters are consistent with Grade I diastolic dysfunction (impaired relaxation). Right Ventricle: The right ventricular size is normal. No increase in right ventricular wall thickness. Right ventricular systolic function is normal. There is normal pulmonary artery systolic pressure. The tricuspid regurgitant velocity is 2.39 m/s, and  with an assumed right atrial pressure of 3 mmHg, the estimated right ventricular systolic pressure is 64.3 mmHg. Left Atrium: Left atrial size was normal in size. Right Atrium: Right atrial size was normal in size. Pericardium: There is no evidence of pericardial effusion. Mitral Valve: The mitral valve is normal in structure. No evidence of mitral valve regurgitation. No evidence of mitral valve stenosis. Tricuspid Valve: The tricuspid valve is normal in structure. Tricuspid valve regurgitation is trivial. Aortic Valve: The aortic valve is tricuspid. Aortic valve regurgitation is not visualized. No aortic stenosis is present. Pulmonic Valve: The pulmonic valve was normal in structure. Pulmonic valve regurgitation is not visualized. Aorta: The aortic root is normal in size and structure and aortic dilatation noted. There is mild dilatation of the ascending aorta measuring 37 mm. Venous: The inferior vena cava is normal in size with greater than 50% respiratory variability, suggesting right atrial pressure of 3 mmHg. IAS/Shunts: No atrial level shunt detected by  color flow Doppler.  LEFT VENTRICLE PLAX 2D LVIDd:         3.70 cm  Diastology LVIDs:         2.50 cm  LV e' lateral:   7.29 cm/s LV PW:         1.00 cm  LV E/e' lateral: 9.7 LV IVS:        1.40 cm  LV e' medial:    5.66 cm/s LVOT diam:     1.80 cm  LV E/e' medial:  12.4 LV SV:         86 LV SV Index:   53 LVOT Area:     2.54 cm  RIGHT VENTRICLE             IVC RV Basal diam:   2.40 cm     IVC diam: 1.90 cm RV S prime:     20.30 cm/s TAPSE (M-mode): 1.9 cm LEFT ATRIUM             Index       RIGHT ATRIUM          Index LA diam:        2.70 cm 1.68 cm/m  RA Area:     9.46 cm LA Vol (A2C):   61.4 ml 38.17 ml/m RA Volume:   16.90 ml 10.51 ml/m LA Vol (A4C):   43.5 ml 27.04 ml/m LA Biplane Vol: 51.5 ml 32.01 ml/m  AORTIC VALVE LVOT Vmax:   179.00 cm/s LVOT Vmean:  113.000 cm/s LVOT VTI:    0.338 m  AORTA Ao Root diam: 3.40 cm Ao Asc diam:  3.70 cm MITRAL VALVE               TRICUSPID VALVE MV Area (PHT): 2.32 cm    TR Peak grad:   22.8 mmHg MV Decel Time: 327 msec    TR Vmax:        239.00 cm/s MV E velocity: 70.40 cm/s MV A velocity: 84.60 cm/s  SHUNTS MV E/A ratio:  0.83        Systemic VTI:  0.34 m                            Systemic Diam: 1.80 cm Loralie Champagne MD Electronically signed by Loralie Champagne MD Signature Date/Time: 10/13/2019/5:05:21 PM    Final      Time Spent in minutes  30     Desiree Hane M.D on 10/15/2019 at 6:46 PM  To page go to [email or web address] - password [facility]

## 2019-10-15 NOTE — TOC Progression Note (Signed)
Transition of Care [facility]) - Progression Note    Patient Details  Name: Debra Rodgers MRN: [id number] Date of Birth: 27-Dec-1945  Transition of Care [facility]) CM/SW Contact  Breona Cherubin, Juliann Pulse, RN Phone Number: 10/15/2019, 12:16 PM  Clinical Narrative: PT recc HHPT-patient agree to [facility] for HHPT-rep [facility] following.      Expected Discharge Plan: [place] Barriers to Discharge: Continued Medical Work up  Expected Discharge Plan and Services Expected Discharge Plan: [place]   Discharge Planning Services: CM Consult   Living arrangements for the past 2 months: Single Family Home                           HH Arranged: PT [place]: [place] Date [facility] Agency Contacted: 10/15/19 Time HH Agency Contacted: 1216 Representative spoke with at [place]: [place] ([place]) Interventions    Readmission Risk Interventions Readmission Risk Prevention Plan 10/12/2019  Transportation Screening Complete  PCP or Specialist Appt within 3-5 Days Complete  HRI or [place] Complete  Social Work Consult for [place] Planning/Counseling Complete  Palliative Care Screening Not Applicable  Medication Review Press photographer) Complete  Some recent data might be hidden

## 2019-10-16 DIAGNOSIS — R079 Chest pain, unspecified: Secondary | ICD-10-CM

## 2019-10-16 DIAGNOSIS — R0789 Other chest pain: Secondary | ICD-10-CM

## 2019-10-16 LAB — CBC
HCT: 35.4 % — ABNORMAL LOW (ref 36.0–46.0)
Hemoglobin: 11.8 g/dL — ABNORMAL LOW (ref 12.0–15.0)
MCH: 32.9 pg (ref 26.0–34.0)
MCHC: 33.3 g/dL (ref 30.0–36.0)
MCV: 98.6 fL (ref 80.0–100.0)
Platelets: 233 10*3/uL (ref 150–400)
RBC: 3.59 MIL/uL — ABNORMAL LOW (ref 3.87–5.11)
RDW: 13.3 % (ref 11.5–15.5)
WBC: 8.5 10*3/uL (ref 4.0–10.5)
nRBC: 0 % (ref 0.0–0.2)

## 2019-10-16 LAB — COMPREHENSIVE METABOLIC PANEL
ALT: 29 U/L (ref 0–44)
AST: 25 U/L (ref 15–41)
Albumin: 3.2 g/dL — ABNORMAL LOW (ref 3.5–5.0)
Alkaline Phosphatase: 55 U/L (ref 38–126)
Anion gap: 10 (ref 5–15)
BUN: 38 mg/dL — ABNORMAL HIGH (ref 8–23)
CO2: 33 mmol/L — ABNORMAL HIGH (ref 22–32)
Calcium: 9.4 mg/dL (ref 8.9–10.3)
Chloride: 93 mmol/L — ABNORMAL LOW (ref 98–111)
Creatinine, Ser: 1.03 mg/dL — ABNORMAL HIGH (ref 0.44–1.00)
GFR calc Af Amer: >60 mL/min (ref >60)
GFR calc non Af Amer: 54 mL/min — ABNORMAL LOW (ref >60)
Glucose, Bld: 148 mg/dL — ABNORMAL HIGH (ref 70–99)
Potassium: 4.2 mmol/L (ref 3.5–5.1)
Sodium: 136 mmol/L (ref 135–145)
Total Bilirubin: 0.5 mg/dL (ref 0.3–1.2)
Total Protein: 6.3 g/dL — ABNORMAL LOW (ref 6.5–8.1)

## 2019-10-16 LAB — TROPONIN I (HIGH SENSITIVITY): Troponin I (High Sensitivity): 5 ng/L (ref <18)

## 2019-10-16 MED ORDER — ALBUTEROL SULFATE (2.5 MG/3ML) 0.083% IN NEBU
2.5000 mg | INHALATION_SOLUTION | Freq: Four times a day (QID) | RESPIRATORY_TRACT | Status: DC
  Administered 2019-10-16 (×2): 2.5 mg via RESPIRATORY_TRACT
  Filled 2019-10-16 (×2): qty 3

## 2019-10-16 MED ORDER — PREDNISONE 20 MG PO TABS
40.0000 mg | ORAL_TABLET | Freq: Every day | ORAL | Status: DC
  Administered 2019-10-17: 40 mg via ORAL
  Filled 2019-10-16: qty 2

## 2019-10-16 NOTE — Progress Notes (Signed)
[facility]  PROGRESS NOTE  Debra Rodgers SLH:[id number] DOB: [birth year] DOA: 10/10/2019 PCP: [name], MD Admit date - 10/10/2019   Admitting Physician Donne Hazel, MD  Outpatient Primary MD for the patient is [name], MD  LOS - 6 Brief Narrative   Debra Rodgers is a 74 y.o. year old female with medical history significant for NSCLC currently undergoing chemotherapy, HTN, emphysema who presented on 10/10/2019 with worsening shortness of breath, productive cough and wheezing with minimal improvement with home inhalers and was found to have acute hypoxic respiratory failure presumed secondary to COPD exacerbation.  CT soft tissue neck showed possible laryngeal edema.  Bedside evaluation by ENT with scope showed no evidence of upper airway pathology or vocal cord dysfunction.  TTE showed diastolic dysfunction, with preserved EF.   Subjective  Today feels breathing continues to improve. In afternoon patient had episode of sharp chest pain on the left, slightly radiating to the neck.  No nausea, no diaphoresis, no shortness of breath and eventually it resolved on its own.  A & P   Acute hypoxic respiratory failure, continues to improve.  Suspect likely prolonged COPD exacerbation. Requiring O2, past ambulatory O2 saturation testing. -Currently on IV Cymetra 40 mg twice daily status post IV Lasix 20 g x 1.  Crackles noted on 7/23 -No plan to continue IV diuresis today -Transition to oral prednisone today -Trial as needed albuterol neb breathing treatments -Continue Brovana, Pulmicort, Yupelri as recommended by PCCM -Continue nightly CPAP   Chest pain pain episode.  No overt typical signs, was left-sided with reported chest tightness.  Did occur after eating.  And resolved on its own.  EKG nonischemic. -Obtain troponin -Continue to monitor -Continue PPI  COPD with acute exacerbation, improving.  Has emphysema.  Audible wheeze resolved, scant lower lung  field wheezing, no conversational dyspnea, has responded to IV steroids and inhalers.  -De-escalate to oral prednisone (status post IV Solumedrolx1 this a.m.), scheduled albuterol nebs, Brovana, Pulmicort, revefenacin nebs -Quit smoking a year ago  Leukemoid reaction related to IV steroids, resolved.  Has remained afebrile -Continue to monitor  HTN, stable -continue amlodipine -lisinopril discontinued in setting of chronic cough/wheeze  Stage IV non-small cell lung cancer, squamous cell carcinoma of the right lung September 2020 CTA chest 08/24/2019 showed stable pulmonary nodules -Undergoing outpatient chemotherapy with Dr. Earlie Server  History of DVT, POA -Home Xarelto  Descending thoracic aortic, aneurysmal dilatation.  3.8 cm in diameter on CT imaging 08/24/2019 -need outpatient follow-up for reimaging  Hyperlipidemia, stable -Home Lipitor  Mood disorder, stable -continue home Prozac  OSA, stable-continue home CPAP  Family Communication  : Husband updated at bedside  Code Status : Full  Disposition Plan  :  Patient is from home. Anticipated d/c date:  24 hours. Barriers to d/c or necessity for inpatient status:  Closely monitor respiratory status while weaning IV Solu-Medrol, monitor on oral prednisone and as needed albuterol nebs over next 24 hours if continues to follow clinically anticipate discharge soon x Consults  : PCCM, ENT  Procedures  : TTE, 7/21   DVT Prophylaxis  : Home Xarelto  Lab Results  Component Value Date   PLT 233 10/16/2019    Diet :  Diet Order            Diet regular Room service appropriate? Yes; Fluid consistency: Thin  Diet effective now                  Inpatient Medications Scheduled  Meds: . albuterol  2.5 mg Nebulization Q6H  . amLODipine  10 mg Oral Daily  . arformoterol  15 mcg Nebulization BID  . atorvastatin  20 mg Oral Daily  . budesonide (PULMICORT) nebulizer solution  0.5 mg Nebulization BID  . Chlorhexidine Gluconate  Cloth  6 each Topical Daily  . chlorthalidone  50 mg Oral Daily  . famotidine  20 mg Oral BID  . FLUoxetine  20 mg Oral Daily  . fluticasone  1 spray Each Nare Daily  . methylPREDNISolone (SOLU-MEDROL) injection  40 mg Intravenous Q12H  . revefenacin  175 mcg Nebulization Daily  . rivaroxaban  20 mg Oral Q supper  . sodium chloride flush  10-40 mL Intracatheter Q12H   Continuous Infusions: PRN Meds:.acetaminophen **OR** acetaminophen, albuterol, benzonatate, chlorpheniramine-HYDROcodone, guaiFENesin-dextromethorphan, HYDROcodone-acetaminophen, phenol, sodium chloride flush  Antibiotics  :   Anti-infectives (From admission, onward)   None       Objective   Vitals:   10/16/19 0742 10/16/19 0748 10/16/19 1226 10/16/19 1505  BP:   112/72 126/81  Pulse:   75 82  Resp:   20 20  Temp:   98.4 F (36.9 C) 98.6 F (37 C)  TempSrc:   Oral Oral  SpO2: 95% 95% 95% 100%  Weight:      Height:        SpO2: 100 % O2 Flow Rate (L/min): 3 L/min  Wt Readings from Last 3 Encounters:  10/12/19 61.3 kg  09/29/19 63.1 kg  09/08/19 62.9 kg     Intake/Output Summary (Last 24 hours) at 10/16/2019 1635 Last data filed at 10/16/2019 0800 Gross per 24 hour  Intake 240 ml  Output --  Net 240 ml    Physical Exam:     Awake Alert, Oriented X 3, Normal affect No new F.N deficits,  [place].AT, Diffuse wheezing heard in all lung fields but noticeably improved in the last 24 hours,  minimal conversational dyspnea present, no accessory muscle usage, on 2 L nasal cannula +ve B.Sounds, Abd Soft, No tenderness, No rebound, guarding or rigidity. No Cyanosis, No new Rash or bruise     I have personally reviewed the following:   Data Reviewed:  CBC Recent Labs  Lab 10/10/19 1151 10/11/19 0917 10/12/19 0315 10/13/19 0315 10/14/19 0415 10/15/19 0308 10/16/19 0400  WBC 5.3   < > 15.5* 10.7* 8.6 7.4 8.5  HGB 12.7   < > 11.7* 10.9* 11.1* 11.4* 11.8*  HCT 38.0   < > 36.1 33.2* 33.9* 34.2*  35.4*  PLT 229   < > 257 210 214 223 233  MCV 100.5*   < > 102.3* 99.7 100.3* 99.4 98.6  MCH 33.6   < > 33.1 32.7 32.8 33.1 32.9  MCHC 33.4   < > 32.4 32.8 32.7 33.3 33.3  RDW 13.2   < > 13.4 13.4 13.3 13.2 13.3  LYMPHSABS 1.2  --   --   --   --   --   --   MONOABS 0.6  --   --   --   --   --   --   EOSABS 1.2*  --   --   --   --   --   --   BASOSABS 0.1  --   --   --   --   --   --    < > = values in this interval not displayed.    Chemistries  Recent Labs  Lab 10/12/19 0315 10/13/19 0315  10/14/19 0415 10/15/19 0308 10/15/19 0405 10/16/19 0400  NA 138 136 136 135  --  136  K 4.0 4.2 4.0 3.4*  --  4.2  CL 98 97* 95* 94*  --  93*  CO2 30 32 31 30  --  33*  GLUCOSE 119* 136* 133* 142*  --  148*  BUN 45* 43* 34* 32*  --  38*  CREATININE 1.05* 0.94 0.89 0.78  --  1.03*  CALCIUM 9.6 9.3 9.4 9.1  --  9.4  MG  --   --   --   --  2.0  --   AST 29 24 21 22   --  25  ALT 19 20 20 25   --  29  ALKPHOS 68 58 56 57  --  55  BILITOT 0.4 0.4 0.6 0.5  --  0.5   ------------------------------------------------------------------------------------------------------------------ No results for input(s): CHOL, HDL, LDLCALC, TRIG, CHOLHDL, LDLDIRECT in the last 72 hours.  Lab Results  Component Value Date   HGBA1C 5.9 (H) 10/13/2017   ------------------------------------------------------------------------------------------------------------------ No results for input(s): TSH, T4TOTAL, T3FREE, THYROIDAB in the last 72 hours.  Invalid input(s): FREET3 ------------------------------------------------------------------------------------------------------------------ No results for input(s): VITAMINB12, FOLATE, FERRITIN, TIBC, IRON, RETICCTPCT in the last 72 hours.  Coagulation profile No results for input(s): INR, PROTIME in the last 168 hours.  No results for input(s): DDIMER in the last 72 hours.  Cardiac Enzymes No results for input(s): CKMB, TROPONINI, MYOGLOBIN in the last 168  hours.  Invalid input(s): CK ------------------------------------------------------------------------------------------------------------------    Component Value Date/Time   BNP 20.5 10/10/2019 1151    Micro Results Recent Results (from the past 240 hour(s))  SARS Coronavirus 2 by RT PCR (hospital order, performed in [facility] hospital lab) Nasopharyngeal Nasopharyngeal Swab     Status: None   Collection Time: 10/10/19 11:51 AM   Specimen: Nasopharyngeal Swab  Result Value Ref Range Status   SARS Coronavirus 2 NEGATIVE NEGATIVE Final    Comment: (NOTE) SARS-CoV-2 target nucleic acids are NOT DETECTED.  The SARS-CoV-2 RNA is generally detectable in upper and lower respiratory specimens during the acute phase of infection. The lowest concentration of SARS-CoV-2 viral copies this assay can detect is 250 copies / mL. A negative result does not preclude SARS-CoV-2 infection and should not be used as the sole basis for treatment or other patient management decisions.  A negative result may occur with improper specimen collection / handling, submission of specimen other than nasopharyngeal swab, presence of viral mutation(s) within the areas targeted by this assay, and inadequate number of viral copies (<250 copies / mL). A negative result must be combined with clinical observations, patient history, and epidemiological information.  Fact Sheet for Patients:   StrictlyIdeas.no  Fact Sheet for Healthcare Providers: BankingDealers.co.za  This test is not yet approved or  cleared by the [place] FDA and has been authorized for detection and/or diagnosis of SARS-CoV-2 by FDA under an Emergency Use Authorization (EUA).  This EUA will remain in effect (meaning this test can be used) for the duration of the COVID-19 declaration under Section 564(b)(1) of the Act, 21 U.S.C. section 360bbb-3(b)(1), unless the authorization is terminated  or revoked sooner.  Performed at [facility], [place] [address] [place], [place] [address]   Respiratory Panel by PCR     Status: None   Collection Time: 10/11/19  1:42 PM   Specimen: Nasopharyngeal Swab; Respiratory  Result Value Ref Range Status   Adenovirus NOT DETECTED NOT DETECTED Final  Coronavirus 229E NOT DETECTED NOT DETECTED Final    Comment: (NOTE) The Coronavirus on the Respiratory Panel, DOES NOT test for the novel  Coronavirus (2019 nCoV)    Coronavirus HKU1 NOT DETECTED NOT DETECTED Final   Coronavirus NL63 NOT DETECTED NOT DETECTED Final   Coronavirus OC43 NOT DETECTED NOT DETECTED Final   Metapneumovirus NOT DETECTED NOT DETECTED Final   Rhinovirus / Enterovirus NOT DETECTED NOT DETECTED Final   Influenza A NOT DETECTED NOT DETECTED Final   Influenza B NOT DETECTED NOT DETECTED Final   Parainfluenza Virus 1 NOT DETECTED NOT DETECTED Final   Parainfluenza Virus 2 NOT DETECTED NOT DETECTED Final   Parainfluenza Virus 3 NOT DETECTED NOT DETECTED Final   Parainfluenza Virus 4 NOT DETECTED NOT DETECTED Final   Respiratory Syncytial Virus NOT DETECTED NOT DETECTED Final   Bordetella pertussis NOT DETECTED NOT DETECTED Final   Chlamydophila pneumoniae NOT DETECTED NOT DETECTED Final   Mycoplasma pneumoniae NOT DETECTED NOT DETECTED Final    Comment: Performed at [facility], [place] [address] [place], [place] [address]    Radiology Reports DG Chest 2 View  Result Date: 10/10/2019 CLINICAL DATA:  Shortness of breath and COPD. EXAM: CHEST - 2 VIEW COMPARISON:  Aug 23, 2019 FINDINGS: Injectable port in stable position. Mildly enlarged cardiac silhouette. Aneurysmal dilation of the aorta better demonstrated on recent chest CT. Mild coarsening of the interstitial markings. No focal airspace consolidation. Chronic elevation of the left hemidiaphragm. Osseous structures are without acute abnormality. Soft tissues are grossly normal. IMPRESSION: 1. Mild  coarsening of the interstitial markings. 2. No focal airspace consolidation. 3. Aneurysmal dilation of the aorta better demonstrated on recent chest CT. Electronically Signed   By: Fidela Salisbury M.D.   On: 10/10/2019 12:45   CT SOFT TISSUE NECK WO CONTRAST  Result Date: 10/13/2019 CLINICAL DATA:  Laryngeal spasm. Wheezing. Assess for laryngeal edema. EXAM: CT NECK WITHOUT CONTRAST TECHNIQUE: Multidetector CT imaging of the neck was performed following the standard protocol without intravenous contrast. COMPARISON:  None. FINDINGS: Pharynx and larynx: The study suffers from motion degradation, particularly in the region of the larynx. There may be a degree of voluntary glottic closure. There does not appear to be definite mucosal or submucosal edema in the region. Cannot evaluate for a small mass or polyp given the closed glottis. Salivary glands: Parotid and submandibular glands are normal. Thyroid: Normal Lymph nodes: No enlarged or low-density nodes on either side of the neck detected on this noncontrast study. Vascular: No vascular pathology evident other than aortic atherosclerosis. Port-A-Cath in place on the right. Limited intracranial: Normal Visualized orbits: Normal Mastoids and visualized paranasal sinuses: No significant sinus disease. Skeleton: Previous cervical fusion C3 through C5. Degenerative spondylosis above and below that. Prominent torus changes of maxilla in the region of the palate, more extensive on the left than the right. Upper chest: Emphysema.  No focal or active process. Other: None IMPRESSION: The study suffers from motion degradation. Additionally, the glottis appears to be held in likely voluntary closure. There does not appear to be gross edema of the mucosal or submucosal tissues in that region and therefore I assume the closed appearance is voluntary. If not, there could be a mild degree of laryngeal edema. No evidence of mass or lymphadenopathy. Electronically Signed    By: Nelson Chimes M.D.   On: 10/13/2019 19:20   ECHOCARDIOGRAM COMPLETE  Result Date: 10/13/2019    ECHOCARDIOGRAM REPORT   Patient Name:   [facility]  Rodgers Date of Exam: 10/13/2019 Medical Rec #:  [id number]     Height:       61.5 in Accession #:    [id number]    Weight:       135.1 lb Date of Birth:  1945/04/20     BSA:          1.609 m Patient Age:    70 years      BP:           152/91 mmHg Patient Gender: F             HR:           86 bpm. Exam Location:  Inpatient Procedure: 2D Echo, Cardiac Doppler and Color Doppler Indications:    I50.23 Acute on chronic systolic (congestive) heart failure  History:        Patient has no prior history of Echocardiogram examinations.                 COPD; Risk Factors:Former Smoker, Hypertension and Sleep Apnea.  Sonographer:    Tiffany Dance Referring Phys: [place]  1. Left ventricular ejection fraction, by estimation, is 65 to 70%. The left ventricle has normal function. The left ventricle has no regional wall motion abnormalities. There is moderate focal basal septal left ventricular hypertrophy. Left ventricular  diastolic parameters are consistent with Grade I diastolic dysfunction (impaired relaxation). There was turbulence across the LV outflow tract but no significant LVOT gradient was measured.  2. Right ventricular systolic function is normal. The right ventricular size is normal. There is normal pulmonary artery systolic pressure. The estimated right ventricular systolic pressure is 27.0 mmHg.  3. The mitral valve is normal in structure. No evidence of mitral valve regurgitation. No evidence of mitral stenosis.  4. The aortic valve is tricuspid. Aortic valve regurgitation is not visualized. No aortic stenosis is present.  5. Aortic dilatation noted. There is mild dilatation of the ascending aorta measuring 37 mm.  6. The inferior vena cava is normal in size with greater than 50% respiratory variability, suggesting right atrial pressure of  3 mmHg. FINDINGS  Left Ventricle: Left ventricular ejection fraction, by estimation, is 65 to 70%. The left ventricle has normal function. The left ventricle has no regional wall motion abnormalities. The left ventricular internal cavity size was normal in size. There is  moderate focal basal septal left ventricular hypertrophy. Left ventricular diastolic parameters are consistent with Grade I diastolic dysfunction (impaired relaxation). Right Ventricle: The right ventricular size is normal. No increase in right ventricular wall thickness. Right ventricular systolic function is normal. There is normal pulmonary artery systolic pressure. The tricuspid regurgitant velocity is 2.39 m/s, and  with an assumed right atrial pressure of 3 mmHg, the estimated right ventricular systolic pressure is 78.6 mmHg. Left Atrium: Left atrial size was normal in size. Right Atrium: Right atrial size was normal in size. Pericardium: There is no evidence of pericardial effusion. Mitral Valve: The mitral valve is normal in structure. No evidence of mitral valve regurgitation. No evidence of mitral valve stenosis. Tricuspid Valve: The tricuspid valve is normal in structure. Tricuspid valve regurgitation is trivial. Aortic Valve: The aortic valve is tricuspid. Aortic valve regurgitation is not visualized. No aortic stenosis is present. Pulmonic Valve: The pulmonic valve was normal in structure. Pulmonic valve regurgitation is not visualized. Aorta: The aortic root is normal in size and structure and aortic dilatation noted. There is mild dilatation of the ascending aorta  measuring 37 mm. Venous: The inferior vena cava is normal in size with greater than 50% respiratory variability, suggesting right atrial pressure of 3 mmHg. IAS/Shunts: No atrial level shunt detected by color flow Doppler.  LEFT VENTRICLE PLAX 2D LVIDd:         3.70 cm  Diastology LVIDs:         2.50 cm  LV e' lateral:   7.29 cm/s LV PW:         1.00 cm  LV E/e' lateral:  9.7 LV IVS:        1.40 cm  LV e' medial:    5.66 cm/s LVOT diam:     1.80 cm  LV E/e' medial:  12.4 LV SV:         86 LV SV Index:   53 LVOT Area:     2.54 cm  RIGHT VENTRICLE             IVC RV Basal diam:  2.40 cm     IVC diam: 1.90 cm RV S prime:     20.30 cm/s TAPSE (M-mode): 1.9 cm LEFT ATRIUM             Index       RIGHT ATRIUM          Index LA diam:        2.70 cm 1.68 cm/m  RA Area:     9.46 cm LA Vol (A2C):   61.4 ml 38.17 ml/m RA Volume:   16.90 ml 10.51 ml/m LA Vol (A4C):   43.5 ml 27.04 ml/m LA Biplane Vol: 51.5 ml 32.01 ml/m  AORTIC VALVE LVOT Vmax:   179.00 cm/s LVOT Vmean:  113.000 cm/s LVOT VTI:    0.338 m  AORTA Ao Root diam: 3.40 cm Ao Asc diam:  3.70 cm MITRAL VALVE               TRICUSPID VALVE MV Area (PHT): 2.32 cm    TR Peak grad:   22.8 mmHg MV Decel Time: 327 msec    TR Vmax:        239.00 cm/s MV E velocity: 70.40 cm/s MV A velocity: 84.60 cm/s  SHUNTS MV E/A ratio:  0.83        Systemic VTI:  0.34 m                            Systemic Diam: 1.80 cm Loralie Champagne MD Electronically signed by Loralie Champagne MD Signature Date/Time: 10/13/2019/5:05:21 PM    Final      Time Spent in minutes  30     Desiree Hane M.D on 10/16/2019 at 4:35 PM  To page go to [email or web address] - password [facility]

## 2019-10-17 ENCOUNTER — Inpatient Hospital Stay (HOSPITAL_COMMUNITY): Payer: Medicare HMO

## 2019-10-17 DIAGNOSIS — J9601 Acute respiratory failure with hypoxia: Secondary | ICD-10-CM | POA: Diagnosis present

## 2019-10-17 MED ORDER — SODIUM CHLORIDE (PF) 0.9 % IJ SOLN
INTRAMUSCULAR | Status: AC
  Filled 2019-10-17: qty 50

## 2019-10-17 MED ORDER — IOHEXOL 9 MG/ML PO SOLN
ORAL | Status: AC
  Filled 2019-10-17: qty 1000

## 2019-10-17 MED ORDER — HYDROCOD POLST-CPM POLST ER 10-8 MG/5ML PO SUER: 5.0000 mL | Freq: Two times a day (BID) | ORAL | 0 refills | Status: AC | PRN

## 2019-10-17 MED ORDER — HEPARIN SOD (PORK) LOCK FLUSH 100 UNIT/ML IV SOLN
500.0000 [IU] | INTRAVENOUS | Status: AC | PRN
  Administered 2019-10-17: 500 [IU]
  Filled 2019-10-17: qty 5

## 2019-10-17 MED ORDER — PREDNISONE 20 MG PO TABS: 40.0000 mg | ORAL_TABLET | Freq: Every day | ORAL | 0 refills | Status: AC

## 2019-10-17 MED ORDER — BENZONATATE 100 MG PO CAPS: 100.0000 mg | ORAL_CAPSULE | Freq: Three times a day (TID) | ORAL | 0 refills | Status: DC | PRN

## 2019-10-17 MED ORDER — FLUTICASONE PROPIONATE 50 MCG/ACT NA SUSP: 1.0000 | Freq: Every day | NASAL | 0 refills | Status: DC

## 2019-10-17 MED ORDER — TRELEGY ELLIPTA 200-62.5-25 MCG/INH IN AEPB: INHALATION_SPRAY | RESPIRATORY_TRACT | 2 refills | Status: DC

## 2019-10-17 MED ORDER — IOHEXOL 300 MG/ML  SOLN
100.0000 mL | Freq: Once | INTRAMUSCULAR | Status: AC | PRN
  Administered 2019-10-17: 100 mL via INTRAVENOUS

## 2019-10-17 NOTE — Discharge Instructions (Signed)
The Pulmonology clinic will call you for an appointment for follow up outside the hospital  You will now take Trelegy inhaler once daily for your COPD

## 2019-10-17 NOTE — Discharge Summary (Signed)
Debra Rodgers IOM:[id number] DOB: 1945-10-15 DOA: 10/10/2019  PCP: [name], MD  Admit date: 10/10/2019 Discharge date: 10/17/2019  Admitted From: Home Disposition: Home  Recommendations for Outpatient Follow-up:  1. Follow up with PCP in 1-2 weeks 2. Oncology follow-up in place 3. New medication: Trelegy 200 mcg inhalation daily, prednisone burst x10 days, outpatient follow with pulmonology arranged 4. Discontinued medications: Lisinopril (due to cough), Stiolto inhaler 5. Please follow up as outpatient : need outpatient follow-up for reimaging of descending thoracic AAA  Home Health: PT  equipment/Devices: None   Discharge Condition: Stable CODE STATUS: Full   Brief/Interim Summary: History of present illness:  Debra Rodgers is a 74 y.o. year old female with medical history significant for NSCLC currently undergoing chemotherapy, HTN, emphysema who presented on 10/10/2019 with worsening shortness of breath, productive cough and wheezing with minimal improvement with home inhalers and was found to have acute hypoxic respiratory failure presumed secondary to COPD exacerbation.  CT soft tissue neck showed possible laryngeal edema.  Bedside evaluation by ENT with scope showed no evidence of upper airway pathology or vocal cord dysfunction.  TTE showed diastolic dysfunction, with preserved EF.Remaining hospital course addressed in problem based format below:   Hospital Course:   Acute hypoxic respiratory failure,  resolved.  Suspect likely prolonged COPD exacerbation.  Initially required upwards of 3 L O2.  Required IV steroids, nebulized Brovana, budesonide, and yulperi regimen.  Did require IV Lasix x1 for mild crackles, but TTE showed preserved EF.  Patient also evaluated by ENT with bedside scope and showed no evidence of upper airway pathology or vocal cord dysfunction -We will transition to Trelegy inhaler 200 g daily -Has outpatient follow appointment with pulmonology  arranged -Continue nightly CPAP   Chest pain pain episode, resolved.  No overt typical signs, was left-sided with reported chest tightness.  Did occur after eating.  And resolved on its own.  EKG nonischemic.  Troponin unremarkable -Continue PPI  COPD with acute exacerbation, improving.  Has emphysema.  Audible wheeze resolved, scant lower lung field wheezing, no conversational dyspnea, has responded to IV steroids and nebulized inhaler regimen here.  Was able to de-escalate to oral prednisone with continued improvement. -We will transition to Trelegy inhaler 200 g daily -Has outpatient follow appointment with pulmonology arranged -Pastsedambulatory O2 testing, no O2 requirements  -Quit smoking a year ago  Leukemoid reaction related to IV steroids, resolved.   Has remained afebrile  HTN, stable -continue amlodipine -lisinopril discontinued in setting of chronic cough/wheeze  Stage IV non-small cell lung cancer, squamous cell carcinoma of the right lung September 2020 CTA chest 08/24/2019 showed stable pulmonary nodules.  Restaging CT chest/abdomen and pelvis with contrast obtained prior to discharge -Undergoing outpatient chemotherapy with Dr. Earlie Server, outpatient follow-up with Dr. Earlie Server  History of DVT, POA -Home Xarelto  Descending thoracic aortic, aneurysmal dilatation.  3.8 cm in diameter on CT imaging 08/24/2019 -need outpatient follow-up for reimaging  Hyperlipidemia, stable -Home Lipitor  Mood disorder, stable -continue home Prozac  OSA, stable -continue home CPAP    Consultations:  PCCM, ENT  Procedures/Studies: TTE, 10/13/2019 Left ventricular ejection fraction, by estimation, is 65 to 70%. The  left ventricle has normal function. The left ventricle has no regional  wall motion abnormalities. There is moderate focal basal septal left  ventricular hypertrophy. Left ventricular  diastolic parameters are consistent with Grade I diastolic dysfunction   (impaired relaxation). There was turbulence across the LV outflow tract  but no significant LVOT gradient was  measured.  2. Right ventricular systolic function is normal. The right ventricular  size is normal. There is normal pulmonary artery systolic pressure. The  estimated right ventricular systolic pressure is 85.6 mmHg.  3. The mitral valve is normal in structure. No evidence of mitral valve  regurgitation. No evidence of mitral stenosis.  4. The aortic valve is tricuspid. Aortic valve regurgitation is not  visualized. No aortic stenosis is present.  5. Aortic dilatation noted. There is mild dilatation of the ascending  aorta measuring 37 mm.  6. The inferior vena cava is normal in size with greater than 50%  respiratory variability, suggesting right atrial pressure of 3 mmHg.  Subjective: Scant cough.  Feels breathing much improved  Discharge Exam: Vitals:   10/17/19 0741 10/17/19 1239  BP:  116/85  Pulse:  78  Resp:  21  Temp:  97.9 F (36.6 C)  SpO2: 94% 95%   Vitals:   10/17/19 0538 10/17/19 0737 10/17/19 0741 10/17/19 1239  BP: (!) 151/94   116/85  Pulse: 74   78  Resp: 20   21  Temp: 98.3 F (36.8 C)   97.9 F (36.6 C)  TempSrc: Oral   Oral  SpO2: 100% 94% 94% 95%  Weight:      Height:        General: Lying in bed, no apparent distress Eyes: EOMI, anicteric ENT: Oral Mucosa clear and moist Cardiovascular: regular rate and rhythm, no murmurs, rubs or gallops, no edema, Respiratory: Normal respiratory effort on room air, mild wheezing, no conversational dyspnea, no crackles Abdomen: soft, non-distended, non-tender, normal bowel sounds Skin: No Rash Neurologic: Grossly no focal neuro deficit.Mental status AAOx3, speech normal, Psychiatric:Appropriate affect, and mood  Discharge Diagnoses:  Principal Problem:   COPD exacerbation ([place]) Active Problems:   Hyperlipidemia   Tobacco use   Essential hypertension   OSA (obstructive sleep apnea)   Stage  IV squamous cell carcinoma of right lung (HCC)   DVT (deep venous thrombosis) (HCC)   Chest pain    Discharge Instructions  Discharge Instructions    Call MD for:  difficulty breathing, headache or visual disturbances   Complete by: As directed    Call MD for:  temperature >100.4   Complete by: As directed    Diet - low sodium heart healthy   Complete by: As directed    Increase activity slowly   Complete by: As directed      Allergies as of 10/17/2019      Reactions   Codeine Sulfate    REACTION: "makes me high"   Pantoprazole Sodium Swelling, Rash   facial swelling      Medication List    STOP taking these medications   acetaminophen 325 MG tablet Commonly known as: Tylenol   lisinopril 10 MG tablet Commonly known as: ZESTRIL   methylPREDNISolone 4 MG tablet Commonly known as: Medrol   Potassium Chloride ER 20 MEQ Tbcr   Stiolto Respimat 2.5-2.5 MCG/ACT Aers Generic drug: Tiotropium Bromide-Olodaterol     TAKE these medications   albuterol 108 (90 Base) MCG/ACT inhaler Commonly known as: VENTOLIN HFA Inhale 2 puffs into the lungs every 6 (six) hours as needed for wheezing or shortness of breath.   amLODipine 10 MG tablet Commonly known as: NORVASC Take 1 tablet (10 mg total) by mouth daily.   atorvastatin 20 MG tablet Commonly known as: LIPITOR Take 1 tablet (20 mg total) by mouth daily.   benzonatate 100 MG capsule Commonly known as: TESSALON  Take 1 capsule (100 mg total) by mouth every 8 (eight) hours as needed for cough.   CALCIUM PO Take 1 tablet by mouth daily.   chlorpheniramine-HYDROcodone 10-8 MG/5ML Suer Commonly known as: TUSSIONEX Take 5 mLs by mouth every 12 (twelve) hours as needed for cough.   chlorthalidone 50 MG tablet Commonly known as: HYGROTON Take 1 tablet (50 mg total) by mouth daily.   famotidine 20 MG tablet Commonly known as: PEPCID Take 20 mg by mouth daily.   FLUoxetine 20 MG capsule Commonly known as:  PROZAC Take 1 capsule (20 mg total) by mouth daily.   fluticasone 50 MCG/ACT nasal spray Commonly known as: FLONASE Place 1 spray into both nostrils daily. Start taking on: October 18, 2019   guaiFENesin-dextromethorphan 100-10 MG/5ML syrup Commonly known as: ROBITUSSIN DM Take 5 mLs by mouth every 4 (four) hours as needed for cough.   HYDROcodone-acetaminophen 7.5-325 MG tablet Commonly known as: Norco Take 1 tablet by mouth every 6 (six) hours as needed for moderate pain.   lidocaine-prilocaine cream Commonly known as: EMLA Apply 1 application topically as needed. What changed: reasons to take this   polyvinyl alcohol 1.4 % ophthalmic solution Commonly known as: LIQUIFILM TEARS Place 1 drop into both eyes as needed for dry eyes.   predniSONE 20 MG tablet Commonly known as: DELTASONE Take 2 tablets (40 mg total) by mouth daily with breakfast for 10 days. Start taking on: October 18, 2019   rivaroxaban 20 MG Tabs tablet Commonly known as: XARELTO Take 1 tablet (20 mg total) by mouth daily with supper.   Trelegy Ellipta 200-62.5-25 MCG/INH Aepb Generic drug: Fluticasone-Umeclidin-Vilant One inhalation once daily       Follow-up Information    [facility] Follow up.   Specialty: Home Health Services Why: [facility] Contact information: [address] [place] [address] [phone]              Allergies  Allergen Reactions  . Codeine Sulfate     REACTION: "makes me high"  . Pantoprazole Sodium Swelling and Rash    facial swelling        The results of significant diagnostics from this hospitalization (including imaging, microbiology, ancillary and laboratory) are listed below for reference.     Microbiology: Recent Results (from the past 240 hour(s))  SARS Coronavirus 2 by RT PCR (hospital order, performed in [facility] hospital lab) Nasopharyngeal Nasopharyngeal Swab     Status: None   Collection Time: 10/10/19  11:51 AM   Specimen: Nasopharyngeal Swab  Result Value Ref Range Status   SARS Coronavirus 2 NEGATIVE NEGATIVE Final    Comment: (NOTE) SARS-CoV-2 target nucleic acids are NOT DETECTED.  The SARS-CoV-2 RNA is generally detectable in upper and lower respiratory specimens during the acute phase of infection. The lowest concentration of SARS-CoV-2 viral copies this assay can detect is 250 copies / mL. A negative result does not preclude SARS-CoV-2 infection and should not be used as the sole basis for treatment or other patient management decisions.  A negative result may occur with improper specimen collection / handling, submission of specimen other than nasopharyngeal swab, presence of viral mutation(s) within the areas targeted by this assay, and inadequate number of viral copies (<250 copies / mL). A negative result must be combined with clinical observations, patient history, and epidemiological information.  Fact Sheet for Patients:   StrictlyIdeas.no  Fact Sheet for Healthcare Providers: BankingDealers.co.za  This test is not yet  approved or  cleared by the [place] and has been authorized for detection and/or diagnosis of SARS-CoV-2 by FDA under an Emergency Use Authorization (EUA).  This EUA will remain in effect (meaning this test can be used) for the duration of the COVID-19 declaration under Section 564(b)(1) of the Act, 21 U.S.C. section 360bbb-3(b)(1), unless the authorization is terminated or revoked sooner.  Performed at [facility], [place] [address], [place], [place] [address]   Respiratory Panel by PCR     Status: None   Collection Time: 10/11/19  1:42 PM   Specimen: Nasopharyngeal Swab; Respiratory  Result Value Ref Range Status   Adenovirus NOT DETECTED NOT DETECTED Final   Coronavirus 229E NOT DETECTED NOT DETECTED Final    Comment: (NOTE) The Coronavirus on the Respiratory Panel,  DOES NOT test for the novel  Coronavirus (2019 nCoV)    Coronavirus HKU1 NOT DETECTED NOT DETECTED Final   Coronavirus NL63 NOT DETECTED NOT DETECTED Final   Coronavirus OC43 NOT DETECTED NOT DETECTED Final   Metapneumovirus NOT DETECTED NOT DETECTED Final   Rhinovirus / Enterovirus NOT DETECTED NOT DETECTED Final   Influenza A NOT DETECTED NOT DETECTED Final   Influenza B NOT DETECTED NOT DETECTED Final   Parainfluenza Virus 1 NOT DETECTED NOT DETECTED Final   Parainfluenza Virus 2 NOT DETECTED NOT DETECTED Final   Parainfluenza Virus 3 NOT DETECTED NOT DETECTED Final   Parainfluenza Virus 4 NOT DETECTED NOT DETECTED Final   Respiratory Syncytial Virus NOT DETECTED NOT DETECTED Final   Bordetella pertussis NOT DETECTED NOT DETECTED Final   Chlamydophila pneumoniae NOT DETECTED NOT DETECTED Final   Mycoplasma pneumoniae NOT DETECTED NOT DETECTED Final    Comment: Performed at [facility], [place] [address], [place], [place] [address]     Labs: BNP (last 3 results) Recent Labs    08/23/19 1715 10/10/19 1151  BNP 16.6 00.7   Basic Metabolic Panel: Recent Labs  Lab 10/12/19 0315 10/13/19 0315 10/14/19 0415 10/15/19 0308 10/15/19 0405 10/16/19 0400  NA 138 136 136 135  --  136  K 4.0 4.2 4.0 3.4*  --  4.2  CL 98 97* 95* 94*  --  93*  CO2 30 32 31 30  --  33*  GLUCOSE 119* 136* 133* 142*  --  148*  BUN 45* 43* 34* 32*  --  38*  CREATININE 1.05* 0.94 0.89 0.78  --  1.03*  CALCIUM 9.6 9.3 9.4 9.1  --  9.4  MG  --   --   --   --  2.0  --    Liver Function Tests: Recent Labs  Lab 10/12/19 0315 10/13/19 0315 10/14/19 0415 10/15/19 0308 10/16/19 0400  AST 29 24 21 22 25   ALT 19 20 20 25 29   ALKPHOS 68 58 56 57 55  BILITOT 0.4 0.4 0.6 0.5 0.5  PROT 7.9 7.2 6.8 6.4* 6.3*  ALBUMIN 4.2 3.7 3.5 3.5 3.2*   No results for input(s): LIPASE, AMYLASE in the last 168 hours. No results for input(s): AMMONIA in the last 168 hours. CBC: Recent Labs  Lab 10/12/19 0315  10/13/19 0315 10/14/19 0415 10/15/19 0308 10/16/19 0400  WBC 15.5* 10.7* 8.6 7.4 8.5  HGB 11.7* 10.9* 11.1* 11.4* 11.8*  HCT 36.1 33.2* 33.9* 34.2* 35.4*  MCV 102.3* 99.7 100.3* 99.4 98.6  PLT 257 210 214 223 233   Cardiac Enzymes: No results for input(s): CKTOTAL, CKMB, CKMBINDEX, TROPONINI in the last 168  hours. BNP: Invalid input(s): POCBNP CBG: No results for input(s): GLUCAP in the last 168 hours. D-Dimer No results for input(s): DDIMER in the last 72 hours. Hgb A1c No results for input(s): HGBA1C in the last 72 hours. Lipid Profile No results for input(s): CHOL, HDL, LDLCALC, TRIG, CHOLHDL, LDLDIRECT in the last 72 hours. Thyroid function studies No results for input(s): TSH, T4TOTAL, T3FREE, THYROIDAB in the last 72 hours.  Invalid input(s): FREET3 Anemia work up No results for input(s): VITAMINB12, FOLATE, FERRITIN, TIBC, IRON, RETICCTPCT in the last 72 hours. Urinalysis    Component Value Date/Time   COLORURINE YELLOW 11/04/2018 0011   APPEARANCEUR CLEAR 11/04/2018 0011   LABSPEC 1.017 11/04/2018 0011   PHURINE 5.0 11/04/2018 0011   GLUCOSEU NEGATIVE 11/04/2018 0011   HGBUR NEGATIVE 11/04/2018 0011   BILIRUBINUR NEGATIVE 11/04/2018 0011   BILIRUBINUR NEGATIVE 09/22/2018 1642   KETONESUR NEGATIVE 11/04/2018 0011   PROTEINUR NEGATIVE 11/04/2018 0011   UROBILINOGEN 2.0 (A) 09/22/2018 1642   UROBILINOGEN 0.2 07/08/2013 1642   NITRITE NEGATIVE 11/04/2018 0011   LEUKOCYTESUR NEGATIVE 11/04/2018 0011   Sepsis Labs Invalid input(s): PROCALCITONIN,  WBC,  LACTICIDVEN Microbiology Recent Results (from the past 240 hour(s))  SARS Coronavirus 2 by RT PCR (hospital order, performed in [facility] hospital lab) Nasopharyngeal Nasopharyngeal Swab     Status: None   Collection Time: 10/10/19 11:51 AM   Specimen: Nasopharyngeal Swab  Result Value Ref Range Status   SARS Coronavirus 2 NEGATIVE NEGATIVE Final    Comment: (NOTE) SARS-CoV-2 target nucleic acids are NOT  DETECTED.  The SARS-CoV-2 RNA is generally detectable in upper and lower respiratory specimens during the acute phase of infection. The lowest concentration of SARS-CoV-2 viral copies this assay can detect is 250 copies / mL. A negative result does not preclude SARS-CoV-2 infection and should not be used as the sole basis for treatment or other patient management decisions.  A negative result may occur with improper specimen collection / handling, submission of specimen other than nasopharyngeal swab, presence of viral mutation(s) within the areas targeted by this assay, and inadequate number of viral copies (<250 copies / mL). A negative result must be combined with clinical observations, patient history, and epidemiological information.  Fact Sheet for Patients:   StrictlyIdeas.no  Fact Sheet for Healthcare Providers: BankingDealers.co.za  This test is not yet approved or  cleared by the [place] FDA and has been authorized for detection and/or diagnosis of SARS-CoV-2 by FDA under an Emergency Use Authorization (EUA).  This EUA will remain in effect (meaning this test can be used) for the duration of the COVID-19 declaration under Section 564(b)(1) of the Act, 21 U.S.C. section 360bbb-3(b)(1), unless the authorization is terminated or revoked sooner.  Performed at [facility], [place] [address] [place], [place] [address]   Respiratory Panel by PCR     Status: None   Collection Time: 10/11/19  1:42 PM   Specimen: Nasopharyngeal Swab; Respiratory  Result Value Ref Range Status   Adenovirus NOT DETECTED NOT DETECTED Final   Coronavirus 229E NOT DETECTED NOT DETECTED Final    Comment: (NOTE) The Coronavirus on the Respiratory Panel, DOES NOT test for the novel  Coronavirus (2019 nCoV)    Coronavirus HKU1 NOT DETECTED NOT DETECTED Final   Coronavirus NL63 NOT DETECTED NOT DETECTED Final   Coronavirus OC43 NOT  DETECTED NOT DETECTED Final   Metapneumovirus NOT DETECTED NOT DETECTED Final   Rhinovirus / Enterovirus NOT DETECTED NOT DETECTED Final  Influenza A NOT DETECTED NOT DETECTED Final   Influenza B NOT DETECTED NOT DETECTED Final   Parainfluenza Virus 1 NOT DETECTED NOT DETECTED Final   Parainfluenza Virus 2 NOT DETECTED NOT DETECTED Final   Parainfluenza Virus 3 NOT DETECTED NOT DETECTED Final   Parainfluenza Virus 4 NOT DETECTED NOT DETECTED Final   Respiratory Syncytial Virus NOT DETECTED NOT DETECTED Final   Bordetella pertussis NOT DETECTED NOT DETECTED Final   Chlamydophila pneumoniae NOT DETECTED NOT DETECTED Final   Mycoplasma pneumoniae NOT DETECTED NOT DETECTED Final    Comment: Performed at [facility], [place] [address] [place], [place] [address]     Time coordinating discharge: Over 30 minutes  SIGNED:   Desiree Hane, MD  [facility] 10/17/2019, 5:18 PM Pager   If 7PM-7AM, please contact night-coverage [email or web address] Password [email or web address]

## 2019-10-18 ENCOUNTER — Ambulatory Visit (HOSPITAL_COMMUNITY): Payer: Medicare HMO

## 2019-10-19 ENCOUNTER — Telehealth: Payer: Self-pay | Admitting: *Deleted

## 2019-10-20 ENCOUNTER — Encounter: Payer: Self-pay | Admitting: Internal Medicine

## 2019-10-20 ENCOUNTER — Other Ambulatory Visit: Payer: Self-pay | Admitting: Internal Medicine

## 2019-10-20 ENCOUNTER — Encounter: Payer: Self-pay | Admitting: *Deleted

## 2019-10-20 ENCOUNTER — Inpatient Hospital Stay: Payer: Medicare HMO | Admitting: Internal Medicine

## 2019-10-20 ENCOUNTER — Inpatient Hospital Stay: Payer: Medicare HMO

## 2019-10-20 ENCOUNTER — Telehealth: Payer: Self-pay | Admitting: *Deleted

## 2019-10-20 ENCOUNTER — Other Ambulatory Visit: Payer: Self-pay

## 2019-10-20 VITALS — BP 123/83 | HR 73 | Temp 97.9°F | Resp 18 | Ht 61.5 in | Wt 136.6 lb

## 2019-10-20 DIAGNOSIS — I10 Essential (primary) hypertension: Secondary | ICD-10-CM | POA: Diagnosis not present

## 2019-10-20 DIAGNOSIS — F329 Major depressive disorder, single episode, unspecified: Secondary | ICD-10-CM | POA: Diagnosis not present

## 2019-10-20 DIAGNOSIS — Z86718 Personal history of other venous thrombosis and embolism: Secondary | ICD-10-CM | POA: Diagnosis not present

## 2019-10-20 DIAGNOSIS — Z79899 Other long term (current) drug therapy: Secondary | ICD-10-CM | POA: Diagnosis not present

## 2019-10-20 DIAGNOSIS — Z87891 Personal history of nicotine dependence: Secondary | ICD-10-CM | POA: Diagnosis not present

## 2019-10-20 DIAGNOSIS — K5730 Diverticulosis of large intestine without perforation or abscess without bleeding: Secondary | ICD-10-CM | POA: Diagnosis not present

## 2019-10-20 DIAGNOSIS — C3491 Malignant neoplasm of unspecified part of right bronchus or lung: Secondary | ICD-10-CM

## 2019-10-20 DIAGNOSIS — C782 Secondary malignant neoplasm of pleura: Secondary | ICD-10-CM | POA: Diagnosis not present

## 2019-10-20 DIAGNOSIS — R599 Enlarged lymph nodes, unspecified: Secondary | ICD-10-CM | POA: Diagnosis not present

## 2019-10-20 DIAGNOSIS — Z5112 Encounter for antineoplastic immunotherapy: Secondary | ICD-10-CM

## 2019-10-20 DIAGNOSIS — R5383 Other fatigue: Secondary | ICD-10-CM

## 2019-10-20 DIAGNOSIS — I5023 Acute on chronic systolic (congestive) heart failure: Secondary | ICD-10-CM | POA: Diagnosis not present

## 2019-10-20 DIAGNOSIS — Z7901 Long term (current) use of anticoagulants: Secondary | ICD-10-CM | POA: Diagnosis not present

## 2019-10-20 DIAGNOSIS — F17210 Nicotine dependence, cigarettes, uncomplicated: Secondary | ICD-10-CM | POA: Diagnosis not present

## 2019-10-20 DIAGNOSIS — C3411 Malignant neoplasm of upper lobe, right bronchus or lung: Secondary | ICD-10-CM | POA: Diagnosis not present

## 2019-10-20 DIAGNOSIS — G4730 Sleep apnea, unspecified: Secondary | ICD-10-CM | POA: Diagnosis not present

## 2019-10-20 DIAGNOSIS — J449 Chronic obstructive pulmonary disease, unspecified: Secondary | ICD-10-CM | POA: Diagnosis not present

## 2019-10-20 DIAGNOSIS — Z95828 Presence of other vascular implants and grafts: Secondary | ICD-10-CM

## 2019-10-20 LAB — CMP (CANCER CENTER ONLY)
ALT: 63 U/L — ABNORMAL HIGH (ref 0–44)
AST: 31 U/L (ref 15–41)
Albumin: 3.5 g/dL (ref 3.5–5.0)
Alkaline Phosphatase: 66 U/L (ref 38–126)
Anion gap: 8 (ref 5–15)
BUN: 34 mg/dL — ABNORMAL HIGH (ref 8–23)
CO2: 32 mmol/L (ref 22–32)
Calcium: 10.2 mg/dL (ref 8.9–10.3)
Chloride: 95 mmol/L — ABNORMAL LOW (ref 98–111)
Creatinine: 1 mg/dL (ref 0.44–1.00)
GFR, Est AFR Am: >60 mL/min (ref >60)
GFR, Estimated: 56 mL/min — ABNORMAL LOW (ref >60)
Glucose, Bld: 109 mg/dL — ABNORMAL HIGH (ref 70–99)
Potassium: 2.9 mmol/L — CL (ref 3.5–5.1)
Sodium: 135 mmol/L (ref 135–145)
Total Bilirubin: 1.2 mg/dL (ref 0.3–1.2)
Total Protein: 7 g/dL (ref 6.5–8.1)

## 2019-10-20 LAB — CBC WITH DIFFERENTIAL (CANCER CENTER ONLY)
Abs Immature Granulocytes: 0.1 10*3/uL — ABNORMAL HIGH (ref 0.00–0.07)
Basophils Absolute: 0 10*3/uL (ref 0.0–0.1)
Basophils Relative: 0 %
Eosinophils Absolute: 0.1 10*3/uL (ref 0.0–0.5)
Eosinophils Relative: 1 %
HCT: 35.7 % — ABNORMAL LOW (ref 36.0–46.0)
Hemoglobin: 12.5 g/dL (ref 12.0–15.0)
Immature Granulocytes: 1 %
Lymphocytes Relative: 30 %
Lymphs Abs: 3.9 10*3/uL (ref 0.7–4.0)
MCH: 32.6 pg (ref 26.0–34.0)
MCHC: 35 g/dL (ref 30.0–36.0)
MCV: 93.2 fL (ref 80.0–100.0)
Monocytes Absolute: 1.1 10*3/uL — ABNORMAL HIGH (ref 0.1–1.0)
Monocytes Relative: 8 %
Neutro Abs: 7.9 10*3/uL — ABNORMAL HIGH (ref 1.7–7.7)
Neutrophils Relative %: 60 %
Platelet Count: 272 10*3/uL (ref 150–400)
RBC: 3.83 MIL/uL — ABNORMAL LOW (ref 3.87–5.11)
RDW: 12.8 % (ref 11.5–15.5)
WBC Count: 13.1 10*3/uL — ABNORMAL HIGH (ref 4.0–10.5)
nRBC: 0 % (ref 0.0–0.2)

## 2019-10-20 LAB — TSH: TSH: 2.156 u[IU]/mL (ref 0.308–3.960)

## 2019-10-20 MED ORDER — POTASSIUM CHLORIDE CRYS ER 20 MEQ PO TBCR
40.0000 meq | EXTENDED_RELEASE_TABLET | Freq: Two times a day (BID) | ORAL | 0 refills | Status: DC
Start: 2019-10-20 — End: 2019-11-17

## 2019-10-20 MED ORDER — SODIUM CHLORIDE 0.9% FLUSH
10.0000 mL | INTRAVENOUS | Status: DC | PRN
  Administered 2019-10-20: 10 mL
  Filled 2019-10-20: qty 10

## 2019-10-20 NOTE — Progress Notes (Signed)
Debra [phone]   [phone]  OFFICE PROGRESS NOTE  Debra Filler, MD [place] [address]  DIAGNOSIS: Stage IV non-small cell lung cancer, squamous cell carcinoma. She presented withright upper lobe lung mass in addition to pleural-based metastasis andmediastinal lymphadenopathy.She was diagnosed in September 2020.  PRIOR THERAPY: None  CURRENT THERAPY: Chemotherapy with carboplatin for an AUC of 5,paclitaxel 175 mg/m, and Keytruda 200 mg IV every 3 weeks with Neulasta support. Firsttreatmenton 12/09/2018.Status post14cycles.  Starting from cycle #5 she is currently on maintenance treatment with single agent Keytruda every 3 weeks.  INTERVAL HISTORY: Debra Rodgers 74 y.o. female returns to the clinic today for follow-up visit.  The patient is feeling fine today with no concerning complaints except for intermittent abdominal pain as well as shortness of breath with exertion.  She was admitted to [facility] long hospital a week ago with COPD exacerbation and shortness of breath.  She was treated with steroids and she is currently on prednisone 40 mg p.o. daily.  The patient denied having any current chest pain but has mild cough with no hemoptysis.  She denied having any fever or chills.  She has no nausea, vomiting, diarrhea or constipation.  She was supposed to start cycle #16 today.  MEDICAL HISTORY: Past Medical History:  Diagnosis Date  . COPD (chronic obstructive pulmonary disease) ([place])   . Depression   . Essential hypertension   . Headache   . History of migraine headaches   . lung ca dx'd 10/2018  . Sleep apnea   . Tobacco use disorder     ALLERGIES:  is allergic to codeine sulfate and pantoprazole sodium.  MEDICATIONS:  Current Outpatient Medications  Medication Sig Dispense Refill  . albuterol (VENTOLIN HFA) 108 (90 Base) MCG/ACT inhaler Inhale 2 puffs into the lungs every 6 (six)  hours as needed for wheezing or shortness of breath. 36 g 2  . amLODipine (NORVASC) 10 MG tablet Take 1 tablet (10 mg total) by mouth daily. 90 tablet 3  . atorvastatin (LIPITOR) 20 MG tablet Take 1 tablet (20 mg total) by mouth daily. 90 tablet 3  . benzonatate (TESSALON) 100 MG capsule Take 1 capsule (100 mg total) by mouth every 8 (eight) hours as needed for cough. 20 capsule 0  . CALCIUM PO Take 1 tablet by mouth daily.    . chlorpheniramine-HYDROcodone (TUSSIONEX) 10-8 MG/5ML SUER Take 5 mLs by mouth every 12 (twelve) hours as needed for cough. 70 mL 0  . chlorthalidone (HYGROTON) 50 MG tablet Take 1 tablet (50 mg total) by mouth daily. 90 tablet 1  . famotidine (PEPCID) 20 MG tablet Take 20 mg by mouth daily.     Marland Kitchen FLUoxetine (PROZAC) 20 MG capsule Take 1 capsule (20 mg total) by mouth daily. 30 capsule 5  . fluticasone (FLONASE) 50 MCG/ACT nasal spray Place 1 spray into both nostrils daily. 11.1 mL 0  . Fluticasone-Umeclidin-Vilant (TRELEGY ELLIPTA) 200-62.5-25 MCG/INH AEPB One inhalation once daily 60 each 2  . guaiFENesin-dextromethorphan (ROBITUSSIN DM) 100-10 MG/5ML syrup Take 5 mLs by mouth every 4 (four) hours as needed for cough. (Patient not taking: Reported on 10/10/2019) 118 mL 0  . HYDROcodone-acetaminophen (NORCO) 7.5-325 MG tablet Take 1 tablet by mouth every 6 (six) hours as needed for moderate pain. 60 tablet 0  . lidocaine-prilocaine (EMLA) cream Apply 1 application topically as needed. (Patient taking differently: Apply 1 application topically as needed (port access). )  30 g 0  . polyvinyl alcohol (LIQUIFILM TEARS) 1.4 % ophthalmic solution Place 1 drop into both eyes as needed for dry eyes.    . predniSONE (DELTASONE) 20 MG tablet Take 2 tablets (40 mg total) by mouth daily with breakfast for 10 days. 20 tablet 0  . rivaroxaban (XARELTO) 20 MG TABS tablet Take 1 tablet (20 mg total) by mouth daily with supper. 30 tablet 2   No current facility-administered medications for  this visit.   Facility-Administered Medications Ordered in Other Visits  Medication Dose Route Frequency Provider Last Rate Last Admin  . sodium chloride flush (NS) 0.9 % injection 10 mL  10 mL Intracatheter PRN Curt Bears, MD   10 mL at 10/20/19 0254    SURGICAL HISTORY:  Past Surgical History:  Procedure Laterality Date  . ABDOMINAL HYSTERECTOMY    . CHOLECYSTECTOMY    . IR IMAGING GUIDED PORT INSERTION  02/24/2019  . ROTATOR CUFF REPAIR  3/04  . VIDEO BRONCHOSCOPY WITH ENDOBRONCHIAL ULTRASOUND Right 11/25/2018   Procedure: VIDEO BRONCHOSCOPY WITH ENDOBRONCHIAL ULTRASOUND;  Surgeon: Collene Gobble, MD;  Location: [facility] OR;  Service: Thoracic;  Laterality: Right;    REVIEW OF SYSTEMS:  A comprehensive review of systems was negative except for: Constitutional: positive for fatigue Respiratory: positive for dyspnea on exertion Gastrointestinal: positive for abdominal pain   PHYSICAL EXAMINATION: General appearance: alert, cooperative and no distress Head: Normocephalic, without obvious abnormality, atraumatic Neck: no adenopathy, no JVD, supple, symmetrical, trachea midline and thyroid not enlarged, symmetric, no tenderness/mass/nodules Lymph nodes: Cervical, supraclavicular, and axillary nodes normal. Resp: clear to auscultation bilaterally Back: symmetric, no curvature. ROM normal. No CVA tenderness. Cardio: regular rate and rhythm, S1, S2 normal, no murmur, click, rub or gallop GI: soft, non-tender; bowel sounds normal; no masses,  no organomegaly Extremities: extremities normal, atraumatic, no cyanosis or edema  ECOG PERFORMANCE STATUS: 1 - Symptomatic but completely ambulatory  Blood pressure 123/83, pulse 73, temperature 97.9 F (36.6 C), temperature source Temporal, resp. rate 18, height 5' 1.5" (1.562 m), weight 136 lb 9.6 oz (62 kg), SpO2 100 %.  LABORATORY DATA: Lab Results  Component Value Date   WBC 8.5 10/16/2019   HGB 11.8 (L) 10/16/2019   HCT 35.4 (L)  10/16/2019   MCV 98.6 10/16/2019   PLT 233 10/16/2019      Chemistry      Component Value Date/Time   NA 136 10/16/2019 0400   NA 139 07/21/2017 0958   K 4.2 10/16/2019 0400   CL 93 (L) 10/16/2019 0400   CO2 33 (H) 10/16/2019 0400   BUN 38 (H) 10/16/2019 0400   BUN 26 07/21/2017 0958   CREATININE 1.03 (H) 10/16/2019 0400   CREATININE 1.15 (H) 09/29/2019 1020   CREATININE 0.79 09/03/2013 1540      Component Value Date/Time   CALCIUM 9.4 10/16/2019 0400   ALKPHOS 55 10/16/2019 0400   AST 25 10/16/2019 0400   AST 14 (L) 09/29/2019 1020   ALT 29 10/16/2019 0400   ALT 13 09/29/2019 1020   BILITOT 0.5 10/16/2019 0400   BILITOT 0.6 09/29/2019 1020       RADIOGRAPHIC STUDIES: DG Chest 2 View  Result Date: 10/10/2019 CLINICAL DATA:  Shortness of breath and COPD. EXAM: CHEST - 2 VIEW COMPARISON:  Aug 23, 2019 FINDINGS: Injectable port in stable position. Mildly enlarged cardiac silhouette. Aneurysmal dilation of the aorta better demonstrated on recent chest CT. Mild coarsening of the interstitial markings. No focal airspace consolidation. Chronic elevation  of the left hemidiaphragm. Osseous structures are without acute abnormality. Soft tissues are grossly normal. IMPRESSION: 1. Mild coarsening of the interstitial markings. 2. No focal airspace consolidation. 3. Aneurysmal dilation of the aorta better demonstrated on recent chest CT. Electronically Signed   By: Fidela Salisbury M.D.   On: 10/10/2019 12:45   CT SOFT TISSUE NECK WO CONTRAST  Result Date: 10/13/2019 CLINICAL DATA:  Laryngeal spasm. Wheezing. Assess for laryngeal edema. EXAM: CT NECK WITHOUT CONTRAST TECHNIQUE: Multidetector CT imaging of the neck was performed following the standard protocol without intravenous contrast. COMPARISON:  None. FINDINGS: Pharynx and larynx: The study suffers from motion degradation, particularly in the region of the larynx. There may be a degree of voluntary glottic closure. There does not  appear to be definite mucosal or submucosal edema in the region. Cannot evaluate for a small mass or polyp given the closed glottis. Salivary glands: Parotid and submandibular glands are normal. Thyroid: Normal Lymph nodes: No enlarged or low-density nodes on either side of the neck detected on this noncontrast study. Vascular: No vascular pathology evident other than aortic atherosclerosis. Port-A-Cath in place on the right. Limited intracranial: Normal Visualized orbits: Normal Mastoids and visualized paranasal sinuses: No significant sinus disease. Skeleton: Previous cervical fusion C3 through C5. Degenerative spondylosis above and below that. Prominent torus changes of maxilla in the region of the palate, more extensive on the left than the right. Upper chest: Emphysema.  No focal or active process. Other: None IMPRESSION: The study suffers from motion degradation. Additionally, the glottis appears to be held in likely voluntary closure. There does not appear to be gross edema of the mucosal or submucosal tissues in that region and therefore I assume the closed appearance is voluntary. If not, there could be a mild degree of laryngeal edema. No evidence of mass or lymphadenopathy. Electronically Signed   By: Nelson Chimes M.D.   On: 10/13/2019 19:20   CT CHEST W CONTRAST  Result Date: 10/17/2019 CLINICAL DATA:  74 year old female with history of non-small cell lung cancer presenting with worsening shortness of breath and cough. Undergoing chemotherapy. EXAM: CT CHEST, ABDOMEN, AND PELVIS WITH CONTRAST TECHNIQUE: Multidetector CT imaging of the chest, abdomen and pelvis was performed following the standard protocol during bolus administration of intravenous contrast. CONTRAST:  150m OMNIPAQUE IOHEXOL 300 MG/ML  SOLN COMPARISON:  CT of the chest abdomen pelvis dated 07/27/2019 FINDINGS: CT CHEST FINDINGS Cardiovascular: There is no cardiomegaly or pericardial effusion. Three-vessel coronary vascular  calcification. There is moderate atherosclerotic calcification of the thoracic aorta. 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 The central pulmonary arteries appear patent. Right-sided Port-A-Cath with tip at the cavoatrial junction. Mediastinum/Nodes: No hilar or mediastinal adenopathy. Overall interval decrease in the size of the mediastinal lymph node compared to the prior CT of 08/16/2019. The esophagus is grossly unremarkable. No mediastinal fluid collection. Lungs/Pleura: Background of emphysema. There is a 12 mm nodule with irregular margins in the superior segment of the left lower lobe (68/6) which appears new since the prior CT. Several small nodular densities seen in the left lower lobe on the prior CT are not visualized on the current exam. A 2 mm subpleural nodule in the left upper lobe (57/6) indeterminate. There are bibasilar atelectasis/scarring. There is mild eventration of the left hemidiaphragm with left lung base atelectasis. There is no lobar consolidation, pleural effusion, or pneumothorax. The central airways are patent. Musculoskeletal:  Degenerative changes of the spine. No acute osseous pathology. CT ABDOMEN PELVIS FINDINGS No intra-abdominal free air or free fluid. Hepatobiliary: Several small hepatic hypodense lesions similar to prior CT. The larger lesion in the right lobe of the liver demonstrates fluid attenuation consistent with a cyst. Additional smaller lesions are too small to characterize. There is mild intrahepatic biliary ductal dilatation, likely post cholecystectomy. Pancreas: Unremarkable. No pancreatic ductal dilatation or surrounding inflammatory changes. Spleen: Normal in size without focal abnormality. Adrenals/Urinary Tract: The adrenal glands unremarkable. Mild bilateral renal parenchyma atrophy. There is no hydronephrosis on either side. There is symmetric enhancement and  excretion of contrast by both kidneys. The visualized ureters and urinary bladder appear unremarkable. Stomach/Bowel: There is sigmoid diverticulosis without active inflammatory changes. There is no bowel obstruction or active inflammation. Vascular/Lymphatic: Advanced aortoiliac atherosclerotic disease. Diffuse aneurysmal dilatation of the abdominal aorta measuring up to 4 cm in diameter similar to prior CT. The IVC is unremarkable. No portal venous gas. There is no adenopathy. Reproductive: Hysterectomy. A 7.4 cm left adnexal cyst similar to prior CT. Further evaluation with MRI on a nonemergent basis recommended. Other: Midline vertical anterior pelvic wall incisional scar. Musculoskeletal: Degenerative changes of the spine. No acute osseous pathology. No suspicious bone lesions. IMPRESSION: 1. Interval development of a 12 mm irregular nodule in the superior segment of the left lower lobe concerning for malignancy/metastasis. Additional smaller nodules seen on the prior CT are not visualized on today's exam. 2. Overall interval decrease in the size of the mediastinal lymph node compared to the prior CT. 3. No acute intra-abdominal or pelvic pathology. No evidence of metastatic disease in the abdomen and pelvis. 4. Sigmoid diverticulosis. No bowel obstruction. 5. A 7.4 cm left adnexal cyst similar to prior CT. Further evaluation with MRI on a nonemergent basis recommended. 6. Aortic Atherosclerosis (ICD10-I70.0) and Emphysema (ICD10-J43.9). Electronically Signed   By: Anner Crete M.D.   On: 10/17/2019 16:20   CT ABDOMEN PELVIS W CONTRAST  Result Date: 10/17/2019 CLINICAL DATA:  74 year old female with history of non-small cell lung cancer presenting with worsening shortness of breath and cough. Undergoing chemotherapy. EXAM: CT CHEST, ABDOMEN, AND PELVIS WITH CONTRAST TECHNIQUE: Multidetector CT imaging of the chest, abdomen and pelvis was performed following the standard protocol during bolus  administration of intravenous contrast. CONTRAST:  136m OMNIPAQUE IOHEXOL 300 MG/ML  SOLN COMPARISON:  CT of the chest abdomen pelvis dated 07/27/2019 FINDINGS: CT CHEST FINDINGS Cardiovascular: There is no cardiomegaly or pericardial effusion. Three-vessel coronary vascular calcification. There is moderate atherosclerotic calcification of the thoracic aorta. 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 The central pulmonary arteries appear patent. Right-sided Port-A-Cath with tip at the cavoatrial junction. Mediastinum/Nodes: No hilar or mediastinal adenopathy. Overall interval decrease in the size of the mediastinal lymph node compared to the prior CT of 08/16/2019. The esophagus is grossly unremarkable. No mediastinal fluid collection. Lungs/Pleura: Background of emphysema. There is a 12 mm nodule with irregular margins in the superior segment of the left lower lobe (68/6) which appears new since the prior CT. Several small nodular densities seen in the left lower lobe on the prior CT are not visualized on the current exam. A 2 mm subpleural nodule in the left upper lobe (57/6) indeterminate. There are bibasilar atelectasis/scarring. There is mild eventration of the left hemidiaphragm with left lung base atelectasis. There is no lobar consolidation, pleural effusion, or pneumothorax. The central  airways are patent. Musculoskeletal: Degenerative changes of the spine. No acute osseous pathology. CT ABDOMEN PELVIS FINDINGS No intra-abdominal free air or free fluid. Hepatobiliary: Several small hepatic hypodense lesions similar to prior CT. The larger lesion in the right lobe of the liver demonstrates fluid attenuation consistent with a cyst. Additional smaller lesions are too small to characterize. There is mild intrahepatic biliary ductal dilatation, likely post cholecystectomy. Pancreas: Unremarkable. No pancreatic  ductal dilatation or surrounding inflammatory changes. Spleen: Normal in size without focal abnormality. Adrenals/Urinary Tract: The adrenal glands unremarkable. Mild bilateral renal parenchyma atrophy. There is no hydronephrosis on either side. There is symmetric enhancement and excretion of contrast by both kidneys. The visualized ureters and urinary bladder appear unremarkable. Stomach/Bowel: There is sigmoid diverticulosis without active inflammatory changes. There is no bowel obstruction or active inflammation. Vascular/Lymphatic: Advanced aortoiliac atherosclerotic disease. Diffuse aneurysmal dilatation of the abdominal aorta measuring up to 4 cm in diameter similar to prior CT. The IVC is unremarkable. No portal venous gas. There is no adenopathy. Reproductive: Hysterectomy. A 7.4 cm left adnexal cyst similar to prior CT. Further evaluation with MRI on a nonemergent basis recommended. Other: Midline vertical anterior pelvic wall incisional scar. Musculoskeletal: Degenerative changes of the spine. No acute osseous pathology. No suspicious bone lesions. IMPRESSION: 1. Interval development of a 12 mm irregular nodule in the superior segment of the left lower lobe concerning for malignancy/metastasis. Additional smaller nodules seen on the prior CT are not visualized on today's exam. 2. Overall interval decrease in the size of the mediastinal lymph node compared to the prior CT. 3. No acute intra-abdominal or pelvic pathology. No evidence of metastatic disease in the abdomen and pelvis. 4. Sigmoid diverticulosis. No bowel obstruction. 5. A 7.4 cm left adnexal cyst similar to prior CT. Further evaluation with MRI on a nonemergent basis recommended. 6. Aortic Atherosclerosis (ICD10-I70.0) and Emphysema (ICD10-J43.9). Electronically Signed   By: Anner Crete M.D.   On: 10/17/2019 16:20   ECHOCARDIOGRAM COMPLETE  Result Date: 10/13/2019    ECHOCARDIOGRAM REPORT   Patient Name:   Debra Rodgers Date of Exam:  10/13/2019 Medical Rec #:  [id number]     Height:       61.5 in Accession #:    [id number]    Weight:       135.1 lb Date of Birth:  10-Aug-1945     BSA:          1.609 m Patient Age:    21 years      BP:           152/91 mmHg Patient Gender: F             HR:           86 bpm. Exam Location:  Inpatient Procedure: 2D Echo, Cardiac Doppler and Color Doppler Indications:    I50.23 Acute on chronic systolic (congestive) heart failure  History:        Patient has no prior history of Echocardiogram examinations.                 COPD; Risk Factors:Former Smoker, Hypertension and Sleep Apnea.  Sonographer:    Tiffany Dance Referring Phys: [place]  1. Left ventricular ejection fraction, by estimation, is 65 to 70%. The left ventricle has normal function. The left ventricle has no regional wall motion abnormalities. There is moderate focal basal septal left ventricular hypertrophy. Left ventricular  diastolic parameters are consistent with Grade I diastolic dysfunction (impaired  relaxation). There was turbulence across the LV outflow tract but no significant LVOT gradient was measured.  2. Right ventricular systolic function is normal. The right ventricular size is normal. There is normal pulmonary artery systolic pressure. The estimated right ventricular systolic pressure is 40.9 mmHg.  3. The mitral valve is normal in structure. No evidence of mitral valve regurgitation. No evidence of mitral stenosis.  4. The aortic valve is tricuspid. Aortic valve regurgitation is not visualized. No aortic stenosis is present.  5. Aortic dilatation noted. There is mild dilatation of the ascending aorta measuring 37 mm.  6. The inferior vena cava is normal in size with greater than 50% respiratory variability, suggesting right atrial pressure of 3 mmHg. FINDINGS  Left Ventricle: Left ventricular ejection fraction, by estimation, is 65 to 70%. The left ventricle has normal function. The left ventricle has no  regional wall motion abnormalities. The left ventricular internal cavity size was normal in size. There is  moderate focal basal septal left ventricular hypertrophy. Left ventricular diastolic parameters are consistent with Grade I diastolic dysfunction (impaired relaxation). Right Ventricle: The right ventricular size is normal. No increase in right ventricular wall thickness. Right ventricular systolic function is normal. There is normal pulmonary artery systolic pressure. The tricuspid regurgitant velocity is 2.39 m/s, and  with an assumed right atrial pressure of 3 mmHg, the estimated right ventricular systolic pressure is 81.1 mmHg. Left Atrium: Left atrial size was normal in size. Right Atrium: Right atrial size was normal in size. Pericardium: There is no evidence of pericardial effusion. Mitral Valve: The mitral valve is normal in structure. No evidence of mitral valve regurgitation. No evidence of mitral valve stenosis. Tricuspid Valve: The tricuspid valve is normal in structure. Tricuspid valve regurgitation is trivial. Aortic Valve: The aortic valve is tricuspid. Aortic valve regurgitation is not visualized. No aortic stenosis is present. Pulmonic Valve: The pulmonic valve was normal in structure. Pulmonic valve regurgitation is not visualized. Aorta: The aortic root is normal in size and structure and aortic dilatation noted. There is mild dilatation of the ascending aorta measuring 37 mm. Venous: The inferior vena cava is normal in size with greater than 50% respiratory variability, suggesting right atrial pressure of 3 mmHg. IAS/Shunts: No atrial level shunt detected by color flow Doppler.  LEFT VENTRICLE PLAX 2D LVIDd:         3.70 cm  Diastology LVIDs:         2.50 cm  LV e' lateral:   7.29 cm/s LV PW:         1.00 cm  LV E/e' lateral: 9.7 LV IVS:        1.40 cm  LV e' medial:    5.66 cm/s LVOT diam:     1.80 cm  LV E/e' medial:  12.4 LV SV:         86 LV SV Index:   53 LVOT Area:     2.54 cm  RIGHT  VENTRICLE             IVC RV Basal diam:  2.40 cm     IVC diam: 1.90 cm RV S prime:     20.30 cm/s TAPSE (M-mode): 1.9 cm LEFT ATRIUM             Index       RIGHT ATRIUM          Index LA diam:        2.70 cm 1.68 cm/m  RA Area:  9.46 cm LA Vol (A2C):   61.4 ml 38.17 ml/m RA Volume:   16.90 ml 10.51 ml/m LA Vol (A4C):   43.5 ml 27.04 ml/m LA Biplane Vol: 51.5 ml 32.01 ml/m  AORTIC VALVE LVOT Vmax:   179.00 cm/s LVOT Vmean:  113.000 cm/s LVOT VTI:    0.338 m  AORTA Ao Root diam: 3.40 cm Ao Asc diam:  3.70 cm MITRAL VALVE               TRICUSPID VALVE MV Area (PHT): 2.32 cm    TR Peak grad:   22.8 mmHg MV Decel Time: 327 msec    TR Vmax:        239.00 cm/s MV E velocity: 70.40 cm/s MV A velocity: 84.60 cm/s  SHUNTS MV E/A ratio:  0.83        Systemic VTI:  0.34 m                            Systemic Diam: 1.80 cm Loralie Champagne MD Electronically signed by Loralie Champagne MD Signature Date/Time: 10/13/2019/5:05:21 PM    Final     ASSESSMENT AND PLAN: This is a very pleasant 74 years old African-American female with stage IV non-small cell carcinoma,, squamous cell carcinoma diagnosed in September 2020.  She presented with extensive right-sided pleural and thoracic nodal hypermetabolic disease with no extrathoracic disease. The patient started induction treatment with systemic chemotherapy with carboplatin, paclitaxel and Keytruda status post 4 cycles with partial response after cycle #4.  She is currently undergoing maintenance treatment with single agent Keytruda status post 11 cycles. The patient has been tolerating her treatment well with no concerning complaints.  She was recently admitted to the hospital with COPD exacerbation and she is currently on prednisone 40 mg p.o. daily. I recommended for the patient to delay the start of cycle #12 of her maintenance therapy by 1 week until she taper her prednisone dose. She is expected to resume her treatment in 1 week and will have a follow-up appointment at  that time. For the history of deep venous thrombosis, I will give her a refill of Xarelto. The patient was advised to call immediately if she has any concerning symptoms in the interval. The patient voices understanding of current disease status and treatment options and is in agreement with the current care plan. All questions were answered. The patient knows to call the clinic with any problems, questions or concerns. We can certainly see the patient much sooner if necessary.  Disclaimer: This note was dictated with voice recognition software. Similar sounding words can inadvertently be transcribed and may not be corrected upon review.

## 2019-10-20 NOTE — Telephone Encounter (Signed)
Per Dr.Mohamed, called to notify pt of new px for potassium was sent to preferred pharmacy. After making several attempts, left vm with px directions. Advised to call office for any other concerns.

## 2019-10-20 NOTE — Progress Notes (Signed)
CRITICAL VALUE STICKER  CRITICAL VALUE: K+ 2.9  RECEIVER (on-site recipient of call): Manuela Schwartz, [place] NOTIFIED: 10/20/19 @ 0857  MESSENGER (representative from lab): Pam  MD NOTIFIED: Dr. Julien Nordmann   TIME OF NOTIFICATION: 0900  RESPONSE: Being seen by MD currently

## 2019-10-20 NOTE — Patient Instructions (Signed)

## 2019-10-25 NOTE — Progress Notes (Signed)
[facility] OFFICE PROGRESS NOTE  [name], MD [address] [place] [address]  DIAGNOSIS: Stage IV non-small cell lung cancer, squamous cell carcinoma. She presented withright upper lobe lung mass in addition to pleural-based metastasis andmediastinal lymphadenopathy.She was diagnosed in September 2020.  PRIOR THERAPY: None  CURRENT THERAPY: Chemotherapy with carboplatin for an AUC of 5,paclitaxel 175 mg/m, and Keytruda 200 mg IV every 3 weeks with Neulasta support. Firsttreatmenton 12/09/2018.Status post15cycles. Starting from cycle #5 she is currently on maintenance treatment with single agent Keytruda every 3 weeks.  INTERVAL HISTORY: Debra Rodgers 74 y.o. female returns to the clinic for a follow up visit. The patient is feeling well today without any concerning complaints. Her treatment was delayed by 1 week until she can complete her prednisone taper for her recent hospitalization for a COPD exacerbation. She took 40 mg po today. This was reportedly her last day. She states her symptoms are greatly improved at this time. The patient continues to tolerate treatment with single agent Keytruda well without any adverse side effects. Denies any fever, chills, night sweats, or weight loss. Denies any chest pain, cough, or hemoptysis but reports baseline dyspnea. She is prescribed inhalers for her COPD/wheezing. Denies any nausea, vomiting, diarrhea, or constipation. She states she has had a frontal headache recently. She states she took Norco and it resolved her symptoms. Denies any rashes or skin changes. The patient is here today for evaluation prior to starting cycle # 16   MEDICAL HISTORY: Past Medical History:  Diagnosis Date  . COPD (chronic obstructive pulmonary disease) ([place])   . Depression   . Essential hypertension   . Headache   . History of migraine headaches   . lung ca dx'd 10/2018  . Sleep apnea   . Tobacco use disorder      ALLERGIES:  is allergic to codeine sulfate and pantoprazole sodium.  MEDICATIONS:  Current Outpatient Medications  Medication Sig Dispense Refill  . albuterol (VENTOLIN HFA) 108 (90 Base) MCG/ACT inhaler Inhale 2 puffs into the lungs every 6 (six) hours as needed for wheezing or shortness of breath. 36 g 2  . amLODipine (NORVASC) 10 MG tablet Take 1 tablet (10 mg total) by mouth daily. 90 tablet 3  . atorvastatin (LIPITOR) 20 MG tablet Take 1 tablet (20 mg total) by mouth daily. 90 tablet 3  . benzonatate (TESSALON) 100 MG capsule Take 1 capsule (100 mg total) by mouth every 8 (eight) hours as needed for cough. 20 capsule 0  . CALCIUM PO Take 1 tablet by mouth daily.    . chlorpheniramine-HYDROcodone (TUSSIONEX) 10-8 MG/5ML SUER Take 5 mLs by mouth every 12 (twelve) hours as needed for cough. 70 mL 0  . chlorthalidone (HYGROTON) 50 MG tablet Take 1 tablet (50 mg total) by mouth daily. 90 tablet 1  . famotidine (PEPCID) 20 MG tablet Take 20 mg by mouth daily.     Marland Kitchen FLUoxetine (PROZAC) 20 MG capsule Take 1 capsule (20 mg total) by mouth daily. 30 capsule 5  . fluticasone (FLONASE) 50 MCG/ACT nasal spray Place 1 spray into both nostrils daily. 11.1 mL 0  . Fluticasone-Umeclidin-Vilant (TRELEGY ELLIPTA) 200-62.5-25 MCG/INH AEPB One inhalation once daily 60 each 2  . guaiFENesin-dextromethorphan (ROBITUSSIN DM) 100-10 MG/5ML syrup Take 5 mLs by mouth every 4 (four) hours as needed for cough. 118 mL 0  . HYDROcodone-acetaminophen (NORCO) 7.5-325 MG tablet Take 1 tablet by mouth every 6 (six) hours as needed for moderate  pain. 60 tablet 0  . lidocaine-prilocaine (EMLA) cream Apply 1 application topically as needed. (Patient taking differently: Apply 1 application topically as needed (port access). ) 30 g 0  . polyvinyl alcohol (LIQUIFILM TEARS) 1.4 % ophthalmic solution Place 1 drop into both eyes as needed for dry eyes.    . potassium chloride SA (KLOR-CON) 20 MEQ tablet Take 2 tablets (40 mEq  total) by mouth 2 (two) times daily. 20 tablet 0  . predniSONE (DELTASONE) 20 MG tablet Take 2 tablets (40 mg total) by mouth daily with breakfast for 10 days. 20 tablet 0  . rivaroxaban (XARELTO) 20 MG TABS tablet Take 1 tablet (20 mg total) by mouth daily with supper. 30 tablet 2   No current facility-administered medications for this visit.   Facility-Administered Medications Ordered in Other Visits  Medication Dose Route Frequency Provider Last Rate Last Admin  . heparin lock flush 100 unit/mL  500 Units Intracatheter Once PRN Curt Bears, MD      . pembrolizumab [facility]) 200 mg in sodium chloride 0.9 % 50 mL chemo infusion  200 mg Intravenous Once Curt Bears, MD      . sodium chloride flush (NS) 0.9 % injection 10 mL  10 mL Intracatheter PRN Curt Bears, MD        SURGICAL HISTORY:  Past Surgical History:  Procedure Laterality Date  . ABDOMINAL HYSTERECTOMY    . CHOLECYSTECTOMY    . IR IMAGING GUIDED PORT INSERTION  02/24/2019  . ROTATOR CUFF REPAIR  3/04  . VIDEO BRONCHOSCOPY WITH ENDOBRONCHIAL ULTRASOUND Right 11/25/2018   Procedure: VIDEO BRONCHOSCOPY WITH ENDOBRONCHIAL ULTRASOUND;  Surgeon: Collene Gobble, MD;  Location: [facility] OR;  Service: Thoracic;  Laterality: Right;    REVIEW OF SYSTEMS:   Constitutional: Negative for appetite change, chills, fatigue, fever and unexpected weight change.  HENT: Negative for mouth sores, nosebleeds, sore throat and trouble swallowing.  Eyes: Negative fordiplopia, erythema, oricterus.  Respiratory:Positive for baseline shortness of breath on exertion. Negative for hemoptysis, cough, or wheezing. Cardiovascular:Negative for chest pain or leg swelling.  Gastrointestinal: Negative for abdominal pain, constipation, diarrhea, nausea and vomiting.  Genitourinary: Negative for bladder incontinence, difficulty urinating, dysuria, frequency and hematuria.  Musculoskeletal: Negative for back pain, gait problem, neck pain and neck  stiffness.  Skin: Negative for itching and rash.  Neurological:Positive for mild occasional headaches.Negative for dizziness, extremity weakness, gait problem, light-headedness and seizures.  Hematological: Negative for adenopathy. Does not bruise/bleed easily.  Psychiatric/Behavioral: Negative for confusion, depression and sleep disturbance. The patient is not nervous/anxious.   PHYSICAL EXAMINATION:  Blood pressure 129/82, pulse 74, temperature (!) 97.5 F (36.4 C), temperature source Temporal, resp. rate 18, height 5' 1.5" (1.562 m), weight 137 lb 14.4 oz (62.6 kg), SpO2 98 %.  ECOG PERFORMANCE STATUS: 1 - Symptomatic but completely ambulatory  Physical Exam  Constitutional: Oriented to person, place, and time and well-developed, well-nourished, and in no distress.  HENT:  Head: Normocephalic and atraumatic.  Mouth/Throat: Oropharynx is clear and moist. No oropharyngeal exudate.  Eyes: Conjunctivae are normal. Right eye exhibits no discharge. Left eye exhibits no discharge. No scleral icterus.  Neck: Normal range of motion. Neck supple.  Cardiovascular: Normal rate, regular rhythm, normal heart sounds and intact distal pulses.   Pulmonary/Chest: Effort normal and breath sounds normal. No respiratory distress. No wheezes. No rales.  Abdominal: Soft. Bowel sounds are normal. Exhibits no distension and no mass. There is no tenderness.  Musculoskeletal: Normal range of motion. Exhibits no edema.  Lymphadenopathy:    No cervical adenopathy.  Neurological: Alert and oriented to person, place, and time. Exhibits normal muscle tone. Gait normal. Coordination normal.  Skin: Skin is warm and dry. No rash noted. Not diaphoretic. No erythema. No pallor.  Psychiatric: Mood, memory and judgment normal.  Vitals reviewed.  LABORATORY DATA: Lab Results  Component Value Date   WBC 10.4 10/27/2019   HGB 12.0 10/27/2019   HCT 34.8 (L) 10/27/2019   MCV 94.6 10/27/2019   PLT 239 10/27/2019       Chemistry      Component Value Date/Time   NA 136 10/27/2019 1400   NA 139 07/21/2017 0958   K 3.0 (LL) 10/27/2019 1400   CL 96 (L) 10/27/2019 1400   CO2 33 (H) 10/27/2019 1400   BUN 29 (H) 10/27/2019 1400   BUN 26 07/21/2017 0958   CREATININE 0.99 10/27/2019 1400   CREATININE 0.79 09/03/2013 1540      Component Value Date/Time   CALCIUM 9.7 10/27/2019 1400   ALKPHOS 69 10/27/2019 1400   AST 24 10/27/2019 1400   ALT 51 (H) 10/27/2019 1400   BILITOT 1.2 10/27/2019 1400       RADIOGRAPHIC STUDIES:  DG Chest 2 View  Result Date: 10/10/2019 CLINICAL DATA:  Shortness of breath and COPD. EXAM: CHEST - 2 VIEW COMPARISON:  Aug 23, 2019 FINDINGS: Injectable port in stable position. Mildly enlarged cardiac silhouette. Aneurysmal dilation of the aorta better demonstrated on recent chest CT. Mild coarsening of the interstitial markings. No focal airspace consolidation. Chronic elevation of the left hemidiaphragm. Osseous structures are without acute abnormality. Soft tissues are grossly normal. IMPRESSION: 1. Mild coarsening of the interstitial markings. 2. No focal airspace consolidation. 3. Aneurysmal dilation of the aorta better demonstrated on recent chest CT. Electronically Signed   By: Fidela Salisbury M.D.   On: 10/10/2019 12:45   CT SOFT TISSUE NECK WO CONTRAST  Result Date: 10/13/2019 CLINICAL DATA:  Laryngeal spasm. Wheezing. Assess for laryngeal edema. EXAM: CT NECK WITHOUT CONTRAST TECHNIQUE: Multidetector CT imaging of the neck was performed following the standard protocol without intravenous contrast. COMPARISON:  None. FINDINGS: Pharynx and larynx: The study suffers from motion degradation, particularly in the region of the larynx. There may be a degree of voluntary glottic closure. There does not appear to be definite mucosal or submucosal edema in the region. Cannot evaluate for a small mass or polyp given the closed glottis. Salivary glands: Parotid and submandibular  glands are normal. Thyroid: Normal Lymph nodes: No enlarged or low-density nodes on either side of the neck detected on this noncontrast study. Vascular: No vascular pathology evident other than aortic atherosclerosis. Port-A-Cath in place on the right. Limited intracranial: Normal Visualized orbits: Normal Mastoids and visualized paranasal sinuses: No significant sinus disease. Skeleton: Previous cervical fusion C3 through C5. Degenerative spondylosis above and below that. Prominent torus changes of maxilla in the region of the palate, more extensive on the left than the right. Upper chest: Emphysema.  No focal or active process. Other: None IMPRESSION: The study suffers from motion degradation. Additionally, the glottis appears to be held in likely voluntary closure. There does not appear to be gross edema of the mucosal or submucosal tissues in that region and therefore I assume the closed appearance is voluntary. If not, there could be a mild degree of laryngeal edema. No evidence of mass or lymphadenopathy. Electronically Signed   By: Nelson Chimes M.D.   On: 10/13/2019 19:20   CT CHEST W  CONTRAST  Result Date: 10/17/2019 CLINICAL DATA:  74 year old female with history of non-small cell lung cancer presenting with worsening shortness of breath and cough. Undergoing chemotherapy. EXAM: CT CHEST, ABDOMEN, AND PELVIS WITH CONTRAST TECHNIQUE: Multidetector CT imaging of the chest, abdomen and pelvis was performed following the standard protocol during bolus administration of intravenous contrast. CONTRAST:  155m OMNIPAQUE IOHEXOL 300 MG/ML  SOLN COMPARISON:  CT of the chest abdomen pelvis dated 07/27/2019 FINDINGS: CT CHEST FINDINGS Cardiovascular: There is no cardiomegaly or pericardial effusion. Three-vessel coronary vascular calcification. There is moderate atherosclerotic calcification of the thoracic aorta. 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 The central pulmonary arteries appear patent. Right-sided Port-A-Cath with tip at the cavoatrial junction. Mediastinum/Nodes: No hilar or mediastinal adenopathy. Overall interval decrease in the size of the mediastinal lymph node compared to the prior CT of 08/16/2019. The esophagus is grossly unremarkable. No mediastinal fluid collection. Lungs/Pleura: Background of emphysema. There is a 12 mm nodule with irregular margins in the superior segment of the left lower lobe (68/6) which appears new since the prior CT. Several small nodular densities seen in the left lower lobe on the prior CT are not visualized on the current exam. A 2 mm subpleural nodule in the left upper lobe (57/6) indeterminate. There are bibasilar atelectasis/scarring. There is mild eventration of the left hemidiaphragm with left lung base atelectasis. There is no lobar consolidation, pleural effusion, or pneumothorax. The central airways are patent. Musculoskeletal: Degenerative changes of the spine. No acute osseous pathology. CT ABDOMEN PELVIS FINDINGS No intra-abdominal free air or free fluid. Hepatobiliary: Several small hepatic hypodense lesions similar to prior CT. The larger lesion in the right lobe of the liver demonstrates fluid attenuation consistent with a cyst. Additional smaller lesions are too small to characterize. There is mild intrahepatic biliary ductal dilatation, likely post cholecystectomy. Pancreas: Unremarkable. No pancreatic ductal dilatation or surrounding inflammatory changes. Spleen: Normal in size without focal abnormality. Adrenals/Urinary Tract: The adrenal glands unremarkable. Mild bilateral renal parenchyma atrophy. There is no hydronephrosis on either side. There is symmetric enhancement and excretion of contrast by both kidneys. The visualized ureters and urinary bladder appear unremarkable. Stomach/Bowel: There is sigmoid diverticulosis without active inflammatory changes.  There is no bowel obstruction or active inflammation. Vascular/Lymphatic: Advanced aortoiliac atherosclerotic disease. Diffuse aneurysmal dilatation of the abdominal aorta measuring up to 4 cm in diameter similar to prior CT. The IVC is unremarkable. No portal venous gas. There is no adenopathy. Reproductive: Hysterectomy. A 7.4 cm left adnexal cyst similar to prior CT. Further evaluation with MRI on a nonemergent basis recommended. Other: Midline vertical anterior pelvic wall incisional scar. Musculoskeletal: Degenerative changes of the spine. No acute osseous pathology. No suspicious bone lesions. IMPRESSION: 1. Interval development of a 12 mm irregular nodule in the superior segment of the left lower lobe concerning for malignancy/metastasis. Additional smaller nodules seen on the prior CT are not visualized on today's exam. 2. Overall interval decrease in the size of the mediastinal lymph node compared to the prior CT. 3. No acute intra-abdominal or pelvic pathology. No evidence of metastatic disease in the abdomen and pelvis. 4. Sigmoid diverticulosis. No bowel obstruction. 5. A 7.4 cm left adnexal cyst similar to prior CT. Further evaluation with MRI on a nonemergent basis recommended. 6. Aortic Atherosclerosis (ICD10-I70.0) and Emphysema (ICD10-J43.9). Electronically Signed   By: AAnner CreteM.D.   On: 10/17/2019 16:20  CT ABDOMEN PELVIS W CONTRAST  Result Date: 10/17/2019 CLINICAL DATA:  74 year old female with history of non-small cell lung cancer presenting with worsening shortness of breath and cough. Undergoing chemotherapy. EXAM: CT CHEST, ABDOMEN, AND PELVIS WITH CONTRAST TECHNIQUE: Multidetector CT imaging of the chest, abdomen and pelvis was performed following the standard protocol during bolus administration of intravenous contrast. CONTRAST:  167m OMNIPAQUE IOHEXOL 300 MG/ML  SOLN COMPARISON:  CT of the chest abdomen pelvis dated 07/27/2019 FINDINGS: CT CHEST FINDINGS Cardiovascular:  There is no cardiomegaly or pericardial effusion. Three-vessel coronary vascular calcification. There is moderate atherosclerotic calcification of the thoracic aorta. 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 The central pulmonary arteries appear patent. Right-sided Port-A-Cath with tip at the cavoatrial junction. Mediastinum/Nodes: No hilar or mediastinal adenopathy. Overall interval decrease in the size of the mediastinal lymph node compared to the prior CT of 08/16/2019. The esophagus is grossly unremarkable. No mediastinal fluid collection. Lungs/Pleura: Background of emphysema. There is a 12 mm nodule with irregular margins in the superior segment of the left lower lobe (68/6) which appears new since the prior CT. Several small nodular densities seen in the left lower lobe on the prior CT are not visualized on the current exam. A 2 mm subpleural nodule in the left upper lobe (57/6) indeterminate. There are bibasilar atelectasis/scarring. There is mild eventration of the left hemidiaphragm with left lung base atelectasis. There is no lobar consolidation, pleural effusion, or pneumothorax. The central airways are patent. Musculoskeletal: Degenerative changes of the spine. No acute osseous pathology. CT ABDOMEN PELVIS FINDINGS No intra-abdominal free air or free fluid. Hepatobiliary: Several small hepatic hypodense lesions similar to prior CT. The larger lesion in the right lobe of the liver demonstrates fluid attenuation consistent with a cyst. Additional smaller lesions are too small to characterize. There is mild intrahepatic biliary ductal dilatation, likely post cholecystectomy. Pancreas: Unremarkable. No pancreatic ductal dilatation or surrounding inflammatory changes. Spleen: Normal in size without focal abnormality. Adrenals/Urinary Tract: The adrenal glands unremarkable. Mild bilateral renal parenchyma  atrophy. There is no hydronephrosis on either side. There is symmetric enhancement and excretion of contrast by both kidneys. The visualized ureters and urinary bladder appear unremarkable. Stomach/Bowel: There is sigmoid diverticulosis without active inflammatory changes. There is no bowel obstruction or active inflammation. Vascular/Lymphatic: Advanced aortoiliac atherosclerotic disease. Diffuse aneurysmal dilatation of the abdominal aorta measuring up to 4 cm in diameter similar to prior CT. The IVC is unremarkable. No portal venous gas. There is no adenopathy. Reproductive: Hysterectomy. A 7.4 cm left adnexal cyst similar to prior CT. Further evaluation with MRI on a nonemergent basis recommended. Other: Midline vertical anterior pelvic wall incisional scar. Musculoskeletal: Degenerative changes of the spine. No acute osseous pathology. No suspicious bone lesions. IMPRESSION: 1. Interval development of a 12 mm irregular nodule in the superior segment of the left lower lobe concerning for malignancy/metastasis. Additional smaller nodules seen on the prior CT are not visualized on today's exam. 2. Overall interval decrease in the size of the mediastinal lymph node compared to the prior CT. 3. No acute intra-abdominal or pelvic pathology. No evidence of metastatic disease in the abdomen and pelvis. 4. Sigmoid diverticulosis. No bowel obstruction. 5. A 7.4 cm left adnexal cyst similar to prior CT. Further evaluation with MRI on a nonemergent basis recommended. 6. Aortic Atherosclerosis (ICD10-I70.0) and Emphysema (ICD10-J43.9). Electronically Signed   By: AAnner CreteM.D.   On:  10/17/2019 16:20   ECHOCARDIOGRAM COMPLETE  Result Date: 10/13/2019    ECHOCARDIOGRAM REPORT   Patient Name:   Debra Rodgers Date of Exam: 10/13/2019 Medical Rec #:  [id number]     Height:       61.5 in Accession #:    [id number]    Weight:       135.1 lb Date of Birth:  July 10, 1945     BSA:          1.609 m Patient Age:    76 years       BP:           152/91 mmHg Patient Gender: F             HR:           86 bpm. Exam Location:  Inpatient Procedure: 2D Echo, Cardiac Doppler and Color Doppler Indications:    I50.23 Acute on chronic systolic (congestive) heart failure  History:        Patient has no prior history of Echocardiogram examinations.                 COPD; Risk Factors:Former Smoker, Hypertension and Sleep Apnea.  Sonographer:    Tiffany Dance Referring Phys: [place]  1. Left ventricular ejection fraction, by estimation, is 65 to 70%. The left ventricle has normal function. The left ventricle has no regional wall motion abnormalities. There is moderate focal basal septal left ventricular hypertrophy. Left ventricular  diastolic parameters are consistent with Grade I diastolic dysfunction (impaired relaxation). There was turbulence across the LV outflow tract but no significant LVOT gradient was measured.  2. Right ventricular systolic function is normal. The right ventricular size is normal. There is normal pulmonary artery systolic pressure. The estimated right ventricular systolic pressure is 09.3 mmHg.  3. The mitral valve is normal in structure. No evidence of mitral valve regurgitation. No evidence of mitral stenosis.  4. The aortic valve is tricuspid. Aortic valve regurgitation is not visualized. No aortic stenosis is present.  5. Aortic dilatation noted. There is mild dilatation of the ascending aorta measuring 37 mm.  6. The inferior vena cava is normal in size with greater than 50% respiratory variability, suggesting right atrial pressure of 3 mmHg. FINDINGS  Left Ventricle: Left ventricular ejection fraction, by estimation, is 65 to 70%. The left ventricle has normal function. The left ventricle has no regional wall motion abnormalities. The left ventricular internal cavity size was normal in size. There is  moderate focal basal septal left ventricular hypertrophy. Left ventricular diastolic  parameters are consistent with Grade I diastolic dysfunction (impaired relaxation). Right Ventricle: The right ventricular size is normal. No increase in right ventricular wall thickness. Right ventricular systolic function is normal. There is normal pulmonary artery systolic pressure. The tricuspid regurgitant velocity is 2.39 m/s, and  with an assumed right atrial pressure of 3 mmHg, the estimated right ventricular systolic pressure is 23.5 mmHg. Left Atrium: Left atrial size was normal in size. Right Atrium: Right atrial size was normal in size. Pericardium: There is no evidence of pericardial effusion. Mitral Valve: The mitral valve is normal in structure. No evidence of mitral valve regurgitation. No evidence of mitral valve stenosis. Tricuspid Valve: The tricuspid valve is normal in structure. Tricuspid valve regurgitation is trivial. Aortic Valve: The aortic valve is tricuspid. Aortic valve regurgitation is not visualized. No aortic stenosis is present. Pulmonic Valve: The pulmonic valve was normal in structure. Pulmonic valve regurgitation is not  visualized. Aorta: The aortic root is normal in size and structure and aortic dilatation noted. There is mild dilatation of the ascending aorta measuring 37 mm. Venous: The inferior vena cava is normal in size with greater than 50% respiratory variability, suggesting right atrial pressure of 3 mmHg. IAS/Shunts: No atrial level shunt detected by color flow Doppler.  LEFT VENTRICLE PLAX 2D LVIDd:         3.70 cm  Diastology LVIDs:         2.50 cm  LV e' lateral:   7.29 cm/s LV PW:         1.00 cm  LV E/e' lateral: 9.7 LV IVS:        1.40 cm  LV e' medial:    5.66 cm/s LVOT diam:     1.80 cm  LV E/e' medial:  12.4 LV SV:         86 LV SV Index:   53 LVOT Area:     2.54 cm  RIGHT VENTRICLE             IVC RV Basal diam:  2.40 cm     IVC diam: 1.90 cm RV S prime:     20.30 cm/s TAPSE (M-mode): 1.9 cm LEFT ATRIUM             Index       RIGHT ATRIUM          Index LA  diam:        2.70 cm 1.68 cm/m  RA Area:     9.46 cm LA Vol (A2C):   61.4 ml 38.17 ml/m RA Volume:   16.90 ml 10.51 ml/m LA Vol (A4C):   43.5 ml 27.04 ml/m LA Biplane Vol: 51.5 ml 32.01 ml/m  AORTIC VALVE LVOT Vmax:   179.00 cm/s LVOT Vmean:  113.000 cm/s LVOT VTI:    0.338 m  AORTA Ao Root diam: 3.40 cm Ao Asc diam:  3.70 cm MITRAL VALVE               TRICUSPID VALVE MV Area (PHT): 2.32 cm    TR Peak grad:   22.8 mmHg MV Decel Time: 327 msec    TR Vmax:        239.00 cm/s MV E velocity: 70.40 cm/s MV A velocity: 84.60 cm/s  SHUNTS MV E/A ratio:  0.83        Systemic VTI:  0.34 m                            Systemic Diam: 1.80 cm Loralie Champagne MD Electronically signed by Loralie Champagne MD Signature Date/Time: 10/13/2019/5:05:21 PM    Final      ASSESSMENT/PLAN:  This is a very pleasant 74 year old African-American female diagnosed with stage IV non-small cell lung cancer, squamous cell carcinoma. She presented with a right upper lobe lung mass and pleural based metastases and mediastinal lymphadenopathy.She was diagnosed in August 2020   The patient is currently undergoing systemicchemotherapy with carboplatin for an AUC of 5, paclitaxel 175 mg/m, and Keytruda 200 mg IV every 3 weeks with Neulasta support.Starting from cycle #5, she is on maintenance with single agent Keytruda.She is status post15cyclesof treatment and she tolerated it wellwithout any concerning adverse effectsexcept for mild fatigue.  Labs were reviewed. I discussed with Dr. Julien Nordmann that the patient took 40 mg of prednisone today. He recommend that she continue with cycle #16 today as scheduled.  We will see her back for a follow up visit in 3 weeks for evaluation before starting cycle #17.  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 She states her medication is expensive and she is having a hard time affording it. Our financial resource specialist was contacted and she will give the patient an  application for assistance.   The patient's potassium was low at 3.0 today. She was given a prescription on 10/20/19 for potassium. I discussed this with the patient. She has not started taking this yet. I instructed her to take 20 mg BID for 7-10 days.   The patient was advised to call immediately if she has any concerning symptoms in the interval. The patient voices understanding of current disease status and treatment options and is in agreement with the current care plan. All questions were answered. The patient knows to call the clinic with any problems, questions or concerns. We can certainly see the patient much sooner if necessary        No orders of the defined types were placed in this encounter.    Debra Hecht L Tersa Fotopoulos, PA-C 10/27/19

## 2019-10-26 ENCOUNTER — Other Ambulatory Visit: Payer: Self-pay | Admitting: Medical Oncology

## 2019-10-26 DIAGNOSIS — C3491 Malignant neoplasm of unspecified part of right bronchus or lung: Secondary | ICD-10-CM

## 2019-10-27 ENCOUNTER — Inpatient Hospital Stay: Payer: Medicare HMO

## 2019-10-27 ENCOUNTER — Inpatient Hospital Stay: Payer: Medicare HMO | Attending: Physician Assistant | Admitting: Physician Assistant

## 2019-10-27 ENCOUNTER — Other Ambulatory Visit: Payer: Self-pay

## 2019-10-27 ENCOUNTER — Encounter: Payer: Self-pay | Admitting: Physician Assistant

## 2019-10-27 VITALS — BP 129/82 | HR 74 | Temp 97.5°F | Resp 18 | Ht 61.5 in | Wt 137.9 lb

## 2019-10-27 DIAGNOSIS — J449 Chronic obstructive pulmonary disease, unspecified: Secondary | ICD-10-CM | POA: Diagnosis not present

## 2019-10-27 DIAGNOSIS — C3411 Malignant neoplasm of upper lobe, right bronchus or lung: Secondary | ICD-10-CM | POA: Diagnosis not present

## 2019-10-27 DIAGNOSIS — Z79899 Other long term (current) drug therapy: Secondary | ICD-10-CM | POA: Diagnosis not present

## 2019-10-27 DIAGNOSIS — Z7901 Long term (current) use of anticoagulants: Secondary | ICD-10-CM | POA: Insufficient documentation

## 2019-10-27 DIAGNOSIS — C782 Secondary malignant neoplasm of pleura: Secondary | ICD-10-CM | POA: Diagnosis not present

## 2019-10-27 DIAGNOSIS — R519 Headache, unspecified: Secondary | ICD-10-CM | POA: Diagnosis not present

## 2019-10-27 DIAGNOSIS — I10 Essential (primary) hypertension: Secondary | ICD-10-CM | POA: Insufficient documentation

## 2019-10-27 DIAGNOSIS — Z5112 Encounter for antineoplastic immunotherapy: Secondary | ICD-10-CM | POA: Diagnosis not present

## 2019-10-27 DIAGNOSIS — C3491 Malignant neoplasm of unspecified part of right bronchus or lung: Secondary | ICD-10-CM

## 2019-10-27 DIAGNOSIS — Z86718 Personal history of other venous thrombosis and embolism: Secondary | ICD-10-CM | POA: Insufficient documentation

## 2019-10-27 DIAGNOSIS — G4730 Sleep apnea, unspecified: Secondary | ICD-10-CM | POA: Insufficient documentation

## 2019-10-27 DIAGNOSIS — E876 Hypokalemia: Secondary | ICD-10-CM

## 2019-10-27 DIAGNOSIS — Z95828 Presence of other vascular implants and grafts: Secondary | ICD-10-CM

## 2019-10-27 DIAGNOSIS — F329 Major depressive disorder, single episode, unspecified: Secondary | ICD-10-CM | POA: Insufficient documentation

## 2019-10-27 DIAGNOSIS — F17210 Nicotine dependence, cigarettes, uncomplicated: Secondary | ICD-10-CM | POA: Insufficient documentation

## 2019-10-27 LAB — CBC WITH DIFFERENTIAL (CANCER CENTER ONLY)
Abs Immature Granulocytes: 0.06 10*3/uL (ref 0.00–0.07)
Basophils Absolute: 0 10*3/uL (ref 0.0–0.1)
Basophils Relative: 0 %
Eosinophils Absolute: 0 10*3/uL (ref 0.0–0.5)
Eosinophils Relative: 0 %
HCT: 34.8 % — ABNORMAL LOW (ref 36.0–46.0)
Hemoglobin: 12 g/dL (ref 12.0–15.0)
Immature Granulocytes: 1 %
Lymphocytes Relative: 11 %
Lymphs Abs: 1.2 10*3/uL (ref 0.7–4.0)
MCH: 32.6 pg (ref 26.0–34.0)
MCHC: 34.5 g/dL (ref 30.0–36.0)
MCV: 94.6 fL (ref 80.0–100.0)
Monocytes Absolute: 0.4 10*3/uL (ref 0.1–1.0)
Monocytes Relative: 4 %
Neutro Abs: 8.8 10*3/uL — ABNORMAL HIGH (ref 1.7–7.7)
Neutrophils Relative %: 84 %
Platelet Count: 239 10*3/uL (ref 150–400)
RBC: 3.68 MIL/uL — ABNORMAL LOW (ref 3.87–5.11)
RDW: 13.5 % (ref 11.5–15.5)
WBC Count: 10.4 10*3/uL (ref 4.0–10.5)
nRBC: 0 % (ref 0.0–0.2)

## 2019-10-27 LAB — CMP (CANCER CENTER ONLY)
ALT: 51 U/L — ABNORMAL HIGH (ref 0–44)
AST: 24 U/L (ref 15–41)
Albumin: 3.5 g/dL (ref 3.5–5.0)
Alkaline Phosphatase: 69 U/L (ref 38–126)
Anion gap: 7 (ref 5–15)
BUN: 29 mg/dL — ABNORMAL HIGH (ref 8–23)
CO2: 33 mmol/L — ABNORMAL HIGH (ref 22–32)
Calcium: 9.7 mg/dL (ref 8.9–10.3)
Chloride: 96 mmol/L — ABNORMAL LOW (ref 98–111)
Creatinine: 0.99 mg/dL (ref 0.44–1.00)
GFR, Est AFR Am: >60 mL/min (ref >60)
GFR, Estimated: 57 mL/min — ABNORMAL LOW (ref >60)
Glucose, Bld: 110 mg/dL — ABNORMAL HIGH (ref 70–99)
Potassium: 3 mmol/L — CL (ref 3.5–5.1)
Sodium: 136 mmol/L (ref 135–145)
Total Bilirubin: 1.2 mg/dL (ref 0.3–1.2)
Total Protein: 6.7 g/dL (ref 6.5–8.1)

## 2019-10-27 MED ORDER — SODIUM CHLORIDE 0.9 % IV SOLN
200.0000 mg | Freq: Once | INTRAVENOUS | Status: AC
  Administered 2019-10-27: 200 mg via INTRAVENOUS
  Filled 2019-10-27: qty 8

## 2019-10-27 MED ORDER — HEPARIN SOD (PORK) LOCK FLUSH 100 UNIT/ML IV SOLN
500.0000 [IU] | Freq: Once | INTRAVENOUS | Status: AC | PRN
  Administered 2019-10-27: 500 [IU]
  Filled 2019-10-27: qty 5

## 2019-10-27 MED ORDER — SODIUM CHLORIDE 0.9% FLUSH
10.0000 mL | INTRAVENOUS | Status: DC | PRN
  Administered 2019-10-27: 10 mL
  Filled 2019-10-27: qty 10

## 2019-10-27 MED ORDER — SODIUM CHLORIDE 0.9 % IV SOLN
Freq: Once | INTRAVENOUS | Status: AC
  Filled 2019-10-27: qty 250

## 2019-10-27 NOTE — Progress Notes (Signed)
Received referral from RN and NP for possible assistance with Xarelto.  Printed application for [employer] and gave to NP to complete her portion and have patient complete her portion(w/supporting documents) and return both to [employer].

## 2019-10-27 NOTE — Progress Notes (Signed)
Ok to give [place] while pt finishing up prednisone per Dr Julien Nordmann

## 2019-10-27 NOTE — Patient Instructions (Signed)
[facility] Discharge Instructions for Patients Receiving Chemotherapy  Today you received the following chemotherapy agents:  Keytruda.  To help prevent nausea and vomiting after your treatment, we encourage you to take your nausea medication as directed.   If you develop nausea and vomiting that is not controlled by your nausea medication, call the clinic.   BELOW ARE SYMPTOMS THAT SHOULD BE REPORTED IMMEDIATELY:  *FEVER GREATER THAN 100.5 F  *CHILLS WITH OR WITHOUT FEVER  NAUSEA AND VOMITING THAT IS NOT CONTROLLED WITH YOUR NAUSEA MEDICATION  *UNUSUAL SHORTNESS OF BREATH  *UNUSUAL BRUISING OR BLEEDING  TENDERNESS IN MOUTH AND THROAT WITH OR WITHOUT PRESENCE OF ULCERS  *URINARY PROBLEMS  *BOWEL PROBLEMS  UNUSUAL RASH Items with * indicate a potential emergency and should be followed up as soon as possible.  Feel free to call the clinic should you have any questions or concerns. The clinic phone number is [phone].  Please show the CHEMO ALERT CARD at check-in to the Emergency Department and triage nurse.    

## 2019-11-02 ENCOUNTER — Encounter: Payer: Self-pay | Admitting: Internal Medicine

## 2019-11-02 NOTE — Progress Notes (Signed)
Faxed the Patient Assistance Application to [employer] for [place].  Will notify the pt with the outcome once received.

## 2019-11-05 ENCOUNTER — Telehealth: Payer: Self-pay | Admitting: *Deleted

## 2019-11-05 NOTE — Telephone Encounter (Signed)
Patient called to advise that she has been without her Xarelto for over a week.  She returned documentation for financial assistance for Xarelto help.  She also concerned about sore in roof of mouth/ sore throat/white coating of tongue.  Reports waking wet a sweat and having cold chills in the morning.  Denies having a fever.  Denies to being around any COVID patients.    Pharmacy [facility] on file.  Routed to MD to advise

## 2019-11-06 ENCOUNTER — Other Ambulatory Visit: Payer: Self-pay | Admitting: Internal Medicine

## 2019-11-06 MED ORDER — RIVAROXABAN 20 MG PO TABS: 20.0000 mg | ORAL_TABLET | Freq: Every day | ORAL | 2 refills | Status: DC

## 2019-11-10 ENCOUNTER — Other Ambulatory Visit: Payer: Medicare HMO

## 2019-11-10 ENCOUNTER — Ambulatory Visit: Payer: Medicare HMO | Admitting: Internal Medicine

## 2019-11-10 ENCOUNTER — Ambulatory Visit: Payer: Medicare HMO

## 2019-11-17 ENCOUNTER — Other Ambulatory Visit: Payer: Self-pay | Admitting: Physician Assistant

## 2019-11-17 ENCOUNTER — Inpatient Hospital Stay (HOSPITAL_BASED_OUTPATIENT_CLINIC_OR_DEPARTMENT_OTHER): Payer: Medicare HMO | Admitting: Internal Medicine

## 2019-11-17 ENCOUNTER — Inpatient Hospital Stay: Payer: Medicare HMO

## 2019-11-17 ENCOUNTER — Other Ambulatory Visit: Payer: Self-pay

## 2019-11-17 ENCOUNTER — Encounter: Payer: Self-pay | Admitting: Internal Medicine

## 2019-11-17 VITALS — BP 114/70 | HR 76 | Temp 97.9°F | Resp 18 | Ht 61.5 in | Wt 137.3 lb

## 2019-11-17 DIAGNOSIS — Z5112 Encounter for antineoplastic immunotherapy: Secondary | ICD-10-CM

## 2019-11-17 DIAGNOSIS — E876 Hypokalemia: Secondary | ICD-10-CM

## 2019-11-17 DIAGNOSIS — C3491 Malignant neoplasm of unspecified part of right bronchus or lung: Secondary | ICD-10-CM

## 2019-11-17 DIAGNOSIS — Z95828 Presence of other vascular implants and grafts: Secondary | ICD-10-CM

## 2019-11-17 DIAGNOSIS — I10 Essential (primary) hypertension: Secondary | ICD-10-CM

## 2019-11-17 DIAGNOSIS — R5383 Other fatigue: Secondary | ICD-10-CM

## 2019-11-17 LAB — CMP (CANCER CENTER ONLY)
ALT: 8 U/L (ref 0–44)
AST: 13 U/L — ABNORMAL LOW (ref 15–41)
Albumin: 3.4 g/dL — ABNORMAL LOW (ref 3.5–5.0)
Alkaline Phosphatase: 71 U/L (ref 38–126)
Anion gap: 7 (ref 5–15)
BUN: 17 mg/dL (ref 8–23)
CO2: 33 mmol/L — ABNORMAL HIGH (ref 22–32)
Calcium: 10.1 mg/dL (ref 8.9–10.3)
Chloride: 98 mmol/L (ref 98–111)
Creatinine: 1.01 mg/dL — ABNORMAL HIGH (ref 0.44–1.00)
GFR, Est AFR Am: >60 mL/min (ref >60)
GFR, Estimated: 56 mL/min — ABNORMAL LOW (ref >60)
Glucose, Bld: 134 mg/dL — ABNORMAL HIGH (ref 70–99)
Potassium: 2.5 mmol/L — CL (ref 3.5–5.1)
Sodium: 138 mmol/L (ref 135–145)
Total Bilirubin: 0.8 mg/dL (ref 0.3–1.2)
Total Protein: 7.1 g/dL (ref 6.5–8.1)

## 2019-11-17 LAB — CBC WITH DIFFERENTIAL (CANCER CENTER ONLY)
Abs Immature Granulocytes: 0.01 10*3/uL (ref 0.00–0.07)
Basophils Absolute: 0 10*3/uL (ref 0.0–0.1)
Basophils Relative: 1 %
Eosinophils Absolute: 0.1 10*3/uL (ref 0.0–0.5)
Eosinophils Relative: 2 %
HCT: 33.4 % — ABNORMAL LOW (ref 36.0–46.0)
Hemoglobin: 11.2 g/dL — ABNORMAL LOW (ref 12.0–15.0)
Immature Granulocytes: 0 %
Lymphocytes Relative: 36 %
Lymphs Abs: 1.4 10*3/uL (ref 0.7–4.0)
MCH: 32.3 pg (ref 26.0–34.0)
MCHC: 33.5 g/dL (ref 30.0–36.0)
MCV: 96.3 fL (ref 80.0–100.0)
Monocytes Absolute: 0.4 10*3/uL (ref 0.1–1.0)
Monocytes Relative: 11 %
Neutro Abs: 1.9 10*3/uL (ref 1.7–7.7)
Neutrophils Relative %: 50 %
Platelet Count: 329 10*3/uL (ref 150–400)
RBC: 3.47 MIL/uL — ABNORMAL LOW (ref 3.87–5.11)
RDW: 13.2 % (ref 11.5–15.5)
WBC Count: 3.9 10*3/uL — ABNORMAL LOW (ref 4.0–10.5)
nRBC: 0 % (ref 0.0–0.2)

## 2019-11-17 LAB — TSH: TSH: 0.913 u[IU]/mL (ref 0.308–3.960)

## 2019-11-17 MED ORDER — SODIUM CHLORIDE 0.9 % IV SOLN
Freq: Once | INTRAVENOUS | Status: AC
  Filled 2019-11-17: qty 1000

## 2019-11-17 MED ORDER — SODIUM CHLORIDE 0.9% FLUSH
10.0000 mL | INTRAVENOUS | Status: DC | PRN
  Administered 2019-11-17: 10 mL
  Filled 2019-11-17: qty 10

## 2019-11-17 MED ORDER — HEPARIN SOD (PORK) LOCK FLUSH 100 UNIT/ML IV SOLN
500.0000 [IU] | Freq: Once | INTRAVENOUS | Status: AC | PRN
  Administered 2019-11-17: 500 [IU]
  Filled 2019-11-17: qty 5

## 2019-11-17 MED ORDER — SODIUM CHLORIDE 0.9 % IV SOLN
Freq: Once | INTRAVENOUS | Status: AC
  Filled 2019-11-17: qty 250

## 2019-11-17 MED ORDER — POTASSIUM CHLORIDE CRYS ER 20 MEQ PO TBCR: 20.0000 meq | EXTENDED_RELEASE_TABLET | Freq: Two times a day (BID) | ORAL | 0 refills | Status: DC

## 2019-11-17 MED ORDER — SODIUM CHLORIDE 0.9 % IV SOLN
200.0000 mg | Freq: Once | INTRAVENOUS | Status: AC
  Administered 2019-11-17: 200 mg via INTRAVENOUS
  Filled 2019-11-17: qty 8

## 2019-11-17 NOTE — Progress Notes (Signed)
Debra Rodgers [phone]   [phone]  OFFICE PROGRESS NOTE  [name], MD [place] [address]  DIAGNOSIS: Stage IV non-small cell lung cancer, squamous cell carcinoma. She presented withright upper lobe lung mass in addition to pleural-based metastasis andmediastinal lymphadenopathy.She was diagnosed in September 2020.  PRIOR THERAPY: None  CURRENT THERAPY: Chemotherapy with carboplatin for an AUC of 5,paclitaxel 175 mg/m, and Keytruda 200 mg IV every 3 weeks with Neulasta support. Firsttreatmenton 12/09/2018.Status post16cycles.  Starting from cycle #5 she is currently on maintenance treatment with single agent Keytruda every 3 weeks.  INTERVAL HISTORY: Debra Rodgers 74 y.o. female returns to the clinic today for follow-up visit.  The patient is feeling fine today with no concerning complaints except for occasional headache.  She denied having any chest pain, shortness of breath, cough or hemoptysis.  She denied having any nausea, vomiting, diarrhea or constipation.  She has no significant weight loss or night sweats.  She has no fever or chills.  She continues to tolerate her treatment with Keytruda fairly well.  She is here today for evaluation before starting cycle #17.  MEDICAL HISTORY: Past Medical History:  Diagnosis Date   COPD (chronic obstructive pulmonary disease) ([place])    Depression    Essential hypertension    Headache    History of migraine headaches    lung ca dx'd 10/2018   Sleep apnea    Tobacco use disorder     ALLERGIES:  is allergic to codeine sulfate and pantoprazole sodium.  MEDICATIONS:  Current Outpatient Medications  Medication Sig Dispense Refill   albuterol (VENTOLIN HFA) 108 (90 Base) MCG/ACT inhaler Inhale 2 puffs into the lungs every 6 (six) hours as needed for wheezing or shortness of breath. 36 g 2   amLODipine (NORVASC) 10 MG tablet Take 1 tablet (10  mg total) by mouth daily. 90 tablet 3   atorvastatin (LIPITOR) 20 MG tablet Take 1 tablet (20 mg total) by mouth daily. 90 tablet 3   benzonatate (TESSALON) 100 MG capsule Take 1 capsule (100 mg total) by mouth every 8 (eight) hours as needed for cough. 20 capsule 0   CALCIUM PO Take 1 tablet by mouth daily.     chlorthalidone (HYGROTON) 50 MG tablet Take 1 tablet (50 mg total) by mouth daily. 90 tablet 1   famotidine (PEPCID) 20 MG tablet Take 20 mg by mouth daily.      FLUoxetine (PROZAC) 20 MG capsule Take 1 capsule (20 mg total) by mouth daily. 30 capsule 5   fluticasone (FLONASE) 50 MCG/ACT nasal spray Place 1 spray into both nostrils daily. 11.1 mL 0   Fluticasone-Umeclidin-Vilant (TRELEGY ELLIPTA) 200-62.5-25 MCG/INH AEPB One inhalation once daily 60 each 2   guaiFENesin-dextromethorphan (ROBITUSSIN DM) 100-10 MG/5ML syrup Take 5 mLs by mouth every 4 (four) hours as needed for cough. 118 mL 0   HYDROcodone-acetaminophen (NORCO) 7.5-325 MG tablet Take 1 tablet by mouth every 6 (six) hours as needed for moderate pain. 60 tablet 0   lidocaine-prilocaine (EMLA) cream Apply 1 application topically as needed. (Patient taking differently: Apply 1 application topically as needed (port access). ) 30 g 0   polyvinyl alcohol (LIQUIFILM TEARS) 1.4 % ophthalmic solution Place 1 drop into both eyes as needed for dry eyes.     potassium chloride SA (KLOR-CON) 20 MEQ tablet Take 2 tablets (40 mEq total) by mouth 2 (two) times daily. 20 tablet 0  rivaroxaban (XARELTO) 20 MG TABS tablet Take 1 tablet (20 mg total) by mouth daily with supper. 30 tablet 2   No current facility-administered medications for this visit.    SURGICAL HISTORY:  Past Surgical History:  Procedure Laterality Date   ABDOMINAL HYSTERECTOMY     CHOLECYSTECTOMY     IR IMAGING GUIDED PORT INSERTION  02/24/2019   ROTATOR CUFF REPAIR  3/04   VIDEO BRONCHOSCOPY WITH ENDOBRONCHIAL ULTRASOUND Right 11/25/2018    Procedure: VIDEO BRONCHOSCOPY WITH ENDOBRONCHIAL ULTRASOUND;  Surgeon: Collene Gobble, MD;  Location: [facility] OR;  Service: Thoracic;  Laterality: Right;    REVIEW OF SYSTEMS:  A comprehensive review of systems was negative except for: Constitutional: positive for fatigue   PHYSICAL EXAMINATION: General appearance: alert, cooperative and no distress Head: Normocephalic, without obvious abnormality, atraumatic Neck: no adenopathy, no JVD, supple, symmetrical, trachea midline and thyroid not enlarged, symmetric, no tenderness/mass/nodules Lymph nodes: Cervical, supraclavicular, and axillary nodes normal. Resp: clear to auscultation bilaterally Back: symmetric, no curvature. ROM normal. No CVA tenderness. Cardio: regular rate and rhythm, S1, S2 normal, no murmur, click, rub or gallop GI: soft, non-tender; bowel sounds normal; no masses,  no organomegaly Extremities: extremities normal, atraumatic, no cyanosis or edema  ECOG PERFORMANCE STATUS: 1 - Symptomatic but completely ambulatory  Blood pressure 114/70, pulse 76, temperature 97.9 F (36.6 C), temperature source Tympanic, resp. rate 18, height 5' 1.5" (1.562 m), weight 137 lb 4.8 oz (62.3 kg), SpO2 100 %.  LABORATORY DATA: Lab Results  Component Value Date   WBC 10.4 10/27/2019   HGB 12.0 10/27/2019   HCT 34.8 (L) 10/27/2019   MCV 94.6 10/27/2019   PLT 239 10/27/2019      Chemistry      Component Value Date/Time   NA 136 10/27/2019 1400   NA 139 07/21/2017 0958   K 3.0 (LL) 10/27/2019 1400   CL 96 (L) 10/27/2019 1400   CO2 33 (H) 10/27/2019 1400   BUN 29 (H) 10/27/2019 1400   BUN 26 07/21/2017 0958   CREATININE 0.99 10/27/2019 1400   CREATININE 0.79 09/03/2013 1540      Component Value Date/Time   CALCIUM 9.7 10/27/2019 1400   ALKPHOS 69 10/27/2019 1400   AST 24 10/27/2019 1400   ALT 51 (H) 10/27/2019 1400   BILITOT 1.2 10/27/2019 1400       RADIOGRAPHIC STUDIES: No results found.  ASSESSMENT AND PLAN: This is a  very pleasant 74 years old African-American female with stage IV non-small cell carcinoma,, squamous cell carcinoma diagnosed in September 2020.  She presented with extensive right-sided pleural and thoracic nodal hypermetabolic disease with no extrathoracic disease. The patient started induction treatment with systemic chemotherapy with carboplatin, paclitaxel and Keytruda status post 4 cycles with partial response after cycle #4.  She is currently undergoing maintenance treatment with single agent Keytruda status post 12 cycles. The patient has been tolerating this treatment well with no concerning adverse effects. I recommended for her to proceed with cycle #13 of the maintenance treatment today as planned. For the history of deep venous thrombosis, I will give her a refill of Xarelto. She will come back for follow-up visit in 3 weeks for evaluation before the next cycle of her treatment. The patient was advised to call immediately if she has any concerning symptoms in the interval. The patient voices understanding of current disease status and treatment options and is in agreement with the current care plan. All questions were answered. The patient knows to call  the clinic with any problems, questions or concerns. We can certainly see the patient much sooner if necessary.  Disclaimer: This note was dictated with voice recognition software. Similar sounding words can inadvertently be transcribed and may not be corrected upon review.

## 2019-11-17 NOTE — Patient Instructions (Addendum)
[facility] Discharge Instructions for Patients Receiving Chemotherapy  Today you received the following chemotherapy agents: Keytruda.  To help prevent nausea and vomiting after your treatment, we encourage you to take your nausea medication as directed.   If you develop nausea and vomiting that is not controlled by your nausea medication, call the clinic.   BELOW ARE SYMPTOMS THAT SHOULD BE REPORTED IMMEDIATELY:  *FEVER GREATER THAN 100.5 F  *CHILLS WITH OR WITHOUT FEVER  NAUSEA AND VOMITING THAT IS NOT CONTROLLED WITH YOUR NAUSEA MEDICATION  *UNUSUAL SHORTNESS OF BREATH  *UNUSUAL BRUISING OR BLEEDING  TENDERNESS IN MOUTH AND THROAT WITH OR WITHOUT PRESENCE OF ULCERS  *URINARY PROBLEMS  *BOWEL PROBLEMS  UNUSUAL RASH Items with * indicate a potential emergency and should be followed up as soon as possible.  Feel free to call the clinic should you have any questions or concerns. The clinic phone number is [phone].  Please show the [place] at check-in to the Emergency Department and triage nurse.   Hypokalemia Hypokalemia means that the amount of potassium in the blood is lower than normal. Potassium is a chemical (electrolyte) that helps regulate the amount of fluid in the body. It also stimulates muscle tightening (contraction) and helps nerves work properly. Normally, most of the body's potassium is inside cells, and only a very small amount is in the blood. Because the amount in the blood is so small, minor changes to potassium levels in the blood can be life-threatening. What are the causes? This condition may be caused by:  Antibiotic medicine.  Diarrhea or vomiting. Taking too much of a medicine that helps you have a bowel movement (laxative) can cause diarrhea and lead to hypokalemia.  Chronic kidney disease (CKD).  Medicines that help the body get rid of excess fluid (diuretics).  Eating disorders, such as bulimia.  Low  magnesium levels in the body.  Sweating a lot. What are the signs or symptoms? Symptoms of this condition include:  Weakness.  Constipation.  Fatigue.  Muscle cramps.  Mental confusion.  Skipped heartbeats or irregular heartbeat (palpitations).  Tingling or numbness. How is this diagnosed? This condition is diagnosed with a blood test. How is this treated? This condition may be treated by:  Taking potassium supplements by mouth.  Adjusting the medicines that you take.  Eating more foods that contain a lot of potassium. If your potassium level is very low, you may need to get potassium through an IV and be monitored in the hospital. Follow these instructions at home:   Take over-the-counter and prescription medicines only as told by your health care provider. This includes vitamins and supplements.  Eat a healthy diet. A healthy diet includes fresh fruits and vegetables, whole grains, healthy fats, and lean proteins.  If instructed, eat more foods that contain a lot of potassium. This includes: ? Nuts, such as peanuts and pistachios. ? Seeds, such as sunflower seeds and pumpkin seeds. ? Peas, lentils, and lima beans. ? Whole grain and bran cereals and breads. ? Fresh fruits and vegetables, such as apricots, avocado, bananas, cantaloupe, kiwi, oranges, tomatoes, asparagus, and potatoes. ? Orange juice. ? Tomato juice. ? Red meats. ? Yogurt.  Keep all follow-up visits as told by your health care provider. This is important. Contact a health care provider if you:  Have weakness that gets worse.  Feel your heart pounding or racing.  Vomit.  Have diarrhea.  Have diabetes (diabetes mellitus) and you have trouble keeping your  blood sugar (glucose) in your target range. Get help right away if you:  Have chest pain.  Have shortness of breath.  Have vomiting or diarrhea that lasts for more than 2 days.  Faint. Summary  Hypokalemia means that the amount of  potassium in the blood is lower than normal.  This condition is diagnosed with a blood test.  Hypokalemia may be treated by taking potassium supplements, adjusting the medicines that you take, or eating more foods that are high in potassium.  If your potassium level is very low, you may need to get potassium through an IV and be monitored in the hospital. This information is not intended to replace advice given to you by your health care provider. Make sure you discuss any questions you have with your health care provider. Document Revised: 10/22/2017 Document Reviewed: 10/22/2017 Elsevier Patient Education  [place].

## 2019-11-18 ENCOUNTER — Other Ambulatory Visit: Payer: Self-pay | Admitting: Student in an Organized Health Care Education/Training Program

## 2019-11-18 DIAGNOSIS — Z1231 Encounter for screening mammogram for malignant neoplasm of breast: Secondary | ICD-10-CM

## 2019-11-27 ENCOUNTER — Other Ambulatory Visit: Payer: Self-pay | Admitting: Student in an Organized Health Care Education/Training Program

## 2019-11-30 NOTE — Telephone Encounter (Signed)
Lisinopril was discontinued at her hospital stay in June.  Will not refill and have her address with Dr. Evette Doffing at appointment.

## 2019-12-06 ENCOUNTER — Other Ambulatory Visit: Payer: Self-pay | Admitting: Student in an Organized Health Care Education/Training Program

## 2019-12-06 NOTE — Progress Notes (Signed)
[place] OFFICE PROGRESS NOTE  [name], MD [address] [place] [address]  DIAGNOSIS: Stage IV non-small cell lung cancer, squamous cell carcinoma. She presented withright upper lobe lung mass in addition to pleural-based metastasis andmediastinal lymphadenopathy.She was diagnosed in September 2020.  PRIOR THERAPY: None  CURRENT THERAPY: Chemotherapy with carboplatin for an AUC of 5,paclitaxel 175 mg/m, and Keytruda 200 mg IV every 3 weeks with Neulasta support. Firsttreatmenton 12/09/2018.Status post17cycles. Starting from cycle #5 she is currently on maintenance treatment with single agent Keytruda every 3 weeks.  INTERVAL HISTORY: Debra Rodgers 74 y.o. female returns to the clinic for a follow up visit. The patient is feeling well today without any concerning complaints except she has a mild rash on her neck that itches. The area is slightly erythematous without any macules or papules. The patient is allergic to strawberries and ate new yogurt recently and wonders if the yogurt had strawberries in it. Denies associated respiratory symptoms.   The patient continues to tolerate treatment with single agent Keytrudawell without any adverse side effects. Denies any fever, chills, night sweats, or weight loss. Denies any chest pain or hemoptysis but reportsbaseline dyspnea. She is prescribed inhalers for her COPD/wheezing but denies excessive use of her inhalers. She reports her baseline cough for which she takes tessalon or robitussin. She is requesting a refill of tessalon.Denies any nausea, vomiting, diarrhea, or constipation. She has been having headaches for the last several weeks intermittently. She localizes the headache to the back of her neck that sometimes radiates to the front of her head. She characterizes her headache as throbbing. She has been taking tylenol. She denied history of migraines or headaches. She reports some visual  blurring and states she is overdue for an eye exam. Denies seizures. She denies changes with her balance but states that when she walks too fast she drifts to the right. The patient is here today for evaluation prior to starting cycle #18.  MEDICAL HISTORY: Past Medical History:  Diagnosis Date  . COPD (chronic obstructive pulmonary disease) ([place])   . Depression   . Essential hypertension   . Headache   . History of migraine headaches   . lung ca dx'd 10/2018  . Sleep apnea   . Tobacco use disorder     ALLERGIES:  is allergic to codeine sulfate and pantoprazole sodium.  MEDICATIONS:  Current Outpatient Medications  Medication Sig Dispense Refill  . albuterol (VENTOLIN HFA) 108 (90 Base) MCG/ACT inhaler Inhale 2 puffs into the lungs every 6 (six) hours as needed for wheezing or shortness of breath. 36 g 2  . amLODipine (NORVASC) 10 MG tablet Take 1 tablet (10 mg total) by mouth daily. 90 tablet 3  . atorvastatin (LIPITOR) 20 MG tablet Take 1 tablet (20 mg total) by mouth daily. 90 tablet 3  . benzonatate (TESSALON) 100 MG capsule Take 1 capsule (100 mg total) by mouth every 8 (eight) hours as needed for cough. 90 capsule 1  . CALCIUM PO Take 1 tablet by mouth daily.    . chlorthalidone (HYGROTON) 50 MG tablet TAKE 1 TABLET EVERY DAY 90 tablet 3  . famotidine (PEPCID) 20 MG tablet Take 20 mg by mouth daily.     Marland Kitchen FLUoxetine (PROZAC) 20 MG capsule TAKE 1 CAPSULE EVERY DAY 90 capsule 3  . Fluticasone-Umeclidin-Vilant (TRELEGY ELLIPTA) 200-62.5-25 MCG/INH AEPB One inhalation once daily 60 each 2  . guaiFENesin-dextromethorphan (ROBITUSSIN DM) 100-10 MG/5ML syrup Take 5 mLs by  mouth every 4 (four) hours as needed for cough. 118 mL 0  . HYDROcodone-acetaminophen (NORCO) 7.5-325 MG tablet Take 1 tablet by mouth every 6 (six) hours as needed for moderate pain. 60 tablet 0  . lidocaine-prilocaine (EMLA) cream Apply 1 application topically as needed. (Patient taking differently: Apply 1  application topically as needed (port access). ) 30 g 0  . polyvinyl alcohol (LIQUIFILM TEARS) 1.4 % ophthalmic solution Place 1 drop into both eyes as needed for dry eyes.    . potassium chloride SA (KLOR-CON) 20 MEQ tablet Take 1 tablet (20 mEq total) by mouth 2 (two) times daily. 20 tablet 0  . rivaroxaban (XARELTO) 20 MG TABS tablet Take 1 tablet (20 mg total) by mouth daily with supper. 30 tablet 2  . fluticasone (FLONASE) 50 MCG/ACT nasal spray Place 1 spray into both nostrils daily. 11.1 mL 0   No current facility-administered medications for this visit.    SURGICAL HISTORY:  Past Surgical History:  Procedure Laterality Date  . ABDOMINAL HYSTERECTOMY    . CHOLECYSTECTOMY    . IR IMAGING GUIDED PORT INSERTION  02/24/2019  . ROTATOR CUFF REPAIR  3/04  . VIDEO BRONCHOSCOPY WITH ENDOBRONCHIAL ULTRASOUND Right 11/25/2018   Procedure: VIDEO BRONCHOSCOPY WITH ENDOBRONCHIAL ULTRASOUND;  Surgeon: Collene Gobble, MD;  Location: [facility] OR;  Service: Thoracic;  Laterality: Right;    REVIEW OF SYSTEMS:   Constitutional: Negative for appetite change, chills, fatigue, fever and unexpected weight change.  HENT: Negative for mouth sores, nosebleeds, sore throat and trouble swallowing.  Eyes: Positive for bilateral blurry vision. Negative fordiplopia, erythema, oricterus.  Respiratory:Positive for baseline shortness of breath on exertion and cough. Negative for hemoptysis or wheezing. Cardiovascular:Negative for chest pain or leg swelling.  Gastrointestinal: Negative for abdominal pain, constipation, diarrhea, nausea and vomiting.  Genitourinary: Negative for bladder incontinence, difficulty urinating, dysuria, frequency and hematuria.  Musculoskeletal: Negative for back pain, gait problem, neck pain and neck stiffness.  Skin: Positive for itching and rash on neck bilaterally.  Neurological:Positive for occasional headaches.Negative for dizziness, extremity weakness, light-headedness and  seizures.  Hematological: Negative for adenopathy. Does not bruise/bleed easily.  Psychiatric/Behavioral: Negative for confusion, depression and sleep disturbance. The patient is not nervous/anxious.   PHYSICAL EXAMINATION:  Blood pressure 131/79, pulse 68, temperature 97.7 F (36.5 C), temperature source Tympanic, resp. rate 18, height 5' 1.5" (1.562 m), weight 134 lb 9.6 oz (61.1 kg), SpO2 96 %.  ECOG PERFORMANCE STATUS: 1 - Symptomatic but completely ambulatory  Physical Exam  Constitutional: Oriented to person, place, and time and well-developed, well-nourished, and in no distress.  HENT:  Head: Normocephalic and atraumatic.  Mouth/Throat: Oropharynx is clear and moist. No oropharyngeal exudate.  Eyes: Conjunctivae are normal. Right eye exhibits no discharge. Left eye exhibits no discharge. No scleral icterus.  Neck: Normal range of motion. Neck supple.  Cardiovascular: Normal rate, regular rhythm, normal heart sounds and intact distal pulses.   Pulmonary/Chest: Effort normal. Positive for bilateral wheezing. No respiratory distress.  No rales.  Abdominal: Soft. Bowel sounds are normal. Exhibits no distension and no mass. There is no tenderness.  Musculoskeletal: Normal range of motion. Exhibits no edema.  Lymphadenopathy:    No cervical adenopathy.  Neurological: Alert and oriented to person, place, and time. Exhibits normal muscle tone. Gait normal. Coordination normal.  Skin: Skin is warm and dry. Mild erythema on her neck bilaterally without warmth or palpable rash. Not diaphoretic.  No pallor.  Psychiatric: Mood, memory and judgment normal.  Vitals reviewed.  LABORATORY DATA: Lab Results  Component Value Date   WBC 4.8 12/08/2019   HGB 11.7 (L) 12/08/2019   HCT 34.5 (L) 12/08/2019   MCV 94.0 12/08/2019   PLT 182 12/08/2019      Chemistry      Component Value Date/Time   NA 139 12/08/2019 1012   NA 139 07/21/2017 0958   K 2.6 (LL) 12/08/2019 1012   CL 97 (L)  12/08/2019 1012   CO2 33 (H) 12/08/2019 1012   BUN 23 12/08/2019 1012   BUN 26 07/21/2017 0958   CREATININE 0.94 12/08/2019 1012   CREATININE 0.79 09/03/2013 1540      Component Value Date/Time   CALCIUM 9.9 12/08/2019 1012   ALKPHOS 71 12/08/2019 1012   AST 15 12/08/2019 1012   ALT 8 12/08/2019 1012   BILITOT 1.1 12/08/2019 1012       RADIOGRAPHIC STUDIES:  No results found.   ASSESSMENT/PLAN:  This is a very pleasant 74 year old African-American female diagnosed with stage IV non-small cell lung cancer, squamous cell carcinoma. She presented with a right upper lobe lung mass and pleural based metastases and mediastinal lymphadenopathy.She was diagnosed in August 2020   The patient is currently undergoing systemicchemotherapy with carboplatin for an AUC of 5, paclitaxel 175 mg/m, and Keytruda 200 mg IV every 3 weeks with Neulasta support.Starting from cycle #5, she is on maintenance with single agent Keytruda.She is status post17cyclesof treatment and she tolerated it wellwithout any concerning adverse effectsexcept for mild fatigue.  Labs were reviewed. We recommend that she continue with cycle #18 today as scheduled.   I will arrange for a restaging CT scan of the chest, abdomen, and pelvis prior to her next cycle of treatment. I discussed her headaches with Dr. Julien Nordmann. We will also order a brain MRI to rule out brain metastasis.   Her potassium is low again at 2.6 today. We will arrange for IV potassium today with hopefully 40 meq if time permits. I have also sent in a prescription for 20 meq BID for 10 days to her pharmacy. We will check her magnesium as well today and will supplement if it is low.  We will see her back for a follow up visit in 3 weeks for evaluation and to review her scanbefore starting cycle #19.  She was advised to use hydrocortisone cream for her rash. She was advised to call if new or worsening rash. Her rash appeared mild and did  not look like an immunotherapy mediated rash.   I have sent in a refill of her tessalon to her pharmacy for her cough.   The patient was advised to call immediately if she has any concerning symptoms in the interval. The patient voices understanding of current disease status and treatment options and is in agreement with the current care plan. All questions were answered. The patient knows to call the clinic with any problems, questions or concerns. We can certainly see the patient much sooner if necessary   Orders Placed This Encounter  Procedures  . CT Chest W Contrast    Standing Status:   Future    Standing Expiration Date:   12/07/2020    Order Specific Question:   If indicated for the ordered procedure, I authorize the administration of contrast media per Radiology protocol    Answer:   Yes    Order Specific Question:   Preferred imaging location?    Answer:   [facility]    Order Specific Question:  Radiology Contrast Protocol - do NOT remove file path    Answer:   [email or web address]  . CT Abdomen Pelvis W Contrast    Standing Status:   Future    Standing Expiration Date:   12/07/2020    Order Specific Question:   If indicated for the ordered procedure, I authorize the administration of contrast media per Radiology protocol    Answer:   Yes    Order Specific Question:   Preferred imaging location?    Answer:   [facility]    Order Specific Question:   Is Oral Contrast requested for this exam?    Answer:   Yes, Per Radiology protocol    Order Specific Question:   Radiology Contrast Protocol - do NOT remove file path    Answer:   [email or web address]  . MR Brain W Wo Contrast    Standing Status:   Future    Standing Expiration Date:   12/07/2020    Order Specific Question:   If indicated for the ordered procedure, I authorize the administration of contrast media per Radiology protocol     Answer:   Yes    Order Specific Question:   What is the patient's sedation requirement?    Answer:   No Sedation    Order Specific Question:   Does the patient have a pacemaker or implanted devices?    Answer:   No    Order Specific Question:   Use SRS Protocol?    Answer:   No    Order Specific Question:   Radiology Contrast Protocol - do NOT remove file path    Answer:   [email or web address]    Order Specific Question:   Preferred imaging location?    Answer:   [facility] (table limit - 550 lbs)  . Magnesium    Standing Status:   Future    Standing Expiration Date:   12/07/2020     Shanette Tamargo L Thereasa Iannello, PA-C 12/08/19

## 2019-12-08 ENCOUNTER — Encounter: Payer: Self-pay | Admitting: Physician Assistant

## 2019-12-08 ENCOUNTER — Other Ambulatory Visit: Payer: Self-pay | Admitting: Physician Assistant

## 2019-12-08 ENCOUNTER — Inpatient Hospital Stay: Payer: Medicare HMO

## 2019-12-08 ENCOUNTER — Inpatient Hospital Stay: Payer: Medicare HMO | Attending: Physician Assistant | Admitting: Physician Assistant

## 2019-12-08 ENCOUNTER — Encounter: Payer: Self-pay | Admitting: *Deleted

## 2019-12-08 ENCOUNTER — Other Ambulatory Visit: Payer: Self-pay

## 2019-12-08 VITALS — BP 121/70 | HR 67 | Temp 98.7°F | Resp 18

## 2019-12-08 VITALS — BP 131/79 | HR 68 | Temp 97.7°F | Resp 18 | Ht 61.5 in | Wt 134.6 lb

## 2019-12-08 DIAGNOSIS — R5383 Other fatigue: Secondary | ICD-10-CM

## 2019-12-08 DIAGNOSIS — Z5112 Encounter for antineoplastic immunotherapy: Secondary | ICD-10-CM | POA: Diagnosis not present

## 2019-12-08 DIAGNOSIS — F17210 Nicotine dependence, cigarettes, uncomplicated: Secondary | ICD-10-CM | POA: Insufficient documentation

## 2019-12-08 DIAGNOSIS — C3411 Malignant neoplasm of upper lobe, right bronchus or lung: Secondary | ICD-10-CM | POA: Insufficient documentation

## 2019-12-08 DIAGNOSIS — E876 Hypokalemia: Secondary | ICD-10-CM

## 2019-12-08 DIAGNOSIS — Z7901 Long term (current) use of anticoagulants: Secondary | ICD-10-CM | POA: Insufficient documentation

## 2019-12-08 DIAGNOSIS — C782 Secondary malignant neoplasm of pleura: Secondary | ICD-10-CM | POA: Diagnosis not present

## 2019-12-08 DIAGNOSIS — F329 Major depressive disorder, single episode, unspecified: Secondary | ICD-10-CM | POA: Diagnosis not present

## 2019-12-08 DIAGNOSIS — R519 Headache, unspecified: Secondary | ICD-10-CM | POA: Insufficient documentation

## 2019-12-08 DIAGNOSIS — I10 Essential (primary) hypertension: Secondary | ICD-10-CM | POA: Diagnosis not present

## 2019-12-08 DIAGNOSIS — C3491 Malignant neoplasm of unspecified part of right bronchus or lung: Secondary | ICD-10-CM

## 2019-12-08 DIAGNOSIS — Z95828 Presence of other vascular implants and grafts: Secondary | ICD-10-CM

## 2019-12-08 DIAGNOSIS — Z5111 Encounter for antineoplastic chemotherapy: Secondary | ICD-10-CM | POA: Insufficient documentation

## 2019-12-08 DIAGNOSIS — J449 Chronic obstructive pulmonary disease, unspecified: Secondary | ICD-10-CM | POA: Insufficient documentation

## 2019-12-08 DIAGNOSIS — R21 Rash and other nonspecific skin eruption: Secondary | ICD-10-CM | POA: Diagnosis not present

## 2019-12-08 DIAGNOSIS — G4730 Sleep apnea, unspecified: Secondary | ICD-10-CM | POA: Diagnosis not present

## 2019-12-08 DIAGNOSIS — R51 Headache with orthostatic component, not elsewhere classified: Secondary | ICD-10-CM | POA: Diagnosis not present

## 2019-12-08 DIAGNOSIS — Z79899 Other long term (current) drug therapy: Secondary | ICD-10-CM | POA: Insufficient documentation

## 2019-12-08 LAB — CBC WITH DIFFERENTIAL (CANCER CENTER ONLY)
Abs Immature Granulocytes: 0.01 10*3/uL (ref 0.00–0.07)
Basophils Absolute: 0.1 10*3/uL (ref 0.0–0.1)
Basophils Relative: 1 %
Eosinophils Absolute: 0.9 10*3/uL — ABNORMAL HIGH (ref 0.0–0.5)
Eosinophils Relative: 19 %
HCT: 34.5 % — ABNORMAL LOW (ref 36.0–46.0)
Hemoglobin: 11.7 g/dL — ABNORMAL LOW (ref 12.0–15.0)
Immature Granulocytes: 0 %
Lymphocytes Relative: 31 %
Lymphs Abs: 1.5 10*3/uL (ref 0.7–4.0)
MCH: 31.9 pg (ref 26.0–34.0)
MCHC: 33.9 g/dL (ref 30.0–36.0)
MCV: 94 fL (ref 80.0–100.0)
Monocytes Absolute: 0.5 10*3/uL (ref 0.1–1.0)
Monocytes Relative: 10 %
Neutro Abs: 1.9 10*3/uL (ref 1.7–7.7)
Neutrophils Relative %: 39 %
Platelet Count: 182 10*3/uL (ref 150–400)
RBC: 3.67 MIL/uL — ABNORMAL LOW (ref 3.87–5.11)
RDW: 13.4 % (ref 11.5–15.5)
WBC Count: 4.8 10*3/uL (ref 4.0–10.5)
nRBC: 0 % (ref 0.0–0.2)

## 2019-12-08 LAB — CMP (CANCER CENTER ONLY)
ALT: 8 U/L (ref 0–44)
AST: 15 U/L (ref 15–41)
Albumin: 3.8 g/dL (ref 3.5–5.0)
Alkaline Phosphatase: 71 U/L (ref 38–126)
Anion gap: 9 (ref 5–15)
BUN: 23 mg/dL (ref 8–23)
CO2: 33 mmol/L — ABNORMAL HIGH (ref 22–32)
Calcium: 9.9 mg/dL (ref 8.9–10.3)
Chloride: 97 mmol/L — ABNORMAL LOW (ref 98–111)
Creatinine: 0.94 mg/dL (ref 0.44–1.00)
GFR, Est AFR Am: >60 mL/min (ref >60)
GFR, Estimated: >60 mL/min (ref >60)
Glucose, Bld: 115 mg/dL — ABNORMAL HIGH (ref 70–99)
Potassium: 2.6 mmol/L — CL (ref 3.5–5.1)
Sodium: 139 mmol/L (ref 135–145)
Total Bilirubin: 1.1 mg/dL (ref 0.3–1.2)
Total Protein: 7.5 g/dL (ref 6.5–8.1)

## 2019-12-08 LAB — MAGNESIUM: Magnesium: 1.7 mg/dL (ref 1.7–2.4)

## 2019-12-08 LAB — TSH: TSH: 1.098 u[IU]/mL (ref 0.308–3.960)

## 2019-12-08 MED ORDER — HEPARIN SOD (PORK) LOCK FLUSH 100 UNIT/ML IV SOLN
500.0000 [IU] | Freq: Once | INTRAVENOUS | Status: AC | PRN
  Administered 2019-12-08: 500 [IU]
  Filled 2019-12-08: qty 5

## 2019-12-08 MED ORDER — BENZONATATE 100 MG PO CAPS: 100.0000 mg | ORAL_CAPSULE | Freq: Three times a day (TID) | ORAL | 1 refills | Status: DC | PRN

## 2019-12-08 MED ORDER — POTASSIUM CHLORIDE CRYS ER 20 MEQ PO TBCR: 20.0000 meq | EXTENDED_RELEASE_TABLET | Freq: Two times a day (BID) | ORAL | 0 refills | Status: DC

## 2019-12-08 MED ORDER — SODIUM CHLORIDE 0.9% FLUSH
10.0000 mL | INTRAVENOUS | Status: DC | PRN
  Administered 2019-12-08: 10 mL
  Filled 2019-12-08: qty 10

## 2019-12-08 MED ORDER — SODIUM CHLORIDE 0.9 % IV SOLN
Freq: Once | INTRAVENOUS | Status: AC
  Filled 2019-12-08: qty 1000

## 2019-12-08 MED ORDER — SODIUM CHLORIDE 0.9 % IV SOLN
200.0000 mg | Freq: Once | INTRAVENOUS | Status: AC
  Administered 2019-12-08: 200 mg via INTRAVENOUS
  Filled 2019-12-08: qty 8

## 2019-12-08 MED ORDER — SODIUM CHLORIDE 0.9 % IV SOLN
Freq: Once | INTRAVENOUS | Status: AC
  Filled 2019-12-08: qty 250

## 2019-12-08 NOTE — Progress Notes (Signed)
CRITICAL VALUE STICKER  CRITICAL VALUE: K+ 2.6  RECEIVER (on-site recipient of call):Farhiya Rosten,RN   DATE & TIME NOTIFIED: 12/08/19 @ 1056  MESSENGER (representative from lab): Pam  MD NOTIFIED: Cassie Hellingoetter, PA  TIME OF NOTIFICATION: 1100  RESPONSE: Currently seeing patient.

## 2019-12-08 NOTE — Patient Instructions (Signed)

## 2019-12-08 NOTE — Patient Instructions (Signed)
[place] Discharge Instructions for Patients Receiving Chemotherapy  Today you received the following chemotherapy agents: Keytruda.  To help prevent nausea and vomiting after your treatment, we encourage you to take your nausea medication as directed.   If you develop nausea and vomiting that is not controlled by your nausea medication, call the clinic.   BELOW ARE SYMPTOMS THAT SHOULD BE REPORTED IMMEDIATELY:  *FEVER GREATER THAN 100.5 F  *CHILLS WITH OR WITHOUT FEVER  NAUSEA AND VOMITING THAT IS NOT CONTROLLED WITH YOUR NAUSEA MEDICATION  *UNUSUAL SHORTNESS OF BREATH  *UNUSUAL BRUISING OR BLEEDING  TENDERNESS IN MOUTH AND THROAT WITH OR WITHOUT PRESENCE OF ULCERS  *URINARY PROBLEMS  *BOWEL PROBLEMS  UNUSUAL RASH Items with * indicate a potential emergency and should be followed up as soon as possible.  Feel free to call the clinic should you have any questions or concerns. The clinic phone number is [phone].  Please show the [facility] at check-in to the Emergency Department and triage nurse.   Hypokalemia Hypokalemia means that the amount of potassium in the blood is lower than normal. Potassium is a chemical (electrolyte) that helps regulate the amount of fluid in the body. It also stimulates muscle tightening (contraction) and helps nerves work properly. Normally, most of the body's potassium is inside cells, and only a very small amount is in the blood. Because the amount in the blood is so small, minor changes to potassium levels in the blood can be life-threatening. What are the causes? This condition may be caused by:  Antibiotic medicine.  Diarrhea or vomiting. Taking too much of a medicine that helps you have a bowel movement (laxative) can cause diarrhea and lead to hypokalemia.  Chronic kidney disease (CKD).  Medicines that help the body get rid of excess fluid (diuretics).  Eating disorders, such as bulimia.  Low  magnesium levels in the body.  Sweating a lot. What are the signs or symptoms? Symptoms of this condition include:  Weakness.  Constipation.  Fatigue.  Muscle cramps.  Mental confusion.  Skipped heartbeats or irregular heartbeat (palpitations).  Tingling or numbness. How is this diagnosed? This condition is diagnosed with a blood test. How is this treated? This condition may be treated by:  Taking potassium supplements by mouth.  Adjusting the medicines that you take.  Eating more foods that contain a lot of potassium. If your potassium level is very low, you may need to get potassium through an IV and be monitored in the hospital. Follow these instructions at home:   Take over-the-counter and prescription medicines only as told by your health care provider. This includes vitamins and supplements.  Eat a healthy diet. A healthy diet includes fresh fruits and vegetables, whole grains, healthy fats, and lean proteins.  If instructed, eat more foods that contain a lot of potassium. This includes: ? Nuts, such as peanuts and pistachios. ? Seeds, such as sunflower seeds and pumpkin seeds. ? Peas, lentils, and lima beans. ? Whole grain and bran cereals and breads. ? Fresh fruits and vegetables, such as apricots, avocado, bananas, cantaloupe, kiwi, oranges, tomatoes, asparagus, and potatoes. ? Orange juice. ? Tomato juice. ? Red meats. ? Yogurt.  Keep all follow-up visits as told by your health care provider. This is important. Contact a health care provider if you:  Have weakness that gets worse.  Feel your heart pounding or racing.  Vomit.  Have diarrhea.  Have diabetes (diabetes mellitus) and you have trouble keeping your  blood sugar (glucose) in your target range. Get help right away if you:  Have chest pain.  Have shortness of breath.  Have vomiting or diarrhea that lasts for more than 2 days.  Faint. Summary  Hypokalemia means that the amount of  potassium in the blood is lower than normal.  This condition is diagnosed with a blood test.  Hypokalemia may be treated by taking potassium supplements, adjusting the medicines that you take, or eating more foods that are high in potassium.  If your potassium level is very low, you may need to get potassium through an IV and be monitored in the hospital. This information is not intended to replace advice given to you by your health care provider. Make sure you discuss any questions you have with your health care provider. Document Revised: 10/22/2017 Document Reviewed: 10/22/2017 Elsevier Patient Education  [place].

## 2019-12-09 ENCOUNTER — Telehealth: Payer: Self-pay | Admitting: Internal Medicine

## 2019-12-09 NOTE — Telephone Encounter (Signed)
Scheduled per los. Called and spoke with patient. Confirmed appt 

## 2019-12-16 ENCOUNTER — Ambulatory Visit
Admission: RE | Admit: 2019-12-16 | Discharge: 2019-12-16 | Disposition: A | Discharge status: Home / Self Care | Payer: Medicare HMO | Source: Ambulatory Visit | Attending: Student in an Organized Health Care Education/Training Program | Admitting: Student in an Organized Health Care Education/Training Program

## 2019-12-16 ENCOUNTER — Other Ambulatory Visit: Payer: Self-pay

## 2019-12-16 DIAGNOSIS — Z1231 Encounter for screening mammogram for malignant neoplasm of breast: Secondary | ICD-10-CM

## 2019-12-17 ENCOUNTER — Encounter: Payer: Self-pay | Admitting: Internal Medicine

## 2019-12-17 NOTE — Progress Notes (Signed)
After reviewing the application, [employer] have determined that pt does not meet the program's eligibility requirements at this time.  As a result, they are not able to provide her with Xarelto.  They have notified pt of this decision.

## 2019-12-27 ENCOUNTER — Ambulatory Visit (HOSPITAL_COMMUNITY)
Admission: RE | Admit: 2019-12-27 | Discharge: 2019-12-27 | Disposition: A | Discharge status: Home / Self Care | Payer: Medicare HMO | Source: Ambulatory Visit | Attending: Physician Assistant | Admitting: Physician Assistant

## 2019-12-27 ENCOUNTER — Ambulatory Visit (HOSPITAL_COMMUNITY)
Admission: RE | Admit: 2019-12-27 | Discharge: 2019-12-27 | Disposition: A | Discharge status: Home / Self Care | Payer: Medicare HMO | Source: Ambulatory Visit | Attending: Internal Medicine | Admitting: Internal Medicine

## 2019-12-27 ENCOUNTER — Encounter: Payer: Self-pay | Admitting: Student in an Organized Health Care Education/Training Program

## 2019-12-27 ENCOUNTER — Ambulatory Visit (INDEPENDENT_AMBULATORY_CARE_PROVIDER_SITE_OTHER): Payer: Medicare HMO | Admitting: Student in an Organized Health Care Education/Training Program

## 2019-12-27 ENCOUNTER — Other Ambulatory Visit: Payer: Self-pay

## 2019-12-27 DIAGNOSIS — I8290 Acute embolism and thrombosis of unspecified vein: Secondary | ICD-10-CM

## 2019-12-27 DIAGNOSIS — J449 Chronic obstructive pulmonary disease, unspecified: Secondary | ICD-10-CM | POA: Diagnosis not present

## 2019-12-27 DIAGNOSIS — J439 Emphysema, unspecified: Secondary | ICD-10-CM

## 2019-12-27 DIAGNOSIS — I10 Essential (primary) hypertension: Secondary | ICD-10-CM | POA: Diagnosis not present

## 2019-12-27 DIAGNOSIS — C3491 Malignant neoplasm of unspecified part of right bronchus or lung: Secondary | ICD-10-CM | POA: Diagnosis not present

## 2019-12-27 DIAGNOSIS — C3490 Malignant neoplasm of unspecified part of unspecified bronchus or lung: Secondary | ICD-10-CM | POA: Insufficient documentation

## 2019-12-27 MED ORDER — GADOBUTROL 1 MMOL/ML IV SOLN
6.0000 mL | Freq: Once | INTRAVENOUS | Status: AC | PRN
  Administered 2019-12-27: 6 mL via INTRAVENOUS

## 2019-12-27 MED ORDER — IOHEXOL 300 MG/ML  SOLN
100.0000 mL | Freq: Once | INTRAMUSCULAR | Status: AC | PRN
  Administered 2019-12-27: 100 mL via INTRAVENOUS

## 2019-12-27 MED ORDER — TIOTROPIUM BROMIDE-OLODATEROL 2.5-2.5 MCG/ACT IN AERS: 2.0000 | INHALATION_SPRAY | Freq: Every day | RESPIRATORY_TRACT | 5 refills | Status: DC

## 2019-12-27 MED ORDER — IOHEXOL 9 MG/ML PO SOLN
ORAL | Status: AC
  Administered 2019-12-27: 500 mL via ORAL
  Filled 2019-12-27: qty 1000

## 2019-12-27 MED ORDER — HEPARIN SOD (PORK) LOCK FLUSH 100 UNIT/ML IV SOLN
500.0000 [IU] | Freq: Once | INTRAVENOUS | Status: AC
  Administered 2019-12-27: 500 [IU] via INTRAVENOUS

## 2019-12-27 MED ORDER — IOHEXOL 9 MG/ML PO SOLN
500.0000 mL | ORAL | Status: AC
  Administered 2019-12-27: 500 mL via ORAL

## 2019-12-27 MED ORDER — HEPARIN SOD (PORK) LOCK FLUSH 100 UNIT/ML IV SOLN
INTRAVENOUS | Status: AC
  Filled 2019-12-27: qty 5

## 2019-12-27 NOTE — Patient Instructions (Signed)
Is great seeing you today in clinic.  We made some adjustments to your medications including a new inhaler.  If this is too expensive with your insurance co-pay, please call me and I will try to find something that is better covered.

## 2019-12-27 NOTE — Assessment & Plan Note (Signed)
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  She tolerated about 5 months of anticoagulation with rivaroxaban, and discontinued it about a month ago due to an affordability of the medication even with her insurance coverage.  She shows no signs of superior vena cava syndrome today.  She is having a restaging CT later today as well, which should give [place] some insight into any further filling defects associated with the Port-A-Cath.  Overall because she is done so well with over 3 months of anticoagulation I think it is low risk to discontinue anticoagulation at this time.

## 2019-12-27 NOTE — Assessment & Plan Note (Signed)
Not currently well controlled, having daytime symptoms requiring albuterol several times per day.  Likely owing to poor access to Trelegy, it was too expensive with her insurance benefits.  I am going to prescribe Stiolto, which is a LAMA-LABA combination that she was well controlled with in the past.  Continue with albuterol as needed.  Advised her to please call me if this medication is also inaccessible and we can try something else on her formulary.

## 2019-12-27 NOTE — Progress Notes (Signed)
[place] OFFICE PROGRESS NOTE  [name], MD [address] [name] [address]  DIAGNOSIS: Stage IV non-small cell lung cancer, squamous cell carcinoma. Debra Rodgers presented withright upper lobe lung mass in addition to pleural-based metastasis andmediastinal lymphadenopathy.Debra Rodgers was diagnosed in September 2020.  PRIOR THERAPY:  Chemotherapy with carboplatin for an AUC of 5,paclitaxel 175 mg/m, and Keytruda 200 mg IV every 3 weeks with Neulasta support. Last dose .Status post18cycles. Starting from cycle #5 Debra Rodgers is currently on maintenance treatment with single agent Keytruda every 3 weeks.  CURRENT THERAPY: Chemotherapy with carboplatin for an AUC of 5,paclitaxel 175 mg/m, and Keytruda 200 mg IV every 3 weeks with Neulasta support. Lasttreatmenton 12/08/2019.Status post18cycles. Starting from cycle #5 Debra Rodgers is currently on maintenance treatment with single agent Keytruda every 3 weeks.  INTERVAL HISTORY: Debra Rodgers 74 y.o. female returns to the clinic today for a follow-up visit.  The patient is feeling fair today without any concerning complaints except for wheezing and worsening cough which produces clear phlegm. The patient recently had a follow up visit with Debra Rodgers PCP who recently changed around Debra Rodgers inhalers. Debra Rodgers does use Debra Rodgers albuterol inhaler though frequently due to the wheezing. Debra Rodgers did not pick them up yet. Debra Rodgers uses cough syrup for Debra Rodgers cough which helps. Debra Rodgers also takes Tessalon and Robitussin as needed for cough.  Otherwise, the patient continues to tolerate treatment with single agent immunotherapy with Keytruda without any adverse side effects.  Debra Rodgers denies any fever, chills, night sweats, or weight loss.  Debra Rodgers denies any chest pain or hemoptysis but reports Debra Rodgers baseline dyspnea on exertion. Debra Rodgers denies any nausea, vomiting, or constipation. Debra Rodgers states Debra Rodgers sometimes has intermittent diarrhea every other day or so. On days where Debra Rodgers has  diarrhea, Debra Rodgers states Debra Rodgers has approximately 3 loose stools daily. The patient has been endorsing headaches for the last several weeks and had a brain MRI to rule out evidence of metastatic disease.The patient had a restaging CT scan performed recently.  Debra Rodgers is here today for evaluation and to review Debra Rodgers scan results before starting cycle #19.  MEDICAL HISTORY: Past Medical History:  Diagnosis Date  . COPD (chronic obstructive pulmonary disease) ([place])   . Depression   . Essential hypertension   . Headache   . History of migraine headaches   . lung ca dx'd 10/2018  . Sleep apnea   . Tobacco use disorder     ALLERGIES:  is allergic to codeine sulfate and pantoprazole sodium.  MEDICATIONS:  Current Outpatient Medications  Medication Sig Dispense Refill  . albuterol (VENTOLIN HFA) 108 (90 Base) MCG/ACT inhaler Inhale 2 puffs into the lungs every 6 (six) hours as needed for wheezing or shortness of breath. 36 g 2  . amLODipine (NORVASC) 10 MG tablet Take 1 tablet (10 mg total) by mouth daily. 90 tablet 3  . atorvastatin (LIPITOR) 20 MG tablet Take 1 tablet (20 mg total) by mouth daily. 90 tablet 3  . chlorthalidone (HYGROTON) 50 MG tablet TAKE 1 TABLET EVERY DAY 90 tablet 3  . famotidine (PEPCID) 20 MG tablet Take 20 mg by mouth daily.     Marland Kitchen FLUoxetine (PROZAC) 20 MG capsule TAKE 1 CAPSULE EVERY DAY 90 capsule 3  . HYDROcodone-acetaminophen (NORCO) 7.5-325 MG tablet Take 1 tablet by mouth every 6 (six) hours as needed for moderate pain. 60 tablet 0  . lidocaine-prilocaine (EMLA) cream Apply 1 application topically as needed. (Patient taking differently: Apply 1 application topically as needed (  port access). ) 30 g 0  . polyvinyl alcohol (LIQUIFILM TEARS) 1.4 % ophthalmic solution Place 1 drop into both eyes as needed for dry eyes.    . potassium chloride SA (KLOR-CON) 20 MEQ tablet Take 1 tablet (20 mEq total) by mouth 2 (two) times daily. 20 tablet 0  . dexamethasone (DECADRON) 4 MG tablet  Please take TWO tablets TWICE a day the day before, the day of, and the day after chemotherapy 120 tablet 2  . fluticasone (FLONASE) 50 MCG/ACT nasal spray Place 1 spray into both nostrils daily. 11.1 mL 0  . Tiotropium Bromide-Olodaterol 2.5-2.5 MCG/ACT AERS Inhale 2 puffs into the lungs daily. (Patient not taking: Reported on 12/29/2019) 4 g 5   No current facility-administered medications for this visit.   Facility-Administered Medications Ordered in Other Visits  Medication Dose Route Frequency Provider Last Rate Last Admin  . influenza vaccine adjuvanted (FLUAD) injection 0.5 mL  0.5 mL Intramuscular Once Curt Bears, MD      . sodium chloride 0.9 % 1,000 mL with potassium chloride 40 mEq infusion   Intravenous Once Nejla Reasor L, PA-C        SURGICAL HISTORY:  Past Surgical History:  Procedure Laterality Date  . ABDOMINAL HYSTERECTOMY    . CHOLECYSTECTOMY    . IR IMAGING GUIDED PORT INSERTION  02/24/2019  . ROTATOR CUFF REPAIR  3/04  . VIDEO BRONCHOSCOPY WITH ENDOBRONCHIAL ULTRASOUND Right 11/25/2018   Procedure: VIDEO BRONCHOSCOPY WITH ENDOBRONCHIAL ULTRASOUND;  Surgeon: Collene Gobble, MD;  Location: [facility] OR;  Service: Thoracic;  Laterality: Right;    REVIEW OF SYSTEMS:   Constitutional: Negative for appetite change, chills, fatigue, fever and unexpected weight change.  HENT: Negative for mouth sores, nosebleeds, sore throat and trouble swallowing.  Eyes: Positive for bilateral blurry vision. Negative fordiplopia, erythema, oricterus.  Respiratory:Positive for baseline shortness of breath on exertion, wheezing, and cough.Negative for hemoptysis. Cardiovascular:Negative forchest pain orleg swelling.  Gastrointestinal: Positive for intermittent diarrhea. Negative for abdominal pain, constipation,  nausea and vomiting.  Genitourinary: Negative for bladder incontinence, difficulty urinating, dysuria, frequency and hematuria.  Musculoskeletal: Negative for back  pain, gait problem, neck pain and neck stiffness.  Skin: Positive for itching and rash on neck bilaterally.  Neurological:Positive for occasional headaches.Negative for dizziness, extremity weakness, light-headedness and seizures.  Hematological: Negative for adenopathy. Does not bruise/bleed easily.  Psychiatric/Behavioral: Negative for confusion, depression and sleep disturbance. The patient is not nervous/anxious.    PHYSICAL EXAMINATION:  Blood pressure 114/82, pulse 72, temperature 97.8 F (36.6 C), temperature source Tympanic, resp. rate 17, height 5' 1"  (1.549 m), weight 134 lb 6.4 oz (61 kg), SpO2 99 %.  ECOG PERFORMANCE STATUS: 1 - Symptomatic but completely ambulatory  Physical Exam  Constitutional: Oriented to person, place, and time and well-developed, well-nourished, and in no distress.  HENT:  Head: Normocephalic and atraumatic.  Mouth/Throat: Oropharynx is clear and moist. No oropharyngeal exudate.  Eyes: Conjunctivae are normal. Right eye exhibits no discharge. Left eye exhibits no discharge. No scleral icterus.  Neck: Normal range of motion. Neck supple.  Cardiovascular: Normal rate, regular rhythm, normal heart sounds and intact distal pulses.   Pulmonary/Chest: Effort normal. Positive for bilateral wheezing. No respiratory distress.  No rales.  Abdominal: Soft. Bowel sounds are normal. Exhibits no distension and no mass. There is no tenderness.  Musculoskeletal: Normal range of motion. Exhibits no edema.  Lymphadenopathy:    No cervical adenopathy.  Neurological: Alert and oriented to person, place, and time. Exhibits normal  muscle tone. Gait normal. Coordination normal.  Skin: Skin is warm and dry. Mild erythema on Debra Rodgers neck bilaterally without warmth or palpable rash. Not diaphoretic.  No pallor.  Psychiatric: Mood, memory and judgment normal.  Vitals reviewed.  LABORATORY DATA: Lab Results  Component Value Date   WBC 3.8 (L) 12/29/2019   HGB 12.0 12/29/2019    HCT 35.2 (L) 12/29/2019   MCV 95.4 12/29/2019   PLT 209 12/29/2019      Chemistry      Component Value Date/Time   NA 140 12/29/2019 1030   NA 139 07/21/2017 0958   K 2.7 (LL) 12/29/2019 1030   CL 99 12/29/2019 1030   CO2 38 (H) 12/29/2019 1030   BUN 19 12/29/2019 1030   BUN 26 07/21/2017 0958   CREATININE 1.02 (H) 12/29/2019 1030   CREATININE 0.79 09/03/2013 1540      Component Value Date/Time   CALCIUM 10.2 12/29/2019 1030   ALKPHOS 69 12/29/2019 1030   AST 15 12/29/2019 1030   ALT 10 12/29/2019 1030   BILITOT 1.1 12/29/2019 1030       RADIOGRAPHIC STUDIES:  CT Chest W Contrast  Result Date: 12/27/2019 CLINICAL DATA:  Stage IV right lung cancer diagnosed August 2020 with ongoing chemotherapy. Restaging. Patient reports worsening wheezing. EXAM: CT CHEST, ABDOMEN, AND PELVIS WITH CONTRAST TECHNIQUE: Multidetector CT imaging of the chest, abdomen and pelvis was performed following the standard protocol during bolus administration of intravenous contrast. CONTRAST:  127m OMNIPAQUE IOHEXOL 300 MG/ML  SOLN COMPARISON:  10/17/2019 CT chest, abdomen and pelvis. FINDINGS: CT CHEST FINDINGS Cardiovascular: Normal heart size. No significant pericardial effusion/thickening. Left anterior descending coronary atherosclerosis. Right internal jugular Port-A-Cath terminates at the cavoatrial junction. Atherosclerotic thoracic aorta with stable ectatic 4.0 cm ascending thoracic aorta. Stable dilated main pulmonary artery (3.5 cm diameter). No central pulmonary emboli. Mediastinum/Nodes: No discrete thyroid nodules. Unremarkable esophagus. No axillary adenopathy. Mild right paratracheal adenopathy up to the 1.3 cm short axis diameter (series 2/image 23), increased from 0.8 cm. Newly enlarged 1.1 cm right hilar node (series 2/image 27). No left hilar adenopathy. Lungs/Pleura: No pneumothorax. No pleural effusion. Severe centrilobular and paraseptal emphysema with diffuse bronchial wall thickening.  No acute consolidative airspace disease or lung masses. Numerous (greater than 15) new solid pulmonary nodules scattered throughout both lungs, largest 0.9 cm in the posterior right upper lobe (series 6/image 57), 0.9 cm in the basilar right lower lobe (series 6/image 106) and 0.7 cm in the superior segment left lower lobe (series 6/image 46). Previously described 1.2 cm solid left lower lobe pulmonary nodule on 10/17/2019 CT is decreased to 0.3 cm (series 6/image 69). Musculoskeletal: No aggressive appearing focal osseous lesions. Moderate thoracic spondylosis. CT ABDOMEN PELVIS FINDINGS Hepatobiliary: A few scattered small simple liver cysts, largest 1.1 cm in the peripheral right liver, unchanged. No new liver lesions. Normal size liver. Cholecystectomy. Bile ducts are stable and within normal post cholecystectomy limits. Pancreas: Normal, with no mass or duct dilation. Spleen: Normal size. No mass. Adrenals/Urinary Tract: Normal adrenals. Normal kidneys with no hydronephrosis and no renal mass. Normal bladder. Stomach/Bowel: Normal non-distended stomach. Normal caliber small bowel with no small bowel wall thickening. Oral contrast transits to the colon. Appendix not discretely visualized. Mild sigmoid diverticulosis with no large bowel wall thickening or acute pericolonic fat stranding. Vascular/Lymphatic: Atherosclerotic abdominal aorta with 4.0 cm suprarenal abdominal aortic aneurysm, stable. Patent portal, splenic, hepatic and renal veins. No pathologically enlarged lymph nodes in the abdomen or pelvis. Reproductive: Hysterectomy.  No right adnexal mass. Cystic 7.3 x 7.3 cm left adnexal mass (series 2/image 90), stable. Other: No pneumoperitoneum, ascites or focal fluid collection. Musculoskeletal: No aggressive appearing focal osseous lesions. Marked lower lumbar spondylosis. IMPRESSION: 1. Findings are suggestive of progressive metastatic disease in the chest. Numerous new solid pulmonary nodules scattered  throughout both lungs, largest 0.9 cm, compatible with pulmonary metastases. New mild right hilar and increased mild right paratracheal adenopathy, suspicious for nodal metastases. 2. Previously described 1.2 cm solid left lower lobe pulmonary nodule is decreased in size and was presumably inflammatory. 3. No evidence of metastatic disease in the abdomen or pelvis. 4. Chronic findings include: Stable ectatic 4.0 cm ascending thoracic aorta. Stable 4.0 cm suprarenal abdominal aortic aneurysm. Stable dilated main pulmonary artery, suggesting chronic pulmonary arterial hypertension. Mild sigmoid diverticulosis. Aortic Atherosclerosis (ICD10-I70.0) and Emphysema (ICD10-J43.9). Electronically Signed   By: Ilona Sorrel M.D.   On: 12/27/2019 17:20   MR Brain W Wo Contrast  Result Date: 12/28/2019 CLINICAL DATA:  Headache and history of cancer. EXAM: MRI HEAD WITHOUT AND WITH CONTRAST TECHNIQUE: Multiplanar, multiecho pulse sequences of the brain and surrounding structures were obtained without and with intravenous contrast. CONTRAST:  73m GADAVIST GADOBUTROL 1 MMOL/ML IV SOLN COMPARISON:  11/19/2018 FINDINGS: Brain: No acute infarction, hemorrhage, hydrocephalus, or mass lesion. Probable small arachnoid cyst in the left middle cranial fossa measuring 13 mm. No abnormal intracranial enhancement. Age normal brain volume. Mild chronic small vessel ischemia in the cerebral white matter. Vascular: Normal flow voids and vascular enhancement Skull and upper cervical spine: Negative for marrow lesion. C3-4 ACDF. Sinuses/Orbits: Nasopharyngeal retention cysts. No mastoid or middle ear opacification. Other: Intermittent motion degradation. IMPRESSION: No evidence of metastatic disease. No specific explanation for headache. Motion degraded Electronically Signed   By: JMonte FantasiaM.D.   On: 12/28/2019 08:06   CT Abdomen Pelvis W Contrast  Result Date: 12/27/2019 CLINICAL DATA:  Stage IV right lung cancer diagnosed August  2020 with ongoing chemotherapy. Restaging. Patient reports worsening wheezing. EXAM: CT CHEST, ABDOMEN, AND PELVIS WITH CONTRAST TECHNIQUE: Multidetector CT imaging of the chest, abdomen and pelvis was performed following the standard protocol during bolus administration of intravenous contrast. CONTRAST:  1022mOMNIPAQUE IOHEXOL 300 MG/ML  SOLN COMPARISON:  10/17/2019 CT chest, abdomen and pelvis. FINDINGS: CT CHEST FINDINGS Cardiovascular: Normal heart size. No significant pericardial effusion/thickening. Left anterior descending coronary atherosclerosis. Right internal jugular Port-A-Cath terminates at the cavoatrial junction. Atherosclerotic thoracic aorta with stable ectatic 4.0 cm ascending thoracic aorta. Stable dilated main pulmonary artery (3.5 cm diameter). No central pulmonary emboli. Mediastinum/Nodes: No discrete thyroid nodules. Unremarkable esophagus. No axillary adenopathy. Mild right paratracheal adenopathy up to the 1.3 cm short axis diameter (series 2/image 23), increased from 0.8 cm. Newly enlarged 1.1 cm right hilar node (series 2/image 27). No left hilar adenopathy. Lungs/Pleura: No pneumothorax. No pleural effusion. Severe centrilobular and paraseptal emphysema with diffuse bronchial wall thickening. No acute consolidative airspace disease or lung masses. Numerous (greater than 15) new solid pulmonary nodules scattered throughout both lungs, largest 0.9 cm in the posterior right upper lobe (series 6/image 57), 0.9 cm in the basilar right lower lobe (series 6/image 106) and 0.7 cm in the superior segment left lower lobe (series 6/image 46). Previously described 1.2 cm solid left lower lobe pulmonary nodule on 10/17/2019 CT is decreased to 0.3 cm (series 6/image 69). Musculoskeletal: No aggressive appearing focal osseous lesions. Moderate thoracic spondylosis. CT ABDOMEN PELVIS FINDINGS Hepatobiliary: A few scattered small simple liver cysts,  largest 1.1 cm in the peripheral right liver,  unchanged. No new liver lesions. Normal size liver. Cholecystectomy. Bile ducts are stable and within normal post cholecystectomy limits. Pancreas: Normal, with no mass or duct dilation. Spleen: Normal size. No mass. Adrenals/Urinary Tract: Normal adrenals. Normal kidneys with no hydronephrosis and no renal mass. Normal bladder. Stomach/Bowel: Normal non-distended stomach. Normal caliber small bowel with no small bowel wall thickening. Oral contrast transits to the colon. Appendix not discretely visualized. Mild sigmoid diverticulosis with no large bowel wall thickening or acute pericolonic fat stranding. Vascular/Lymphatic: Atherosclerotic abdominal aorta with 4.0 cm suprarenal abdominal aortic aneurysm, stable. Patent portal, splenic, hepatic and renal veins. No pathologically enlarged lymph nodes in the abdomen or pelvis. Reproductive: Hysterectomy. No right adnexal mass. Cystic 7.3 x 7.3 cm left adnexal mass (series 2/image 90), stable. Other: No pneumoperitoneum, ascites or focal fluid collection. Musculoskeletal: No aggressive appearing focal osseous lesions. Marked lower lumbar spondylosis. IMPRESSION: 1. Findings are suggestive of progressive metastatic disease in the chest. Numerous new solid pulmonary nodules scattered throughout both lungs, largest 0.9 cm, compatible with pulmonary metastases. New mild right hilar and increased mild right paratracheal adenopathy, suspicious for nodal metastases. 2. Previously described 1.2 cm solid left lower lobe pulmonary nodule is decreased in size and was presumably inflammatory. 3. No evidence of metastatic disease in the abdomen or pelvis. 4. Chronic findings include: Stable ectatic 4.0 cm ascending thoracic aorta. Stable 4.0 cm suprarenal abdominal aortic aneurysm. Stable dilated main pulmonary artery, suggesting chronic pulmonary arterial hypertension. Mild sigmoid diverticulosis. Aortic Atherosclerosis (ICD10-I70.0) and Emphysema (ICD10-J43.9). Electronically  Signed   By: Ilona Sorrel M.D.   On: 12/27/2019 17:20   MM 3D SCREEN BREAST BILATERAL  Result Date: 12/17/2019 CLINICAL DATA:  Screening. EXAM: DIGITAL SCREENING BILATERAL MAMMOGRAM WITH TOMO AND CAD COMPARISON:  Previous exam(s). ACR Breast Density Category c: The breast tissue is heterogeneously dense, which may obscure small masses. FINDINGS: There are no findings suspicious for malignancy. Images were processed with CAD. IMPRESSION: No mammographic evidence of malignancy. A result letter of this screening mammogram will be mailed directly to the patient. RECOMMENDATION: Screening mammogram in one year. (Code:[id number]) BI-RADS CATEGORY  1: Negative. Electronically Signed   By: Margarette Canada M.D.   On: 12/17/2019 13:20     ASSESSMENT/PLAN:  This is a very pleasant 74 year old African-American female diagnosed with stage IV non-small cell lung cancer, squamous cell carcinoma. Debra Rodgers presented with a right upper lobe lung mass and pleural based metastases and mediastinal lymphadenopathy.Debra Rodgers was diagnosed in August 2020   The patient is currently undergoing systemicchemotherapy with carboplatin for an AUC of 5, paclitaxel 175 mg/m, and Keytruda 200 mg IV every 3 weeks with Neulasta support.Starting from cycle #5, Debra Rodgers is on maintenance with single agent Keytruda.Debra Rodgers is status post18cyclesof treatment and Debra Rodgers tolerated it wellwithout any concerning adverse effectsexcept for mild fatigue.  The patient recently had a restaging CT scan of the chest, abdomen, and pelvis recently.  Debra Rodgers also had a brain MRI performed to rule out evidence of metastatic disease to the brain due to Debra Rodgers recent complaints of headaches. Debra Rodgers has a headache today. Dr. Julien Nordmann personally and independently reviewed the scan and discussed the results with the patient today.  The scan showed numerous new solid pulmonary nodules scattered throughout both lungs.  Dr. Earlie Server discussed that this could be related to inflammation  versus metastatic disease, although he feels that this is likely related to metastatic disease.  Dr. Julien Nordmann had a lengthy discussion today with the  patient about Debra Rodgers current condition and options.  Dr. Julien Nordmann discussed that the patient can continue on the same treatment with a shorter interval follow-up with a CT scan to assess for resolution or progression of the pulmonary nodules versus switching treatment to systemic palliative chemotherapy with docetaxel and Cyramza.  The patient is interested in changing treatment to docetaxel and Cyramza due to the likelihood that Debra Rodgers findings are related to metastatic disease.  Debra Rodgers will start Debra Rodgers treatment on 01/05/2020.  The adverse side effects of treatment were discussed including but not limited to myelosuppression, alopecia, peripheral neuropathy, nausea, vomiting, liver, and kidney dysfunction as well as the adverse side effects of Cyramza which includes hemorrhage as well as GI perforation.  I sent several prescriptions to the patient's pharmacy including 4 mg of Decadron to take 2 tablets 2 times a day the day before, day of, and the day after chemotherapy.  Additionally, the patient's labs show persistent hypokalemia.  I sent a prescription of 40 meq of potassium chloride to take daily to take for the next 10 days or so.  Additionally, the patient will receive 40 mEq IV while in the infusion room today.  We will see the patient back for follow-up visit in 2 weeks for evaluation and a 1 week follow-up visit to manage any adverse side effects of treatment.  The patient was advised to call immediately if Debra Rodgers has any concerning symptoms in the interval. The patient voices understanding of current disease status and treatment options and is in agreement with the current care plan. All questions were answered. The patient knows to call the clinic with any problems, questions or concerns. We can certainly see the patient much sooner if necessary  No orders of  the defined types were placed in this encounter.    Jinger Middlesworth L Rudolpho Claxton, PA-C 12/29/19  ADDENDUM: Hematology/Oncology Attending: I had a face-to-face encounter with the patient today.  I recommended Debra Rodgers care plan.  This is a very pleasant 74 years old African-American female with stage IV non-small cell lung cancer, squamous cell carcinoma status post induction systemic chemotherapy with carboplatin, paclitaxel and Keytruda for 4 cycles followed by maintenance treatment with single agent Keytruda for 14 more cycles. The patient has been tolerating Debra Rodgers treatment well except for occasional abdominal pain. Debra Rodgers had repeat CT scan of the chest, abdomen pelvis performed recently.  I personally and independently reviewed the scan images and discussed the results with the patient today. Debra Rodgers scan showed development of new bilateral pulmonary nodules suspicious for disease progression and metastasis within the lung but no evidence of metastatic disease in the abdomen or pelvis. Debra Rodgers also had MRI of the brain performed recently for evaluation of intermittent headache and this was negative for malignancy. I had a lengthy discussion with the patient about Debra Rodgers current condition and treatment options.  I gave the patient the option of continuing Debra Rodgers current treatment with [facility] and close monitoring of the pulmonary nodules versus proceeding with second line systemic chemotherapy with docetaxel 75 mg/M2 and Cyramza 10 mg/KG every 3 weeks with Neulasta support.  The patient would like to switch to systemic chemotherapy at this point.  We discussed with Debra Rodgers the adverse effect of this treatment including but not limited to alopecia, myelosuppression, nausea and vomiting, peripheral neuropathy, liver or renal dysfunction as well as the risk of hemorrhage from the [place]. Debra Rodgers is expected to start the first cycle of this treatment next week. Debra Rodgers will come back for follow-up visit in 2  weeks for evaluation and  management of any adverse effect of Debra Rodgers treatment. The patient was advised to call immediately if Debra Rodgers has any concerning symptoms in the interval.  Disclaimer: This note was dictated with voice recognition software. Similar sounding words can inadvertently be transcribed and may be missed upon review. Eilleen Kempf, MD 12/29/19

## 2019-12-27 NOTE — Progress Notes (Signed)
   Assessment and Plan:  See Encounters tab for problem-based medical decision making.   __________________________________________________________  HPI:   74 year old person living with stage IV squamous cell carcinoma of the lung on palliative chemotherapy through the [facility], here for routine follow-up of her hypertension.  Is been about 6 months since I have seen her, she is doing really well.  Tolerating chemotherapy well, good days and bad days, but overall not too many adverse side effects.  She is remains independent, drives herself, good functional status.  Family life is going well.  Mood seems to be stable.  Good adherence to most of her medications.  Had some trouble affording Trelegy inhaler and rivaroxaban, both of which she stopped about a month ago.  Did not call our office with these issues.  She has shortness of breath on a daily basis, requires using albuterol about 3 times a day.  Overall not to function limiting.  One exacerbation leading to hospitalization last July for a few days.  Seems to have recovered well from that.  __________________________________________________________  Problem List: Patient Active Problem List   Diagnosis Date Noted  . Stage IV squamous cell carcinoma of right lung ([place]) 12/02/2018    Priority: High  . Abdominal aortic aneurysm (AAA) 35 to 39 mm in diameter ([place]) 11/05/2018    Priority: High  . COPD (chronic obstructive pulmonary disease) ([place]) 09/23/2016    Priority: High  . Essential hypertension 04/17/2015    Priority: High  . Major depressive disorder, recurrent episode ([place]) 04/17/2015    Priority: High  . Cancer associated pain 12/30/2018    Priority: Medium  . Glucose intolerance 10/02/2015    Priority: Medium  . Adenomatous colon polyp 10/02/2015    Priority: Medium  . Osteoarthritis cervical spine 05/26/2013    Priority: Medium  . GERD 06/25/2007    Priority: Medium  . Goals of care,  counseling/discussion 12/02/2018    Priority: Low  . BPPV (benign paroxysmal positional vertigo) 12/02/2016    Priority: Low  . OSA (obstructive sleep apnea) 10/02/2015    Priority: Low  . Hyperlipidemia 07/29/2006    Priority: Low  . DVT (deep venous thrombosis) ([place]) 07/12/2019  . Port-A-Cath in place 07/07/2019  . Hypokalemia 12/16/2018  . Encounter for antineoplastic chemotherapy 12/02/2018  . Encounter for antineoplastic immunotherapy 12/02/2018    Medications: Reconciled today in Epic __________________________________________________________  Physical Exam:  Vital Signs: Vitals:   12/27/19 0900  BP: 123/72  Pulse: 79  Temp: 98.1 F (36.7 C)  TempSrc: Oral  SpO2: 98%  Weight: 135 lb 8 oz (61.5 kg)  Height: 5' 1"  (1.549 m)    Gen: Well appearing, NAD Neck: No cervical LAD, No thyromegaly or nodules, No JVD. CV: RRR, no murmurs Pulm: Normal effort, clear to auscultation throughout, mild inspiratory wheezing on the right.  Ext: Warm, no edema, normal joints

## 2019-12-27 NOTE — Assessment & Plan Note (Signed)
Blood pressure well controlled.  Plan to continue with chlorthalidone and amlodipine.  Has been very hypokalemic recently, likely due to chemotherapy regimen.  Continuing with potassium 20 mEq twice daily.  Thankfully she is not very symptomatic.

## 2019-12-27 NOTE — Assessment & Plan Note (Signed)
Being managed by Dr. Earlie Server at the [facility].  Currently undergoing chemotherapy treatments, enjoying a good quality of life, independent, good functional status.  Advance care directive is completed and loaded in the chart under [facility].  Being treated with chronic Norco for cancer associated pain, prescribed by her nurse navigator.  Overall satisfied with her cancer care, and has reasonable expectations about the future.

## 2019-12-29 ENCOUNTER — Other Ambulatory Visit: Payer: Self-pay

## 2019-12-29 ENCOUNTER — Encounter: Payer: Self-pay | Admitting: Physician Assistant

## 2019-12-29 ENCOUNTER — Inpatient Hospital Stay: Payer: Medicare HMO

## 2019-12-29 ENCOUNTER — Other Ambulatory Visit: Payer: Self-pay | Admitting: Internal Medicine

## 2019-12-29 ENCOUNTER — Inpatient Hospital Stay: Payer: Medicare HMO | Attending: Physician Assistant | Admitting: Physician Assistant

## 2019-12-29 ENCOUNTER — Other Ambulatory Visit: Payer: Self-pay | Admitting: Student in an Organized Health Care Education/Training Program

## 2019-12-29 VITALS — BP 114/82 | HR 72 | Temp 97.8°F | Resp 17 | Ht 61.0 in | Wt 134.4 lb

## 2019-12-29 DIAGNOSIS — C3491 Malignant neoplasm of unspecified part of right bronchus or lung: Secondary | ICD-10-CM

## 2019-12-29 DIAGNOSIS — E876 Hypokalemia: Secondary | ICD-10-CM

## 2019-12-29 DIAGNOSIS — Z7189 Other specified counseling: Secondary | ICD-10-CM | POA: Diagnosis not present

## 2019-12-29 DIAGNOSIS — C782 Secondary malignant neoplasm of pleura: Secondary | ICD-10-CM | POA: Diagnosis not present

## 2019-12-29 DIAGNOSIS — C3411 Malignant neoplasm of upper lobe, right bronchus or lung: Secondary | ICD-10-CM | POA: Diagnosis not present

## 2019-12-29 DIAGNOSIS — Z79899 Other long term (current) drug therapy: Secondary | ICD-10-CM | POA: Diagnosis not present

## 2019-12-29 DIAGNOSIS — F17210 Nicotine dependence, cigarettes, uncomplicated: Secondary | ICD-10-CM | POA: Insufficient documentation

## 2019-12-29 DIAGNOSIS — F329 Major depressive disorder, single episode, unspecified: Secondary | ICD-10-CM | POA: Diagnosis not present

## 2019-12-29 DIAGNOSIS — I700 Atherosclerosis of aorta: Secondary | ICD-10-CM | POA: Diagnosis not present

## 2019-12-29 DIAGNOSIS — Z9221 Personal history of antineoplastic chemotherapy: Secondary | ICD-10-CM | POA: Diagnosis not present

## 2019-12-29 DIAGNOSIS — G4730 Sleep apnea, unspecified: Secondary | ICD-10-CM | POA: Insufficient documentation

## 2019-12-29 DIAGNOSIS — I10 Essential (primary) hypertension: Secondary | ICD-10-CM | POA: Insufficient documentation

## 2019-12-29 DIAGNOSIS — Z23 Encounter for immunization: Secondary | ICD-10-CM

## 2019-12-29 DIAGNOSIS — K5730 Diverticulosis of large intestine without perforation or abscess without bleeding: Secondary | ICD-10-CM | POA: Insufficient documentation

## 2019-12-29 DIAGNOSIS — J449 Chronic obstructive pulmonary disease, unspecified: Secondary | ICD-10-CM | POA: Insufficient documentation

## 2019-12-29 DIAGNOSIS — M47814 Spondylosis without myelopathy or radiculopathy, thoracic region: Secondary | ICD-10-CM | POA: Insufficient documentation

## 2019-12-29 DIAGNOSIS — I714 Abdominal aortic aneurysm, without rupture: Secondary | ICD-10-CM | POA: Insufficient documentation

## 2019-12-29 DIAGNOSIS — Z5112 Encounter for antineoplastic immunotherapy: Secondary | ICD-10-CM | POA: Diagnosis present

## 2019-12-29 DIAGNOSIS — Z95828 Presence of other vascular implants and grafts: Secondary | ICD-10-CM

## 2019-12-29 DIAGNOSIS — R5383 Other fatigue: Secondary | ICD-10-CM

## 2019-12-29 LAB — CMP (CANCER CENTER ONLY)
ALT: 10 U/L (ref 0–44)
AST: 15 U/L (ref 15–41)
Albumin: 3.8 g/dL (ref 3.5–5.0)
Alkaline Phosphatase: 69 U/L (ref 38–126)
Anion gap: 3 — ABNORMAL LOW (ref 5–15)
BUN: 19 mg/dL (ref 8–23)
CO2: 38 mmol/L — ABNORMAL HIGH (ref 22–32)
Calcium: 10.2 mg/dL (ref 8.9–10.3)
Chloride: 99 mmol/L (ref 98–111)
Creatinine: 1.02 mg/dL — ABNORMAL HIGH (ref 0.44–1.00)
GFR, Estimated: 55 mL/min — ABNORMAL LOW (ref >60)
Glucose, Bld: 93 mg/dL (ref 70–99)
Potassium: 2.7 mmol/L — CL (ref 3.5–5.1)
Sodium: 140 mmol/L (ref 135–145)
Total Bilirubin: 1.1 mg/dL (ref 0.3–1.2)
Total Protein: 7.5 g/dL (ref 6.5–8.1)

## 2019-12-29 LAB — CBC WITH DIFFERENTIAL (CANCER CENTER ONLY)
Abs Immature Granulocytes: 0.01 10*3/uL (ref 0.00–0.07)
Basophils Absolute: 0.1 10*3/uL (ref 0.0–0.1)
Basophils Relative: 2 %
Eosinophils Absolute: 0.5 10*3/uL (ref 0.0–0.5)
Eosinophils Relative: 12 %
HCT: 35.2 % — ABNORMAL LOW (ref 36.0–46.0)
Hemoglobin: 12 g/dL (ref 12.0–15.0)
Immature Granulocytes: 0 %
Lymphocytes Relative: 40 %
Lymphs Abs: 1.5 10*3/uL (ref 0.7–4.0)
MCH: 32.5 pg (ref 26.0–34.0)
MCHC: 34.1 g/dL (ref 30.0–36.0)
MCV: 95.4 fL (ref 80.0–100.0)
Monocytes Absolute: 0.4 10*3/uL (ref 0.1–1.0)
Monocytes Relative: 10 %
Neutro Abs: 1.4 10*3/uL — ABNORMAL LOW (ref 1.7–7.7)
Neutrophils Relative %: 36 %
Platelet Count: 209 10*3/uL (ref 150–400)
RBC: 3.69 MIL/uL — ABNORMAL LOW (ref 3.87–5.11)
RDW: 13.3 % (ref 11.5–15.5)
WBC Count: 3.8 10*3/uL — ABNORMAL LOW (ref 4.0–10.5)
nRBC: 0 % (ref 0.0–0.2)

## 2019-12-29 LAB — TSH: TSH: 1.221 u[IU]/mL (ref 0.308–3.960)

## 2019-12-29 MED ORDER — SODIUM CHLORIDE 0.9% FLUSH
10.0000 mL | INTRAVENOUS | Status: DC | PRN
  Administered 2019-12-29: 10 mL
  Filled 2019-12-29: qty 10

## 2019-12-29 MED ORDER — ALTEPLASE 2 MG IJ SOLR
2.0000 mg | Freq: Once | INTRAMUSCULAR | Status: DC | PRN
  Filled 2019-12-29: qty 2

## 2019-12-29 MED ORDER — HEPARIN SOD (PORK) LOCK FLUSH 100 UNIT/ML IV SOLN
500.0000 [IU] | Freq: Once | INTRAVENOUS | Status: AC | PRN
  Administered 2019-12-29: 500 [IU]
  Filled 2019-12-29: qty 5

## 2019-12-29 MED ORDER — DEXAMETHASONE 4 MG PO TABS: ORAL_TABLET | ORAL | 2 refills | Status: DC

## 2019-12-29 MED ORDER — INFLUENZA VAC A&B SA ADJ QUAD 0.5 ML IM PRSY: 0.5000 mL | PREFILLED_SYRINGE | Freq: Once | INTRAMUSCULAR | Status: DC

## 2019-12-29 MED ORDER — SPIRIVA HANDIHALER 18 MCG IN CAPS: 18.0000 ug | ORAL_CAPSULE | Freq: Every day | RESPIRATORY_TRACT | 2 refills | Status: DC

## 2019-12-29 MED ORDER — SODIUM CHLORIDE 0.9 % IV SOLN
Freq: Once | INTRAVENOUS | Status: AC
  Filled 2019-12-29: qty 1000

## 2019-12-29 MED ORDER — POTASSIUM CHLORIDE CRYS ER 20 MEQ PO TBCR: 20.0000 meq | EXTENDED_RELEASE_TABLET | Freq: Two times a day (BID) | ORAL | 0 refills | Status: DC

## 2019-12-29 NOTE — Patient Instructions (Signed)
-  We covered a lot of important information at your appointment today regarding what the treatment plan is moving forward. Here are the the main points that were discussed at your office visit with Debra Rodgers today:  -The treatment that you will receive consists of two chemotherapy drugs, called Docetaxel and Cyramza (Ramucirumab) -We are planning on starting your treatment next week on 01/05/20  -Your treatment will be given once every 3 weeks. We will check your labs once a week just to make sure that important components of your blood are in an acceptable range -You will receive an injection about 3 days after chemotherapy. The purpose of this injection is to boost your bodies infection fighting cells to protect you from getting an infection.  -We will get a CT scan after 3 treatments to check on the progress of treatment  Medications:  -I have sent a few important medication prescriptions to your pharmacy.  -Compazine was sent to your pharmacy. This medication is for nausea. You may take this every 6 hours as needed if you feel nausous.  -I have sent a prescription for Decadron (Dexamethasone) to your pharmacy. This medication is a steroids. The purpose of this medication is because it is one if the pre-medications that is needed before you get treatment. You will need to take Two Tablets Twice a day (for a total of 4 tablets a day) the day before, the day of, and the day after chemotherapy. This means that you will take 12 tablets total over those 3 consecutive days.   Side effects: The side effects of treatment may include but are not limited to alopecia (losing your hair), myelosuppression (dropping your blood counts), nausea and vomiting, peripheral neuropathy (numbness in the hands and feet), liver or renal dysfunction as well as the adverse effect of Cyramza including increased risk for hemorrhage (bleeding) and GI perforation. Please seek medical attention if you develop concerning or significant  bleeding.   Follow up:  -We will see you back for a follow up visit in 2 weeks.

## 2019-12-29 NOTE — Telephone Encounter (Signed)
Dr Evette Doffing started pt on a new inhale; she is not liking it pls contact [phone]  Need refill on potassium chloride SA (KLOR-CON) 20 MEQ tablet ;pt contact [phone]   [id number] [place], [place] - [address]

## 2019-12-29 NOTE — Progress Notes (Signed)
DISCONTINUE ON PATHWAY REGIMEN - Non-Small Cell Lung     A cycle is every 21 days:     Pembrolizumab      Paclitaxel      Carboplatin   **Always confirm dose/schedule in your pharmacy ordering system**  REASON: Disease Progression PRIOR TREATMENT: CQF901: Pembrolizumab 200 mg + Carboplatin AUC=6 + Paclitaxel 200 mg/m2 q21 Days x 4-6 Cycles TREATMENT RESPONSE: Progressive Disease (PD)  START ON PATHWAY REGIMEN - Non-Small Cell Lung     A cycle is every 21 days:     Ramucirumab      Docetaxel   **Always confirm dose/schedule in your pharmacy ordering system**  Patient Characteristics: Stage IV Metastatic, Squamous, PS = 0, 1, Second Line, Prior PD-1/PD-L1 Inhibitor + Chemotherapy or No Prior PD-1/PD-L1 Inhibitor, and Not a Candidate for Immunotherapy Therapeutic Status: Stage IV Metastatic Histology: Squamous Cell Line of therapy: Second Line ECOG Performance Status: 1 PD-L1 Expression Status: Awaiting Test Results Immunotherapy Candidate Status: Not a Candidate for Immunotherapy Prior Immunotherapy Status: Prior PD-1/PD-L1 Inhibitor + Chemotherapy Intent of Therapy: Non-Curative / Palliative Intent, Discussed with Patient

## 2019-12-29 NOTE — Patient Instructions (Signed)

## 2019-12-29 NOTE — Telephone Encounter (Signed)
Returned call to patient. States the new inhaler was too expensive > $100.   Let her know that potassium was refilled today by the [facility].  Hubbard Hartshorn, BSN, RN-BC

## 2019-12-29 NOTE — Patient Instructions (Signed)
Potassium chloride injection What is this medicine? POTASSIUM CHLORIDE (poe TASS i um KLOOR ide) is a potassium supplement used to prevent and to treat low potassium. Potassium is important for the heart, muscles, and nerves. Too much or too little potassium in the body can cause serious problems. This medicine may be used for other purposes; ask your health care provider or pharmacist if you have questions. COMMON BRAND NAME(S): PROAMP What should I tell my health care provider before I take this medicine? They need to know if you have any of these conditions:  Addison's disease  dehydration  diabetes  heart disease  high levels of potassium in the blood  irregular heartbeat  kidney disease  recent severe burn  an unusual or allergic reaction to potassium, other medicines, foods, dyes, or preservatives  pregnant or trying to get pregnant  breast-feeding How should I use this medicine? This medicine is for infusion into a vein. It is given by a health care professional in a hospital or clinic setting. Talk to your pediatrician regarding the use of this medicine in children. Special care may be needed. Overdosage: If you think you have taken too much of this medicine contact a poison control center or emergency room at once. NOTE: This medicine is only for you. Do not share this medicine with others. What if I miss a dose? This does not apply. What may interact with this medicine? Do not take this medicine with any of the following medications:  certain diuretics such as spironolactone, triamterene  eplerenone  sodium polystyrene sulfonate This medicine may also interact with the following medications:  certain medicines for blood pressure or heart disease like lisinopril, losartan, quinapril, valsartan  medicines that lower your chance of fighting infection such as cyclosporine, tacrolimus  NSAIDs, medicines for pain and inflammation, like ibuprofen or  naproxen  other potassium supplements  salt substitutes This list may not describe all possible interactions. Give your health care provider a list of all the medicines, herbs, non-prescription drugs, or dietary supplements you use. Also tell them if you smoke, drink alcohol, or use illegal drugs. Some items may interact with your medicine. What should I watch for while using this medicine? Your condition will be monitored carefully while you are receiving this medicine. You may need blood work done while you are taking this medicine. What side effects may I notice from receiving this medicine? Side effects that you should report to your doctor or health care professional as soon as possible:  allergic reactions like skin rash, itching or hives, swelling of the face, lips, or tongue  breathing problems  confusion  fast, irregular heartbeat  feeling faint or lightheaded, falls  low blood pressure  numbness or tingling in hands or feet  pain, redness, or irritation at site where injected  unusually weak or tired  weakness, heaviness of legs Side effects that usually do not require medical attention (report to your doctor or health care professional if they continue or are bothersome):  diarrhea  nausea, vomiting  stomach pain This list may not describe all possible side effects. Call your doctor for medical advice about side effects. You may report side effects to FDA at 1-800-FDA-1088. Where should I keep my medicine? This drug is given in a hospital or clinic and will not be stored at home. NOTE: This sheet is a summary. It may not cover all possible information. If you have questions about this medicine, talk to your doctor, pharmacist, or health care provider.  2020 Elsevier/Gold Standard (2015-12-13 13:59:20)

## 2019-12-29 NOTE — Telephone Encounter (Signed)
[place]. Please thank her for letting us know. We have had trouble finding something on her formulary. Lets try spiriva[facility], hopefully will be more affordable.

## 2019-12-29 NOTE — Telephone Encounter (Signed)
Called pt to ask about the inhaler; no answer; left message to call the office.

## 2019-12-30 ENCOUNTER — Telehealth: Payer: Self-pay | Admitting: Medical Oncology

## 2019-12-30 ENCOUNTER — Other Ambulatory Visit: Payer: Self-pay | Admitting: Medical Oncology

## 2019-12-30 DIAGNOSIS — C3491 Malignant neoplasm of unspecified part of right bronchus or lung: Secondary | ICD-10-CM

## 2019-12-30 MED ORDER — DEXAMETHASONE 4 MG PO TABS: ORAL_TABLET | ORAL | 2 refills | Status: DC

## 2019-12-30 NOTE — Telephone Encounter (Signed)
Patient notified [facility]. She will call back if this med is not affordable. Hubbard Hartshorn, BSN, RN-BC

## 2019-12-30 NOTE — Telephone Encounter (Signed)
err

## 2020-01-03 ENCOUNTER — Telehealth: Payer: Self-pay | Admitting: Internal Medicine

## 2020-01-03 NOTE — Telephone Encounter (Signed)
Called and spoke with patient. Confirmed appt in [facility] and other appts scheduled here.

## 2020-01-04 ENCOUNTER — Other Ambulatory Visit: Payer: Self-pay | Admitting: Physician Assistant

## 2020-01-04 ENCOUNTER — Inpatient Hospital Stay: Payer: Medicare HMO

## 2020-01-04 ENCOUNTER — Telehealth: Payer: Self-pay | Admitting: Medical Oncology

## 2020-01-04 ENCOUNTER — Telehealth: Payer: Self-pay | Admitting: *Deleted

## 2020-01-04 ENCOUNTER — Other Ambulatory Visit: Payer: Self-pay

## 2020-01-04 ENCOUNTER — Telehealth: Payer: Self-pay | Admitting: Internal Medicine

## 2020-01-04 VITALS — BP 129/70 | HR 70 | Temp 98.8°F | Resp 17

## 2020-01-04 DIAGNOSIS — C3491 Malignant neoplasm of unspecified part of right bronchus or lung: Secondary | ICD-10-CM

## 2020-01-04 DIAGNOSIS — Z5112 Encounter for antineoplastic immunotherapy: Secondary | ICD-10-CM | POA: Diagnosis not present

## 2020-01-04 DIAGNOSIS — E876 Hypokalemia: Secondary | ICD-10-CM

## 2020-01-04 LAB — CMP (CANCER CENTER ONLY)
ALT: 7 U/L (ref 0–44)
AST: 13 U/L — ABNORMAL LOW (ref 15–41)
Albumin: 4.3 g/dL (ref 3.5–5.0)
Alkaline Phosphatase: 57 U/L (ref 38–126)
Anion gap: 10 (ref 5–15)
BUN: 34 mg/dL — ABNORMAL HIGH (ref 8–23)
CO2: 30 mmol/L (ref 22–32)
Calcium: 10.4 mg/dL — ABNORMAL HIGH (ref 8.9–10.3)
Chloride: 100 mmol/L (ref 98–111)
Creatinine: 1.07 mg/dL — ABNORMAL HIGH (ref 0.44–1.00)
GFR, Estimated: 52 mL/min — ABNORMAL LOW (ref >60)
Glucose, Bld: 119 mg/dL — ABNORMAL HIGH (ref 70–99)
Potassium: 3 mmol/L — CL (ref 3.5–5.1)
Sodium: 140 mmol/L (ref 135–145)
Total Bilirubin: 0.7 mg/dL (ref 0.3–1.2)
Total Protein: 7.5 g/dL (ref 6.5–8.1)

## 2020-01-04 LAB — CBC WITH DIFFERENTIAL (CANCER CENTER ONLY)
Abs Immature Granulocytes: 0.03 10*3/uL (ref 0.00–0.07)
Basophils Absolute: 0 10*3/uL (ref 0.0–0.1)
Basophils Relative: 0 %
Eosinophils Absolute: 0 10*3/uL (ref 0.0–0.5)
Eosinophils Relative: 0 %
HCT: 34.5 % — ABNORMAL LOW (ref 36.0–46.0)
Hemoglobin: 11.7 g/dL — ABNORMAL LOW (ref 12.0–15.0)
Immature Granulocytes: 0 %
Lymphocytes Relative: 16 %
Lymphs Abs: 1.3 10*3/uL (ref 0.7–4.0)
MCH: 32.1 pg (ref 26.0–34.0)
MCHC: 33.9 g/dL (ref 30.0–36.0)
MCV: 94.5 fL (ref 80.0–100.0)
Monocytes Absolute: 0.4 10*3/uL (ref 0.1–1.0)
Monocytes Relative: 5 %
Neutro Abs: 6.3 10*3/uL (ref 1.7–7.7)
Neutrophils Relative %: 79 %
Platelet Count: 227 10*3/uL (ref 150–400)
RBC: 3.65 MIL/uL — ABNORMAL LOW (ref 3.87–5.11)
RDW: 13 % (ref 11.5–15.5)
WBC Count: 7.9 10*3/uL (ref 4.0–10.5)
nRBC: 0 % (ref 0.0–0.2)

## 2020-01-04 LAB — TOTAL PROTEIN, URINE DIPSTICK: Protein, ur: NEGATIVE mg/dL

## 2020-01-04 MED ORDER — DIPHENHYDRAMINE HCL 50 MG/ML IJ SOLN
INTRAMUSCULAR | Status: AC
  Filled 2020-01-04: qty 1

## 2020-01-04 MED ORDER — SODIUM CHLORIDE 0.9 % IV SOLN
10.0000 mg/kg | Freq: Once | INTRAVENOUS | Status: AC
  Administered 2020-01-04: 600 mg via INTRAVENOUS
  Filled 2020-01-04: qty 50

## 2020-01-04 MED ORDER — SODIUM CHLORIDE 0.9% FLUSH
10.0000 mL | INTRAVENOUS | Status: DC | PRN
  Administered 2020-01-04: 10 mL
  Filled 2020-01-04: qty 10

## 2020-01-04 MED ORDER — SODIUM CHLORIDE 0.9 % IV SOLN
Freq: Once | INTRAVENOUS | Status: AC
  Filled 2020-01-04: qty 100

## 2020-01-04 MED ORDER — SODIUM CHLORIDE 0.9 % IV SOLN
10.0000 mg | Freq: Once | INTRAVENOUS | Status: AC
  Administered 2020-01-04: 10 mg via INTRAVENOUS
  Filled 2020-01-04: qty 10

## 2020-01-04 MED ORDER — SODIUM CHLORIDE 0.9 % IV SOLN
Freq: Once | INTRAVENOUS | Status: AC
  Filled 2020-01-04: qty 250

## 2020-01-04 MED ORDER — HEPARIN SOD (PORK) LOCK FLUSH 100 UNIT/ML IV SOLN
500.0000 [IU] | Freq: Once | INTRAVENOUS | Status: AC | PRN
  Administered 2020-01-04: 500 [IU]
  Filled 2020-01-04: qty 5

## 2020-01-04 MED ORDER — ACETAMINOPHEN 325 MG PO TABS
650.0000 mg | ORAL_TABLET | Freq: Once | ORAL | Status: AC
  Administered 2020-01-04: 650 mg via ORAL

## 2020-01-04 MED ORDER — DIPHENHYDRAMINE HCL 50 MG/ML IJ SOLN
50.0000 mg | Freq: Once | INTRAMUSCULAR | Status: AC
  Administered 2020-01-04: 50 mg via INTRAVENOUS

## 2020-01-04 MED ORDER — POTASSIUM CHLORIDE 10 MEQ/100ML IV SOLN
10.0000 meq | Freq: Once | INTRAVENOUS | Status: DC
  Filled 2020-01-04: qty 100

## 2020-01-04 MED ORDER — SODIUM CHLORIDE 0.9 % IV SOLN
75.0000 mg/m2 | Freq: Once | INTRAVENOUS | Status: AC
  Administered 2020-01-04: 120 mg via INTRAVENOUS
  Filled 2020-01-04: qty 12

## 2020-01-04 MED ORDER — ACETAMINOPHEN 325 MG PO TABS
ORAL_TABLET | ORAL | Status: AC
  Filled 2020-01-04: qty 2

## 2020-01-04 NOTE — Patient Instructions (Signed)

## 2020-01-04 NOTE — Telephone Encounter (Signed)
Richardson Landry from lab brought a panic potassium level of 3.0 to me. PA notified of results.

## 2020-01-04 NOTE — Telephone Encounter (Signed)
Scheduled appt per sch msg. Called and spoke with patient. Confirmed appt

## 2020-01-04 NOTE — Patient Instructions (Signed)
Potassium chloride injection What is this medicine? POTASSIUM CHLORIDE (poe TASS i um KLOOR ide) is a potassium supplement used to prevent and to treat low potassium. Potassium is important for the heart, muscles, and nerves. Too much or too little potassium in the body can cause serious problems. This medicine may be used for other purposes; ask your health care provider or pharmacist if you have questions. COMMON BRAND NAME(S): PROAMP What should I tell my health care provider before I take this medicine? They need to know if you have any of these conditions:  Addison's disease  dehydration  diabetes  heart disease  high levels of potassium in the blood  irregular heartbeat  kidney disease  recent severe burn  an unusual or allergic reaction to potassium, other medicines, foods, dyes, or preservatives  pregnant or trying to get pregnant  breast-feeding How should I use this medicine? This medicine is for infusion into a vein. It is given by a health care professional in a hospital or clinic setting. Talk to your pediatrician regarding the use of this medicine in children. Special care may be needed. Overdosage: If you think you have taken too much of this medicine contact a poison control center or emergency room at once. NOTE: This medicine is only for you. Do not share this medicine with others. What if I miss a dose? This does not apply. What may interact with this medicine? Do not take this medicine with any of the following medications:  certain diuretics such as spironolactone, triamterene  eplerenone  sodium polystyrene sulfonate This medicine may also interact with the following medications:  certain medicines for blood pressure or heart disease like lisinopril, losartan, quinapril, valsartan  medicines that lower your chance of fighting infection such as cyclosporine, tacrolimus  NSAIDs, medicines for pain and inflammation, like ibuprofen or  naproxen  other potassium supplements  salt substitutes This list may not describe all possible interactions. Give your health care provider a list of all the medicines, herbs, non-prescription drugs, or dietary supplements you use. Also tell them if you smoke, drink alcohol, or use illegal drugs. Some items may interact with your medicine. What should I watch for while using this medicine? Your condition will be monitored carefully while you are receiving this medicine. You may need blood work done while you are taking this medicine. What side effects may I notice from receiving this medicine? Side effects that you should report to your doctor or health care professional as soon as possible:  allergic reactions like skin rash, itching or hives, swelling of the face, lips, or tongue  breathing problems  confusion  fast, irregular heartbeat  feeling faint or lightheaded, falls  low blood pressure  numbness or tingling in hands or feet  pain, redness, or irritation at site where injected  unusually weak or tired  weakness, heaviness of legs Side effects that usually do not require medical attention (report to your doctor or health care professional if they continue or are bothersome):  diarrhea  nausea, vomiting  stomach pain This list may not describe all possible side effects. Call your doctor for medical advice about side effects. You may report side effects to FDA at 1-800-FDA-1088. Where should I keep my medicine? This drug is given in a hospital or clinic and will not be stored at home. NOTE: This sheet is a summary. It may not cover all possible information. If you have questions about this medicine, talk to your doctor, pharmacist, or health care provider.  2020 Elsevier/Gold Standard (2015-12-13 13:59:20) Ramucirumab injection What is this medicine? RAMUCIRUMAB (ra mue SIR ue mab) is a monoclonal antibody. It is used to treat stomach cancer, colorectal cancer,  liver cancer, and lung cancer. This medicine may be used for other purposes; ask your health care provider or pharmacist if you have questions. COMMON BRAND NAME(S): Cyramza What should I tell my health care provider before I take this medicine? They need to know if you have any of these conditions: bleeding disorders blood clots heart disease, including heart failure, heart attack, or chest pain (angina) high blood pressure infection (especially a virus infection such as chickenpox, cold sores, or herpes) protein in your urine recent or planning to have surgery stroke an unusual or allergic reaction to ramucirumab, other medicines, foods, dyes, or preservatives pregnant or trying to get pregnant breast-feeding How should I use this medicine? This medicine is for infusion into a vein. It is given by a health care professional in a hospital or clinic setting. Talk to your pediatrician regarding the use of this medicine in children. Special care may be needed. Overdosage: If you think you have taken too much of this medicine contact a poison control center or emergency room at once. NOTE: This medicine is only for you. Do not share this medicine with others. What if I miss a dose? It is important not to miss your dose. Call your doctor or health care professional if you are unable to keep an appointment. What may interact with this medicine? Interactions have not been studied. This list may not describe all possible interactions. Give your health care provider a list of all the medicines, herbs, non-prescription drugs, or dietary supplements you use. Also tell them if you smoke, drink alcohol, or use illegal drugs. Some items may interact with your medicine. What should I watch for while using this medicine? Your condition will be monitored carefully while you are receiving this medicine. You will need to to check your blood pressure and have your blood and urine tested while you are taking  this medicine. Your condition will be monitored carefully while you are receiving this medicine. This medicine may increase your risk to bruise or bleed. Call your doctor or health care professional if you notice any unusual bleeding. Before having surgery, talk to your health care provider to make sure it is ok. This drug can increase the risk of poor healing of your surgical site or wound. You will need to stop this drug for 28 days before surgery. After surgery, wait at least 2 weeks before restarting this drug. Make sure the surgical site or wound is healed enough before restarting this drug. Talk to your health care provider if questions. Do not become pregnant while taking this medicine or for 3 months after stopping it. Women should inform their doctor if they wish to become pregnant or think they might be pregnant. There is a potential for serious side effects to an unborn child. Talk to your health care professional or pharmacist for more information. Do not breast-feed an infant while taking this medicine or for 2 months after stopping it. This medicine may interfere with the ability to have a child. Talk with your doctor or health care professional if you are concerned about your fertility. What side effects may I notice from receiving this medicine? Side effects that you should report to your doctor or health care professional as soon as possible: allergic reactions like skin rash, itching or hives, breathing problems, swelling  of the face, lips, or tongue signs of infection - fever or chills, cough, sore throat chest pain or chest tightness confusion dizziness feeling faint or lightheaded, falls severe abdominal pain severe nausea, vomiting signs and symptoms of bleeding such as bloody or black, tarry stools; red or dark-brown urine; spitting up blood or brown material that looks like coffee grounds; red spots on the skin; unusual bruising or bleeding from the eye, gums, or nose signs  and symptoms of a blood clot such as breathing problems; changes in vision; chest pain; severe, sudden headache; pain, swelling, warmth in the leg; trouble speaking; sudden numbness or weakness of the face, arm or leg symptoms of a stroke: change in mental awareness, inability to talk or move one side of the body trouble walking, dizziness, loss of balance or coordination Side effects that usually do not require medical attention (report to your doctor or health care professional if they continue or are bothersome): cold, clammy skin constipation diarrhea headache nausea, vomiting stomach pain unusually slow heartbeat unusually weak or tired This list may not describe all possible side effects. Call your doctor for medical advice about side effects. You may report side effects to FDA at 1-800-FDA-1088. Where should I keep my medicine? This drug is given in a hospital or clinic and will not be stored at home. NOTE: This sheet is a summary. It may not cover all possible information. If you have questions about this medicine, talk to your doctor, pharmacist, or health care provider.  2020 Elsevier/Gold Standard (2019-01-06 11:17:50) Docetaxel injection What is this medicine? DOCETAXEL (doe se TAX el) is a chemotherapy drug. It targets fast dividing cells, like cancer cells, and causes these cells to die. This medicine is used to treat many types of cancers like breast cancer, certain stomach cancers, head and neck cancer, lung cancer, and prostate cancer. This medicine may be used for other purposes; ask your health care provider or pharmacist if you have questions. COMMON BRAND NAME(S): Docefrez, Taxotere What should I tell my health care provider before I take this medicine? They need to know if you have any of these conditions:  infection (especially a virus infection such as chickenpox, cold sores, or herpes)  liver disease  low blood counts, like low white cell, platelet, or red cell  counts  an unusual or allergic reaction to docetaxel, polysorbate 80, other chemotherapy agents, other medicines, foods, dyes, or preservatives  pregnant or trying to get pregnant  breast-feeding How should I use this medicine? This drug is given as an infusion into a vein. It is administered in a hospital or clinic by a specially trained health care professional. Talk to your pediatrician regarding the use of this medicine in children. Special care may be needed. Overdosage: If you think you have taken too much of this medicine contact a poison control center or emergency room at once. NOTE: This medicine is only for you. Do not share this medicine with others. What if I miss a dose? It is important not to miss your dose. Call your doctor or health care professional if you are unable to keep an appointment. What may interact with this medicine?  aprepitant  certain antibiotics like erythromycin or clarithromycin  certain antivirals for HIV or hepatitis  certain medicines for fungal infections like fluconazole, itraconazole, ketoconazole, posaconazole, or voriconazole  cimetidine  ciprofloxacin  conivaptan  cyclosporine  dronedarone  fluvoxamine  grapefruit juice  imatinib  verapamil This list may not describe all possible interactions. Give  your health care provider a list of all the medicines, herbs, non-prescription drugs, or dietary supplements you use. Also tell them if you smoke, drink alcohol, or use illegal drugs. Some items may interact with your medicine. What should I watch for while using this medicine? Your condition will be monitored carefully while you are receiving this medicine. You will need important blood work done while you are taking this medicine. Call your doctor or health care professional for advice if you get a fever, chills or sore throat, or other symptoms of a cold or flu. Do not treat yourself. This drug decreases your body's ability to fight  infections. Try to avoid being around people who are sick. Some products may contain alcohol. Ask your health care professional if this medicine contains alcohol. Be sure to tell all health care professionals you are taking this medicine. Certain medicines, like metronidazole and disulfiram, can cause an unpleasant reaction when taken with alcohol. The reaction includes flushing, headache, nausea, vomiting, sweating, and increased thirst. The reaction can last from 30 minutes to several hours. You may get drowsy or dizzy. Do not drive, use machinery, or do anything that needs mental alertness until you know how this medicine affects you. Do not stand or sit up quickly, especially if you are an older patient. This reduces the risk of dizzy or fainting spells. Alcohol may interfere with the effect of this medicine. Talk to your health care professional about your risk of cancer. You may be more at risk for certain types of cancer if you take this medicine. Do not become pregnant while taking this medicine or for 6 months after stopping it. Women should inform their doctor if they wish to become pregnant or think they might be pregnant. There is a potential for serious side effects to an unborn child. Talk to your health care professional or pharmacist for more information. Do not breast-feed an infant while taking this medicine or for 1 week after stopping it. Males who get this medicine must use a condom during sex with females who can get pregnant. If you get a woman pregnant, the baby could have birth defects. The baby could die before they are born. You will need to continue wearing a condom for 3 months after stopping the medicine. Tell your health care provider right away if your partner becomes pregnant while you are taking this medicine. This may interfere with the ability to father a child. You should talk to your doctor or health care professional if you are concerned about your fertility. What side  effects may I notice from receiving this medicine? Side effects that you should report to your doctor or health care professional as soon as possible:  allergic reactions like skin rash, itching or hives, swelling of the face, lips, or tongue  blurred vision  breathing problems  changes in vision  low blood counts - This drug may decrease the number of white blood cells, red blood cells and platelets. You may be at increased risk for infections and bleeding.  nausea and vomiting  pain, redness or irritation at site where injected  pain, tingling, numbness in the hands or feet  redness, blistering, peeling, or loosening of the skin, including inside the mouth  signs of decreased platelets or bleeding - bruising, pinpoint red spots on the skin, black, tarry stools, nosebleeds  signs of decreased red blood cells - unusually weak or tired, fainting spells, lightheadedness  signs of infection - fever or chills, cough, sore  throat, pain or difficulty passing urine  swelling of the ankle, feet, hands Side effects that usually do not require medical attention (report to your doctor or health care professional if they continue or are bothersome):  constipation  diarrhea  fingernail or toenail changes  hair loss  loss of appetite  mouth sores  muscle pain This list may not describe all possible side effects. Call your doctor for medical advice about side effects. You may report side effects to FDA at 1-800-FDA-1088. Where should I keep my medicine? This drug is given in a hospital or clinic and will not be stored at home. NOTE: This sheet is a summary. It may not cover all possible information. If you have questions about this medicine, talk to your doctor, pharmacist, or health care provider.  2020 Elsevier/Gold Standard (2018-11-05 10:19:06)

## 2020-01-05 ENCOUNTER — Other Ambulatory Visit: Payer: Self-pay | Admitting: Physician Assistant

## 2020-01-05 DIAGNOSIS — C3491 Malignant neoplasm of unspecified part of right bronchus or lung: Secondary | ICD-10-CM

## 2020-01-05 NOTE — Telephone Encounter (Signed)
orders obtained.

## 2020-01-06 ENCOUNTER — Other Ambulatory Visit: Payer: Self-pay | Admitting: Internal Medicine

## 2020-01-06 ENCOUNTER — Other Ambulatory Visit: Payer: Self-pay

## 2020-01-06 ENCOUNTER — Inpatient Hospital Stay: Payer: Medicare HMO

## 2020-01-06 VITALS — BP 135/81 | HR 64 | Resp 18

## 2020-01-06 DIAGNOSIS — C3491 Malignant neoplasm of unspecified part of right bronchus or lung: Secondary | ICD-10-CM

## 2020-01-06 DIAGNOSIS — Z5112 Encounter for antineoplastic immunotherapy: Secondary | ICD-10-CM | POA: Diagnosis not present

## 2020-01-06 MED ORDER — PEGFILGRASTIM-JMDB 6 MG/0.6ML ~~LOC~~ SOSY
PREFILLED_SYRINGE | SUBCUTANEOUS | Status: AC
  Filled 2020-01-06: qty 0.6

## 2020-01-06 MED ORDER — PEGFILGRASTIM-JMDB 6 MG/0.6ML ~~LOC~~ SOSY
6.0000 mg | PREFILLED_SYRINGE | Freq: Once | SUBCUTANEOUS | Status: AC
  Administered 2020-01-06: 6 mg via SUBCUTANEOUS

## 2020-01-06 NOTE — Patient Instructions (Signed)

## 2020-01-10 NOTE — Progress Notes (Signed)
[facility] OFFICE PROGRESS NOTE  [name], MD [address] [place] [address]  DIAGNOSIS: Stage IV non-small cell lung cancer, squamous cell carcinoma. She presented withright upper lobe lung mass in addition to pleural-based metastasis andmediastinal lymphadenopathy.She was diagnosed in September 2020.  PRIOR THERAPY: Chemotherapy with carboplatin for an AUC of 5,paclitaxel 175 mg/m, and Keytruda 200 mg IV every 3 weeks with Neulasta support. Last dose 12/08/19.Status post18cycles. Starting from cycle #5 was on maintenance treatment with single agent Keytruda every 3 weeks.This was discontinued due to evidence of disease progression.   CURRENT THERAPY: Palliative systemic chemotherapy with docetaxel 75 mg per metered squared and Cyramza 10 mg/kg IV every 3 weeks with Neulasta support. First dose on 01/04/20  INTERVAL HISTORY: Debra Rodgers 74 y.o. female returns to the clinic today for a follow-up visit. The patient is feeling fairly well today without any concerning complaints. The patient recently had evidence of disease progression and her treatment was subsequently switched to docetaxel and Cyramza. She had her first cycle of treatment last week and tolerated it well without any major concerning side effects except for some mild achiness in her shoulder and back secondary to the Neulasta injection as well as mild diarrhea that was controlled with Imodium. Of note, the patient has frequent hypokalemia and she recently completed a prescription of potassium supplements.  Overall, she tolerated her treatment well.   She denies any recent fever, chills, night sweats, or weight loss. She reports her mild baseline cough for which she takes Robitussin and Tessalon if needed. She is currently in the process of changing around her inhalers with the assistance of her PCP. She denies any chest pain or hemoptysis. She reports her baseline dyspnea on exertion.  She denies any nausea, vomiting, or constipation. She denies any headache or visual changes. The patient is here for 1 week follow-up visit and to manage any adverse side effects of treatment.  MEDICAL HISTORY: Past Medical History:  Diagnosis Date   COPD (chronic obstructive pulmonary disease) ([place])    Depression    Essential hypertension    Headache    History of migraine headaches    lung ca dx'd 10/2018   Sleep apnea    Tobacco use disorder     ALLERGIES:  is allergic to codeine sulfate and pantoprazole sodium.  MEDICATIONS:  Current Outpatient Medications  Medication Sig Dispense Refill   albuterol (VENTOLIN HFA) 108 (90 Base) MCG/ACT inhaler Inhale 2 puffs into the lungs every 6 (six) hours as needed for wheezing or shortness of breath. 36 g 2   amLODipine (NORVASC) 10 MG tablet Take 1 tablet (10 mg total) by mouth daily. 90 tablet 3   atorvastatin (LIPITOR) 20 MG tablet Take 1 tablet (20 mg total) by mouth daily. 90 tablet 3   chlorthalidone (HYGROTON) 50 MG tablet TAKE 1 TABLET EVERY DAY 90 tablet 3   dexamethasone (DECADRON) 4 MG tablet Please take TWO tablets TWICE a day the day before, the day of, and the day after chemotherapy 120 tablet 2   famotidine (PEPCID) 20 MG tablet Take 20 mg by mouth daily.      FLUoxetine (PROZAC) 20 MG capsule TAKE 1 CAPSULE EVERY DAY 90 capsule 3   fluticasone (FLONASE) 50 MCG/ACT nasal spray Place 1 spray into both nostrils daily. 11.1 mL 0   HYDROcodone-acetaminophen (NORCO) 7.5-325 MG tablet Take 1 tablet by mouth every 6 (six) hours as needed for moderate pain. 60 tablet 0  lidocaine-prilocaine (EMLA) cream Apply 1 application topically as needed. 30 g 0   magnesium oxide (MAG-OX) 400 (241.3 Mg) MG tablet Take 1 tablet (400 mg total) by mouth daily. 30 tablet 0   polyvinyl alcohol (LIQUIFILM TEARS) 1.4 % ophthalmic solution Place 1 drop into both eyes as needed for dry eyes.     potassium chloride SA (KLOR-CON M20) 20  MEQ tablet Take 1 tablet (20 mEq total) by mouth daily. 5 tablet 0   tiotropium (SPIRIVA HANDIHALER) 18 MCG inhalation capsule Place 1 capsule (18 mcg total) into inhaler and inhale daily. 30 capsule 2   No current facility-administered medications for this visit.    SURGICAL HISTORY:  Past Surgical History:  Procedure Laterality Date   ABDOMINAL HYSTERECTOMY     CHOLECYSTECTOMY     IR IMAGING GUIDED PORT INSERTION  02/24/2019   ROTATOR CUFF REPAIR  3/04   VIDEO BRONCHOSCOPY WITH ENDOBRONCHIAL ULTRASOUND Right 11/25/2018   Procedure: VIDEO BRONCHOSCOPY WITH ENDOBRONCHIAL ULTRASOUND;  Surgeon: Collene Gobble, MD;  Location: [facility] OR;  Service: Thoracic;  Laterality: Right;    REVIEW OF SYSTEMS:   Review of Systems  Constitutional: Negative for appetite change, chills, fatigue, fever and unexpected weight change.  HENT: Negative for mouth sores, nosebleeds, sore throat and trouble swallowing.   Eyes: Negative for eye problems and icterus.  Respiratory:Positive for baseline shortness of breath on exertion, wheezing, and cough.Negative for hemoptysis. Cardiovascular:Negative forchest pain orleg swelling.  Gastrointestinal: Positive for intermittent diarrhea (controlled). Negative for abdominal pain, constipation, nausea and vomiting.  Genitourinary: Negative for bladder incontinence, difficulty urinating, dysuria, frequency and hematuria.  Musculoskeletal: Mild arthralgias in her right shoulder and back after neulasta injection. Negative for  gait problem, neck pain and neck stiffness.  Skin:Positive for itching and rash on neck bilaterally. Neurological:Negative for dizziness, headaches, extremity weakness, light-headedness and seizures.  Hematological: Negative for adenopathy. Does not bruise/bleed easily.  Psychiatric/Behavioral: Negative for confusion, depression and sleep disturbance. The patient is not nervous/anxious.   PHYSICAL EXAMINATION:  Blood pressure 107/70,  pulse 77, temperature (!) 97.3 F (36.3 C), temperature source Tympanic, resp. rate 18, height 5' 1"  (1.549 m), weight 139 lb 11.2 oz (63.4 kg), SpO2 100 %.  ECOG PERFORMANCE STATUS: 1 - Symptomatic but completely ambulatory  Physical Exam  Constitutional: Oriented to person, place, and time and well-developed, well-nourished, and in no distress.  HENT:  Head: Normocephalic and atraumatic.  Mouth/Throat: Oropharynx is clear and moist. No oropharyngeal exudate.  Eyes: Conjunctivae are normal. Right eye exhibits no discharge. Left eye exhibits no discharge. No scleral icterus.  Neck: Normal range of motion. Neck supple.  Cardiovascular: Normal rate, regular rhythm, normal heart sounds and intact distal pulses.   Pulmonary/Chest: Effort normal and breath sounds normal, mild bilateral wheezing. No respiratory distress. No rales.  Abdominal: Soft. Bowel sounds are normal. Exhibits no distension and no mass. There is no tenderness.  Musculoskeletal: Normal range of motion. Exhibits no edema.  Lymphadenopathy:    No cervical adenopathy.  Neurological: Alert and oriented to person, place, and time. Exhibits normal muscle tone. Gait normal. Coordination normal.  Skin: Skin is warm and dry. No rash noted. Not diaphoretic. No erythema. No pallor.  Psychiatric: Mood, memory and judgment normal.  Vitals reviewed.  LABORATORY DATA: Lab Results  Component Value Date   WBC 38.9 (H) 01/13/2020   HGB 11.0 (L) 01/13/2020   HCT 32.3 (L) 01/13/2020   MCV 93.6 01/13/2020   PLT 173 01/13/2020      Chemistry  Component Value Date/Time   NA 136 01/13/2020 1010   NA 139 07/21/2017 0958   K 3.3 (L) 01/13/2020 1010   CL 98 01/13/2020 1010   CO2 26 01/13/2020 1010   BUN 51 (H) 01/13/2020 1010   BUN 26 07/21/2017 0958   CREATININE 1.29 (H) 01/13/2020 1010   CREATININE 0.79 09/03/2013 1540      Component Value Date/Time   CALCIUM 9.3 01/13/2020 1010   ALKPHOS 137 (H) 01/13/2020 1010   AST 17  01/13/2020 1010   ALT 10 01/13/2020 1010   BILITOT 0.4 01/13/2020 1010       RADIOGRAPHIC STUDIES:  CT Chest W Contrast  Result Date: 12/27/2019 CLINICAL DATA:  Stage IV right lung cancer diagnosed August 2020 with ongoing chemotherapy. Restaging. Patient reports worsening wheezing. EXAM: CT CHEST, ABDOMEN, AND PELVIS WITH CONTRAST TECHNIQUE: Multidetector CT imaging of the chest, abdomen and pelvis was performed following the standard protocol during bolus administration of intravenous contrast. CONTRAST:  126m OMNIPAQUE IOHEXOL 300 MG/ML  SOLN COMPARISON:  10/17/2019 CT chest, abdomen and pelvis. FINDINGS: CT CHEST FINDINGS Cardiovascular: Normal heart size. No significant pericardial effusion/thickening. Left anterior descending coronary atherosclerosis. Right internal jugular Port-A-Cath terminates at the cavoatrial junction. Atherosclerotic thoracic aorta with stable ectatic 4.0 cm ascending thoracic aorta. Stable dilated main pulmonary artery (3.5 cm diameter). No central pulmonary emboli. Mediastinum/Nodes: No discrete thyroid nodules. Unremarkable esophagus. No axillary adenopathy. Mild right paratracheal adenopathy up to the 1.3 cm short axis diameter (series 2/image 23), increased from 0.8 cm. Newly enlarged 1.1 cm right hilar node (series 2/image 27). No left hilar adenopathy. Lungs/Pleura: No pneumothorax. No pleural effusion. Severe centrilobular and paraseptal emphysema with diffuse bronchial wall thickening. No acute consolidative airspace disease or lung masses. Numerous (greater than 15) new solid pulmonary nodules scattered throughout both lungs, largest 0.9 cm in the posterior right upper lobe (series 6/image 57), 0.9 cm in the basilar right lower lobe (series 6/image 106) and 0.7 cm in the superior segment left lower lobe (series 6/image 46). Previously described 1.2 cm solid left lower lobe pulmonary nodule on 10/17/2019 CT is decreased to 0.3 cm (series 6/image 69). Musculoskeletal:  No aggressive appearing focal osseous lesions. Moderate thoracic spondylosis. CT ABDOMEN PELVIS FINDINGS Hepatobiliary: A few scattered small simple liver cysts, largest 1.1 cm in the peripheral right liver, unchanged. No new liver lesions. Normal size liver. Cholecystectomy. Bile ducts are stable and within normal post cholecystectomy limits. Pancreas: Normal, with no mass or duct dilation. Spleen: Normal size. No mass. Adrenals/Urinary Tract: Normal adrenals. Normal kidneys with no hydronephrosis and no renal mass. Normal bladder. Stomach/Bowel: Normal non-distended stomach. Normal caliber small bowel with no small bowel wall thickening. Oral contrast transits to the colon. Appendix not discretely visualized. Mild sigmoid diverticulosis with no large bowel wall thickening or acute pericolonic fat stranding. Vascular/Lymphatic: Atherosclerotic abdominal aorta with 4.0 cm suprarenal abdominal aortic aneurysm, stable. Patent portal, splenic, hepatic and renal veins. No pathologically enlarged lymph nodes in the abdomen or pelvis. Reproductive: Hysterectomy. No right adnexal mass. Cystic 7.3 x 7.3 cm left adnexal mass (series 2/image 90), stable. Other: No pneumoperitoneum, ascites or focal fluid collection. Musculoskeletal: No aggressive appearing focal osseous lesions. Marked lower lumbar spondylosis. IMPRESSION: 1. Findings are suggestive of progressive metastatic disease in the chest. Numerous new solid pulmonary nodules scattered throughout both lungs, largest 0.9 cm, compatible with pulmonary metastases. New mild right hilar and increased mild right paratracheal adenopathy, suspicious for nodal metastases. 2. Previously described 1.2 cm solid left lower  lobe pulmonary nodule is decreased in size and was presumably inflammatory. 3. No evidence of metastatic disease in the abdomen or pelvis. 4. Chronic findings include: Stable ectatic 4.0 cm ascending thoracic aorta. Stable 4.0 cm suprarenal abdominal aortic  aneurysm. Stable dilated main pulmonary artery, suggesting chronic pulmonary arterial hypertension. Mild sigmoid diverticulosis. Aortic Atherosclerosis (ICD10-I70.0) and Emphysema (ICD10-J43.9). Electronically Signed   By: Ilona Sorrel M.D.   On: 12/27/2019 17:20   MR Brain W Wo Contrast  Result Date: 12/28/2019 CLINICAL DATA:  Headache and history of cancer. EXAM: MRI HEAD WITHOUT AND WITH CONTRAST TECHNIQUE: Multiplanar, multiecho pulse sequences of the brain and surrounding structures were obtained without and with intravenous contrast. CONTRAST:  7m GADAVIST GADOBUTROL 1 MMOL/ML IV SOLN COMPARISON:  11/19/2018 FINDINGS: Brain: No acute infarction, hemorrhage, hydrocephalus, or mass lesion. Probable small arachnoid cyst in the left middle cranial fossa measuring 13 mm. No abnormal intracranial enhancement. Age normal brain volume. Mild chronic small vessel ischemia in the cerebral white matter. Vascular: Normal flow voids and vascular enhancement Skull and upper cervical spine: Negative for marrow lesion. C3-4 ACDF. Sinuses/Orbits: Nasopharyngeal retention cysts. No mastoid or middle ear opacification. Other: Intermittent motion degradation. IMPRESSION: No evidence of metastatic disease. No specific explanation for headache. Motion degraded Electronically Signed   By: JMonte FantasiaM.D.   On: 12/28/2019 08:06   CT Abdomen Pelvis W Contrast  Result Date: 12/27/2019 CLINICAL DATA:  Stage IV right lung cancer diagnosed August 2020 with ongoing chemotherapy. Restaging. Patient reports worsening wheezing. EXAM: CT CHEST, ABDOMEN, AND PELVIS WITH CONTRAST TECHNIQUE: Multidetector CT imaging of the chest, abdomen and pelvis was performed following the standard protocol during bolus administration of intravenous contrast. CONTRAST:  1035mOMNIPAQUE IOHEXOL 300 MG/ML  SOLN COMPARISON:  10/17/2019 CT chest, abdomen and pelvis. FINDINGS: CT CHEST FINDINGS Cardiovascular: Normal heart size. No significant  pericardial effusion/thickening. Left anterior descending coronary atherosclerosis. Right internal jugular Port-A-Cath terminates at the cavoatrial junction. Atherosclerotic thoracic aorta with stable ectatic 4.0 cm ascending thoracic aorta. Stable dilated main pulmonary artery (3.5 cm diameter). No central pulmonary emboli. Mediastinum/Nodes: No discrete thyroid nodules. Unremarkable esophagus. No axillary adenopathy. Mild right paratracheal adenopathy up to the 1.3 cm short axis diameter (series 2/image 23), increased from 0.8 cm. Newly enlarged 1.1 cm right hilar node (series 2/image 27). No left hilar adenopathy. Lungs/Pleura: No pneumothorax. No pleural effusion. Severe centrilobular and paraseptal emphysema with diffuse bronchial wall thickening. No acute consolidative airspace disease or lung masses. Numerous (greater than 15) new solid pulmonary nodules scattered throughout both lungs, largest 0.9 cm in the posterior right upper lobe (series 6/image 57), 0.9 cm in the basilar right lower lobe (series 6/image 106) and 0.7 cm in the superior segment left lower lobe (series 6/image 46). Previously described 1.2 cm solid left lower lobe pulmonary nodule on 10/17/2019 CT is decreased to 0.3 cm (series 6/image 69). Musculoskeletal: No aggressive appearing focal osseous lesions. Moderate thoracic spondylosis. CT ABDOMEN PELVIS FINDINGS Hepatobiliary: A few scattered small simple liver cysts, largest 1.1 cm in the peripheral right liver, unchanged. No new liver lesions. Normal size liver. Cholecystectomy. Bile ducts are stable and within normal post cholecystectomy limits. Pancreas: Normal, with no mass or duct dilation. Spleen: Normal size. No mass. Adrenals/Urinary Tract: Normal adrenals. Normal kidneys with no hydronephrosis and no renal mass. Normal bladder. Stomach/Bowel: Normal non-distended stomach. Normal caliber small bowel with no small bowel wall thickening. Oral contrast transits to the colon. Appendix  not discretely visualized. Mild sigmoid diverticulosis with no  large bowel wall thickening or acute pericolonic fat stranding. Vascular/Lymphatic: Atherosclerotic abdominal aorta with 4.0 cm suprarenal abdominal aortic aneurysm, stable. Patent portal, splenic, hepatic and renal veins. No pathologically enlarged lymph nodes in the abdomen or pelvis. Reproductive: Hysterectomy. No right adnexal mass. Cystic 7.3 x 7.3 cm left adnexal mass (series 2/image 90), stable. Other: No pneumoperitoneum, ascites or focal fluid collection. Musculoskeletal: No aggressive appearing focal osseous lesions. Marked lower lumbar spondylosis. IMPRESSION: 1. Findings are suggestive of progressive metastatic disease in the chest. Numerous new solid pulmonary nodules scattered throughout both lungs, largest 0.9 cm, compatible with pulmonary metastases. New mild right hilar and increased mild right paratracheal adenopathy, suspicious for nodal metastases. 2. Previously described 1.2 cm solid left lower lobe pulmonary nodule is decreased in size and was presumably inflammatory. 3. No evidence of metastatic disease in the abdomen or pelvis. 4. Chronic findings include: Stable ectatic 4.0 cm ascending thoracic aorta. Stable 4.0 cm suprarenal abdominal aortic aneurysm. Stable dilated main pulmonary artery, suggesting chronic pulmonary arterial hypertension. Mild sigmoid diverticulosis. Aortic Atherosclerosis (ICD10-I70.0) and Emphysema (ICD10-J43.9). Electronically Signed   By: Ilona Sorrel M.D.   On: 12/27/2019 17:20   MM 3D SCREEN BREAST BILATERAL  Result Date: 12/17/2019 CLINICAL DATA:  Screening. EXAM: DIGITAL SCREENING BILATERAL MAMMOGRAM WITH TOMO AND CAD COMPARISON:  Previous exam(s). ACR Breast Density Category c: The breast tissue is heterogeneously dense, which may obscure small masses. FINDINGS: There are no findings suspicious for malignancy. Images were processed with CAD. IMPRESSION: No mammographic evidence of malignancy. A  result letter of this screening mammogram will be mailed directly to the patient. RECOMMENDATION: Screening mammogram in one year. (Code:[id number]) BI-RADS CATEGORY  1: Negative. Electronically Signed   By: Margarette Canada M.D.   On: 12/17/2019 13:20     ASSESSMENT/PLAN:  This is a very pleasant 74 year old African-American female diagnosed with stage IV non-small cell lung cancer, squamous cell carcinoma. She presented with a right upper lobe lung mass and pleural based metastases and mediastinal lymphadenopathy.She was diagnosed in August 2020   The patient was on systemicchemotherapy with carboplatin for an AUC of 5, paclitaxel 175 mg/m, and Keytruda 200 mg IV every 3 weeks with Neulasta support.Starting from cycle #5, she was on maintenance with single agent Keytruda.She is status post18cyclesof treatment and she tolerated it wellwithout any concerning adverse effectsexcept for mild fatigue.This was discontinued secondary to evidence of disease progression.  The patient is currently undergoing palliative systemic chemotherapy with docetaxel 75 mg per metered squared and Cyramza 10 mg/kg IV every 3 weeks with Neulasta support. She is status post her first cycle and she tolerated it well.    Labs were reviewed. WBCs are 39k today likely secondary to her neulasta injection. No signs or symptoms of infection. Recommend that she continue on the same treatment at the same dose.  The patient was advised to take Claritin for 4 to 10 days after receiving her next Neulasta injection to help with arthralgias and myalgias.   We will see her back for follow-up visit in 2 weeks for evaluation before starting cycle #2.  The patient will continue taking Imodium if needed for diarrhea.  The patient's potassium is improved compared to her other recent lab studies; however it is still mildly low at 3.3.  I will send her a few days worth of potassium supplements 20 mEq for approximately 5 days.  Her  magnesium is also slightly low at 1.6 and I will send a prescription for 400 mg of magnesium oxide  to her pharmacy to take for a couple weeks.  I have also sent a refill of her EMLA cream.  The patient was advised to call immediately if she has any concerning symptoms in the interval. The patient voices understanding of current disease status and treatment options and is in agreement with the current care plan. All questions were answered. The patient knows to call the clinic with any problems, questions or concerns. We can certainly see the patient much sooner if necessary   No orders of the defined types were placed in this encounter.    Ladonna Vanorder L Ellary Casamento, PA-C 01/13/20

## 2020-01-11 ENCOUNTER — Other Ambulatory Visit: Payer: Self-pay | Admitting: Internal Medicine

## 2020-01-11 DIAGNOSIS — E876 Hypokalemia: Secondary | ICD-10-CM

## 2020-01-11 DIAGNOSIS — C3491 Malignant neoplasm of unspecified part of right bronchus or lung: Secondary | ICD-10-CM

## 2020-01-13 ENCOUNTER — Encounter: Payer: Self-pay | Admitting: Physician Assistant

## 2020-01-13 ENCOUNTER — Other Ambulatory Visit: Payer: Self-pay

## 2020-01-13 ENCOUNTER — Inpatient Hospital Stay: Payer: Medicare HMO

## 2020-01-13 ENCOUNTER — Inpatient Hospital Stay (HOSPITAL_BASED_OUTPATIENT_CLINIC_OR_DEPARTMENT_OTHER): Payer: Medicare HMO | Admitting: Physician Assistant

## 2020-01-13 ENCOUNTER — Other Ambulatory Visit: Payer: Self-pay | Admitting: Physician Assistant

## 2020-01-13 DIAGNOSIS — E876 Hypokalemia: Secondary | ICD-10-CM

## 2020-01-13 DIAGNOSIS — Z95828 Presence of other vascular implants and grafts: Secondary | ICD-10-CM

## 2020-01-13 DIAGNOSIS — C3491 Malignant neoplasm of unspecified part of right bronchus or lung: Secondary | ICD-10-CM

## 2020-01-13 DIAGNOSIS — Z5112 Encounter for antineoplastic immunotherapy: Secondary | ICD-10-CM | POA: Diagnosis not present

## 2020-01-13 LAB — CMP (CANCER CENTER ONLY)
ALT: 10 U/L (ref 0–44)
AST: 17 U/L (ref 15–41)
Albumin: 3.6 g/dL (ref 3.5–5.0)
Alkaline Phosphatase: 137 U/L — ABNORMAL HIGH (ref 38–126)
Anion gap: 12 (ref 5–15)
BUN: 51 mg/dL — ABNORMAL HIGH (ref 8–23)
CO2: 26 mmol/L (ref 22–32)
Calcium: 9.3 mg/dL (ref 8.9–10.3)
Chloride: 98 mmol/L (ref 98–111)
Creatinine: 1.29 mg/dL — ABNORMAL HIGH (ref 0.44–1.00)
GFR, Estimated: 44 mL/min — ABNORMAL LOW (ref >60)
Glucose, Bld: 188 mg/dL — ABNORMAL HIGH (ref 70–99)
Potassium: 3.3 mmol/L — ABNORMAL LOW (ref 3.5–5.1)
Sodium: 136 mmol/L (ref 135–145)
Total Bilirubin: 0.4 mg/dL (ref 0.3–1.2)
Total Protein: 6.8 g/dL (ref 6.5–8.1)

## 2020-01-13 LAB — CBC WITH DIFFERENTIAL (CANCER CENTER ONLY)
Abs Immature Granulocytes: 4.6 10*3/uL — ABNORMAL HIGH (ref 0.00–0.07)
Basophils Absolute: 0 10*3/uL (ref 0.0–0.1)
Basophils Relative: 0 %
Eosinophils Absolute: 0 10*3/uL (ref 0.0–0.5)
Eosinophils Relative: 0 %
HCT: 32.3 % — ABNORMAL LOW (ref 36.0–46.0)
Hemoglobin: 11 g/dL — ABNORMAL LOW (ref 12.0–15.0)
Immature Granulocytes: 12 %
Lymphocytes Relative: 8 %
Lymphs Abs: 3.2 10*3/uL (ref 0.7–4.0)
MCH: 31.9 pg (ref 26.0–34.0)
MCHC: 34.1 g/dL (ref 30.0–36.0)
MCV: 93.6 fL (ref 80.0–100.0)
Monocytes Absolute: 1.9 10*3/uL — ABNORMAL HIGH (ref 0.1–1.0)
Monocytes Relative: 5 %
Neutro Abs: 29.3 10*3/uL — ABNORMAL HIGH (ref 1.7–7.7)
Neutrophils Relative %: 75 %
Platelet Count: 173 10*3/uL (ref 150–400)
RBC: 3.45 MIL/uL — ABNORMAL LOW (ref 3.87–5.11)
RDW: 13.9 % (ref 11.5–15.5)
WBC Count: 38.9 10*3/uL — ABNORMAL HIGH (ref 4.0–10.5)
nRBC: 0.3 % — ABNORMAL HIGH (ref 0.0–0.2)

## 2020-01-13 LAB — MAGNESIUM: Magnesium: 1.6 mg/dL — ABNORMAL LOW (ref 1.7–2.4)

## 2020-01-13 MED ORDER — HEPARIN SOD (PORK) LOCK FLUSH 100 UNIT/ML IV SOLN
500.0000 [IU] | Freq: Once | INTRAVENOUS | Status: AC | PRN
  Administered 2020-01-13: 500 [IU]
  Filled 2020-01-13: qty 5

## 2020-01-13 MED ORDER — MAGNESIUM OXIDE 400 (241.3 MG) MG PO TABS: 400.0000 mg | ORAL_TABLET | Freq: Every day | ORAL | 0 refills | Status: DC

## 2020-01-13 MED ORDER — POTASSIUM CHLORIDE CRYS ER 20 MEQ PO TBCR: 20.0000 meq | EXTENDED_RELEASE_TABLET | Freq: Every day | ORAL | 0 refills | Status: DC

## 2020-01-13 MED ORDER — SODIUM CHLORIDE 0.9% FLUSH
10.0000 mL | INTRAVENOUS | Status: DC | PRN
  Administered 2020-01-13: 10 mL
  Filled 2020-01-13: qty 10

## 2020-01-13 MED ORDER — LIDOCAINE-PRILOCAINE 2.5-2.5 % EX CREA: 1.0000 "application " | TOPICAL_CREAM | CUTANEOUS | 0 refills | Status: DC | PRN

## 2020-01-13 NOTE — Patient Instructions (Signed)

## 2020-01-19 ENCOUNTER — Ambulatory Visit: Payer: Medicare HMO

## 2020-01-19 ENCOUNTER — Other Ambulatory Visit: Payer: Medicare HMO

## 2020-01-19 ENCOUNTER — Ambulatory Visit: Payer: Medicare HMO | Admitting: Internal Medicine

## 2020-01-20 ENCOUNTER — Inpatient Hospital Stay: Payer: Medicare HMO

## 2020-01-24 ENCOUNTER — Other Ambulatory Visit: Payer: Self-pay | Admitting: Physician Assistant

## 2020-01-26 ENCOUNTER — Inpatient Hospital Stay: Payer: Medicare HMO | Attending: Physician Assistant | Admitting: Internal Medicine

## 2020-01-26 ENCOUNTER — Other Ambulatory Visit: Payer: Self-pay

## 2020-01-26 ENCOUNTER — Encounter: Payer: Self-pay | Admitting: Internal Medicine

## 2020-01-26 ENCOUNTER — Inpatient Hospital Stay: Payer: Medicare HMO

## 2020-01-26 VITALS — BP 126/76 | HR 79 | Temp 97.6°F | Resp 18 | Ht 61.0 in | Wt 140.4 lb

## 2020-01-26 DIAGNOSIS — Z5111 Encounter for antineoplastic chemotherapy: Secondary | ICD-10-CM | POA: Diagnosis not present

## 2020-01-26 DIAGNOSIS — Z5112 Encounter for antineoplastic immunotherapy: Secondary | ICD-10-CM | POA: Insufficient documentation

## 2020-01-26 DIAGNOSIS — C3411 Malignant neoplasm of upper lobe, right bronchus or lung: Secondary | ICD-10-CM | POA: Diagnosis not present

## 2020-01-26 DIAGNOSIS — Z79899 Other long term (current) drug therapy: Secondary | ICD-10-CM | POA: Insufficient documentation

## 2020-01-26 DIAGNOSIS — C782 Secondary malignant neoplasm of pleura: Secondary | ICD-10-CM | POA: Diagnosis not present

## 2020-01-26 DIAGNOSIS — I10 Essential (primary) hypertension: Secondary | ICD-10-CM

## 2020-01-26 DIAGNOSIS — Z7901 Long term (current) use of anticoagulants: Secondary | ICD-10-CM | POA: Insufficient documentation

## 2020-01-26 DIAGNOSIS — C3491 Malignant neoplasm of unspecified part of right bronchus or lung: Secondary | ICD-10-CM | POA: Diagnosis not present

## 2020-01-26 DIAGNOSIS — F329 Major depressive disorder, single episode, unspecified: Secondary | ICD-10-CM | POA: Diagnosis not present

## 2020-01-26 DIAGNOSIS — J449 Chronic obstructive pulmonary disease, unspecified: Secondary | ICD-10-CM | POA: Diagnosis not present

## 2020-01-26 DIAGNOSIS — F17210 Nicotine dependence, cigarettes, uncomplicated: Secondary | ICD-10-CM | POA: Insufficient documentation

## 2020-01-26 DIAGNOSIS — G4730 Sleep apnea, unspecified: Secondary | ICD-10-CM | POA: Insufficient documentation

## 2020-01-26 DIAGNOSIS — Z86718 Personal history of other venous thrombosis and embolism: Secondary | ICD-10-CM | POA: Insufficient documentation

## 2020-01-26 LAB — CBC WITH DIFFERENTIAL (CANCER CENTER ONLY)
Abs Immature Granulocytes: 0.07 10*3/uL (ref 0.00–0.07)
Basophils Absolute: 0 10*3/uL (ref 0.0–0.1)
Basophils Relative: 0 %
Eosinophils Absolute: 0 10*3/uL (ref 0.0–0.5)
Eosinophils Relative: 0 %
HCT: 34 % — ABNORMAL LOW (ref 36.0–46.0)
Hemoglobin: 11.4 g/dL — ABNORMAL LOW (ref 12.0–15.0)
Immature Granulocytes: 1 %
Lymphocytes Relative: 11 %
Lymphs Abs: 1.1 10*3/uL (ref 0.7–4.0)
MCH: 31.8 pg (ref 26.0–34.0)
MCHC: 33.5 g/dL (ref 30.0–36.0)
MCV: 94.7 fL (ref 80.0–100.0)
Monocytes Absolute: 0.5 10*3/uL (ref 0.1–1.0)
Monocytes Relative: 4 %
Neutro Abs: 8.9 10*3/uL — ABNORMAL HIGH (ref 1.7–7.7)
Neutrophils Relative %: 84 %
Platelet Count: 322 10*3/uL (ref 150–400)
RBC: 3.59 MIL/uL — ABNORMAL LOW (ref 3.87–5.11)
RDW: 14.9 % (ref 11.5–15.5)
WBC Count: 10.6 10*3/uL — ABNORMAL HIGH (ref 4.0–10.5)
nRBC: 0 % (ref 0.0–0.2)

## 2020-01-26 LAB — CMP (CANCER CENTER ONLY)
ALT: 12 U/L (ref 0–44)
AST: 12 U/L — ABNORMAL LOW (ref 15–41)
Albumin: 3.9 g/dL (ref 3.5–5.0)
Alkaline Phosphatase: 72 U/L (ref 38–126)
Anion gap: 10 (ref 5–15)
BUN: 31 mg/dL — ABNORMAL HIGH (ref 8–23)
CO2: 26 mmol/L (ref 22–32)
Calcium: 9.6 mg/dL (ref 8.9–10.3)
Chloride: 102 mmol/L (ref 98–111)
Creatinine: 1.06 mg/dL — ABNORMAL HIGH (ref 0.44–1.00)
GFR, Estimated: 56 mL/min — ABNORMAL LOW (ref >60)
Glucose, Bld: 146 mg/dL — ABNORMAL HIGH (ref 70–99)
Potassium: 3.5 mmol/L (ref 3.5–5.1)
Sodium: 138 mmol/L (ref 135–145)
Total Bilirubin: 0.6 mg/dL (ref 0.3–1.2)
Total Protein: 7.4 g/dL (ref 6.5–8.1)

## 2020-01-26 LAB — TOTAL PROTEIN, URINE DIPSTICK: Protein, ur: NEGATIVE mg/dL

## 2020-01-26 MED ORDER — HEPARIN SOD (PORK) LOCK FLUSH 100 UNIT/ML IV SOLN
500.0000 [IU] | Freq: Once | INTRAVENOUS | Status: AC | PRN
  Administered 2020-01-26: 500 [IU]
  Filled 2020-01-26: qty 5

## 2020-01-26 MED ORDER — DIPHENHYDRAMINE HCL 50 MG/ML IJ SOLN
INTRAMUSCULAR | Status: AC
  Filled 2020-01-26: qty 1

## 2020-01-26 MED ORDER — DIPHENHYDRAMINE HCL 50 MG/ML IJ SOLN
50.0000 mg | Freq: Once | INTRAMUSCULAR | Status: AC
  Administered 2020-01-26: 50 mg via INTRAVENOUS

## 2020-01-26 MED ORDER — SODIUM CHLORIDE 0.9% FLUSH
10.0000 mL | INTRAVENOUS | Status: DC | PRN
  Administered 2020-01-26: 10 mL
  Filled 2020-01-26: qty 10

## 2020-01-26 MED ORDER — ACETAMINOPHEN 325 MG PO TABS
ORAL_TABLET | ORAL | Status: AC
  Filled 2020-01-26: qty 2

## 2020-01-26 MED ORDER — SODIUM CHLORIDE 0.9 % IV SOLN
75.0000 mg/m2 | Freq: Once | INTRAVENOUS | Status: AC
  Administered 2020-01-26: 120 mg via INTRAVENOUS
  Filled 2020-01-26: qty 12

## 2020-01-26 MED ORDER — ACETAMINOPHEN 325 MG PO TABS
650.0000 mg | ORAL_TABLET | Freq: Once | ORAL | Status: AC
  Administered 2020-01-26: 650 mg via ORAL

## 2020-01-26 MED ORDER — SODIUM CHLORIDE 0.9 % IV SOLN
10.0000 mg/kg | Freq: Once | INTRAVENOUS | Status: AC
  Administered 2020-01-26: 600 mg via INTRAVENOUS
  Filled 2020-01-26: qty 50

## 2020-01-26 MED ORDER — SODIUM CHLORIDE 0.9 % IV SOLN
10.0000 mg | Freq: Once | INTRAVENOUS | Status: AC
  Administered 2020-01-26: 10 mg via INTRAVENOUS
  Filled 2020-01-26: qty 10

## 2020-01-26 MED ORDER — SODIUM CHLORIDE 0.9 % IV SOLN
Freq: Once | INTRAVENOUS | Status: AC
  Filled 2020-01-26: qty 250

## 2020-01-26 NOTE — Patient Instructions (Signed)
[facility] Discharge Instructions for Patients Receiving Chemotherapy  Today you received the following chemotherapy agents cyramza, taxotere.  To help prevent nausea and vomiting after your treatment, we encourage you to take your nausea medication as directed.    If you develop nausea and vomiting that is not controlled by your nausea medication, call the clinic.   BELOW ARE SYMPTOMS THAT SHOULD BE REPORTED IMMEDIATELY:  *FEVER GREATER THAN 100.5 F  *CHILLS WITH OR WITHOUT FEVER  NAUSEA AND VOMITING THAT IS NOT CONTROLLED WITH YOUR NAUSEA MEDICATION  *UNUSUAL SHORTNESS OF BREATH  *UNUSUAL BRUISING OR BLEEDING  TENDERNESS IN MOUTH AND THROAT WITH OR WITHOUT PRESENCE OF ULCERS  *URINARY PROBLEMS  *BOWEL PROBLEMS  UNUSUAL RASH Items with * indicate a potential emergency and should be followed up as soon as possible.  Feel free to call the clinic should you have any questions or concerns. The clinic phone number is [phone].  Please show the [facility] at check-in to the Emergency Department and triage nurse.

## 2020-01-26 NOTE — Progress Notes (Signed)
[facility] [phone]   [phone]  OFFICE PROGRESS NOTE  [name], MD [place] [address]  DIAGNOSIS: Stage IV non-small cell lung cancer, squamous cell carcinoma. She presented withright upper lobe lung mass in addition to pleural-based metastasis andmediastinal lymphadenopathy.She was diagnosed in September 2020.  PRIOR THERAPY: Chemotherapy with carboplatin for an AUC of 5,paclitaxel 175 mg/m, and Keytruda 200 mg IV every 3 weeks with Neulasta support.Last dose9/15/21.Status post18cycles. Starting from cycle #5 was on maintenance treatment with single agent Keytruda every 3 weeks.This was discontinued due to evidence of disease progression.   CURRENT THERAPY: Palliative systemic chemotherapy with docetaxel 75 mg per metered squared and Cyramza 10 mg/kg IV every 3 weeks with Neulasta support. First dose on 01/04/20.  Status post 1 cycle.  INTERVAL HISTORY: Debra Rodgers 74 y.o. female returns to the clinic today for follow-up visit.  The patient is feeling fine today with no concerning complaints.  She tolerated the first cycle of her systemic chemotherapy with docetaxel and Cyramza fairly well.  She denied having any chest pain, shortness of breath, cough or hemoptysis.  She denied having any fever or chills.  She has no nausea, vomiting, diarrhea or constipation.  She had intermittent left flank pain and she passed a kidney stone recently.  The patient is here today for evaluation before starting cycle #2.  MEDICAL HISTORY: Past Medical History:  Diagnosis Date  . COPD (chronic obstructive pulmonary disease) ([place])   . Depression   . Essential hypertension   . Headache   . History of migraine headaches   . lung ca dx'd 10/2018  . Sleep apnea   . Tobacco use disorder     ALLERGIES:  is allergic to codeine sulfate and pantoprazole sodium.  MEDICATIONS:  Current Outpatient Medications   Medication Sig Dispense Refill  . albuterol (VENTOLIN HFA) 108 (90 Base) MCG/ACT inhaler Inhale 2 puffs into the lungs every 6 (six) hours as needed for wheezing or shortness of breath. 36 g 2  . amLODipine (NORVASC) 10 MG tablet Take 1 tablet (10 mg total) by mouth daily. 90 tablet 3  . atorvastatin (LIPITOR) 20 MG tablet Take 1 tablet (20 mg total) by mouth daily. 90 tablet 3  . benzonatate (TESSALON) 100 MG capsule TAKE 1 CAPSULE EVERY 8 HOURS AS NEEDED FOR COUGH 90 capsule 0  . chlorthalidone (HYGROTON) 50 MG tablet TAKE 1 TABLET EVERY DAY 90 tablet 3  . dexamethasone (DECADRON) 4 MG tablet Please take TWO tablets TWICE a day the day before, the day of, and the day after chemotherapy 120 tablet 2  . famotidine (PEPCID) 20 MG tablet Take 20 mg by mouth daily.     Marland Kitchen FLUoxetine (PROZAC) 20 MG capsule TAKE 1 CAPSULE EVERY DAY 90 capsule 3  . HYDROcodone-acetaminophen (NORCO) 7.5-325 MG tablet Take 1 tablet by mouth every 6 (six) hours as needed for moderate pain. 60 tablet 0  . lidocaine-prilocaine (EMLA) cream Apply 1 application topically as needed. 30 g 0  . magnesium oxide (MAG-OX) 400 (241.3 Mg) MG tablet Take 1 tablet (400 mg total) by mouth daily. 30 tablet 0  . polyvinyl alcohol (LIQUIFILM TEARS) 1.4 % ophthalmic solution Place 1 drop into both eyes as needed for dry eyes.    . potassium chloride SA (KLOR-CON M20) 20 MEQ tablet Take 1 tablet (20 mEq total) by mouth daily. 5 tablet 0  . tiotropium (SPIRIVA HANDIHALER) 18 MCG inhalation  capsule Place 1 capsule (18 mcg total) into inhaler and inhale daily. 30 capsule 2  . fluticasone (FLONASE) 50 MCG/ACT nasal spray Place 1 spray into both nostrils daily. 11.1 mL 0   No current facility-administered medications for this visit.    SURGICAL HISTORY:  Past Surgical History:  Procedure Laterality Date  . ABDOMINAL HYSTERECTOMY    . CHOLECYSTECTOMY    . IR IMAGING GUIDED PORT INSERTION  02/24/2019  . ROTATOR CUFF REPAIR  3/04  . VIDEO  BRONCHOSCOPY WITH ENDOBRONCHIAL ULTRASOUND Right 11/25/2018   Procedure: VIDEO BRONCHOSCOPY WITH ENDOBRONCHIAL ULTRASOUND;  Surgeon: Collene Gobble, MD;  Location: [facility] OR;  Service: Thoracic;  Laterality: Right;    REVIEW OF SYSTEMS:  A comprehensive review of systems was negative except for: Constitutional: positive for fatigue   PHYSICAL EXAMINATION: General appearance: alert, cooperative, fatigued and no distress Head: Normocephalic, without obvious abnormality, atraumatic Neck: no adenopathy, no JVD, supple, symmetrical, trachea midline and thyroid not enlarged, symmetric, no tenderness/mass/nodules Lymph nodes: Cervical, supraclavicular, and axillary nodes normal. Resp: clear to auscultation bilaterally Back: symmetric, no curvature. ROM normal. No CVA tenderness. Cardio: regular rate and rhythm, S1, S2 normal, no murmur, click, rub or gallop GI: soft, non-tender; bowel sounds normal; no masses,  no organomegaly Extremities: extremities normal, atraumatic, no cyanosis or edema  ECOG PERFORMANCE STATUS: 1 - Symptomatic but completely ambulatory  Blood pressure 126/76, pulse 79, temperature 97.6 F (36.4 C), temperature source Tympanic, resp. rate 18, height 5' 1"  (1.549 m), weight 140 lb 6.4 oz (63.7 kg), SpO2 100 %.  LABORATORY DATA: Lab Results  Component Value Date   WBC 10.6 (H) 01/26/2020   HGB 11.4 (L) 01/26/2020   HCT 34.0 (L) 01/26/2020   MCV 94.7 01/26/2020   PLT 322 01/26/2020      Chemistry      Component Value Date/Time   NA 136 01/13/2020 1010   NA 139 07/21/2017 0958   K 3.3 (L) 01/13/2020 1010   CL 98 01/13/2020 1010   CO2 26 01/13/2020 1010   BUN 51 (H) 01/13/2020 1010   BUN 26 07/21/2017 0958   CREATININE 1.29 (H) 01/13/2020 1010   CREATININE 0.79 09/03/2013 1540      Component Value Date/Time   CALCIUM 9.3 01/13/2020 1010   ALKPHOS 137 (H) 01/13/2020 1010   AST 17 01/13/2020 1010   ALT 10 01/13/2020 1010   BILITOT 0.4 01/13/2020 1010        RADIOGRAPHIC STUDIES: CT Chest W Contrast  Result Date: 12/27/2019 CLINICAL DATA:  Stage IV right lung cancer diagnosed August 2020 with ongoing chemotherapy. Restaging. Patient reports worsening wheezing. EXAM: CT CHEST, ABDOMEN, AND PELVIS WITH CONTRAST TECHNIQUE: Multidetector CT imaging of the chest, abdomen and pelvis was performed following the standard protocol during bolus administration of intravenous contrast. CONTRAST:  150m OMNIPAQUE IOHEXOL 300 MG/ML  SOLN COMPARISON:  10/17/2019 CT chest, abdomen and pelvis. FINDINGS: CT CHEST FINDINGS Cardiovascular: Normal heart size. No significant pericardial effusion/thickening. Left anterior descending coronary atherosclerosis. Right internal jugular Port-A-Cath terminates at the cavoatrial junction. Atherosclerotic thoracic aorta with stable ectatic 4.0 cm ascending thoracic aorta. Stable dilated main pulmonary artery (3.5 cm diameter). No central pulmonary emboli. Mediastinum/Nodes: No discrete thyroid nodules. Unremarkable esophagus. No axillary adenopathy. Mild right paratracheal adenopathy up to the 1.3 cm short axis diameter (series 2/image 23), increased from 0.8 cm. Newly enlarged 1.1 cm right hilar node (series 2/image 27). No left hilar adenopathy. Lungs/Pleura: No pneumothorax. No pleural effusion. Severe centrilobular and paraseptal emphysema  with diffuse bronchial wall thickening. No acute consolidative airspace disease or lung masses. Numerous (greater than 15) new solid pulmonary nodules scattered throughout both lungs, largest 0.9 cm in the posterior right upper lobe (series 6/image 57), 0.9 cm in the basilar right lower lobe (series 6/image 106) and 0.7 cm in the superior segment left lower lobe (series 6/image 46). Previously described 1.2 cm solid left lower lobe pulmonary nodule on 10/17/2019 CT is decreased to 0.3 cm (series 6/image 69). Musculoskeletal: No aggressive appearing focal osseous lesions. Moderate thoracic spondylosis.  CT ABDOMEN PELVIS FINDINGS Hepatobiliary: A few scattered small simple liver cysts, largest 1.1 cm in the peripheral right liver, unchanged. No new liver lesions. Normal size liver. Cholecystectomy. Bile ducts are stable and within normal post cholecystectomy limits. Pancreas: Normal, with no mass or duct dilation. Spleen: Normal size. No mass. Adrenals/Urinary Tract: Normal adrenals. Normal kidneys with no hydronephrosis and no renal mass. Normal bladder. Stomach/Bowel: Normal non-distended stomach. Normal caliber small bowel with no small bowel wall thickening. Oral contrast transits to the colon. Appendix not discretely visualized. Mild sigmoid diverticulosis with no large bowel wall thickening or acute pericolonic fat stranding. Vascular/Lymphatic: Atherosclerotic abdominal aorta with 4.0 cm suprarenal abdominal aortic aneurysm, stable. Patent portal, splenic, hepatic and renal veins. No pathologically enlarged lymph nodes in the abdomen or pelvis. Reproductive: Hysterectomy. No right adnexal mass. Cystic 7.3 x 7.3 cm left adnexal mass (series 2/image 90), stable. Other: No pneumoperitoneum, ascites or focal fluid collection. Musculoskeletal: No aggressive appearing focal osseous lesions. Marked lower lumbar spondylosis. IMPRESSION: 1. Findings are suggestive of progressive metastatic disease in the chest. Numerous new solid pulmonary nodules scattered throughout both lungs, largest 0.9 cm, compatible with pulmonary metastases. New mild right hilar and increased mild right paratracheal adenopathy, suspicious for nodal metastases. 2. Previously described 1.2 cm solid left lower lobe pulmonary nodule is decreased in size and was presumably inflammatory. 3. No evidence of metastatic disease in the abdomen or pelvis. 4. Chronic findings include: Stable ectatic 4.0 cm ascending thoracic aorta. Stable 4.0 cm suprarenal abdominal aortic aneurysm. Stable dilated main pulmonary artery, suggesting chronic pulmonary  arterial hypertension. Mild sigmoid diverticulosis. Aortic Atherosclerosis (ICD10-I70.0) and Emphysema (ICD10-J43.9). Electronically Signed   By: Ilona Sorrel M.D.   On: 12/27/2019 17:20   MR Brain W Wo Contrast  Result Date: 12/28/2019 CLINICAL DATA:  Headache and history of cancer. EXAM: MRI HEAD WITHOUT AND WITH CONTRAST TECHNIQUE: Multiplanar, multiecho pulse sequences of the brain and surrounding structures were obtained without and with intravenous contrast. CONTRAST:  90m GADAVIST GADOBUTROL 1 MMOL/ML IV SOLN COMPARISON:  11/19/2018 FINDINGS: Brain: No acute infarction, hemorrhage, hydrocephalus, or mass lesion. Probable small arachnoid cyst in the left middle cranial fossa measuring 13 mm. No abnormal intracranial enhancement. Age normal brain volume. Mild chronic small vessel ischemia in the cerebral white matter. Vascular: Normal flow voids and vascular enhancement Skull and upper cervical spine: Negative for marrow lesion. C3-4 ACDF. Sinuses/Orbits: Nasopharyngeal retention cysts. No mastoid or middle ear opacification. Other: Intermittent motion degradation. IMPRESSION: No evidence of metastatic disease. No specific explanation for headache. Motion degraded Electronically Signed   By: JMonte FantasiaM.D.   On: 12/28/2019 08:06   CT Abdomen Pelvis W Contrast  Result Date: 12/27/2019 CLINICAL DATA:  Stage IV right lung cancer diagnosed August 2020 with ongoing chemotherapy. Restaging. Patient reports worsening wheezing. EXAM: CT CHEST, ABDOMEN, AND PELVIS WITH CONTRAST TECHNIQUE: Multidetector CT imaging of the chest, abdomen and pelvis was performed following the standard protocol during bolus  administration of intravenous contrast. CONTRAST:  174m OMNIPAQUE IOHEXOL 300 MG/ML  SOLN COMPARISON:  10/17/2019 CT chest, abdomen and pelvis. FINDINGS: CT CHEST FINDINGS Cardiovascular: Normal heart size. No significant pericardial effusion/thickening. Left anterior descending coronary atherosclerosis.  Right internal jugular Port-A-Cath terminates at the cavoatrial junction. Atherosclerotic thoracic aorta with stable ectatic 4.0 cm ascending thoracic aorta. Stable dilated main pulmonary artery (3.5 cm diameter). No central pulmonary emboli. Mediastinum/Nodes: No discrete thyroid nodules. Unremarkable esophagus. No axillary adenopathy. Mild right paratracheal adenopathy up to the 1.3 cm short axis diameter (series 2/image 23), increased from 0.8 cm. Newly enlarged 1.1 cm right hilar node (series 2/image 27). No left hilar adenopathy. Lungs/Pleura: No pneumothorax. No pleural effusion. Severe centrilobular and paraseptal emphysema with diffuse bronchial wall thickening. No acute consolidative airspace disease or lung masses. Numerous (greater than 15) new solid pulmonary nodules scattered throughout both lungs, largest 0.9 cm in the posterior right upper lobe (series 6/image 57), 0.9 cm in the basilar right lower lobe (series 6/image 106) and 0.7 cm in the superior segment left lower lobe (series 6/image 46). Previously described 1.2 cm solid left lower lobe pulmonary nodule on 10/17/2019 CT is decreased to 0.3 cm (series 6/image 69). Musculoskeletal: No aggressive appearing focal osseous lesions. Moderate thoracic spondylosis. CT ABDOMEN PELVIS FINDINGS Hepatobiliary: A few scattered small simple liver cysts, largest 1.1 cm in the peripheral right liver, unchanged. No new liver lesions. Normal size liver. Cholecystectomy. Bile ducts are stable and within normal post cholecystectomy limits. Pancreas: Normal, with no mass or duct dilation. Spleen: Normal size. No mass. Adrenals/Urinary Tract: Normal adrenals. Normal kidneys with no hydronephrosis and no renal mass. Normal bladder. Stomach/Bowel: Normal non-distended stomach. Normal caliber small bowel with no small bowel wall thickening. Oral contrast transits to the colon. Appendix not discretely visualized. Mild sigmoid diverticulosis with no large bowel wall  thickening or acute pericolonic fat stranding. Vascular/Lymphatic: Atherosclerotic abdominal aorta with 4.0 cm suprarenal abdominal aortic aneurysm, stable. Patent portal, splenic, hepatic and renal veins. No pathologically enlarged lymph nodes in the abdomen or pelvis. Reproductive: Hysterectomy. No right adnexal mass. Cystic 7.3 x 7.3 cm left adnexal mass (series 2/image 90), stable. Other: No pneumoperitoneum, ascites or focal fluid collection. Musculoskeletal: No aggressive appearing focal osseous lesions. Marked lower lumbar spondylosis. IMPRESSION: 1. Findings are suggestive of progressive metastatic disease in the chest. Numerous new solid pulmonary nodules scattered throughout both lungs, largest 0.9 cm, compatible with pulmonary metastases. New mild right hilar and increased mild right paratracheal adenopathy, suspicious for nodal metastases. 2. Previously described 1.2 cm solid left lower lobe pulmonary nodule is decreased in size and was presumably inflammatory. 3. No evidence of metastatic disease in the abdomen or pelvis. 4. Chronic findings include: Stable ectatic 4.0 cm ascending thoracic aorta. Stable 4.0 cm suprarenal abdominal aortic aneurysm. Stable dilated main pulmonary artery, suggesting chronic pulmonary arterial hypertension. Mild sigmoid diverticulosis. Aortic Atherosclerosis (ICD10-I70.0) and Emphysema (ICD10-J43.9). Electronically Signed   By: JIlona SorrelM.D.   On: 12/27/2019 17:20    ASSESSMENT AND PLAN: This is a very pleasant 74years old African-American female with stage IV non-small cell carcinoma,, squamous cell carcinoma diagnosed in September 2020.  She presented with extensive right-sided pleural and thoracic nodal hypermetabolic disease with no extrathoracic disease. The patient started induction treatment with systemic chemotherapy with carboplatin, paclitaxel and Keytruda status post 4 cycles with partial response after cycle #4.  This was followed by 14 cycles of  maintenance treatment with single agent Keytruda discontinued secondary to disease progression.  The patient is currently undergoing second line systemic chemotherapy with docetaxel 75 mg/M2 and Cyramza 10 mg/KG every 3 weeks with Neulasta support.  Status post 1 cycle. She tolerated the first cycle of her treatment well. I recommended for her to proceed with cycle #2 today as planned. For the history of deep venous thrombosis, I will give her a refill of Xarelto. She will come back for follow-up visit in 3 weeks for evaluation before the next cycle of her treatment. The patient was advised to call immediately if she has any concerning symptoms in the interval. The patient voices understanding of current disease status and treatment options and is in agreement with the current care plan. All questions were answered. The patient knows to call the clinic with any problems, questions or concerns. We can certainly see the patient much sooner if necessary.  Disclaimer: This note was dictated with voice recognition software. Similar sounding words can inadvertently be transcribed and may not be corrected upon review.

## 2020-01-28 ENCOUNTER — Inpatient Hospital Stay: Payer: Medicare HMO

## 2020-01-28 ENCOUNTER — Other Ambulatory Visit: Payer: Self-pay

## 2020-01-28 VITALS — BP 116/80 | HR 71 | Resp 18

## 2020-01-28 DIAGNOSIS — C3491 Malignant neoplasm of unspecified part of right bronchus or lung: Secondary | ICD-10-CM

## 2020-01-28 DIAGNOSIS — Z5112 Encounter for antineoplastic immunotherapy: Secondary | ICD-10-CM | POA: Diagnosis not present

## 2020-01-28 MED ORDER — PEGFILGRASTIM-JMDB 6 MG/0.6ML ~~LOC~~ SOSY
6.0000 mg | PREFILLED_SYRINGE | Freq: Once | SUBCUTANEOUS | Status: AC
  Administered 2020-01-28: 6 mg via SUBCUTANEOUS

## 2020-01-28 MED ORDER — PEGFILGRASTIM-JMDB 6 MG/0.6ML ~~LOC~~ SOSY
PREFILLED_SYRINGE | SUBCUTANEOUS | Status: AC
  Filled 2020-01-28: qty 0.6

## 2020-01-28 NOTE — Patient Instructions (Signed)

## 2020-02-02 ENCOUNTER — Other Ambulatory Visit: Payer: Self-pay

## 2020-02-02 ENCOUNTER — Inpatient Hospital Stay: Payer: Medicare HMO

## 2020-02-02 DIAGNOSIS — Z95828 Presence of other vascular implants and grafts: Secondary | ICD-10-CM

## 2020-02-02 DIAGNOSIS — C3491 Malignant neoplasm of unspecified part of right bronchus or lung: Secondary | ICD-10-CM

## 2020-02-02 DIAGNOSIS — Z5112 Encounter for antineoplastic immunotherapy: Secondary | ICD-10-CM | POA: Diagnosis not present

## 2020-02-02 LAB — CBC WITH DIFFERENTIAL (CANCER CENTER ONLY)
Abs Immature Granulocytes: 0.23 10*3/uL — ABNORMAL HIGH (ref 0.00–0.07)
Basophils Absolute: 0 10*3/uL (ref 0.0–0.1)
Basophils Relative: 1 %
Eosinophils Absolute: 0.1 10*3/uL (ref 0.0–0.5)
Eosinophils Relative: 2 %
HCT: 33.5 % — ABNORMAL LOW (ref 36.0–46.0)
Hemoglobin: 11.1 g/dL — ABNORMAL LOW (ref 12.0–15.0)
Immature Granulocytes: 5 %
Lymphocytes Relative: 36 %
Lymphs Abs: 1.8 10*3/uL (ref 0.7–4.0)
MCH: 31.5 pg (ref 26.0–34.0)
MCHC: 33.1 g/dL (ref 30.0–36.0)
MCV: 95.2 fL (ref 80.0–100.0)
Monocytes Absolute: 1 10*3/uL (ref 0.1–1.0)
Monocytes Relative: 20 %
Neutro Abs: 1.7 10*3/uL (ref 1.7–7.7)
Neutrophils Relative %: 36 %
Platelet Count: 133 10*3/uL — ABNORMAL LOW (ref 150–400)
RBC: 3.52 MIL/uL — ABNORMAL LOW (ref 3.87–5.11)
RDW: 14.8 % (ref 11.5–15.5)
WBC Count: 4.8 10*3/uL (ref 4.0–10.5)
nRBC: 0 % (ref 0.0–0.2)

## 2020-02-02 LAB — CMP (CANCER CENTER ONLY)
ALT: 8 U/L (ref 0–44)
AST: 12 U/L — ABNORMAL LOW (ref 15–41)
Albumin: 3.5 g/dL (ref 3.5–5.0)
Alkaline Phosphatase: 69 U/L (ref 38–126)
Anion gap: 8 (ref 5–15)
BUN: 22 mg/dL (ref 8–23)
CO2: 30 mmol/L (ref 22–32)
Calcium: 9.5 mg/dL (ref 8.9–10.3)
Chloride: 99 mmol/L (ref 98–111)
Creatinine: 1.01 mg/dL — ABNORMAL HIGH (ref 0.44–1.00)
GFR, Estimated: 59 mL/min — ABNORMAL LOW (ref >60)
Glucose, Bld: 140 mg/dL — ABNORMAL HIGH (ref 70–99)
Potassium: 3.6 mmol/L (ref 3.5–5.1)
Sodium: 137 mmol/L (ref 135–145)
Total Bilirubin: 1.1 mg/dL (ref 0.3–1.2)
Total Protein: 6.7 g/dL (ref 6.5–8.1)

## 2020-02-02 MED ORDER — SODIUM CHLORIDE 0.9% FLUSH
10.0000 mL | INTRAVENOUS | Status: DC | PRN
  Administered 2020-02-02: 10 mL
  Filled 2020-02-02: qty 10

## 2020-02-02 MED ORDER — HEPARIN SOD (PORK) LOCK FLUSH 100 UNIT/ML IV SOLN
500.0000 [IU] | Freq: Once | INTRAVENOUS | Status: AC | PRN
  Administered 2020-02-02: 500 [IU]
  Filled 2020-02-02: qty 5

## 2020-02-03 NOTE — Telephone Encounter (Signed)
Review meds

## 2020-02-04 ENCOUNTER — Other Ambulatory Visit: Payer: Self-pay | Admitting: Physician Assistant

## 2020-02-09 ENCOUNTER — Inpatient Hospital Stay: Payer: Medicare HMO

## 2020-02-09 ENCOUNTER — Other Ambulatory Visit: Payer: Self-pay

## 2020-02-09 DIAGNOSIS — Z95828 Presence of other vascular implants and grafts: Secondary | ICD-10-CM

## 2020-02-09 DIAGNOSIS — Z5112 Encounter for antineoplastic immunotherapy: Secondary | ICD-10-CM | POA: Diagnosis not present

## 2020-02-09 DIAGNOSIS — C3491 Malignant neoplasm of unspecified part of right bronchus or lung: Secondary | ICD-10-CM

## 2020-02-09 LAB — CBC WITH DIFFERENTIAL (CANCER CENTER ONLY)
Abs Immature Granulocytes: 0.24 10*3/uL — ABNORMAL HIGH (ref 0.00–0.07)
Basophils Absolute: 0.1 10*3/uL (ref 0.0–0.1)
Basophils Relative: 0 %
Eosinophils Absolute: 0 10*3/uL (ref 0.0–0.5)
Eosinophils Relative: 0 %
HCT: 32.7 % — ABNORMAL LOW (ref 36.0–46.0)
Hemoglobin: 10.9 g/dL — ABNORMAL LOW (ref 12.0–15.0)
Immature Granulocytes: 2 %
Lymphocytes Relative: 15 %
Lymphs Abs: 1.9 10*3/uL (ref 0.7–4.0)
MCH: 32.2 pg (ref 26.0–34.0)
MCHC: 33.3 g/dL (ref 30.0–36.0)
MCV: 96.7 fL (ref 80.0–100.0)
Monocytes Absolute: 0.6 10*3/uL (ref 0.1–1.0)
Monocytes Relative: 5 %
Neutro Abs: 9.4 10*3/uL — ABNORMAL HIGH (ref 1.7–7.7)
Neutrophils Relative %: 78 %
Platelet Count: 166 10*3/uL (ref 150–400)
RBC: 3.38 MIL/uL — ABNORMAL LOW (ref 3.87–5.11)
RDW: 16.1 % — ABNORMAL HIGH (ref 11.5–15.5)
WBC Count: 12.2 10*3/uL — ABNORMAL HIGH (ref 4.0–10.5)
nRBC: 0 % (ref 0.0–0.2)

## 2020-02-09 LAB — CMP (CANCER CENTER ONLY)
ALT: 8 U/L (ref 0–44)
AST: 13 U/L — ABNORMAL LOW (ref 15–41)
Albumin: 3.7 g/dL (ref 3.5–5.0)
Alkaline Phosphatase: 91 U/L (ref 38–126)
Anion gap: 7 (ref 5–15)
BUN: 21 mg/dL (ref 8–23)
CO2: 31 mmol/L (ref 22–32)
Calcium: 9.4 mg/dL (ref 8.9–10.3)
Chloride: 102 mmol/L (ref 98–111)
Creatinine: 0.89 mg/dL (ref 0.44–1.00)
GFR, Estimated: >60 mL/min (ref >60)
Glucose, Bld: 96 mg/dL (ref 70–99)
Potassium: 3.4 mmol/L — ABNORMAL LOW (ref 3.5–5.1)
Sodium: 140 mmol/L (ref 135–145)
Total Bilirubin: 0.5 mg/dL (ref 0.3–1.2)
Total Protein: 6.8 g/dL (ref 6.5–8.1)

## 2020-02-09 MED ORDER — SODIUM CHLORIDE 0.9% FLUSH
10.0000 mL | INTRAVENOUS | Status: DC | PRN
  Administered 2020-02-09: 10 mL
  Filled 2020-02-09: qty 10

## 2020-02-09 MED ORDER — HEPARIN SOD (PORK) LOCK FLUSH 100 UNIT/ML IV SOLN
500.0000 [IU] | Freq: Once | INTRAVENOUS | Status: AC | PRN
  Administered 2020-02-09: 500 [IU]
  Filled 2020-02-09: qty 5

## 2020-02-09 NOTE — Patient Instructions (Signed)

## 2020-02-14 NOTE — Progress Notes (Signed)
[facility] OFFICE PROGRESS NOTE  [name], MD [address] [place] [address]  DIAGNOSIS: Stage IV non-small cell lung cancer, squamous cell carcinoma. She presented withright upper lobe lung mass in addition to pleural-based metastasis andmediastinal lymphadenopathy.She was diagnosed in September 2020.  PRIOR THERAPY: Chemotherapy with carboplatin for an AUC of 5,paclitaxel 175 mg/m, and Keytruda 200 mg IV every 3 weeks with Neulasta support.Last dose9/15/21.Status post18cycles. Starting from cycle #5 was on maintenance treatment with single agent Keytruda every 3 weeks.This was discontinued due to evidence of disease progression.   CURRENT THERAPY: Palliative systemic chemotherapy with docetaxel 75 mg per metered squared and Cyramza 10 mg/kg IV every 3 weeks with Neulasta support. First dose on 01/04/20. Status post 2 cycles.   INTERVAL HISTORY: Debra Rodgers 74 y.o. female returns to the clinic today for a follow-up visit. The patient is feeling fairly well today without any concerning complaints except for intermittent left later rib pain. She rates her pain as a 3/10 and describes it as achy. She takes tylenol with improvement in her pain. The patient recently had evidence of disease progression and her treatment was subsequently switched to docetaxel and Cyramza. She is status post 2 cycles and has been tolerating this very well without any major side effects except for mild joint pain secondary to the neulasta injection.   She denies any recent fever, night sweats, or weight loss. She reports baseline night sweats which occurs nightly. She also reports feeling cold frequently. She reports her mild baseline cough for which she takes Robitussin and Tessalon if needed. She denies any chest pain or hemoptysis. She reports her baseline dyspnea on exertion. She denies any nausea, vomiting, or constipation. She denies abnormal diarrhea and states  it depends on "what she eats or drinks". She has frequent headaches for which she takes tylenol. She had a brain MRI performed recently for her headaches which did not show any metastatic disease to the brain. The patient is here for evaluation before starting cycle #3 of treatment.   MEDICAL HISTORY: Past Medical History:  Diagnosis Date   COPD (chronic obstructive pulmonary disease) ([place])    Depression    Essential hypertension    Headache    History of migraine headaches    lung ca dx'd 10/2018   Sleep apnea    Tobacco use disorder     ALLERGIES:  is allergic to codeine sulfate and pantoprazole sodium.  MEDICATIONS:  Current Outpatient Medications  Medication Sig Dispense Refill   albuterol (VENTOLIN HFA) 108 (90 Base) MCG/ACT inhaler Inhale 2 puffs into the lungs every 6 (six) hours as needed for wheezing or shortness of breath. 36 g 2   amLODipine (NORVASC) 10 MG tablet Take 1 tablet (10 mg total) by mouth daily. 90 tablet 3   atorvastatin (LIPITOR) 20 MG tablet Take 1 tablet (20 mg total) by mouth daily. 90 tablet 3   benzonatate (TESSALON) 100 MG capsule TAKE 1 CAPSULE EVERY 8 HOURS AS NEEDED FOR COUGH 90 capsule 0   chlorthalidone (HYGROTON) 50 MG tablet TAKE 1 TABLET EVERY DAY 90 tablet 3   dexamethasone (DECADRON) 4 MG tablet Please take TWO tablets TWICE a day the day before, the day of, and the day after chemotherapy 120 tablet 2   famotidine (PEPCID) 20 MG tablet Take 20 mg by mouth daily.      FLUoxetine (PROZAC) 20 MG capsule TAKE 1 CAPSULE EVERY DAY 90 capsule 3   fluticasone (FLONASE)  50 MCG/ACT nasal spray Place 1 spray into both nostrils daily. 11.1 mL 0   HYDROcodone-acetaminophen (NORCO) 7.5-325 MG tablet Take 1 tablet by mouth every 6 (six) hours as needed for moderate pain. 60 tablet 0   lidocaine-prilocaine (EMLA) cream Apply 1 application topically as needed. 30 g 0   magnesium oxide (MAG-OX) 400 MG tablet TAKE 1 TABLET BY MOUTH EVERY DAY 30  tablet 0   polyvinyl alcohol (LIQUIFILM TEARS) 1.4 % ophthalmic solution Place 1 drop into both eyes as needed for dry eyes.     potassium chloride SA (KLOR-CON M20) 20 MEQ tablet Take 1 tablet (20 mEq total) by mouth daily. 5 tablet 0   tiotropium (SPIRIVA HANDIHALER) 18 MCG inhalation capsule Place 1 capsule (18 mcg total) into inhaler and inhale daily. (Patient not taking: Reported on 01/26/2020) 30 capsule 2   No current facility-administered medications for this visit.    SURGICAL HISTORY:  Past Surgical History:  Procedure Laterality Date   ABDOMINAL HYSTERECTOMY     CHOLECYSTECTOMY     IR IMAGING GUIDED PORT INSERTION  02/24/2019   ROTATOR CUFF REPAIR  3/04   VIDEO BRONCHOSCOPY WITH ENDOBRONCHIAL ULTRASOUND Right 11/25/2018   Procedure: VIDEO BRONCHOSCOPY WITH ENDOBRONCHIAL ULTRASOUND;  Surgeon: Collene Gobble, MD;  Location: [facility] OR;  Service: Thoracic;  Laterality: Right;    REVIEW OF SYSTEMS:   Review of Systems  Constitutional: Negative for appetite change, chills, fatigue, fever and unexpected weight change.  HENT: Negative for mouth sores, nosebleeds, sore throat and trouble swallowing.   Eyes: Negative for eye problems and icterus.  Respiratory:Positive for baseline shortness of breath on exertion andmild cough.Negative for hemoptysis. Cardiovascular:Positive for left lateral rib pain. Negative forchest pain orleg swelling.  Gastrointestinal:Positive for baseline intermittent diarrhea (controlled).Negative for abdominal pain, constipation, nausea and vomiting.  Genitourinary: Negative for bladder incontinence, difficulty urinating, dysuria, frequency and hematuria.  Musculoskeletal: Negative for  gait problem, neck pain and neck stiffness.  Skin:Negative for itching and rash. Neurological:Positive for frequent headaches. Negative for dizziness, extremity weakness, light-headedness and seizures.  Hematological: Negative for adenopathy. Does not bruise/bleed  easily.  Psychiatric/Behavioral: Negative for confusion, depression and sleep disturbance. The patient is not nervous/anxious.  PHYSICAL EXAMINATION:  Blood pressure 121/76, pulse 91, temperature (!) 97.3 F (36.3 C), temperature source Tympanic, resp. rate 19, height 5' 1"  (1.549 m), weight 143 lb 4.8 oz (65 kg), SpO2 100 %.  ECOG PERFORMANCE STATUS: 1 - Symptomatic but completely ambulatory  Physical Exam  Constitutional: Oriented to person, place, and time and well-developed, well-nourished, and in no distress.  HENT:  Head: Normocephalic and atraumatic.  Mouth/Throat: Oropharynx is clear and moist. No oropharyngeal exudate.  Eyes: Conjunctivae are normal. Right eye exhibits no discharge. Left eye exhibits no discharge. No scleral icterus.  Neck: Normal range of motion. Neck supple.  Cardiovascular: Normal rate, regular rhythm, normal heart sounds and intact distal pulses.   Pulmonary/Chest: Effort normal and breath sounds normal. No respiratory distress. No wheezes. No rales.  Abdominal: Soft. Bowel sounds are normal. Exhibits no distension and no mass. There is no tenderness.  Musculoskeletal: Tenderness to palpation on the lateral left chest wall. Normal range of motion. Exhibits no edema.  Lymphadenopathy:    No cervical adenopathy.  Neurological: Alert and oriented to person, place, and time. Exhibits normal muscle tone. Gait normal. Coordination normal.  Skin: Skin is warm and dry. No rash noted. Not diaphoretic. No erythema. No pallor.  Psychiatric: Mood, memory and judgment normal.  Vitals reviewed.  LABORATORY DATA: Lab Results  Component Value Date   WBC 12.9 (H) 02/16/2020   HGB 10.5 (L) 02/16/2020   HCT 31.5 (L) 02/16/2020   MCV 95.7 02/16/2020   PLT 258 02/16/2020      Chemistry      Component Value Date/Time   NA 137 02/16/2020 0934   NA 139 07/21/2017 0958   K 3.6 02/16/2020 0934   CL 101 02/16/2020 0934   CO2 25 02/16/2020 0934   BUN 35 (H) 02/16/2020  0934   BUN 26 07/21/2017 0958   CREATININE 1.07 (H) 02/16/2020 0934   CREATININE 0.79 09/03/2013 1540      Component Value Date/Time   CALCIUM 9.8 02/16/2020 0934   ALKPHOS 73 02/16/2020 0934   AST 13 (L) 02/16/2020 0934   ALT 8 02/16/2020 0934   BILITOT 0.6 02/16/2020 0934       RADIOGRAPHIC STUDIES:  No results found.   ASSESSMENT/PLAN:  This is a very pleasant 74 year old African-American female diagnosed with stage IV non-small cell lung cancer, squamous cell carcinoma. She presented with a right upper lobe lung mass and pleural based metastases and mediastinal lymphadenopathy.She was diagnosed in August 2020   The patient was on systemicchemotherapy with carboplatin for an AUC of 5, paclitaxel 175 mg/m, and Keytruda 200 mg IV every 3 weeks with Neulasta support.Starting from cycle #5, she was on maintenance with single agent Keytruda.She is status post18cyclesof treatment and she tolerated it wellwithout any concerning adverse effectsexcept for mild fatigue.This was discontinued secondary to evidence of disease progression.  The patient is currently undergoing palliative systemic chemotherapy with docetaxel 75 mg per metered squared and Cyramza 10 mg/kg IV every 3 weeks with Neulasta support. She is status post 2 cycles and she is tolerating this well.    Labs were reviewed. Recommend that she procced with cycle #3 today as scheduled.   I will arrange for a restaging CT scan of the chest, abdomen, and pelvis Before starting cycle #4. We will be able to better assess the left lateral ribs to see if there is any metastatic disease to this area. The area is mildly tender to palpation. The pain is not exertional. She rates her pain a 3/10 that is relieved with tylenol. No new respiratory symptoms.    We will see her back for a follow up visit in 3 weeks for evaluation and to review her scan before starting cycle #4.   The patient was advised to call immediately if she  has any concerning symptoms in the interval. The patient voices understanding of current disease status and treatment options and is in agreement with the current care plan. All questions were answered. The patient knows to call the clinic with any problems, questions or concerns. We can certainly see the patient much sooner if necessary             Orders Placed This Encounter  Procedures   CT Chest W Contrast    Standing Status:   Future    Standing Expiration Date:   02/15/2021    Order Specific Question:   If indicated for the ordered procedure, I authorize the administration of contrast media per Radiology protocol    Answer:   Yes    Order Specific Question:   Preferred imaging location?    Answer:   [facility]   CT Abdomen Pelvis W Contrast    Standing Status:   Future    Standing Expiration Date:   02/15/2021  Order Specific Question:   If indicated for the ordered procedure, I authorize the administration of contrast media per Radiology protocol    Answer:   Yes    Order Specific Question:   Preferred imaging location?    Answer:   [facility]    Order Specific Question:   Is Oral Contrast requested for this exam?    Answer:   Yes, Per Radiology protocol   TSH    Standing Status:   Standing    Number of Occurrences:   18    Standing Expiration Date:   02/15/2021     Khylee Algeo L Edda Orea, PA-C 02/16/20

## 2020-02-16 ENCOUNTER — Inpatient Hospital Stay: Payer: Medicare HMO

## 2020-02-16 ENCOUNTER — Inpatient Hospital Stay (HOSPITAL_BASED_OUTPATIENT_CLINIC_OR_DEPARTMENT_OTHER): Payer: Medicare HMO | Admitting: Physician Assistant

## 2020-02-16 ENCOUNTER — Other Ambulatory Visit: Payer: Self-pay

## 2020-02-16 VITALS — BP 121/76 | HR 91 | Temp 97.3°F | Resp 19 | Ht 61.0 in | Wt 143.3 lb

## 2020-02-16 DIAGNOSIS — C3491 Malignant neoplasm of unspecified part of right bronchus or lung: Secondary | ICD-10-CM

## 2020-02-16 DIAGNOSIS — Z5111 Encounter for antineoplastic chemotherapy: Secondary | ICD-10-CM

## 2020-02-16 DIAGNOSIS — Z5112 Encounter for antineoplastic immunotherapy: Secondary | ICD-10-CM | POA: Diagnosis not present

## 2020-02-16 LAB — CBC WITH DIFFERENTIAL (CANCER CENTER ONLY)
Abs Immature Granulocytes: 0.08 10*3/uL — ABNORMAL HIGH (ref 0.00–0.07)
Basophils Absolute: 0 10*3/uL (ref 0.0–0.1)
Basophils Relative: 0 %
Eosinophils Absolute: 0 10*3/uL (ref 0.0–0.5)
Eosinophils Relative: 0 %
HCT: 31.5 % — ABNORMAL LOW (ref 36.0–46.0)
Hemoglobin: 10.5 g/dL — ABNORMAL LOW (ref 12.0–15.0)
Immature Granulocytes: 1 %
Lymphocytes Relative: 9 %
Lymphs Abs: 1.2 10*3/uL (ref 0.7–4.0)
MCH: 31.9 pg (ref 26.0–34.0)
MCHC: 33.3 g/dL (ref 30.0–36.0)
MCV: 95.7 fL (ref 80.0–100.0)
Monocytes Absolute: 0.6 10*3/uL (ref 0.1–1.0)
Monocytes Relative: 5 %
Neutro Abs: 11.1 10*3/uL — ABNORMAL HIGH (ref 1.7–7.7)
Neutrophils Relative %: 85 %
Platelet Count: 258 10*3/uL (ref 150–400)
RBC: 3.29 MIL/uL — ABNORMAL LOW (ref 3.87–5.11)
RDW: 16.5 % — ABNORMAL HIGH (ref 11.5–15.5)
WBC Count: 12.9 10*3/uL — ABNORMAL HIGH (ref 4.0–10.5)
nRBC: 0 % (ref 0.0–0.2)

## 2020-02-16 LAB — CMP (CANCER CENTER ONLY)
ALT: 8 U/L (ref 0–44)
AST: 13 U/L — ABNORMAL LOW (ref 15–41)
Albumin: 3.9 g/dL (ref 3.5–5.0)
Alkaline Phosphatase: 73 U/L (ref 38–126)
Anion gap: 11 (ref 5–15)
BUN: 35 mg/dL — ABNORMAL HIGH (ref 8–23)
CO2: 25 mmol/L (ref 22–32)
Calcium: 9.8 mg/dL (ref 8.9–10.3)
Chloride: 101 mmol/L (ref 98–111)
Creatinine: 1.07 mg/dL — ABNORMAL HIGH (ref 0.44–1.00)
GFR, Estimated: 55 mL/min — ABNORMAL LOW (ref >60)
Glucose, Bld: 92 mg/dL (ref 70–99)
Potassium: 3.6 mmol/L (ref 3.5–5.1)
Sodium: 137 mmol/L (ref 135–145)
Total Bilirubin: 0.6 mg/dL (ref 0.3–1.2)
Total Protein: 7.4 g/dL (ref 6.5–8.1)

## 2020-02-16 LAB — TOTAL PROTEIN, URINE DIPSTICK: Protein, ur: NEGATIVE mg/dL

## 2020-02-16 MED ORDER — SODIUM CHLORIDE 0.9% FLUSH
10.0000 mL | INTRAVENOUS | Status: DC | PRN
  Administered 2020-02-16: 10 mL
  Filled 2020-02-16: qty 10

## 2020-02-16 MED ORDER — ACETAMINOPHEN 325 MG PO TABS
650.0000 mg | ORAL_TABLET | Freq: Once | ORAL | Status: AC
  Administered 2020-02-16: 650 mg via ORAL

## 2020-02-16 MED ORDER — SODIUM CHLORIDE 0.9 % IV SOLN
10.0000 mg | Freq: Once | INTRAVENOUS | Status: AC
  Administered 2020-02-16: 10 mg via INTRAVENOUS
  Filled 2020-02-16: qty 10

## 2020-02-16 MED ORDER — ACETAMINOPHEN 325 MG PO TABS
ORAL_TABLET | ORAL | Status: AC
  Filled 2020-02-16: qty 2

## 2020-02-16 MED ORDER — HEPARIN SOD (PORK) LOCK FLUSH 100 UNIT/ML IV SOLN
500.0000 [IU] | Freq: Once | INTRAVENOUS | Status: AC | PRN
  Administered 2020-02-16: 500 [IU]
  Filled 2020-02-16: qty 5

## 2020-02-16 MED ORDER — SODIUM CHLORIDE 0.9 % IV SOLN
10.0000 mg/kg | Freq: Once | INTRAVENOUS | Status: AC
  Administered 2020-02-16: 600 mg via INTRAVENOUS
  Filled 2020-02-16: qty 50

## 2020-02-16 MED ORDER — SODIUM CHLORIDE 0.9 % IV SOLN
Freq: Once | INTRAVENOUS | Status: AC
  Filled 2020-02-16: qty 250

## 2020-02-16 MED ORDER — DIPHENHYDRAMINE HCL 25 MG PO CAPS
ORAL_CAPSULE | ORAL | Status: AC
  Filled 2020-02-16: qty 2

## 2020-02-16 MED ORDER — DIPHENHYDRAMINE HCL 50 MG/ML IJ SOLN
INTRAMUSCULAR | Status: AC
  Filled 2020-02-16: qty 1

## 2020-02-16 MED ORDER — DIPHENHYDRAMINE HCL 50 MG/ML IJ SOLN
50.0000 mg | Freq: Once | INTRAMUSCULAR | Status: AC
  Administered 2020-02-16: 50 mg via INTRAVENOUS

## 2020-02-16 MED ORDER — SODIUM CHLORIDE 0.9 % IV SOLN
75.0000 mg/m2 | Freq: Once | INTRAVENOUS | Status: AC
  Administered 2020-02-16: 120 mg via INTRAVENOUS
  Filled 2020-02-16: qty 12

## 2020-02-16 NOTE — Progress Notes (Signed)
Pt discharged in stable condition.

## 2020-02-16 NOTE — Patient Instructions (Signed)
[facility] Discharge Instructions for Patients Receiving Chemotherapy  Today you received the following chemotherapy agents: Cyramza, Taxotere  To help prevent nausea and vomiting after your treatment, we encourage you to take your nausea medication as directed.    If you develop nausea and vomiting that is not controlled by your nausea medication, call the clinic.   BELOW ARE SYMPTOMS THAT SHOULD BE REPORTED IMMEDIATELY:  *FEVER GREATER THAN 100.5 F  *CHILLS WITH OR WITHOUT FEVER  NAUSEA AND VOMITING THAT IS NOT CONTROLLED WITH YOUR NAUSEA MEDICATION  *UNUSUAL SHORTNESS OF BREATH  *UNUSUAL BRUISING OR BLEEDING  TENDERNESS IN MOUTH AND THROAT WITH OR WITHOUT PRESENCE OF ULCERS  *URINARY PROBLEMS  *BOWEL PROBLEMS  UNUSUAL RASH Items with * indicate a potential emergency and should be followed up as soon as possible.  Feel free to call the clinic should you have any questions or concerns. The clinic phone number is [phone].  Please show the [facility] at check-in to the Emergency Department and triage nurse.

## 2020-02-18 ENCOUNTER — Other Ambulatory Visit: Payer: Self-pay

## 2020-02-18 ENCOUNTER — Inpatient Hospital Stay: Payer: Medicare HMO

## 2020-02-18 VITALS — BP 136/89 | HR 69 | Temp 98.7°F | Resp 20

## 2020-02-18 DIAGNOSIS — Z5112 Encounter for antineoplastic immunotherapy: Secondary | ICD-10-CM | POA: Diagnosis not present

## 2020-02-18 DIAGNOSIS — C3491 Malignant neoplasm of unspecified part of right bronchus or lung: Secondary | ICD-10-CM

## 2020-02-18 MED ORDER — PEGFILGRASTIM-JMDB 6 MG/0.6ML ~~LOC~~ SOSY
6.0000 mg | PREFILLED_SYRINGE | Freq: Once | SUBCUTANEOUS | Status: AC
  Administered 2020-02-18: 6 mg via SUBCUTANEOUS

## 2020-02-18 MED ORDER — PEGFILGRASTIM-JMDB 6 MG/0.6ML ~~LOC~~ SOSY
PREFILLED_SYRINGE | SUBCUTANEOUS | Status: AC
  Filled 2020-02-18: qty 0.6

## 2020-02-18 NOTE — Patient Instructions (Signed)

## 2020-02-21 ENCOUNTER — Telehealth: Payer: Self-pay | Admitting: Physician Assistant

## 2020-02-21 NOTE — Telephone Encounter (Signed)
Scheduled per los. Called and left msg. Mailed printout  °

## 2020-02-23 ENCOUNTER — Other Ambulatory Visit: Payer: Self-pay | Admitting: Physician Assistant

## 2020-02-23 ENCOUNTER — Other Ambulatory Visit: Payer: Self-pay

## 2020-02-23 ENCOUNTER — Inpatient Hospital Stay: Payer: Medicare HMO | Attending: Physician Assistant

## 2020-02-23 ENCOUNTER — Telehealth: Payer: Self-pay | Admitting: Physician Assistant

## 2020-02-23 ENCOUNTER — Inpatient Hospital Stay: Payer: Medicare HMO

## 2020-02-23 DIAGNOSIS — F329 Major depressive disorder, single episode, unspecified: Secondary | ICD-10-CM | POA: Diagnosis not present

## 2020-02-23 DIAGNOSIS — G4730 Sleep apnea, unspecified: Secondary | ICD-10-CM | POA: Insufficient documentation

## 2020-02-23 DIAGNOSIS — R51 Headache with orthostatic component, not elsewhere classified: Secondary | ICD-10-CM | POA: Diagnosis not present

## 2020-02-23 DIAGNOSIS — Z95828 Presence of other vascular implants and grafts: Secondary | ICD-10-CM

## 2020-02-23 DIAGNOSIS — C3412 Malignant neoplasm of upper lobe, left bronchus or lung: Secondary | ICD-10-CM | POA: Diagnosis not present

## 2020-02-23 DIAGNOSIS — C782 Secondary malignant neoplasm of pleura: Secondary | ICD-10-CM | POA: Diagnosis not present

## 2020-02-23 DIAGNOSIS — Z5112 Encounter for antineoplastic immunotherapy: Secondary | ICD-10-CM | POA: Insufficient documentation

## 2020-02-23 DIAGNOSIS — C3491 Malignant neoplasm of unspecified part of right bronchus or lung: Secondary | ICD-10-CM

## 2020-02-23 DIAGNOSIS — I10 Essential (primary) hypertension: Secondary | ICD-10-CM | POA: Insufficient documentation

## 2020-02-23 DIAGNOSIS — J449 Chronic obstructive pulmonary disease, unspecified: Secondary | ICD-10-CM | POA: Diagnosis not present

## 2020-02-23 DIAGNOSIS — E876 Hypokalemia: Secondary | ICD-10-CM

## 2020-02-23 LAB — CBC WITH DIFFERENTIAL (CANCER CENTER ONLY)
Abs Immature Granulocytes: 0.44 10*3/uL — ABNORMAL HIGH (ref 0.00–0.07)
Basophils Absolute: 0 10*3/uL (ref 0.0–0.1)
Basophils Relative: 0 %
Eosinophils Absolute: 0.1 10*3/uL (ref 0.0–0.5)
Eosinophils Relative: 2 %
HCT: 30.1 % — ABNORMAL LOW (ref 36.0–46.0)
Hemoglobin: 10.3 g/dL — ABNORMAL LOW (ref 12.0–15.0)
Immature Granulocytes: 6 %
Lymphocytes Relative: 24 %
Lymphs Abs: 1.7 10*3/uL (ref 0.7–4.0)
MCH: 32.6 pg (ref 26.0–34.0)
MCHC: 34.2 g/dL (ref 30.0–36.0)
MCV: 95.3 fL (ref 80.0–100.0)
Monocytes Absolute: 1.3 10*3/uL — ABNORMAL HIGH (ref 0.1–1.0)
Monocytes Relative: 18 %
Neutro Abs: 3.4 10*3/uL (ref 1.7–7.7)
Neutrophils Relative %: 50 %
Platelet Count: 114 10*3/uL — ABNORMAL LOW (ref 150–400)
RBC: 3.16 MIL/uL — ABNORMAL LOW (ref 3.87–5.11)
RDW: 16.5 % — ABNORMAL HIGH (ref 11.5–15.5)
WBC Count: 6.9 10*3/uL (ref 4.0–10.5)
nRBC: 0 % (ref 0.0–0.2)

## 2020-02-23 LAB — CMP (CANCER CENTER ONLY)
ALT: 7 U/L (ref 0–44)
AST: 13 U/L — ABNORMAL LOW (ref 15–41)
Albumin: 3.6 g/dL (ref 3.5–5.0)
Alkaline Phosphatase: 75 U/L (ref 38–126)
Anion gap: 7 (ref 5–15)
BUN: 20 mg/dL (ref 8–23)
CO2: 30 mmol/L (ref 22–32)
Calcium: 9.7 mg/dL (ref 8.9–10.3)
Chloride: 99 mmol/L (ref 98–111)
Creatinine: 0.89 mg/dL (ref 0.44–1.00)
GFR, Estimated: >60 mL/min (ref >60)
Glucose, Bld: 83 mg/dL (ref 70–99)
Potassium: 3.2 mmol/L — ABNORMAL LOW (ref 3.5–5.1)
Sodium: 136 mmol/L (ref 135–145)
Total Bilirubin: 1.1 mg/dL (ref 0.3–1.2)
Total Protein: 6.6 g/dL (ref 6.5–8.1)

## 2020-02-23 LAB — TSH: TSH: 3.407 u[IU]/mL (ref 0.308–3.960)

## 2020-02-23 MED ORDER — HEPARIN SOD (PORK) LOCK FLUSH 100 UNIT/ML IV SOLN
500.0000 [IU] | Freq: Once | INTRAVENOUS | Status: AC | PRN
  Administered 2020-02-23: 500 [IU]
  Filled 2020-02-23: qty 5

## 2020-02-23 MED ORDER — SODIUM CHLORIDE 0.9% FLUSH
10.0000 mL | INTRAVENOUS | Status: DC | PRN
  Administered 2020-02-23: 10 mL
  Filled 2020-02-23: qty 10

## 2020-02-23 MED ORDER — POTASSIUM CHLORIDE CRYS ER 20 MEQ PO TBCR: 20.0000 meq | EXTENDED_RELEASE_TABLET | Freq: Every day | ORAL | 0 refills | Status: DC

## 2020-02-23 NOTE — Telephone Encounter (Signed)
I called the patient and let her know I refilled her potassium supplement. She expressed understanding.

## 2020-02-24 MED ORDER — ZOLEDRONIC ACID 4 MG/100ML IV SOLN
INTRAVENOUS | Status: AC
  Filled 2020-02-24: qty 100

## 2020-03-01 ENCOUNTER — Other Ambulatory Visit: Payer: Medicare HMO

## 2020-03-02 ENCOUNTER — Ambulatory Visit (HOSPITAL_COMMUNITY): Payer: Medicare HMO

## 2020-03-02 ENCOUNTER — Inpatient Hospital Stay: Payer: Medicare HMO

## 2020-03-02 ENCOUNTER — Other Ambulatory Visit: Payer: Self-pay

## 2020-03-02 ENCOUNTER — Telehealth: Payer: Self-pay

## 2020-03-02 DIAGNOSIS — C3491 Malignant neoplasm of unspecified part of right bronchus or lung: Secondary | ICD-10-CM

## 2020-03-02 DIAGNOSIS — Z95828 Presence of other vascular implants and grafts: Secondary | ICD-10-CM

## 2020-03-02 DIAGNOSIS — Z5112 Encounter for antineoplastic immunotherapy: Secondary | ICD-10-CM | POA: Diagnosis not present

## 2020-03-02 LAB — CBC WITH DIFFERENTIAL (CANCER CENTER ONLY)
Abs Immature Granulocytes: 0.06 10*3/uL (ref 0.00–0.07)
Basophils Absolute: 0.1 10*3/uL (ref 0.0–0.1)
Basophils Relative: 1 %
Eosinophils Absolute: 0 10*3/uL (ref 0.0–0.5)
Eosinophils Relative: 1 %
HCT: 30.2 % — ABNORMAL LOW (ref 36.0–46.0)
Hemoglobin: 10.1 g/dL — ABNORMAL LOW (ref 12.0–15.0)
Immature Granulocytes: 1 %
Lymphocytes Relative: 26 %
Lymphs Abs: 1.6 10*3/uL (ref 0.7–4.0)
MCH: 32.6 pg (ref 26.0–34.0)
MCHC: 33.4 g/dL (ref 30.0–36.0)
MCV: 97.4 fL (ref 80.0–100.0)
Monocytes Absolute: 0.4 10*3/uL (ref 0.1–1.0)
Monocytes Relative: 7 %
Neutro Abs: 3.8 10*3/uL (ref 1.7–7.7)
Neutrophils Relative %: 64 %
Platelet Count: 169 10*3/uL (ref 150–400)
RBC: 3.1 MIL/uL — ABNORMAL LOW (ref 3.87–5.11)
RDW: 17.6 % — ABNORMAL HIGH (ref 11.5–15.5)
WBC Count: 5.9 10*3/uL (ref 4.0–10.5)
nRBC: 0 % (ref 0.0–0.2)

## 2020-03-02 LAB — CMP (CANCER CENTER ONLY)
ALT: 8 U/L (ref 0–44)
AST: 13 U/L — ABNORMAL LOW (ref 15–41)
Albumin: 3.6 g/dL (ref 3.5–5.0)
Alkaline Phosphatase: 75 U/L (ref 38–126)
Anion gap: 8 (ref 5–15)
BUN: 18 mg/dL (ref 8–23)
CO2: 27 mmol/L (ref 22–32)
Calcium: 9.2 mg/dL (ref 8.9–10.3)
Chloride: 103 mmol/L (ref 98–111)
Creatinine: 0.87 mg/dL (ref 0.44–1.00)
GFR, Estimated: >60 mL/min (ref >60)
Glucose, Bld: 87 mg/dL (ref 70–99)
Potassium: 3.6 mmol/L (ref 3.5–5.1)
Sodium: 138 mmol/L (ref 135–145)
Total Bilirubin: 0.6 mg/dL (ref 0.3–1.2)
Total Protein: 6.7 g/dL (ref 6.5–8.1)

## 2020-03-02 MED ORDER — SODIUM CHLORIDE 0.9% FLUSH
10.0000 mL | INTRAVENOUS | Status: DC | PRN
  Administered 2020-03-02: 10 mL
  Filled 2020-03-02: qty 10

## 2020-03-02 MED ORDER — HEPARIN SOD (PORK) LOCK FLUSH 100 UNIT/ML IV SOLN
500.0000 [IU] | Freq: Once | INTRAVENOUS | Status: AC | PRN
  Administered 2020-03-02: 500 [IU]
  Filled 2020-03-02: qty 5

## 2020-03-02 MED ORDER — ALTEPLASE 2 MG IJ SOLR
2.0000 mg | Freq: Once | INTRAMUSCULAR | Status: DC | PRN
  Filled 2020-03-02: qty 2

## 2020-03-02 NOTE — Patient Instructions (Signed)
[employer], Adult A central line is a soft, flexible tube (catheter) that can be used to collect blood for testing or to give medicine through a vein. The tip of the central line ends in a large vein just above the heart (vena cava). A central line may be placed because:  You need to get medicines or fluids through an IV tube for a long period of time.  You need nutrition but cannot eat or absorb nutrients.  The veins in your hands or arms are hard to access.  You need a blood transfusion.  You need chemotherapy or dialysis. Types of central lines There are four main types of central lines:  Peripherally inserted central catheter (PICC) line. This type is used for intermediate access to long-term access of one week or more. It can be used to draw blood and give fluids or medicines. A PICC looks like an IV tube, but it goes up the arm to the heart. It is usually inserted in the upper arm and taped in place on the arm.  Tunneled central line. This type is used for long-term therapy and dialysis. It is placed in a large vein in the neck, chest, or groin. It is inserted through a small incision made over the vein, then it is advanced into the heart. It is tunneled under the skin and brought out through a second incision.  Non-tunneled central line. This type is used for short-term access, usually of a maximum of 7 days. It is often used in the emergency department. It is inserted in the neck, chest, or groin.  Implanted port. This type is used for long-term therapy. It can stay in place longer than other types of central lines. It is normally inserted in the upper chest, but it can also be placed in the upper arm or the abdomen. It is inserted and removed with surgery, and it is accessed using a needle. The type of central line that you receive depends on how long you will need it, your medical condition, and the condition of your veins. What are the risks? Using any type of central line has  risks to be aware of, including:  Infection.  A blood clot that blocks the central line or forms in the vein and travels to the heart.  Bleeding from the place where the central line was inserted.  Developing a hole or crack within the central line. If this happens, the central line will need to be replaced.  Central line failure. Follow these instructions at home: Flushing and cleaning the central line   Follow instructions from your health care provider about flushing and cleaning the central line and the area around it.  Only flush with sterile supplies. Use supplies from your health care provider, a pharmacy, or another source that is recommended by your health care provider.  Before you flush the central line or clean the central line or the area around it: ? Wash your hands with soap and water or alcohol-based hand sanitizer. ? Clean the central line hub with rubbing alcohol. Caring for the incision or central line site  Check your incision or central line site every day for signs of infection. Check for: ? Redness, swelling, or pain. ? Fluid or blood. ? Warmth. ? Pus or a bad smell. General instructions  Follow instructions from your health care provider for the type of device that you have.  Keep the tube clamped, unless it is being used.  Keep your supplies in  a clean, dry location.  If the central line accidentally gets pulled on, make sure: ? The bandage (dressing) is okay. ? There is no bleeding. ? The line has not been pulled out.  Do not use scissors or sharp objects near the tube.  Do not swim or let the central line soak in the tub.  Ask your health care provider what activities are safe for you. You may be restricted from lifting or making repetitive arm movements on the side of your central line.  Take over-the-counter and prescription medicines only as told by your health care provider.  Keep the insertion site of your central line clean and dry at  all times.  Keep your dressing dry. If it gets wet, have it changed as soon as possible.  Keep all follow-up visits as told by your health care provider. This is important. Disposal of supplies  Throw away any syringes in a disposal container that is meant for sharp items (sharps container). You can buy a sharps container from a pharmacy, or you can make one by using an empty hard plastic bottle with a cover.  Place any used dressings or infusion bags into a plastic bag. Throw that bag in the trash. Contact a health care provider if:  You have redness, swelling, or pain around your incision.  You have fluid or blood coming from your incision.  Your incision feels warm to the touch.  You have pus or a bad smell coming from your incision. Get help right away if:  You have: ? A fever or chills. ? Shortness of breath. ? Chest pain or a racing heart beat. ? Swelling in your neck, face, chest, or arm on the side of your central line.  You feel dizzy or you faint.  Your incision or central line site has red streaks spreading away from the area.  Your incision or central line site is bleeding and does not stop.  Your central line is difficult to flush or will not flush.  You do not get a blood return from the central line.  Your central line gets loose or damaged or comes out.  Your catheter leaks when flushed or when fluids are infused into it. Summary  A central line is a soft, flexible tube (catheter) that can be used to give medicine through a vein.  Follow specific instructions from your health care provider for the type of device that you have.  Keep the insertion site of your central line clean and dry at all times.  Keep the tube clamped, unless it is being used. This information is not intended to replace advice given to you by your health care provider. Make sure you discuss any questions you have with your health care provider. Document Revised: 07/01/2018  Document Reviewed: 04/12/2016 Elsevier Patient Education  [place].

## 2020-03-02 NOTE — Telephone Encounter (Signed)
Pt LM indicating she is having nosebleeds, headaches and decreased taste. I called the pt and she clarified that she has some blood when she blows her nose.  Discussed with Cassie, PA-C who advised pt can take Tylenol for her headaches and pt needs to keep her nose moist and the frequent blowing can cause dryness and irritation, thus casing nosebleeds.  I have advised the pt as indicated and she expressed understanding of this information.

## 2020-03-03 NOTE — Progress Notes (Signed)
[place] OFFICE PROGRESS NOTE  [name], MD [address] [place] [address]  DIAGNOSIS: Stage IV non-small cell lung cancer, squamous cell carcinoma. She presented withright upper lobe lung mass in addition to pleural-based metastasis andmediastinal lymphadenopathy.She was diagnosed in September 2020.  PRIOR THERAPY: Chemotherapy with carboplatin for an AUC of 5,paclitaxel 175 mg/m, and Keytruda 200 mg IV every 3 weeks with Neulasta support.Last dose9/15/21.Status post18cycles. Starting from cycle #5wason maintenance treatment with single agent Keytruda every 3 weeks.This was discontinued due to evidence of disease progression.  CURRENT THERAPY: Palliative systemic chemotherapy with docetaxel 75 mg per metered squared and Cyramza 10 mg/kg IV every 3 weeks with Neulasta support. First dose on 01/04/20. Status post 3 cycles.   INTERVAL HISTORY: Debra Rodgers 74 y.o. female returns totheclinic today forafollow-up visit. The patient is feelingfairly welltoday without any concerning complaints. She is having some nail darkening from her treatment and feels sleepy after her neulasta injection. The patient called endorsing headaches last week.  Regarding the headaches, the patient had a brain MRI in October 2021 to further evaluate her headaches which did not show a clear etiology of her headaches. Denies headaches today.   The patient was supposed to have a restaging CT scan the chest, abdomen, and pelvis prior to her appointment today but this was not scheduled until 03/15/2020 due to insurance.   Otherwise, the patient denies any fever or chills. She reports intermittent night sweats but states they are better. She reports her appetite comes and goes. She tries to drink ensure. She lost about 2 lbs since her last appointment. She has been seen by nutrition in the past. She has a mild baseline cough for which she takes Robitussin and  Tessalon if needed. Her cough is better compared to prior. She denies any hemoptysis.  She has been endorsing an achy intermittent left lateral rib pain for which she has been taking Tylenol.  She reports her baseline dyspnea on exertion.  She denies any nausea, vomiting, or constipation.  The patient states that yesterday she was turning too fast and she fell. She did not hit her head. She just bumped her knee. She does not believe she needs any assistive devices at this time. The patient is here today for evaluation before starting cycle #4.  MEDICAL HISTORY: Past Medical History:  Diagnosis Date  . COPD (chronic obstructive pulmonary disease) ([place])   . Depression   . Essential hypertension   . Headache   . History of migraine headaches   . lung ca dx'd 10/2018  . Sleep apnea   . Tobacco use disorder     ALLERGIES:  is allergic to codeine sulfate and pantoprazole sodium.  MEDICATIONS:  Current Outpatient Medications  Medication Sig Dispense Refill  . albuterol (VENTOLIN HFA) 108 (90 Base) MCG/ACT inhaler Inhale 2 puffs into the lungs every 6 (six) hours as needed for wheezing or shortness of breath. 36 g 2  . amLODipine (NORVASC) 10 MG tablet Take 1 tablet (10 mg total) by mouth daily. 90 tablet 3  . atorvastatin (LIPITOR) 20 MG tablet Take 1 tablet (20 mg total) by mouth daily. 90 tablet 3  . benzonatate (TESSALON) 100 MG capsule TAKE 1 CAPSULE EVERY 8 HOURS AS NEEDED FOR COUGH 90 capsule 0  . chlorthalidone (HYGROTON) 50 MG tablet TAKE 1 TABLET EVERY DAY 90 tablet 3  . dexamethasone (DECADRON) 4 MG tablet Please take TWO tablets TWICE a day the day before, the  day of, and the day after chemotherapy 120 tablet 2  . famotidine (PEPCID) 20 MG tablet Take 20 mg by mouth daily.     Marland Kitchen FLUoxetine (PROZAC) 20 MG capsule TAKE 1 CAPSULE EVERY DAY 90 capsule 3  . HYDROcodone-acetaminophen (NORCO) 7.5-325 MG tablet Take 1 tablet by mouth every 6 (six) hours as needed for moderate pain. 60 tablet 0   . lidocaine-prilocaine (EMLA) cream Apply 1 application topically as needed. 30 g 0  . magnesium oxide (MAG-OX) 400 MG tablet TAKE 1 TABLET BY MOUTH EVERY DAY 30 tablet 0  . polyvinyl alcohol (LIQUIFILM TEARS) 1.4 % ophthalmic solution Place 1 drop into both eyes as needed for dry eyes.    . potassium chloride SA (KLOR-CON M20) 20 MEQ tablet Take 1 tablet (20 mEq total) by mouth daily. 6 tablet 0  . fluticasone (FLONASE) 50 MCG/ACT nasal spray Place 1 spray into both nostrils daily. 11.1 mL 0  . tiotropium (SPIRIVA HANDIHALER) 18 MCG inhalation capsule Place 1 capsule (18 mcg total) into inhaler and inhale daily. (Patient not taking: Reported on 03/08/2020) 30 capsule 2   No current facility-administered medications for this visit.    SURGICAL HISTORY:  Past Surgical History:  Procedure Laterality Date  . ABDOMINAL HYSTERECTOMY    . CHOLECYSTECTOMY    . IR IMAGING GUIDED PORT INSERTION  02/24/2019  . ROTATOR CUFF REPAIR  3/04  . VIDEO BRONCHOSCOPY WITH ENDOBRONCHIAL ULTRASOUND Right 11/25/2018   Procedure: VIDEO BRONCHOSCOPY WITH ENDOBRONCHIAL ULTRASOUND;  Surgeon: Collene Gobble, MD;  Location: [facility] OR;  Service: Thoracic;  Laterality: Right;    REVIEW OF SYSTEMS:   Review of Systems  Constitutional: Positive for appetite change, fatigue, and weight loss. Negative for chills and fever.  HENT: Negative for mouth sores, nosebleeds, sore throat and trouble swallowing.  Eyes: Negative for eye problems and icterus. Respiratory:Positive for baseline shortness of breath on exertion andmild cough.Negative for hemoptysis. Cardiovascular:Positive for left lateral rib pain. Negative forchest pain orleg swelling.  Gastrointestinal:Positive for baseline intermittent diarrhea(controlled).Negative for abdominal pain, constipation, nausea and vomiting.  Genitourinary: Negative for bladder incontinence, difficulty urinating, dysuria, frequency and hematuria.  Musculoskeletal:Negative for  gait problem, neck pain and neck stiffness.  Skin:Negative for itching and rash. Neurological:Negative for dizziness,extremity weakness, light-headedness and seizures.  Hematological: Negative for adenopathy. Does not bruise/bleed easily.  Psychiatric/Behavioral: Negative for confusion, depression and sleep disturbance. The patient is not nervous/anxious.     PHYSICAL EXAMINATION:  Blood pressure 125/83, pulse 89, temperature 98.2 F (36.8 C), temperature source Tympanic, resp. rate 18, height 5' 1"  (1.549 m), weight 141 lb 11.2 oz (64.3 kg), SpO2 100 %.  ECOG PERFORMANCE STATUS: 1 - Symptomatic but completely ambulatory  Physical Exam  Constitutional: Oriented to person, place, and time and well-developed, well-nourished, and in no distress.  HENT:  Head: Normocephalic and atraumatic.  Mouth/Throat: Oropharynx is clear and moist. No oropharyngeal exudate.  Eyes: Conjunctivae are normal. Right eye exhibits no discharge. Left eye exhibits no discharge. No scleral icterus.  Neck: Normal range of motion. Neck supple.  Cardiovascular: Normal rate, regular rhythm, normal heart sounds and intact distal pulses.   Pulmonary/Chest: Effort normal and breath sounds normal. No respiratory distress. No wheezes. No rales.  Abdominal: Soft. Bowel sounds are normal. Exhibits no distension and no mass. There is no tenderness.  Musculoskeletal: Tenderness to palpation on the lateral left chest wall. Normal range of motion. Exhibits no edema.  Lymphadenopathy:    No cervical adenopathy.  Neurological: Alert and oriented to  person, place, and time. Exhibits normal muscle tone. Gait normal. Coordination normal.  Skin: Skin is warm and dry. No rash noted. Not diaphoretic. No erythema. No pallor.  Psychiatric: Mood, memory and judgment normal.  Vitals reviewed.  LABORATORY DATA: Lab Results  Component Value Date   WBC 9.1 03/08/2020   HGB 9.9 (L) 03/08/2020   HCT 29.3 (L) 03/08/2020   MCV 96.1  03/08/2020   PLT 267 03/08/2020      Chemistry      Component Value Date/Time   NA 138 03/02/2020 0938   NA 139 07/21/2017 0958   K 3.6 03/02/2020 0938   CL 103 03/02/2020 0938   CO2 27 03/02/2020 0938   BUN 18 03/02/2020 0938   BUN 26 07/21/2017 0958   CREATININE 0.87 03/02/2020 0938   CREATININE 0.79 09/03/2013 1540      Component Value Date/Time   CALCIUM 9.2 03/02/2020 0938   ALKPHOS 75 03/02/2020 0938   AST 13 (L) 03/02/2020 0938   ALT 8 03/02/2020 0938   BILITOT 0.6 03/02/2020 0938       RADIOGRAPHIC STUDIES:  No results found.   ASSESSMENT/PLAN:  This is a very pleasant 74 year old African-American female diagnosed with stage IV non-small cell lung cancer, squamous cell carcinoma. She presented with a right upper lobe lung mass and pleural based metastases and mediastinal lymphadenopathy.She was diagnosed in August 2020   The patientwas onsystemicchemotherapy with carboplatin for an AUC of 5, paclitaxel 175 mg/m, and Keytruda 200 mg IV every 3 weeks with Neulasta support.Starting from cycle #5, shewas onmaintenance with single agent Keytruda.She is status post18cyclesof treatment and she tolerated it wellwithout any concerning adverse effectsexcept for mild fatigue.This was discontinued secondary to evidence of disease progression.  The patient is currently undergoing palliative systemic chemotherapy with docetaxel 75 mg per metered squared and Cyramza 10 mg/kg IV every 3 weeks with Neulasta support. She is status post 3 cycles and she is tolerating this well.   The patient was supposed to have a restaging CT scan of the chest, abdomen, and pelvis prior to starting today's treatment.  This has not been scheduled until 03/15/2020.   Labs were reviewed.  Recommend that she proceed with cycle #4 today scheduled.  We will see the patient back for follow-up visit in 3 weeks for evaluation and to review her scan results before starting cycle #5.  The  patient was instructed to keep her appointment on 03/15/20 for her restaging CT scan.  Regarding her fall, I asked the patient if she needed a cane/walker at home. She declined. She believes she was trying to move too fast and lost her balance.   We will continue to take Tessalon and Robitussin as needed for her cough.  The patient was advised to call immediately if she has any concerning symptoms in the interval. The patient voices understanding of current disease status and treatment options and is in agreement with the current care plan. All questions were answered. The patient knows to call the clinic with any problems, questions or concerns. We can certainly see the patient much sooner if necessary      No orders of the defined types were placed in this encounter.    Chizaram Latino L Leeland Lovelady, PA-C 03/08/20

## 2020-03-08 ENCOUNTER — Inpatient Hospital Stay: Payer: Medicare HMO

## 2020-03-08 ENCOUNTER — Encounter: Payer: Self-pay | Admitting: Physician Assistant

## 2020-03-08 ENCOUNTER — Other Ambulatory Visit: Payer: Self-pay

## 2020-03-08 ENCOUNTER — Inpatient Hospital Stay (HOSPITAL_BASED_OUTPATIENT_CLINIC_OR_DEPARTMENT_OTHER): Payer: Medicare HMO | Admitting: Physician Assistant

## 2020-03-08 VITALS — BP 125/83 | HR 89 | Temp 98.2°F | Resp 18 | Ht 61.0 in | Wt 141.7 lb

## 2020-03-08 DIAGNOSIS — C3491 Malignant neoplasm of unspecified part of right bronchus or lung: Secondary | ICD-10-CM | POA: Diagnosis not present

## 2020-03-08 DIAGNOSIS — Z5111 Encounter for antineoplastic chemotherapy: Secondary | ICD-10-CM

## 2020-03-08 DIAGNOSIS — Z95828 Presence of other vascular implants and grafts: Secondary | ICD-10-CM

## 2020-03-08 DIAGNOSIS — Z5112 Encounter for antineoplastic immunotherapy: Secondary | ICD-10-CM | POA: Diagnosis not present

## 2020-03-08 LAB — CBC WITH DIFFERENTIAL (CANCER CENTER ONLY)
Abs Immature Granulocytes: 0.04 10*3/uL (ref 0.00–0.07)
Basophils Absolute: 0 10*3/uL (ref 0.0–0.1)
Basophils Relative: 0 %
Eosinophils Absolute: 0 10*3/uL (ref 0.0–0.5)
Eosinophils Relative: 0 %
HCT: 29.3 % — ABNORMAL LOW (ref 36.0–46.0)
Hemoglobin: 9.9 g/dL — ABNORMAL LOW (ref 12.0–15.0)
Immature Granulocytes: 0 %
Lymphocytes Relative: 15 %
Lymphs Abs: 1.4 10*3/uL (ref 0.7–4.0)
MCH: 32.5 pg (ref 26.0–34.0)
MCHC: 33.8 g/dL (ref 30.0–36.0)
MCV: 96.1 fL (ref 80.0–100.0)
Monocytes Absolute: 0.5 10*3/uL (ref 0.1–1.0)
Monocytes Relative: 6 %
Neutro Abs: 7.2 10*3/uL (ref 1.7–7.7)
Neutrophils Relative %: 79 %
Platelet Count: 267 10*3/uL (ref 150–400)
RBC: 3.05 MIL/uL — ABNORMAL LOW (ref 3.87–5.11)
RDW: 17.3 % — ABNORMAL HIGH (ref 11.5–15.5)
WBC Count: 9.1 10*3/uL (ref 4.0–10.5)
nRBC: 0 % (ref 0.0–0.2)

## 2020-03-08 LAB — CMP (CANCER CENTER ONLY)
ALT: 13 U/L (ref 0–44)
AST: 14 U/L — ABNORMAL LOW (ref 15–41)
Albumin: 4 g/dL (ref 3.5–5.0)
Alkaline Phosphatase: 59 U/L (ref 38–126)
Anion gap: 7 (ref 5–15)
BUN: 30 mg/dL — ABNORMAL HIGH (ref 8–23)
CO2: 30 mmol/L (ref 22–32)
Calcium: 10.2 mg/dL (ref 8.9–10.3)
Chloride: 102 mmol/L (ref 98–111)
Creatinine: 1.09 mg/dL — ABNORMAL HIGH (ref 0.44–1.00)
GFR, Estimated: 54 mL/min — ABNORMAL LOW (ref >60)
Glucose, Bld: 113 mg/dL — ABNORMAL HIGH (ref 70–99)
Potassium: 3.4 mmol/L — ABNORMAL LOW (ref 3.5–5.1)
Sodium: 139 mmol/L (ref 135–145)
Total Bilirubin: 0.7 mg/dL (ref 0.3–1.2)
Total Protein: 7.5 g/dL (ref 6.5–8.1)

## 2020-03-08 LAB — TOTAL PROTEIN, URINE DIPSTICK: Protein, ur: NEGATIVE mg/dL

## 2020-03-08 MED ORDER — SODIUM CHLORIDE 0.9 % IV SOLN
75.0000 mg/m2 | Freq: Once | INTRAVENOUS | Status: AC
  Administered 2020-03-08: 120 mg via INTRAVENOUS
  Filled 2020-03-08: qty 12

## 2020-03-08 MED ORDER — SODIUM CHLORIDE 0.9% FLUSH
10.0000 mL | INTRAVENOUS | Status: DC | PRN
  Administered 2020-03-08: 10 mL
  Filled 2020-03-08: qty 10

## 2020-03-08 MED ORDER — DIPHENHYDRAMINE HCL 50 MG/ML IJ SOLN
INTRAMUSCULAR | Status: AC
  Filled 2020-03-08: qty 1

## 2020-03-08 MED ORDER — ACETAMINOPHEN 325 MG PO TABS
ORAL_TABLET | ORAL | Status: AC
  Filled 2020-03-08: qty 2

## 2020-03-08 MED ORDER — DEXAMETHASONE SODIUM PHOSPHATE 100 MG/10ML IJ SOLN
10.0000 mg | Freq: Once | INTRAMUSCULAR | Status: AC
  Administered 2020-03-08: 10 mg via INTRAVENOUS
  Filled 2020-03-08: qty 10

## 2020-03-08 MED ORDER — DIPHENHYDRAMINE HCL 50 MG/ML IJ SOLN
50.0000 mg | Freq: Once | INTRAMUSCULAR | Status: AC
  Administered 2020-03-08: 50 mg via INTRAVENOUS

## 2020-03-08 MED ORDER — HEPARIN SOD (PORK) LOCK FLUSH 100 UNIT/ML IV SOLN
500.0000 [IU] | Freq: Once | INTRAVENOUS | Status: AC | PRN
  Administered 2020-03-08: 500 [IU]
  Filled 2020-03-08: qty 5

## 2020-03-08 MED ORDER — SODIUM CHLORIDE 0.9 % IV SOLN
Freq: Once | INTRAVENOUS | Status: AC
  Filled 2020-03-08: qty 250

## 2020-03-08 MED ORDER — SODIUM CHLORIDE 0.9% FLUSH
10.0000 mL | INTRAVENOUS | Status: DC | PRN
Start: 2020-03-08 — End: 2020-03-08
  Administered 2020-03-08: 10 mL
  Filled 2020-03-08: qty 10

## 2020-03-08 MED ORDER — SODIUM CHLORIDE 0.9 % IV SOLN
10.0000 mg/kg | Freq: Once | INTRAVENOUS | Status: AC
  Administered 2020-03-08: 600 mg via INTRAVENOUS
  Filled 2020-03-08: qty 50

## 2020-03-08 MED ORDER — ACETAMINOPHEN 325 MG PO TABS
650.0000 mg | ORAL_TABLET | Freq: Once | ORAL | Status: AC
  Administered 2020-03-08: 650 mg via ORAL

## 2020-03-08 NOTE — Patient Instructions (Signed)
[facility] Discharge Instructions for Patients Receiving Chemotherapy  Today you received the following chemotherapy agents: Cyramza, Taxotere  To help prevent nausea and vomiting after your treatment, we encourage you to take your nausea medication as directed.    If you develop nausea and vomiting that is not controlled by your nausea medication, call the clinic.   BELOW ARE SYMPTOMS THAT SHOULD BE REPORTED IMMEDIATELY:  *FEVER GREATER THAN 100.5 F  *CHILLS WITH OR WITHOUT FEVER  NAUSEA AND VOMITING THAT IS NOT CONTROLLED WITH YOUR NAUSEA MEDICATION  *UNUSUAL SHORTNESS OF BREATH  *UNUSUAL BRUISING OR BLEEDING  TENDERNESS IN MOUTH AND THROAT WITH OR WITHOUT PRESENCE OF ULCERS  *URINARY PROBLEMS  *BOWEL PROBLEMS  UNUSUAL RASH Items with * indicate a potential emergency and should be followed up as soon as possible.  Feel free to call the clinic should you have any questions or concerns. The clinic phone number is [phone].  Please show the [facility] at check-in to the Emergency Department and triage nurse.

## 2020-03-10 ENCOUNTER — Other Ambulatory Visit: Payer: Self-pay

## 2020-03-10 ENCOUNTER — Inpatient Hospital Stay: Payer: Medicare HMO

## 2020-03-10 VITALS — BP 120/76 | HR 83 | Temp 99.0°F | Resp 16

## 2020-03-10 DIAGNOSIS — C3491 Malignant neoplasm of unspecified part of right bronchus or lung: Secondary | ICD-10-CM

## 2020-03-10 DIAGNOSIS — Z5112 Encounter for antineoplastic immunotherapy: Secondary | ICD-10-CM | POA: Diagnosis not present

## 2020-03-10 MED ORDER — PEGFILGRASTIM-JMDB 6 MG/0.6ML ~~LOC~~ SOSY
PREFILLED_SYRINGE | SUBCUTANEOUS | Status: AC
  Filled 2020-03-10: qty 0.6

## 2020-03-10 MED ORDER — PEGFILGRASTIM-JMDB 6 MG/0.6ML ~~LOC~~ SOSY
6.0000 mg | PREFILLED_SYRINGE | Freq: Once | SUBCUTANEOUS | Status: AC
  Administered 2020-03-10: 6 mg via SUBCUTANEOUS

## 2020-03-10 NOTE — Patient Instructions (Signed)

## 2020-03-14 ENCOUNTER — Other Ambulatory Visit: Payer: Self-pay | Admitting: Medical Oncology

## 2020-03-15 ENCOUNTER — Inpatient Hospital Stay: Payer: Medicare HMO

## 2020-03-15 ENCOUNTER — Ambulatory Visit (HOSPITAL_COMMUNITY)
Admission: RE | Admit: 2020-03-15 | Discharge: 2020-03-15 | Disposition: A | Discharge status: Home / Self Care | Payer: Medicare HMO | Source: Ambulatory Visit | Attending: Physician Assistant | Admitting: Physician Assistant

## 2020-03-15 ENCOUNTER — Telehealth: Payer: Self-pay | Admitting: Physician Assistant

## 2020-03-15 ENCOUNTER — Encounter (HOSPITAL_COMMUNITY): Payer: Self-pay

## 2020-03-15 ENCOUNTER — Other Ambulatory Visit: Payer: Self-pay

## 2020-03-15 ENCOUNTER — Other Ambulatory Visit: Payer: Self-pay | Admitting: Physician Assistant

## 2020-03-15 DIAGNOSIS — Z95828 Presence of other vascular implants and grafts: Secondary | ICD-10-CM

## 2020-03-15 DIAGNOSIS — C3491 Malignant neoplasm of unspecified part of right bronchus or lung: Secondary | ICD-10-CM

## 2020-03-15 DIAGNOSIS — Z5112 Encounter for antineoplastic immunotherapy: Secondary | ICD-10-CM | POA: Diagnosis not present

## 2020-03-15 LAB — CBC WITH DIFFERENTIAL (CANCER CENTER ONLY)
Abs Immature Granulocytes: 0.15 10*3/uL — ABNORMAL HIGH (ref 0.00–0.07)
Basophils Absolute: 0.1 10*3/uL (ref 0.0–0.1)
Basophils Relative: 2 %
Eosinophils Absolute: 0.1 10*3/uL (ref 0.0–0.5)
Eosinophils Relative: 2 %
HCT: 28.6 % — ABNORMAL LOW (ref 36.0–46.0)
Hemoglobin: 9.7 g/dL — ABNORMAL LOW (ref 12.0–15.0)
Immature Granulocytes: 3 %
Lymphocytes Relative: 38 %
Lymphs Abs: 1.7 10*3/uL (ref 0.7–4.0)
MCH: 32.9 pg (ref 26.0–34.0)
MCHC: 33.9 g/dL (ref 30.0–36.0)
MCV: 96.9 fL (ref 80.0–100.0)
Monocytes Absolute: 0.7 10*3/uL (ref 0.1–1.0)
Monocytes Relative: 16 %
Neutro Abs: 1.7 10*3/uL (ref 1.7–7.7)
Neutrophils Relative %: 39 %
Platelet Count: 117 10*3/uL — ABNORMAL LOW (ref 150–400)
RBC: 2.95 MIL/uL — ABNORMAL LOW (ref 3.87–5.11)
RDW: 17.3 % — ABNORMAL HIGH (ref 11.5–15.5)
WBC Count: 4.4 10*3/uL (ref 4.0–10.5)
nRBC: 0 % (ref 0.0–0.2)

## 2020-03-15 LAB — CMP (CANCER CENTER ONLY)
ALT: 12 U/L (ref 0–44)
AST: 16 U/L (ref 15–41)
Albumin: 3.5 g/dL (ref 3.5–5.0)
Alkaline Phosphatase: 77 U/L (ref 38–126)
Anion gap: 11 (ref 5–15)
BUN: 16 mg/dL (ref 8–23)
CO2: 27 mmol/L (ref 22–32)
Calcium: 9.3 mg/dL (ref 8.9–10.3)
Chloride: 98 mmol/L (ref 98–111)
Creatinine: 0.74 mg/dL (ref 0.44–1.00)
GFR, Estimated: >60 mL/min (ref >60)
Glucose, Bld: 80 mg/dL (ref 70–99)
Potassium: 3.4 mmol/L — ABNORMAL LOW (ref 3.5–5.1)
Sodium: 136 mmol/L (ref 135–145)
Total Bilirubin: 1.3 mg/dL — ABNORMAL HIGH (ref 0.3–1.2)
Total Protein: 6.6 g/dL (ref 6.5–8.1)

## 2020-03-15 LAB — MAGNESIUM: Magnesium: 1.5 mg/dL — ABNORMAL LOW (ref 1.7–2.4)

## 2020-03-15 MED ORDER — MAGNESIUM OXIDE 400 MG PO TABS: 1.0000 | ORAL_TABLET | Freq: Every day | ORAL | 1 refills | Status: DC

## 2020-03-15 MED ORDER — HEPARIN SOD (PORK) LOCK FLUSH 100 UNIT/ML IV SOLN
500.0000 [IU] | Freq: Once | INTRAVENOUS | Status: AC
  Administered 2020-03-15: 500 [IU] via INTRAVENOUS

## 2020-03-15 MED ORDER — SODIUM CHLORIDE 0.9% FLUSH
10.0000 mL | INTRAVENOUS | Status: DC | PRN
  Administered 2020-03-15: 10 mL
  Filled 2020-03-15: qty 10

## 2020-03-15 MED ORDER — IOHEXOL 300 MG/ML  SOLN
100.0000 mL | Freq: Once | INTRAMUSCULAR | Status: AC | PRN
  Administered 2020-03-15: 100 mL via INTRAVENOUS

## 2020-03-15 MED ORDER — IOHEXOL 9 MG/ML PO SOLN
ORAL | Status: AC
  Filled 2020-03-15: qty 1000

## 2020-03-15 MED ORDER — HEPARIN SOD (PORK) LOCK FLUSH 100 UNIT/ML IV SOLN
INTRAVENOUS | Status: AC
  Filled 2020-03-15: qty 5

## 2020-03-15 MED ORDER — IOHEXOL 9 MG/ML PO SOLN
500.0000 mL | ORAL | Status: AC
  Administered 2020-03-15: 1000 mL via ORAL

## 2020-03-15 MED ORDER — HEPARIN SOD (PORK) LOCK FLUSH 100 UNIT/ML IV SOLN
500.0000 [IU] | Freq: Once | INTRAVENOUS | Status: AC | PRN
  Administered 2020-03-15: 500 [IU]
  Filled 2020-03-15: qty 5

## 2020-03-15 NOTE — Telephone Encounter (Signed)
I called the patient to let her know that her CT scan looked stable/improved and that we would review it in more detail at her next appointment. The scan noted "proximal transverse colonic wall thickening is favored to be artifactual in the setting of underdistention. Correlate with any symptoms of colitis.". I spoke to the patient. She denies any unusual diarrhea. She states that she sometimes gets diarrhea "depending on what she eats". Denies abdominal pain. Denies blood in the stool. Reports if she develops diarrhea it is <2x per day. Advised the patient to call us if she develops any new or worsening symptoms in the interval.

## 2020-03-22 ENCOUNTER — Inpatient Hospital Stay: Payer: Medicare HMO

## 2020-03-22 ENCOUNTER — Other Ambulatory Visit: Payer: Self-pay

## 2020-03-22 DIAGNOSIS — Z95828 Presence of other vascular implants and grafts: Secondary | ICD-10-CM

## 2020-03-22 DIAGNOSIS — C3491 Malignant neoplasm of unspecified part of right bronchus or lung: Secondary | ICD-10-CM

## 2020-03-22 DIAGNOSIS — Z5112 Encounter for antineoplastic immunotherapy: Secondary | ICD-10-CM | POA: Diagnosis not present

## 2020-03-22 LAB — CBC WITH DIFFERENTIAL (CANCER CENTER ONLY)
Abs Immature Granulocytes: 0.08 10*3/uL — ABNORMAL HIGH (ref 0.00–0.07)
Basophils Absolute: 0 10*3/uL (ref 0.0–0.1)
Basophils Relative: 0 %
Eosinophils Absolute: 0 10*3/uL (ref 0.0–0.5)
Eosinophils Relative: 0 %
HCT: 30.2 % — ABNORMAL LOW (ref 36.0–46.0)
Hemoglobin: 10.3 g/dL — ABNORMAL LOW (ref 12.0–15.0)
Immature Granulocytes: 1 %
Lymphocytes Relative: 21 %
Lymphs Abs: 1.9 10*3/uL (ref 0.7–4.0)
MCH: 33.1 pg (ref 26.0–34.0)
MCHC: 34.1 g/dL (ref 30.0–36.0)
MCV: 97.1 fL (ref 80.0–100.0)
Monocytes Absolute: 0.5 10*3/uL (ref 0.1–1.0)
Monocytes Relative: 6 %
Neutro Abs: 6.6 10*3/uL (ref 1.7–7.7)
Neutrophils Relative %: 72 %
Platelet Count: 177 10*3/uL (ref 150–400)
RBC: 3.11 MIL/uL — ABNORMAL LOW (ref 3.87–5.11)
RDW: 18.4 % — ABNORMAL HIGH (ref 11.5–15.5)
WBC Count: 9.1 10*3/uL (ref 4.0–10.5)
nRBC: 0 % (ref 0.0–0.2)

## 2020-03-22 LAB — CMP (CANCER CENTER ONLY)
ALT: 9 U/L (ref 0–44)
AST: 13 U/L — ABNORMAL LOW (ref 15–41)
Albumin: 3.6 g/dL (ref 3.5–5.0)
Alkaline Phosphatase: 83 U/L (ref 38–126)
Anion gap: 6 (ref 5–15)
BUN: 15 mg/dL (ref 8–23)
CO2: 32 mmol/L (ref 22–32)
Calcium: 9.8 mg/dL (ref 8.9–10.3)
Chloride: 102 mmol/L (ref 98–111)
Creatinine: 0.87 mg/dL (ref 0.44–1.00)
GFR, Estimated: >60 mL/min (ref >60)
Glucose, Bld: 86 mg/dL (ref 70–99)
Potassium: 3.5 mmol/L (ref 3.5–5.1)
Sodium: 140 mmol/L (ref 135–145)
Total Bilirubin: 0.6 mg/dL (ref 0.3–1.2)
Total Protein: 7 g/dL (ref 6.5–8.1)

## 2020-03-22 MED ORDER — SODIUM CHLORIDE 0.9% FLUSH
10.0000 mL | INTRAVENOUS | Status: DC | PRN
  Administered 2020-03-22: 10 mL
  Filled 2020-03-22: qty 10

## 2020-03-22 MED ORDER — HEPARIN SOD (PORK) LOCK FLUSH 100 UNIT/ML IV SOLN
500.0000 [IU] | Freq: Once | INTRAVENOUS | Status: AC | PRN
  Administered 2020-03-22: 500 [IU]
  Filled 2020-03-22: qty 5

## 2020-03-22 NOTE — Patient Instructions (Signed)

## 2020-03-27 NOTE — Progress Notes (Unsigned)
[facility] OFFICE PROGRESS NOTE  [name], MD [address] [place] [address]  DIAGNOSIS: Stage IV non-small cell lung cancer, squamous cell carcinoma. She presented withright upper lobe lung mass in addition to pleural-based metastasis andmediastinal lymphadenopathy.She was diagnosed in September 2020.  PRIOR THERAPY: Chemotherapy with carboplatin for an AUC of 5,paclitaxel 175 mg/m, and Keytruda 200 mg IV every 3 weeks with Neulasta support.Last dose9/15/21.Status post18cycles. Starting from cycle #5wason maintenance treatment with single agent Keytruda every 3 weeks.This was discontinued due to evidence of disease progression.  CURRENT THERAPY: Palliative systemic chemotherapy with docetaxel 75 mg per metered squared and Cyramza 10 mg/kg IV every 3 weeks with Neulasta support. First dose on 01/04/20. Status post 4 cycles.  INTERVAL HISTORY: Debra Rodgers 75 y.o. female returns to the clinic today for a follow-up visit. The patient is feeling fair well today without any concerning complaints except for she had a mild cold which started on Friday. She started feeling fatigued and had some nasal congestion. She uses mucinex. The roof of her mouth is sore. It appears she has an ulceration there. She also developed a mild cough for which she has robitussin. She denies shortness of breath or fevers. She has been vaccinated for COVID-19. She denies any sick contacts. She states her symptoms are improving.   The patient's restaging CT scan was performed shortly after her last visit. The scan noted "proximal transverse colonic wall thickening is favored to be artifactual in the setting of underdistention. Correlate with any symptoms of colitis." The patient denies any unusual diarrhea. She states that she occasionally has loose stools that "depends on what she eats". She denies any abdominal pain. Denies any blood in the stool.   She reports  intermittent night sweats which is her baseline. She reports her appetite comes and goes. She tries to drink ensure 1 per day. She lost about 5  lbs since her last appointment. She has been seen by nutrition in the past.  She denies any hemoptysis.  She has been endorsing an achy intermittent left lateral rib pain for which she has been taking Tylenol if needed. She has had this pain intermittently for over a year. No clear etiology has been seen on imaging studies. She denies any nausea, vomiting, or constipation. The patient is here today for evaluation and to review her scan results before starting cycle #5 of her treatment.  MEDICAL HISTORY: Past Medical History:  Diagnosis Date  . COPD (chronic obstructive pulmonary disease) ([place])   . Depression   . Essential hypertension   . Headache   . History of migraine headaches   . lung ca dx'd 10/2018  . Sleep apnea   . Tobacco use disorder     ALLERGIES:  is allergic to codeine sulfate and pantoprazole sodium.  MEDICATIONS:  Current Outpatient Medications  Medication Sig Dispense Refill  . albuterol (VENTOLIN HFA) 108 (90 Base) MCG/ACT inhaler Inhale 2 puffs into the lungs every 6 (six) hours as needed for wheezing or shortness of breath. 36 g 2  . amLODipine (NORVASC) 10 MG tablet Take 1 tablet (10 mg total) by mouth daily. 90 tablet 3  . atorvastatin (LIPITOR) 20 MG tablet Take 1 tablet (20 mg total) by mouth daily. 90 tablet 3  . benzonatate (TESSALON) 100 MG capsule TAKE 1 CAPSULE EVERY 8 HOURS AS NEEDED FOR COUGH 90 capsule 0  . chlorthalidone (HYGROTON) 50 MG tablet TAKE 1 TABLET EVERY DAY 90 tablet 3  .  dexamethasone (DECADRON) 4 MG tablet Please take TWO tablets TWICE a day the day before, the day of, and the day after chemotherapy 120 tablet 2  . famotidine (PEPCID) 20 MG tablet Take 20 mg by mouth daily.     Marland Kitchen FLUoxetine (PROZAC) 20 MG capsule TAKE 1 CAPSULE EVERY DAY 90 capsule 3  . fluticasone (FLONASE) 50 MCG/ACT nasal spray  Place 1 spray into both nostrils daily. 11.1 mL 0  . HYDROcodone-acetaminophen (NORCO) 7.5-325 MG tablet Take 1 tablet by mouth every 6 (six) hours as needed for moderate pain. 60 tablet 0  . lidocaine-prilocaine (EMLA) cream Apply 1 application topically as needed. 30 g 0  . magnesium oxide (MAG-OX) 400 MG tablet Take 1 tablet (400 mg total) by mouth daily. 30 tablet 1  . polyvinyl alcohol (LIQUIFILM TEARS) 1.4 % ophthalmic solution Place 1 drop into both eyes as needed for dry eyes.    . potassium chloride SA (KLOR-CON M20) 20 MEQ tablet Take 1 tablet (20 mEq total) by mouth daily. 6 tablet 0  . tiotropium (SPIRIVA HANDIHALER) 18 MCG inhalation capsule Place 1 capsule (18 mcg total) into inhaler and inhale daily. (Patient not taking: Reported on 03/08/2020) 30 capsule 2   No current facility-administered medications for this visit.    SURGICAL HISTORY:  Past Surgical History:  Procedure Laterality Date  . ABDOMINAL HYSTERECTOMY    . CHOLECYSTECTOMY    . IR IMAGING GUIDED PORT INSERTION  02/24/2019  . ROTATOR CUFF REPAIR  3/04  . VIDEO BRONCHOSCOPY WITH ENDOBRONCHIAL ULTRASOUND Right 11/25/2018   Procedure: VIDEO BRONCHOSCOPY WITH ENDOBRONCHIAL ULTRASOUND;  Surgeon: Collene Gobble, MD;  Location: [facility] OR;  Service: Thoracic;  Laterality: Right;    REVIEW OF SYSTEMS:   Review of Systems  Constitutional: Positive for fatigue, decreased appetite, and weight loss. Negative for chills and fever HENT: Positive for mouth sore and nasal congestion. Negative for nosebleeds, sore throat and trouble swallowing.   Eyes: Negative for eye problems and icterus.  Respiratory: Positive for mild cough and baseline dyspnea on exertion. Negative for hemoptysisand wheezing.   Cardiovascular: Negative for chest pain and leg swelling.  Gastrointestinal:Positive forbaseline intermittentdiarrhea(controlled).Negative for abdominal pain, constipation, nausea and vomiting. Genitourinary: Negative for bladder  incontinence, difficulty urinating, dysuria, frequency and hematuria.   Musculoskeletal: Negative for back pain, gait problem, neck pain and neck stiffness.  Skin: Negative for itching and rash.  Neurological: Negative for dizziness, extremity weakness, gait problem, headaches, light-headedness and seizures.  Hematological: Negative for adenopathy. Does not bruise/bleed easily.  Psychiatric/Behavioral: Negative for confusion, depression and sleep disturbance. The patient is not nervous/anxious.     PHYSICAL EXAMINATION:  Blood pressure 125/82, pulse 98, temperature 97.7 F (36.5 C), temperature source Tympanic, resp. rate 18, height 5' 1"  (1.549 m), weight 136 lb 9.6 oz (62 kg), SpO2 100 %.  ECOG PERFORMANCE STATUS: 1 - Symptomatic but completely ambulatory  Physical Exam  Constitutional: Oriented to person, place, and time and well-developed, well-nourished, and in no distress.  HENT:  Head: Normocephalic and atraumatic.  Mouth/Throat: Oropharynx is clear and moist. No oropharyngeal exudate. Single ulceration on right roof of her mouth. Eyes: Conjunctivae are normal. Right eye exhibits no discharge. Left eye exhibits no discharge. No scleral icterus.  Neck: Normal range of motion. Neck supple.  Cardiovascular: Normal rate, regular rhythm, normal heart sounds and intact distal pulses.  Pulmonary/Chest: Effort normal and breath sounds normal. No respiratory distress. No wheezes. No rales.  Abdominal: Soft. Bowel sounds are normal. Exhibits no distension  and no mass. There is no tenderness.  Musculoskeletal: Tenderness to palpation on the lateral left chest wall.Normal range of motion. Exhibits no edema.  Lymphadenopathy:  No cervical adenopathy.  Neurological: Alert and oriented to person, place, and time. Exhibits normal muscle tone. Gait normal. Coordination normal.  Skin: Skin is warm and dry. No rash noted. Not diaphoretic. No erythema. No pallor.  Psychiatric: Mood, memory and  judgment normal.  Vitals reviewed.  LABORATORY DATA: Lab Results  Component Value Date   WBC 3.4 (L) 03/29/2020   HGB 10.8 (L) 03/29/2020   HCT 31.5 (L) 03/29/2020   MCV 95.2 03/29/2020   PLT 255 03/29/2020      Chemistry      Component Value Date/Time   NA 137 03/29/2020 1112   NA 139 07/21/2017 0958   K 3.5 03/29/2020 1112   CL 99 03/29/2020 1112   CO2 25 03/29/2020 1112   BUN 38 (H) 03/29/2020 1112   BUN 26 07/21/2017 0958   CREATININE 1.26 (H) 03/29/2020 1112   CREATININE 0.79 09/03/2013 1540      Component Value Date/Time   CALCIUM 9.8 03/29/2020 1112   ALKPHOS 59 03/29/2020 1112   AST 14 (L) 03/29/2020 1112   ALT 10 03/29/2020 1112   BILITOT 0.6 03/29/2020 1112       RADIOGRAPHIC STUDIES:  CT Chest W Contrast  Result Date: 03/15/2020 CLINICAL DATA:  Primary Cancer Type: Lung Imaging Indication: Assess response to therapy Interval therapy since last imaging? Yes Initial Cancer Diagnosis Date: 11/25/2018; Established by: Biopsy-proven Detailed Pathology: Stage IV non-small cell lung cancer, squamous cell carcinoma. Primary Tumor location: Right upper lobe. Surgeries: Hysterectomy, cholecystectomy. Chemotherapy: Yes; Ongoing? Yes; Most recent administration: 03/08/2020 Immunotherapy?  Yes; Type: Keytruda; Ongoing? No Radiation therapy? No EXAM: CT CHEST, ABDOMEN, AND PELVIS WITH CONTRAST TECHNIQUE: Multidetector CT imaging of the chest, abdomen and pelvis was performed following the standard protocol during bolus administration of intravenous contrast. CONTRAST:  139m OMNIPAQUE IOHEXOL 300 MG/ML  SOLN COMPARISON:  Most recent CT chest, abdomen and pelvis 12/27/2019. 11/18/2018 PET-CT. FINDINGS: CT CHEST FINDINGS Cardiovascular: Right Port-A-Cath tip high right atrium. Aortic and branch vessel atherosclerosis. Upper normal ascending aortic size, 3.7 cm. Tortuous thoracic aorta. Extensive ulcerative plaque in the descending thoracic aorta just above the diaphragmatic hiatus  including on 44/2. Mild cardiomegaly, accentuated by a mild pectus excavatum deformity. Lad coronary artery calcification. Pulmonary artery enlargement, outflow tract 3.3 cm. No central pulmonary embolism, on this non-dedicated study. Mediastinum/Nodes: No supraclavicular adenopathy. Right paratracheal node measures 9 mm on 24/2 versus 11 mm on the prior exam (when remeasured). Resolution of right hilar adenopathy. Esophageal fluid level on 28/2. Lungs/Pleura: No pleural fluid. Moderate centrilobular and paraseptal emphysema. Index posterior right upper lobe pulmonary nodule is decreased, on the order of 5 mm on 58/6 versus 9 mm on the prior exam. Near complete resolution of an index superior segment left lower lobe pulmonary nodule. On the order of 4 mm on 47/6 versus 8 mm on the prior exam. Other multiple pulmonary nodules are improved and resolved. No enlarging pulmonary nodules.  Left hemidiaphragm elevation. Musculoskeletal: No acute osseous abnormality. Cervical spine fixation. Thoracic spondylosis. CT ABDOMEN PELVIS FINDINGS Hepatobiliary: Right hepatic lobe cysts on the order of 8 mm and less. Other hepatic lesions which are too small to characterize, but similar and most likely benign. Cholecystectomy, without biliary ductal dilatation. Pancreas: Normal, without mass or ductal dilatation. Spleen: Normal in size, without focal abnormality. Adrenals/Urinary Tract: Normal adrenal glands. Normal  kidneys, without hydronephrosis. Normal urinary bladder. Stomach/Bowel: Normal stomach, without wall thickening. Proximal transverse colonic wall thickening including on 73/2 is favored to be artifactual in the setting of underdistention. No pericolonic edema. Normal terminal ileum.  Normal small bowel. Vascular/Lymphatic: Aortic atherosclerosis. Abdominal aortic dilatation at on the order of 3.8 x 4.0 cm on 61/2, similar to 4.0 x 3.7 cm on the prior. No surrounding hemorrhage. No abdominopelvic adenopathy.  Reproductive: Hysterectomy. Left ovarian simple cystic lesion is similar at 7.0 x 6.6 cm. Other: No significant free fluid. No evidence of omental or peritoneal disease. Musculoskeletal: Lumbosacral spondylosis. S shaped thoracolumbar spine curvature. IMPRESSION: 1. Response to therapy of pulmonary nodules and thoracic adenopathy. 2. No new or progressive disease. 3. No acute process or evidence of metastatic disease in the abdomen or pelvis. 4. Esophageal air fluid level suggests dysmotility or gastroesophageal reflux. 5. Proximal transverse colonic wall thickening is favored to be artifactual in the setting of underdistention. Correlate with any symptoms of colitis. 6. Aortic atherosclerosis (ICD10-I70.0), coronary artery atherosclerosis and emphysema (ICD10-J43.9). 7. Similar borderline ascending thoracic, mild descending thoracic, and mild abdominal aortic dilatation. Electronically Signed   By: Abigail Miyamoto M.D.   On: 03/15/2020 11:08   CT Abdomen Pelvis W Contrast  Result Date: 03/15/2020 CLINICAL DATA:  Primary Cancer Type: Lung Imaging Indication: Assess response to therapy Interval therapy since last imaging? Yes Initial Cancer Diagnosis Date: 11/25/2018; Established by: Biopsy-proven Detailed Pathology: Stage IV non-small cell lung cancer, squamous cell carcinoma. Primary Tumor location: Right upper lobe. Surgeries: Hysterectomy, cholecystectomy. Chemotherapy: Yes; Ongoing? Yes; Most recent administration: 03/08/2020 Immunotherapy?  Yes; Type: Keytruda; Ongoing? No Radiation therapy? No EXAM: CT CHEST, ABDOMEN, AND PELVIS WITH CONTRAST TECHNIQUE: Multidetector CT imaging of the chest, abdomen and pelvis was performed following the standard protocol during bolus administration of intravenous contrast. CONTRAST:  173m OMNIPAQUE IOHEXOL 300 MG/ML  SOLN COMPARISON:  Most recent CT chest, abdomen and pelvis 12/27/2019. 11/18/2018 PET-CT. FINDINGS: CT CHEST FINDINGS Cardiovascular: Right Port-A-Cath tip  high right atrium. Aortic and branch vessel atherosclerosis. Upper normal ascending aortic size, 3.7 cm. Tortuous thoracic aorta. Extensive ulcerative plaque in the descending thoracic aorta just above the diaphragmatic hiatus including on 44/2. Mild cardiomegaly, accentuated by a mild pectus excavatum deformity. Lad coronary artery calcification. Pulmonary artery enlargement, outflow tract 3.3 cm. No central pulmonary embolism, on this non-dedicated study. Mediastinum/Nodes: No supraclavicular adenopathy. Right paratracheal node measures 9 mm on 24/2 versus 11 mm on the prior exam (when remeasured). Resolution of right hilar adenopathy. Esophageal fluid level on 28/2. Lungs/Pleura: No pleural fluid. Moderate centrilobular and paraseptal emphysema. Index posterior right upper lobe pulmonary nodule is decreased, on the order of 5 mm on 58/6 versus 9 mm on the prior exam. Near complete resolution of an index superior segment left lower lobe pulmonary nodule. On the order of 4 mm on 47/6 versus 8 mm on the prior exam. Other multiple pulmonary nodules are improved and resolved. No enlarging pulmonary nodules.  Left hemidiaphragm elevation. Musculoskeletal: No acute osseous abnormality. Cervical spine fixation. Thoracic spondylosis. CT ABDOMEN PELVIS FINDINGS Hepatobiliary: Right hepatic lobe cysts on the order of 8 mm and less. Other hepatic lesions which are too small to characterize, but similar and most likely benign. Cholecystectomy, without biliary ductal dilatation. Pancreas: Normal, without mass or ductal dilatation. Spleen: Normal in size, without focal abnormality. Adrenals/Urinary Tract: Normal adrenal glands. Normal kidneys, without hydronephrosis. Normal urinary bladder. Stomach/Bowel: Normal stomach, without wall thickening. Proximal transverse colonic wall thickening including on 73/2  is favored to be artifactual in the setting of underdistention. No pericolonic edema. Normal terminal ileum.  Normal small  bowel. Vascular/Lymphatic: Aortic atherosclerosis. Abdominal aortic dilatation at on the order of 3.8 x 4.0 cm on 61/2, similar to 4.0 x 3.7 cm on the prior. No surrounding hemorrhage. No abdominopelvic adenopathy. Reproductive: Hysterectomy. Left ovarian simple cystic lesion is similar at 7.0 x 6.6 cm. Other: No significant free fluid. No evidence of omental or peritoneal disease. Musculoskeletal: Lumbosacral spondylosis. S shaped thoracolumbar spine curvature. IMPRESSION: 1. Response to therapy of pulmonary nodules and thoracic adenopathy. 2. No new or progressive disease. 3. No acute process or evidence of metastatic disease in the abdomen or pelvis. 4. Esophageal air fluid level suggests dysmotility or gastroesophageal reflux. 5. Proximal transverse colonic wall thickening is favored to be artifactual in the setting of underdistention. Correlate with any symptoms of colitis. 6. Aortic atherosclerosis (ICD10-I70.0), coronary artery atherosclerosis and emphysema (ICD10-J43.9). 7. Similar borderline ascending thoracic, mild descending thoracic, and mild abdominal aortic dilatation. Electronically Signed   By: Abigail Miyamoto M.D.   On: 03/15/2020 11:08     ASSESSMENT/PLAN:  This is a very pleasant 75 year old African-American female diagnosed with stage IV non-small cell lung cancer, squamous cell carcinoma. She presented with a right upper lobe lung mass and pleural based metastases and mediastinal lymphadenopathy.She was diagnosed in August 2020   The patientwas onsystemicchemotherapy with carboplatin for an AUC of 5, paclitaxel 175 mg/m, and Keytruda 200 mg IV every 3 weeks with Neulasta support.Starting from cycle #5, shewas onmaintenance with single agent Keytruda.She is status post18cyclesof treatment and she tolerated it wellwithout any concerning adverse effectsexcept for mild fatigue.This was discontinued secondary to evidence of disease progression.  The patient is currently  undergoing palliative systemic chemotherapy with docetaxel 75 mg per metered squared and Cyramza 10 mg/kg IV every 3 weeks with Neulasta support. She is status post4 cycles and she is tolerating this well.   The patient recently had a restaging CT scan performed. Dr. Julien Nordmann personally and independently reviewed the scan discussed the results with the patient today. The scan should not show any evidence of disease progression. Dr. Julien Nordmann recommends the patient proceed with cycle #5 today scheduled.  We will see her back for follow-up visit in 3 weeks for evaluation before starting cycle #6.  I reviewed her symptoms with Dr. Julien Nordmann who recommended symptomatic treatment. She was encouraged to continue to use tessalon and robitussin for her cough. She will continue to use mucinex for congestion. She will use salt water rinses for her mouth sore. She was advised to please call us back for further instructions if she develops new or worsening symptoms such as fevers, chills, worsening cough, shortness of breath, etc. She states her symptoms are improving since Friday when it started.   I refilled her magnesium today. It was drawn on 03/15/20 and was a little low.   The patient was advised to call immediately if she has any concerning symptoms in the interval. The patient voices understanding of current disease status and treatment options and is in agreement with the current care plan. All questions were answered. The patient knows to call the clinic with any problems, questions or concerns. We can certainly see the patient much sooner if necessary  No orders of the defined types were placed in this encounter.    Kyndell Zeiser L Cristobal Advani, PA-C 03/29/20  ADDENDUM: Hematology/Oncology Attending: I had a face-to-face encounter with the patient today.  I recommended her care plan.  This is a very pleasant 75 years old African-American female with metastatic non-small cell lung cancer, squamous cell  carcinoma diagnosed in August 2020 status post systemic chemotherapy with carboplatin, paclitaxel and Keytruda for 4 cycles followed by maintenance Keytruda for a total of 14 cycles discontinued secondary to disease progression. The patient is currently undergoing second line systemic chemotherapy with docetaxel and Cyramza status post 4 cycles.  She has been tolerating her treatment fairly well with no concerning adverse effects except for mild fatigue. She had repeat CT scan of the chest, abdomen pelvis performed recently.  I personally and independently reviewed the scans and discussed the results with the patient today. Her scan showed improvement of her disease with no concerning findings for progression. I recommended for the patient to continue her current treatment with the same regimen and she will proceed with cycle #5 today. The patient will come back for follow-up visit in 3 weeks for evaluation before starting cycle #6. She was advised to call immediately if she has any other concerning symptoms in the interval.  Disclaimer: This note was dictated with voice recognition software. Similar sounding words can inadvertently be transcribed and may be missed upon review. Eilleen Kempf, MD 03/29/20

## 2020-03-29 ENCOUNTER — Inpatient Hospital Stay: Payer: Medicare HMO | Attending: Physician Assistant | Admitting: Physician Assistant

## 2020-03-29 ENCOUNTER — Inpatient Hospital Stay: Payer: Medicare HMO

## 2020-03-29 ENCOUNTER — Other Ambulatory Visit: Payer: Self-pay

## 2020-03-29 VITALS — BP 125/82 | HR 98 | Temp 97.7°F | Resp 18 | Ht 61.0 in | Wt 136.6 lb

## 2020-03-29 DIAGNOSIS — F329 Major depressive disorder, single episode, unspecified: Secondary | ICD-10-CM | POA: Insufficient documentation

## 2020-03-29 DIAGNOSIS — C3491 Malignant neoplasm of unspecified part of right bronchus or lung: Secondary | ICD-10-CM | POA: Diagnosis not present

## 2020-03-29 DIAGNOSIS — Z79899 Other long term (current) drug therapy: Secondary | ICD-10-CM | POA: Insufficient documentation

## 2020-03-29 DIAGNOSIS — Z5112 Encounter for antineoplastic immunotherapy: Secondary | ICD-10-CM | POA: Insufficient documentation

## 2020-03-29 DIAGNOSIS — Z452 Encounter for adjustment and management of vascular access device: Secondary | ICD-10-CM | POA: Insufficient documentation

## 2020-03-29 DIAGNOSIS — E860 Dehydration: Secondary | ICD-10-CM | POA: Insufficient documentation

## 2020-03-29 DIAGNOSIS — J449 Chronic obstructive pulmonary disease, unspecified: Secondary | ICD-10-CM | POA: Diagnosis not present

## 2020-03-29 DIAGNOSIS — R61 Generalized hyperhidrosis: Secondary | ICD-10-CM | POA: Diagnosis not present

## 2020-03-29 DIAGNOSIS — I2510 Atherosclerotic heart disease of native coronary artery without angina pectoris: Secondary | ICD-10-CM | POA: Insufficient documentation

## 2020-03-29 DIAGNOSIS — C3411 Malignant neoplasm of upper lobe, right bronchus or lung: Secondary | ICD-10-CM | POA: Diagnosis not present

## 2020-03-29 DIAGNOSIS — G4730 Sleep apnea, unspecified: Secondary | ICD-10-CM | POA: Diagnosis not present

## 2020-03-29 DIAGNOSIS — Z5111 Encounter for antineoplastic chemotherapy: Secondary | ICD-10-CM | POA: Diagnosis not present

## 2020-03-29 DIAGNOSIS — C782 Secondary malignant neoplasm of pleura: Secondary | ICD-10-CM | POA: Diagnosis not present

## 2020-03-29 DIAGNOSIS — I10 Essential (primary) hypertension: Secondary | ICD-10-CM | POA: Diagnosis not present

## 2020-03-29 DIAGNOSIS — I700 Atherosclerosis of aorta: Secondary | ICD-10-CM | POA: Insufficient documentation

## 2020-03-29 DIAGNOSIS — Z95828 Presence of other vascular implants and grafts: Secondary | ICD-10-CM

## 2020-03-29 LAB — CBC WITH DIFFERENTIAL (CANCER CENTER ONLY)
Abs Immature Granulocytes: 0.01 10*3/uL (ref 0.00–0.07)
Basophils Absolute: 0 10*3/uL (ref 0.0–0.1)
Basophils Relative: 0 %
Eosinophils Absolute: 0 10*3/uL (ref 0.0–0.5)
Eosinophils Relative: 0 %
HCT: 31.5 % — ABNORMAL LOW (ref 36.0–46.0)
Hemoglobin: 10.8 g/dL — ABNORMAL LOW (ref 12.0–15.0)
Immature Granulocytes: 0 %
Lymphocytes Relative: 25 %
Lymphs Abs: 0.8 10*3/uL (ref 0.7–4.0)
MCH: 32.6 pg (ref 26.0–34.0)
MCHC: 34.3 g/dL (ref 30.0–36.0)
MCV: 95.2 fL (ref 80.0–100.0)
Monocytes Absolute: 0.3 10*3/uL (ref 0.1–1.0)
Monocytes Relative: 8 %
Neutro Abs: 2.3 10*3/uL (ref 1.7–7.7)
Neutrophils Relative %: 67 %
Platelet Count: 255 10*3/uL (ref 150–400)
RBC: 3.31 MIL/uL — ABNORMAL LOW (ref 3.87–5.11)
RDW: 17.7 % — ABNORMAL HIGH (ref 11.5–15.5)
WBC Count: 3.4 10*3/uL — ABNORMAL LOW (ref 4.0–10.5)
nRBC: 0 % (ref 0.0–0.2)

## 2020-03-29 LAB — CMP (CANCER CENTER ONLY)
ALT: 10 U/L (ref 0–44)
AST: 14 U/L — ABNORMAL LOW (ref 15–41)
Albumin: 3.7 g/dL (ref 3.5–5.0)
Alkaline Phosphatase: 59 U/L (ref 38–126)
Anion gap: 13 (ref 5–15)
BUN: 38 mg/dL — ABNORMAL HIGH (ref 8–23)
CO2: 25 mmol/L (ref 22–32)
Calcium: 9.8 mg/dL (ref 8.9–10.3)
Chloride: 99 mmol/L (ref 98–111)
Creatinine: 1.26 mg/dL — ABNORMAL HIGH (ref 0.44–1.00)
GFR, Estimated: 45 mL/min — ABNORMAL LOW (ref >60)
Glucose, Bld: 120 mg/dL — ABNORMAL HIGH (ref 70–99)
Potassium: 3.5 mmol/L (ref 3.5–5.1)
Sodium: 137 mmol/L (ref 135–145)
Total Bilirubin: 0.6 mg/dL (ref 0.3–1.2)
Total Protein: 7.3 g/dL (ref 6.5–8.1)

## 2020-03-29 LAB — TOTAL PROTEIN, URINE DIPSTICK

## 2020-03-29 MED ORDER — SODIUM CHLORIDE 0.9% FLUSH
10.0000 mL | INTRAVENOUS | Status: DC | PRN
  Administered 2020-03-29: 10 mL
  Filled 2020-03-29: qty 10

## 2020-03-29 MED ORDER — SODIUM CHLORIDE 0.9 % IV SOLN
75.0000 mg/m2 | Freq: Once | INTRAVENOUS | Status: AC
  Administered 2020-03-29: 120 mg via INTRAVENOUS
  Filled 2020-03-29: qty 12

## 2020-03-29 MED ORDER — SODIUM CHLORIDE 0.9 % IV SOLN
Freq: Once | INTRAVENOUS | Status: AC
  Filled 2020-03-29: qty 250

## 2020-03-29 MED ORDER — HEPARIN SOD (PORK) LOCK FLUSH 100 UNIT/ML IV SOLN
500.0000 [IU] | Freq: Once | INTRAVENOUS | Status: AC | PRN
  Administered 2020-03-29: 500 [IU]
  Filled 2020-03-29: qty 5

## 2020-03-29 MED ORDER — MAGNESIUM OXIDE 400 MG PO TABS: 1.0000 | ORAL_TABLET | Freq: Every day | ORAL | 1 refills | Status: DC

## 2020-03-29 MED ORDER — RAMUCIRUMAB CHEMO INJECTION 100 MG/10ML
10.0000 mg/kg | Freq: Once | INTRAVENOUS | Status: AC
  Administered 2020-03-29: 600 mg via INTRAVENOUS
  Filled 2020-03-29: qty 50

## 2020-03-29 MED ORDER — DIPHENHYDRAMINE HCL 50 MG/ML IJ SOLN
50.0000 mg | Freq: Once | INTRAMUSCULAR | Status: AC
  Administered 2020-03-29: 50 mg via INTRAVENOUS

## 2020-03-29 MED ORDER — ACETAMINOPHEN 325 MG PO TABS
ORAL_TABLET | ORAL | Status: AC
  Filled 2020-03-29: qty 2

## 2020-03-29 MED ORDER — SODIUM CHLORIDE 0.9 % IV SOLN
10.0000 mg | Freq: Once | INTRAVENOUS | Status: AC
  Administered 2020-03-29: 10 mg via INTRAVENOUS
  Filled 2020-03-29: qty 10

## 2020-03-29 MED ORDER — DIPHENHYDRAMINE HCL 50 MG/ML IJ SOLN
INTRAMUSCULAR | Status: AC
  Filled 2020-03-29: qty 1

## 2020-03-29 MED ORDER — ACETAMINOPHEN 325 MG PO TABS
650.0000 mg | ORAL_TABLET | Freq: Once | ORAL | Status: AC
  Administered 2020-03-29: 650 mg via ORAL

## 2020-03-29 NOTE — Patient Instructions (Signed)
[facility] Discharge Instructions for Patients Receiving Chemotherapy  Today you received the following chemotherapy agents: Ramucirumab (Cyramza) and Docetaxel   To help prevent nausea and vomiting after your treatment, we encourage you to take your nausea medication  as prescribed.    If you develop nausea and vomiting that is not controlled by your nausea medication, call the clinic.   BELOW ARE SYMPTOMS THAT SHOULD BE REPORTED IMMEDIATELY:  *FEVER GREATER THAN 100.5 F  *CHILLS WITH OR WITHOUT FEVER  NAUSEA AND VOMITING THAT IS NOT CONTROLLED WITH YOUR NAUSEA MEDICATION  *UNUSUAL SHORTNESS OF BREATH  *UNUSUAL BRUISING OR BLEEDING  TENDERNESS IN MOUTH AND THROAT WITH OR WITHOUT PRESENCE OF ULCERS  *URINARY PROBLEMS  *BOWEL PROBLEMS  UNUSUAL RASH Items with * indicate a potential emergency and should be followed up as soon as possible.  Feel free to call the clinic should you have any questions or concerns. The clinic phone number is [phone].  Please show the [facility] at check-in to the Emergency Department and triage nurse.

## 2020-03-29 NOTE — Patient Instructions (Signed)

## 2020-03-31 ENCOUNTER — Other Ambulatory Visit: Payer: Self-pay

## 2020-03-31 ENCOUNTER — Inpatient Hospital Stay: Payer: Medicare HMO

## 2020-03-31 ENCOUNTER — Telehealth: Payer: Self-pay | Admitting: Physician Assistant

## 2020-03-31 VITALS — BP 112/76 | HR 75 | Resp 18

## 2020-03-31 DIAGNOSIS — Z5112 Encounter for antineoplastic immunotherapy: Secondary | ICD-10-CM | POA: Diagnosis not present

## 2020-03-31 DIAGNOSIS — C3491 Malignant neoplasm of unspecified part of right bronchus or lung: Secondary | ICD-10-CM

## 2020-03-31 MED ORDER — PEGFILGRASTIM-JMDB 6 MG/0.6ML ~~LOC~~ SOSY
6.0000 mg | PREFILLED_SYRINGE | Freq: Once | SUBCUTANEOUS | Status: AC
  Administered 2020-03-31: 6 mg via SUBCUTANEOUS

## 2020-03-31 MED ORDER — PEGFILGRASTIM-JMDB 6 MG/0.6ML ~~LOC~~ SOSY
PREFILLED_SYRINGE | SUBCUTANEOUS | Status: AC
  Filled 2020-03-31: qty 0.6

## 2020-03-31 NOTE — Patient Instructions (Signed)

## 2020-03-31 NOTE — Telephone Encounter (Signed)
Scheduled appts per 1/5 los. Pt to get updated appt calendar at next visit per appt notes.

## 2020-04-05 ENCOUNTER — Other Ambulatory Visit: Payer: Medicare HMO

## 2020-04-12 ENCOUNTER — Other Ambulatory Visit: Payer: Self-pay | Admitting: Physician Assistant

## 2020-04-12 ENCOUNTER — Other Ambulatory Visit: Payer: Self-pay

## 2020-04-12 ENCOUNTER — Telehealth: Payer: Self-pay | Admitting: Physician Assistant

## 2020-04-12 ENCOUNTER — Inpatient Hospital Stay: Payer: Medicare HMO

## 2020-04-12 DIAGNOSIS — E876 Hypokalemia: Secondary | ICD-10-CM

## 2020-04-12 DIAGNOSIS — Z95828 Presence of other vascular implants and grafts: Secondary | ICD-10-CM

## 2020-04-12 DIAGNOSIS — Z5112 Encounter for antineoplastic immunotherapy: Secondary | ICD-10-CM | POA: Diagnosis not present

## 2020-04-12 DIAGNOSIS — C3491 Malignant neoplasm of unspecified part of right bronchus or lung: Secondary | ICD-10-CM

## 2020-04-12 LAB — CBC WITH DIFFERENTIAL (CANCER CENTER ONLY)
Abs Immature Granulocytes: 0.05 10*3/uL (ref 0.00–0.07)
Basophils Absolute: 0 10*3/uL (ref 0.0–0.1)
Basophils Relative: 0 %
Eosinophils Absolute: 0 10*3/uL (ref 0.0–0.5)
Eosinophils Relative: 0 %
HCT: 29.6 % — ABNORMAL LOW (ref 36.0–46.0)
Hemoglobin: 9.7 g/dL — ABNORMAL LOW (ref 12.0–15.0)
Immature Granulocytes: 1 %
Lymphocytes Relative: 24 %
Lymphs Abs: 2.1 10*3/uL (ref 0.7–4.0)
MCH: 32.6 pg (ref 26.0–34.0)
MCHC: 32.8 g/dL (ref 30.0–36.0)
MCV: 99.3 fL (ref 80.0–100.0)
Monocytes Absolute: 0.4 10*3/uL (ref 0.1–1.0)
Monocytes Relative: 5 %
Neutro Abs: 5.9 10*3/uL (ref 1.7–7.7)
Neutrophils Relative %: 70 %
Platelet Count: 249 10*3/uL (ref 150–400)
RBC: 2.98 MIL/uL — ABNORMAL LOW (ref 3.87–5.11)
RDW: 16.9 % — ABNORMAL HIGH (ref 11.5–15.5)
WBC Count: 8.5 10*3/uL (ref 4.0–10.5)
nRBC: 0 % (ref 0.0–0.2)

## 2020-04-12 LAB — TSH: TSH: 3.78 u[IU]/mL (ref 0.308–3.960)

## 2020-04-12 LAB — CMP (CANCER CENTER ONLY)
ALT: 11 U/L (ref 0–44)
AST: 20 U/L (ref 15–41)
Albumin: 3.4 g/dL — ABNORMAL LOW (ref 3.5–5.0)
Alkaline Phosphatase: 67 U/L (ref 38–126)
Anion gap: 11 (ref 5–15)
BUN: 25 mg/dL — ABNORMAL HIGH (ref 8–23)
CO2: 30 mmol/L (ref 22–32)
Calcium: 9.3 mg/dL (ref 8.9–10.3)
Chloride: 98 mmol/L (ref 98–111)
Creatinine: 1.06 mg/dL — ABNORMAL HIGH (ref 0.44–1.00)
GFR, Estimated: 55 mL/min — ABNORMAL LOW (ref >60)
Glucose, Bld: 89 mg/dL (ref 70–99)
Potassium: 3 mmol/L — ABNORMAL LOW (ref 3.5–5.1)
Sodium: 139 mmol/L (ref 135–145)
Total Bilirubin: 0.6 mg/dL (ref 0.3–1.2)
Total Protein: 7.1 g/dL (ref 6.5–8.1)

## 2020-04-12 MED ORDER — SODIUM CHLORIDE 0.9% FLUSH
10.0000 mL | INTRAVENOUS | Status: DC | PRN
  Administered 2020-04-12: 10 mL
  Filled 2020-04-12: qty 10

## 2020-04-12 MED ORDER — POTASSIUM CHLORIDE CRYS ER 20 MEQ PO TBCR: 20.0000 meq | EXTENDED_RELEASE_TABLET | Freq: Every day | ORAL | 0 refills | Status: DC

## 2020-04-12 MED ORDER — HEPARIN SOD (PORK) LOCK FLUSH 100 UNIT/ML IV SOLN
500.0000 [IU] | Freq: Once | INTRAVENOUS | Status: AC | PRN
Start: 2020-04-12 — End: 2020-04-12
  Administered 2020-04-12: 500 [IU]
  Filled 2020-04-12: qty 5

## 2020-04-12 NOTE — Telephone Encounter (Signed)
I called the patient to let her know I sent a prescription for potassium to her pharmacy.

## 2020-04-12 NOTE — Patient Instructions (Signed)
Implanted [facility] Guide An implanted port is a device that is placed under the skin. It is usually placed in the chest. The device can be used to give IV medicine, to take blood, or for dialysis. You may have an implanted port if:  You need IV medicine that would be irritating to the small veins in your hands or arms.  You need IV medicines, such as antibiotics, for a long period of time.  You need IV nutrition for a long period of time.  You need dialysis. When you have a port, your health care provider can choose to use the port instead of veins in your arms for these procedures. You may have fewer limitations when using a port than you would if you used other types of long-term IVs, and you will likely be able to return to normal activities after your incision heals. An implanted port has two main parts:  Reservoir. The reservoir is the part where a needle is inserted to give medicines or draw blood. The reservoir is round. After it is placed, it appears as a small, raised area under your skin.  Catheter. The catheter is a thin, flexible tube that connects the reservoir to a vein. Medicine that is inserted into the reservoir goes into the catheter and then into the vein. How is my port accessed? To access your port:  A numbing cream may be placed on the skin over the port site.  Your health care provider will put on a mask and sterile gloves.  The skin over your port will be cleaned carefully with a germ-killing soap and allowed to dry.  Your health care provider will gently pinch the port and insert a needle into it.  Your health care provider will check for a blood return to make sure the port is in the vein and is not clogged.  If your port needs to remain accessed to get medicine continuously (constant infusion), your health care provider will place a clear bandage (dressing) over the needle site. The dressing and needle will need to be changed every week, or as told by your  health care provider. What is flushing? Flushing helps keep the port from getting clogged. Follow instructions from your health care provider about how and when to flush the port. Ports are usually flushed with saline solution or a medicine called heparin. The need for flushing will depend on how the port is used:  If the port is only used from time to time to give medicines or draw blood, the port may need to be flushed: ? Before and after medicines have been given. ? Before and after blood has been drawn. ? As part of routine maintenance. Flushing may be recommended every 4-6 weeks.  If a constant infusion is running, the port may not need to be flushed.  Throw away any syringes in a disposal container that is meant for sharp items (sharps container). You can buy a sharps container from a pharmacy, or you can make one by using an empty hard plastic bottle with a cover. How long will my port stay implanted? The port can stay in for as long as your health care provider thinks it is needed. When it is time for the port to come out, a surgery will be done to remove it. The surgery will be similar to the procedure that was done to put the port in. Follow these instructions at home:  Flush your port as told by your health care  provider.  If you need an infusion over several days, follow instructions from your health care provider about how to take care of your port site. Make sure you: ? Wash your hands with soap and water before you change your dressing. If soap and water are not available, use alcohol-based hand sanitizer. ? Change your dressing as told by your health care provider. ? Place any used dressings or infusion bags into a plastic bag. Throw that bag in the trash. ? Keep the dressing that covers the needle clean and dry. Do not get it wet. ? Do not use scissors or sharp objects near the tube. ? Keep the tube clamped, unless it is being used.  Check your port site every day for signs  of infection. Check for: ? Redness, swelling, or pain. ? Fluid or blood. ? Pus or a bad smell.  Protect the skin around the port site. ? Avoid wearing bra straps that rub or irritate the site. ? Protect the skin around your port from seat belts. Place a soft pad over your chest if needed.  Bathe or shower as told by your health care provider. The site may get wet as long as you are not actively receiving an infusion.  Return to your normal activities as told by your health care provider. Ask your health care provider what activities are safe for you.  Carry a medical alert card or wear a medical alert bracelet at all times. This will let health care providers know that you have an implanted port in case of an emergency.   Get help right away if:  You have redness, swelling, or pain at the port site.  You have fluid or blood coming from your port site.  You have pus or a bad smell coming from the port site.  You have a fever. Summary  Implanted ports are usually placed in the chest for long-term IV access.  Follow instructions from your health care provider about flushing the port and changing bandages (dressings).  Take care of the area around your port by avoiding clothing that puts pressure on the area, and by watching for signs of infection.  Protect the skin around your port from seat belts. Place a soft pad over your chest if needed.  Get help right away if you have a fever or you have redness, swelling, pain, drainage, or a bad smell at the port site. This information is not intended to replace advice given to you by your health care provider. Make sure you discuss any questions you have with your health care provider. Document Revised: 07/26/2019 Document Reviewed: 07/26/2019 Elsevier Patient Education  [place].

## 2020-04-19 ENCOUNTER — Other Ambulatory Visit: Payer: Self-pay

## 2020-04-19 ENCOUNTER — Encounter: Payer: Self-pay | Admitting: Internal Medicine

## 2020-04-19 ENCOUNTER — Inpatient Hospital Stay: Payer: Medicare HMO

## 2020-04-19 ENCOUNTER — Inpatient Hospital Stay (HOSPITAL_BASED_OUTPATIENT_CLINIC_OR_DEPARTMENT_OTHER): Payer: Medicare HMO | Admitting: Internal Medicine

## 2020-04-19 VITALS — BP 106/67 | HR 87 | Temp 97.9°F | Resp 16 | Ht 61.0 in | Wt 132.7 lb

## 2020-04-19 DIAGNOSIS — C3491 Malignant neoplasm of unspecified part of right bronchus or lung: Secondary | ICD-10-CM

## 2020-04-19 DIAGNOSIS — I10 Essential (primary) hypertension: Secondary | ICD-10-CM

## 2020-04-19 DIAGNOSIS — Z5111 Encounter for antineoplastic chemotherapy: Secondary | ICD-10-CM

## 2020-04-19 DIAGNOSIS — Z95828 Presence of other vascular implants and grafts: Secondary | ICD-10-CM

## 2020-04-19 DIAGNOSIS — C3490 Malignant neoplasm of unspecified part of unspecified bronchus or lung: Secondary | ICD-10-CM

## 2020-04-19 DIAGNOSIS — Z5112 Encounter for antineoplastic immunotherapy: Secondary | ICD-10-CM | POA: Diagnosis not present

## 2020-04-19 LAB — CBC WITH DIFFERENTIAL (CANCER CENTER ONLY)
Abs Immature Granulocytes: 0.01 10*3/uL (ref 0.00–0.07)
Basophils Absolute: 0.1 10*3/uL (ref 0.0–0.1)
Basophils Relative: 1 %
Eosinophils Absolute: 0 10*3/uL (ref 0.0–0.5)
Eosinophils Relative: 0 %
HCT: 29.1 % — ABNORMAL LOW (ref 36.0–46.0)
Hemoglobin: 9.5 g/dL — ABNORMAL LOW (ref 12.0–15.0)
Immature Granulocytes: 0 %
Lymphocytes Relative: 33 %
Lymphs Abs: 1.8 10*3/uL (ref 0.7–4.0)
MCH: 33.5 pg (ref 26.0–34.0)
MCHC: 32.6 g/dL (ref 30.0–36.0)
MCV: 102.5 fL — ABNORMAL HIGH (ref 80.0–100.0)
Monocytes Absolute: 0.7 10*3/uL (ref 0.1–1.0)
Monocytes Relative: 13 %
Neutro Abs: 2.9 10*3/uL (ref 1.7–7.7)
Neutrophils Relative %: 53 %
Platelet Count: 332 10*3/uL (ref 150–400)
RBC: 2.84 MIL/uL — ABNORMAL LOW (ref 3.87–5.11)
RDW: 17.2 % — ABNORMAL HIGH (ref 11.5–15.5)
WBC Count: 5.4 10*3/uL (ref 4.0–10.5)
nRBC: 0 % (ref 0.0–0.2)

## 2020-04-19 LAB — CMP (CANCER CENTER ONLY)
ALT: 9 U/L (ref 0–44)
AST: 14 U/L — ABNORMAL LOW (ref 15–41)
Albumin: 3.3 g/dL — ABNORMAL LOW (ref 3.5–5.0)
Alkaline Phosphatase: 49 U/L (ref 38–126)
Anion gap: 7 (ref 5–15)
BUN: 25 mg/dL — ABNORMAL HIGH (ref 8–23)
CO2: 29 mmol/L (ref 22–32)
Calcium: 9.2 mg/dL (ref 8.9–10.3)
Chloride: 103 mmol/L (ref 98–111)
Creatinine: 0.93 mg/dL (ref 0.44–1.00)
GFR, Estimated: >60 mL/min (ref >60)
Glucose, Bld: 97 mg/dL (ref 70–99)
Potassium: 3.8 mmol/L (ref 3.5–5.1)
Sodium: 139 mmol/L (ref 135–145)
Total Bilirubin: 0.7 mg/dL (ref 0.3–1.2)
Total Protein: 6.7 g/dL (ref 6.5–8.1)

## 2020-04-19 LAB — TOTAL PROTEIN, URINE DIPSTICK: Protein, ur: NEGATIVE mg/dL

## 2020-04-19 MED ORDER — SODIUM CHLORIDE 0.9% FLUSH
10.0000 mL | INTRAVENOUS | Status: DC | PRN
  Administered 2020-04-19: 10 mL
  Filled 2020-04-19: qty 10

## 2020-04-19 MED ORDER — ACETAMINOPHEN 325 MG PO TABS
ORAL_TABLET | ORAL | Status: AC
  Filled 2020-04-19: qty 2

## 2020-04-19 MED ORDER — DEXAMETHASONE SODIUM PHOSPHATE 100 MG/10ML IJ SOLN
10.0000 mg | Freq: Once | INTRAMUSCULAR | Status: AC
  Administered 2020-04-19: 10 mg via INTRAVENOUS
  Filled 2020-04-19: qty 10

## 2020-04-19 MED ORDER — SODIUM CHLORIDE 0.9 % IV SOLN
10.0000 mg/kg | Freq: Once | INTRAVENOUS | Status: AC
  Administered 2020-04-19: 600 mg via INTRAVENOUS
  Filled 2020-04-19: qty 50

## 2020-04-19 MED ORDER — SODIUM CHLORIDE 0.9 % IV SOLN
Freq: Once | INTRAVENOUS | Status: AC
  Filled 2020-04-19: qty 250

## 2020-04-19 MED ORDER — ACETAMINOPHEN 325 MG PO TABS
650.0000 mg | ORAL_TABLET | Freq: Once | ORAL | Status: AC
  Administered 2020-04-19: 650 mg via ORAL

## 2020-04-19 MED ORDER — SODIUM CHLORIDE 0.9 % IV SOLN
75.0000 mg/m2 | Freq: Once | INTRAVENOUS | Status: AC
  Administered 2020-04-19: 120 mg via INTRAVENOUS
  Filled 2020-04-19: qty 12

## 2020-04-19 MED ORDER — HEPARIN SOD (PORK) LOCK FLUSH 100 UNIT/ML IV SOLN
500.0000 [IU] | Freq: Once | INTRAVENOUS | Status: AC | PRN
  Administered 2020-04-19: 500 [IU]
  Filled 2020-04-19: qty 5

## 2020-04-19 MED ORDER — DIPHENHYDRAMINE HCL 50 MG/ML IJ SOLN
INTRAMUSCULAR | Status: AC
  Filled 2020-04-19: qty 1

## 2020-04-19 MED ORDER — DIPHENHYDRAMINE HCL 50 MG/ML IJ SOLN
50.0000 mg | Freq: Once | INTRAMUSCULAR | Status: AC
  Administered 2020-04-19: 50 mg via INTRAVENOUS

## 2020-04-19 NOTE — Progress Notes (Signed)
[facility] [phone]   [phone]  OFFICE PROGRESS NOTE  [name], MD [place] [address]  DIAGNOSIS: Stage IV non-small cell lung cancer, squamous cell carcinoma. She presented withright upper lobe lung mass in addition to pleural-based metastasis andmediastinal lymphadenopathy.She was diagnosed in September 2020.  PRIOR THERAPY: Chemotherapy with carboplatin for an AUC of 5,paclitaxel 175 mg/m, and Keytruda 200 mg IV every 3 weeks with Neulasta support.Last dose9/15/21.Status post18cycles. Starting from cycle #5 was on maintenance treatment with single agent Keytruda every 3 weeks.This was discontinued due to evidence of disease progression.   CURRENT THERAPY: Palliative systemic chemotherapy with docetaxel 75 mg per metered squared and Cyramza 10 mg/kg IV every 3 weeks with Neulasta support. First dose on 01/04/20.  Status post 5 cycles.  INTERVAL HISTORY: Debra Rodgers 75 y.o. female returns to the clinic today for follow-up visit.  The patient is feeling fine today with no concerning complaints.  She denied having any current chest pain, shortness of breath, cough or hemoptysis.  She denied having any fever or chills.  She has no nausea, vomiting, diarrhea or constipation.  She denied having any headache or visual changes.  She continues to tolerate her treatment with docetaxel and Cyramza fairly well.  The patient is here today for evaluation before starting cycle #6 of her treatment.   MEDICAL HISTORY: Past Medical History:  Diagnosis Date  . COPD (chronic obstructive pulmonary disease) ([place])   . Depression   . Essential hypertension   . Headache   . History of migraine headaches   . lung ca dx'd 10/2018  . Sleep apnea   . Tobacco use disorder     ALLERGIES:  is allergic to codeine sulfate and pantoprazole sodium.  MEDICATIONS:  Current Outpatient Medications  Medication Sig  Dispense Refill  . albuterol (VENTOLIN HFA) 108 (90 Base) MCG/ACT inhaler Inhale 2 puffs into the lungs every 6 (six) hours as needed for wheezing or shortness of breath. 36 g 2  . amLODipine (NORVASC) 10 MG tablet Take 1 tablet (10 mg total) by mouth daily. 90 tablet 3  . atorvastatin (LIPITOR) 20 MG tablet Take 1 tablet (20 mg total) by mouth daily. 90 tablet 3  . benzonatate (TESSALON) 100 MG capsule TAKE 1 CAPSULE EVERY 8 HOURS AS NEEDED FOR COUGH 90 capsule 0  . chlorthalidone (HYGROTON) 50 MG tablet TAKE 1 TABLET EVERY DAY 90 tablet 3  . dexamethasone (DECADRON) 4 MG tablet Please take TWO tablets TWICE a day the day before, the day of, and the day after chemotherapy 120 tablet 2  . famotidine (PEPCID) 20 MG tablet Take 20 mg by mouth daily.     Marland Kitchen FLUoxetine (PROZAC) 20 MG capsule TAKE 1 CAPSULE EVERY DAY 90 capsule 3  . fluticasone (FLONASE) 50 MCG/ACT nasal spray Place 1 spray into both nostrils daily. 11.1 mL 0  . HYDROcodone-acetaminophen (NORCO) 7.5-325 MG tablet Take 1 tablet by mouth every 6 (six) hours as needed for moderate pain. 60 tablet 0  . lidocaine-prilocaine (EMLA) cream Apply 1 application topically as needed. 30 g 0  . magnesium oxide (MAG-OX) 400 MG tablet Take 1 tablet (400 mg total) by mouth daily. 30 tablet 1  . polyvinyl alcohol (LIQUIFILM TEARS) 1.4 % ophthalmic solution Place 1 drop into both eyes as needed for dry eyes.    . potassium chloride SA (KLOR-CON M20) 20 MEQ tablet Take 1 tablet (20 mEq total) by  mouth daily. 10 tablet 0  . tiotropium (SPIRIVA HANDIHALER) 18 MCG inhalation capsule Place 1 capsule (18 mcg total) into inhaler and inhale daily. (Patient not taking: Reported on 03/08/2020) 30 capsule 2   No current facility-administered medications for this visit.    SURGICAL HISTORY:  Past Surgical History:  Procedure Laterality Date  . ABDOMINAL HYSTERECTOMY    . CHOLECYSTECTOMY    . IR IMAGING GUIDED PORT INSERTION  02/24/2019  . ROTATOR CUFF REPAIR   3/04  . VIDEO BRONCHOSCOPY WITH ENDOBRONCHIAL ULTRASOUND Right 11/25/2018   Procedure: VIDEO BRONCHOSCOPY WITH ENDOBRONCHIAL ULTRASOUND;  Surgeon: Collene Gobble, MD;  Location: [facility] OR;  Service: Thoracic;  Laterality: Right;    REVIEW OF SYSTEMS:  A comprehensive review of systems was negative except for: Constitutional: positive for fatigue   PHYSICAL EXAMINATION: General appearance: alert, cooperative, fatigued and no distress Head: Normocephalic, without obvious abnormality, atraumatic Neck: no adenopathy, no JVD, supple, symmetrical, trachea midline and thyroid not enlarged, symmetric, no tenderness/mass/nodules Lymph nodes: Cervical, supraclavicular, and axillary nodes normal. Resp: clear to auscultation bilaterally Back: symmetric, no curvature. ROM normal. No CVA tenderness. Cardio: regular rate and rhythm, S1, S2 normal, no murmur, click, rub or gallop GI: soft, non-tender; bowel sounds normal; no masses,  no organomegaly Extremities: extremities normal, atraumatic, no cyanosis or edema  ECOG PERFORMANCE STATUS: 1 - Symptomatic but completely ambulatory  Blood pressure 106/67, pulse 87, temperature 97.9 F (36.6 C), temperature source Tympanic, resp. rate 16, height 5' 1"  (1.549 m), weight 132 lb 11.2 oz (60.2 kg), SpO2 100 %.  LABORATORY DATA: Lab Results  Component Value Date   WBC 5.4 04/19/2020   HGB 9.5 (L) 04/19/2020   HCT 29.1 (L) 04/19/2020   MCV 102.5 (H) 04/19/2020   PLT 332 04/19/2020      Chemistry      Component Value Date/Time   NA 139 04/19/2020 1125   NA 139 07/21/2017 0958   K 3.8 04/19/2020 1125   CL 103 04/19/2020 1125   CO2 29 04/19/2020 1125   BUN 25 (H) 04/19/2020 1125   BUN 26 07/21/2017 0958   CREATININE 0.93 04/19/2020 1125   CREATININE 0.79 09/03/2013 1540      Component Value Date/Time   CALCIUM 9.2 04/19/2020 1125   ALKPHOS 49 04/19/2020 1125   AST 14 (L) 04/19/2020 1125   ALT 9 04/19/2020 1125   BILITOT 0.7 04/19/2020 1125        RADIOGRAPHIC STUDIES: No results found.  ASSESSMENT AND PLAN: This is a very pleasant 75 years old African-American female with stage IV non-small cell carcinoma,, squamous cell carcinoma diagnosed in September 2020.  She presented with extensive right-sided pleural and thoracic nodal hypermetabolic disease with no extrathoracic disease. The patient started induction treatment with systemic chemotherapy with carboplatin, paclitaxel and Keytruda status post 4 cycles with partial response after cycle #4.  This was followed by 14 cycles of maintenance treatment with single agent Keytruda discontinued secondary to disease progression. The patient is currently undergoing second line systemic chemotherapy with docetaxel 75 mg/M2 and Cyramza 10 mg/KG every 3 weeks with Neulasta support.  Status post 5 cycles. She has been tolerating this treatment well with no concerning adverse effects. I recommended for her to proceed with cycle #6 today as planned. I will see her back for follow-up visit in 3 weeks for evaluation with repeat CT scan of the chest, abdomen pelvis for restaging of her disease. For the history of deep venous thrombosis, she will continue her  current treatment with Xarelto. The patient was advised to call immediately if she has any concerning symptoms in the interval.  The patient voices understanding of current disease status and treatment options and is in agreement with the current care plan. All questions were answered. The patient knows to call the clinic with any problems, questions or concerns. We can certainly see the patient much sooner if necessary.  Disclaimer: This note was dictated with voice recognition software. Similar sounding words can inadvertently be transcribed and may not be corrected upon review.

## 2020-04-19 NOTE — Patient Instructions (Signed)
[facility] Discharge Instructions for Patients Receiving Chemotherapy  Today you received the following chemotherapy agents: ramucirumab/docetaxel.  To help prevent nausea and vomiting after your treatment, we encourage you to take your nausea medication as directed.   If you develop nausea and vomiting that is not controlled by your nausea medication, call the clinic.   BELOW ARE SYMPTOMS THAT SHOULD BE REPORTED IMMEDIATELY:  *FEVER GREATER THAN 100.5 F  *CHILLS WITH OR WITHOUT FEVER  NAUSEA AND VOMITING THAT IS NOT CONTROLLED WITH YOUR NAUSEA MEDICATION  *UNUSUAL SHORTNESS OF BREATH  *UNUSUAL BRUISING OR BLEEDING  TENDERNESS IN MOUTH AND THROAT WITH OR WITHOUT PRESENCE OF ULCERS  *URINARY PROBLEMS  *BOWEL PROBLEMS  UNUSUAL RASH Items with * indicate a potential emergency and should be followed up as soon as possible.  Feel free to call the clinic should you have any questions or concerns. The clinic phone number is [phone].  Please show the [facility] at check-in to the Emergency Department and triage nurse.

## 2020-04-21 ENCOUNTER — Inpatient Hospital Stay: Payer: Medicare HMO

## 2020-04-21 ENCOUNTER — Other Ambulatory Visit: Payer: Self-pay

## 2020-04-21 VITALS — BP 101/67 | HR 92 | Resp 18

## 2020-04-21 DIAGNOSIS — Z5112 Encounter for antineoplastic immunotherapy: Secondary | ICD-10-CM | POA: Diagnosis not present

## 2020-04-21 DIAGNOSIS — C3491 Malignant neoplasm of unspecified part of right bronchus or lung: Secondary | ICD-10-CM

## 2020-04-21 MED ORDER — PEGFILGRASTIM-JMDB 6 MG/0.6ML ~~LOC~~ SOSY
6.0000 mg | PREFILLED_SYRINGE | Freq: Once | SUBCUTANEOUS | Status: AC
  Administered 2020-04-21: 6 mg via SUBCUTANEOUS

## 2020-04-21 MED ORDER — PEGFILGRASTIM-JMDB 6 MG/0.6ML ~~LOC~~ SOSY
PREFILLED_SYRINGE | SUBCUTANEOUS | Status: AC
  Filled 2020-04-21: qty 0.6

## 2020-04-21 NOTE — Patient Instructions (Signed)
Pegfilgrastim injection What is this medicine? PEGFILGRASTIM (PEG fil gra stim) is a long-acting granulocyte colony-stimulating factor that stimulates the growth of neutrophils, a type of white blood cell important in the body's fight against infection. It is used to reduce the incidence of fever and infection in patients with certain types of cancer who are receiving chemotherapy that affects the bone marrow, and to increase survival after being exposed to high doses of radiation. This medicine may be used for other purposes; ask your health care provider or pharmacist if you have questions. COMMON BRAND NAME(S): Rexene Edison, Ziextenzo What should I tell my health care provider before I take this medicine? They need to know if you have any of these conditions:  kidney disease  latex allergy  ongoing radiation therapy  sickle cell disease  skin reactions to acrylic adhesives (On-Body Injector only)  an unusual or allergic reaction to pegfilgrastim, filgrastim, other medicines, foods, dyes, or preservatives  pregnant or trying to get pregnant  breast-feeding How should I use this medicine? This medicine is for injection under the skin. If you get this medicine at home, you will be taught how to prepare and give the pre-filled syringe or how to use the On-body Injector. Refer to the patient Instructions for Use for detailed instructions. Use exactly as directed. Tell your healthcare provider immediately if you suspect that the On-body Injector may not have performed as intended or if you suspect the use of the On-body Injector resulted in a missed or partial dose. It is important that you put your used needles and syringes in a special sharps container. Do not put them in a trash can. If you do not have a sharps container, call your pharmacist or healthcare provider to get one. Talk to your pediatrician regarding the use of this medicine in children. While this drug  may be prescribed for selected conditions, precautions do apply. Overdosage: If you think you have taken too much of this medicine contact a poison control center or emergency room at once. NOTE: This medicine is only for you. Do not share this medicine with others. What if I miss a dose? It is important not to miss your dose. Call your doctor or health care professional if you miss your dose. If you miss a dose due to an On-body Injector failure or leakage, a new dose should be administered as soon as possible using a single prefilled syringe for manual use. What may interact with this medicine? Interactions have not been studied. This list may not describe all possible interactions. Give your health care provider a list of all the medicines, herbs, non-prescription drugs, or dietary supplements you use. Also tell them if you smoke, drink alcohol, or use illegal drugs. Some items may interact with your medicine. What should I watch for while using this medicine? Your condition will be monitored carefully while you are receiving this medicine. You may need blood work done while you are taking this medicine. Talk to your health care provider about your risk of cancer. You may be more at risk for certain types of cancer if you take this medicine. If you are going to need a MRI, CT scan, or other procedure, tell your doctor that you are using this medicine (On-Body Injector only). What side effects may I notice from receiving this medicine? Side effects that you should report to your doctor or health care professional as soon as possible:  allergic reactions (skin rash, itching or hives, swelling of  the face, lips, or tongue)  back pain  dizziness  fever  pain, redness, or irritation at site where injected  pinpoint red spots on the skin  red or dark-brown urine  shortness of breath or breathing problems  stomach or side pain, or pain at the shoulder  swelling  tiredness  trouble  passing urine or change in the amount of urine  unusual bruising or bleeding Side effects that usually do not require medical attention (report to your doctor or health care professional if they continue or are bothersome):  bone pain  muscle pain This list may not describe all possible side effects. Call your doctor for medical advice about side effects. You may report side effects to FDA at 1-800-FDA-1088. Where should I keep my medicine? Keep out of the reach of children. If you are using this medicine at home, you will be instructed on how to store it. Throw away any unused medicine after the expiration date on the label. NOTE: This sheet is a summary. It may not cover all possible information. If you have questions about this medicine, talk to your doctor, pharmacist, or health care provider.  2021 Elsevier/Gold Standard (2019-04-02 13:20:51)

## 2020-04-24 ENCOUNTER — Telehealth: Payer: Self-pay | Admitting: Internal Medicine

## 2020-04-24 NOTE — Telephone Encounter (Signed)
Per 1/26 los, next appt already scheduled

## 2020-04-26 ENCOUNTER — Other Ambulatory Visit: Payer: Self-pay

## 2020-04-26 ENCOUNTER — Emergency Department (HOSPITAL_COMMUNITY): Payer: Medicare HMO

## 2020-04-26 ENCOUNTER — Inpatient Hospital Stay: Payer: Medicare HMO | Attending: Physician Assistant

## 2020-04-26 ENCOUNTER — Emergency Department (HOSPITAL_COMMUNITY)
Admission: EM | Admit: 2020-04-26 | Discharge: 2020-04-26 | Disposition: A | Discharge status: Home / Self Care | Payer: Medicare HMO | Attending: Emergency Medicine | Admitting: Emergency Medicine

## 2020-04-26 ENCOUNTER — Inpatient Hospital Stay: Payer: Medicare HMO

## 2020-04-26 ENCOUNTER — Encounter (HOSPITAL_COMMUNITY): Payer: Self-pay

## 2020-04-26 DIAGNOSIS — I714 Abdominal aortic aneurysm, without rupture, unspecified: Secondary | ICD-10-CM

## 2020-04-26 DIAGNOSIS — R51 Headache with orthostatic component, not elsewhere classified: Secondary | ICD-10-CM | POA: Insufficient documentation

## 2020-04-26 DIAGNOSIS — J439 Emphysema, unspecified: Secondary | ICD-10-CM | POA: Insufficient documentation

## 2020-04-26 DIAGNOSIS — Z85118 Personal history of other malignant neoplasm of bronchus and lung: Secondary | ICD-10-CM | POA: Insufficient documentation

## 2020-04-26 DIAGNOSIS — J449 Chronic obstructive pulmonary disease, unspecified: Secondary | ICD-10-CM | POA: Diagnosis not present

## 2020-04-26 DIAGNOSIS — I10 Essential (primary) hypertension: Secondary | ICD-10-CM | POA: Insufficient documentation

## 2020-04-26 DIAGNOSIS — G4730 Sleep apnea, unspecified: Secondary | ICD-10-CM | POA: Insufficient documentation

## 2020-04-26 DIAGNOSIS — Z87891 Personal history of nicotine dependence: Secondary | ICD-10-CM | POA: Insufficient documentation

## 2020-04-26 DIAGNOSIS — Z79899 Other long term (current) drug therapy: Secondary | ICD-10-CM | POA: Insufficient documentation

## 2020-04-26 DIAGNOSIS — G629 Polyneuropathy, unspecified: Secondary | ICD-10-CM | POA: Insufficient documentation

## 2020-04-26 DIAGNOSIS — Z7952 Long term (current) use of systemic steroids: Secondary | ICD-10-CM | POA: Diagnosis not present

## 2020-04-26 DIAGNOSIS — R1013 Epigastric pain: Secondary | ICD-10-CM

## 2020-04-26 DIAGNOSIS — F329 Major depressive disorder, single episode, unspecified: Secondary | ICD-10-CM | POA: Insufficient documentation

## 2020-04-26 DIAGNOSIS — F17210 Nicotine dependence, cigarettes, uncomplicated: Secondary | ICD-10-CM | POA: Insufficient documentation

## 2020-04-26 DIAGNOSIS — R109 Unspecified abdominal pain: Secondary | ICD-10-CM | POA: Diagnosis present

## 2020-04-26 DIAGNOSIS — I700 Atherosclerosis of aorta: Secondary | ICD-10-CM | POA: Insufficient documentation

## 2020-04-26 DIAGNOSIS — Z5111 Encounter for antineoplastic chemotherapy: Secondary | ICD-10-CM | POA: Insufficient documentation

## 2020-04-26 DIAGNOSIS — I2510 Atherosclerotic heart disease of native coronary artery without angina pectoris: Secondary | ICD-10-CM | POA: Insufficient documentation

## 2020-04-26 DIAGNOSIS — C3411 Malignant neoplasm of upper lobe, right bronchus or lung: Secondary | ICD-10-CM | POA: Insufficient documentation

## 2020-04-26 LAB — HEPATIC FUNCTION PANEL
ALT: 11 U/L (ref 0–44)
AST: 17 U/L (ref 15–41)
Albumin: 3.4 g/dL — ABNORMAL LOW (ref 3.5–5.0)
Alkaline Phosphatase: 58 U/L (ref 38–126)
Bilirubin, Direct: 0.1 mg/dL (ref 0.0–0.2)
Indirect Bilirubin: 0.6 mg/dL (ref 0.3–0.9)
Total Bilirubin: 0.7 mg/dL (ref 0.3–1.2)
Total Protein: 6.4 g/dL — ABNORMAL LOW (ref 6.5–8.1)

## 2020-04-26 LAB — CBC
HCT: 27.3 % — ABNORMAL LOW (ref 36.0–46.0)
Hemoglobin: 9 g/dL — ABNORMAL LOW (ref 12.0–15.0)
MCH: 33.8 pg (ref 26.0–34.0)
MCHC: 33 g/dL (ref 30.0–36.0)
MCV: 102.6 fL — ABNORMAL HIGH (ref 80.0–100.0)
Platelets: 123 10*3/uL — ABNORMAL LOW (ref 150–400)
RBC: 2.66 MIL/uL — ABNORMAL LOW (ref 3.87–5.11)
RDW: 16.2 % — ABNORMAL HIGH (ref 11.5–15.5)
WBC: 7 10*3/uL (ref 4.0–10.5)
nRBC: 0.3 % — ABNORMAL HIGH (ref 0.0–0.2)

## 2020-04-26 LAB — BASIC METABOLIC PANEL
Anion gap: 10 (ref 5–15)
BUN: 21 mg/dL (ref 8–23)
CO2: 27 mmol/L (ref 22–32)
Calcium: 9.3 mg/dL (ref 8.9–10.3)
Chloride: 97 mmol/L — ABNORMAL LOW (ref 98–111)
Creatinine, Ser: 0.83 mg/dL (ref 0.44–1.00)
GFR, Estimated: >60 mL/min (ref >60)
Glucose, Bld: 93 mg/dL (ref 70–99)
Potassium: 3 mmol/L — ABNORMAL LOW (ref 3.5–5.1)
Sodium: 134 mmol/L — ABNORMAL LOW (ref 135–145)

## 2020-04-26 LAB — LIPASE, BLOOD: Lipase: 31 U/L (ref 11–51)

## 2020-04-26 LAB — TROPONIN I (HIGH SENSITIVITY)
Troponin I (High Sensitivity): 5 ng/L (ref <18)
Troponin I (High Sensitivity): 5 ng/L (ref <18)

## 2020-04-26 MED ORDER — ONDANSETRON 4 MG PO TBDP
4.0000 mg | ORAL_TABLET | Freq: Once | ORAL | Status: AC
  Administered 2020-04-26: 4 mg via ORAL
  Filled 2020-04-26: qty 1

## 2020-04-26 MED ORDER — FENTANYL CITRATE (PF) 100 MCG/2ML IJ SOLN
50.0000 ug | Freq: Once | INTRAMUSCULAR | Status: AC
  Administered 2020-04-26: 50 ug via INTRAVENOUS
  Filled 2020-04-26: qty 2

## 2020-04-26 MED ORDER — HYDROMORPHONE HCL 1 MG/ML IJ SOLN
0.5000 mg | Freq: Once | INTRAMUSCULAR | Status: AC
  Administered 2020-04-26: 0.5 mg via INTRAVENOUS
  Filled 2020-04-26: qty 1

## 2020-04-26 MED ORDER — IOHEXOL 300 MG/ML  SOLN
100.0000 mL | Freq: Once | INTRAMUSCULAR | Status: AC | PRN
  Administered 2020-04-26: 100 mL via INTRAVENOUS

## 2020-04-26 MED ORDER — HEPARIN SOD (PORK) LOCK FLUSH 100 UNIT/ML IV SOLN
500.0000 [IU] | Freq: Once | INTRAVENOUS | Status: AC
  Administered 2020-04-26: 500 [IU]
  Filled 2020-04-26: qty 5

## 2020-04-26 MED ORDER — ONDANSETRON HCL 4 MG/2ML IJ SOLN
4.0000 mg | Freq: Once | INTRAMUSCULAR | Status: AC
  Administered 2020-04-26: 4 mg via INTRAVENOUS
  Filled 2020-04-26: qty 2

## 2020-04-26 NOTE — ED Notes (Signed)
Patient transported to CT 

## 2020-04-26 NOTE — Discharge Instructions (Addendum)
Please schedule an appointment with your primary care doctor, your oncologist as well as a vascular surgeon.  You have an abdominal aortic aneurysm that will need to be evaluated on an outpatient basis by a vascular surgeon.  If you have worsening of your abdominal pain or chest pain, please come back to ER for reassessment.

## 2020-04-26 NOTE — ED Triage Notes (Signed)
Pt presents from home, c/o midline chest pain and upper abd pain waking her up around 3a. Pt also reports a nose bleed this morning that has since resolved. Hx stage 4 lung CA, last chemo approx 1 mo ago, denies fevers or SOB

## 2020-04-26 NOTE — ED Provider Notes (Signed)
[facility] DEPT Provider Note   CSN: [id number] Arrival date & time: 04/26/20  [id number]     History Chief Complaint  Patient presents with  . Chest Pain    Debra Rodgers is a 75 y.o. female.  Came to ER with concern for chest pain and abdominal pain.  Patient states that she started having upper abdominal pain around 3 AM this morning.  States that has been relatively constant ever since is located in her lower chest, upper abdomen.  Does not radiate to the back.  Some associated nausea but no vomiting.  No fevers.  Patient has a history of stage IV non-small cell lung cancer, squamous cell carcinoma.  Followed by Dr. Earlie Server for oncology.  Currently on palliative systemic chemotherapy.   HPI     Past Medical History:  Diagnosis Date  . COPD (chronic obstructive pulmonary disease) ([place])   . Depression   . Essential hypertension   . Headache   . History of migraine headaches   . lung ca dx'd 10/2018  . Sleep apnea   . Tobacco use disorder     Patient Active Problem List   Diagnosis Date Noted  . Dehydration 03/29/2020  . Hypomagnesemia 01/13/2020  . DVT (deep venous thrombosis) ([place]) 07/12/2019  . Port-A-Cath in place 07/07/2019  . Cancer associated pain 12/30/2018  . Hypokalemia 12/16/2018  . Stage IV squamous cell carcinoma of right lung ([place]) 12/02/2018  . Goals of care, counseling/discussion 12/02/2018  . Encounter for antineoplastic chemotherapy 12/02/2018  . Encounter for antineoplastic immunotherapy 12/02/2018  . Abdominal aortic aneurysm (AAA) 35 to 39 mm in diameter ([place]) 11/05/2018  . BPPV (benign paroxysmal positional vertigo) 12/02/2016  . COPD (chronic obstructive pulmonary disease) ([place]) 09/23/2016  . OSA (obstructive sleep apnea) 10/02/2015  . Glucose intolerance 10/02/2015  . Adenomatous colon polyp 10/02/2015  . Essential hypertension 04/17/2015  . Major depressive disorder, recurrent episode ([place]) 04/17/2015  .  Osteoarthritis cervical spine 05/26/2013  . GERD 06/25/2007  . Hyperlipidemia 07/29/2006    Past Surgical History:  Procedure Laterality Date  . ABDOMINAL HYSTERECTOMY    . CHOLECYSTECTOMY    . IR IMAGING GUIDED PORT INSERTION  02/24/2019  . ROTATOR CUFF REPAIR  3/04  . VIDEO BRONCHOSCOPY WITH ENDOBRONCHIAL ULTRASOUND Right 11/25/2018   Procedure: VIDEO BRONCHOSCOPY WITH ENDOBRONCHIAL ULTRASOUND;  Surgeon: Collene Gobble, MD;  Location: [facility];  Service: Thoracic;  Laterality: Right;     OB History   No obstetric history on file.     Family History  Problem Relation Age of Onset  . Hypertension Mother   . Stroke Mother   . Coronary artery disease Mother   . Heart disease Father   . Diabetes Sister   . Hypertension Sister   . Cancer Sister   . Breast cancer Sister     Social History   Tobacco Use  . Smoking status: Former Smoker    Packs/day: 1.00    Years: 56.00    Pack years: 56.00    Types: Cigarettes    Start date: 1964    Quit date: 11/04/2018    Years since quitting: 1.4  . Smokeless tobacco: Never Used  Substance Use Topics  . Alcohol use: No    Alcohol/week: 0.0 standard drinks  . Drug use: No    Home Medications Prior to Admission medications   Medication Sig Start Date End Date Taking? Authorizing Provider  acetaminophen (TYLENOL) 500 MG tablet Take 500 mg by mouth every 6 (  six) hours as needed for moderate pain.   Yes [provider]  albuterol (VENTOLIN HFA) 108 (90 Base) MCG/ACT inhaler Inhale 2 puffs into the lungs every 6 (six) hours as needed for wheezing or shortness of breath. 07/23/19  Yes Collene Gobble, MD  amLODipine (NORVASC) 10 MG tablet Take 1 tablet (10 mg total) by mouth daily. 07/19/19  Yes [name], MD  atorvastatin (LIPITOR) 20 MG tablet Take 1 tablet (20 mg total) by mouth daily. 07/19/19  Yes [name], MD  benzonatate (TESSALON) 100 MG capsule TAKE 1 CAPSULE EVERY 8 HOURS AS NEEDED FOR  COUGH Patient taking differently: Take 100 mg by mouth 3 (three) times daily as needed for cough. 01/25/20  Yes Heilingoetter, Cassandra L, PA-C  chlorthalidone (HYGROTON) 50 MG tablet TAKE 1 TABLET EVERY DAY Patient taking differently: Take 50 mg by mouth daily. 11/30/19  Yes Sid Falcon, MD  dexamethasone (DECADRON) 4 MG tablet Please take TWO tablets TWICE a day the day before, the day of, and the day after chemotherapy Patient taking differently: Take 8 mg by mouth See admin instructions. Please take TWO tablets TWICE a day the day before, the day of, and the day after chemotherapy 12/30/19  Yes Heilingoetter, Cassandra L, PA-C  famotidine (PEPCID) 20 MG tablet Take 20 mg by mouth daily.    Yes [provider]  FLUoxetine (PROZAC) 20 MG capsule TAKE 1 CAPSULE EVERY DAY Patient taking differently: Take 20 mg by mouth daily. 11/30/19  Yes Sid Falcon, MD  fluticasone (FLONASE) 50 MCG/ACT nasal spray Place 1 spray into both nostrils daily. 10/18/19 11/17/19 Yes Oretha Milch D, MD  HYDROcodone-acetaminophen (NORCO) 7.5-325 MG tablet Take 1 tablet by mouth every 6 (six) hours as needed for moderate pain. 12/30/18  Yes Heilingoetter, Cassandra L, PA-C  lidocaine-prilocaine (EMLA) cream Apply 1 application topically as needed. Patient taking differently: Apply 1 application topically as needed (for chemotherapy). 01/13/20  Yes Heilingoetter, Cassandra L, PA-C  magnesium oxide (MAG-OX) 400 MG tablet Take 1 tablet (400 mg total) by mouth daily. 03/29/20  Yes Heilingoetter, Cassandra L, PA-C  polyvinyl alcohol (LIQUIFILM TEARS) 1.4 % ophthalmic solution Place 1 drop into both eyes as needed for dry eyes.   Yes [provider]  potassium chloride SA (KLOR-CON M20) 20 MEQ tablet Take 1 tablet (20 mEq total) by mouth daily. Patient not taking: No sig reported 04/12/20   Heilingoetter, Cassandra L, PA-C  tiotropium (SPIRIVA HANDIHALER) 18 MCG inhalation capsule Place 1 capsule (18 mcg total)  into inhaler and inhale daily. Patient not taking: Reported on 04/26/2020 12/29/19 12/28/20  [name], MD    Allergies    Codeine sulfate and Pantoprazole sodium  Review of Systems   Review of Systems  Constitutional: Negative for chills and fever.  HENT: Negative for ear pain and sore throat.   Eyes: Negative for pain and visual disturbance.  Respiratory: Negative for cough and shortness of breath.   Cardiovascular: Positive for chest pain. Negative for palpitations.  Gastrointestinal: Positive for abdominal pain. Negative for vomiting.  Genitourinary: Negative for dysuria and hematuria.  Musculoskeletal: Negative for arthralgias and back pain.  Skin: Negative for color change and rash.  Neurological: Negative for seizures and syncope.  All other systems reviewed and are negative.   Physical Exam Updated Vital Signs BP 111/70   Pulse 75   Temp 98.8 F (37.1 C)   Resp 12   Ht 5' 1"  (1.549 m)   Wt 60.1  kg   SpO2 94%   BMI 25.03 kg/m   Physical Exam Vitals and nursing note reviewed.  Constitutional:      General: She is not in acute distress.    Appearance: She is well-developed and well-nourished.  HENT:     Head: Normocephalic and atraumatic.  Eyes:     Conjunctiva/sclera: Conjunctivae normal.  Cardiovascular:     Rate and Rhythm: Normal rate and regular rhythm.     Heart sounds: Normal heart sounds. No murmur heard.   Pulmonary:     Effort: Pulmonary effort is normal. No respiratory distress.     Breath sounds: Normal breath sounds.  Abdominal:     Palpations: Abdomen is soft.     Tenderness: There is no abdominal tenderness.  Musculoskeletal:        General: No edema.     Cervical back: Neck supple.     Right lower leg: No edema.     Left lower leg: No edema.  Skin:    General: Skin is warm and dry.     Capillary Refill: Capillary refill takes less than 2 seconds.  Neurological:     General: No focal deficit present.     Mental Status: She  is alert and oriented to person, place, and time.  Psychiatric:        Mood and Affect: Mood and affect normal.     ED Results / Procedures / Treatments   Labs (all labs ordered are listed, but only abnormal results are displayed) Labs Reviewed  BASIC METABOLIC PANEL - Abnormal; Notable for the following components:      Result Value   Sodium 134 (*)    Potassium 3.0 (*)    Chloride 97 (*)    All other components within normal limits  CBC - Abnormal; Notable for the following components:   RBC 2.66 (*)    Hemoglobin 9.0 (*)    HCT 27.3 (*)    MCV 102.6 (*)    RDW 16.2 (*)    Platelets 123 (*)    nRBC 0.3 (*)    All other components within normal limits  HEPATIC FUNCTION PANEL - Abnormal; Notable for the following components:   Total Protein 6.4 (*)    Albumin 3.4 (*)    All other components within normal limits  LIPASE, BLOOD  TROPONIN I (HIGH SENSITIVITY)  TROPONIN I (HIGH SENSITIVITY)    EKG EKG Interpretation  Date/Time:  Wednesday April 26 2020 07:02:07 EST Ventricular Rate:  84 PR Interval:    QRS Duration: 101 QT Interval:  364 QTC Calculation: 431 R Axis:   31 Text Interpretation: Sinus rhythm Multiple premature complexes, vent & supraven Probable left atrial enlargement Borderline T abnormalities, anterior leads no acute stemi Confirmed by Madalyn Rob [phone]) on 04/26/2020 7:09:17 AM   Radiology DG Chest 2 View  Result Date: 04/26/2020 CLINICAL DATA:  Chest pain EXAM: CHEST - 2 VIEW COMPARISON:  10/10/2019 FINDINGS: Normal heart size and stable aortic tortuosity. Stable elevation of the left diaphragm. Porta catheter with tip at the SVC. There is no edema, consolidation, effusion, or pneumothorax. IMPRESSION: Stable exam.  No evidence of acute disease. Electronically Signed   By: Monte Fantasia M.D.   On: 04/26/2020 07:25   CT ABDOMEN PELVIS W CONTRAST  Result Date: 04/26/2020 CLINICAL DATA:  Severe epigastric pain. History of metastatic lung cancer.  EXAM: CT ABDOMEN AND PELVIS WITH CONTRAST TECHNIQUE: Multidetector CT imaging of the abdomen and pelvis was performed using the  standard protocol following bolus administration of intravenous contrast. CONTRAST:  192m OMNIPAQUE IOHEXOL 300 MG/ML  SOLN COMPARISON:  CT abdomen pelvis dated March 15, 2020. FINDINGS: Lower chest: No acute abnormality. Unchanged elevation of the left hemidiaphragm. Hepatobiliary: Several small cysts in the liver are unchanged. Status post cholecystectomy. Unchanged mild intra and extrahepatic biliary dilatation, likely due to post cholecystectomy state. Pancreas: Unremarkable. No pancreatic ductal dilatation or surrounding inflammatory changes. Spleen: Normal in size without focal abnormality. Adrenals/Urinary Tract: Adrenal glands are unremarkable. Kidneys are normal, without renal calculi, focal lesion, or hydronephrosis. Bladder is unremarkable. Stomach/Bowel: Stomach is within normal limits. Appendix is not visualized but there are no signs of inflammation at the base of the cecum. No evidence of bowel wall thickening, distention, or inflammatory changes. Vascular/Lymphatic: Aortic atherosclerosis. Unchanged fusiform aneurysmal dilatation of the distal descending thoracic and abdominal aorta measuring up to 4.3 cm (series 2, image 23), similar to prior study by my measurements. No surrounding inflammatory changes. No enlarged abdominal or pelvic lymph nodes. Reproductive: Status post hysterectomy. Unchanged 7.3 x 6.8 cm simple appearing cyst in the left ovary. Other: No abdominal wall hernia or abnormality. No abdominopelvic ascites. No pneumoperitoneum. Musculoskeletal: No acute or significant osseous findings. IMPRESSION: 1. No acute intra-abdominal process. 2. 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 Recommend follow-up every 12 months and vascular consultation. This recommendation follows ACR  consensus guidelines: White Paper of the ACR Incidental Findings Committee II on Vascular Findings. J Am Coll Radiol 2013; 10:789-794. 3. 7.3 cm simple appearing left ovarian cyst, unchanged since the prior study, but slowly increasing in size since 2018. Recommend follow-up [facility] 6-12 months. Note: This recommendation does not apply to premenarchal patients and to those with increased risk (genetic, family history, elevated tumor markers or other high-risk factors) of ovarian cancer. Reference: JACR 2020 Feb; 17(2):248-254 4. Aortic Atherosclerosis (ICD10-I70.0). Electronically Signed   By: WTitus DubinM.D.   On: 04/26/2020 11:25    Procedures Procedures   Medications Ordered in ED Medications  ondansetron (ZOFRAN) injection 4 mg (4 mg Intravenous Given 04/26/20 0745)  fentaNYL (SUBLIMAZE) injection 50 mcg (50 mcg Intravenous Given 04/26/20 0745)  iohexol (OMNIPAQUE) 300 MG/ML solution 100 mL (100 mLs Intravenous Contrast Given 04/26/20 1048)  HYDROmorphone (DILAUDID) injection 0.5 mg (0.5 mg Intravenous Given 04/26/20 1027)  heparin lock flush 100 unit/mL (500 Units Intracatheter Given 04/26/20 1238)  ondansetron (ZOFRAN-ODT) disintegrating tablet 4 mg (4 mg Oral Given 04/26/20 1344)    ED Course  I have reviewed the triage vital signs and the nursing notes.  Pertinent labs & imaging results that were available during my care of the patient were reviewed by me and considered in my medical decision making (see chart for details).    MDM Rules/Calculators/A&P                         75year old lady presents to ER with concern for upper abdominal/lower central chest pain.  On exam patient noted to be well-appearing in no distress.  EKG did not have acute ischemic changes and troponin x2 was within normal limits.  Doubt ACS.  Patient was not hypertensive, pain did not radiate to back, low suspicion for dissection.  CT abdomen pelvis was obtained to rule out acute abdominal pathology.  Redemonstrated a  abdominal aortic aneurysm but there was no acute change, no evidence for rupture.  LFTs within normal limits, lipase within  normal limits.  Given reassuring work-up, symptoms resolved on reassessment, believe patient can be discharged and managed in the outpatient setting.  Recommend she have close follow-up with her oncologist as well as primary doctor.  Additionally recommended follow-up with vascular surgery for ongoing monitoring of her aortic aneurysm.   After the discussed management above, the patient was determined to be safe for discharge.  The patient was in agreement with this plan and all questions regarding their care were answered.  ED return precautions were discussed and the patient will return to the ED with any significant worsening of condition.   Final Clinical Impression(s) / ED Diagnoses Final diagnoses:  Abdominal aortic aneurysm (AAA) without rupture (H[facility])  Epigastric pain    Rx / DC Orders ED Discharge Orders    None       Lucrezia Starch, MD 04/26/20 [phone]

## 2020-04-27 ENCOUNTER — Telehealth: Payer: Self-pay

## 2020-04-27 NOTE — Telephone Encounter (Signed)
Transition Care Management Unsuccessful Follow-up Telephone Call  Date of discharge and from where:  05/16/2020 from [facility]  Attempts:  1st Attempt  Reason for unsuccessful TCM follow-up call:  Left voice message

## 2020-04-28 NOTE — Telephone Encounter (Signed)
Transition Care Management Follow-up Telephone Call  Date of discharge and from where: 04/26/2020 from [facility]  How have you been since you were released from the hospital? Pt stated that she is feeling.   Any questions or concerns? No  Items Reviewed:  Did the pt receive and understand the discharge instructions provided? Yes   Medications obtained and verified? Yes   Other? No   Any new allergies since your discharge? No   Dietary orders reviewed? NA  Do you have support at home? Yes   Functional Questionnaire: (I = Independent and D = Dependent) ADLs: I  Bathing/Dressing- I  Meal Prep- D   Eating- I  Maintaining continence- I  Transferring/Ambulation- I  Managing Meds- I  Follow up appointments reviewed:   PCP Hospital f/u appt confirmed? No  Pt informed to call PCP to schedule hospital follow up.   [facility] f/u appt confirmed? No  Pt informed to call Vascular Surgery.   Are transportation arrangements needed? No  If their condition worsens, is the pt aware to call PCP or go to the Emergency Dept.? Yes Was the patient provided with contact information for the PCP's office or ED? Yes Was to pt encouraged to call back with questions or concerns? Yes

## 2020-05-03 ENCOUNTER — Other Ambulatory Visit: Payer: Self-pay

## 2020-05-03 ENCOUNTER — Inpatient Hospital Stay: Payer: Medicare HMO | Attending: Physician Assistant

## 2020-05-03 DIAGNOSIS — Z452 Encounter for adjustment and management of vascular access device: Secondary | ICD-10-CM | POA: Insufficient documentation

## 2020-05-03 DIAGNOSIS — R300 Dysuria: Secondary | ICD-10-CM | POA: Insufficient documentation

## 2020-05-03 DIAGNOSIS — C3491 Malignant neoplasm of unspecified part of right bronchus or lung: Secondary | ICD-10-CM | POA: Insufficient documentation

## 2020-05-03 DIAGNOSIS — Z95828 Presence of other vascular implants and grafts: Secondary | ICD-10-CM

## 2020-05-03 LAB — CBC WITH DIFFERENTIAL (CANCER CENTER ONLY)
Abs Immature Granulocytes: 0.17 10*3/uL — ABNORMAL HIGH (ref 0.00–0.07)
Basophils Absolute: 0 10*3/uL (ref 0.0–0.1)
Basophils Relative: 0 %
Eosinophils Absolute: 0 10*3/uL (ref 0.0–0.5)
Eosinophils Relative: 0 %
HCT: 29.3 % — ABNORMAL LOW (ref 36.0–46.0)
Hemoglobin: 9.5 g/dL — ABNORMAL LOW (ref 12.0–15.0)
Immature Granulocytes: 2 %
Lymphocytes Relative: 16 %
Lymphs Abs: 1.6 10*3/uL (ref 0.7–4.0)
MCH: 33 pg (ref 26.0–34.0)
MCHC: 32.4 g/dL (ref 30.0–36.0)
MCV: 101.7 fL — ABNORMAL HIGH (ref 80.0–100.0)
Monocytes Absolute: 0.5 10*3/uL (ref 0.1–1.0)
Monocytes Relative: 5 %
Neutro Abs: 7.6 10*3/uL (ref 1.7–7.7)
Neutrophils Relative %: 77 %
Platelet Count: 160 10*3/uL (ref 150–400)
RBC: 2.88 MIL/uL — ABNORMAL LOW (ref 3.87–5.11)
RDW: 16.6 % — ABNORMAL HIGH (ref 11.5–15.5)
WBC Count: 9.8 10*3/uL (ref 4.0–10.5)
nRBC: 0.3 % — ABNORMAL HIGH (ref 0.0–0.2)

## 2020-05-03 LAB — CMP (CANCER CENTER ONLY)
ALT: 7 U/L (ref 0–44)
AST: 14 U/L — ABNORMAL LOW (ref 15–41)
Albumin: 3.6 g/dL (ref 3.5–5.0)
Alkaline Phosphatase: 83 U/L (ref 38–126)
Anion gap: 9 (ref 5–15)
BUN: 20 mg/dL (ref 8–23)
CO2: 27 mmol/L (ref 22–32)
Calcium: 9.5 mg/dL (ref 8.9–10.3)
Chloride: 101 mmol/L (ref 98–111)
Creatinine: 0.84 mg/dL (ref 0.44–1.00)
GFR, Estimated: >60 mL/min (ref >60)
Glucose, Bld: 91 mg/dL (ref 70–99)
Potassium: 3.3 mmol/L — ABNORMAL LOW (ref 3.5–5.1)
Sodium: 137 mmol/L (ref 135–145)
Total Bilirubin: 0.7 mg/dL (ref 0.3–1.2)
Total Protein: 7.2 g/dL (ref 6.5–8.1)

## 2020-05-03 LAB — TOTAL PROTEIN, URINE DIPSTICK: Protein, ur: NEGATIVE mg/dL

## 2020-05-03 MED ORDER — SODIUM CHLORIDE 0.9% FLUSH
10.0000 mL | INTRAVENOUS | Status: DC | PRN
  Administered 2020-05-03: 10 mL
  Filled 2020-05-03: qty 10

## 2020-05-03 MED ORDER — HEPARIN SOD (PORK) LOCK FLUSH 100 UNIT/ML IV SOLN
500.0000 [IU] | Freq: Once | INTRAVENOUS | Status: AC | PRN
Start: 2020-05-03 — End: 2020-05-03
  Administered 2020-05-03: 500 [IU]
  Filled 2020-05-03: qty 5

## 2020-05-03 NOTE — Patient Instructions (Signed)
Implanted [facility] Guide An implanted port is a device that is placed under the skin. It is usually placed in the chest. The device can be used to give IV medicine, to take blood, or for dialysis. You may have an implanted port if:  You need IV medicine that would be irritating to the small veins in your hands or arms.  You need IV medicines, such as antibiotics, for a long period of time.  You need IV nutrition for a long period of time.  You need dialysis. When you have a port, your health care provider can choose to use the port instead of veins in your arms for these procedures. You may have fewer limitations when using a port than you would if you used other types of long-term IVs, and you will likely be able to return to normal activities after your incision heals. An implanted port has two main parts:  Reservoir. The reservoir is the part where a needle is inserted to give medicines or draw blood. The reservoir is round. After it is placed, it appears as a small, raised area under your skin.  Catheter. The catheter is a thin, flexible tube that connects the reservoir to a vein. Medicine that is inserted into the reservoir goes into the catheter and then into the vein. How is my port accessed? To access your port:  A numbing cream may be placed on the skin over the port site.  Your health care provider will put on a mask and sterile gloves.  The skin over your port will be cleaned carefully with a germ-killing soap and allowed to dry.  Your health care provider will gently pinch the port and insert a needle into it.  Your health care provider will check for a blood return to make sure the port is in the vein and is not clogged.  If your port needs to remain accessed to get medicine continuously (constant infusion), your health care provider will place a clear bandage (dressing) over the needle site. The dressing and needle will need to be changed every week, or as told by your  health care provider. What is flushing? Flushing helps keep the port from getting clogged. Follow instructions from your health care provider about how and when to flush the port. Ports are usually flushed with saline solution or a medicine called heparin. The need for flushing will depend on how the port is used:  If the port is only used from time to time to give medicines or draw blood, the port may need to be flushed: ? Before and after medicines have been given. ? Before and after blood has been drawn. ? As part of routine maintenance. Flushing may be recommended every 4-6 weeks.  If a constant infusion is running, the port may not need to be flushed.  Throw away any syringes in a disposal container that is meant for sharp items (sharps container). You can buy a sharps container from a pharmacy, or you can make one by using an empty hard plastic bottle with a cover. How long will my port stay implanted? The port can stay in for as long as your health care provider thinks it is needed. When it is time for the port to come out, a surgery will be done to remove it. The surgery will be similar to the procedure that was done to put the port in. Follow these instructions at home:  Flush your port as told by your health care  provider.  If you need an infusion over several days, follow instructions from your health care provider about how to take care of your port site. Make sure you: ? Wash your hands with soap and water before you change your dressing. If soap and water are not available, use alcohol-based hand sanitizer. ? Change your dressing as told by your health care provider. ? Place any used dressings or infusion bags into a plastic bag. Throw that bag in the trash. ? Keep the dressing that covers the needle clean and dry. Do not get it wet. ? Do not use scissors or sharp objects near the tube. ? Keep the tube clamped, unless it is being used.  Check your port site every day for signs  of infection. Check for: ? Redness, swelling, or pain. ? Fluid or blood. ? Pus or a bad smell.  Protect the skin around the port site. ? Avoid wearing bra straps that rub or irritate the site. ? Protect the skin around your port from seat belts. Place a soft pad over your chest if needed.  Bathe or shower as told by your health care provider. The site may get wet as long as you are not actively receiving an infusion.  Return to your normal activities as told by your health care provider. Ask your health care provider what activities are safe for you.  Carry a medical alert card or wear a medical alert bracelet at all times. This will let health care providers know that you have an implanted port in case of an emergency.   Get help right away if:  You have redness, swelling, or pain at the port site.  You have fluid or blood coming from your port site.  You have pus or a bad smell coming from the port site.  You have a fever. Summary  Implanted ports are usually placed in the chest for long-term IV access.  Follow instructions from your health care provider about flushing the port and changing bandages (dressings).  Take care of the area around your port by avoiding clothing that puts pressure on the area, and by watching for signs of infection.  Protect the skin around your port from seat belts. Place a soft pad over your chest if needed.  Get help right away if you have a fever or you have redness, swelling, pain, drainage, or a bad smell at the port site. This information is not intended to replace advice given to you by your health care provider. Make sure you discuss any questions you have with your health care provider. Document Revised: 07/26/2019 Document Reviewed: 07/26/2019 Elsevier Patient Education  [place].

## 2020-05-05 ENCOUNTER — Telehealth: Payer: Self-pay | Admitting: Physician Assistant

## 2020-05-05 NOTE — Telephone Encounter (Signed)
I noticed the patient is set up for a restaging CT scan of the C/A/P on 05/08/20. However, she was recently in the ER and they did a CT of the A/P. I called radiology to make sure they only perform the CT of the chest. I called the patient and left a voicemail letting her know she does not need to drink the contrast since they are only doing a CT of the chest. If she has any questions about the instructions, I encouraged her to call us back.

## 2020-05-05 NOTE — Progress Notes (Signed)
[place] OFFICE PROGRESS NOTE  [name], MD [address] [place] [address]  DIAGNOSIS: Stage IV non-small cell lung cancer, squamous cell carcinoma. She presented withright upper lobe lung mass in addition to pleural-based metastasis andmediastinal lymphadenopathy.She was diagnosed in September 2020.  PRIOR THERAPY: Chemotherapy with carboplatin for an AUC of 5,paclitaxel 175 mg/m, and Keytruda 200 mg IV every 3 weeks with Neulasta support.Last dose9/15/21.Status post18cycles. Starting from cycle #5wason maintenance treatment with single agent Keytruda every 3 weeks.This was discontinued due to evidence of disease progression.  CURRENT THERAPY:  Palliative systemic chemotherapy with docetaxel 75 mg per metered squared and Cyramza 10 mg/kg IV every 3 weeks with Neulasta support. First dose on 01/04/20. Status post6cycles.  INTERVAL HISTORY: Debra Rodgers 75 y.o. female returns to the clinic today for a follow-up visit.  In the interval since her last appointment, the patient presented to the emergency room on 04/26/2020 for the chief complaint of left lower rib pain/light upper abdominal pain.  The patient has been experiencing this pain on and off for over a year.  No clear etiology has been seen on imaging studies.  She often will take a Tylenol with improvement in her symptoms. When her pain is more significant, she tells me she takes a norco. When she went to the emergency room, they performed an EKG, troponins, LFTs, lipase, and a CT of the abdomen and pelvis which did not show any cause of her symptoms.  The patient was discharged. She followed up with vascular surgery yesterday regarding an abdominal aneurysm. Her vascular surgeon did not recommend elective repair of the aneurysm to prevent rupture because it is not yet at size threshold (>5cm in Female) and her low life expectancy (7-12 mo with Stage IV NSCLC).  Otherwise, the patient  is feeling fairly well today.  She denies any fever or weight loss.  The patient reports that her appetite comes and goes and she drinks approximately 1 Ensure per day.  The patient reports her baseline intermittent night sweats.  She reports her baseline dyspnea on exertion and cough.  She denies any hemoptysis.  She denies any nausea, vomiting, or constipation.  She experiences baseline intermittent diarrhea on and off for several years that "depends on what she eats".  She denies any rashes or skin changes. She has been reporting headaches recently. She had an MRI of the brain performed a few months ago to evaluate her headaches. There was no evidence of metastatic disease to the brain or a clear etiology of her headaches. The patient recently had a restaging CT scan of the chest performed.  She is here today for evaluation and to review her scan results before starting cycle #7 of her treatment.    MEDICAL HISTORY: Past Medical History:  Diagnosis Date  . AAA (abdominal aortic aneurysm) ([place])   . COPD (chronic obstructive pulmonary disease) ([place])   . Depression   . Essential hypertension   . Headache   . History of migraine headaches   . lung ca dx'd 10/2018  . Sleep apnea   . Tobacco use disorder     ALLERGIES:  is allergic to codeine sulfate and pantoprazole sodium.  MEDICATIONS:  Current Outpatient Medications  Medication Sig Dispense Refill  . acetaminophen (TYLENOL) 500 MG tablet Take 500 mg by mouth every 6 (six) hours as needed for moderate pain.    Marland Kitchen albuterol (VENTOLIN HFA) 108 (90 Base) MCG/ACT inhaler Inhale 2 puffs into the  lungs every 6 (six) hours as needed for wheezing or shortness of breath. 36 g 2  . amLODipine (NORVASC) 10 MG tablet Take 1 tablet (10 mg total) by mouth daily. 90 tablet 3  . atorvastatin (LIPITOR) 20 MG tablet Take 1 tablet (20 mg total) by mouth daily. 90 tablet 3  . benzonatate (TESSALON) 100 MG capsule TAKE 1 CAPSULE EVERY 8 HOURS AS NEEDED FOR COUGH  (Patient taking differently: Take 100 mg by mouth 3 (three) times daily as needed for cough.) 90 capsule 0  . chlorthalidone (HYGROTON) 50 MG tablet TAKE 1 TABLET EVERY DAY (Patient taking differently: Take 50 mg by mouth daily.) 90 tablet 3  . dexamethasone (DECADRON) 4 MG tablet Please take TWO tablets TWICE a day the day before, the day of, and the day after chemotherapy (Patient taking differently: Take 8 mg by mouth See admin instructions. Please take TWO tablets TWICE a day the day before, the day of, and the day after chemotherapy) 120 tablet 2  . famotidine (PEPCID) 20 MG tablet Take 20 mg by mouth daily.     Marland Kitchen FLUoxetine (PROZAC) 20 MG capsule TAKE 1 CAPSULE EVERY DAY (Patient taking differently: Take 20 mg by mouth daily.) 90 capsule 3  . gabapentin (NEURONTIN) 100 MG capsule Take 1 capsule (100 mg total) by mouth 2 (two) times daily. 90 capsule 2  . HYDROcodone-acetaminophen (NORCO) 7.5-325 MG tablet Take 1 tablet by mouth every 6 (six) hours as needed for moderate pain. 60 tablet 0  . lidocaine-prilocaine (EMLA) cream Apply 1 application topically as needed. (Patient taking differently: Apply 1 application topically as needed (for chemotherapy).) 30 g 0  . magnesium oxide (MAG-OX) 400 MG tablet Take 1 tablet (400 mg total) by mouth daily. 30 tablet 1  . polyvinyl alcohol (LIQUIFILM TEARS) 1.4 % ophthalmic solution Place 1 drop into both eyes as needed for dry eyes.    . potassium chloride SA (KLOR-CON M20) 20 MEQ tablet Take 1 tablet (20 mEq total) by mouth daily. 10 tablet 0  . tiotropium (SPIRIVA HANDIHALER) 18 MCG inhalation capsule Place 1 capsule (18 mcg total) into inhaler and inhale daily. 30 capsule 2  . fluticasone (FLONASE) 50 MCG/ACT nasal spray Place 1 spray into both nostrils daily. 11.1 mL 0   No current facility-administered medications for this visit.    SURGICAL HISTORY:  Past Surgical History:  Procedure Laterality Date  . ABDOMINAL HYSTERECTOMY    . CHOLECYSTECTOMY     . IR IMAGING GUIDED PORT INSERTION  02/24/2019  . ROTATOR CUFF REPAIR  3/04  . VIDEO BRONCHOSCOPY WITH ENDOBRONCHIAL ULTRASOUND Right 11/25/2018   Procedure: VIDEO BRONCHOSCOPY WITH ENDOBRONCHIAL ULTRASOUND;  Surgeon: Collene Gobble, MD;  Location: [facility];  Service: Thoracic;  Laterality: Right;    REVIEW OF SYSTEMS:   Review of Systems  Constitutional: Positive for fatigue. Negative for weight loss and fever HENT:  Negative for nosebleeds, sore throat and trouble swallowing.   Eyes: Negative for eye problems and icterus.  Respiratory: Positive for mild cough and baseline dyspnea on exertion. Negative for hemoptysisand wheezing.   Cardiovascular: Positive for intermittent left lower rib pain (none at this time). Negative for leg swelling.  Gastrointestinal:Positive forbaseline intermittentdiarrhea(controlled).Negative for abdominal pain, constipation, nausea and vomiting. Genitourinary: Negative for bladder incontinence, difficulty urinating, dysuria, frequency and hematuria.   Musculoskeletal: Negative for back pain, gait problem, neck pain and neck stiffness.  Skin: Negative for itching and rash.  Neurological: Positive for headaches. Negative for dizziness, extremity weakness, gait  problem, light-headedness and seizures.  Hematological: Negative for adenopathy. Does not bruise/bleed easily.  Psychiatric/Behavioral: Negative for confusion, depression and sleep disturbance. The patient is not nervous/anxious.     PHYSICAL EXAMINATION:  Blood pressure 119/80, pulse 89, temperature 97.6 F (36.4 C), temperature source Tympanic, resp. rate 14, height 5' 1"  (1.549 m), weight 136 lb 6.4 oz (61.9 kg), SpO2 100 %.  ECOG PERFORMANCE STATUS: 1 - Symptomatic but completely ambulatory  Physical Exam  Constitutional: Oriented to person, place, and time and well-developed, well-nourished, and in no distress.  HENT:  Head: Normocephalic and atraumatic.  Mouth/Throat: Oropharynx is clear and  moist. No oropharyngeal exudate.  Eyes: Conjunctivae are normal. Right eye exhibits no discharge. Left eye exhibits no discharge. No scleral icterus.  Neck: Normal range of motion. Neck supple.  Cardiovascular: Normal rate, regular rhythm, normal heart sounds and intact distal pulses.  Pulmonary/Chest: Effort normal and breath sounds normal. No respiratory distress. No wheezes. No rales.  Abdominal: Soft. Bowel sounds are normal. Exhibits no distension and no mass. There is no tenderness.  Musculoskeletal: Tenderness to palpation on the lateral left chest wall.Normal range of motion. Exhibits no edema.  Lymphadenopathy:  No cervical adenopathy.  Neurological: Alert and oriented to person, place, and time. Exhibits normal muscle tone. Gait normal. Coordination normal.  Skin: Skin is warm and dry. No rash noted. Not diaphoretic. No erythema. No pallor.  Psychiatric: Mood, memory and judgment normal.  Vitals reviewed.  LABORATORY DATA: Lab Results  Component Value Date   WBC 7.6 05/10/2020   HGB 8.4 (L) 05/10/2020   HCT 26.0 (L) 05/10/2020   MCV 102.0 (H) 05/10/2020   PLT 346 05/10/2020      Chemistry      Component Value Date/Time   NA 136 05/10/2020 1025   NA 139 07/21/2017 0958   K 3.6 05/10/2020 1025   CL 101 05/10/2020 1025   CO2 25 05/10/2020 1025   BUN 17 05/10/2020 1025   BUN 26 07/21/2017 0958   CREATININE 1.00 05/10/2020 1025   CREATININE 0.79 09/03/2013 1540      Component Value Date/Time   CALCIUM 9.3 05/10/2020 1025   ALKPHOS 61 05/10/2020 1025   AST 15 05/10/2020 1025   ALT 10 05/10/2020 1025   BILITOT 0.5 05/10/2020 1025       RADIOGRAPHIC STUDIES:  DG Chest 2 View  Result Date: 04/26/2020 CLINICAL DATA:  Chest pain EXAM: CHEST - 2 VIEW COMPARISON:  10/10/2019 FINDINGS: Normal heart size and stable aortic tortuosity. Stable elevation of the left diaphragm. Porta catheter with tip at the SVC. There is no edema, consolidation, effusion, or pneumothorax.  IMPRESSION: Stable exam.  No evidence of acute disease. Electronically Signed   By: Monte Fantasia M.D.   On: 04/26/2020 07:25   CT Chest W Contrast  Result Date: 05/08/2020 CLINICAL DATA:  Non-small cell lung cancer staging in a 75 year old female. EXAM: CT CHEST WITH CONTRAST TECHNIQUE: Multidetector CT imaging of the chest was performed during intravenous contrast administration. CONTRAST:  28m OMNIPAQUE IOHEXOL 300 MG/ML  SOLN COMPARISON:  03/15/2020 FINDINGS: Cardiovascular: 4 cm ascending thoracic aorta. Normal heart size. 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 No pericardial effusion. Descending thoracic aorta is ectatic, dilated to approximately 3.6 cm above the aortic hiatus, unchanged. Central pulmonary vasculature with stable appearance maximal caliber approximately 3.3 cm. Mediastinum/Nodes: No adenopathy in the chest. Mild fullness of nodal tissue along LEFT paratracheal chain ear AP window and RIGHT paratracheal chain approximately 8-9 mm similar  to the prior study. No hilar adenopathy. Lungs/Pleura: Paraseptal and centrilobular emphysema, moderate to marked and worse at the lung apices with similar appearance. No consolidation. No pleural effusion. Basilar atelectasis. Airways are patent. Upper Abdomen: Incidental imaging of upper abdominal contents shows no acute process. Stable low-density lesion in the RIGHT hepatic lobe, likely a cyst. Imaged portions of pancreas, spleen, adrenal glands and kidneys is unremarkable. Aneurysmal dilation of the infrarenal abdominal aorta 3.9 x 3.4 cm with similar appearance with respect imaged portions compared to the prior study. Also dilation of the suprarenal abdominal aorta to approximately 4.1 x 3.6 cm also within 1 mm of previous size. Musculoskeletal: Spinal degenerative changes. No acute or destructive bone process. IMPRESSION: 1. No evidence of recurrent or metastatic disease in the chest. 2. Index nodules on the prior study  are no longer seen as discrete nodules, for instance the area in the LEFT lower lobe superior segment is very subtle ground-glass measuring approximately 3 mm on image 41 of series 5. 3. 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 4. 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 5. Emphysema and aortic atherosclerosis. Aortic Atherosclerosis (ICD10-I70.0) and Emphysema (ICD10-J43.9). Electronically Signed   By: Zetta Bills M.D.   On: 05/08/2020 15:02   CT ABDOMEN PELVIS W CONTRAST  Result Date: 04/26/2020 CLINICAL DATA:  Severe epigastric pain. History of metastatic lung cancer. EXAM: CT ABDOMEN AND PELVIS WITH CONTRAST TECHNIQUE: Multidetector CT imaging of the abdomen and pelvis was performed using the standard protocol following bolus administration of intravenous contrast. CONTRAST:  17m OMNIPAQUE IOHEXOL 300 MG/ML  SOLN COMPARISON:  CT abdomen pelvis dated March 15, 2020. FINDINGS: Lower chest: No acute abnormality. Unchanged elevation of the left hemidiaphragm. Hepatobiliary: Several small cysts in the liver are unchanged. Status post cholecystectomy. Unchanged mild intra and extrahepatic biliary dilatation, likely due to post cholecystectomy state. Pancreas: Unremarkable. No pancreatic ductal dilatation or surrounding inflammatory changes. Spleen: Normal in size without focal abnormality. Adrenals/Urinary Tract: Adrenal glands are unremarkable. Kidneys are normal, without renal calculi, focal lesion, or hydronephrosis. Bladder is unremarkable. Stomach/Bowel: Stomach is within normal limits. Appendix is not visualized but there are no signs of inflammation at the base of the cecum. No evidence of bowel wall thickening, distention, or inflammatory changes. Vascular/Lymphatic: Aortic atherosclerosis. 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 No surrounding inflammatory changes. No enlarged abdominal or pelvic lymph nodes. Reproductive: Status post hysterectomy. Unchanged 7.3 x 6.8 cm simple appearing cyst in the left ovary. Other: No abdominal wall hernia or abnormality. No abdominopelvic ascites. No pneumoperitoneum. Musculoskeletal: No acute or significant osseous findings. IMPRESSION: 1. No acute intra-abdominal process. 2. 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 Recommend follow-up every 12 months and vascular consultation. This recommendation follows ACR consensus guidelines: White Paper of the ACR Incidental Findings Committee II on Vascular Findings. J Am Coll Radiol 2013; 10:789-794. 3. 7.3 cm simple appearing left ovarian cyst, unchanged since the prior study, but slowly increasing in size since 2018. Recommend follow-up [facility] 6-12 months. Note: This recommendation does not apply to premenarchal patients and to those with increased risk (genetic, family history, elevated tumor markers or other high-risk factors) of ovarian cancer. Reference: JACR 2020 Feb; 17(2):248-254 4. Aortic Atherosclerosis (ICD10-I70.0). Electronically Signed  By: Titus Dubin M.D.   On: 04/26/2020 11:25     ASSESSMENT/PLAN:  This is a very pleasant 75 year old African-American female diagnosed with stage IV non-small cell lung cancer, squamous cell carcinoma. She presented with a right upper lobe lung mass and pleural based metastases and mediastinal lymphadenopathy.She was diagnosed in August 2020   The patientwas onsystemicchemotherapy with carboplatin for an AUC of 5, paclitaxel 175 mg/m, and Keytruda 200 mg IV every 3 weeks with Neulasta support.Starting from cycle #5, shewas onmaintenance with single agent Keytruda.She is status post18cyclesof treatment and she tolerated it wellwithout any  concerning adverse effectsexcept for mild fatigue.This was discontinued secondary to evidence of disease progression.  The patient is currently undergoing palliative systemic chemotherapy with docetaxel 75 mg per metered squared and Cyramza 10 mg/kg IV every 3 weeks with Neulasta support. She is status post6cycles and she is tolerating this well.   The patient recently had a restaging CT scan performed.  Dr. Julien Nordmann personally and independently reviewed the scan and discussed the results with the patient today.  The scan did not show any evidence for disease progression.  Dr. Julien Nordmann recommends that the patient continue on the same treatment at the same dose.  She will proceed with cycle #7 today scheduled.  Her hemoglobin continues to trend down. We will start considering a blood transfusion if Hbg <8. She may require a blood transfusion in the near future. I discussed this with her and will arrange for sample to blood banks on weekly labs for the next few weeks.   Her reports peripheral neuropathy, I started her on gabapentin. We will titrate up and will review this at her next appointment.   She will continue to take tylenol for her occasional rib and LUQ pain as well as her headaches. She had a brain MRI performed a few months ago for her headaches which was negative for metastatic disease to the brain.   We will see her back for follow-up visit in 3 weeks for evaluation before starting cycle #8. The patient was advised to call immediately if she has any concerning symptoms in the interval. The patient voices understanding of current disease status and treatment options and is in agreement with the current care plan. All questions were answered. The patient knows to call the clinic with any problems, questions or concerns. We can certainly see the patient much sooner if necessary    Orders Placed This Encounter  Procedures  . Sample to Blood Bank    Standing Status:   Future     Standing Expiration Date:   05/10/2021  . Sample to Blood Bank    Standing Status:   Standing    Number of Occurrences:   2    Standing Expiration Date:   05/10/2021     I spent 20-29 minutes in this encounter.   Yaremi Stahlman L Lexey Fletes, PA-C 05/10/20  ADDENDUM: Hematology/Oncology Attending: I had a face-to-face encounter with the patient today.  I reviewed her record, scan and recommended her care plan.  This is a very pleasant 75 years old African-American female diagnosed with a stage IV non-small cell lung cancer, squamous cell carcinoma in August 2020 status post induction systemic chemotherapy with carboplatin, paclitaxel and Keytruda for 4 cycles. The patient is currently on maintenance treatment with single agent Keytruda status post a total of 18 cycles of treatment and has been tolerating this treatment well with no concerning complaints except for mild fatigue. She had repeat CT scan of the  chest, abdomen pelvis performed recently.  I personally and independently reviewed the scan and discussed the results with the patient today. Her scan showed no concerning finding for disease progression. I recommended for the patient to continue her current treatment with [facility] and she will proceed with cycle #19 today. The patient will come back for follow-up visit in 3 weeks for evaluation before the next cycle of her treatment. She was advised to call immediately if she has any other concerning symptoms in the interval.  Disclaimer: This note was dictated with voice recognition software. Similar sounding words can inadvertently be transcribed and may be missed upon review. Eilleen Kempf, MD 05/10/20

## 2020-05-08 ENCOUNTER — Other Ambulatory Visit: Payer: Self-pay

## 2020-05-08 ENCOUNTER — Ambulatory Visit (HOSPITAL_COMMUNITY)
Admission: RE | Admit: 2020-05-08 | Discharge: 2020-05-08 | Disposition: A | Discharge status: Home / Self Care | Payer: Medicare HMO | Source: Ambulatory Visit | Attending: Internal Medicine | Admitting: Internal Medicine

## 2020-05-08 ENCOUNTER — Encounter (HOSPITAL_COMMUNITY): Payer: Self-pay

## 2020-05-08 DIAGNOSIS — C3490 Malignant neoplasm of unspecified part of unspecified bronchus or lung: Secondary | ICD-10-CM | POA: Diagnosis not present

## 2020-05-08 MED ORDER — IOHEXOL 300 MG/ML  SOLN
75.0000 mL | Freq: Once | INTRAMUSCULAR | Status: AC | PRN
  Administered 2020-05-08: 75 mL via INTRAVENOUS

## 2020-05-08 MED ORDER — HEPARIN SOD (PORK) LOCK FLUSH 100 UNIT/ML IV SOLN
INTRAVENOUS | Status: AC
  Administered 2020-05-08: 500 [IU]
  Filled 2020-05-08: qty 5

## 2020-05-09 ENCOUNTER — Encounter: Payer: Self-pay | Admitting: Vascular Surgery

## 2020-05-09 ENCOUNTER — Ambulatory Visit: Payer: Medicare HMO | Admitting: Vascular Surgery

## 2020-05-09 VITALS — BP 122/78 | HR 81 | Temp 98.1°F | Resp 20 | Ht 61.0 in | Wt 134.0 lb

## 2020-05-09 DIAGNOSIS — I716 Thoracoabdominal aortic aneurysm, without rupture, unspecified: Secondary | ICD-10-CM

## 2020-05-09 NOTE — Progress Notes (Signed)
ASSESSMENT & PLAN:  Debra Rodgers is a 75 y.o. female with an extent IV thoracoabdominal aneurysm in setting of stage IV non-small cell lung cancer for which she is undergoing palliative chemotherapy.  I do not think the aneurysm is the cause of her abdominal pain.  A statement from the [place] [employer] and Society for Vascular Surgery estimated the annual rupture risk according to AAA diameter to be the following: 4.0 cm to 4.9 cm in diameter - 0.5% to 5%  The patient is not a candidate for elective repair of the aneurysm to prevent rupture because it is not yet at size threshold (>5cm in Female) and her low life expectancy (7-12 mo with Stage IV NSCLC).  Follow up with me in 6 months with Duplex Ultrasound of AAA, but only consider elective repair should she have a excellent response to palliative chemotherapy..   CHIEF COMPLAINT:   aneurysm  HISTORY:  HISTORY OF PRESENT ILLNESS: Debra Rodgers is a 75 y.o. female referred for evaluation of abdominal aortic aneurysm demonstrated on CT scan in the [facility] 04/26/2020.  He has a history of stage IV non-small cell lung cancer for which she is undergoing palliative chemotherapy as directed by Dr. Earlie Server.  She also has a history of DVT.  From an aneurysm perspective, she is asymptomatic.  Past Medical History:  Diagnosis Date  . AAA (abdominal aortic aneurysm) ([place])   . COPD (chronic obstructive pulmonary disease) ([place])   . Depression   . Essential hypertension   . Headache   . History of migraine headaches   . lung ca dx'd 10/2018  . Sleep apnea   . Tobacco use disorder     Past Surgical History:  Procedure Laterality Date  . ABDOMINAL HYSTERECTOMY    . CHOLECYSTECTOMY    . IR IMAGING GUIDED PORT INSERTION  02/24/2019  . ROTATOR CUFF REPAIR  3/04  . VIDEO BRONCHOSCOPY WITH ENDOBRONCHIAL ULTRASOUND Right 11/25/2018   Procedure: VIDEO BRONCHOSCOPY WITH ENDOBRONCHIAL ULTRASOUND;   Surgeon: Collene Gobble, MD;  Location: [facility] OR;  Service: Thoracic;  Laterality: Right;    Family History  Problem Relation Age of Onset  . Hypertension Mother   . Stroke Mother   . Coronary artery disease Mother   . Heart disease Father   . Diabetes Sister   . Hypertension Sister   . Cancer Sister   . Breast cancer Sister     Social History   Socioeconomic History  . Marital status: Married    Spouse name: Not on file  . Number of children: Not on file  . Years of education: Not on file  . Highest education level: Not on file  Occupational History  . Not on file  Tobacco Use  . Smoking status: Former Smoker    Packs/day: 1.00    Years: 56.00    Pack years: 56.00    Types: Cigarettes    Start date: 1964    Quit date: 11/04/2018    Years since quitting: 1.5  . Smokeless tobacco: Never Used  Vaping Use  . Vaping Use: Never used  Substance and Sexual Activity  . Alcohol use: No    Alcohol/week: 0.0 standard drinks  . Drug use: No  . Sexual activity: Never    Partners: Male  Other Topics Concern  . Not on file  Social History Narrative   Married, Regular Exercise- yes   Social Determinants of Health   Financial  Resource Strain: Not on file  Food Insecurity: Not on file  Transportation Needs: Not on file  Physical Activity: Not on file  Stress: Not on file  Social Connections: Not on file  Intimate Partner Violence: Not on file    Allergies  Allergen Reactions  . Codeine Sulfate     REACTION: "makes me high"  . Pantoprazole Sodium Swelling and Rash    facial swelling    Current Outpatient Medications  Medication Sig Dispense Refill  . acetaminophen (TYLENOL) 500 MG tablet Take 500 mg by mouth every 6 (six) hours as needed for moderate pain.    Marland Kitchen albuterol (VENTOLIN HFA) 108 (90 Base) MCG/ACT inhaler Inhale 2 puffs into the lungs every 6 (six) hours as needed for wheezing or shortness of breath. 36 g 2  . amLODipine (NORVASC) 10 MG tablet Take 1 tablet  (10 mg total) by mouth daily. 90 tablet 3  . atorvastatin (LIPITOR) 20 MG tablet Take 1 tablet (20 mg total) by mouth daily. 90 tablet 3  . benzonatate (TESSALON) 100 MG capsule TAKE 1 CAPSULE EVERY 8 HOURS AS NEEDED FOR COUGH (Patient taking differently: Take 100 mg by mouth 3 (three) times daily as needed for cough.) 90 capsule 0  . chlorthalidone (HYGROTON) 50 MG tablet TAKE 1 TABLET EVERY DAY (Patient taking differently: Take 50 mg by mouth daily.) 90 tablet 3  . dexamethasone (DECADRON) 4 MG tablet Please take TWO tablets TWICE a day the day before, the day of, and the day after chemotherapy (Patient taking differently: Take 8 mg by mouth See admin instructions. Please take TWO tablets TWICE a day the day before, the day of, and the day after chemotherapy) 120 tablet 2  . famotidine (PEPCID) 20 MG tablet Take 20 mg by mouth daily.     Marland Kitchen FLUoxetine (PROZAC) 20 MG capsule TAKE 1 CAPSULE EVERY DAY (Patient taking differently: Take 20 mg by mouth daily.) 90 capsule 3  . HYDROcodone-acetaminophen (NORCO) 7.5-325 MG tablet Take 1 tablet by mouth every 6 (six) hours as needed for moderate pain. 60 tablet 0  . lidocaine-prilocaine (EMLA) cream Apply 1 application topically as needed. (Patient taking differently: Apply 1 application topically as needed (for chemotherapy).) 30 g 0  . magnesium oxide (MAG-OX) 400 MG tablet Take 1 tablet (400 mg total) by mouth daily. 30 tablet 1  . polyvinyl alcohol (LIQUIFILM TEARS) 1.4 % ophthalmic solution Place 1 drop into both eyes as needed for dry eyes.    . potassium chloride SA (KLOR-CON M20) 20 MEQ tablet Take 1 tablet (20 mEq total) by mouth daily. 10 tablet 0  . tiotropium (SPIRIVA HANDIHALER) 18 MCG inhalation capsule Place 1 capsule (18 mcg total) into inhaler and inhale daily. 30 capsule 2  . fluticasone (FLONASE) 50 MCG/ACT nasal spray Place 1 spray into both nostrils daily. 11.1 mL 0   No current facility-administered medications for this visit.     REVIEW OF SYSTEMS:  [X]  denotes positive finding, [ ]  denotes negative finding Cardiac  Comments:  Chest pain or chest pressure:    Shortness of breath upon exertion:    Short of breath when lying flat:    Irregular heart rhythm:        Vascular    Pain in calf, thigh, or hip brought on by ambulation:    Pain in feet at night that wakes you up from your sleep:     Blood clot in your veins:    Leg swelling:  Pulmonary    Oxygen at home:    Productive cough:     Wheezing:         Neurologic    Sudden weakness in arms or legs:     Sudden numbness in arms or legs:     Sudden onset of difficulty speaking or slurred speech:    Temporary loss of vision in one eye:     Problems with dizziness:         Gastrointestinal    Blood in stool:     Vomited blood:         Genitourinary    Burning when urinating:     Blood in urine:        Psychiatric    Major depression:         Hematologic    Bleeding problems:    Problems with blood clotting too easily:        Skin    Rashes or ulcers:        Constitutional    Fever or chills:     PHYSICAL EXAM:   Vitals:   05/09/20 1102  BP: 122/78  Pulse: 81  Resp: 20  Temp: 98.1 F (36.7 C)  SpO2: 99%  Weight: 134 lb (60.8 kg)  Height: 5' 1"  (1.549 m)   Constitutional: chronically ill appearing in no distress. Appears adequately nourished.  Neurologic: CN intact. No focal findings. No sensory loss. Psychiatric: Mood and affect symmetric and appropriate. Eyes: No icterus. No conjunctival pallor. Ears, nose, throat: mucous membranes moist. Midline trachea.  Cardiac: regular rate and rhythm.  Respiratory: unlabored. Abdominal: soft, non-tender, non-distended.  Extremity: No edema. No cyanosis. No pallor.  Skin: No gangrene. No ulceration.  Lymphatic: No Stemmer's sign. No palpable lymphadenopathy.   DATA REVIEW:    Most recent CBC CBC Latest Ref Rng & Units 05/03/2020 04/26/2020 04/19/2020  WBC 4.0 - 10.5 K/uL 9.8  7.0 5.4  Hemoglobin 12.0 - 15.0 g/dL 9.5(L) 9.0(L) 9.5(L)  Hematocrit 36.0 - 46.0 % 29.3(L) 27.3(L) 29.1(L)  Platelets 150 - 400 K/uL 160 123(L) 332     Most recent CMP CMP Latest Ref Rng & Units 05/03/2020 04/26/2020 04/19/2020  Glucose 70 - 99 mg/dL 91 93 97  BUN 8 - 23 mg/dL 20 21 25(H)  Creatinine 0.44 - 1.00 mg/dL 0.84 0.83 0.93  Sodium 135 - 145 mmol/L 137 134(L) 139  Potassium 3.5 - 5.1 mmol/L 3.3(L) 3.0(L) 3.8  Chloride 98 - 111 mmol/L 101 97(L) 103  CO2 22 - 32 mmol/L 27 27 29   Calcium 8.9 - 10.3 mg/dL 9.5 9.3 9.2  Total Protein 6.5 - 8.1 g/dL 7.2 6.4(L) 6.7  Total Bilirubin 0.3 - 1.2 mg/dL 0.7 0.7 0.7  Alkaline Phos 38 - 126 U/L 83 58 49  AST 15 - 41 U/L 14(L) 17 14(L)  ALT 0 - 44 U/L 7 11 9     Renal function Estimated Creatinine Clearance: 49.9 mL/min (by C-G formula based on SCr of 0.84 mg/dL).  Hgb A1c MFr Bld (%)  Date Value  10/13/2017 5.9 (H)    LDL Calculated  Date Value Ref Range Status  10/13/2017 97 0 - 99 mg/dL Final    CLINICAL DATA:  Severe epigastric pain. History of metastatic lung cancer.  EXAM: CT ABDOMEN AND PELVIS WITH CONTRAST  TECHNIQUE: Multidetector CT imaging of the abdomen and pelvis was performed using the standard protocol following bolus administration of intravenous contrast.  CONTRAST:  160m OMNIPAQUE IOHEXOL 300 MG/ML  SOLN  COMPARISON:  CT abdomen pelvis dated March 15, 2020.  FINDINGS: Lower chest: No acute abnormality. Unchanged elevation of the left hemidiaphragm.  Hepatobiliary: Several small cysts in the liver are unchanged. Status post cholecystectomy. Unchanged mild intra and extrahepatic biliary dilatation, likely due to post cholecystectomy state.  Pancreas: Unremarkable. No pancreatic ductal dilatation or surrounding inflammatory changes.  Spleen: Normal in size without focal abnormality.  Adrenals/Urinary Tract: Adrenal glands are unremarkable. Kidneys are normal, without renal calculi, focal  lesion, or hydronephrosis. Bladder is unremarkable.  Stomach/Bowel: Stomach is within normal limits. Appendix is not visualized but there are no signs of inflammation at the base of the cecum. No evidence of bowel wall thickening, distention, or inflammatory changes.  Vascular/Lymphatic: Aortic atherosclerosis. Unchanged fusiform aneurysmal dilatation of the distal descending thoracic and abdominal aorta measuring up to 4.3 cm (series 2, image 23), similar to prior study by my measurements. No surrounding inflammatory changes. No enlarged abdominal or pelvic lymph nodes.  Reproductive: Status post hysterectomy. Unchanged 7.3 x 6.8 cm simple appearing cyst in the left ovary.  Other: No abdominal wall hernia or abnormality. No abdominopelvic ascites. No pneumoperitoneum.  Musculoskeletal: No acute or significant osseous findings.  IMPRESSION: 1. No acute intra-abdominal process. 2. 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 Recommend follow-up every 12 months and vascular consultation. This recommendation follows ACR consensus guidelines: White Paper of the ACR Incidental Findings Committee II on Vascular Findings. J Am Coll Radiol 2013; 10:789-794. 3. 7.3 cm simple appearing left ovarian cyst, unchanged since the prior study, but slowly increasing in size since 2018. Recommend follow-up US in 6-12 months. Note: This recommendation does not apply to premenarchal patients and to those with increased risk (genetic, family history, elevated tumor markers or other high-risk factors) of ovarian cancer. Reference: JACR 2020 Feb; 17(2):248-254 4. Aortic Atherosclerosis (ICD10-I70.0).   Yevonne Aline. Stanford Breed, MD [facility] Phone Number: [phone] 05/09/2020 11:26 AM

## 2020-05-10 ENCOUNTER — Inpatient Hospital Stay: Payer: Medicare HMO

## 2020-05-10 ENCOUNTER — Other Ambulatory Visit: Payer: Self-pay

## 2020-05-10 ENCOUNTER — Inpatient Hospital Stay (HOSPITAL_BASED_OUTPATIENT_CLINIC_OR_DEPARTMENT_OTHER): Payer: Medicare HMO | Admitting: Physician Assistant

## 2020-05-10 VITALS — BP 119/80 | HR 89 | Temp 97.6°F | Resp 14 | Ht 61.0 in | Wt 136.4 lb

## 2020-05-10 DIAGNOSIS — I700 Atherosclerosis of aorta: Secondary | ICD-10-CM | POA: Diagnosis not present

## 2020-05-10 DIAGNOSIS — G629 Polyneuropathy, unspecified: Secondary | ICD-10-CM | POA: Diagnosis not present

## 2020-05-10 DIAGNOSIS — R51 Headache with orthostatic component, not elsewhere classified: Secondary | ICD-10-CM | POA: Diagnosis not present

## 2020-05-10 DIAGNOSIS — C3491 Malignant neoplasm of unspecified part of right bronchus or lung: Secondary | ICD-10-CM

## 2020-05-10 DIAGNOSIS — G4730 Sleep apnea, unspecified: Secondary | ICD-10-CM | POA: Diagnosis not present

## 2020-05-10 DIAGNOSIS — Z79899 Other long term (current) drug therapy: Secondary | ICD-10-CM | POA: Diagnosis not present

## 2020-05-10 DIAGNOSIS — Z5111 Encounter for antineoplastic chemotherapy: Secondary | ICD-10-CM

## 2020-05-10 DIAGNOSIS — I714 Abdominal aortic aneurysm, without rupture: Secondary | ICD-10-CM | POA: Diagnosis not present

## 2020-05-10 DIAGNOSIS — J439 Emphysema, unspecified: Secondary | ICD-10-CM | POA: Diagnosis not present

## 2020-05-10 DIAGNOSIS — F329 Major depressive disorder, single episode, unspecified: Secondary | ICD-10-CM | POA: Diagnosis not present

## 2020-05-10 DIAGNOSIS — D649 Anemia, unspecified: Secondary | ICD-10-CM

## 2020-05-10 DIAGNOSIS — G6289 Other specified polyneuropathies: Secondary | ICD-10-CM | POA: Diagnosis not present

## 2020-05-10 DIAGNOSIS — I8290 Acute embolism and thrombosis of unspecified vein: Secondary | ICD-10-CM

## 2020-05-10 DIAGNOSIS — I2510 Atherosclerotic heart disease of native coronary artery without angina pectoris: Secondary | ICD-10-CM | POA: Diagnosis not present

## 2020-05-10 DIAGNOSIS — J449 Chronic obstructive pulmonary disease, unspecified: Secondary | ICD-10-CM | POA: Diagnosis not present

## 2020-05-10 DIAGNOSIS — C3411 Malignant neoplasm of upper lobe, right bronchus or lung: Secondary | ICD-10-CM | POA: Diagnosis not present

## 2020-05-10 DIAGNOSIS — I10 Essential (primary) hypertension: Secondary | ICD-10-CM | POA: Diagnosis not present

## 2020-05-10 DIAGNOSIS — T451X5A Adverse effect of antineoplastic and immunosuppressive drugs, initial encounter: Secondary | ICD-10-CM

## 2020-05-10 DIAGNOSIS — F17210 Nicotine dependence, cigarettes, uncomplicated: Secondary | ICD-10-CM | POA: Diagnosis not present

## 2020-05-10 DIAGNOSIS — D6481 Anemia due to antineoplastic chemotherapy: Secondary | ICD-10-CM

## 2020-05-10 DIAGNOSIS — Z95828 Presence of other vascular implants and grafts: Secondary | ICD-10-CM

## 2020-05-10 LAB — CBC WITH DIFFERENTIAL (CANCER CENTER ONLY)
Abs Immature Granulocytes: 0.02 10*3/uL (ref 0.00–0.07)
Basophils Absolute: 0 10*3/uL (ref 0.0–0.1)
Basophils Relative: 0 %
Eosinophils Absolute: 0 10*3/uL (ref 0.0–0.5)
Eosinophils Relative: 0 %
HCT: 26 % — ABNORMAL LOW (ref 36.0–46.0)
Hemoglobin: 8.4 g/dL — ABNORMAL LOW (ref 12.0–15.0)
Immature Granulocytes: 0 %
Lymphocytes Relative: 14 %
Lymphs Abs: 1 10*3/uL (ref 0.7–4.0)
MCH: 32.9 pg (ref 26.0–34.0)
MCHC: 32.3 g/dL (ref 30.0–36.0)
MCV: 102 fL — ABNORMAL HIGH (ref 80.0–100.0)
Monocytes Absolute: 0.2 10*3/uL (ref 0.1–1.0)
Monocytes Relative: 2 %
Neutro Abs: 6.3 10*3/uL (ref 1.7–7.7)
Neutrophils Relative %: 84 %
Platelet Count: 346 10*3/uL (ref 150–400)
RBC: 2.55 MIL/uL — ABNORMAL LOW (ref 3.87–5.11)
RDW: 16 % — ABNORMAL HIGH (ref 11.5–15.5)
WBC Count: 7.6 10*3/uL (ref 4.0–10.5)
nRBC: 0 % (ref 0.0–0.2)

## 2020-05-10 LAB — CMP (CANCER CENTER ONLY)
ALT: 10 U/L (ref 0–44)
AST: 15 U/L (ref 15–41)
Albumin: 3.4 g/dL — ABNORMAL LOW (ref 3.5–5.0)
Alkaline Phosphatase: 61 U/L (ref 38–126)
Anion gap: 10 (ref 5–15)
BUN: 17 mg/dL (ref 8–23)
CO2: 25 mmol/L (ref 22–32)
Calcium: 9.3 mg/dL (ref 8.9–10.3)
Chloride: 101 mmol/L (ref 98–111)
Creatinine: 1 mg/dL (ref 0.44–1.00)
GFR, Estimated: 59 mL/min — ABNORMAL LOW (ref >60)
Glucose, Bld: 112 mg/dL — ABNORMAL HIGH (ref 70–99)
Potassium: 3.6 mmol/L (ref 3.5–5.1)
Sodium: 136 mmol/L (ref 135–145)
Total Bilirubin: 0.5 mg/dL (ref 0.3–1.2)
Total Protein: 7.2 g/dL (ref 6.5–8.1)

## 2020-05-10 MED ORDER — RAMUCIRUMAB CHEMO INJECTION 100 MG/10ML
10.0000 mg/kg | Freq: Once | INTRAVENOUS | Status: AC
  Administered 2020-05-10: 600 mg via INTRAVENOUS
  Filled 2020-05-10: qty 50

## 2020-05-10 MED ORDER — SODIUM CHLORIDE 0.9% FLUSH
10.0000 mL | INTRAVENOUS | Status: DC | PRN
  Administered 2020-05-10: 10 mL
  Filled 2020-05-10: qty 10

## 2020-05-10 MED ORDER — SODIUM CHLORIDE 0.9 % IV SOLN
75.0000 mg/m2 | Freq: Once | INTRAVENOUS | Status: AC
  Administered 2020-05-10: 120 mg via INTRAVENOUS
  Filled 2020-05-10: qty 12

## 2020-05-10 MED ORDER — DIPHENHYDRAMINE HCL 50 MG/ML IJ SOLN
INTRAMUSCULAR | Status: AC
  Filled 2020-05-10: qty 1

## 2020-05-10 MED ORDER — DIPHENHYDRAMINE HCL 50 MG/ML IJ SOLN
50.0000 mg | Freq: Once | INTRAMUSCULAR | Status: AC
  Administered 2020-05-10: 50 mg via INTRAVENOUS

## 2020-05-10 MED ORDER — ACETAMINOPHEN 325 MG PO TABS
ORAL_TABLET | ORAL | Status: AC
  Filled 2020-05-10: qty 2

## 2020-05-10 MED ORDER — GABAPENTIN 100 MG PO CAPS: 100.0000 mg | ORAL_CAPSULE | Freq: Two times a day (BID) | ORAL | 2 refills | Status: DC

## 2020-05-10 MED ORDER — SODIUM CHLORIDE 0.9 % IV SOLN
Freq: Once | INTRAVENOUS | Status: AC
Start: 2020-05-10 — End: 2020-05-10
  Filled 2020-05-10: qty 250

## 2020-05-10 MED ORDER — DEXAMETHASONE SODIUM PHOSPHATE 100 MG/10ML IJ SOLN
10.0000 mg | Freq: Once | INTRAMUSCULAR | Status: AC
  Administered 2020-05-10: 10 mg via INTRAVENOUS
  Filled 2020-05-10: qty 10

## 2020-05-10 MED ORDER — ACETAMINOPHEN 325 MG PO TABS
650.0000 mg | ORAL_TABLET | Freq: Once | ORAL | Status: AC
  Administered 2020-05-10: 650 mg via ORAL

## 2020-05-10 MED ORDER — ALTEPLASE 2 MG IJ SOLR
2.0000 mg | Freq: Once | INTRAMUSCULAR | Status: DC | PRN
  Filled 2020-05-10: qty 2

## 2020-05-10 MED ORDER — HEPARIN SOD (PORK) LOCK FLUSH 100 UNIT/ML IV SOLN
500.0000 [IU] | Freq: Once | INTRAVENOUS | Status: AC | PRN
  Administered 2020-05-10: 500 [IU]
  Filled 2020-05-10: qty 5

## 2020-05-10 NOTE — Patient Instructions (Signed)
Implanted [facility] Guide An implanted port is a device that is placed under the skin. It is usually placed in the chest. The device can be used to give IV medicine, to take blood, or for dialysis. You may have an implanted port if:  You need IV medicine that would be irritating to the small veins in your hands or arms.  You need IV medicines, such as antibiotics, for a long period of time.  You need IV nutrition for a long period of time.  You need dialysis. When you have a port, your health care provider can choose to use the port instead of veins in your arms for these procedures. You may have fewer limitations when using a port than you would if you used other types of long-term IVs, and you will likely be able to return to normal activities after your incision heals. An implanted port has two main parts:  Reservoir. The reservoir is the part where a needle is inserted to give medicines or draw blood. The reservoir is round. After it is placed, it appears as a small, raised area under your skin.  Catheter. The catheter is a thin, flexible tube that connects the reservoir to a vein. Medicine that is inserted into the reservoir goes into the catheter and then into the vein. How is my port accessed? To access your port:  A numbing cream may be placed on the skin over the port site.  Your health care provider will put on a mask and sterile gloves.  The skin over your port will be cleaned carefully with a germ-killing soap and allowed to dry.  Your health care provider will gently pinch the port and insert a needle into it.  Your health care provider will check for a blood return to make sure the port is in the vein and is not clogged.  If your port needs to remain accessed to get medicine continuously (constant infusion), your health care provider will place a clear bandage (dressing) over the needle site. The dressing and needle will need to be changed every week, or as told by your  health care provider. What is flushing? Flushing helps keep the port from getting clogged. Follow instructions from your health care provider about how and when to flush the port. Ports are usually flushed with saline solution or a medicine called heparin. The need for flushing will depend on how the port is used:  If the port is only used from time to time to give medicines or draw blood, the port may need to be flushed: ? Before and after medicines have been given. ? Before and after blood has been drawn. ? As part of routine maintenance. Flushing may be recommended every 4-6 weeks.  If a constant infusion is running, the port may not need to be flushed.  Throw away any syringes in a disposal container that is meant for sharp items (sharps container). You can buy a sharps container from a pharmacy, or you can make one by using an empty hard plastic bottle with a cover. How long will my port stay implanted? The port can stay in for as long as your health care provider thinks it is needed. When it is time for the port to come out, a surgery will be done to remove it. The surgery will be similar to the procedure that was done to put the port in. Follow these instructions at home:  Flush your port as told by your health care  provider.  If you need an infusion over several days, follow instructions from your health care provider about how to take care of your port site. Make sure you: ? Wash your hands with soap and water before you change your dressing. If soap and water are not available, use alcohol-based hand sanitizer. ? Change your dressing as told by your health care provider. ? Place any used dressings or infusion bags into a plastic bag. Throw that bag in the trash. ? Keep the dressing that covers the needle clean and dry. Do not get it wet. ? Do not use scissors or sharp objects near the tube. ? Keep the tube clamped, unless it is being used.  Check your port site every day for signs  of infection. Check for: ? Redness, swelling, or pain. ? Fluid or blood. ? Pus or a bad smell.  Protect the skin around the port site. ? Avoid wearing bra straps that rub or irritate the site. ? Protect the skin around your port from seat belts. Place a soft pad over your chest if needed.  Bathe or shower as told by your health care provider. The site may get wet as long as you are not actively receiving an infusion.  Return to your normal activities as told by your health care provider. Ask your health care provider what activities are safe for you.  Carry a medical alert card or wear a medical alert bracelet at all times. This will let health care providers know that you have an implanted port in case of an emergency.   Get help right away if:  You have redness, swelling, or pain at the port site.  You have fluid or blood coming from your port site.  You have pus or a bad smell coming from the port site.  You have a fever. Summary  Implanted ports are usually placed in the chest for long-term IV access.  Follow instructions from your health care provider about flushing the port and changing bandages (dressings).  Take care of the area around your port by avoiding clothing that puts pressure on the area, and by watching for signs of infection.  Protect the skin around your port from seat belts. Place a soft pad over your chest if needed.  Get help right away if you have a fever or you have redness, swelling, pain, drainage, or a bad smell at the port site. This information is not intended to replace advice given to you by your health care provider. Make sure you discuss any questions you have with your health care provider. Document Revised: 07/26/2019 Document Reviewed: 07/26/2019 Elsevier Patient Education  [place].

## 2020-05-10 NOTE — Patient Instructions (Signed)
Debra Rodgers Discharge Instructions for Patients Receiving Chemotherapy  Today you received the following chemotherapy agents: Ramucirumab/Docetaxel.  To help prevent nausea and vomiting after your treatment, we encourage you to take your nausea medication as directed.   If you develop nausea and vomiting that is not controlled by your nausea medication, call the clinic.   BELOW ARE SYMPTOMS THAT SHOULD BE REPORTED IMMEDIATELY:  *FEVER GREATER THAN 100.5 F  *CHILLS WITH OR WITHOUT FEVER  NAUSEA AND VOMITING THAT IS NOT CONTROLLED WITH YOUR NAUSEA MEDICATION  *UNUSUAL SHORTNESS OF BREATH  *UNUSUAL BRUISING OR BLEEDING  TENDERNESS IN MOUTH AND THROAT WITH OR WITHOUT PRESENCE OF ULCERS  *URINARY PROBLEMS  *BOWEL PROBLEMS  UNUSUAL RASH Items with * indicate a potential emergency and should be followed up as soon as possible.  Feel free to call the clinic should you have any questions or concerns. The clinic phone number is [phone].  Please show the [place] at check-in to the Emergency Department and triage nurse.

## 2020-05-11 ENCOUNTER — Telehealth: Payer: Self-pay | Admitting: Physician Assistant

## 2020-05-11 NOTE — Telephone Encounter (Signed)
Scheduled appointments per 2/16 los. Will have updated calendar printed for patient at next visit.

## 2020-05-12 ENCOUNTER — Other Ambulatory Visit: Payer: Self-pay

## 2020-05-12 ENCOUNTER — Inpatient Hospital Stay: Payer: Medicare HMO

## 2020-05-12 VITALS — BP 121/81 | HR 79 | Resp 18

## 2020-05-12 DIAGNOSIS — Z5111 Encounter for antineoplastic chemotherapy: Secondary | ICD-10-CM | POA: Diagnosis not present

## 2020-05-12 DIAGNOSIS — C3491 Malignant neoplasm of unspecified part of right bronchus or lung: Secondary | ICD-10-CM

## 2020-05-12 MED ORDER — PEGFILGRASTIM-JMDB 6 MG/0.6ML ~~LOC~~ SOSY
PREFILLED_SYRINGE | SUBCUTANEOUS | Status: AC
  Filled 2020-05-12: qty 0.6

## 2020-05-12 MED ORDER — PEGFILGRASTIM-JMDB 6 MG/0.6ML ~~LOC~~ SOSY
6.0000 mg | PREFILLED_SYRINGE | Freq: Once | SUBCUTANEOUS | Status: AC
  Administered 2020-05-12: 6 mg via SUBCUTANEOUS

## 2020-05-12 NOTE — Patient Instructions (Signed)
Pegfilgrastim injection What is this medicine? PEGFILGRASTIM (PEG fil gra stim) is a long-acting granulocyte colony-stimulating factor that stimulates the growth of neutrophils, a type of white blood cell important in the body's fight against infection. It is used to reduce the incidence of fever and infection in patients with certain types of cancer who are receiving chemotherapy that affects the bone marrow, and to increase survival after being exposed to high doses of radiation. This medicine may be used for other purposes; ask your health care provider or pharmacist if you have questions. COMMON BRAND NAME(S): Rexene Edison, Ziextenzo What should I tell my health care provider before I take this medicine? They need to know if you have any of these conditions:  kidney disease  latex allergy  ongoing radiation therapy  sickle cell disease  skin reactions to acrylic adhesives (On-Body Injector only)  an unusual or allergic reaction to pegfilgrastim, filgrastim, other medicines, foods, dyes, or preservatives  pregnant or trying to get pregnant  breast-feeding How should I use this medicine? This medicine is for injection under the skin. If you get this medicine at home, you will be taught how to prepare and give the pre-filled syringe or how to use the On-body Injector. Refer to the patient Instructions for Use for detailed instructions. Use exactly as directed. Tell your healthcare provider immediately if you suspect that the On-body Injector may not have performed as intended or if you suspect the use of the On-body Injector resulted in a missed or partial dose. It is important that you put your used needles and syringes in a special sharps container. Do not put them in a trash can. If you do not have a sharps container, call your pharmacist or healthcare provider to get one. Talk to your pediatrician regarding the use of this medicine in children. While this drug  may be prescribed for selected conditions, precautions do apply. Overdosage: If you think you have taken too much of this medicine contact a poison control center or emergency room at once. NOTE: This medicine is only for you. Do not share this medicine with others. What if I miss a dose? It is important not to miss your dose. Call your doctor or health care professional if you miss your dose. If you miss a dose due to an On-body Injector failure or leakage, a new dose should be administered as soon as possible using a single prefilled syringe for manual use. What may interact with this medicine? Interactions have not been studied. This list may not describe all possible interactions. Give your health care provider a list of all the medicines, herbs, non-prescription drugs, or dietary supplements you use. Also tell them if you smoke, drink alcohol, or use illegal drugs. Some items may interact with your medicine. What should I watch for while using this medicine? Your condition will be monitored carefully while you are receiving this medicine. You may need blood work done while you are taking this medicine. Talk to your health care provider about your risk of cancer. You may be more at risk for certain types of cancer if you take this medicine. If you are going to need a MRI, CT scan, or other procedure, tell your doctor that you are using this medicine (On-Body Injector only). What side effects may I notice from receiving this medicine? Side effects that you should report to your doctor or health care professional as soon as possible:  allergic reactions (skin rash, itching or hives, swelling of  the face, lips, or tongue)  back pain  dizziness  fever  pain, redness, or irritation at site where injected  pinpoint red spots on the skin  red or dark-brown urine  shortness of breath or breathing problems  stomach or side pain, or pain at the shoulder  swelling  tiredness  trouble  passing urine or change in the amount of urine  unusual bruising or bleeding Side effects that usually do not require medical attention (report to your doctor or health care professional if they continue or are bothersome):  bone pain  muscle pain This list may not describe all possible side effects. Call your doctor for medical advice about side effects. You may report side effects to FDA at 1-800-FDA-1088. Where should I keep my medicine? Keep out of the reach of children. If you are using this medicine at home, you will be instructed on how to store it. Throw away any unused medicine after the expiration date on the label. NOTE: This sheet is a summary. It may not cover all possible information. If you have questions about this medicine, talk to your doctor, pharmacist, or health care provider.  2021 Elsevier/Gold Standard (2019-04-02 13:20:51)

## 2020-05-17 ENCOUNTER — Inpatient Hospital Stay: Payer: Medicare HMO

## 2020-05-17 ENCOUNTER — Other Ambulatory Visit: Payer: Self-pay | Admitting: Physician Assistant

## 2020-05-17 ENCOUNTER — Other Ambulatory Visit: Payer: Self-pay

## 2020-05-17 DIAGNOSIS — C3491 Malignant neoplasm of unspecified part of right bronchus or lung: Secondary | ICD-10-CM

## 2020-05-17 DIAGNOSIS — E876 Hypokalemia: Secondary | ICD-10-CM

## 2020-05-17 DIAGNOSIS — Z5111 Encounter for antineoplastic chemotherapy: Secondary | ICD-10-CM | POA: Diagnosis not present

## 2020-05-17 DIAGNOSIS — D649 Anemia, unspecified: Secondary | ICD-10-CM

## 2020-05-17 DIAGNOSIS — Z95828 Presence of other vascular implants and grafts: Secondary | ICD-10-CM

## 2020-05-17 LAB — CBC WITH DIFFERENTIAL (CANCER CENTER ONLY)
Abs Immature Granulocytes: 0.21 10*3/uL — ABNORMAL HIGH (ref 0.00–0.07)
Basophils Absolute: 0.1 10*3/uL (ref 0.0–0.1)
Basophils Relative: 1 %
Eosinophils Absolute: 0.1 10*3/uL (ref 0.0–0.5)
Eosinophils Relative: 1 %
HCT: 26.7 % — ABNORMAL LOW (ref 36.0–46.0)
Hemoglobin: 8.6 g/dL — ABNORMAL LOW (ref 12.0–15.0)
Immature Granulocytes: 4 %
Lymphocytes Relative: 35 %
Lymphs Abs: 2 10*3/uL (ref 0.7–4.0)
MCH: 33.1 pg (ref 26.0–34.0)
MCHC: 32.2 g/dL (ref 30.0–36.0)
MCV: 102.7 fL — ABNORMAL HIGH (ref 80.0–100.0)
Monocytes Absolute: 0.5 10*3/uL (ref 0.1–1.0)
Monocytes Relative: 9 %
Neutro Abs: 2.7 10*3/uL (ref 1.7–7.7)
Neutrophils Relative %: 50 %
Platelet Count: 186 10*3/uL (ref 150–400)
RBC: 2.6 MIL/uL — ABNORMAL LOW (ref 3.87–5.11)
RDW: 15.9 % — ABNORMAL HIGH (ref 11.5–15.5)
WBC Count: 5.6 10*3/uL (ref 4.0–10.5)
nRBC: 0.7 % — ABNORMAL HIGH (ref 0.0–0.2)

## 2020-05-17 LAB — CMP (CANCER CENTER ONLY)
ALT: 11 U/L (ref 0–44)
AST: 12 U/L — ABNORMAL LOW (ref 15–41)
Albumin: 3.2 g/dL — ABNORMAL LOW (ref 3.5–5.0)
Alkaline Phosphatase: 67 U/L (ref 38–126)
Anion gap: 8 (ref 5–15)
BUN: 26 mg/dL — ABNORMAL HIGH (ref 8–23)
CO2: 29 mmol/L (ref 22–32)
Calcium: 9.1 mg/dL (ref 8.9–10.3)
Chloride: 101 mmol/L (ref 98–111)
Creatinine: 0.85 mg/dL (ref 0.44–1.00)
GFR, Estimated: >60 mL/min (ref >60)
Glucose, Bld: 101 mg/dL — ABNORMAL HIGH (ref 70–99)
Potassium: 3 mmol/L — ABNORMAL LOW (ref 3.5–5.1)
Sodium: 138 mmol/L (ref 135–145)
Total Bilirubin: 0.7 mg/dL (ref 0.3–1.2)
Total Protein: 6.2 g/dL — ABNORMAL LOW (ref 6.5–8.1)

## 2020-05-17 LAB — SAMPLE TO BLOOD BANK

## 2020-05-17 MED ORDER — POTASSIUM CHLORIDE CRYS ER 20 MEQ PO TBCR: 20.0000 meq | EXTENDED_RELEASE_TABLET | Freq: Every day | ORAL | 0 refills | Status: DC

## 2020-05-17 MED ORDER — HEPARIN SOD (PORK) LOCK FLUSH 100 UNIT/ML IV SOLN
500.0000 [IU] | Freq: Once | INTRAVENOUS | Status: AC | PRN
Start: 2020-05-17 — End: 2020-05-17
  Administered 2020-05-17: 500 [IU]
  Filled 2020-05-17: qty 5

## 2020-05-17 MED ORDER — SODIUM CHLORIDE 0.9% FLUSH
10.0000 mL | INTRAVENOUS | Status: DC | PRN
  Administered 2020-05-17: 10 mL
  Filled 2020-05-17: qty 10

## 2020-05-17 NOTE — Progress Notes (Signed)
Called to let her know I sent in a prescription for potassium supplements. Also called to let her know she does not need a blood transfusion at this time.

## 2020-05-17 NOTE — Patient Instructions (Signed)
Implanted [facility] Guide An implanted port is a device that is placed under the skin. It is usually placed in the chest. The device can be used to give IV medicine, to take blood, or for dialysis. You may have an implanted port if:  You need IV medicine that would be irritating to the small veins in your hands or arms.  You need IV medicines, such as antibiotics, for a long period of time.  You need IV nutrition for a long period of time.  You need dialysis. When you have a port, your health care provider can choose to use the port instead of veins in your arms for these procedures. You may have fewer limitations when using a port than you would if you used other types of long-term IVs, and you will likely be able to return to normal activities after your incision heals. An implanted port has two main parts:  Reservoir. The reservoir is the part where a needle is inserted to give medicines or draw blood. The reservoir is round. After it is placed, it appears as a small, raised area under your skin.  Catheter. The catheter is a thin, flexible tube that connects the reservoir to a vein. Medicine that is inserted into the reservoir goes into the catheter and then into the vein. How is my port accessed? To access your port:  A numbing cream may be placed on the skin over the port site.  Your health care provider will put on a mask and sterile gloves.  The skin over your port will be cleaned carefully with a germ-killing soap and allowed to dry.  Your health care provider will gently pinch the port and insert a needle into it.  Your health care provider will check for a blood return to make sure the port is in the vein and is not clogged.  If your port needs to remain accessed to get medicine continuously (constant infusion), your health care provider will place a clear bandage (dressing) over the needle site. The dressing and needle will need to be changed every week, or as told by your  health care provider. What is flushing? Flushing helps keep the port from getting clogged. Follow instructions from your health care provider about how and when to flush the port. Ports are usually flushed with saline solution or a medicine called heparin. The need for flushing will depend on how the port is used:  If the port is only used from time to time to give medicines or draw blood, the port may need to be flushed: ? Before and after medicines have been given. ? Before and after blood has been drawn. ? As part of routine maintenance. Flushing may be recommended every 4-6 weeks.  If a constant infusion is running, the port may not need to be flushed.  Throw away any syringes in a disposal container that is meant for sharp items (sharps container). You can buy a sharps container from a pharmacy, or you can make one by using an empty hard plastic bottle with a cover. How long will my port stay implanted? The port can stay in for as long as your health care provider thinks it is needed. When it is time for the port to come out, a surgery will be done to remove it. The surgery will be similar to the procedure that was done to put the port in. Follow these instructions at home:  Flush your port as told by your health care  provider.  If you need an infusion over several days, follow instructions from your health care provider about how to take care of your port site. Make sure you: ? Wash your hands with soap and water before you change your dressing. If soap and water are not available, use alcohol-based hand sanitizer. ? Change your dressing as told by your health care provider. ? Place any used dressings or infusion bags into a plastic bag. Throw that bag in the trash. ? Keep the dressing that covers the needle clean and dry. Do not get it wet. ? Do not use scissors or sharp objects near the tube. ? Keep the tube clamped, unless it is being used.  Check your port site every day for signs  of infection. Check for: ? Redness, swelling, or pain. ? Fluid or blood. ? Pus or a bad smell.  Protect the skin around the port site. ? Avoid wearing bra straps that rub or irritate the site. ? Protect the skin around your port from seat belts. Place a soft pad over your chest if needed.  Bathe or shower as told by your health care provider. The site may get wet as long as you are not actively receiving an infusion.  Return to your normal activities as told by your health care provider. Ask your health care provider what activities are safe for you.  Carry a medical alert card or wear a medical alert bracelet at all times. This will let health care providers know that you have an implanted port in case of an emergency.   Get help right away if:  You have redness, swelling, or pain at the port site.  You have fluid or blood coming from your port site.  You have pus or a bad smell coming from the port site.  You have a fever. Summary  Implanted ports are usually placed in the chest for long-term IV access.  Follow instructions from your health care provider about flushing the port and changing bandages (dressings).  Take care of the area around your port by avoiding clothing that puts pressure on the area, and by watching for signs of infection.  Protect the skin around your port from seat belts. Place a soft pad over your chest if needed.  Get help right away if you have a fever or you have redness, swelling, pain, drainage, or a bad smell at the port site. This information is not intended to replace advice given to you by your health care provider. Make sure you discuss any questions you have with your health care provider. Document Revised: 07/26/2019 Document Reviewed: 07/26/2019 Elsevier Patient Education  [place].

## 2020-05-24 ENCOUNTER — Inpatient Hospital Stay: Payer: Medicare HMO

## 2020-05-24 ENCOUNTER — Inpatient Hospital Stay: Payer: Medicare HMO | Attending: Physician Assistant

## 2020-05-24 ENCOUNTER — Other Ambulatory Visit: Payer: Self-pay

## 2020-05-24 DIAGNOSIS — Z5111 Encounter for antineoplastic chemotherapy: Secondary | ICD-10-CM | POA: Insufficient documentation

## 2020-05-24 DIAGNOSIS — C778 Secondary and unspecified malignant neoplasm of lymph nodes of multiple regions: Secondary | ICD-10-CM | POA: Insufficient documentation

## 2020-05-24 DIAGNOSIS — I2510 Atherosclerotic heart disease of native coronary artery without angina pectoris: Secondary | ICD-10-CM | POA: Diagnosis not present

## 2020-05-24 DIAGNOSIS — G4730 Sleep apnea, unspecified: Secondary | ICD-10-CM | POA: Insufficient documentation

## 2020-05-24 DIAGNOSIS — F17210 Nicotine dependence, cigarettes, uncomplicated: Secondary | ICD-10-CM | POA: Diagnosis not present

## 2020-05-24 DIAGNOSIS — I10 Essential (primary) hypertension: Secondary | ICD-10-CM | POA: Insufficient documentation

## 2020-05-24 DIAGNOSIS — Z79899 Other long term (current) drug therapy: Secondary | ICD-10-CM | POA: Diagnosis not present

## 2020-05-24 DIAGNOSIS — J439 Emphysema, unspecified: Secondary | ICD-10-CM | POA: Insufficient documentation

## 2020-05-24 DIAGNOSIS — Z5189 Encounter for other specified aftercare: Secondary | ICD-10-CM | POA: Diagnosis not present

## 2020-05-24 DIAGNOSIS — C782 Secondary malignant neoplasm of pleura: Secondary | ICD-10-CM | POA: Diagnosis not present

## 2020-05-24 DIAGNOSIS — C3411 Malignant neoplasm of upper lobe, right bronchus or lung: Secondary | ICD-10-CM | POA: Insufficient documentation

## 2020-05-24 DIAGNOSIS — D649 Anemia, unspecified: Secondary | ICD-10-CM

## 2020-05-24 DIAGNOSIS — I700 Atherosclerosis of aorta: Secondary | ICD-10-CM | POA: Diagnosis not present

## 2020-05-24 DIAGNOSIS — C3491 Malignant neoplasm of unspecified part of right bronchus or lung: Secondary | ICD-10-CM

## 2020-05-24 DIAGNOSIS — I714 Abdominal aortic aneurysm, without rupture: Secondary | ICD-10-CM | POA: Diagnosis not present

## 2020-05-24 DIAGNOSIS — Z95828 Presence of other vascular implants and grafts: Secondary | ICD-10-CM

## 2020-05-24 LAB — CBC WITH DIFFERENTIAL (CANCER CENTER ONLY)
Abs Immature Granulocytes: 0.08 10*3/uL — ABNORMAL HIGH (ref 0.00–0.07)
Basophils Absolute: 0.1 10*3/uL (ref 0.0–0.1)
Basophils Relative: 1 %
Eosinophils Absolute: 0 10*3/uL (ref 0.0–0.5)
Eosinophils Relative: 0 %
HCT: 29.2 % — ABNORMAL LOW (ref 36.0–46.0)
Hemoglobin: 9.4 g/dL — ABNORMAL LOW (ref 12.0–15.0)
Immature Granulocytes: 1 %
Lymphocytes Relative: 26 %
Lymphs Abs: 1.9 10*3/uL (ref 0.7–4.0)
MCH: 33.2 pg (ref 26.0–34.0)
MCHC: 32.2 g/dL (ref 30.0–36.0)
MCV: 103.2 fL — ABNORMAL HIGH (ref 80.0–100.0)
Monocytes Absolute: 0.4 10*3/uL (ref 0.1–1.0)
Monocytes Relative: 5 %
Neutro Abs: 5 10*3/uL (ref 1.7–7.7)
Neutrophils Relative %: 67 %
Platelet Count: 170 10*3/uL (ref 150–400)
RBC: 2.83 MIL/uL — ABNORMAL LOW (ref 3.87–5.11)
RDW: 16.4 % — ABNORMAL HIGH (ref 11.5–15.5)
WBC Count: 7.5 10*3/uL (ref 4.0–10.5)
nRBC: 0.3 % — ABNORMAL HIGH (ref 0.0–0.2)

## 2020-05-24 LAB — CMP (CANCER CENTER ONLY)
ALT: 9 U/L (ref 0–44)
AST: 15 U/L (ref 15–41)
Albumin: 3.6 g/dL (ref 3.5–5.0)
Alkaline Phosphatase: 77 U/L (ref 38–126)
Anion gap: 8 (ref 5–15)
BUN: 19 mg/dL (ref 8–23)
CO2: 26 mmol/L (ref 22–32)
Calcium: 9.5 mg/dL (ref 8.9–10.3)
Chloride: 104 mmol/L (ref 98–111)
Creatinine: 0.97 mg/dL (ref 0.44–1.00)
GFR, Estimated: >60 mL/min (ref >60)
Glucose, Bld: 89 mg/dL (ref 70–99)
Potassium: 3.7 mmol/L (ref 3.5–5.1)
Sodium: 138 mmol/L (ref 135–145)
Total Bilirubin: 0.5 mg/dL (ref 0.3–1.2)
Total Protein: 6.9 g/dL (ref 6.5–8.1)

## 2020-05-24 LAB — TSH: TSH: 2.383 u[IU]/mL (ref 0.308–3.960)

## 2020-05-24 LAB — TOTAL PROTEIN, URINE DIPSTICK: Protein, ur: NEGATIVE mg/dL

## 2020-05-24 LAB — SAMPLE TO BLOOD BANK

## 2020-05-24 MED ORDER — SODIUM CHLORIDE 0.9% FLUSH
10.0000 mL | INTRAVENOUS | Status: DC | PRN
  Administered 2020-05-24: 10 mL
  Filled 2020-05-24: qty 10

## 2020-05-24 MED ORDER — HEPARIN SOD (PORK) LOCK FLUSH 100 UNIT/ML IV SOLN
500.0000 [IU] | Freq: Once | INTRAVENOUS | Status: AC | PRN
Start: 2020-05-24 — End: 2020-05-24
  Administered 2020-05-24: 500 [IU]
  Filled 2020-05-24: qty 5

## 2020-05-24 NOTE — Patient Instructions (Signed)
Implanted Port Insertion, Care After This sheet gives you information about how to care for yourself after your procedure. Your health care provider may also give you more specific instructions. If you have problems or questions, contact your health care provider. What can I expect after the procedure? After the procedure, it is common to have:  Discomfort at the port insertion site.  Bruising on the skin over the port. This should improve over 3-4 days. Follow these instructions at home: Port care  After your port is placed, you will get a manufacturer's information card. The card has information about your port. Keep this card with you at all times.  Take care of the port as told by your health care provider. Ask your health care provider if you or a family member can get training for taking care of the port at home. A home health care nurse may also take care of the port.  Make sure to remember what type of port you have. Incision care  Follow instructions from your health care provider about how to take care of your port insertion site. Make sure you: ? Wash your hands with soap and water before and after you change your bandage (dressing). If soap and water are not available, use hand sanitizer. ? Change your dressing as told by your health care provider. ? Leave stitches (sutures), skin glue, or adhesive strips in place. These skin closures may need to stay in place for 2 weeks or longer. If adhesive strip edges start to loosen and curl up, you may trim the loose edges. Do not remove adhesive strips completely unless your health care provider tells you to do that.  Check your port insertion site every day for signs of infection. Check for: ? Redness, swelling, or pain. ? Fluid or blood. ? Warmth. ? Pus or a bad smell.      Activity  Return to your normal activities as told by your health care provider. Ask your health care provider what activities are safe for you.  Do not  lift anything that is heavier than 10 lb (4.5 kg), or the limit that you are told, until your health care provider says that it is safe. General instructions  Take over-the-counter and prescription medicines only as told by your health care provider.  Do not take baths, swim, or use a hot tub until your health care provider approves. Ask your health care provider if you may take showers. You may only be allowed to take sponge baths.  Do not drive for 24 hours if you were given a sedative during your procedure.  Wear a medical alert bracelet in case of an emergency. This will tell any health care providers that you have a port.  Keep all follow-up visits as told by your health care provider. This is important. Contact a health care provider if:  You cannot flush your port with saline as directed, or you cannot draw blood from the port.  You have a fever or chills.  You have redness, swelling, or pain around your port insertion site.  You have fluid or blood coming from your port insertion site.  Your port insertion site feels warm to the touch.  You have pus or a bad smell coming from the port insertion site. Get help right away if:  You have chest pain or shortness of breath.  You have bleeding from your port that you cannot control. Summary  Take care of the port as told by your   health care provider. Keep the manufacturer's information card with you at all times.  Change your dressing as told by your health care provider.  Contact a health care provider if you have a fever or chills or if you have redness, swelling, or pain around your port insertion site.  Keep all follow-up visits as told by your health care provider. This information is not intended to replace advice given to you by your health care provider. Make sure you discuss any questions you have with your health care provider. Document Revised: 10/07/2017 Document Reviewed: 10/07/2017 Elsevier Patient Education   2021 Elsevier Inc.  

## 2020-05-26 NOTE — Progress Notes (Signed)
[place] OFFICE PROGRESS NOTE  [name], MD [address] [place] [address]  DIAGNOSIS: Stage IV non-small cell lung cancer, squamous cell carcinoma. She presented withright upper lobe lung mass in addition to pleural-based metastasis andmediastinal lymphadenopathy.She was diagnosed in September 2020.  PRIOR THERAPY: Chemotherapy with carboplatin for an AUC of 5,paclitaxel 175 mg/m, and Keytruda 200 mg IV every 3 weeks with Neulasta support.Last dose9/15/21.Status post18cycles. Starting from cycle #5wason maintenance treatment with single agent Keytruda every 3 weeks.This was discontinued due to evidence of disease progression.  CURRENT THERAPY: Palliative systemic chemotherapy with docetaxel 75 mg per metered squared and Cyramza 10 mg/kg IV every 3 weeks with Neulasta support. First dose on 01/04/20. Status post7cycles.  INTERVAL HISTORY: Debra Rodgers 75 y.o. female returns to the clinic today for a follow up visit. The patient is feeling fairly well today without any concerning complaints. She has been busy lately trying to help her older sister with dementia obtain help at home. She denies any fever or weight loss.  The patient reports that her appetite comes and goes and she drinks approximately 1 Ensure per day.  The patient reports her baseline intermittent night sweats.  She reports her baseline dyspnea on exertion and cough. If she has wheezing, she uses her inhaler with improvement in her symptoms.  She denies any hemoptysis.  She denies any nausea, vomiting, or constipation.  She experiences baseline intermittent diarrhea on and off for several years that "depends on what she eats".  She takes 100 mg tablet BID of gabapentin for her peripheral neuropathy. She denies any rashes or skin changes. She has been reporting headaches recently. She had an MRI of the brain performed a few months ago to evaluate her headaches. There was no  evidence of metastatic disease to the brain or a clear etiology of her headaches. She takes she will take tylenol. Her headaches are worse with certain positions. She states when she is sitting up it can achy in her neck to the base of the skull but her symptoms improve with laying down. She is planning on reaching out to her PCP soon to evaluate her headaches.   She is here today for evaluation before starting cycle #7 of her treatment.       MEDICAL HISTORY: Past Medical History:  Diagnosis Date  . AAA (abdominal aortic aneurysm) ([place])   . COPD (chronic obstructive pulmonary disease) ([place])   . Depression   . Essential hypertension   . Headache   . History of migraine headaches   . lung ca dx'd 10/2018  . Sleep apnea   . Tobacco use disorder     ALLERGIES:  is allergic to codeine sulfate and pantoprazole sodium.  MEDICATIONS:  Current Outpatient Medications  Medication Sig Dispense Refill  . acetaminophen (TYLENOL) 500 MG tablet Take 500 mg by mouth every 6 (six) hours as needed for moderate pain.    Marland Kitchen albuterol (VENTOLIN HFA) 108 (90 Base) MCG/ACT inhaler Inhale 2 puffs into the lungs every 6 (six) hours as needed for wheezing or shortness of breath. 36 g 2  . amLODipine (NORVASC) 10 MG tablet Take 1 tablet (10 mg total) by mouth daily. 90 tablet 3  . atorvastatin (LIPITOR) 20 MG tablet Take 1 tablet (20 mg total) by mouth daily. 90 tablet 3  . benzonatate (TESSALON) 100 MG capsule TAKE 1 CAPSULE EVERY 8 HOURS AS NEEDED FOR COUGH (Patient taking differently: Take 100 mg by mouth 3 (  three) times daily as needed for cough.) 90 capsule 0  . chlorthalidone (HYGROTON) 50 MG tablet TAKE 1 TABLET EVERY DAY (Patient taking differently: Take 50 mg by mouth daily.) 90 tablet 3  . dexamethasone (DECADRON) 4 MG tablet Please take TWO tablets TWICE a day the day before, the day of, and the day after chemotherapy (Patient taking differently: Take 8 mg by mouth See admin instructions. Please take  TWO tablets TWICE a day the day before, the day of, and the day after chemotherapy) 120 tablet 2  . famotidine (PEPCID) 20 MG tablet Take 20 mg by mouth daily.     Marland Kitchen FLUoxetine (PROZAC) 20 MG capsule TAKE 1 CAPSULE EVERY DAY (Patient taking differently: Take 20 mg by mouth daily.) 90 capsule 3  . fluticasone (FLONASE) 50 MCG/ACT nasal spray Place 1 spray into both nostrils daily. 11.1 mL 0  . gabapentin (NEURONTIN) 100 MG capsule Take 1 capsule (100 mg total) by mouth 2 (two) times daily. 90 capsule 2  . HYDROcodone-acetaminophen (NORCO) 7.5-325 MG tablet Take 1 tablet by mouth every 6 (six) hours as needed for moderate pain. 60 tablet 0  . lidocaine-prilocaine (EMLA) cream Apply 1 application topically as needed. (Patient taking differently: Apply 1 application topically as needed (for chemotherapy).) 30 g 0  . magnesium oxide (MAG-OX) 400 MG tablet Take 1 tablet (400 mg total) by mouth daily. 30 tablet 1  . polyvinyl alcohol (LIQUIFILM TEARS) 1.4 % ophthalmic solution Place 1 drop into both eyes as needed for dry eyes.    . potassium chloride SA (KLOR-CON M20) 20 MEQ tablet Take 1 tablet (20 mEq total) by mouth daily. 10 tablet 0  . tiotropium (SPIRIVA HANDIHALER) 18 MCG inhalation capsule Place 1 capsule (18 mcg total) into inhaler and inhale daily. 30 capsule 2   No current facility-administered medications for this visit.    SURGICAL HISTORY:  Past Surgical History:  Procedure Laterality Date  . ABDOMINAL HYSTERECTOMY    . CHOLECYSTECTOMY    . IR IMAGING GUIDED PORT INSERTION  02/24/2019  . ROTATOR CUFF REPAIR  3/04  . VIDEO BRONCHOSCOPY WITH ENDOBRONCHIAL ULTRASOUND Right 11/25/2018   Procedure: VIDEO BRONCHOSCOPY WITH ENDOBRONCHIAL ULTRASOUND;  Surgeon: Collene Gobble, MD;  Location: [facility] OR;  Service: Thoracic;  Laterality: Right;    REVIEW OF SYSTEMS:   Review of Systems  Constitutional:Positive for fatigue.Negative for weight loss and fever HENT: Negative for nosebleeds, sore  throat and trouble swallowing.  Eyes: Negative for eye problems and icterus.  Respiratory:Positive for mild cough and baseline dyspnea on exertion.Negative for hemoptysisand wheezing.  Cardiovascular: Positive for intermittent left lower rib pain (none at this time). Negative for leg swelling. Gastrointestinal:Positive forbaseline intermittentdiarrhea(controlled).Negative for abdominal pain, constipation, nausea and vomiting. Genitourinary: Negative for bladder incontinence, difficulty urinating, dysuria, frequency and hematuria.  Musculoskeletal: Negative for back pain, gait problem, neck pain and neck stiffness.  Skin: Negative for itching and rash.  Neurological: Positive for headaches. Negative for dizziness, extremity weakness, gait problem, light-headedness and seizures.  Hematological: Negative for adenopathy. Does not bruise/bleed easily.  Psychiatric/Behavioral: Negative for confusion, depression and sleep disturbance. The patient is not nervous/anxious.    PHYSICAL EXAMINATION:  Blood pressure 126/78, pulse 90, temperature 98 F (36.7 C), temperature source Tympanic, resp. rate 18, height 5' 1"  (1.549 m), weight 137 lb 6.4 oz (62.3 kg), SpO2 99 %.  ECOG PERFORMANCE STATUS: 1 - Symptomatic but completely ambulatory  Physical Exam  Constitutional: Oriented to person, place, and time and well-developed, well-nourished, and  in no distress.  HENT:  Head: Normocephalic and atraumatic.  Mouth/Throat: Oropharynx is clear and moist. No oropharyngeal exudate. Eyes: Conjunctivae are normal. Right eye exhibits no discharge. Left eye exhibits no discharge. No scleral icterus.  Neck: Normal range of motion. Neck supple.  Cardiovascular: Normal rate, regular rhythm, normal heart sounds and intact distal pulses.  Pulmonary/Chest: Effort normal and breath sounds normal. No respiratory distress. No wheezes. No rales.  Abdominal: Soft. Bowel sounds are normal. Exhibits no distension  and no mass. There is no tenderness.  Musculoskeletal: Tenderness to palpation on the lateral left chest wall.Normal range of motion. Exhibits no edema.  Lymphadenopathy:  No cervical adenopathy.  Neurological: Alert and oriented to person, place, and time. Exhibits normal muscle tone. Gait normal. Coordination normal.  Skin: Skin is warm and dry. No rash noted. Not diaphoretic. No erythema. No pallor.  Psychiatric: Mood, memory and judgment normal.  Vitals reviewed.  LABORATORY DATA: Lab Results  Component Value Date   WBC 8.5 05/31/2020   HGB 9.5 (L) 05/31/2020   HCT 28.6 (L) 05/31/2020   MCV 101.8 (H) 05/31/2020   PLT 259 05/31/2020      Chemistry      Component Value Date/Time   NA 137 05/31/2020 1016   NA 139 07/21/2017 0958   K 3.5 05/31/2020 1016   CL 101 05/31/2020 1016   CO2 26 05/31/2020 1016   BUN 28 (H) 05/31/2020 1016   BUN 26 07/21/2017 0958   CREATININE 0.97 05/31/2020 1016   CREATININE 0.79 09/03/2013 1540      Component Value Date/Time   CALCIUM 9.5 05/31/2020 1016   ALKPHOS 53 05/31/2020 1016   AST 11 (L) 05/31/2020 1016   ALT 7 05/31/2020 1016   BILITOT 0.5 05/31/2020 1016       RADIOGRAPHIC STUDIES:  CT Chest W Contrast  Result Date: 05/08/2020 CLINICAL DATA:  Non-small cell lung cancer staging in a 75 year old female. EXAM: CT CHEST WITH CONTRAST TECHNIQUE: Multidetector CT imaging of the chest was performed during intravenous contrast administration. CONTRAST:  59m OMNIPAQUE IOHEXOL 300 MG/ML  SOLN COMPARISON:  03/15/2020 FINDINGS: Cardiovascular: 4 cm ascending thoracic aorta. Normal heart size. 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 No pericardial effusion. Descending thoracic aorta is ectatic, dilated to approximately 3.6 cm above the aortic hiatus, unchanged. Central pulmonary vasculature with stable appearance maximal caliber approximately 3.3 cm. Mediastinum/Nodes: No adenopathy in the chest. Mild fullness of nodal  tissue along LEFT paratracheal chain ear AP window and RIGHT paratracheal chain approximately 8-9 mm similar to the prior study. No hilar adenopathy. Lungs/Pleura: Paraseptal and centrilobular emphysema, moderate to marked and worse at the lung apices with similar appearance. No consolidation. No pleural effusion. Basilar atelectasis. Airways are patent. Upper Abdomen: Incidental imaging of upper abdominal contents shows no acute process. Stable low-density lesion in the RIGHT hepatic lobe, likely a cyst. Imaged portions of pancreas, spleen, adrenal glands and kidneys is unremarkable. Aneurysmal dilation of the infrarenal abdominal aorta 3.9 x 3.4 cm with similar appearance with respect imaged portions compared to the prior study. Also dilation of the suprarenal abdominal aorta to approximately 4.1 x 3.6 cm also within 1 mm of previous size. Musculoskeletal: Spinal degenerative changes. No acute or destructive bone process. IMPRESSION: 1. No evidence of recurrent or metastatic disease in the chest. 2. Index nodules on the prior study are no longer seen as discrete nodules, for instance the area in the LEFT lower lobe superior segment is very subtle ground-glass measuring approximately  3 mm on image 41 of series 5. 3. 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 4. 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 5. Emphysema and aortic atherosclerosis. Aortic Atherosclerosis (ICD10-I70.0) and Emphysema (ICD10-J43.9). Electronically Signed   By: Zetta Bills M.D.   On: 05/08/2020 15:02     ASSESSMENT/PLAN:  This is a very pleasant 75 year old African-American female diagnosed with stage IV non-small cell lung cancer, squamous cell carcinoma. She presented with a right upper lobe lung mass and pleural based metastases and mediastinal lymphadenopathy.She was diagnosed in August 2020   The patientwas  onsystemicchemotherapy with carboplatin for an AUC of 5, paclitaxel 175 mg/m, and Keytruda 200 mg IV every 3 weeks with Neulasta support.Starting from cycle #5, shewas onmaintenance with single agent Keytruda.She is status post18cyclesof treatment and she tolerated it wellwithout any concerning adverse effectsexcept for mild fatigue.This was discontinued secondary to evidence of disease progression.  The patient is currently undergoing palliative systemic chemotherapy with docetaxel 75 mg per metered squared and Cyramza 10 mg/kg IV every 3 weeks with Neulasta support. She is status post7cycles and she is tolerating this well.  Labs were reviewed. Recommend that she proceed with cycle #8 today as scheduled.   We will see her back for a follow up visit in 3 weeks for evaluation before starting cycle #9  Discussed she can titrate up her gabapentin to 200 mg BID.   The patient was advised to call immediately if she has any concerning symptoms in the interval. The patient voices understanding of current disease status and treatment options and is in agreement with the current care plan. All questions were answered. The patient knows to call the clinic with any problems, questions or concerns. We can certainly see the patient much sooner if necessary        No orders of the defined types were placed in this encounter.    I spent 20-29 minutes in this encounter.   Yamira Papa L Ilya Ess, PA-C 05/31/20

## 2020-05-31 ENCOUNTER — Inpatient Hospital Stay: Payer: Medicare HMO

## 2020-05-31 ENCOUNTER — Other Ambulatory Visit: Payer: Self-pay

## 2020-05-31 ENCOUNTER — Encounter: Payer: Self-pay | Admitting: Physician Assistant

## 2020-05-31 ENCOUNTER — Other Ambulatory Visit: Payer: Self-pay | Admitting: Student in an Organized Health Care Education/Training Program

## 2020-05-31 ENCOUNTER — Inpatient Hospital Stay (HOSPITAL_BASED_OUTPATIENT_CLINIC_OR_DEPARTMENT_OTHER): Payer: Medicare HMO | Admitting: Physician Assistant

## 2020-05-31 VITALS — BP 126/78 | HR 90 | Temp 98.0°F | Resp 18 | Ht 61.0 in | Wt 137.4 lb

## 2020-05-31 VITALS — BP 105/60 | HR 70

## 2020-05-31 DIAGNOSIS — C3491 Malignant neoplasm of unspecified part of right bronchus or lung: Secondary | ICD-10-CM

## 2020-05-31 DIAGNOSIS — Z5111 Encounter for antineoplastic chemotherapy: Secondary | ICD-10-CM

## 2020-05-31 DIAGNOSIS — D649 Anemia, unspecified: Secondary | ICD-10-CM

## 2020-05-31 DIAGNOSIS — Z95828 Presence of other vascular implants and grafts: Secondary | ICD-10-CM

## 2020-05-31 LAB — CMP (CANCER CENTER ONLY)
ALT: 7 U/L (ref 0–44)
AST: 11 U/L — ABNORMAL LOW (ref 15–41)
Albumin: 3.8 g/dL (ref 3.5–5.0)
Alkaline Phosphatase: 53 U/L (ref 38–126)
Anion gap: 10 (ref 5–15)
BUN: 28 mg/dL — ABNORMAL HIGH (ref 8–23)
CO2: 26 mmol/L (ref 22–32)
Calcium: 9.5 mg/dL (ref 8.9–10.3)
Chloride: 101 mmol/L (ref 98–111)
Creatinine: 0.97 mg/dL (ref 0.44–1.00)
GFR, Estimated: >60 mL/min (ref >60)
Glucose, Bld: 111 mg/dL — ABNORMAL HIGH (ref 70–99)
Potassium: 3.5 mmol/L (ref 3.5–5.1)
Sodium: 137 mmol/L (ref 135–145)
Total Bilirubin: 0.5 mg/dL (ref 0.3–1.2)
Total Protein: 7 g/dL (ref 6.5–8.1)

## 2020-05-31 LAB — CBC WITH DIFFERENTIAL (CANCER CENTER ONLY)
Abs Immature Granulocytes: 0.04 10*3/uL (ref 0.00–0.07)
Basophils Absolute: 0 10*3/uL (ref 0.0–0.1)
Basophils Relative: 0 %
Eosinophils Absolute: 0 10*3/uL (ref 0.0–0.5)
Eosinophils Relative: 0 %
HCT: 28.6 % — ABNORMAL LOW (ref 36.0–46.0)
Hemoglobin: 9.5 g/dL — ABNORMAL LOW (ref 12.0–15.0)
Immature Granulocytes: 1 %
Lymphocytes Relative: 12 %
Lymphs Abs: 1 10*3/uL (ref 0.7–4.0)
MCH: 33.8 pg (ref 26.0–34.0)
MCHC: 33.2 g/dL (ref 30.0–36.0)
MCV: 101.8 fL — ABNORMAL HIGH (ref 80.0–100.0)
Monocytes Absolute: 0.3 10*3/uL (ref 0.1–1.0)
Monocytes Relative: 3 %
Neutro Abs: 7.2 10*3/uL (ref 1.7–7.7)
Neutrophils Relative %: 84 %
Platelet Count: 259 10*3/uL (ref 150–400)
RBC: 2.81 MIL/uL — ABNORMAL LOW (ref 3.87–5.11)
RDW: 16.4 % — ABNORMAL HIGH (ref 11.5–15.5)
WBC Count: 8.5 10*3/uL (ref 4.0–10.5)
nRBC: 0 % (ref 0.0–0.2)

## 2020-05-31 LAB — SAMPLE TO BLOOD BANK

## 2020-05-31 MED ORDER — DIPHENHYDRAMINE HCL 50 MG/ML IJ SOLN
INTRAMUSCULAR | Status: AC
  Filled 2020-05-31: qty 1

## 2020-05-31 MED ORDER — SODIUM CHLORIDE 0.9% FLUSH
10.0000 mL | INTRAVENOUS | Status: DC | PRN
  Administered 2020-05-31: 10 mL
  Filled 2020-05-31: qty 10

## 2020-05-31 MED ORDER — SODIUM CHLORIDE 0.9 % IV SOLN
75.0000 mg/m2 | Freq: Once | INTRAVENOUS | Status: AC
  Administered 2020-05-31: 120 mg via INTRAVENOUS
  Filled 2020-05-31: qty 12

## 2020-05-31 MED ORDER — HEPARIN SOD (PORK) LOCK FLUSH 100 UNIT/ML IV SOLN
500.0000 [IU] | Freq: Once | INTRAVENOUS | Status: AC | PRN
  Administered 2020-05-31: 500 [IU]
  Filled 2020-05-31: qty 5

## 2020-05-31 MED ORDER — SODIUM CHLORIDE 0.9 % IV SOLN
Freq: Once | INTRAVENOUS | Status: AC
  Filled 2020-05-31: qty 250

## 2020-05-31 MED ORDER — SODIUM CHLORIDE 0.9 % IV SOLN
10.0000 mg | Freq: Once | INTRAVENOUS | Status: AC
  Administered 2020-05-31: 10 mg via INTRAVENOUS
  Filled 2020-05-31: qty 10

## 2020-05-31 MED ORDER — SODIUM CHLORIDE 0.9 % IV SOLN
10.0000 mg/kg | Freq: Once | INTRAVENOUS | Status: AC
  Administered 2020-05-31: 600 mg via INTRAVENOUS
  Filled 2020-05-31: qty 50

## 2020-05-31 MED ORDER — ACETAMINOPHEN 325 MG PO TABS: 650.0000 mg | ORAL_TABLET | Freq: Once | ORAL | Status: DC

## 2020-05-31 MED ORDER — DIPHENHYDRAMINE HCL 50 MG/ML IJ SOLN
50.0000 mg | Freq: Once | INTRAMUSCULAR | Status: AC
  Administered 2020-05-31: 50 mg via INTRAVENOUS

## 2020-05-31 NOTE — Progress Notes (Signed)
Per C. Heilingoetter PA, OK to use urine protein from 05/24/20.

## 2020-05-31 NOTE — Patient Instructions (Signed)
[place] Discharge Instructions for Patients Receiving Chemotherapy  Today you received the following chemotherapy agents: Ramucirumab/Docetaxel.  To help prevent nausea and vomiting after your treatment, we encourage you to take your nausea medication as directed.   If you develop nausea and vomiting that is not controlled by your nausea medication, call the clinic.   BELOW ARE SYMPTOMS THAT SHOULD BE REPORTED IMMEDIATELY:  *FEVER GREATER THAN 100.5 F  *CHILLS WITH OR WITHOUT FEVER  NAUSEA AND VOMITING THAT IS NOT CONTROLLED WITH YOUR NAUSEA MEDICATION  *UNUSUAL SHORTNESS OF BREATH  *UNUSUAL BRUISING OR BLEEDING  TENDERNESS IN MOUTH AND THROAT WITH OR WITHOUT PRESENCE OF ULCERS  *URINARY PROBLEMS  *BOWEL PROBLEMS  UNUSUAL RASH Items with * indicate a potential emergency and should be followed up as soon as possible.  Feel free to call the clinic should you have any questions or concerns. The clinic phone number is [phone].  Please show the [place] at check-in to the Emergency Department and triage nurse.

## 2020-05-31 NOTE — Patient Instructions (Signed)
Implanted [facility] Guide An implanted port is a device that is placed under the skin. It is usually placed in the chest. The device can be used to give IV medicine, to take blood, or for dialysis. You may have an implanted port if:  You need IV medicine that would be irritating to the small veins in your hands or arms.  You need IV medicines, such as antibiotics, for a long period of time.  You need IV nutrition for a long period of time.  You need dialysis. When you have a port, your health care provider can choose to use the port instead of veins in your arms for these procedures. You may have fewer limitations when using a port than you would if you used other types of long-term IVs, and you will likely be able to return to normal activities after your incision heals. An implanted port has two main parts:  Reservoir. The reservoir is the part where a needle is inserted to give medicines or draw blood. The reservoir is round. After it is placed, it appears as a small, raised area under your skin.  Catheter. The catheter is a thin, flexible tube that connects the reservoir to a vein. Medicine that is inserted into the reservoir goes into the catheter and then into the vein. How is my port accessed? To access your port:  A numbing cream may be placed on the skin over the port site.  Your health care provider will put on a mask and sterile gloves.  The skin over your port will be cleaned carefully with a germ-killing soap and allowed to dry.  Your health care provider will gently pinch the port and insert a needle into it.  Your health care provider will check for a blood return to make sure the port is in the vein and is not clogged.  If your port needs to remain accessed to get medicine continuously (constant infusion), your health care provider will place a clear bandage (dressing) over the needle site. The dressing and needle will need to be changed every week, or as told by your  health care provider. What is flushing? Flushing helps keep the port from getting clogged. Follow instructions from your health care provider about how and when to flush the port. Ports are usually flushed with saline solution or a medicine called heparin. The need for flushing will depend on how the port is used:  If the port is only used from time to time to give medicines or draw blood, the port may need to be flushed: ? Before and after medicines have been given. ? Before and after blood has been drawn. ? As part of routine maintenance. Flushing may be recommended every 4-6 weeks.  If a constant infusion is running, the port may not need to be flushed.  Throw away any syringes in a disposal container that is meant for sharp items (sharps container). You can buy a sharps container from a pharmacy, or you can make one by using an empty hard plastic bottle with a cover. How long will my port stay implanted? The port can stay in for as long as your health care provider thinks it is needed. When it is time for the port to come out, a surgery will be done to remove it. The surgery will be similar to the procedure that was done to put the port in. Follow these instructions at home:  Flush your port as told by your health care  provider.  If you need an infusion over several days, follow instructions from your health care provider about how to take care of your port site. Make sure you: ? Wash your hands with soap and water before you change your dressing. If soap and water are not available, use alcohol-based hand sanitizer. ? Change your dressing as told by your health care provider. ? Place any used dressings or infusion bags into a plastic bag. Throw that bag in the trash. ? Keep the dressing that covers the needle clean and dry. Do not get it wet. ? Do not use scissors or sharp objects near the tube. ? Keep the tube clamped, unless it is being used.  Check your port site every day for signs  of infection. Check for: ? Redness, swelling, or pain. ? Fluid or blood. ? Pus or a bad smell.  Protect the skin around the port site. ? Avoid wearing bra straps that rub or irritate the site. ? Protect the skin around your port from seat belts. Place a soft pad over your chest if needed.  Bathe or shower as told by your health care provider. The site may get wet as long as you are not actively receiving an infusion.  Return to your normal activities as told by your health care provider. Ask your health care provider what activities are safe for you.  Carry a medical alert card or wear a medical alert bracelet at all times. This will let health care providers know that you have an implanted port in case of an emergency.   Get help right away if:  You have redness, swelling, or pain at the port site.  You have fluid or blood coming from your port site.  You have pus or a bad smell coming from the port site.  You have a fever. Summary  Implanted ports are usually placed in the chest for long-term IV access.  Follow instructions from your health care provider about flushing the port and changing bandages (dressings).  Take care of the area around your port by avoiding clothing that puts pressure on the area, and by watching for signs of infection.  Protect the skin around your port from seat belts. Place a soft pad over your chest if needed.  Get help right away if you have a fever or you have redness, swelling, pain, drainage, or a bad smell at the port site. This information is not intended to replace advice given to you by your health care provider. Make sure you discuss any questions you have with your health care provider. Document Revised: 07/26/2019 Document Reviewed: 07/26/2019 Elsevier Patient Education  [place].

## 2020-06-02 ENCOUNTER — Inpatient Hospital Stay: Payer: Medicare HMO

## 2020-06-02 ENCOUNTER — Other Ambulatory Visit: Payer: Self-pay

## 2020-06-02 VITALS — BP 117/95 | HR 74 | Resp 16

## 2020-06-02 DIAGNOSIS — Z95828 Presence of other vascular implants and grafts: Secondary | ICD-10-CM

## 2020-06-02 DIAGNOSIS — Z5111 Encounter for antineoplastic chemotherapy: Secondary | ICD-10-CM | POA: Diagnosis not present

## 2020-06-02 DIAGNOSIS — C3491 Malignant neoplasm of unspecified part of right bronchus or lung: Secondary | ICD-10-CM

## 2020-06-02 MED ORDER — PEGFILGRASTIM-JMDB 6 MG/0.6ML ~~LOC~~ SOSY
6.0000 mg | PREFILLED_SYRINGE | Freq: Once | SUBCUTANEOUS | Status: AC
  Administered 2020-06-02: 6 mg via SUBCUTANEOUS

## 2020-06-02 MED ORDER — PEGFILGRASTIM-JMDB 6 MG/0.6ML ~~LOC~~ SOSY
PREFILLED_SYRINGE | SUBCUTANEOUS | Status: AC
  Filled 2020-06-02: qty 0.6

## 2020-06-02 MED ORDER — SODIUM CHLORIDE 0.9% FLUSH
10.0000 mL | INTRAVENOUS | Status: DC | PRN
Start: 2020-06-02 — End: 2020-06-02
  Filled 2020-06-02: qty 10

## 2020-06-02 NOTE — Patient Instructions (Signed)
Pegfilgrastim injection What is this medicine? PEGFILGRASTIM (PEG fil gra stim) is a long-acting granulocyte colony-stimulating factor that stimulates the growth of neutrophils, a type of white blood cell important in the body's fight against infection. It is used to reduce the incidence of fever and infection in patients with certain types of cancer who are receiving chemotherapy that affects the bone marrow, and to increase survival after being exposed to high doses of radiation. This medicine may be used for other purposes; ask your health care provider or pharmacist if you have questions. COMMON BRAND NAME(S): Rexene Edison, Ziextenzo What should I tell my health care provider before I take this medicine? They need to know if you have any of these conditions:  kidney disease  latex allergy  ongoing radiation therapy  sickle cell disease  skin reactions to acrylic adhesives (On-Body Injector only)  an unusual or allergic reaction to pegfilgrastim, filgrastim, other medicines, foods, dyes, or preservatives  pregnant or trying to get pregnant  breast-feeding How should I use this medicine? This medicine is for injection under the skin. If you get this medicine at home, you will be taught how to prepare and give the pre-filled syringe or how to use the On-body Injector. Refer to the patient Instructions for Use for detailed instructions. Use exactly as directed. Tell your healthcare provider immediately if you suspect that the On-body Injector may not have performed as intended or if you suspect the use of the On-body Injector resulted in a missed or partial dose. It is important that you put your used needles and syringes in a special sharps container. Do not put them in a trash can. If you do not have a sharps container, call your pharmacist or healthcare provider to get one. Talk to your pediatrician regarding the use of this medicine in children. While this drug  may be prescribed for selected conditions, precautions do apply. Overdosage: If you think you have taken too much of this medicine contact a poison control center or emergency room at once. NOTE: This medicine is only for you. Do not share this medicine with others. What if I miss a dose? It is important not to miss your dose. Call your doctor or health care professional if you miss your dose. If you miss a dose due to an On-body Injector failure or leakage, a new dose should be administered as soon as possible using a single prefilled syringe for manual use. What may interact with this medicine? Interactions have not been studied. This list may not describe all possible interactions. Give your health care provider a list of all the medicines, herbs, non-prescription drugs, or dietary supplements you use. Also tell them if you smoke, drink alcohol, or use illegal drugs. Some items may interact with your medicine. What should I watch for while using this medicine? Your condition will be monitored carefully while you are receiving this medicine. You may need blood work done while you are taking this medicine. Talk to your health care provider about your risk of cancer. You may be more at risk for certain types of cancer if you take this medicine. If you are going to need a MRI, CT scan, or other procedure, tell your doctor that you are using this medicine (On-Body Injector only). What side effects may I notice from receiving this medicine? Side effects that you should report to your doctor or health care professional as soon as possible:  allergic reactions (skin rash, itching or hives, swelling of  the face, lips, or tongue)  back pain  dizziness  fever  pain, redness, or irritation at site where injected  pinpoint red spots on the skin  red or dark-brown urine  shortness of breath or breathing problems  stomach or side pain, or pain at the shoulder  swelling  tiredness  trouble  passing urine or change in the amount of urine  unusual bruising or bleeding Side effects that usually do not require medical attention (report to your doctor or health care professional if they continue or are bothersome):  bone pain  muscle pain This list may not describe all possible side effects. Call your doctor for medical advice about side effects. You may report side effects to FDA at 1-800-FDA-1088. Where should I keep my medicine? Keep out of the reach of children. If you are using this medicine at home, you will be instructed on how to store it. Throw away any unused medicine after the expiration date on the label. NOTE: This sheet is a summary. It may not cover all possible information. If you have questions about this medicine, talk to your doctor, pharmacist, or health care provider.  2021 Elsevier/Gold Standard (2019-04-02 13:20:51)

## 2020-06-07 ENCOUNTER — Inpatient Hospital Stay: Payer: Medicare HMO

## 2020-06-07 ENCOUNTER — Other Ambulatory Visit: Payer: Self-pay

## 2020-06-07 VITALS — BP 102/56 | HR 81 | Temp 98.2°F | Resp 18

## 2020-06-07 DIAGNOSIS — Z5111 Encounter for antineoplastic chemotherapy: Secondary | ICD-10-CM | POA: Diagnosis not present

## 2020-06-07 DIAGNOSIS — C3491 Malignant neoplasm of unspecified part of right bronchus or lung: Secondary | ICD-10-CM

## 2020-06-07 DIAGNOSIS — Z95828 Presence of other vascular implants and grafts: Secondary | ICD-10-CM

## 2020-06-07 LAB — CBC WITH DIFFERENTIAL (CANCER CENTER ONLY)
Abs Immature Granulocytes: 0.24 10*3/uL — ABNORMAL HIGH (ref 0.00–0.07)
Basophils Absolute: 0 10*3/uL (ref 0.0–0.1)
Basophils Relative: 1 %
Eosinophils Absolute: 0.1 10*3/uL (ref 0.0–0.5)
Eosinophils Relative: 1 %
HCT: 28.9 % — ABNORMAL LOW (ref 36.0–46.0)
Hemoglobin: 9.7 g/dL — ABNORMAL LOW (ref 12.0–15.0)
Immature Granulocytes: 5 %
Lymphocytes Relative: 33 %
Lymphs Abs: 1.5 10*3/uL (ref 0.7–4.0)
MCH: 33.8 pg (ref 26.0–34.0)
MCHC: 33.6 g/dL (ref 30.0–36.0)
MCV: 100.7 fL — ABNORMAL HIGH (ref 80.0–100.0)
Monocytes Absolute: 0.7 10*3/uL (ref 0.1–1.0)
Monocytes Relative: 16 %
Neutro Abs: 2 10*3/uL (ref 1.7–7.7)
Neutrophils Relative %: 44 %
Platelet Count: 111 10*3/uL — ABNORMAL LOW (ref 150–400)
RBC: 2.87 MIL/uL — ABNORMAL LOW (ref 3.87–5.11)
RDW: 16.4 % — ABNORMAL HIGH (ref 11.5–15.5)
WBC Count: 4.5 10*3/uL (ref 4.0–10.5)
nRBC: 0 % (ref 0.0–0.2)

## 2020-06-07 LAB — CMP (CANCER CENTER ONLY)
ALT: 9 U/L (ref 0–44)
AST: 11 U/L — ABNORMAL LOW (ref 15–41)
Albumin: 3.5 g/dL (ref 3.5–5.0)
Alkaline Phosphatase: 65 U/L (ref 38–126)
Anion gap: 7 (ref 5–15)
BUN: 23 mg/dL (ref 8–23)
CO2: 30 mmol/L (ref 22–32)
Calcium: 9.3 mg/dL (ref 8.9–10.3)
Chloride: 100 mmol/L (ref 98–111)
Creatinine: 0.82 mg/dL (ref 0.44–1.00)
GFR, Estimated: >60 mL/min (ref >60)
Glucose, Bld: 111 mg/dL — ABNORMAL HIGH (ref 70–99)
Potassium: 3.1 mmol/L — ABNORMAL LOW (ref 3.5–5.1)
Sodium: 137 mmol/L (ref 135–145)
Total Bilirubin: 0.9 mg/dL (ref 0.3–1.2)
Total Protein: 6.5 g/dL (ref 6.5–8.1)

## 2020-06-07 MED ORDER — HEPARIN SOD (PORK) LOCK FLUSH 100 UNIT/ML IV SOLN
500.0000 [IU] | Freq: Once | INTRAVENOUS | Status: AC | PRN
  Administered 2020-06-07: 500 [IU]
  Filled 2020-06-07: qty 5

## 2020-06-07 MED ORDER — SODIUM CHLORIDE 0.9% FLUSH
10.0000 mL | INTRAVENOUS | Status: DC | PRN
Start: 2020-06-07 — End: 2020-06-07
  Administered 2020-06-07: 10 mL
  Filled 2020-06-07: qty 10

## 2020-06-07 NOTE — Progress Notes (Signed)
Patient discussed at flush appt her nail changes with darkening of nail beds. We discussed doing a nail soak of Tea Tree Oil with Dial antibacterial soap with warm water for 10 minutes a day. Encouraged patient to call with any additional issues or questions. Verbalized understanding.

## 2020-06-14 ENCOUNTER — Other Ambulatory Visit: Payer: Self-pay

## 2020-06-14 ENCOUNTER — Inpatient Hospital Stay: Payer: Medicare HMO

## 2020-06-14 DIAGNOSIS — C3491 Malignant neoplasm of unspecified part of right bronchus or lung: Secondary | ICD-10-CM

## 2020-06-14 DIAGNOSIS — Z95828 Presence of other vascular implants and grafts: Secondary | ICD-10-CM

## 2020-06-14 DIAGNOSIS — Z5111 Encounter for antineoplastic chemotherapy: Secondary | ICD-10-CM | POA: Diagnosis not present

## 2020-06-14 LAB — CBC WITH DIFFERENTIAL (CANCER CENTER ONLY)
Abs Immature Granulocytes: 0.06 10*3/uL (ref 0.00–0.07)
Basophils Absolute: 0 10*3/uL (ref 0.0–0.1)
Basophils Relative: 0 %
Eosinophils Absolute: 0 10*3/uL (ref 0.0–0.5)
Eosinophils Relative: 0 %
HCT: 29.8 % — ABNORMAL LOW (ref 36.0–46.0)
Hemoglobin: 9.6 g/dL — ABNORMAL LOW (ref 12.0–15.0)
Immature Granulocytes: 1 %
Lymphocytes Relative: 28 %
Lymphs Abs: 2 10*3/uL (ref 0.7–4.0)
MCH: 33 pg (ref 26.0–34.0)
MCHC: 32.2 g/dL (ref 30.0–36.0)
MCV: 102.4 fL — ABNORMAL HIGH (ref 80.0–100.0)
Monocytes Absolute: 0.3 10*3/uL (ref 0.1–1.0)
Monocytes Relative: 5 %
Neutro Abs: 4.7 10*3/uL (ref 1.7–7.7)
Neutrophils Relative %: 66 %
Platelet Count: 157 10*3/uL (ref 150–400)
RBC: 2.91 MIL/uL — ABNORMAL LOW (ref 3.87–5.11)
RDW: 16.9 % — ABNORMAL HIGH (ref 11.5–15.5)
WBC Count: 7 10*3/uL (ref 4.0–10.5)
nRBC: 0 % (ref 0.0–0.2)

## 2020-06-14 LAB — CMP (CANCER CENTER ONLY)
ALT: 8 U/L (ref 0–44)
AST: 11 U/L — ABNORMAL LOW (ref 15–41)
Albumin: 3.5 g/dL (ref 3.5–5.0)
Alkaline Phosphatase: 70 U/L (ref 38–126)
Anion gap: 10 (ref 5–15)
BUN: 16 mg/dL (ref 8–23)
CO2: 29 mmol/L (ref 22–32)
Calcium: 9.1 mg/dL (ref 8.9–10.3)
Chloride: 102 mmol/L (ref 98–111)
Creatinine: 0.84 mg/dL (ref 0.44–1.00)
GFR, Estimated: >60 mL/min (ref >60)
Glucose, Bld: 82 mg/dL (ref 70–99)
Potassium: 3.3 mmol/L — ABNORMAL LOW (ref 3.5–5.1)
Sodium: 141 mmol/L (ref 135–145)
Total Bilirubin: 0.6 mg/dL (ref 0.3–1.2)
Total Protein: 6.4 g/dL — ABNORMAL LOW (ref 6.5–8.1)

## 2020-06-14 LAB — TOTAL PROTEIN, URINE DIPSTICK: Protein, ur: NEGATIVE mg/dL

## 2020-06-14 MED ORDER — SODIUM CHLORIDE 0.9% FLUSH
10.0000 mL | INTRAVENOUS | Status: DC | PRN
  Administered 2020-06-14: 10 mL
  Filled 2020-06-14: qty 10

## 2020-06-14 MED ORDER — HEPARIN SOD (PORK) LOCK FLUSH 100 UNIT/ML IV SOLN
500.0000 [IU] | Freq: Once | INTRAVENOUS | Status: AC | PRN
  Administered 2020-06-14: 500 [IU]
  Filled 2020-06-14: qty 5

## 2020-06-21 ENCOUNTER — Inpatient Hospital Stay (HOSPITAL_BASED_OUTPATIENT_CLINIC_OR_DEPARTMENT_OTHER): Payer: Medicare HMO | Admitting: Internal Medicine

## 2020-06-21 ENCOUNTER — Inpatient Hospital Stay: Payer: Medicare HMO

## 2020-06-21 ENCOUNTER — Other Ambulatory Visit: Payer: Self-pay

## 2020-06-21 DIAGNOSIS — C3491 Malignant neoplasm of unspecified part of right bronchus or lung: Secondary | ICD-10-CM

## 2020-06-21 DIAGNOSIS — C3490 Malignant neoplasm of unspecified part of unspecified bronchus or lung: Secondary | ICD-10-CM

## 2020-06-21 DIAGNOSIS — Z95828 Presence of other vascular implants and grafts: Secondary | ICD-10-CM

## 2020-06-21 DIAGNOSIS — Z5111 Encounter for antineoplastic chemotherapy: Secondary | ICD-10-CM | POA: Diagnosis not present

## 2020-06-21 LAB — CBC WITH DIFFERENTIAL (CANCER CENTER ONLY)
Abs Immature Granulocytes: 0.03 10*3/uL (ref 0.00–0.07)
Basophils Absolute: 0 10*3/uL (ref 0.0–0.1)
Basophils Relative: 0 %
Eosinophils Absolute: 0 10*3/uL (ref 0.0–0.5)
Eosinophils Relative: 0 %
HCT: 28.9 % — ABNORMAL LOW (ref 36.0–46.0)
Hemoglobin: 9.3 g/dL — ABNORMAL LOW (ref 12.0–15.0)
Immature Granulocytes: 0 %
Lymphocytes Relative: 13 %
Lymphs Abs: 1 10*3/uL (ref 0.7–4.0)
MCH: 33.1 pg (ref 26.0–34.0)
MCHC: 32.2 g/dL (ref 30.0–36.0)
MCV: 102.8 fL — ABNORMAL HIGH (ref 80.0–100.0)
Monocytes Absolute: 0.4 10*3/uL (ref 0.1–1.0)
Monocytes Relative: 5 %
Neutro Abs: 6.2 10*3/uL (ref 1.7–7.7)
Neutrophils Relative %: 82 %
Platelet Count: 249 10*3/uL (ref 150–400)
RBC: 2.81 MIL/uL — ABNORMAL LOW (ref 3.87–5.11)
RDW: 17 % — ABNORMAL HIGH (ref 11.5–15.5)
WBC Count: 7.6 10*3/uL (ref 4.0–10.5)
nRBC: 0 % (ref 0.0–0.2)

## 2020-06-21 LAB — CMP (CANCER CENTER ONLY)
ALT: 7 U/L (ref 0–44)
AST: 12 U/L — ABNORMAL LOW (ref 15–41)
Albumin: 3.7 g/dL (ref 3.5–5.0)
Alkaline Phosphatase: 54 U/L (ref 38–126)
Anion gap: 9 (ref 5–15)
BUN: 31 mg/dL — ABNORMAL HIGH (ref 8–23)
CO2: 27 mmol/L (ref 22–32)
Calcium: 9.2 mg/dL (ref 8.9–10.3)
Chloride: 104 mmol/L (ref 98–111)
Creatinine: 0.88 mg/dL (ref 0.44–1.00)
GFR, Estimated: >60 mL/min (ref >60)
Glucose, Bld: 132 mg/dL — ABNORMAL HIGH (ref 70–99)
Potassium: 3.6 mmol/L (ref 3.5–5.1)
Sodium: 140 mmol/L (ref 135–145)
Total Bilirubin: 0.5 mg/dL (ref 0.3–1.2)
Total Protein: 6.6 g/dL (ref 6.5–8.1)

## 2020-06-21 MED ORDER — DIPHENHYDRAMINE HCL 50 MG/ML IJ SOLN
50.0000 mg | Freq: Once | INTRAMUSCULAR | Status: AC
  Administered 2020-06-21: 50 mg via INTRAVENOUS

## 2020-06-21 MED ORDER — ACETAMINOPHEN 325 MG PO TABS
650.0000 mg | ORAL_TABLET | Freq: Once | ORAL | Status: AC
  Administered 2020-06-21: 650 mg via ORAL

## 2020-06-21 MED ORDER — SODIUM CHLORIDE 0.9 % IV SOLN
75.0000 mg/m2 | Freq: Once | INTRAVENOUS | Status: AC
  Administered 2020-06-21: 120 mg via INTRAVENOUS
  Filled 2020-06-21: qty 12

## 2020-06-21 MED ORDER — HEPARIN SOD (PORK) LOCK FLUSH 100 UNIT/ML IV SOLN
500.0000 [IU] | Freq: Once | INTRAVENOUS | Status: AC | PRN
  Administered 2020-06-21: 500 [IU]
  Filled 2020-06-21: qty 5

## 2020-06-21 MED ORDER — SODIUM CHLORIDE 0.9 % IV SOLN
10.0000 mg/kg | Freq: Once | INTRAVENOUS | Status: AC
  Administered 2020-06-21: 600 mg via INTRAVENOUS
  Filled 2020-06-21: qty 50

## 2020-06-21 MED ORDER — DIPHENHYDRAMINE HCL 50 MG/ML IJ SOLN
INTRAMUSCULAR | Status: AC
  Filled 2020-06-21: qty 1

## 2020-06-21 MED ORDER — ACETAMINOPHEN 325 MG PO TABS
ORAL_TABLET | ORAL | Status: AC
  Filled 2020-06-21: qty 2

## 2020-06-21 MED ORDER — SODIUM CHLORIDE 0.9 % IV SOLN
10.0000 mg | Freq: Once | INTRAVENOUS | Status: AC
  Administered 2020-06-21: 10 mg via INTRAVENOUS
  Filled 2020-06-21: qty 10

## 2020-06-21 MED ORDER — SODIUM CHLORIDE 0.9 % IV SOLN
Freq: Once | INTRAVENOUS | Status: AC
  Filled 2020-06-21: qty 250

## 2020-06-21 MED ORDER — SODIUM CHLORIDE 0.9% FLUSH
10.0000 mL | INTRAVENOUS | Status: DC | PRN
  Administered 2020-06-21: 10 mL
  Filled 2020-06-21: qty 10

## 2020-06-21 NOTE — Patient Instructions (Signed)
Implanted [facility] Guide An implanted port is a device that is placed under the skin. It is usually placed in the chest. The device can be used to give IV medicine, to take blood, or for dialysis. You may have an implanted port if:  You need IV medicine that would be irritating to the small veins in your hands or arms.  You need IV medicines, such as antibiotics, for a long period of time.  You need IV nutrition for a long period of time.  You need dialysis. When you have a port, your health care provider can choose to use the port instead of veins in your arms for these procedures. You may have fewer limitations when using a port than you would if you used other types of long-term IVs, and you will likely be able to return to normal activities after your incision heals. An implanted port has two main parts:  Reservoir. The reservoir is the part where a needle is inserted to give medicines or draw blood. The reservoir is round. After it is placed, it appears as a small, raised area under your skin.  Catheter. The catheter is a thin, flexible tube that connects the reservoir to a vein. Medicine that is inserted into the reservoir goes into the catheter and then into the vein. How is my port accessed? To access your port:  A numbing cream may be placed on the skin over the port site.  Your health care provider will put on a mask and sterile gloves.  The skin over your port will be cleaned carefully with a germ-killing soap and allowed to dry.  Your health care provider will gently pinch the port and insert a needle into it.  Your health care provider will check for a blood return to make sure the port is in the vein and is not clogged.  If your port needs to remain accessed to get medicine continuously (constant infusion), your health care provider will place a clear bandage (dressing) over the needle site. The dressing and needle will need to be changed every week, or as told by your  health care provider. What is flushing? Flushing helps keep the port from getting clogged. Follow instructions from your health care provider about how and when to flush the port. Ports are usually flushed with saline solution or a medicine called heparin. The need for flushing will depend on how the port is used:  If the port is only used from time to time to give medicines or draw blood, the port may need to be flushed: ? Before and after medicines have been given. ? Before and after blood has been drawn. ? As part of routine maintenance. Flushing may be recommended every 4-6 weeks.  If a constant infusion is running, the port may not need to be flushed.  Throw away any syringes in a disposal container that is meant for sharp items (sharps container). You can buy a sharps container from a pharmacy, or you can make one by using an empty hard plastic bottle with a cover. How long will my port stay implanted? The port can stay in for as long as your health care provider thinks it is needed. When it is time for the port to come out, a surgery will be done to remove it. The surgery will be similar to the procedure that was done to put the port in. Follow these instructions at home:  Flush your port as told by your health care  provider.  If you need an infusion over several days, follow instructions from your health care provider about how to take care of your port site. Make sure you: ? Wash your hands with soap and water before you change your dressing. If soap and water are not available, use alcohol-based hand sanitizer. ? Change your dressing as told by your health care provider. ? Place any used dressings or infusion bags into a plastic bag. Throw that bag in the trash. ? Keep the dressing that covers the needle clean and dry. Do not get it wet. ? Do not use scissors or sharp objects near the tube. ? Keep the tube clamped, unless it is being used.  Check your port site every day for signs  of infection. Check for: ? Redness, swelling, or pain. ? Fluid or blood. ? Pus or a bad smell.  Protect the skin around the port site. ? Avoid wearing bra straps that rub or irritate the site. ? Protect the skin around your port from seat belts. Place a soft pad over your chest if needed.  Bathe or shower as told by your health care provider. The site may get wet as long as you are not actively receiving an infusion.  Return to your normal activities as told by your health care provider. Ask your health care provider what activities are safe for you.  Carry a medical alert card or wear a medical alert bracelet at all times. This will let health care providers know that you have an implanted port in case of an emergency.   Get help right away if:  You have redness, swelling, or pain at the port site.  You have fluid or blood coming from your port site.  You have pus or a bad smell coming from the port site.  You have a fever. Summary  Implanted ports are usually placed in the chest for long-term IV access.  Follow instructions from your health care provider about flushing the port and changing bandages (dressings).  Take care of the area around your port by avoiding clothing that puts pressure on the area, and by watching for signs of infection.  Protect the skin around your port from seat belts. Place a soft pad over your chest if needed.  Get help right away if you have a fever or you have redness, swelling, pain, drainage, or a bad smell at the port site. This information is not intended to replace advice given to you by your health care provider. Make sure you discuss any questions you have with your health care provider. Document Revised: 07/26/2019 Document Reviewed: 07/26/2019 Elsevier Patient Education  [place].

## 2020-06-21 NOTE — Patient Instructions (Signed)
[facility] Discharge Instructions for Patients Receiving Chemotherapy  Today you received the following chemotherapy agents ramacirumab, docetaxel  To help prevent nausea and vomiting after your treatment, we encourage you to take your nausea medication as directed.   If you develop nausea and vomiting that is not controlled by your nausea medication, call the clinic.   BELOW ARE SYMPTOMS THAT SHOULD BE REPORTED IMMEDIATELY:  *FEVER GREATER THAN 100.5 F  *CHILLS WITH OR WITHOUT FEVER  NAUSEA AND VOMITING THAT IS NOT CONTROLLED WITH YOUR NAUSEA MEDICATION  *UNUSUAL SHORTNESS OF BREATH  *UNUSUAL BRUISING OR BLEEDING  TENDERNESS IN MOUTH AND THROAT WITH OR WITHOUT PRESENCE OF ULCERS  *URINARY PROBLEMS  *BOWEL PROBLEMS  UNUSUAL RASH Items with * indicate a potential emergency and should be followed up as soon as possible.  Feel free to call the clinic should you have any questions or concerns. The clinic phone number is [phone].  Please show the [facility] at check-in to the Emergency Department and triage nurse.

## 2020-06-21 NOTE — Progress Notes (Signed)
[facility] [phone]   [phone]  OFFICE PROGRESS NOTE  [name], MD [place] [address]  DIAGNOSIS: Stage IV non-small cell lung cancer, squamous cell carcinoma. She presented withright upper lobe lung mass in addition to pleural-based metastasis andmediastinal lymphadenopathy.She was diagnosed in September 2020.  PRIOR THERAPY: Chemotherapy with carboplatin for an AUC of 5,paclitaxel 175 mg/m, and Keytruda 200 mg IV every 3 weeks with Neulasta support.Last dose9/15/21.Status post18cycles. Starting from cycle #5 was on maintenance treatment with single agent Keytruda every 3 weeks.This was discontinued due to evidence of disease progression.   CURRENT THERAPY: Palliative systemic chemotherapy with docetaxel 75 mg per metered squared and Cyramza 10 mg/kg IV every 3 weeks with Neulasta support. First dose on 01/04/20.  Status post 8 cycles.  INTERVAL HISTORY: Debra Rodgers 75 y.o. female returns to the clinic today for follow-up visit.  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  She denied having any current chest pain, shortness of breath except with exertion with no cough or hemoptysis.  She denied having any fever or chills.  She has no nausea, vomiting, diarrhea or constipation.  She has no headache or visual changes.  She is here today for evaluation before starting cycle #9 of her treatment.    MEDICAL HISTORY: Past Medical History:  Diagnosis Date  . AAA (abdominal aortic aneurysm) ([place])   . COPD (chronic obstructive pulmonary disease) ([place])   . Depression   . Essential hypertension   . Headache   . History of migraine headaches   . lung ca dx'd 10/2018  . Sleep apnea   . Tobacco use disorder     ALLERGIES:  is allergic to codeine sulfate  and pantoprazole sodium.  MEDICATIONS:  Current Outpatient Medications  Medication Sig Dispense Refill  . acetaminophen (TYLENOL) 500 MG tablet Take 500 mg by mouth every 6 (six) hours as needed for moderate pain.    Marland Kitchen albuterol (VENTOLIN HFA) 108 (90 Base) MCG/ACT inhaler Inhale 2 puffs into the lungs every 6 (six) hours as needed for wheezing or shortness of breath. 36 g 2  . amLODipine (NORVASC) 10 MG tablet Take 1 tablet (10 mg total) by mouth daily. 90 tablet 3  . atorvastatin (LIPITOR) 20 MG tablet TAKE 1 TABLET EVERY DAY 90 tablet 3  . benzonatate (TESSALON) 100 MG capsule TAKE 1 CAPSULE EVERY 8 HOURS AS NEEDED FOR COUGH (Patient taking differently: Take 100 mg by mouth 3 (three) times daily as needed for cough.) 90 capsule 0  . chlorthalidone (HYGROTON) 50 MG tablet TAKE 1 TABLET EVERY DAY (Patient taking differently: Take 50 mg by mouth daily.) 90 tablet 3  . dexamethasone (DECADRON) 4 MG tablet Please take TWO tablets TWICE a day the day before, the day of, and the day after chemotherapy (Patient taking differently: Take 8 mg by mouth See admin instructions. Please take TWO tablets TWICE a day the day before, the day of, and the day after chemotherapy) 120 tablet 2  . famotidine (PEPCID) 20 MG tablet Take 20 mg by mouth daily.     Marland Kitchen FLUoxetine (PROZAC) 20 MG capsule TAKE 1 CAPSULE EVERY DAY (Patient taking differently: Take 20 mg by mouth daily.) 90 capsule 3  . gabapentin (NEURONTIN) 100 MG capsule Take 1 capsule (100 mg total)  by mouth 2 (two) times daily. 90 capsule 2  . lidocaine-prilocaine (EMLA) cream Apply 1 application topically as needed. (Patient taking differently: Apply 1 application topically as needed (for chemotherapy).) 30 g 0  . magnesium oxide (MAG-OX) 400 MG tablet Take 1 tablet (400 mg total) by mouth daily. 30 tablet 1  . polyvinyl alcohol (LIQUIFILM TEARS) 1.4 % ophthalmic solution Place 1 drop into both eyes as needed for dry eyes.    . potassium chloride SA  (KLOR-CON M20) 20 MEQ tablet Take 1 tablet (20 mEq total) by mouth daily. 10 tablet 0  . fluticasone (FLONASE) 50 MCG/ACT nasal spray Place 1 spray into both nostrils daily. 11.1 mL 0  . HYDROcodone-acetaminophen (NORCO) 7.5-325 MG tablet Take 1 tablet by mouth every 6 (six) hours as needed for moderate pain. (Patient not taking: Reported on 06/21/2020) 60 tablet 0   No current facility-administered medications for this visit.    SURGICAL HISTORY:  Past Surgical History:  Procedure Laterality Date  . ABDOMINAL HYSTERECTOMY    . CHOLECYSTECTOMY    . IR IMAGING GUIDED PORT INSERTION  02/24/2019  . ROTATOR CUFF REPAIR  3/04  . VIDEO BRONCHOSCOPY WITH ENDOBRONCHIAL ULTRASOUND Right 11/25/2018   Procedure: VIDEO BRONCHOSCOPY WITH ENDOBRONCHIAL ULTRASOUND;  Surgeon: Collene Gobble, MD;  Location: [facility] OR;  Service: Thoracic;  Laterality: Right;    REVIEW OF SYSTEMS:  A comprehensive review of systems was negative except for: Constitutional: positive for fatigue Integument/breast: positive for skin color change   PHYSICAL EXAMINATION: General appearance: alert, cooperative, fatigued and no distress Head: Normocephalic, without obvious abnormality, atraumatic Neck: no adenopathy, no JVD, supple, symmetrical, trachea midline and thyroid not enlarged, symmetric, no tenderness/mass/nodules Lymph nodes: Cervical, supraclavicular, and axillary nodes normal. Resp: clear to auscultation bilaterally Back: symmetric, no curvature. ROM normal. No CVA tenderness. Cardio: regular rate and rhythm, S1, S2 normal, no murmur, click, rub or gallop GI: soft, non-tender; bowel sounds normal; no masses,  no organomegaly Extremities: extremities normal, atraumatic, no cyanosis or edema  ECOG PERFORMANCE STATUS: 1 - Symptomatic but completely ambulatory  Blood pressure 123/87, pulse 82, temperature (!) 97.2 F (36.2 C), temperature source Tympanic, resp. rate 17, weight 139 lb 8 oz (63.3 kg), SpO2 100  %.  LABORATORY DATA: Lab Results  Component Value Date   WBC 7.6 06/21/2020   HGB 9.3 (L) 06/21/2020   HCT 28.9 (L) 06/21/2020   MCV 102.8 (H) 06/21/2020   PLT 249 06/21/2020      Chemistry      Component Value Date/Time   NA 140 06/21/2020 1053   NA 139 07/21/2017 0958   K 3.6 06/21/2020 1053   CL 104 06/21/2020 1053   CO2 27 06/21/2020 1053   BUN 31 (H) 06/21/2020 1053   BUN 26 07/21/2017 0958   CREATININE 0.88 06/21/2020 1053   CREATININE 0.79 09/03/2013 1540      Component Value Date/Time   CALCIUM 9.2 06/21/2020 1053   ALKPHOS 54 06/21/2020 1053   AST 12 (L) 06/21/2020 1053   ALT 7 06/21/2020 1053   BILITOT 0.5 06/21/2020 1053       RADIOGRAPHIC STUDIES: No results found.  ASSESSMENT AND PLAN: This is a very pleasant 75 years old African-American female with stage IV non-small cell carcinoma,, squamous cell carcinoma diagnosed in September 2020.  She presented with extensive right-sided pleural and thoracic nodal hypermetabolic disease with no extrathoracic disease. The patient started induction treatment with systemic chemotherapy with carboplatin, paclitaxel and Keytruda status post 4 cycles  with partial response after cycle #4.  This was followed by 14 cycles of maintenance treatment with single agent Keytruda discontinued secondary to disease progression. The patient is currently undergoing second line systemic chemotherapy with docetaxel 75 mg/M2 and Cyramza 10 mg/KG every 3 weeks with Neulasta support.  Status post 8 cycles. The patient continues to tolerate this treatment well except for mild peripheral neuropathy as well as skin color change and mild paronychia. I recommended for the patient to proceed with cycle #9 today as planned. I will see her back for follow-up visit in 3 weeks for evaluation with repeat CT scan of the chest, abdomen pelvis for restaging of her disease. For the history of deep venous thrombosis, she will continue her current treatment  with Xarelto. The patient was advised to call immediately if she has any concerning symptoms in the interval. The patient voices understanding of current disease status and treatment options and is in agreement with the current care plan. All questions were answered. The patient knows to call the clinic with any problems, questions or concerns. We can certainly see the patient much sooner if necessary.  Disclaimer: This note was dictated with voice recognition software. Similar sounding words can inadvertently be transcribed and may not be corrected upon review.

## 2020-06-23 ENCOUNTER — Inpatient Hospital Stay: Payer: Medicare HMO | Attending: Internal Medicine

## 2020-06-23 ENCOUNTER — Other Ambulatory Visit: Payer: Self-pay

## 2020-06-23 VITALS — BP 131/83 | HR 76 | Temp 99.0°F | Resp 24

## 2020-06-23 DIAGNOSIS — Z5189 Encounter for other specified aftercare: Secondary | ICD-10-CM | POA: Diagnosis not present

## 2020-06-23 DIAGNOSIS — I10 Essential (primary) hypertension: Secondary | ICD-10-CM | POA: Insufficient documentation

## 2020-06-23 DIAGNOSIS — C3411 Malignant neoplasm of upper lobe, right bronchus or lung: Secondary | ICD-10-CM | POA: Diagnosis not present

## 2020-06-23 DIAGNOSIS — Z9221 Personal history of antineoplastic chemotherapy: Secondary | ICD-10-CM | POA: Diagnosis not present

## 2020-06-23 DIAGNOSIS — C3491 Malignant neoplasm of unspecified part of right bronchus or lung: Secondary | ICD-10-CM

## 2020-06-23 DIAGNOSIS — Z5111 Encounter for antineoplastic chemotherapy: Secondary | ICD-10-CM | POA: Insufficient documentation

## 2020-06-23 DIAGNOSIS — C782 Secondary malignant neoplasm of pleura: Secondary | ICD-10-CM | POA: Insufficient documentation

## 2020-06-23 DIAGNOSIS — G43909 Migraine, unspecified, not intractable, without status migrainosus: Secondary | ICD-10-CM | POA: Insufficient documentation

## 2020-06-23 DIAGNOSIS — I714 Abdominal aortic aneurysm, without rupture: Secondary | ICD-10-CM | POA: Insufficient documentation

## 2020-06-23 DIAGNOSIS — J439 Emphysema, unspecified: Secondary | ICD-10-CM | POA: Diagnosis not present

## 2020-06-23 DIAGNOSIS — R599 Enlarged lymph nodes, unspecified: Secondary | ICD-10-CM | POA: Insufficient documentation

## 2020-06-23 DIAGNOSIS — J449 Chronic obstructive pulmonary disease, unspecified: Secondary | ICD-10-CM | POA: Insufficient documentation

## 2020-06-23 MED ORDER — PEGFILGRASTIM-CBQV 6 MG/0.6ML ~~LOC~~ SOSY
PREFILLED_SYRINGE | SUBCUTANEOUS | Status: AC
  Filled 2020-06-23: qty 0.6

## 2020-06-23 MED ORDER — PEGFILGRASTIM-JMDB 6 MG/0.6ML ~~LOC~~ SOSY
PREFILLED_SYRINGE | SUBCUTANEOUS | Status: AC
  Filled 2020-06-23: qty 0.6

## 2020-06-23 MED ORDER — PEGFILGRASTIM-JMDB 6 MG/0.6ML ~~LOC~~ SOSY
6.0000 mg | PREFILLED_SYRINGE | Freq: Once | SUBCUTANEOUS | Status: AC
  Administered 2020-06-23: 6 mg via SUBCUTANEOUS

## 2020-06-23 NOTE — Patient Instructions (Signed)
Pegfilgrastim injection What is this medicine? PEGFILGRASTIM (PEG fil gra stim) is a long-acting granulocyte colony-stimulating factor that stimulates the growth of neutrophils, a type of white blood cell important in the body's fight against infection. It is used to reduce the incidence of fever and infection in patients with certain types of cancer who are receiving chemotherapy that affects the bone marrow, and to increase survival after being exposed to high doses of radiation. This medicine may be used for other purposes; ask your health care provider or pharmacist if you have questions. COMMON BRAND NAME(S): Rexene Edison, Ziextenzo What should I tell my health care provider before I take this medicine? They need to know if you have any of these conditions:  kidney disease  latex allergy  ongoing radiation therapy  sickle cell disease  skin reactions to acrylic adhesives (On-Body Injector only)  an unusual or allergic reaction to pegfilgrastim, filgrastim, other medicines, foods, dyes, or preservatives  pregnant or trying to get pregnant  breast-feeding How should I use this medicine? This medicine is for injection under the skin. If you get this medicine at home, you will be taught how to prepare and give the pre-filled syringe or how to use the On-body Injector. Refer to the patient Instructions for Use for detailed instructions. Use exactly as directed. Tell your healthcare provider immediately if you suspect that the On-body Injector may not have performed as intended or if you suspect the use of the On-body Injector resulted in a missed or partial dose. It is important that you put your used needles and syringes in a special sharps container. Do not put them in a trash can. If you do not have a sharps container, call your pharmacist or healthcare provider to get one. Talk to your pediatrician regarding the use of this medicine in children. While this drug  may be prescribed for selected conditions, precautions do apply. Overdosage: If you think you have taken too much of this medicine contact a poison control center or emergency room at once. NOTE: This medicine is only for you. Do not share this medicine with others. What if I miss a dose? It is important not to miss your dose. Call your doctor or health care professional if you miss your dose. If you miss a dose due to an On-body Injector failure or leakage, a new dose should be administered as soon as possible using a single prefilled syringe for manual use. What may interact with this medicine? Interactions have not been studied. This list may not describe all possible interactions. Give your health care provider a list of all the medicines, herbs, non-prescription drugs, or dietary supplements you use. Also tell them if you smoke, drink alcohol, or use illegal drugs. Some items may interact with your medicine. What should I watch for while using this medicine? Your condition will be monitored carefully while you are receiving this medicine. You may need blood work done while you are taking this medicine. Talk to your health care provider about your risk of cancer. You may be more at risk for certain types of cancer if you take this medicine. If you are going to need a MRI, CT scan, or other procedure, tell your doctor that you are using this medicine (On-Body Injector only). What side effects may I notice from receiving this medicine? Side effects that you should report to your doctor or health care professional as soon as possible:  allergic reactions (skin rash, itching or hives, swelling of  the face, lips, or tongue)  back pain  dizziness  fever  pain, redness, or irritation at site where injected  pinpoint red spots on the skin  red or dark-brown urine  shortness of breath or breathing problems  stomach or side pain, or pain at the shoulder  swelling  tiredness  trouble  passing urine or change in the amount of urine  unusual bruising or bleeding Side effects that usually do not require medical attention (report to your doctor or health care professional if they continue or are bothersome):  bone pain  muscle pain This list may not describe all possible side effects. Call your doctor for medical advice about side effects. You may report side effects to FDA at 1-800-FDA-1088. Where should I keep my medicine? Keep out of the reach of children. If you are using this medicine at home, you will be instructed on how to store it. Throw away any unused medicine after the expiration date on the label. NOTE: This sheet is a summary. It may not cover all possible information. If you have questions about this medicine, talk to your doctor, pharmacist, or health care provider.  2021 Elsevier/Gold Standard (2019-04-02 13:20:51)

## 2020-06-26 ENCOUNTER — Telehealth: Payer: Self-pay | Admitting: Internal Medicine

## 2020-06-26 NOTE — Telephone Encounter (Signed)
Scheduled per los. Called and left msg. Mailed prinotout

## 2020-06-28 ENCOUNTER — Inpatient Hospital Stay: Payer: Medicare HMO

## 2020-06-28 ENCOUNTER — Other Ambulatory Visit: Payer: Self-pay

## 2020-06-28 DIAGNOSIS — Z5111 Encounter for antineoplastic chemotherapy: Secondary | ICD-10-CM | POA: Diagnosis not present

## 2020-06-28 DIAGNOSIS — C3491 Malignant neoplasm of unspecified part of right bronchus or lung: Secondary | ICD-10-CM

## 2020-06-28 DIAGNOSIS — Z95828 Presence of other vascular implants and grafts: Secondary | ICD-10-CM

## 2020-06-28 LAB — CMP (CANCER CENTER ONLY)
ALT: 10 U/L (ref 0–44)
AST: 12 U/L — ABNORMAL LOW (ref 15–41)
Albumin: 3.4 g/dL — ABNORMAL LOW (ref 3.5–5.0)
Alkaline Phosphatase: 56 U/L (ref 38–126)
Anion gap: 10 (ref 5–15)
BUN: 25 mg/dL — ABNORMAL HIGH (ref 8–23)
CO2: 29 mmol/L (ref 22–32)
Calcium: 8.8 mg/dL — ABNORMAL LOW (ref 8.9–10.3)
Chloride: 100 mmol/L (ref 98–111)
Creatinine: 0.87 mg/dL (ref 0.44–1.00)
GFR, Estimated: >60 mL/min (ref >60)
Glucose, Bld: 84 mg/dL (ref 70–99)
Potassium: 3.3 mmol/L — ABNORMAL LOW (ref 3.5–5.1)
Sodium: 139 mmol/L (ref 135–145)
Total Bilirubin: 1.1 mg/dL (ref 0.3–1.2)
Total Protein: 6.2 g/dL — ABNORMAL LOW (ref 6.5–8.1)

## 2020-06-28 LAB — CBC WITH DIFFERENTIAL (CANCER CENTER ONLY)
Abs Immature Granulocytes: 0.18 10*3/uL — ABNORMAL HIGH (ref 0.00–0.07)
Basophils Absolute: 0 10*3/uL (ref 0.0–0.1)
Basophils Relative: 1 %
Eosinophils Absolute: 0 10*3/uL (ref 0.0–0.5)
Eosinophils Relative: 1 %
HCT: 29.3 % — ABNORMAL LOW (ref 36.0–46.0)
Hemoglobin: 9.6 g/dL — ABNORMAL LOW (ref 12.0–15.0)
Immature Granulocytes: 6 %
Lymphocytes Relative: 51 %
Lymphs Abs: 1.5 10*3/uL (ref 0.7–4.0)
MCH: 33.1 pg (ref 26.0–34.0)
MCHC: 32.8 g/dL (ref 30.0–36.0)
MCV: 101 fL — ABNORMAL HIGH (ref 80.0–100.0)
Monocytes Absolute: 0.5 10*3/uL (ref 0.1–1.0)
Monocytes Relative: 17 %
Neutro Abs: 0.7 10*3/uL — ABNORMAL LOW (ref 1.7–7.7)
Neutrophils Relative %: 24 %
Platelet Count: 115 10*3/uL — ABNORMAL LOW (ref 150–400)
RBC: 2.9 MIL/uL — ABNORMAL LOW (ref 3.87–5.11)
RDW: 16.7 % — ABNORMAL HIGH (ref 11.5–15.5)
WBC Count: 2.9 10*3/uL — ABNORMAL LOW (ref 4.0–10.5)
nRBC: 0 % (ref 0.0–0.2)

## 2020-06-28 MED ORDER — SODIUM CHLORIDE 0.9% FLUSH
10.0000 mL | INTRAVENOUS | Status: DC | PRN
  Administered 2020-06-28: 10 mL
  Filled 2020-06-28: qty 10

## 2020-06-28 MED ORDER — HEPARIN SOD (PORK) LOCK FLUSH 100 UNIT/ML IV SOLN
500.0000 [IU] | Freq: Once | INTRAVENOUS | Status: AC | PRN
  Administered 2020-06-28: 500 [IU]
  Filled 2020-06-28: qty 5

## 2020-07-04 ENCOUNTER — Telehealth: Payer: Self-pay | Admitting: Physician Assistant

## 2020-07-04 NOTE — Telephone Encounter (Signed)
I am scheduled to see this patient next week on 07/12/20. She is supposed to have a restaging CT scan prior to her next appointment. I noticed it has not been scheduled at this time. I tried calling her home phone number but it kept ringing. I called her mobile number and left a voicemail detailing that she should call radiology to schedule her scan before her next visit on 07/12/20. Left our call back number if she had any questions.

## 2020-07-04 NOTE — Progress Notes (Signed)
[facility] OFFICE PROGRESS NOTE  [name], MD [address]  [place] [address]  DIAGNOSIS: Stage IV non-small cell lung cancer, squamous cell carcinoma. She presented withright upper lobe lung mass in addition to pleural-based metastasis andmediastinal lymphadenopathy.She was diagnosed in September 2020.  PRIOR THERAPY: Chemotherapy with carboplatin for an AUC of 5,paclitaxel 175 mg/m, and Keytruda 200 mg IV every 3 weeks with Neulasta support.Last dose9/15/21.Status post18cycles. Starting from cycle #5wason maintenance treatment with single agent Keytruda every 3 weeks.This was discontinued due to evidence of disease progression.  CURRENT THERAPY: Palliative systemic chemotherapy with docetaxel 75 mg per metered squared and Cyramza 10 mg/kg IV every 3 weeks with Neulasta support. First dose on 01/04/20. Status post9cycles.Her dose of docetaxel was reduced to 65 mg/m2 starting from cycle #10 due to nail changes.   INTERVAL HISTORY: Debra Rodgers 75 y.o. female returns to the clinic today for a follow up visit. The patient is feeling fairly well today without any concerning complaints. She has been tolerating her treatment fairly well except she is starting to develop peripheral neuropathy and skin darkening from her treatment. She is taking gabapentin.  She denies any fever or weight loss.The patient reports her baseline intermittent night sweats. She reports her baseline dyspnea on exertion and cough. If she has wheezing, she uses her inhaler with improvement in her symptoms. She denies any hemoptysis. She denies any nausea, vomiting, or constipation. She experiences baseline intermittent diarrhea on and off for several years that "depends on what she eats". She denies headaches. The patient recently had a restaging CT scan performed. The patient is here for evaluation and to review her scan results before starting cycle #10.   MEDICAL  HISTORY: Past Medical History:  Diagnosis Date  . AAA (abdominal aortic aneurysm) ([place])   . COPD (chronic obstructive pulmonary disease) ([place])   . Depression   . Essential hypertension   . Headache   . History of migraine headaches   . lung ca dx'd 10/2018  . Sleep apnea   . Tobacco use disorder     ALLERGIES:  is allergic to codeine sulfate and pantoprazole sodium.  MEDICATIONS:  Current Outpatient Medications  Medication Sig Dispense Refill  . acetaminophen (TYLENOL) 500 MG tablet Take 500 mg by mouth every 6 (six) hours as needed for moderate pain.    Marland Kitchen albuterol (VENTOLIN HFA) 108 (90 Base) MCG/ACT inhaler Inhale 2 puffs into the lungs every 6 (six) hours as needed for wheezing or shortness of breath. 36 g 2  . amLODipine (NORVASC) 10 MG tablet Take 1 tablet (10 mg total) by mouth daily. 90 tablet 3  . atorvastatin (LIPITOR) 20 MG tablet TAKE 1 TABLET EVERY DAY 90 tablet 3  . benzonatate (TESSALON) 100 MG capsule TAKE 1 CAPSULE EVERY 8 HOURS AS NEEDED FOR COUGH (Patient taking differently: Take 100 mg by mouth 3 (three) times daily as needed for cough.) 90 capsule 0  . chlorthalidone (HYGROTON) 50 MG tablet TAKE 1 TABLET EVERY DAY (Patient taking differently: Take 50 mg by mouth daily.) 90 tablet 3  . dexamethasone (DECADRON) 4 MG tablet Please take TWO tablets TWICE a day the day before, the day of, and the day after chemotherapy (Patient taking differently: Take 8 mg by mouth See admin instructions. Please take TWO tablets TWICE a day the day before, the day of, and the day after chemotherapy) 120 tablet 2  . famotidine (PEPCID) 20 MG tablet Take 20 mg by mouth daily.     Marland Kitchen  FLUoxetine (PROZAC) 20 MG capsule TAKE 1 CAPSULE EVERY DAY (Patient taking differently: Take 20 mg by mouth daily.) 90 capsule 3  . fluticasone (FLONASE) 50 MCG/ACT nasal spray Place 1 spray into both nostrils daily. 11.1 mL 0  . gabapentin (NEURONTIN) 100 MG capsule Take 1 capsule (100 mg total) by mouth 2 (two)  times daily. 90 capsule 2  . HYDROcodone-acetaminophen (NORCO) 7.5-325 MG tablet Take 1 tablet by mouth every 6 (six) hours as needed for moderate pain. (Patient not taking: Reported on 06/21/2020) 60 tablet 0  . lidocaine-prilocaine (EMLA) cream Apply 1 application topically as needed. (Patient taking differently: Apply 1 application topically as needed (for chemotherapy).) 30 g 0  . magnesium oxide (MAG-OX) 400 MG tablet Take 1 tablet (400 mg total) by mouth daily. 30 tablet 1  . polyvinyl alcohol (LIQUIFILM TEARS) 1.4 % ophthalmic solution Place 1 drop into both eyes as needed for dry eyes.    . potassium chloride SA (KLOR-CON M20) 20 MEQ tablet Take 1 tablet (20 mEq total) by mouth daily. 10 tablet 0  . tiotropium (SPIRIVA HANDIHALER) 18 MCG inhalation capsule Place 1 capsule (18 mcg total) into inhaler and inhale daily. 30 capsule 2   No current facility-administered medications for this visit.    SURGICAL HISTORY:  Past Surgical History:  Procedure Laterality Date  . ABDOMINAL HYSTERECTOMY    . CHOLECYSTECTOMY    . IR IMAGING GUIDED PORT INSERTION  02/24/2019  . ROTATOR CUFF REPAIR  3/04  . VIDEO BRONCHOSCOPY WITH ENDOBRONCHIAL ULTRASOUND Right 11/25/2018   Procedure: VIDEO BRONCHOSCOPY WITH ENDOBRONCHIAL ULTRASOUND;  Surgeon: Collene Gobble, MD;  Location: [facility] OR;  Service: Thoracic;  Laterality: Right;    REVIEW OF SYSTEMS:   Review of Systems  Constitutional:Positive for fatigue.Negative forweight lossand fever HENT: Negative for nosebleeds, sore throat and trouble swallowing.  Eyes: Negative for eye problems and icterus.  Respiratory:Positive for mild cough and baseline dyspnea on exertion.Negative for hemoptysisand wheezing.  Cardiovascular:Positive for intermittent left lower rib pain (none at this time).Negative for leg swelling. Gastrointestinal:Positive forbaseline intermittentdiarrhea(controlled).Negative for abdominal pain, constipation, nausea and  vomiting. Genitourinary: Negative for bladder incontinence, difficulty urinating, dysuria, frequency and hematuria.  Musculoskeletal: Negative for back pain, gait problem, neck pain and neck stiffness.  Skin: Positive for nail darkening and falling off.  Negative for itching and rash.  Neurological:Negative for dizziness, headaches, extremity weakness, gait problem, light-headedness and seizures.  Hematological: Negative for adenopathy. Does not bruise/bleed easily.  Psychiatric/Behavioral: Negative for confusion, depression and sleep disturbance. The patient is not nervous/anxious.    PHYSICAL EXAMINATION:  Blood pressure 118/80, pulse 83, temperature 98 F (36.7 C), temperature source Tympanic, resp. rate 16, height 5' 1"  (1.549 m), weight 143 lb (64.9 kg), SpO2 98 %.  ECOG PERFORMANCE STATUS: 1 - Symptomatic but completely ambulatory  Physical Exam  Constitutional: Oriented to person, place, and time and well-developed, well-nourished, and in no distress.  HENT:  Head: Normocephalic and atraumatic.  Mouth/Throat: Oropharynx is clear and moist. No oropharyngeal exudate. Eyes: Conjunctivae are normal. Right eye exhibits no discharge. Left eye exhibits no discharge. No scleral icterus.  Neck: Normal range of motion. Neck supple.  Cardiovascular: Normal rate, regular rhythm, normal heart sounds and intact distal pulses.  Pulmonary/Chest: Effort normal and breath sounds normal. No respiratory distress. No wheezes. No rales.  Abdominal: Soft. Bowel sounds are normal. Exhibits no distension and no mass. There is no tenderness.  Musculoskeletal: Tenderness to palpation on the lateral left chest wall.Normal range of motion.  Exhibits no edema.  Lymphadenopathy:  No cervical adenopathy.  Neurological: Alert and oriented to person, place, and time. Exhibits normal muscle tone. Gait normal. Coordination normal.  Skin: Darkening and cracking of finger nails. Skin is warm and dry. No rash  noted. Not diaphoretic. No erythema. No pallor.  Psychiatric: Mood, memory and judgment normal.  Vitals reviewed.  LABORATORY DATA: Lab Results  Component Value Date   WBC 9.6 07/12/2020   HGB 9.4 (L) 07/12/2020   HCT 29.2 (L) 07/12/2020   MCV 103.2 (H) 07/12/2020   PLT 232 07/12/2020      Chemistry      Component Value Date/Time   NA 139 07/12/2020 1108   NA 139 07/21/2017 0958   K 3.4 (L) 07/12/2020 1108   CL 101 07/12/2020 1108   CO2 27 07/12/2020 1108   BUN 31 (H) 07/12/2020 1108   BUN 26 07/21/2017 0958   CREATININE 0.97 07/12/2020 1108   CREATININE 0.79 09/03/2013 1540      Component Value Date/Time   CALCIUM 9.4 07/12/2020 1108   ALKPHOS 53 07/12/2020 1108   AST 11 (L) 07/12/2020 1108   ALT <6 07/12/2020 1108   BILITOT 0.5 07/12/2020 1108       RADIOGRAPHIC STUDIES:  CT Chest W Contrast  Result Date: 07/09/2020 CLINICAL DATA:  Non-small cell lung cancer restaging, ongoing chemotherapy EXAM: CT CHEST, ABDOMEN, AND PELVIS WITH CONTRAST TECHNIQUE: Multidetector CT imaging of the chest, abdomen and pelvis was performed following the standard protocol during bolus administration of intravenous contrast. CONTRAST:  189m OMNIPAQUE IOHEXOL 300 MG/ML SOLN, additional oral enteric contrast COMPARISON:  CT chest, 05/08/2020, CT abdomen pelvis, 04/26/2020 FINDINGS: CT CHEST FINDINGS Cardiovascular: Right chest port catheter. Aortic atherosclerosis. Unchanged enlargement of the tubular ascending thoracic aorta, measuring up to 4.1 x 4.0 cm (series 2, image 25). Normal heart size. Left coronary artery calcifications. No pericardial effusion. Enlargement of the main pulmonary artery measuring up to 3.7 cm in caliber. Mediastinum/Nodes: Unchanged prominent mediastinal lymph nodes, largest pretracheal nodes measuring up to 1.4 x 0.8 cm (series 2, image 21). Thyroid gland, trachea, and esophagus demonstrate no significant findings. Lungs/Pleura: Moderate centrilobular and paraseptal  emphysema. Diffuse bilateral bronchial wall thickening. Bandlike scarring and volume loss of the left greater than right lung bases. Stable small pulmonary nodules of the right upper lobe, measuring 3 mm (series 4, image 40) and 2 mm (series 4, image 45). No pleural effusion or pneumothorax. Musculoskeletal: No chest wall mass or suspicious bone lesions identified. CT ABDOMEN PELVIS FINDINGS Hepatobiliary: No solid liver abnormality is seen. No gallstones, gallbladder wall thickening, or biliary dilatation. Pancreas: Unremarkable. No pancreatic ductal dilatation or surrounding inflammatory changes. Spleen: Normal in size without significant abnormality. Adrenals/Urinary Tract: Adrenal glands are unremarkable. Kidneys are normal, without renal calculi, solid lesion, or hydronephrosis. Bladder is unremarkable. Stomach/Bowel: Stomach is within normal limits. Appendix appears normal. No evidence of bowel wall thickening, distention, or inflammatory changes. Vascular/Lymphatic: Aortic atherosclerosis. 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 No enlarged abdominal or pelvic lymph nodes. Reproductive: Status post hysterectomy. Unchanged left ovarian cyst measuring up to 7.5 x 6.8 cm (series 2, image 87). Other: No abdominal wall hernia or abnormality. No abdominopelvic ascites. Musculoskeletal: No acute or significant osseous findings. IMPRESSION: 1. Stable small pulmonary nodules of the right upper lobe. 2. Unchanged prominent mediastinal lymph  nodes. 3. No evidence of metastatic disease in the abdomen or pelvis. 4. Findings are consistent with sustained treatment response. 5. Unchanged enlargement of the tubular ascending thoracic aorta, measuring up to 4.1 x 4.0 cm. 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 Recommend annual imaging followup by CTA or MRA. This recommendation follows 2010 ACCF/AHA/AATS/ACR/ASA/SCA/SCAI/SIR/STS/SVM Guidelines for the Diagnosis and Management of Patients with Thoracic Aortic Disease. Circulation. 2010; 121: V784-O962. Aortic aneurysm NOS (ICD10-I71.9) 6. Coronary artery disease. 7. Emphysema. 8. Enlargement of the main pulmonary artery, as can be seen in pulmonary hypertension. Aortic Atherosclerosis (ICD10-I70.0) and Emphysema (ICD10-J43.9). Electronically Signed   By: Eddie Candle M.D.   On: 07/09/2020 18:50   CT Abdomen Pelvis W Contrast  Result Date: 07/09/2020 CLINICAL DATA:  Non-small cell lung cancer restaging, ongoing chemotherapy EXAM: CT CHEST, ABDOMEN, AND PELVIS WITH CONTRAST TECHNIQUE: Multidetector CT imaging of the chest, abdomen and pelvis was performed following the standard protocol during bolus administration of intravenous contrast. CONTRAST:  135m OMNIPAQUE IOHEXOL 300 MG/ML SOLN, additional oral enteric contrast COMPARISON:  CT chest, 05/08/2020, CT abdomen pelvis, 04/26/2020 FINDINGS: CT CHEST FINDINGS Cardiovascular: Right chest port catheter. Aortic atherosclerosis. Unchanged enlargement of the tubular ascending thoracic aorta, measuring up to 4.1 x 4.0 cm (series 2, image 25). Normal heart size. Left coronary artery calcifications. No pericardial effusion. Enlargement of the main pulmonary artery measuring up to 3.7 cm in caliber. Mediastinum/Nodes: Unchanged prominent mediastinal lymph nodes, largest pretracheal nodes measuring up to 1.4 x 0.8 cm (series 2, image 21). Thyroid gland, trachea, and esophagus demonstrate no significant findings. Lungs/Pleura: Moderate centrilobular and paraseptal emphysema. Diffuse bilateral bronchial  wall thickening. Bandlike scarring and volume loss of the left greater than right lung bases. Stable small pulmonary nodules of the right upper lobe, measuring 3 mm (series 4, image 40) and 2 mm (series 4, image 45). No pleural effusion or pneumothorax. Musculoskeletal: No chest wall mass or suspicious bone lesions identified. CT ABDOMEN PELVIS FINDINGS Hepatobiliary: No solid liver abnormality is seen. No gallstones, gallbladder wall thickening, or biliary dilatation. Pancreas: Unremarkable. No pancreatic ductal dilatation or surrounding inflammatory changes. Spleen: Normal in size without significant abnormality. Adrenals/Urinary Tract: Adrenal glands are unremarkable. Kidneys are normal, without renal calculi, solid lesion, or hydronephrosis. Bladder is unremarkable. Stomach/Bowel: Stomach is within normal limits. Appendix appears normal. No evidence of bowel wall thickening, distention, or inflammatory changes. Vascular/Lymphatic: Aortic atherosclerosis. 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 No enlarged abdominal or pelvic lymph nodes. Reproductive: Status post hysterectomy. Unchanged left ovarian cyst measuring up to 7.5 x 6.8 cm (series 2, image 87). Other: No abdominal wall hernia or abnormality. No abdominopelvic ascites. Musculoskeletal: No acute or significant osseous findings. IMPRESSION: 1. Stable small pulmonary nodules of the right upper lobe. 2. Unchanged prominent mediastinal lymph nodes. 3. No evidence of metastatic disease in the abdomen or pelvis. 4. Findings are consistent with sustained treatment response. 5. Unchanged enlargement of the tubular ascending thoracic aorta, measuring up to 4.1 x 4.0 cm. 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 Recommend annual imaging followup by CTA or MRA. This recommendation follows 2010 ACCF/AHA/AATS/ACR/ASA/SCA/SCAI/SIR/STS/SVM Guidelines for the Diagnosis and Management of Patients with Thoracic Aortic Disease. Circulation. 2010; 121: A630-Z601. Aortic aneurysm NOS (ICD10-I71.9) 6. Coronary artery disease. 7. Emphysema. 8. Enlargement of the main pulmonary artery, as can be seen in pulmonary hypertension. Aortic Atherosclerosis (ICD10-I70.0) and Emphysema (ICD10-J43.9). Electronically Signed   By: Eddie Candle M.D.   On: 07/09/2020 18:50     ASSESSMENT/PLAN:  This is a very pleasant 75 year old African-American female diagnosed with stage IV non-small cell lung cancer, squamous cell carcinoma. She presented with a right upper lobe lung mass and pleural based metastases and mediastinal lymphadenopathy.She was diagnosed in August 2020   The patientwas onsystemicchemotherapy with carboplatin for an AUC of 5, paclitaxel 175 mg/m, and Keytruda 200 mg IV every 3 weeks with Neulasta support.Starting from cycle #5, shewas onmaintenance with single agent Keytruda.She is status post18cyclesof treatment and she tolerated it wellwithout any concerning adverse effectsexcept for mild fatigue.This was discontinued secondary to evidence of disease progression.  The patient is currently undergoing palliative systemic chemotherapy with docetaxel 75 mg per metered squared and Cyramza 10 mg/kg IV every 3 weeks with Neulasta support. She is status post9cycles and she is tolerating this well.She has some nail darkening and chipping of her nails.   The patient recently had a restaging CT scan performed.  Dr. Julien Nordmann personally independently reviewed the scan discussed the results with the patient today.  The scan showed  no evidence of disease progression.  Dr. Julien Nordmann recommends the patient proceed with cycle #10 today scheduled. However, he will reduce her dose of docetaxel to 65 mg/m2 due to peripheral neuropathy and darkening/chipping of the finger nails.   We will see the patient back for follow-up visit in 3 weeks for evaluation before starting cycle #11.  She is inquiring if she needs a refill of potassium or magnesium. No refill needed at this time. I will add on a magnesium to her labs next week. If this continues to be low, I will refill magnesium for her.   The patient was advised to call immediately if she has any concerning symptoms in the interval. The patient voices understanding of current disease status and treatment options and is in agreement with the current care plan. All questions were answered. The patient knows to call the clinic with any problems, questions or concerns. We can certainly see the patient much sooner if necessary        Orders Placed This Encounter  Procedures  . Magnesium    Standing Status:   Future    Standing Expiration Date:   07/12/2021     Debra Sos Mackinzie Vuncannon, PA-C 07/12/20  ADDENDUM: Hematology/Oncology Attending: I had a face-to-face encounter with the patient today.  I reviewed her record, scan and recommended her care plan.  This is a very pleasant 75 years old African-American female with stage IV non-small cell lung cancer, squamous cell carcinoma diagnosed in August 2020 status post induction systemic chemotherapy with carboplatin, paclitaxel and Keytruda followed by maintenance treatment with [facility] for a total of 18 cycles discontinued secondary to disease progression. The patient is currently undergoing second line systemic chemotherapy with docetaxel 75 Mg/M2 and Cyramza 10 mg/KG every 3 weeks status post 9 cycles.  She has been tolerating this treatment well except for the nail changes and darkening of her skin. She had repeat CT scan of  the chest, abdomen pelvis performed recently.  I  personally and independently reviewed the scan and discussed the results with the patient today. Her scan showed no concerning findings for disease progression. I recommended for the patient to continue her current treatment with docetaxel and Cyramza but I will reduce the dose of docetaxel to 65 Mg/M2 because of the nail changes and adverse effects. The patient will come back for follow-up visit in 3 weeks for evaluation before the next cycle of her treatment. She was advised to call immediately if she has any other concerning symptoms in the interval. The total time spent in the appointment was 36 minutes.  Disclaimer: This note was dictated with voice recognition software. Similar sounding words can inadvertently be transcribed and may be missed upon review. Eilleen Kempf, MD 07/12/20

## 2020-07-05 ENCOUNTER — Other Ambulatory Visit: Payer: Self-pay

## 2020-07-05 ENCOUNTER — Inpatient Hospital Stay: Payer: Medicare HMO

## 2020-07-05 DIAGNOSIS — C3491 Malignant neoplasm of unspecified part of right bronchus or lung: Secondary | ICD-10-CM

## 2020-07-05 DIAGNOSIS — Z5111 Encounter for antineoplastic chemotherapy: Secondary | ICD-10-CM | POA: Diagnosis not present

## 2020-07-05 LAB — CMP (CANCER CENTER ONLY)
ALT: <6 U/L (ref 0–44)
AST: 13 U/L — ABNORMAL LOW (ref 15–41)
Albumin: 3.6 g/dL (ref 3.5–5.0)
Alkaline Phosphatase: 71 U/L (ref 38–126)
Anion gap: 11 (ref 5–15)
BUN: 16 mg/dL (ref 8–23)
CO2: 28 mmol/L (ref 22–32)
Calcium: 9.2 mg/dL (ref 8.9–10.3)
Chloride: 103 mmol/L (ref 98–111)
Creatinine: 0.85 mg/dL (ref 0.44–1.00)
GFR, Estimated: >60 mL/min (ref >60)
Glucose, Bld: 85 mg/dL (ref 70–99)
Potassium: 3.3 mmol/L — ABNORMAL LOW (ref 3.5–5.1)
Sodium: 142 mmol/L (ref 135–145)
Total Bilirubin: 0.6 mg/dL (ref 0.3–1.2)
Total Protein: 6.6 g/dL (ref 6.5–8.1)

## 2020-07-05 LAB — CBC WITH DIFFERENTIAL (CANCER CENTER ONLY)
Abs Immature Granulocytes: 0.11 10*3/uL — ABNORMAL HIGH (ref 0.00–0.07)
Basophils Absolute: 0 10*3/uL (ref 0.0–0.1)
Basophils Relative: 0 %
Eosinophils Absolute: 0 10*3/uL (ref 0.0–0.5)
Eosinophils Relative: 0 %
HCT: 30.9 % — ABNORMAL LOW (ref 36.0–46.0)
Hemoglobin: 9.9 g/dL — ABNORMAL LOW (ref 12.0–15.0)
Immature Granulocytes: 1 %
Lymphocytes Relative: 28 %
Lymphs Abs: 2.1 10*3/uL (ref 0.7–4.0)
MCH: 32.8 pg (ref 26.0–34.0)
MCHC: 32 g/dL (ref 30.0–36.0)
MCV: 102.3 fL — ABNORMAL HIGH (ref 80.0–100.0)
Monocytes Absolute: 0.4 10*3/uL (ref 0.1–1.0)
Monocytes Relative: 6 %
Neutro Abs: 5 10*3/uL (ref 1.7–7.7)
Neutrophils Relative %: 65 %
Platelet Count: 151 10*3/uL (ref 150–400)
RBC: 3.02 MIL/uL — ABNORMAL LOW (ref 3.87–5.11)
RDW: 17.2 % — ABNORMAL HIGH (ref 11.5–15.5)
WBC Count: 7.7 10*3/uL (ref 4.0–10.5)
nRBC: 0.3 % — ABNORMAL HIGH (ref 0.0–0.2)

## 2020-07-05 LAB — TOTAL PROTEIN, URINE DIPSTICK

## 2020-07-07 ENCOUNTER — Ambulatory Visit (HOSPITAL_COMMUNITY)
Admission: RE | Admit: 2020-07-07 | Discharge: 2020-07-07 | Disposition: A | Discharge status: Home / Self Care | Payer: Medicare HMO | Source: Ambulatory Visit | Attending: Internal Medicine | Admitting: Internal Medicine

## 2020-07-07 ENCOUNTER — Other Ambulatory Visit: Payer: Self-pay

## 2020-07-07 DIAGNOSIS — C3490 Malignant neoplasm of unspecified part of unspecified bronchus or lung: Secondary | ICD-10-CM | POA: Insufficient documentation

## 2020-07-07 MED ORDER — IOHEXOL 9 MG/ML PO SOLN: 500.0000 mL | ORAL | Status: AC

## 2020-07-07 MED ORDER — IOHEXOL 9 MG/ML PO SOLN
ORAL | Status: AC
  Administered 2020-07-07: 1000 mL via ORAL
  Filled 2020-07-07: qty 1000

## 2020-07-07 MED ORDER — HEPARIN SOD (PORK) LOCK FLUSH 100 UNIT/ML IV SOLN
INTRAVENOUS | Status: AC
  Administered 2020-07-07: 500 [IU] via INTRAVENOUS
  Filled 2020-07-07: qty 5

## 2020-07-07 MED ORDER — IOHEXOL 300 MG/ML  SOLN
100.0000 mL | Freq: Once | INTRAMUSCULAR | Status: AC | PRN
  Administered 2020-07-07: 100 mL via INTRAVENOUS

## 2020-07-07 MED ORDER — HEPARIN SOD (PORK) LOCK FLUSH 100 UNIT/ML IV SOLN: 500.0000 [IU] | Freq: Once | INTRAVENOUS | Status: AC

## 2020-07-10 ENCOUNTER — Encounter: Payer: Self-pay | Admitting: Student in an Organized Health Care Education/Training Program

## 2020-07-10 ENCOUNTER — Ambulatory Visit (INDEPENDENT_AMBULATORY_CARE_PROVIDER_SITE_OTHER): Payer: Medicare HMO | Admitting: Student in an Organized Health Care Education/Training Program

## 2020-07-10 DIAGNOSIS — I8290 Acute embolism and thrombosis of unspecified vein: Secondary | ICD-10-CM

## 2020-07-10 DIAGNOSIS — I10 Essential (primary) hypertension: Secondary | ICD-10-CM | POA: Diagnosis not present

## 2020-07-10 DIAGNOSIS — C3491 Malignant neoplasm of unspecified part of right bronchus or lung: Secondary | ICD-10-CM | POA: Diagnosis not present

## 2020-07-10 DIAGNOSIS — J439 Emphysema, unspecified: Secondary | ICD-10-CM | POA: Diagnosis not present

## 2020-07-10 MED ORDER — SPIRIVA HANDIHALER 18 MCG IN CAPS: 18.0000 ug | ORAL_CAPSULE | Freq: Every day | RESPIRATORY_TRACT | 2 refills | Status: DC

## 2020-07-10 NOTE — Assessment & Plan Note (Signed)
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  Patient discontinued rivaroxaban due to cost.  Scan yesterday showed no further clot burden in the right internal jugular vein.  Seems okay to continue without the medication for now especially given difficulty with access and palliative chemo treatment.

## 2020-07-10 NOTE — Assessment & Plan Note (Signed)
Currently being managed by Dr. Earlie Server, on a 12 cycle course of chemotherapy which is palliative that she seems to be tolerating okay.  Functional status acceptable at home.  Seems to have good family support.  Seems to have reasonable expectations.

## 2020-07-10 NOTE — Assessment & Plan Note (Signed)
Symptomatic COPD, using albuterol inhaler 2-3 times daily for a few weeks.  In the past I tried to prescribe Stiolto but she could not afford the co-pays.  I am not sure why it was so unaffordable, according to the preferred drug list it is covered as tier 3.  Perhaps the regular co-pays for [facility] are not affordable?  I did the patient a 28-day sample of Spiriva in our office, we went over inhaler technique in the office, and I will prescribe refills to her pharmacy.  It is also tier 3 so there may be issues in the future with access.

## 2020-07-10 NOTE — Assessment & Plan Note (Signed)
Blood pressure well controlled right now.  Patient is at risk for hypotensive events due to ongoing chemotherapy.  For now okay to continue with chlorthalidone and amlodipine, however if she has any further dehydration or hypotension would discontinue these medications.  Okay to have higher goal for her blood pressure given limited life expectancy.

## 2020-07-10 NOTE — Progress Notes (Signed)
   Assessment and Plan:  See Encounters tab for problem-based medical decision making.   __________________________________________________________  HPI:   75 year old person living with stage IV squamous cell carcinoma of the lung here for follow-up of hypertension and COPD.  Been about a year since I last saw her, she is doing well.  Good functional status at home, independent in activities of daily living.  Currently has undergone 10 cycles of a palliative chemotherapy regimen, to 4 cycles to go.  Recent CT for restaging looks promising, her cancer seems to be fairly well controlled, though it is not curable.  Spends about 2 days a week in bed, other days she has good functional capacity.  Reports good adherence with most of her medications, some of them she has not been able to access due to cost likely Xarelto and Stiolto.  Has not had much nausea or vomiting recently.  No dizziness when standing.  __________________________________________________________  Problem List: Patient Active Problem List   Diagnosis Date Noted  . Stage IV squamous cell carcinoma of right lung ([place]) 12/02/2018    Priority: High  . Abdominal aortic aneurysm (AAA) 35 to 39 mm in diameter ([place]) 11/05/2018    Priority: High  . COPD (chronic obstructive pulmonary disease) ([place]) 09/23/2016    Priority: High  . Essential hypertension 04/17/2015    Priority: High  . Major depressive disorder, recurrent episode ([place]) 04/17/2015    Priority: High  . Cancer associated pain 12/30/2018    Priority: Medium  . Glucose intolerance 10/02/2015    Priority: Medium  . Adenomatous colon polyp 10/02/2015    Priority: Medium  . Osteoarthritis cervical spine 05/26/2013    Priority: Medium  . GERD 06/25/2007    Priority: Medium  . Goals of care, counseling/discussion 12/02/2018    Priority: Low  . BPPV (benign paroxysmal positional vertigo) 12/02/2016    Priority: Low  . OSA (obstructive sleep apnea) 10/02/2015     Priority: Low  . Hyperlipidemia 07/29/2006    Priority: Low  . Hypomagnesemia 01/13/2020  . DVT (deep venous thrombosis) ([place]) 07/12/2019  . Port-A-Cath in place 07/07/2019  . Hypokalemia 12/16/2018  . Encounter for antineoplastic chemotherapy 12/02/2018  . Encounter for antineoplastic immunotherapy 12/02/2018    Medications: Reconciled today in Epic __________________________________________________________  Physical Exam:  Vital Signs: Vitals:   07/10/20 1006  BP: 109/68  Pulse: 79  Temp: 98.2 F (36.8 C)  TempSrc: Oral  SpO2: 100%  Weight: 140 lb 14.4 oz (63.9 kg)  Height: 5' 1"  (1.549 m)    Gen: Chronically ill-appearing woman, no distress ENT: OP clear without erythema or exudate.  Neck: No cervical LAD, No thyromegaly or nodules, No JVD. 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

## 2020-07-11 ENCOUNTER — Encounter: Payer: Self-pay | Admitting: *Deleted

## 2020-07-11 NOTE — Progress Notes (Unsigned)

## 2020-07-12 ENCOUNTER — Other Ambulatory Visit: Payer: Self-pay

## 2020-07-12 ENCOUNTER — Inpatient Hospital Stay: Payer: Medicare HMO

## 2020-07-12 ENCOUNTER — Telehealth: Payer: Self-pay | Admitting: Medical Oncology

## 2020-07-12 ENCOUNTER — Inpatient Hospital Stay: Payer: Medicare HMO | Admitting: Physician Assistant

## 2020-07-12 VITALS — BP 118/80 | HR 83 | Temp 98.0°F | Resp 16 | Ht 61.0 in | Wt 143.0 lb

## 2020-07-12 DIAGNOSIS — C3491 Malignant neoplasm of unspecified part of right bronchus or lung: Secondary | ICD-10-CM | POA: Diagnosis not present

## 2020-07-12 DIAGNOSIS — Z95828 Presence of other vascular implants and grafts: Secondary | ICD-10-CM

## 2020-07-12 DIAGNOSIS — Z5111 Encounter for antineoplastic chemotherapy: Secondary | ICD-10-CM

## 2020-07-12 LAB — CMP (CANCER CENTER ONLY)
ALT: <6 U/L (ref 0–44)
AST: 11 U/L — ABNORMAL LOW (ref 15–41)
Albumin: 3.6 g/dL (ref 3.5–5.0)
Alkaline Phosphatase: 53 U/L (ref 38–126)
Anion gap: 11 (ref 5–15)
BUN: 31 mg/dL — ABNORMAL HIGH (ref 8–23)
CO2: 27 mmol/L (ref 22–32)
Calcium: 9.4 mg/dL (ref 8.9–10.3)
Chloride: 101 mmol/L (ref 98–111)
Creatinine: 0.97 mg/dL (ref 0.44–1.00)
GFR, Estimated: >60 mL/min (ref >60)
Glucose, Bld: 178 mg/dL — ABNORMAL HIGH (ref 70–99)
Potassium: 3.4 mmol/L — ABNORMAL LOW (ref 3.5–5.1)
Sodium: 139 mmol/L (ref 135–145)
Total Bilirubin: 0.5 mg/dL (ref 0.3–1.2)
Total Protein: 6.5 g/dL (ref 6.5–8.1)

## 2020-07-12 LAB — CBC WITH DIFFERENTIAL (CANCER CENTER ONLY)
Abs Immature Granulocytes: 0.07 10*3/uL (ref 0.00–0.07)
Basophils Absolute: 0 10*3/uL (ref 0.0–0.1)
Basophils Relative: 0 %
Eosinophils Absolute: 0 10*3/uL (ref 0.0–0.5)
Eosinophils Relative: 0 %
HCT: 29.2 % — ABNORMAL LOW (ref 36.0–46.0)
Hemoglobin: 9.4 g/dL — ABNORMAL LOW (ref 12.0–15.0)
Immature Granulocytes: 1 %
Lymphocytes Relative: 10 %
Lymphs Abs: 1 10*3/uL (ref 0.7–4.0)
MCH: 33.2 pg (ref 26.0–34.0)
MCHC: 32.2 g/dL (ref 30.0–36.0)
MCV: 103.2 fL — ABNORMAL HIGH (ref 80.0–100.0)
Monocytes Absolute: 0.2 10*3/uL (ref 0.1–1.0)
Monocytes Relative: 2 %
Neutro Abs: 8.4 10*3/uL — ABNORMAL HIGH (ref 1.7–7.7)
Neutrophils Relative %: 87 %
Platelet Count: 232 10*3/uL (ref 150–400)
RBC: 2.83 MIL/uL — ABNORMAL LOW (ref 3.87–5.11)
RDW: 17.1 % — ABNORMAL HIGH (ref 11.5–15.5)
WBC Count: 9.6 10*3/uL (ref 4.0–10.5)
nRBC: 0 % (ref 0.0–0.2)

## 2020-07-12 LAB — TSH: TSH: 0.869 u[IU]/mL (ref 0.308–3.960)

## 2020-07-12 LAB — TOTAL PROTEIN, URINE DIPSTICK: Protein, ur: NEGATIVE mg/dL

## 2020-07-12 MED ORDER — DIPHENHYDRAMINE HCL 50 MG/ML IJ SOLN
50.0000 mg | Freq: Once | INTRAMUSCULAR | Status: AC
  Administered 2020-07-12: 50 mg via INTRAVENOUS

## 2020-07-12 MED ORDER — SODIUM CHLORIDE 0.9 % IV SOLN
65.0000 mg/m2 | Freq: Once | INTRAVENOUS | Status: AC
  Administered 2020-07-12: 110 mg via INTRAVENOUS
  Filled 2020-07-12: qty 11

## 2020-07-12 MED ORDER — SODIUM CHLORIDE 0.9% FLUSH
10.0000 mL | INTRAVENOUS | Status: DC | PRN
  Administered 2020-07-12: 10 mL
  Filled 2020-07-12: qty 10

## 2020-07-12 MED ORDER — SODIUM CHLORIDE 0.9 % IV SOLN
10.0000 mg/kg | Freq: Once | INTRAVENOUS | Status: AC
  Administered 2020-07-12: 600 mg via INTRAVENOUS
  Filled 2020-07-12: qty 50

## 2020-07-12 MED ORDER — ACETAMINOPHEN 325 MG PO TABS
650.0000 mg | ORAL_TABLET | Freq: Once | ORAL | Status: AC
  Administered 2020-07-12: 650 mg via ORAL

## 2020-07-12 MED ORDER — DIPHENHYDRAMINE HCL 50 MG/ML IJ SOLN
INTRAMUSCULAR | Status: AC
  Filled 2020-07-12: qty 1

## 2020-07-12 MED ORDER — ACETAMINOPHEN 325 MG PO TABS
ORAL_TABLET | ORAL | Status: AC
  Filled 2020-07-12: qty 2

## 2020-07-12 MED ORDER — SODIUM CHLORIDE 0.9 % IV SOLN
10.0000 mg | Freq: Once | INTRAVENOUS | Status: AC
  Administered 2020-07-12: 10 mg via INTRAVENOUS
  Filled 2020-07-12: qty 10

## 2020-07-12 MED ORDER — HEPARIN SOD (PORK) LOCK FLUSH 100 UNIT/ML IV SOLN
500.0000 [IU] | Freq: Once | INTRAVENOUS | Status: AC | PRN
  Administered 2020-07-12: 500 [IU]
  Filled 2020-07-12: qty 5

## 2020-07-12 MED ORDER — SODIUM CHLORIDE 0.9 % IV SOLN
Freq: Once | INTRAVENOUS | Status: AC
  Filled 2020-07-12: qty 250

## 2020-07-12 NOTE — Telephone Encounter (Signed)
I LVM to return my call . I am not sure what she is asked for on her VM.

## 2020-07-12 NOTE — Patient Instructions (Signed)
[facility] Discharge Instructions for Patients Receiving Chemotherapy  Today you received the following chemotherapy agents ramacirumab, docetaxel  To help prevent nausea and vomiting after your treatment, we encourage you to take your nausea medication as directed.   If you develop nausea and vomiting that is not controlled by your nausea medication, call the clinic.   BELOW ARE SYMPTOMS THAT SHOULD BE REPORTED IMMEDIATELY:  *FEVER GREATER THAN 100.5 F  *CHILLS WITH OR WITHOUT FEVER  NAUSEA AND VOMITING THAT IS NOT CONTROLLED WITH YOUR NAUSEA MEDICATION  *UNUSUAL SHORTNESS OF BREATH  *UNUSUAL BRUISING OR BLEEDING  TENDERNESS IN MOUTH AND THROAT WITH OR WITHOUT PRESENCE OF ULCERS  *URINARY PROBLEMS  *BOWEL PROBLEMS  UNUSUAL RASH Items with * indicate a potential emergency and should be followed up as soon as possible.  Feel free to call the clinic should you have any questions or concerns. The clinic phone number is [phone].  Please show the [facility] at check-in to the Emergency Department and triage nurse.

## 2020-07-13 NOTE — Progress Notes (Signed)
Would not recommend an AWV for this person at this time. She has stage 4 lung cancer currently on palliative chemotherapy.

## 2020-07-14 ENCOUNTER — Inpatient Hospital Stay: Payer: Medicare HMO

## 2020-07-14 ENCOUNTER — Other Ambulatory Visit: Payer: Self-pay

## 2020-07-14 VITALS — BP 112/76 | HR 73 | Temp 99.3°F | Resp 16

## 2020-07-14 DIAGNOSIS — C3491 Malignant neoplasm of unspecified part of right bronchus or lung: Secondary | ICD-10-CM

## 2020-07-14 DIAGNOSIS — Z5111 Encounter for antineoplastic chemotherapy: Secondary | ICD-10-CM | POA: Diagnosis not present

## 2020-07-14 MED ORDER — PEGFILGRASTIM-JMDB 6 MG/0.6ML ~~LOC~~ SOSY
PREFILLED_SYRINGE | SUBCUTANEOUS | Status: AC
  Filled 2020-07-14: qty 0.6

## 2020-07-14 MED ORDER — PEGFILGRASTIM-JMDB 6 MG/0.6ML ~~LOC~~ SOSY
6.0000 mg | PREFILLED_SYRINGE | Freq: Once | SUBCUTANEOUS | Status: AC
  Administered 2020-07-14: 6 mg via SUBCUTANEOUS

## 2020-07-14 NOTE — Patient Instructions (Signed)
Pegfilgrastim injection What is this medicine? PEGFILGRASTIM (PEG fil gra stim) is a long-acting granulocyte colony-stimulating factor that stimulates the growth of neutrophils, a type of white blood cell important in the body's fight against infection. It is used to reduce the incidence of fever and infection in patients with certain types of cancer who are receiving chemotherapy that affects the bone marrow, and to increase survival after being exposed to high doses of radiation. This medicine may be used for other purposes; ask your health care provider or pharmacist if you have questions. COMMON BRAND NAME(S): Rexene Edison, Ziextenzo What should I tell my health care provider before I take this medicine? They need to know if you have any of these conditions:  kidney disease  latex allergy  ongoing radiation therapy  sickle cell disease  skin reactions to acrylic adhesives (On-Body Injector only)  an unusual or allergic reaction to pegfilgrastim, filgrastim, other medicines, foods, dyes, or preservatives  pregnant or trying to get pregnant  breast-feeding How should I use this medicine? This medicine is for injection under the skin. If you get this medicine at home, you will be taught how to prepare and give the pre-filled syringe or how to use the On-body Injector. Refer to the patient Instructions for Use for detailed instructions. Use exactly as directed. Tell your healthcare provider immediately if you suspect that the On-body Injector may not have performed as intended or if you suspect the use of the On-body Injector resulted in a missed or partial dose. It is important that you put your used needles and syringes in a special sharps container. Do not put them in a trash can. If you do not have a sharps container, call your pharmacist or healthcare provider to get one. Talk to your pediatrician regarding the use of this medicine in children. While this drug  may be prescribed for selected conditions, precautions do apply. Overdosage: If you think you have taken too much of this medicine contact a poison control center or emergency room at once. NOTE: This medicine is only for you. Do not share this medicine with others. What if I miss a dose? It is important not to miss your dose. Call your doctor or health care professional if you miss your dose. If you miss a dose due to an On-body Injector failure or leakage, a new dose should be administered as soon as possible using a single prefilled syringe for manual use. What may interact with this medicine? Interactions have not been studied. This list may not describe all possible interactions. Give your health care provider a list of all the medicines, herbs, non-prescription drugs, or dietary supplements you use. Also tell them if you smoke, drink alcohol, or use illegal drugs. Some items may interact with your medicine. What should I watch for while using this medicine? Your condition will be monitored carefully while you are receiving this medicine. You may need blood work done while you are taking this medicine. Talk to your health care provider about your risk of cancer. You may be more at risk for certain types of cancer if you take this medicine. If you are going to need a MRI, CT scan, or other procedure, tell your doctor that you are using this medicine (On-Body Injector only). What side effects may I notice from receiving this medicine? Side effects that you should report to your doctor or health care professional as soon as possible:  allergic reactions (skin rash, itching or hives, swelling of  the face, lips, or tongue)  back pain  dizziness  fever  pain, redness, or irritation at site where injected  pinpoint red spots on the skin  red or dark-brown urine  shortness of breath or breathing problems  stomach or side pain, or pain at the shoulder  swelling  tiredness  trouble  passing urine or change in the amount of urine  unusual bruising or bleeding Side effects that usually do not require medical attention (report to your doctor or health care professional if they continue or are bothersome):  bone pain  muscle pain This list may not describe all possible side effects. Call your doctor for medical advice about side effects. You may report side effects to FDA at 1-800-FDA-1088. Where should I keep my medicine? Keep out of the reach of children. If you are using this medicine at home, you will be instructed on how to store it. Throw away any unused medicine after the expiration date on the label. NOTE: This sheet is a summary. It may not cover all possible information. If you have questions about this medicine, talk to your doctor, pharmacist, or health care provider.  2021 Elsevier/Gold Standard (2019-04-02 13:20:51)

## 2020-07-19 ENCOUNTER — Other Ambulatory Visit: Payer: Self-pay | Admitting: Physician Assistant

## 2020-07-19 ENCOUNTER — Inpatient Hospital Stay: Payer: Medicare HMO

## 2020-07-19 ENCOUNTER — Telehealth: Payer: Self-pay | Admitting: Physician Assistant

## 2020-07-19 ENCOUNTER — Other Ambulatory Visit: Payer: Medicare HMO

## 2020-07-19 ENCOUNTER — Other Ambulatory Visit: Payer: Self-pay

## 2020-07-19 DIAGNOSIS — C3491 Malignant neoplasm of unspecified part of right bronchus or lung: Secondary | ICD-10-CM

## 2020-07-19 DIAGNOSIS — Z5111 Encounter for antineoplastic chemotherapy: Secondary | ICD-10-CM | POA: Diagnosis not present

## 2020-07-19 DIAGNOSIS — Z95828 Presence of other vascular implants and grafts: Secondary | ICD-10-CM

## 2020-07-19 LAB — CBC WITH DIFFERENTIAL (CANCER CENTER ONLY)
Abs Immature Granulocytes: 0.06 10*3/uL (ref 0.00–0.07)
Basophils Absolute: 0.1 10*3/uL (ref 0.0–0.1)
Basophils Relative: 2 %
Eosinophils Absolute: 0 10*3/uL (ref 0.0–0.5)
Eosinophils Relative: 1 %
HCT: 29.3 % — ABNORMAL LOW (ref 36.0–46.0)
Hemoglobin: 9.7 g/dL — ABNORMAL LOW (ref 12.0–15.0)
Immature Granulocytes: 1 %
Lymphocytes Relative: 35 %
Lymphs Abs: 1.5 10*3/uL (ref 0.7–4.0)
MCH: 33.2 pg (ref 26.0–34.0)
MCHC: 33.1 g/dL (ref 30.0–36.0)
MCV: 100.3 fL — ABNORMAL HIGH (ref 80.0–100.0)
Monocytes Absolute: 0.5 10*3/uL (ref 0.1–1.0)
Monocytes Relative: 12 %
Neutro Abs: 2.1 10*3/uL (ref 1.7–7.7)
Neutrophils Relative %: 49 %
Platelet Count: 132 10*3/uL — ABNORMAL LOW (ref 150–400)
RBC: 2.92 MIL/uL — ABNORMAL LOW (ref 3.87–5.11)
RDW: 16.8 % — ABNORMAL HIGH (ref 11.5–15.5)
WBC Count: 4.3 10*3/uL (ref 4.0–10.5)
nRBC: 0 % (ref 0.0–0.2)

## 2020-07-19 LAB — CMP (CANCER CENTER ONLY)
ALT: <6 U/L (ref 0–44)
AST: 13 U/L — ABNORMAL LOW (ref 15–41)
Albumin: 3.5 g/dL (ref 3.5–5.0)
Alkaline Phosphatase: 63 U/L (ref 38–126)
Anion gap: 11 (ref 5–15)
BUN: 22 mg/dL (ref 8–23)
CO2: 29 mmol/L (ref 22–32)
Calcium: 9.4 mg/dL (ref 8.9–10.3)
Chloride: 100 mmol/L (ref 98–111)
Creatinine: 0.85 mg/dL (ref 0.44–1.00)
GFR, Estimated: >60 mL/min (ref >60)
Glucose, Bld: 83 mg/dL (ref 70–99)
Potassium: 3.4 mmol/L — ABNORMAL LOW (ref 3.5–5.1)
Sodium: 140 mmol/L (ref 135–145)
Total Bilirubin: 0.9 mg/dL (ref 0.3–1.2)
Total Protein: 6.3 g/dL — ABNORMAL LOW (ref 6.5–8.1)

## 2020-07-19 LAB — MAGNESIUM: Magnesium: 1.6 mg/dL — ABNORMAL LOW (ref 1.7–2.4)

## 2020-07-19 MED ORDER — HEPARIN SOD (PORK) LOCK FLUSH 100 UNIT/ML IV SOLN
500.0000 [IU] | Freq: Once | INTRAVENOUS | Status: AC | PRN
  Administered 2020-07-19: 500 [IU]
  Filled 2020-07-19: qty 5

## 2020-07-19 MED ORDER — SODIUM CHLORIDE 0.9% FLUSH
10.0000 mL | INTRAVENOUS | Status: DC | PRN
  Administered 2020-07-19: 10 mL
  Filled 2020-07-19: qty 10

## 2020-07-19 MED ORDER — MAGNESIUM OXIDE 400 MG PO TABS: 1.0000 | ORAL_TABLET | Freq: Every day | ORAL | 1 refills | Status: DC

## 2020-07-19 NOTE — Telephone Encounter (Signed)
I called the patient to let her know I will refill her magnesium because it is still low.

## 2020-07-21 ENCOUNTER — Telehealth: Payer: Self-pay

## 2020-07-21 NOTE — Telephone Encounter (Signed)
Pt called stating she has a brown and red spot on the whites of her eyes. She denies pain, swelling and fever at this time and states she isn't too concerned because it could be her allergy to pollen but wants to have this documented in the event it changes. I advised the pt to definitely call us back should her sx worsen in anyway and her allergy medication does not help her. Pt was agreeable to this and expressed understanding of this information.

## 2020-07-26 ENCOUNTER — Other Ambulatory Visit: Payer: Self-pay

## 2020-07-26 ENCOUNTER — Inpatient Hospital Stay: Payer: Medicare HMO | Attending: Physician Assistant

## 2020-07-26 ENCOUNTER — Other Ambulatory Visit: Payer: Medicare HMO

## 2020-07-26 DIAGNOSIS — C782 Secondary malignant neoplasm of pleura: Secondary | ICD-10-CM | POA: Diagnosis not present

## 2020-07-26 DIAGNOSIS — Z5111 Encounter for antineoplastic chemotherapy: Secondary | ICD-10-CM | POA: Diagnosis not present

## 2020-07-26 DIAGNOSIS — I714 Abdominal aortic aneurysm, without rupture: Secondary | ICD-10-CM | POA: Diagnosis not present

## 2020-07-26 DIAGNOSIS — J439 Emphysema, unspecified: Secondary | ICD-10-CM | POA: Diagnosis not present

## 2020-07-26 DIAGNOSIS — I700 Atherosclerosis of aorta: Secondary | ICD-10-CM | POA: Insufficient documentation

## 2020-07-26 DIAGNOSIS — C3411 Malignant neoplasm of upper lobe, right bronchus or lung: Secondary | ICD-10-CM | POA: Diagnosis not present

## 2020-07-26 DIAGNOSIS — G629 Polyneuropathy, unspecified: Secondary | ICD-10-CM | POA: Diagnosis not present

## 2020-07-26 DIAGNOSIS — I2510 Atherosclerotic heart disease of native coronary artery without angina pectoris: Secondary | ICD-10-CM | POA: Insufficient documentation

## 2020-07-26 DIAGNOSIS — I10 Essential (primary) hypertension: Secondary | ICD-10-CM | POA: Diagnosis not present

## 2020-07-26 DIAGNOSIS — C3491 Malignant neoplasm of unspecified part of right bronchus or lung: Secondary | ICD-10-CM

## 2020-07-26 DIAGNOSIS — F329 Major depressive disorder, single episode, unspecified: Secondary | ICD-10-CM | POA: Diagnosis not present

## 2020-07-26 DIAGNOSIS — Z5189 Encounter for other specified aftercare: Secondary | ICD-10-CM | POA: Diagnosis not present

## 2020-07-26 DIAGNOSIS — J449 Chronic obstructive pulmonary disease, unspecified: Secondary | ICD-10-CM | POA: Diagnosis not present

## 2020-07-26 DIAGNOSIS — G4730 Sleep apnea, unspecified: Secondary | ICD-10-CM | POA: Diagnosis not present

## 2020-07-26 DIAGNOSIS — Z79899 Other long term (current) drug therapy: Secondary | ICD-10-CM | POA: Diagnosis not present

## 2020-07-26 DIAGNOSIS — R5383 Other fatigue: Secondary | ICD-10-CM | POA: Diagnosis not present

## 2020-07-26 DIAGNOSIS — Z95828 Presence of other vascular implants and grafts: Secondary | ICD-10-CM

## 2020-07-26 DIAGNOSIS — F17210 Nicotine dependence, cigarettes, uncomplicated: Secondary | ICD-10-CM | POA: Diagnosis not present

## 2020-07-26 LAB — CMP (CANCER CENTER ONLY)
ALT: <6 U/L (ref 0–44)
AST: 15 U/L (ref 15–41)
Albumin: 3.7 g/dL (ref 3.5–5.0)
Alkaline Phosphatase: 82 U/L (ref 38–126)
Anion gap: 10 (ref 5–15)
BUN: 23 mg/dL (ref 8–23)
CO2: 29 mmol/L (ref 22–32)
Calcium: 9.2 mg/dL (ref 8.9–10.3)
Chloride: 102 mmol/L (ref 98–111)
Creatinine: 1.06 mg/dL — ABNORMAL HIGH (ref 0.44–1.00)
GFR, Estimated: 55 mL/min — ABNORMAL LOW (ref >60)
Glucose, Bld: 88 mg/dL (ref 70–99)
Potassium: 3.3 mmol/L — ABNORMAL LOW (ref 3.5–5.1)
Sodium: 141 mmol/L (ref 135–145)
Total Bilirubin: 0.7 mg/dL (ref 0.3–1.2)
Total Protein: 6.7 g/dL (ref 6.5–8.1)

## 2020-07-26 LAB — CBC WITH DIFFERENTIAL (CANCER CENTER ONLY)
Abs Immature Granulocytes: 0.06 10*3/uL (ref 0.00–0.07)
Basophils Absolute: 0 10*3/uL (ref 0.0–0.1)
Basophils Relative: 0 %
Eosinophils Absolute: 0 10*3/uL (ref 0.0–0.5)
Eosinophils Relative: 0 %
HCT: 31.3 % — ABNORMAL LOW (ref 36.0–46.0)
Hemoglobin: 10.2 g/dL — ABNORMAL LOW (ref 12.0–15.0)
Immature Granulocytes: 1 %
Lymphocytes Relative: 21 %
Lymphs Abs: 2.2 10*3/uL (ref 0.7–4.0)
MCH: 33.1 pg (ref 26.0–34.0)
MCHC: 32.6 g/dL (ref 30.0–36.0)
MCV: 101.6 fL — ABNORMAL HIGH (ref 80.0–100.0)
Monocytes Absolute: 0.5 10*3/uL (ref 0.1–1.0)
Monocytes Relative: 5 %
Neutro Abs: 7.7 10*3/uL (ref 1.7–7.7)
Neutrophils Relative %: 73 %
Platelet Count: 150 10*3/uL (ref 150–400)
RBC: 3.08 MIL/uL — ABNORMAL LOW (ref 3.87–5.11)
RDW: 17.2 % — ABNORMAL HIGH (ref 11.5–15.5)
WBC Count: 10.5 10*3/uL (ref 4.0–10.5)
nRBC: 0.2 % (ref 0.0–0.2)

## 2020-07-26 MED ORDER — SODIUM CHLORIDE 0.9% FLUSH
10.0000 mL | INTRAVENOUS | Status: DC | PRN
  Administered 2020-07-26: 10 mL
  Filled 2020-07-26: qty 10

## 2020-07-26 MED ORDER — HEPARIN SOD (PORK) LOCK FLUSH 100 UNIT/ML IV SOLN
500.0000 [IU] | Freq: Once | INTRAVENOUS | Status: AC | PRN
  Administered 2020-07-26: 500 [IU]
  Filled 2020-07-26: qty 5

## 2020-07-26 NOTE — Patient Instructions (Signed)
Implanted Port Insertion, Care After This sheet gives you information about how to care for yourself after your procedure. Your health care provider may also give you more specific instructions. If you have problems or questions, contact your health care provider. What can I expect after the procedure? After the procedure, it is common to have:  Discomfort at the port insertion site.  Bruising on the skin over the port. This should improve over 3-4 days. Follow these instructions at home: Port care  After your port is placed, you will get a manufacturer's information card. The card has information about your port. Keep this card with you at all times.  Take care of the port as told by your health care provider. Ask your health care provider if you or a family member can get training for taking care of the port at home. A home health care nurse may also take care of the port.  Make sure to remember what type of port you have. Incision care  Follow instructions from your health care provider about how to take care of your port insertion site. Make sure you: ? Wash your hands with soap and water before and after you change your bandage (dressing). If soap and water are not available, use hand sanitizer. ? Change your dressing as told by your health care provider. ? Leave stitches (sutures), skin glue, or adhesive strips in place. These skin closures may need to stay in place for 2 weeks or longer. If adhesive strip edges start to loosen and curl up, you may trim the loose edges. Do not remove adhesive strips completely unless your health care provider tells you to do that.  Check your port insertion site every day for signs of infection. Check for: ? Redness, swelling, or pain. ? Fluid or blood. ? Warmth. ? Pus or a bad smell.      Activity  Return to your normal activities as told by your health care provider. Ask your health care provider what activities are safe for you.  Do not  lift anything that is heavier than 10 lb (4.5 kg), or the limit that you are told, until your health care provider says that it is safe. General instructions  Take over-the-counter and prescription medicines only as told by your health care provider.  Do not take baths, swim, or use a hot tub until your health care provider approves. Ask your health care provider if you may take showers. You may only be allowed to take sponge baths.  Do not drive for 24 hours if you were given a sedative during your procedure.  Wear a medical alert bracelet in case of an emergency. This will tell any health care providers that you have a port.  Keep all follow-up visits as told by your health care provider. This is important. Contact a health care provider if:  You cannot flush your port with saline as directed, or you cannot draw blood from the port.  You have a fever or chills.  You have redness, swelling, or pain around your port insertion site.  You have fluid or blood coming from your port insertion site.  Your port insertion site feels warm to the touch.  You have pus or a bad smell coming from the port insertion site. Get help right away if:  You have chest pain or shortness of breath.  You have bleeding from your port that you cannot control. Summary  Take care of the port as told by your   health care provider. Keep the manufacturer's information card with you at all times.  Change your dressing as told by your health care provider.  Contact a health care provider if you have a fever or chills or if you have redness, swelling, or pain around your port insertion site.  Keep all follow-up visits as told by your health care provider. This information is not intended to replace advice given to you by your health care provider. Make sure you discuss any questions you have with your health care provider. Document Revised: 10/07/2017 Document Reviewed: 10/07/2017 Elsevier Patient Education   2021 Elsevier Inc.  

## 2020-07-26 NOTE — Progress Notes (Signed)
[facility] OFFICE PROGRESS NOTE  [name], MD [address] [place] [address]  DIAGNOSIS: Stage IV non-small cell lung cancer, squamous cell carcinoma. She presented withright upper lobe lung mass in addition to pleural-based metastasis andmediastinal lymphadenopathy.She was diagnosed in September 2020.  PRIOR THERAPY:Chemotherapy with carboplatin for an AUC of 5,paclitaxel 175 mg/m, and Keytruda 200 mg IV every 3 weeks with Neulasta support.Last dose9/15/21.Status post18cycles. Starting from cycle #5wason maintenance treatment with single agent Keytruda every 3 weeks.This was discontinued due to evidence of disease progression.  CURRENT THERAPY: Palliative systemic chemotherapy with docetaxel 75 mg per metered squared and Cyramza 10 mg/kg IV every 3 weeks with Neulasta support. First dose on 01/04/20. Status post9cycles.Her dose of docetaxel was reduced to 65 mg/m2 starting from cycle #10 due to nail changes.   INTERVAL HISTORY: Neftaly Inzunza 75 y.o. female returns for a follow up visit. The patient is feeling fairly well today without any concerning complaints. She has been tolerating her treatment fairly well except she is starting to develop peripheral neuropathy and skin darkening from her treatment. She is taking gabapentin. Therefore, her chemotherapy with docetaxel was reduced to 65 mg/m2. She believes her last round of treatment may have been a little easier but she has been active and walking around shopping for several hours so she does continue to perform her typical activities.She denies any fever or weight loss.The patient reports her baseline intermittent night sweats. She reports her baseline dyspnea on exertion and cough.If she has wheezing, she uses her inhaler with improvement in her symptoms.She denies any hemoptysis. She denies any nausea, vomiting, or constipation. She experiences baseline intermittent diarrhea on  and off for several years that "depends on what she eats". She denies headaches. The patient is here for evaluation before starting cycle #11.    MEDICAL HISTORY: Past Medical History:  Diagnosis Date  . AAA (abdominal aortic aneurysm) ([facility])   . COPD (chronic obstructive pulmonary disease) ([place])   . Depression   . Essential hypertension   . Headache   . History of migraine headaches   . lung ca dx'd 10/2018  . Sleep apnea   . Tobacco use disorder     ALLERGIES:  is allergic to codeine sulfate and pantoprazole sodium.  MEDICATIONS:  Current Outpatient Medications  Medication Sig Dispense Refill  . acetaminophen (TYLENOL) 500 MG tablet Take 500 mg by mouth every 6 (six) hours as needed for moderate pain.    Marland Kitchen albuterol (VENTOLIN HFA) 108 (90 Base) MCG/ACT inhaler Inhale 2 puffs into the lungs every 6 (six) hours as needed for wheezing or shortness of breath. 36 g 2  . amLODipine (NORVASC) 10 MG tablet Take 1 tablet (10 mg total) by mouth daily. 90 tablet 3  . atorvastatin (LIPITOR) 20 MG tablet TAKE 1 TABLET EVERY DAY 90 tablet 3  . benzonatate (TESSALON) 100 MG capsule TAKE 1 CAPSULE EVERY 8 HOURS AS NEEDED FOR COUGH (Patient taking differently: Take 100 mg by mouth 3 (three) times daily as needed for cough.) 90 capsule 0  . chlorthalidone (HYGROTON) 50 MG tablet TAKE 1 TABLET EVERY DAY (Patient taking differently: Take 50 mg by mouth daily.) 90 tablet 3  . dexamethasone (DECADRON) 4 MG tablet Please take TWO tablets TWICE a day the day before, the day of, and the day after chemotherapy (Patient taking differently: Take 8 mg by mouth See admin instructions. Please take TWO tablets TWICE a day the day before, the day of,  and the day after chemotherapy) 120 tablet 2  . famotidine (PEPCID) 20 MG tablet Take 20 mg by mouth daily.     Marland Kitchen FLUoxetine (PROZAC) 20 MG capsule TAKE 1 CAPSULE EVERY DAY (Patient taking differently: Take 20 mg by mouth daily.) 90 capsule 3  . gabapentin (NEURONTIN) 100  MG capsule Take 1 capsule (100 mg total) by mouth 2 (two) times daily. 90 capsule 2  . HYDROcodone-acetaminophen (NORCO) 7.5-325 MG tablet Take 1 tablet by mouth every 6 (six) hours as needed for moderate pain. 60 tablet 0  . lidocaine-prilocaine (EMLA) cream Apply 1 application topically as needed. (Patient taking differently: Apply 1 application topically as needed (for chemotherapy).) 30 g 0  . magnesium oxide (MAG-OX) 400 MG tablet Take 1 tablet (400 mg total) by mouth daily. 30 tablet 1  . polyvinyl alcohol (LIQUIFILM TEARS) 1.4 % ophthalmic solution Place 1 drop into both eyes as needed for dry eyes.    . potassium chloride SA (KLOR-CON M20) 20 MEQ tablet Take 1 tablet (20 mEq total) by mouth daily. 10 tablet 0  . tiotropium (SPIRIVA HANDIHALER) 18 MCG inhalation capsule Place 1 capsule (18 mcg total) into inhaler and inhale daily. 30 capsule 2  . fluticasone (FLONASE) 50 MCG/ACT nasal spray Place 1 spray into both nostrils daily. 11.1 mL 0   No current facility-administered medications for this visit.   Facility-Administered Medications Ordered in Other Visits  Medication Dose Route Frequency Provider Last Rate Last Admin  . acetaminophen (TYLENOL) tablet 650 mg  650 mg Oral Once Curt Bears, MD      . dexamethasone (DECADRON) 10 mg in sodium chloride 0.9 % 50 mL IVPB  10 mg Intravenous Once Curt Bears, MD      . diphenhydrAMINE (BENADRYL) injection 50 mg  50 mg Intravenous Once Curt Bears, MD      . DOCEtaxel (TAXOTERE) 110 mg in sodium chloride 0.9 % 250 mL chemo infusion  65 mg/m2 (Treatment Plan Recorded) Intravenous Once Curt Bears, MD      . heparin lock flush 100 unit/mL  500 Units Intracatheter Once PRN Curt Bears, MD      . ramucirumab [facility]) 600 mg in sodium chloride 0.9 % 190 mL chemo infusion  10 mg/kg (Treatment Plan Recorded) Intravenous Once Curt Bears, MD      . sodium chloride flush (NS) 0.9 % injection 10 mL  10 mL Intracatheter PRN  Curt Bears, MD        SURGICAL HISTORY:  Past Surgical History:  Procedure Laterality Date  . ABDOMINAL HYSTERECTOMY    . CHOLECYSTECTOMY    . IR IMAGING GUIDED PORT INSERTION  02/24/2019  . ROTATOR CUFF REPAIR  3/04  . VIDEO BRONCHOSCOPY WITH ENDOBRONCHIAL ULTRASOUND Right 11/25/2018   Procedure: VIDEO BRONCHOSCOPY WITH ENDOBRONCHIAL ULTRASOUND;  Surgeon: Collene Gobble, MD;  Location: [facility] OR;  Service: Thoracic;  Laterality: Right;    REVIEW OF SYSTEMS:   Constitutional:Positive for fatigue.Negative forweight lossand fever HENT: Negative for nosebleeds, sore throat and trouble swallowing.  Eyes: Negative for eye problems and icterus.  Respiratory:Positive for mild cough and baseline dyspnea on exertion.Negative for hemoptysisand wheezing.  Cardiovascular:Positive for intermittent left lower rib pain (none at this time).Negative for leg swelling. Gastrointestinal:Positive forbaseline intermittentdiarrhea(controlled).Negative for abdominal pain, constipation, nausea and vomiting. Genitourinary: Negative for bladder incontinence, difficulty urinating, dysuria, frequency and hematuria.  Musculoskeletal: Negative for back pain, gait problem, neck pain and neck stiffness.  Skin: Positive for nail darkening and falling off.  Negative for itching  and rash.  Neurological:Negative for dizziness, headaches, extremity weakness, gait problem, light-headedness and seizures.  Hematological: Negative for adenopathy. Does not bruise/bleed easily.  Psychiatric/Behavioral: Negative for confusion, depression and sleep disturbance. The patient is not nervous/anxious.   PHYSICAL EXAMINATION:  Blood pressure 121/81, pulse 81, temperature 98.1 F (36.7 C), temperature source Oral, resp. rate 17, weight 138 lb 12.8 oz (63 kg), SpO2 98 %.  ECOG PERFORMANCE STATUS: 1 - Symptomatic but completely ambulatory  Physical Exam  Constitutional: Oriented to person, place, and time and  well-developed, well-nourished, and in no distress.  HENT:  Head: Normocephalic and atraumatic.  Mouth/Throat: Oropharynx is clear and moist. No oropharyngeal exudate. Eyes: Conjunctivae are normal. Right eye exhibits no discharge. Left eye exhibits no discharge. No scleral icterus.  Neck: Normal range of motion. Neck supple.  Cardiovascular: Normal rate, regular rhythm, normal heart sounds and intact distal pulses.  Pulmonary/Chest: Effort normal and breath sounds normal. No respiratory distress. No wheezes. No rales.  Abdominal: Soft. Bowel sounds are normal. Exhibits no distension and no mass. There is no tenderness.  Musculoskeletal: Normal range of motion. Exhibits no edema.  Lymphadenopathy:  No cervical adenopathy.  Neurological: Alert and oriented to person, place, and time. Exhibits normal muscle tone. Gait normal. Coordination normal.  Skin: Darkening and cracking of finger nails. Skin is warm and dry. No rash noted. Not diaphoretic. No erythema. No pallor.  Psychiatric: Mood, memory and judgment normal.  Vitals reviewed.  LABORATORY DATA: Lab Results  Component Value Date   WBC 11.3 (H) 08/02/2020   HGB 10.0 (L) 08/02/2020   HCT 29.8 (L) 08/02/2020   MCV 99.7 08/02/2020   PLT 213 08/02/2020      Chemistry      Component Value Date/Time   NA 137 08/02/2020 1050   NA 139 07/21/2017 0958   K 3.5 08/02/2020 1050   CL 99 08/02/2020 1050   CO2 28 08/02/2020 1050   BUN 36 (H) 08/02/2020 1050   BUN 26 07/21/2017 0958   CREATININE 1.05 (H) 08/02/2020 1050   CREATININE 0.79 09/03/2013 1540      Component Value Date/Time   CALCIUM 9.7 08/02/2020 1050   ALKPHOS 60 08/02/2020 1050   AST 13 (L) 08/02/2020 1050   ALT <6 08/02/2020 1050   BILITOT 0.6 08/02/2020 1050       RADIOGRAPHIC STUDIES:  CT Chest W Contrast  Result Date: 07/09/2020 CLINICAL DATA:  Non-small cell lung cancer restaging, ongoing chemotherapy EXAM: CT CHEST, ABDOMEN, AND PELVIS WITH CONTRAST  TECHNIQUE: Multidetector CT imaging of the chest, abdomen and pelvis was performed following the standard protocol during bolus administration of intravenous contrast. CONTRAST:  175m OMNIPAQUE IOHEXOL 300 MG/ML SOLN, additional oral enteric contrast COMPARISON:  CT chest, 05/08/2020, CT abdomen pelvis, 04/26/2020 FINDINGS: CT CHEST FINDINGS Cardiovascular: Right chest port catheter. Aortic atherosclerosis. Unchanged enlargement of the tubular ascending thoracic aorta, measuring up to 4.1 x 4.0 cm (series 2, image 25). Normal heart size. Left coronary artery calcifications. No pericardial effusion. Enlargement of the main pulmonary artery measuring up to 3.7 cm in caliber. Mediastinum/Nodes: Unchanged prominent mediastinal lymph nodes, largest pretracheal nodes measuring up to 1.4 x 0.8 cm (series 2, image 21). Thyroid gland, trachea, and esophagus demonstrate no significant findings. Lungs/Pleura: Moderate centrilobular and paraseptal emphysema. Diffuse bilateral bronchial wall thickening. Bandlike scarring and volume loss of the left greater than right lung bases. Stable small pulmonary nodules of the right upper lobe, measuring 3 mm (series 4, image 40) and 2 mm (series  4, image 45). No pleural effusion or pneumothorax. Musculoskeletal: No chest wall mass or suspicious bone lesions identified. CT ABDOMEN PELVIS FINDINGS Hepatobiliary: No solid liver abnormality is seen. No gallstones, gallbladder wall thickening, or biliary dilatation. Pancreas: Unremarkable. No pancreatic ductal dilatation or surrounding inflammatory changes. Spleen: Normal in size without significant abnormality. Adrenals/Urinary Tract: Adrenal glands are unremarkable. Kidneys are normal, without renal calculi, solid lesion, or hydronephrosis. Bladder is unremarkable. Stomach/Bowel: Stomach is within normal limits. Appendix appears normal. No evidence of bowel wall thickening, distention, or inflammatory changes. Vascular/Lymphatic: Aortic  atherosclerosis. 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 No enlarged abdominal or pelvic lymph nodes. Reproductive: Status post hysterectomy. Unchanged left ovarian cyst measuring up to 7.5 x 6.8 cm (series 2, image 87). Other: No abdominal wall hernia or abnormality. No abdominopelvic ascites. Musculoskeletal: No acute or significant osseous findings. IMPRESSION: 1. Stable small pulmonary nodules of the right upper lobe. 2. Unchanged prominent mediastinal lymph nodes. 3. No evidence of metastatic disease in the abdomen or pelvis. 4. Findings are consistent with sustained treatment response. 5. Unchanged enlargement of the tubular ascending thoracic aorta, measuring up to 4.1 x 4.0 cm. 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 Recommend annual imaging followup by CTA or MRA. This recommendation follows 2010 ACCF/AHA/AATS/ACR/ASA/SCA/SCAI/SIR/STS/SVM Guidelines for the Diagnosis and Management of Patients with Thoracic Aortic Disease. Circulation. 2010; 121: E527-P824. Aortic aneurysm NOS (ICD10-I71.9) 6. Coronary artery disease. 7. Emphysema. 8. Enlargement of the main pulmonary artery, as can be seen in pulmonary hypertension. Aortic Atherosclerosis (ICD10-I70.0) and Emphysema (ICD10-J43.9). Electronically Signed   By: Eddie Candle M.D.   On: 07/09/2020 18:50   CT Abdomen Pelvis W Contrast  Result Date: 07/09/2020 CLINICAL DATA:  Non-small cell lung cancer restaging, ongoing chemotherapy EXAM: CT CHEST, ABDOMEN, AND PELVIS WITH CONTRAST TECHNIQUE: Multidetector CT imaging of  the chest, abdomen and pelvis was performed following the standard protocol during bolus administration of intravenous contrast. CONTRAST:  154m OMNIPAQUE IOHEXOL 300 MG/ML SOLN, additional oral enteric contrast COMPARISON:  CT chest, 05/08/2020, CT abdomen pelvis, 04/26/2020 FINDINGS: CT CHEST FINDINGS Cardiovascular: Right chest port catheter. Aortic atherosclerosis. Unchanged enlargement of the tubular ascending thoracic aorta, measuring up to 4.1 x 4.0 cm (series 2, image 25). Normal heart size. Left coronary artery calcifications. No pericardial effusion. Enlargement of the main pulmonary artery measuring up to 3.7 cm in caliber. Mediastinum/Nodes: Unchanged prominent mediastinal lymph nodes, largest pretracheal nodes measuring up to 1.4 x 0.8 cm (series 2, image 21). Thyroid gland, trachea, and esophagus demonstrate no significant findings. Lungs/Pleura: Moderate centrilobular and paraseptal emphysema. Diffuse bilateral bronchial wall thickening. Bandlike scarring and volume loss of the left greater than right lung bases. Stable small pulmonary nodules of the right upper lobe, measuring 3 mm (series 4, image 40) and 2 mm (series 4, image 45). No pleural effusion or pneumothorax. Musculoskeletal: No chest wall mass or suspicious bone lesions identified. CT ABDOMEN PELVIS FINDINGS Hepatobiliary: No solid liver abnormality is seen. No gallstones, gallbladder wall thickening, or biliary dilatation. Pancreas: Unremarkable. No pancreatic ductal dilatation or surrounding inflammatory changes. Spleen: Normal in size without significant abnormality. Adrenals/Urinary Tract: Adrenal glands are unremarkable. Kidneys are normal, without renal calculi, solid  lesion, or hydronephrosis. Bladder is unremarkable. Stomach/Bowel: Stomach is within normal limits. Appendix appears normal. No evidence of bowel wall thickening, distention, or inflammatory changes. Vascular/Lymphatic: Aortic atherosclerosis. Unchanged fusiform  aneurysm of the thoracoabdominal aorta involving the distal descending thoracic aorta and proximal and juxtarenal abdominal aorta, maximum caliber just inferior to the diaphragmatic hiatus approximately 4.4 x 4.0 cm, not significantly changed. No enlarged abdominal or pelvic lymph nodes. Reproductive: Status post hysterectomy. Unchanged left ovarian cyst measuring up to 7.5 x 6.8 cm (series 2, image 87). Other: No abdominal wall hernia or abnormality. No abdominopelvic ascites. Musculoskeletal: No acute or significant osseous findings. IMPRESSION: 1. Stable small pulmonary nodules of the right upper lobe. 2. Unchanged prominent mediastinal lymph nodes. 3. No evidence of metastatic disease in the abdomen or pelvis. 4. Findings are consistent with sustained treatment response. 5. Unchanged enlargement of the tubular ascending thoracic aorta, measuring up to 4.1 x 4.0 cm. 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 Recommend annual imaging followup by CTA or MRA. This recommendation follows 2010 ACCF/AHA/AATS/ACR/ASA/SCA/SCAI/SIR/STS/SVM Guidelines for the Diagnosis and Management of Patients with Thoracic Aortic Disease. Circulation. 2010; 121: Y233-I356. Aortic aneurysm NOS (ICD10-I71.9) 6. Coronary artery disease. 7. Emphysema. 8. Enlargement of the main pulmonary artery, as can be seen in pulmonary hypertension. Aortic Atherosclerosis (ICD10-I70.0) and Emphysema (ICD10-J43.9). Electronically Signed   By: Eddie Candle M.D.   On: 07/09/2020 18:50     ASSESSMENT/PLAN:  This is a very pleasant 75 year old African-American female diagnosed with stage IV non-small cell lung cancer, squamous cell carcinoma. She presented with a right upper lobe lung mass and pleural based metastases and mediastinal  lymphadenopathy.She was diagnosed in August 2020   The patientwas onsystemicchemotherapy with carboplatin for an AUC of 5, paclitaxel 175 mg/m, and Keytruda 200 mg IV every 3 weeks with Neulasta support.Starting from cycle #5, shewas onmaintenance with single agent Keytruda.She is status post18cyclesof treatment and she tolerated it wellwithout any concerning adverse effectsexcept for mild fatigue.This was discontinued secondary to evidence of disease progression.  The patient is currently undergoing palliative systemic chemotherapy with docetaxel 75 mg per metered squared and Cyramza 10 mg/kg IV every 3 weeks with Neulasta support. She is status post10cycles and she is tolerating this well.She has some nail darkening and chipping of her nails. Her dose of docetaxel was reduced to 65 mg/m2 starting from cycle #10 due to peripheral neuropathy.    Labs were reviewed. Recommend she proceed with cycle #11 today as scheduled.   We will see her back for a follow up visit in 3 weeks for evaluation before starting cycle #12.   She will continue to take gabapentin for her peripheral neuropathy.   The patient's [employer] called for clarification about her Xarelto. She had a small thrombus near her port-a-cath site in March 2021. She completed a few months of anti-coagulation until about July 2021-August 2021. However, this medication was too expensive for her and we tried to obtain assistance. No other evidence of thrombus was seen on follow up routine imaging. We decided that she does not need additional anti-coagulation at that time.   The patient was advised to call immediately if she has any concerning symptoms in the interval. The patient voices understanding of current disease status and treatment options and is in agreement with the current care plan. All questions were answered. The  patient knows to call the clinic with any problems, questions or concerns. We can certainly see the  patient much sooner if necessary       No orders of the defined types were placed in this encounter.    I spent 20-29 minutes in this encounter.   Averie Meiner L Fatema Rabe, PA-C 08/02/20

## 2020-08-02 ENCOUNTER — Inpatient Hospital Stay: Payer: Medicare HMO

## 2020-08-02 ENCOUNTER — Other Ambulatory Visit: Payer: Self-pay

## 2020-08-02 ENCOUNTER — Inpatient Hospital Stay (HOSPITAL_BASED_OUTPATIENT_CLINIC_OR_DEPARTMENT_OTHER): Payer: Medicare HMO | Admitting: Physician Assistant

## 2020-08-02 ENCOUNTER — Encounter: Payer: Self-pay | Admitting: Physician Assistant

## 2020-08-02 ENCOUNTER — Other Ambulatory Visit: Payer: Medicare HMO

## 2020-08-02 VITALS — BP 121/81 | HR 81 | Temp 98.1°F | Resp 17 | Wt 138.8 lb

## 2020-08-02 DIAGNOSIS — C3491 Malignant neoplasm of unspecified part of right bronchus or lung: Secondary | ICD-10-CM

## 2020-08-02 DIAGNOSIS — Z95828 Presence of other vascular implants and grafts: Secondary | ICD-10-CM

## 2020-08-02 DIAGNOSIS — Z5111 Encounter for antineoplastic chemotherapy: Secondary | ICD-10-CM | POA: Diagnosis not present

## 2020-08-02 LAB — CBC WITH DIFFERENTIAL (CANCER CENTER ONLY)
Abs Immature Granulocytes: 0.07 10*3/uL (ref 0.00–0.07)
Basophils Absolute: 0 10*3/uL (ref 0.0–0.1)
Basophils Relative: 0 %
Eosinophils Absolute: 0 10*3/uL (ref 0.0–0.5)
Eosinophils Relative: 0 %
HCT: 29.8 % — ABNORMAL LOW (ref 36.0–46.0)
Hemoglobin: 10 g/dL — ABNORMAL LOW (ref 12.0–15.0)
Immature Granulocytes: 1 %
Lymphocytes Relative: 10 %
Lymphs Abs: 1.1 10*3/uL (ref 0.7–4.0)
MCH: 33.4 pg (ref 26.0–34.0)
MCHC: 33.6 g/dL (ref 30.0–36.0)
MCV: 99.7 fL (ref 80.0–100.0)
Monocytes Absolute: 0.3 10*3/uL (ref 0.1–1.0)
Monocytes Relative: 2 %
Neutro Abs: 9.8 10*3/uL — ABNORMAL HIGH (ref 1.7–7.7)
Neutrophils Relative %: 87 %
Platelet Count: 213 10*3/uL (ref 150–400)
RBC: 2.99 MIL/uL — ABNORMAL LOW (ref 3.87–5.11)
RDW: 16.8 % — ABNORMAL HIGH (ref 11.5–15.5)
WBC Count: 11.3 10*3/uL — ABNORMAL HIGH (ref 4.0–10.5)
nRBC: 0 % (ref 0.0–0.2)

## 2020-08-02 LAB — CMP (CANCER CENTER ONLY)
ALT: <6 U/L (ref 0–44)
AST: 13 U/L — ABNORMAL LOW (ref 15–41)
Albumin: 3.8 g/dL (ref 3.5–5.0)
Alkaline Phosphatase: 60 U/L (ref 38–126)
Anion gap: 10 (ref 5–15)
BUN: 36 mg/dL — ABNORMAL HIGH (ref 8–23)
CO2: 28 mmol/L (ref 22–32)
Calcium: 9.7 mg/dL (ref 8.9–10.3)
Chloride: 99 mmol/L (ref 98–111)
Creatinine: 1.05 mg/dL — ABNORMAL HIGH (ref 0.44–1.00)
GFR, Estimated: 56 mL/min — ABNORMAL LOW (ref >60)
Glucose, Bld: 116 mg/dL — ABNORMAL HIGH (ref 70–99)
Potassium: 3.5 mmol/L (ref 3.5–5.1)
Sodium: 137 mmol/L (ref 135–145)
Total Bilirubin: 0.6 mg/dL (ref 0.3–1.2)
Total Protein: 6.8 g/dL (ref 6.5–8.1)

## 2020-08-02 LAB — TOTAL PROTEIN, URINE DIPSTICK: Protein, ur: NEGATIVE mg/dL

## 2020-08-02 MED ORDER — SODIUM CHLORIDE 0.9 % IV SOLN
10.0000 mg/kg | Freq: Once | INTRAVENOUS | Status: AC
  Administered 2020-08-02: 600 mg via INTRAVENOUS
  Filled 2020-08-02: qty 50

## 2020-08-02 MED ORDER — SODIUM CHLORIDE 0.9% FLUSH
10.0000 mL | INTRAVENOUS | Status: DC | PRN
  Administered 2020-08-02: 10 mL
  Filled 2020-08-02: qty 10

## 2020-08-02 MED ORDER — HEPARIN SOD (PORK) LOCK FLUSH 100 UNIT/ML IV SOLN
500.0000 [IU] | Freq: Once | INTRAVENOUS | Status: AC | PRN
  Administered 2020-08-02: 500 [IU]
  Filled 2020-08-02: qty 5

## 2020-08-02 MED ORDER — SODIUM CHLORIDE 0.9 % IV SOLN
65.0000 mg/m2 | Freq: Once | INTRAVENOUS | Status: AC
  Administered 2020-08-02: 110 mg via INTRAVENOUS
  Filled 2020-08-02: qty 11

## 2020-08-02 MED ORDER — DIPHENHYDRAMINE HCL 50 MG/ML IJ SOLN
50.0000 mg | Freq: Once | INTRAMUSCULAR | Status: AC
  Administered 2020-08-02: 50 mg via INTRAVENOUS

## 2020-08-02 MED ORDER — DIPHENHYDRAMINE HCL 50 MG/ML IJ SOLN
INTRAMUSCULAR | Status: AC
  Filled 2020-08-02: qty 1

## 2020-08-02 MED ORDER — ACETAMINOPHEN 325 MG PO TABS
ORAL_TABLET | ORAL | Status: AC
  Filled 2020-08-02: qty 2

## 2020-08-02 MED ORDER — ACETAMINOPHEN 325 MG PO TABS
650.0000 mg | ORAL_TABLET | Freq: Once | ORAL | Status: AC
  Administered 2020-08-02: 650 mg via ORAL

## 2020-08-02 MED ORDER — SODIUM CHLORIDE 0.9 % IV SOLN
10.0000 mg | Freq: Once | INTRAVENOUS | Status: AC
  Administered 2020-08-02: 10 mg via INTRAVENOUS
  Filled 2020-08-02: qty 10

## 2020-08-02 MED ORDER — SODIUM CHLORIDE 0.9 % IV SOLN
Freq: Once | INTRAVENOUS | Status: AC
  Filled 2020-08-02: qty 250

## 2020-08-02 NOTE — Patient Instructions (Signed)
[facility] Discharge Instructions for Patients Receiving Chemotherapy  Today you received the following chemotherapy agents ramacirumab, docetaxel  To help prevent nausea and vomiting after your treatment, we encourage you to take your nausea medication as directed.   If you develop nausea and vomiting that is not controlled by your nausea medication, call the clinic.   BELOW ARE SYMPTOMS THAT SHOULD BE REPORTED IMMEDIATELY:  *FEVER GREATER THAN 100.5 F  *CHILLS WITH OR WITHOUT FEVER  NAUSEA AND VOMITING THAT IS NOT CONTROLLED WITH YOUR NAUSEA MEDICATION  *UNUSUAL SHORTNESS OF BREATH  *UNUSUAL BRUISING OR BLEEDING  TENDERNESS IN MOUTH AND THROAT WITH OR WITHOUT PRESENCE OF ULCERS  *URINARY PROBLEMS  *BOWEL PROBLEMS  UNUSUAL RASH Items with * indicate a potential emergency and should be followed up as soon as possible.  Feel free to call the clinic should you have any questions or concerns. The clinic phone number is [phone].  Please show the [facility] at check-in to the Emergency Department and triage nurse.

## 2020-08-02 NOTE — Patient Instructions (Signed)
Implanted Port Insertion, Care After This sheet gives you information about how to care for yourself after your procedure. Your health care provider may also give you more specific instructions. If you have problems or questions, contact your health care provider. What can I expect after the procedure? After the procedure, it is common to have:  Discomfort at the port insertion site.  Bruising on the skin over the port. This should improve over 3-4 days. Follow these instructions at home: Port care  After your port is placed, you will get a manufacturer's information card. The card has information about your port. Keep this card with you at all times.  Take care of the port as told by your health care provider. Ask your health care provider if you or a family member can get training for taking care of the port at home. A home health care nurse may also take care of the port.  Make sure to remember what type of port you have. Incision care  Follow instructions from your health care provider about how to take care of your port insertion site. Make sure you: ? Wash your hands with soap and water before and after you change your bandage (dressing). If soap and water are not available, use hand sanitizer. ? Change your dressing as told by your health care provider. ? Leave stitches (sutures), skin glue, or adhesive strips in place. These skin closures may need to stay in place for 2 weeks or longer. If adhesive strip edges start to loosen and curl up, you may trim the loose edges. Do not remove adhesive strips completely unless your health care provider tells you to do that.  Check your port insertion site every day for signs of infection. Check for: ? Redness, swelling, or pain. ? Fluid or blood. ? Warmth. ? Pus or a bad smell.      Activity  Return to your normal activities as told by your health care provider. Ask your health care provider what activities are safe for you.  Do not  lift anything that is heavier than 10 lb (4.5 kg), or the limit that you are told, until your health care provider says that it is safe. General instructions  Take over-the-counter and prescription medicines only as told by your health care provider.  Do not take baths, swim, or use a hot tub until your health care provider approves. Ask your health care provider if you may take showers. You may only be allowed to take sponge baths.  Do not drive for 24 hours if you were given a sedative during your procedure.  Wear a medical alert bracelet in case of an emergency. This will tell any health care providers that you have a port.  Keep all follow-up visits as told by your health care provider. This is important. Contact a health care provider if:  You cannot flush your port with saline as directed, or you cannot draw blood from the port.  You have a fever or chills.  You have redness, swelling, or pain around your port insertion site.  You have fluid or blood coming from your port insertion site.  Your port insertion site feels warm to the touch.  You have pus or a bad smell coming from the port insertion site. Get help right away if:  You have chest pain or shortness of breath.  You have bleeding from your port that you cannot control. Summary  Take care of the port as told by your   health care provider. Keep the manufacturer's information card with you at all times.  Change your dressing as told by your health care provider.  Contact a health care provider if you have a fever or chills or if you have redness, swelling, or pain around your port insertion site.  Keep all follow-up visits as told by your health care provider. This information is not intended to replace advice given to you by your health care provider. Make sure you discuss any questions you have with your health care provider. Document Revised: 10/07/2017 Document Reviewed: 10/07/2017 Elsevier Patient Education   2021 Elsevier Inc.  

## 2020-08-04 ENCOUNTER — Other Ambulatory Visit: Payer: Self-pay

## 2020-08-04 ENCOUNTER — Inpatient Hospital Stay: Payer: Medicare HMO

## 2020-08-04 VITALS — BP 117/81 | HR 72 | Temp 98.7°F | Resp 18

## 2020-08-04 DIAGNOSIS — C3491 Malignant neoplasm of unspecified part of right bronchus or lung: Secondary | ICD-10-CM

## 2020-08-04 DIAGNOSIS — Z5111 Encounter for antineoplastic chemotherapy: Secondary | ICD-10-CM | POA: Diagnosis not present

## 2020-08-04 MED ORDER — PEGFILGRASTIM-JMDB 6 MG/0.6ML ~~LOC~~ SOSY
PREFILLED_SYRINGE | SUBCUTANEOUS | Status: AC
  Filled 2020-08-04: qty 0.6

## 2020-08-04 MED ORDER — PEGFILGRASTIM-JMDB 6 MG/0.6ML ~~LOC~~ SOSY
6.0000 mg | PREFILLED_SYRINGE | Freq: Once | SUBCUTANEOUS | Status: AC
  Administered 2020-08-04: 6 mg via SUBCUTANEOUS

## 2020-08-04 NOTE — Patient Instructions (Signed)
Pegfilgrastim injection What is this medicine? PEGFILGRASTIM (PEG fil gra stim) is a long-acting granulocyte colony-stimulating factor that stimulates the growth of neutrophils, a type of white blood cell important in the body's fight against infection. It is used to reduce the incidence of fever and infection in patients with certain types of cancer who are receiving chemotherapy that affects the bone marrow, and to increase survival after being exposed to high doses of radiation. This medicine may be used for other purposes; ask your health care provider or pharmacist if you have questions. COMMON BRAND NAME(S): Rexene Edison, Ziextenzo What should I tell my health care provider before I take this medicine? They need to know if you have any of these conditions:  kidney disease  latex allergy  ongoing radiation therapy  sickle cell disease  skin reactions to acrylic adhesives (On-Body Injector only)  an unusual or allergic reaction to pegfilgrastim, filgrastim, other medicines, foods, dyes, or preservatives  pregnant or trying to get pregnant  breast-feeding How should I use this medicine? This medicine is for injection under the skin. If you get this medicine at home, you will be taught how to prepare and give the pre-filled syringe or how to use the On-body Injector. Refer to the patient Instructions for Use for detailed instructions. Use exactly as directed. Tell your healthcare provider immediately if you suspect that the On-body Injector may not have performed as intended or if you suspect the use of the On-body Injector resulted in a missed or partial dose. It is important that you put your used needles and syringes in a special sharps container. Do not put them in a trash can. If you do not have a sharps container, call your pharmacist or healthcare provider to get one. Talk to your pediatrician regarding the use of this medicine in children. While this drug  may be prescribed for selected conditions, precautions do apply. Overdosage: If you think you have taken too much of this medicine contact a poison control center or emergency room at once. NOTE: This medicine is only for you. Do not share this medicine with others. What if I miss a dose? It is important not to miss your dose. Call your doctor or health care professional if you miss your dose. If you miss a dose due to an On-body Injector failure or leakage, a new dose should be administered as soon as possible using a single prefilled syringe for manual use. What may interact with this medicine? Interactions have not been studied. This list may not describe all possible interactions. Give your health care provider a list of all the medicines, herbs, non-prescription drugs, or dietary supplements you use. Also tell them if you smoke, drink alcohol, or use illegal drugs. Some items may interact with your medicine. What should I watch for while using this medicine? Your condition will be monitored carefully while you are receiving this medicine. You may need blood work done while you are taking this medicine. Talk to your health care provider about your risk of cancer. You may be more at risk for certain types of cancer if you take this medicine. If you are going to need a MRI, CT scan, or other procedure, tell your doctor that you are using this medicine (On-Body Injector only). What side effects may I notice from receiving this medicine? Side effects that you should report to your doctor or health care professional as soon as possible:  allergic reactions (skin rash, itching or hives, swelling of  the face, lips, or tongue)  back pain  dizziness  fever  pain, redness, or irritation at site where injected  pinpoint red spots on the skin  red or dark-brown urine  shortness of breath or breathing problems  stomach or side pain, or pain at the shoulder  swelling  tiredness  trouble  passing urine or change in the amount of urine  unusual bruising or bleeding Side effects that usually do not require medical attention (report to your doctor or health care professional if they continue or are bothersome):  bone pain  muscle pain This list may not describe all possible side effects. Call your doctor for medical advice about side effects. You may report side effects to FDA at 1-800-FDA-1088. Where should I keep my medicine? Keep out of the reach of children. If you are using this medicine at home, you will be instructed on how to store it. Throw away any unused medicine after the expiration date on the label. NOTE: This sheet is a summary. It may not cover all possible information. If you have questions about this medicine, talk to your doctor, pharmacist, or health care provider.  2021 Elsevier/Gold Standard (2019-04-02 13:20:51)

## 2020-08-09 ENCOUNTER — Other Ambulatory Visit: Payer: Self-pay | Admitting: Physician Assistant

## 2020-08-09 ENCOUNTER — Other Ambulatory Visit: Payer: Medicare HMO

## 2020-08-09 ENCOUNTER — Inpatient Hospital Stay: Payer: Medicare HMO

## 2020-08-09 ENCOUNTER — Other Ambulatory Visit: Payer: Self-pay

## 2020-08-09 DIAGNOSIS — C3491 Malignant neoplasm of unspecified part of right bronchus or lung: Secondary | ICD-10-CM

## 2020-08-09 DIAGNOSIS — Z5111 Encounter for antineoplastic chemotherapy: Secondary | ICD-10-CM | POA: Diagnosis not present

## 2020-08-09 DIAGNOSIS — E876 Hypokalemia: Secondary | ICD-10-CM

## 2020-08-09 DIAGNOSIS — Z95828 Presence of other vascular implants and grafts: Secondary | ICD-10-CM

## 2020-08-09 LAB — CBC WITH DIFFERENTIAL (CANCER CENTER ONLY)
Abs Immature Granulocytes: 0.2 10*3/uL — ABNORMAL HIGH (ref 0.00–0.07)
Basophils Absolute: 0.1 10*3/uL (ref 0.0–0.1)
Basophils Relative: 1 %
Eosinophils Absolute: 0 10*3/uL (ref 0.0–0.5)
Eosinophils Relative: 0 %
HCT: 30.1 % — ABNORMAL LOW (ref 36.0–46.0)
Hemoglobin: 9.8 g/dL — ABNORMAL LOW (ref 12.0–15.0)
Immature Granulocytes: 4 %
Lymphocytes Relative: 33 %
Lymphs Abs: 1.8 10*3/uL (ref 0.7–4.0)
MCH: 32.9 pg (ref 26.0–34.0)
MCHC: 32.6 g/dL (ref 30.0–36.0)
MCV: 101 fL — ABNORMAL HIGH (ref 80.0–100.0)
Monocytes Absolute: 0.7 10*3/uL (ref 0.1–1.0)
Monocytes Relative: 13 %
Neutro Abs: 2.7 10*3/uL (ref 1.7–7.7)
Neutrophils Relative %: 49 %
Platelet Count: 112 10*3/uL — ABNORMAL LOW (ref 150–400)
RBC: 2.98 MIL/uL — ABNORMAL LOW (ref 3.87–5.11)
RDW: 16.5 % — ABNORMAL HIGH (ref 11.5–15.5)
WBC Count: 5.5 10*3/uL (ref 4.0–10.5)
nRBC: 0 % (ref 0.0–0.2)

## 2020-08-09 LAB — CMP (CANCER CENTER ONLY)
ALT: <6 U/L (ref 0–44)
AST: 13 U/L — ABNORMAL LOW (ref 15–41)
Albumin: 3.6 g/dL (ref 3.5–5.0)
Alkaline Phosphatase: 66 U/L (ref 38–126)
Anion gap: 11 (ref 5–15)
BUN: 20 mg/dL (ref 8–23)
CO2: 32 mmol/L (ref 22–32)
Calcium: 9.5 mg/dL (ref 8.9–10.3)
Chloride: 97 mmol/L — ABNORMAL LOW (ref 98–111)
Creatinine: 0.82 mg/dL (ref 0.44–1.00)
GFR, Estimated: >60 mL/min (ref >60)
Glucose, Bld: 84 mg/dL (ref 70–99)
Potassium: 2.8 mmol/L — ABNORMAL LOW (ref 3.5–5.1)
Sodium: 140 mmol/L (ref 135–145)
Total Bilirubin: 1.1 mg/dL (ref 0.3–1.2)
Total Protein: 6.5 g/dL (ref 6.5–8.1)

## 2020-08-09 MED ORDER — POTASSIUM CHLORIDE CRYS ER 20 MEQ PO TBCR: 20.0000 meq | EXTENDED_RELEASE_TABLET | Freq: Two times a day (BID) | ORAL | 0 refills | Status: DC

## 2020-08-09 MED ORDER — HEPARIN SOD (PORK) LOCK FLUSH 100 UNIT/ML IV SOLN
500.0000 [IU] | Freq: Once | INTRAVENOUS | Status: DC | PRN
  Filled 2020-08-09: qty 5

## 2020-08-09 MED ORDER — SODIUM CHLORIDE 0.9% FLUSH
10.0000 mL | INTRAVENOUS | Status: DC | PRN
  Filled 2020-08-09: qty 10

## 2020-08-09 NOTE — Progress Notes (Signed)
I called the patient to let her know her potassium is low. She states she had been having diarrhea. She has not tried imodium. I advised her to pick up imodium when she picks up her potassium prescription. She expressed understanding. Recommended for her to take her first dose of potassium tonight.

## 2020-08-15 ENCOUNTER — Other Ambulatory Visit: Payer: Self-pay | Admitting: Emergency Medicine

## 2020-08-15 NOTE — Telephone Encounter (Signed)
Will need appt for more refills.

## 2020-08-16 ENCOUNTER — Other Ambulatory Visit: Payer: Self-pay

## 2020-08-16 ENCOUNTER — Inpatient Hospital Stay: Payer: Medicare HMO

## 2020-08-16 ENCOUNTER — Other Ambulatory Visit: Payer: Medicare HMO

## 2020-08-16 DIAGNOSIS — C3491 Malignant neoplasm of unspecified part of right bronchus or lung: Secondary | ICD-10-CM

## 2020-08-16 DIAGNOSIS — Z5111 Encounter for antineoplastic chemotherapy: Secondary | ICD-10-CM | POA: Diagnosis not present

## 2020-08-16 DIAGNOSIS — Z95828 Presence of other vascular implants and grafts: Secondary | ICD-10-CM

## 2020-08-16 LAB — CBC WITH DIFFERENTIAL (CANCER CENTER ONLY)
Abs Immature Granulocytes: 0.15 10*3/uL — ABNORMAL HIGH (ref 0.00–0.07)
Basophils Absolute: 0 10*3/uL (ref 0.0–0.1)
Basophils Relative: 0 %
Eosinophils Absolute: 0 10*3/uL (ref 0.0–0.5)
Eosinophils Relative: 0 %
HCT: 32.1 % — ABNORMAL LOW (ref 36.0–46.0)
Hemoglobin: 10.2 g/dL — ABNORMAL LOW (ref 12.0–15.0)
Immature Granulocytes: 2 %
Lymphocytes Relative: 22 %
Lymphs Abs: 2 10*3/uL (ref 0.7–4.0)
MCH: 32.6 pg (ref 26.0–34.0)
MCHC: 31.8 g/dL (ref 30.0–36.0)
MCV: 102.6 fL — ABNORMAL HIGH (ref 80.0–100.0)
Monocytes Absolute: 0.4 10*3/uL (ref 0.1–1.0)
Monocytes Relative: 5 %
Neutro Abs: 6.8 10*3/uL (ref 1.7–7.7)
Neutrophils Relative %: 71 %
Platelet Count: 137 10*3/uL — ABNORMAL LOW (ref 150–400)
RBC: 3.13 MIL/uL — ABNORMAL LOW (ref 3.87–5.11)
RDW: 17.3 % — ABNORMAL HIGH (ref 11.5–15.5)
WBC Count: 9.4 10*3/uL (ref 4.0–10.5)
nRBC: 0 % (ref 0.0–0.2)

## 2020-08-16 LAB — CMP (CANCER CENTER ONLY)
ALT: <6 U/L (ref 0–44)
AST: 14 U/L — ABNORMAL LOW (ref 15–41)
Albumin: 3.5 g/dL (ref 3.5–5.0)
Alkaline Phosphatase: 77 U/L (ref 38–126)
Anion gap: 8 (ref 5–15)
BUN: 20 mg/dL (ref 8–23)
CO2: 29 mmol/L (ref 22–32)
Calcium: 9.3 mg/dL (ref 8.9–10.3)
Chloride: 104 mmol/L (ref 98–111)
Creatinine: 0.79 mg/dL (ref 0.44–1.00)
GFR, Estimated: >60 mL/min
Glucose, Bld: 77 mg/dL (ref 70–99)
Potassium: 4.1 mmol/L (ref 3.5–5.1)
Sodium: 141 mmol/L (ref 135–145)
Total Bilirubin: 0.5 mg/dL (ref 0.3–1.2)
Total Protein: 6.5 g/dL (ref 6.5–8.1)

## 2020-08-16 MED ORDER — SODIUM CHLORIDE 0.9% FLUSH
10.0000 mL | INTRAVENOUS | Status: DC | PRN
  Administered 2020-08-16: 10 mL
  Filled 2020-08-16: qty 10

## 2020-08-16 MED ORDER — LIDOCAINE-PRILOCAINE 2.5-2.5 % EX CREA: 1.0000 "application " | TOPICAL_CREAM | CUTANEOUS | 1 refills | Status: DC | PRN

## 2020-08-16 MED ORDER — HEPARIN SOD (PORK) LOCK FLUSH 100 UNIT/ML IV SOLN
500.0000 [IU] | Freq: Once | INTRAVENOUS | Status: AC | PRN
  Administered 2020-08-16: 500 [IU]
  Filled 2020-08-16: qty 5

## 2020-08-23 ENCOUNTER — Inpatient Hospital Stay: Payer: Medicare HMO

## 2020-08-23 ENCOUNTER — Other Ambulatory Visit: Payer: Self-pay

## 2020-08-23 ENCOUNTER — Inpatient Hospital Stay: Payer: Medicare HMO | Attending: Internal Medicine | Admitting: Internal Medicine

## 2020-08-23 ENCOUNTER — Encounter: Payer: Self-pay | Admitting: Internal Medicine

## 2020-08-23 ENCOUNTER — Other Ambulatory Visit: Payer: Self-pay | Admitting: Physician Assistant

## 2020-08-23 ENCOUNTER — Other Ambulatory Visit: Payer: Medicare HMO

## 2020-08-23 VITALS — HR 89

## 2020-08-23 VITALS — BP 133/86 | HR 102 | Temp 97.4°F | Resp 18 | Ht 61.0 in | Wt 140.5 lb

## 2020-08-23 DIAGNOSIS — Z5111 Encounter for antineoplastic chemotherapy: Secondary | ICD-10-CM | POA: Insufficient documentation

## 2020-08-23 DIAGNOSIS — Z79899 Other long term (current) drug therapy: Secondary | ICD-10-CM | POA: Diagnosis not present

## 2020-08-23 DIAGNOSIS — F17210 Nicotine dependence, cigarettes, uncomplicated: Secondary | ICD-10-CM | POA: Insufficient documentation

## 2020-08-23 DIAGNOSIS — I714 Abdominal aortic aneurysm, without rupture: Secondary | ICD-10-CM | POA: Diagnosis not present

## 2020-08-23 DIAGNOSIS — C3491 Malignant neoplasm of unspecified part of right bronchus or lung: Secondary | ICD-10-CM

## 2020-08-23 DIAGNOSIS — C3411 Malignant neoplasm of upper lobe, right bronchus or lung: Secondary | ICD-10-CM | POA: Diagnosis not present

## 2020-08-23 DIAGNOSIS — Z7901 Long term (current) use of anticoagulants: Secondary | ICD-10-CM | POA: Insufficient documentation

## 2020-08-23 DIAGNOSIS — R5383 Other fatigue: Secondary | ICD-10-CM | POA: Diagnosis not present

## 2020-08-23 DIAGNOSIS — C782 Secondary malignant neoplasm of pleura: Secondary | ICD-10-CM | POA: Diagnosis not present

## 2020-08-23 DIAGNOSIS — C771 Secondary and unspecified malignant neoplasm of intrathoracic lymph nodes: Secondary | ICD-10-CM | POA: Diagnosis not present

## 2020-08-23 DIAGNOSIS — G4730 Sleep apnea, unspecified: Secondary | ICD-10-CM | POA: Diagnosis not present

## 2020-08-23 DIAGNOSIS — Z86718 Personal history of other venous thrombosis and embolism: Secondary | ICD-10-CM | POA: Diagnosis not present

## 2020-08-23 DIAGNOSIS — R197 Diarrhea, unspecified: Secondary | ICD-10-CM | POA: Insufficient documentation

## 2020-08-23 DIAGNOSIS — I10 Essential (primary) hypertension: Secondary | ICD-10-CM | POA: Insufficient documentation

## 2020-08-23 DIAGNOSIS — J449 Chronic obstructive pulmonary disease, unspecified: Secondary | ICD-10-CM | POA: Diagnosis not present

## 2020-08-23 DIAGNOSIS — Z95828 Presence of other vascular implants and grafts: Secondary | ICD-10-CM

## 2020-08-23 LAB — CMP (CANCER CENTER ONLY)
ALT: 6 U/L (ref 0–44)
AST: 14 U/L — ABNORMAL LOW (ref 15–41)
Albumin: 3.9 g/dL (ref 3.5–5.0)
Alkaline Phosphatase: 52 U/L (ref 38–126)
Anion gap: 13 (ref 5–15)
BUN: 41 mg/dL — ABNORMAL HIGH (ref 8–23)
CO2: 26 mmol/L (ref 22–32)
Calcium: 9.9 mg/dL (ref 8.9–10.3)
Chloride: 99 mmol/L (ref 98–111)
Creatinine: 0.89 mg/dL (ref 0.44–1.00)
GFR, Estimated: >60 mL/min (ref >60)
Glucose, Bld: 122 mg/dL — ABNORMAL HIGH (ref 70–99)
Potassium: 3.8 mmol/L (ref 3.5–5.1)
Sodium: 138 mmol/L (ref 135–145)
Total Bilirubin: 0.5 mg/dL (ref 0.3–1.2)
Total Protein: 7 g/dL (ref 6.5–8.1)

## 2020-08-23 LAB — CBC WITH DIFFERENTIAL (CANCER CENTER ONLY)
Abs Immature Granulocytes: 0.08 10*3/uL — ABNORMAL HIGH (ref 0.00–0.07)
Basophils Absolute: 0 10*3/uL (ref 0.0–0.1)
Basophils Relative: 0 %
Eosinophils Absolute: 0 10*3/uL (ref 0.0–0.5)
Eosinophils Relative: 0 %
HCT: 30.9 % — ABNORMAL LOW (ref 36.0–46.0)
Hemoglobin: 10.2 g/dL — ABNORMAL LOW (ref 12.0–15.0)
Immature Granulocytes: 1 %
Lymphocytes Relative: 9 %
Lymphs Abs: 1 10*3/uL (ref 0.7–4.0)
MCH: 33.1 pg (ref 26.0–34.0)
MCHC: 33 g/dL (ref 30.0–36.0)
MCV: 100.3 fL — ABNORMAL HIGH (ref 80.0–100.0)
Monocytes Absolute: 0.1 10*3/uL (ref 0.1–1.0)
Monocytes Relative: 1 %
Neutro Abs: 9.3 10*3/uL — ABNORMAL HIGH (ref 1.7–7.7)
Neutrophils Relative %: 89 %
Platelet Count: 241 10*3/uL (ref 150–400)
RBC: 3.08 MIL/uL — ABNORMAL LOW (ref 3.87–5.11)
RDW: 17.1 % — ABNORMAL HIGH (ref 11.5–15.5)
WBC Count: 10.5 10*3/uL (ref 4.0–10.5)
nRBC: 0 % (ref 0.0–0.2)

## 2020-08-23 LAB — TSH: TSH: 0.741 u[IU]/mL (ref 0.308–3.960)

## 2020-08-23 LAB — TOTAL PROTEIN, URINE DIPSTICK: Protein, ur: NEGATIVE mg/dL

## 2020-08-23 MED ORDER — SODIUM CHLORIDE 0.9 % IV SOLN
10.0000 mg | Freq: Once | INTRAVENOUS | Status: AC
  Administered 2020-08-23: 10 mg via INTRAVENOUS
  Filled 2020-08-23: qty 10

## 2020-08-23 MED ORDER — ACETAMINOPHEN 325 MG PO TABS
ORAL_TABLET | ORAL | Status: AC
  Filled 2020-08-23: qty 2

## 2020-08-23 MED ORDER — DIPHENHYDRAMINE HCL 50 MG/ML IJ SOLN
INTRAMUSCULAR | Status: AC
  Filled 2020-08-23: qty 1

## 2020-08-23 MED ORDER — SODIUM CHLORIDE 0.9% FLUSH
10.0000 mL | INTRAVENOUS | Status: DC | PRN
  Administered 2020-08-23: 10 mL
  Filled 2020-08-23: qty 10

## 2020-08-23 MED ORDER — HEPARIN SOD (PORK) LOCK FLUSH 100 UNIT/ML IV SOLN
500.0000 [IU] | Freq: Once | INTRAVENOUS | Status: AC | PRN
  Administered 2020-08-23: 500 [IU]
  Filled 2020-08-23: qty 5

## 2020-08-23 MED ORDER — DIPHENHYDRAMINE HCL 50 MG/ML IJ SOLN
50.0000 mg | Freq: Once | INTRAMUSCULAR | Status: AC
Start: 2020-08-23 — End: 2020-08-23
  Administered 2020-08-23: 50 mg via INTRAVENOUS

## 2020-08-23 MED ORDER — ACETAMINOPHEN 325 MG PO TABS
650.0000 mg | ORAL_TABLET | Freq: Once | ORAL | Status: AC
  Administered 2020-08-23: 650 mg via ORAL

## 2020-08-23 MED ORDER — SODIUM CHLORIDE 0.9 % IV SOLN
10.0000 mg/kg | Freq: Once | INTRAVENOUS | Status: AC
  Administered 2020-08-23: 600 mg via INTRAVENOUS
  Filled 2020-08-23: qty 50

## 2020-08-23 MED ORDER — SODIUM CHLORIDE 0.9 % IV SOLN
65.0000 mg/m2 | Freq: Once | INTRAVENOUS | Status: AC
  Administered 2020-08-23: 110 mg via INTRAVENOUS
  Filled 2020-08-23: qty 11

## 2020-08-23 MED ORDER — SODIUM CHLORIDE 0.9 % IV SOLN
Freq: Once | INTRAVENOUS | Status: AC
  Filled 2020-08-23: qty 250

## 2020-08-23 NOTE — Patient Instructions (Signed)
[facility] Discharge Instructions for Patients Receiving Chemotherapy  Today you received the following chemotherapy agents ramacirumab, docetaxel  To help prevent nausea and vomiting after your treatment, we encourage you to take your nausea medication as directed.   If you develop nausea and vomiting that is not controlled by your nausea medication, call the clinic.   BELOW ARE SYMPTOMS THAT SHOULD BE REPORTED IMMEDIATELY:  *FEVER GREATER THAN 100.5 F  *CHILLS WITH OR WITHOUT FEVER  NAUSEA AND VOMITING THAT IS NOT CONTROLLED WITH YOUR NAUSEA MEDICATION  *UNUSUAL SHORTNESS OF BREATH  *UNUSUAL BRUISING OR BLEEDING  TENDERNESS IN MOUTH AND THROAT WITH OR WITHOUT PRESENCE OF ULCERS  *URINARY PROBLEMS  *BOWEL PROBLEMS  UNUSUAL RASH Items with * indicate a potential emergency and should be followed up as soon as possible.  Feel free to call the clinic should you have any questions or concerns. The clinic phone number is [phone].  Please show the [facility] at check-in to the Emergency Department and triage nurse.

## 2020-08-23 NOTE — Progress Notes (Signed)
[facility] [phone]   [phone]  OFFICE PROGRESS NOTE  [name], MD [place] [address]  DIAGNOSIS: Stage IV non-small cell lung cancer, squamous cell carcinoma. She presented withright upper lobe lung mass in addition to pleural-based metastasis andmediastinal lymphadenopathy.She was diagnosed in September 2020.  PRIOR THERAPY: Chemotherapy with carboplatin for an AUC of 5,paclitaxel 175 mg/m, and Keytruda 200 mg IV every 3 weeks with Neulasta support.Last dose9/15/21.Status post18cycles. Starting from cycle #5 was on maintenance treatment with single agent Keytruda every 3 weeks.This was discontinued due to evidence of disease progression.   CURRENT THERAPY: Palliative systemic chemotherapy with docetaxel 75 mg per metered squared and Cyramza 10 mg/kg IV every 3 weeks with Neulasta support. First dose on 01/04/20.  Status post 11 cycles.  Starting from cycle #10 docetaxel was reduced to 65 Mg/M2  INTERVAL HISTORY: Debra Rodgers 75 y.o. female returns to the clinic today for follow-up visit.  The patient is feeling fine today with no concerning complaints except for mild fatigue and few episodes of diarrhea.  She takes Imodium on as-needed basis.  She also takes Claritin for the Neulasta injection.  She denied having any chest pain, shortness of breath, cough or hemoptysis.  She has no nausea, vomiting, abdominal pain or constipation.  She denied having any significant weight loss or night sweats.  She is here today for evaluation before starting cycle #12 of her treatment.  MEDICAL HISTORY: Past Medical History:  Diagnosis Date  . AAA (abdominal aortic aneurysm) ([facility])   . COPD (chronic obstructive pulmonary disease) ([facility])   . Depression   . Essential hypertension   . Headache   . History of migraine headaches   . lung ca dx'd 10/2018  . Sleep apnea   . Tobacco use disorder     ALLERGIES:   is allergic to codeine sulfate and pantoprazole sodium.  MEDICATIONS:  Current Outpatient Medications  Medication Sig Dispense Refill  . acetaminophen (TYLENOL) 500 MG tablet Take 500 mg by mouth every 6 (six) hours as needed for moderate pain.    Marland Kitchen albuterol (VENTOLIN HFA) 108 (90 Base) MCG/ACT inhaler INHALE 2 PUFFS INTO THE LUNGS EVERY 6 (SIX) HOURS AS NEEDED FOR WHEEZING OR SHORTNESS OF BREATH. 4 each 0  . amLODipine (NORVASC) 10 MG tablet Take 1 tablet (10 mg total) by mouth daily. 90 tablet 3  . atorvastatin (LIPITOR) 20 MG tablet TAKE 1 TABLET EVERY DAY 90 tablet 3  . benzonatate (TESSALON) 100 MG capsule TAKE 1 CAPSULE EVERY 8 HOURS AS NEEDED FOR COUGH (Patient taking differently: Take 100 mg by mouth 3 (three) times daily as needed for cough.) 90 capsule 0  . chlorthalidone (HYGROTON) 50 MG tablet TAKE 1 TABLET EVERY DAY (Patient taking differently: Take 50 mg by mouth daily.) 90 tablet 3  . dexamethasone (DECADRON) 4 MG tablet Please take TWO tablets TWICE a day the day before, the day of, and the day after chemotherapy (Patient taking differently: Take 8 mg by mouth See admin instructions. Please take TWO tablets TWICE a day the day before, the day of, and the day after chemotherapy) 120 tablet 2  . famotidine (PEPCID) 20 MG tablet Take 20 mg by mouth daily.     Marland Kitchen FLUoxetine (PROZAC) 20 MG capsule TAKE 1 CAPSULE EVERY DAY (Patient taking differently: Take 20 mg by mouth daily.) 90 capsule 3  . fluticasone (FLONASE) 50 MCG/ACT nasal spray Place 1 spray  into both nostrils daily. 11.1 mL 0  . gabapentin (NEURONTIN) 100 MG capsule Take 1 capsule (100 mg total) by mouth 2 (two) times daily. 90 capsule 2  . HYDROcodone-acetaminophen (NORCO) 7.5-325 MG tablet Take 1 tablet by mouth every 6 (six) hours as needed for moderate pain. 60 tablet 0  . lidocaine-prilocaine (EMLA) cream Apply 1 application topically as needed. 30 g 1  . magnesium oxide (MAG-OX) 400 MG tablet Take 1 tablet (400 mg  total) by mouth daily. 30 tablet 1  . polyvinyl alcohol (LIQUIFILM TEARS) 1.4 % ophthalmic solution Place 1 drop into both eyes as needed for dry eyes.    . potassium chloride SA (KLOR-CON M20) 20 MEQ tablet Take 1 tablet (20 mEq total) by mouth 2 (two) times daily. 14 tablet 0  . tiotropium (SPIRIVA HANDIHALER) 18 MCG inhalation capsule Place 1 capsule (18 mcg total) into inhaler and inhale daily. 30 capsule 2   No current facility-administered medications for this visit.   Facility-Administered Medications Ordered in Other Visits  Medication Dose Route Frequency Provider Last Rate Last Admin  . sodium chloride flush (NS) 0.9 % injection 10 mL  10 mL Intracatheter PRN Curt Bears, MD   10 mL at 08/23/20 1117    SURGICAL HISTORY:  Past Surgical History:  Procedure Laterality Date  . ABDOMINAL HYSTERECTOMY    . CHOLECYSTECTOMY    . IR IMAGING GUIDED PORT INSERTION  02/24/2019  . ROTATOR CUFF REPAIR  3/04  . VIDEO BRONCHOSCOPY WITH ENDOBRONCHIAL ULTRASOUND Right 11/25/2018   Procedure: VIDEO BRONCHOSCOPY WITH ENDOBRONCHIAL ULTRASOUND;  Surgeon: Collene Gobble, MD;  Location: [facility] OR;  Service: Thoracic;  Laterality: Right;    REVIEW OF SYSTEMS:  A comprehensive review of systems was negative except for: Constitutional: positive for fatigue Gastrointestinal: positive for diarrhea   PHYSICAL EXAMINATION: General appearance: alert, cooperative, fatigued and no distress Head: Normocephalic, without obvious abnormality, atraumatic Neck: no adenopathy, no JVD, supple, symmetrical, trachea midline and thyroid not enlarged, symmetric, no tenderness/mass/nodules Lymph nodes: Cervical, supraclavicular, and axillary nodes normal. Resp: clear to auscultation bilaterally Back: symmetric, no curvature. ROM normal. No CVA tenderness. Cardio: regular rate and rhythm, S1, S2 normal, no murmur, click, rub or gallop GI: soft, non-tender; bowel sounds normal; no masses,  no organomegaly Extremities:  extremities normal, atraumatic, no cyanosis or edema  ECOG PERFORMANCE STATUS: 1 - Symptomatic but completely ambulatory  Blood pressure 133/86, pulse (!) 102, temperature (!) 97.4 F (36.3 C), temperature source Tympanic, resp. rate 18, height 5' 1"  (1.549 m), weight 140 lb 8 oz (63.7 kg), SpO2 99 %.  LABORATORY DATA: Lab Results  Component Value Date   WBC 9.4 08/16/2020   HGB 10.2 (L) 08/16/2020   HCT 32.1 (L) 08/16/2020   MCV 102.6 (H) 08/16/2020   PLT 137 (L) 08/16/2020      Chemistry      Component Value Date/Time   NA 141 08/16/2020 1150   NA 139 07/21/2017 0958   K 4.1 08/16/2020 1150   CL 104 08/16/2020 1150   CO2 29 08/16/2020 1150   BUN 20 08/16/2020 1150   BUN 26 07/21/2017 0958   CREATININE 0.79 08/16/2020 1150   CREATININE 0.79 09/03/2013 1540      Component Value Date/Time   CALCIUM 9.3 08/16/2020 1150   ALKPHOS 77 08/16/2020 1150   AST 14 (L) 08/16/2020 1150   ALT <6 08/16/2020 1150   BILITOT 0.5 08/16/2020 1150       RADIOGRAPHIC STUDIES: No results found.  ASSESSMENT AND PLAN: This is a very pleasant 75 years old African-American female with stage IV non-small cell carcinoma,, squamous cell carcinoma diagnosed in September 2020.  She presented with extensive right-sided pleural and thoracic nodal hypermetabolic disease with no extrathoracic disease. The patient started induction treatment with systemic chemotherapy with carboplatin, paclitaxel and Keytruda status post 4 cycles with partial response after cycle #4.  This was followed by 14 cycles of maintenance treatment with single agent Keytruda discontinued secondary to disease progression. The patient is currently undergoing second line systemic chemotherapy with docetaxel 75 mg/M2 and Cyramza 10 mg/KG every 3 weeks with Neulasta support.  Status post 11 cycles.  Starting from cycle #10 her dose of docetaxel was reduced to 65 Mg/M2. The patient continues to tolerate her treatment fairly well with no  concerning adverse effect except for occasional diarrhea improved with Imodium. I recommended for her to proceed with cycle #12 today as planned. She will come back for follow-up visit in 3 weeks for evaluation before the next cycle of her treatment. I will consider repeating her CT scan after cycle #13. For the history of deep venous thrombosis, she will continue her current treatment with Xarelto. She was advised to call immediately if she has any other concerning symptoms in the interval. The patient voices understanding of current disease status and treatment options and is in agreement with the current care plan. All questions were answered. The patient knows to call the clinic with any problems, questions or concerns. We can certainly see the patient much sooner if necessary.  Disclaimer: This note was dictated with voice recognition software. Similar sounding words can inadvertently be transcribed and may not be corrected upon review.

## 2020-08-25 ENCOUNTER — Inpatient Hospital Stay: Payer: Medicare HMO

## 2020-08-25 ENCOUNTER — Other Ambulatory Visit: Payer: Self-pay

## 2020-08-25 VITALS — BP 120/73 | HR 74 | Temp 98.8°F | Resp 16

## 2020-08-25 DIAGNOSIS — Z5111 Encounter for antineoplastic chemotherapy: Secondary | ICD-10-CM | POA: Diagnosis not present

## 2020-08-25 DIAGNOSIS — C3491 Malignant neoplasm of unspecified part of right bronchus or lung: Secondary | ICD-10-CM

## 2020-08-25 MED ORDER — PEGFILGRASTIM-JMDB 6 MG/0.6ML ~~LOC~~ SOSY
PREFILLED_SYRINGE | SUBCUTANEOUS | Status: AC
  Filled 2020-08-25: qty 0.6

## 2020-08-25 MED ORDER — PEGFILGRASTIM-JMDB 6 MG/0.6ML ~~LOC~~ SOSY
6.0000 mg | PREFILLED_SYRINGE | Freq: Once | SUBCUTANEOUS | Status: AC
  Administered 2020-08-25: 6 mg via SUBCUTANEOUS

## 2020-08-25 NOTE — Patient Instructions (Signed)
Pegfilgrastim injection What is this medicine? PEGFILGRASTIM (PEG fil gra stim) is a long-acting granulocyte colony-stimulating factor that stimulates the growth of neutrophils, a type of white blood cell important in the body's fight against infection. It is used to reduce the incidence of fever and infection in patients with certain types of cancer who are receiving chemotherapy that affects the bone marrow, and to increase survival after being exposed to high doses of radiation. This medicine may be used for other purposes; ask your health care provider or pharmacist if you have questions. COMMON BRAND NAME(S): Rexene Edison, Ziextenzo What should I tell my health care provider before I take this medicine? They need to know if you have any of these conditions:  kidney disease  latex allergy  ongoing radiation therapy  sickle cell disease  skin reactions to acrylic adhesives (On-Body Injector only)  an unusual or allergic reaction to pegfilgrastim, filgrastim, other medicines, foods, dyes, or preservatives  pregnant or trying to get pregnant  breast-feeding How should I use this medicine? This medicine is for injection under the skin. If you get this medicine at home, you will be taught how to prepare and give the pre-filled syringe or how to use the On-body Injector. Refer to the patient Instructions for Use for detailed instructions. Use exactly as directed. Tell your healthcare provider immediately if you suspect that the On-body Injector may not have performed as intended or if you suspect the use of the On-body Injector resulted in a missed or partial dose. It is important that you put your used needles and syringes in a special sharps container. Do not put them in a trash can. If you do not have a sharps container, call your pharmacist or healthcare provider to get one. Talk to your pediatrician regarding the use of this medicine in children. While this drug  may be prescribed for selected conditions, precautions do apply. Overdosage: If you think you have taken too much of this medicine contact a poison control center or emergency room at once. NOTE: This medicine is only for you. Do not share this medicine with others. What if I miss a dose? It is important not to miss your dose. Call your doctor or health care professional if you miss your dose. If you miss a dose due to an On-body Injector failure or leakage, a new dose should be administered as soon as possible using a single prefilled syringe for manual use. What may interact with this medicine? Interactions have not been studied. This list may not describe all possible interactions. Give your health care provider a list of all the medicines, herbs, non-prescription drugs, or dietary supplements you use. Also tell them if you smoke, drink alcohol, or use illegal drugs. Some items may interact with your medicine. What should I watch for while using this medicine? Your condition will be monitored carefully while you are receiving this medicine. You may need blood work done while you are taking this medicine. Talk to your health care provider about your risk of cancer. You may be more at risk for certain types of cancer if you take this medicine. If you are going to need a MRI, CT scan, or other procedure, tell your doctor that you are using this medicine (On-Body Injector only). What side effects may I notice from receiving this medicine? Side effects that you should report to your doctor or health care professional as soon as possible:  allergic reactions (skin rash, itching or hives, swelling of  the face, lips, or tongue)  back pain  dizziness  fever  pain, redness, or irritation at site where injected  pinpoint red spots on the skin  red or dark-brown urine  shortness of breath or breathing problems  stomach or side pain, or pain at the shoulder  swelling  tiredness  trouble  passing urine or change in the amount of urine  unusual bruising or bleeding Side effects that usually do not require medical attention (report to your doctor or health care professional if they continue or are bothersome):  bone pain  muscle pain This list may not describe all possible side effects. Call your doctor for medical advice about side effects. You may report side effects to FDA at 1-800-FDA-1088. Where should I keep my medicine? Keep out of the reach of children. If you are using this medicine at home, you will be instructed on how to store it. Throw away any unused medicine after the expiration date on the label. NOTE: This sheet is a summary. It may not cover all possible information. If you have questions about this medicine, talk to your doctor, pharmacist, or health care provider.  2021 Elsevier/Gold Standard (2019-04-02 13:20:51)

## 2020-09-01 ENCOUNTER — Other Ambulatory Visit: Payer: Self-pay | Admitting: Medical Oncology

## 2020-09-01 ENCOUNTER — Telehealth: Payer: Self-pay | Admitting: Medical Oncology

## 2020-09-01 ENCOUNTER — Other Ambulatory Visit: Payer: Self-pay

## 2020-09-01 ENCOUNTER — Inpatient Hospital Stay: Payer: Medicare HMO

## 2020-09-01 DIAGNOSIS — Z95828 Presence of other vascular implants and grafts: Secondary | ICD-10-CM

## 2020-09-01 DIAGNOSIS — Z5111 Encounter for antineoplastic chemotherapy: Secondary | ICD-10-CM | POA: Diagnosis not present

## 2020-09-01 DIAGNOSIS — C3491 Malignant neoplasm of unspecified part of right bronchus or lung: Secondary | ICD-10-CM

## 2020-09-01 DIAGNOSIS — E876 Hypokalemia: Secondary | ICD-10-CM

## 2020-09-01 LAB — CMP (CANCER CENTER ONLY)
ALT: 7 U/L (ref 0–44)
AST: 15 U/L (ref 15–41)
Albumin: 3.6 g/dL (ref 3.5–5.0)
Alkaline Phosphatase: 91 U/L (ref 38–126)
Anion gap: 9 (ref 5–15)
BUN: 27 mg/dL — ABNORMAL HIGH (ref 8–23)
CO2: 31 mmol/L (ref 22–32)
Calcium: 9.4 mg/dL (ref 8.9–10.3)
Chloride: 100 mmol/L (ref 98–111)
Creatinine: 0.84 mg/dL (ref 0.44–1.00)
GFR, Estimated: >60 mL/min (ref >60)
Glucose, Bld: 85 mg/dL (ref 70–99)
Potassium: 3.2 mmol/L — ABNORMAL LOW (ref 3.5–5.1)
Sodium: 140 mmol/L (ref 135–145)
Total Bilirubin: 0.8 mg/dL (ref 0.3–1.2)
Total Protein: 6.5 g/dL (ref 6.5–8.1)

## 2020-09-01 LAB — CBC WITH DIFFERENTIAL (CANCER CENTER ONLY)
Abs Immature Granulocytes: 1.06 10*3/uL — ABNORMAL HIGH (ref 0.00–0.07)
Basophils Absolute: 0 10*3/uL (ref 0.0–0.1)
Basophils Relative: 0 %
Eosinophils Absolute: 0 10*3/uL (ref 0.0–0.5)
Eosinophils Relative: 0 %
HCT: 29.7 % — ABNORMAL LOW (ref 36.0–46.0)
Hemoglobin: 10 g/dL — ABNORMAL LOW (ref 12.0–15.0)
Immature Granulocytes: 6 %
Lymphocytes Relative: 16 %
Lymphs Abs: 3.1 10*3/uL (ref 0.7–4.0)
MCH: 33.3 pg (ref 26.0–34.0)
MCHC: 33.7 g/dL (ref 30.0–36.0)
MCV: 99 fL (ref 80.0–100.0)
Monocytes Absolute: 1.4 10*3/uL — ABNORMAL HIGH (ref 0.1–1.0)
Monocytes Relative: 7 %
Neutro Abs: 13.6 10*3/uL — ABNORMAL HIGH (ref 1.7–7.7)
Neutrophils Relative %: 71 %
Platelet Count: 128 10*3/uL — ABNORMAL LOW (ref 150–400)
RBC: 3 MIL/uL — ABNORMAL LOW (ref 3.87–5.11)
RDW: 17.1 % — ABNORMAL HIGH (ref 11.5–15.5)
WBC Count: 19.2 10*3/uL — ABNORMAL HIGH (ref 4.0–10.5)
nRBC: 0.1 % (ref 0.0–0.2)

## 2020-09-01 MED ORDER — SODIUM CHLORIDE 0.9% FLUSH
10.0000 mL | INTRAVENOUS | Status: DC | PRN
Start: 2020-09-01 — End: 2020-09-01
  Administered 2020-09-01: 10 mL
  Filled 2020-09-01: qty 10

## 2020-09-01 MED ORDER — HEPARIN SOD (PORK) LOCK FLUSH 100 UNIT/ML IV SOLN
500.0000 [IU] | Freq: Once | INTRAVENOUS | Status: AC | PRN
  Administered 2020-09-01: 500 [IU]
  Filled 2020-09-01: qty 5

## 2020-09-01 MED ORDER — POTASSIUM CHLORIDE CRYS ER 20 MEQ PO TBCR: 20.0000 meq | EXTENDED_RELEASE_TABLET | Freq: Every day | ORAL | 0 refills | Status: DC

## 2020-09-01 NOTE — Telephone Encounter (Signed)
Pt notified to pick up and start [place]. RX sent to preferred pharmacy.

## 2020-09-01 NOTE — Telephone Encounter (Signed)
-----   Message from [employer], PA-C sent at 09/01/2020  1:48 PM EDT ----- Regarding: FW: Can you send 20 meq once daily to her pharmacy for 6 days. Can send to me to co-sign. ----- Message ----- From: Interface, Lab In [place] Sent: 09/01/2020  12:24 PM EDT To: Tobe Sos Heilingoetter, PA-C

## 2020-09-06 ENCOUNTER — Other Ambulatory Visit: Payer: Self-pay | Admitting: Student in an Organized Health Care Education/Training Program

## 2020-09-06 ENCOUNTER — Other Ambulatory Visit: Payer: Self-pay

## 2020-09-06 ENCOUNTER — Inpatient Hospital Stay: Payer: Medicare HMO

## 2020-09-06 DIAGNOSIS — C3491 Malignant neoplasm of unspecified part of right bronchus or lung: Secondary | ICD-10-CM

## 2020-09-06 DIAGNOSIS — I10 Essential (primary) hypertension: Secondary | ICD-10-CM

## 2020-09-06 DIAGNOSIS — Z95828 Presence of other vascular implants and grafts: Secondary | ICD-10-CM

## 2020-09-06 DIAGNOSIS — Z5111 Encounter for antineoplastic chemotherapy: Secondary | ICD-10-CM | POA: Diagnosis not present

## 2020-09-06 LAB — CBC WITH DIFFERENTIAL (CANCER CENTER ONLY)
Abs Immature Granulocytes: 0.13 10*3/uL — ABNORMAL HIGH (ref 0.00–0.07)
Basophils Absolute: 0 10*3/uL (ref 0.0–0.1)
Basophils Relative: 0 %
Eosinophils Absolute: 0 10*3/uL (ref 0.0–0.5)
Eosinophils Relative: 0 %
HCT: 30.7 % — ABNORMAL LOW (ref 36.0–46.0)
Hemoglobin: 10.1 g/dL — ABNORMAL LOW (ref 12.0–15.0)
Immature Granulocytes: 2 %
Lymphocytes Relative: 22 %
Lymphs Abs: 2 10*3/uL (ref 0.7–4.0)
MCH: 32.9 pg (ref 26.0–34.0)
MCHC: 32.9 g/dL (ref 30.0–36.0)
MCV: 100 fL (ref 80.0–100.0)
Monocytes Absolute: 0.4 10*3/uL (ref 0.1–1.0)
Monocytes Relative: 5 %
Neutro Abs: 6.4 10*3/uL (ref 1.7–7.7)
Neutrophils Relative %: 71 %
Platelet Count: 143 10*3/uL — ABNORMAL LOW (ref 150–400)
RBC: 3.07 MIL/uL — ABNORMAL LOW (ref 3.87–5.11)
RDW: 17.3 % — ABNORMAL HIGH (ref 11.5–15.5)
WBC Count: 9 10*3/uL (ref 4.0–10.5)
nRBC: 0 % (ref 0.0–0.2)

## 2020-09-06 LAB — CMP (CANCER CENTER ONLY)
ALT: 7 U/L (ref 0–44)
AST: 13 U/L — ABNORMAL LOW (ref 15–41)
Albumin: 3.6 g/dL (ref 3.5–5.0)
Alkaline Phosphatase: 86 U/L (ref 38–126)
Anion gap: 9 (ref 5–15)
BUN: 21 mg/dL (ref 8–23)
CO2: 28 mmol/L (ref 22–32)
Calcium: 9.3 mg/dL (ref 8.9–10.3)
Chloride: 102 mmol/L (ref 98–111)
Creatinine: 0.81 mg/dL (ref 0.44–1.00)
GFR, Estimated: >60 mL/min (ref >60)
Glucose, Bld: 87 mg/dL (ref 70–99)
Potassium: 3.4 mmol/L — ABNORMAL LOW (ref 3.5–5.1)
Sodium: 139 mmol/L (ref 135–145)
Total Bilirubin: 0.7 mg/dL (ref 0.3–1.2)
Total Protein: 6.5 g/dL (ref 6.5–8.1)

## 2020-09-06 MED ORDER — HEPARIN SOD (PORK) LOCK FLUSH 100 UNIT/ML IV SOLN
500.0000 [IU] | Freq: Once | INTRAVENOUS | Status: AC | PRN
  Administered 2020-09-06: 500 [IU]
  Filled 2020-09-06: qty 5

## 2020-09-06 MED ORDER — SODIUM CHLORIDE 0.9% FLUSH
10.0000 mL | INTRAVENOUS | Status: DC | PRN
  Administered 2020-09-06: 10 mL
  Filled 2020-09-06: qty 10

## 2020-09-08 ENCOUNTER — Other Ambulatory Visit: Payer: Medicare HMO

## 2020-09-13 ENCOUNTER — Inpatient Hospital Stay (HOSPITAL_BASED_OUTPATIENT_CLINIC_OR_DEPARTMENT_OTHER): Payer: Medicare HMO | Admitting: Internal Medicine

## 2020-09-13 ENCOUNTER — Inpatient Hospital Stay: Payer: Medicare HMO

## 2020-09-13 ENCOUNTER — Encounter: Payer: Self-pay | Admitting: Internal Medicine

## 2020-09-13 ENCOUNTER — Other Ambulatory Visit: Payer: Self-pay

## 2020-09-13 VITALS — BP 131/93 | HR 92 | Temp 97.4°F | Resp 22 | Ht 61.0 in | Wt 138.0 lb

## 2020-09-13 DIAGNOSIS — C3490 Malignant neoplasm of unspecified part of unspecified bronchus or lung: Secondary | ICD-10-CM

## 2020-09-13 DIAGNOSIS — C3491 Malignant neoplasm of unspecified part of right bronchus or lung: Secondary | ICD-10-CM

## 2020-09-13 DIAGNOSIS — Z95828 Presence of other vascular implants and grafts: Secondary | ICD-10-CM

## 2020-09-13 DIAGNOSIS — Z5111 Encounter for antineoplastic chemotherapy: Secondary | ICD-10-CM | POA: Diagnosis not present

## 2020-09-13 LAB — CBC WITH DIFFERENTIAL (CANCER CENTER ONLY)
Abs Immature Granulocytes: 0.04 10*3/uL (ref 0.00–0.07)
Basophils Absolute: 0 10*3/uL (ref 0.0–0.1)
Basophils Relative: 0 %
Eosinophils Absolute: 0 10*3/uL (ref 0.0–0.5)
Eosinophils Relative: 0 %
HCT: 29.9 % — ABNORMAL LOW (ref 36.0–46.0)
Hemoglobin: 10.2 g/dL — ABNORMAL LOW (ref 12.0–15.0)
Immature Granulocytes: 1 %
Lymphocytes Relative: 13 %
Lymphs Abs: 1 10*3/uL (ref 0.7–4.0)
MCH: 33.4 pg (ref 26.0–34.0)
MCHC: 34.1 g/dL (ref 30.0–36.0)
MCV: 98 fL (ref 80.0–100.0)
Monocytes Absolute: 0.2 10*3/uL (ref 0.1–1.0)
Monocytes Relative: 3 %
Neutro Abs: 6.2 10*3/uL (ref 1.7–7.7)
Neutrophils Relative %: 83 %
Platelet Count: 234 10*3/uL (ref 150–400)
RBC: 3.05 MIL/uL — ABNORMAL LOW (ref 3.87–5.11)
RDW: 17.3 % — ABNORMAL HIGH (ref 11.5–15.5)
WBC Count: 7.4 10*3/uL (ref 4.0–10.5)
nRBC: 0 % (ref 0.0–0.2)

## 2020-09-13 LAB — CMP (CANCER CENTER ONLY)
ALT: 9 U/L (ref 0–44)
AST: 15 U/L (ref 15–41)
Albumin: 3.8 g/dL (ref 3.5–5.0)
Alkaline Phosphatase: 55 U/L (ref 38–126)
Anion gap: 10 (ref 5–15)
BUN: 29 mg/dL — ABNORMAL HIGH (ref 8–23)
CO2: 28 mmol/L (ref 22–32)
Calcium: 9.7 mg/dL (ref 8.9–10.3)
Chloride: 100 mmol/L (ref 98–111)
Creatinine: 0.82 mg/dL (ref 0.44–1.00)
GFR, Estimated: >60 mL/min (ref >60)
Glucose, Bld: 110 mg/dL — ABNORMAL HIGH (ref 70–99)
Potassium: 3.5 mmol/L (ref 3.5–5.1)
Sodium: 138 mmol/L (ref 135–145)
Total Bilirubin: 0.6 mg/dL (ref 0.3–1.2)
Total Protein: 6.8 g/dL (ref 6.5–8.1)

## 2020-09-13 LAB — TOTAL PROTEIN, URINE DIPSTICK: Protein, ur: NEGATIVE mg/dL

## 2020-09-13 MED ORDER — SODIUM CHLORIDE 0.9 % IV SOLN
Freq: Once | INTRAVENOUS | Status: AC
  Filled 2020-09-13: qty 250

## 2020-09-13 MED ORDER — DIPHENHYDRAMINE HCL 50 MG/ML IJ SOLN
50.0000 mg | Freq: Once | INTRAMUSCULAR | Status: AC
  Administered 2020-09-13: 50 mg via INTRAVENOUS

## 2020-09-13 MED ORDER — SODIUM CHLORIDE 0.9% FLUSH
10.0000 mL | INTRAVENOUS | Status: DC | PRN
  Administered 2020-09-13: 10 mL
  Filled 2020-09-13: qty 10

## 2020-09-13 MED ORDER — DIPHENHYDRAMINE HCL 50 MG/ML IJ SOLN
INTRAMUSCULAR | Status: AC
  Filled 2020-09-13: qty 1

## 2020-09-13 MED ORDER — SODIUM CHLORIDE 0.9 % IV SOLN
65.0000 mg/m2 | Freq: Once | INTRAVENOUS | Status: AC
  Administered 2020-09-13: 110 mg via INTRAVENOUS
  Filled 2020-09-13: qty 11

## 2020-09-13 MED ORDER — ACETAMINOPHEN 325 MG PO TABS
ORAL_TABLET | ORAL | Status: AC
  Filled 2020-09-13: qty 2

## 2020-09-13 MED ORDER — DEXAMETHASONE SODIUM PHOSPHATE 100 MG/10ML IJ SOLN
10.0000 mg | Freq: Once | INTRAMUSCULAR | Status: AC
  Administered 2020-09-13: 10 mg via INTRAVENOUS
  Filled 2020-09-13: qty 10

## 2020-09-13 MED ORDER — SODIUM CHLORIDE 0.9 % IV SOLN
10.0000 mg/kg | Freq: Once | INTRAVENOUS | Status: AC
  Administered 2020-09-13: 600 mg via INTRAVENOUS
  Filled 2020-09-13: qty 50

## 2020-09-13 MED ORDER — HEPARIN SOD (PORK) LOCK FLUSH 100 UNIT/ML IV SOLN
500.0000 [IU] | Freq: Once | INTRAVENOUS | Status: AC | PRN
Start: 2020-09-13 — End: 2020-09-13
  Administered 2020-09-13: 500 [IU]
  Filled 2020-09-13: qty 5

## 2020-09-13 MED ORDER — ACETAMINOPHEN 325 MG PO TABS
650.0000 mg | ORAL_TABLET | Freq: Once | ORAL | Status: AC
Start: 2020-09-13 — End: 2020-09-13
  Administered 2020-09-13: 650 mg via ORAL

## 2020-09-13 NOTE — Progress Notes (Signed)
[facility] [phone]   [phone]  OFFICE PROGRESS NOTE  Debra Filler, MD [place] [address]  DIAGNOSIS: Stage IV non-small cell lung cancer, squamous cell carcinoma. She presented with right upper lobe lung mass in addition to pleural-based metastasis and mediastinal lymphadenopathy. She was diagnosed in September 2020.    PRIOR THERAPY: Chemotherapy with carboplatin for an AUC of 5, paclitaxel 175 mg/m, and Keytruda 200 mg IV every 3 weeks with Neulasta support. Last dose 12/08/19. Status post 18 cycles.  Starting from cycle #5 was on maintenance treatment with single agent Keytruda every 3 weeks. This was discontinued due to evidence of disease progression.    CURRENT THERAPY: Palliative systemic chemotherapy with docetaxel 75 mg/m2 and Cyramza 10 mg/kg IV every 3 weeks with Neulasta support. First dose on 01/04/20.  Status post 12 cycles.  Starting from cycle #10 docetaxel was reduced to 65 Mg/M2  INTERVAL HISTORY: Debra Rodgers 75 y.o. female returns to the clinic today for follow-up visit.  The patient is feeling fine today with no concerning complaints except for intermittent left-sided chest pain as well as shortness of breath with exertion.  The patient denied having any cough or hemoptysis.  She lost 3 pounds since her last visit.  She denied having any bleeding, bruises or ecchymosis.  She has no nausea, vomiting, diarrhea or constipation.  She has no headache or visual changes.  She continues to tolerate her treatment with docetaxel and Cyramza fairly well.  The patient is here today for evaluation before starting cycle #13.  MEDICAL HISTORY: Past Medical History:  Diagnosis Date   AAA (abdominal aortic aneurysm) ([facility])    COPD (chronic obstructive pulmonary disease) ([facility])    Depression    Essential hypertension    Headache    History of migraine headaches    lung ca dx'd 10/2018   Sleep apnea     Tobacco use disorder     ALLERGIES:  is allergic to codeine sulfate and pantoprazole sodium.  MEDICATIONS:  Current Outpatient Medications  Medication Sig Dispense Refill   acetaminophen (TYLENOL) 500 MG tablet Take 500 mg by mouth every 6 (six) hours as needed for moderate pain.     albuterol (VENTOLIN HFA) 108 (90 Base) MCG/ACT inhaler INHALE 2 PUFFS INTO THE LUNGS EVERY 6 (SIX) HOURS AS NEEDED FOR WHEEZING OR SHORTNESS OF BREATH. 4 each 0   amLODipine (NORVASC) 10 MG tablet TAKE 1 TABLET EVERY DAY 90 tablet 3   atorvastatin (LIPITOR) 20 MG tablet TAKE 1 TABLET EVERY DAY 90 tablet 3   benzonatate (TESSALON) 100 MG capsule TAKE 1 CAPSULE EVERY 8 HOURS AS NEEDED FOR COUGH (Patient taking differently: Take 100 mg by mouth 3 (three) times daily as needed for cough.) 90 capsule 0   chlorthalidone (HYGROTON) 50 MG tablet TAKE 1 TABLET EVERY DAY (Patient taking differently: Take 50 mg by mouth daily.) 90 tablet 3   dexamethasone (DECADRON) 4 MG tablet Please take TWO tablets TWICE a day the day before, the day of, and the day after chemotherapy (Patient taking differently: Take 8 mg by mouth See admin instructions. Please take TWO tablets TWICE a day the day before, the day of, and the day after chemotherapy) 120 tablet 2   famotidine (PEPCID) 20 MG tablet Take 20 mg by mouth daily.      FLUoxetine (PROZAC) 20 MG capsule TAKE 1 CAPSULE EVERY DAY (Patient taking differently: Take 20 mg  by mouth daily.) 90 capsule 3   gabapentin (NEURONTIN) 100 MG capsule Take 1 capsule (100 mg total) by mouth 2 (two) times daily. 90 capsule 2   HYDROcodone-acetaminophen (NORCO) 7.5-325 MG tablet Take 1 tablet by mouth every 6 (six) hours as needed for moderate pain. 60 tablet 0   lidocaine-prilocaine (EMLA) cream Apply 1 application topically as needed. 30 g 1   magnesium oxide (MAG-OX) 400 MG tablet Take 1 tablet (400 mg total) by mouth daily. 30 tablet 1   polyvinyl alcohol (LIQUIFILM TEARS) 1.4 % ophthalmic  solution Place 1 drop into both eyes as needed for dry eyes.     potassium chloride SA (KLOR-CON) 20 MEQ tablet Take 1 tablet (20 mEq total) by mouth daily. 6 tablet 0   tiotropium (SPIRIVA HANDIHALER) 18 MCG inhalation capsule Place 1 capsule (18 mcg total) into inhaler and inhale daily. 30 capsule 2   fluticasone (FLONASE) 50 MCG/ACT nasal spray Place 1 spray into both nostrils daily. 11.1 mL 0   No current facility-administered medications for this visit.    SURGICAL HISTORY:  Past Surgical History:  Procedure Laterality Date   ABDOMINAL HYSTERECTOMY     CHOLECYSTECTOMY     IR IMAGING GUIDED PORT INSERTION  02/24/2019   ROTATOR CUFF REPAIR  3/04   VIDEO BRONCHOSCOPY WITH ENDOBRONCHIAL ULTRASOUND Right 11/25/2018   Procedure: VIDEO BRONCHOSCOPY WITH ENDOBRONCHIAL ULTRASOUND;  Surgeon: Collene Gobble, MD;  Location: [facility] OR;  Service: Thoracic;  Laterality: Right;    REVIEW OF SYSTEMS:  A comprehensive review of systems was negative except for: Constitutional: positive for fatigue Respiratory: positive for dyspnea on exertion and pleurisy/chest pain   PHYSICAL EXAMINATION: General appearance: alert, cooperative, fatigued, and no distress Head: Normocephalic, without obvious abnormality, atraumatic Neck: no adenopathy, no JVD, supple, symmetrical, trachea midline, and thyroid not enlarged, symmetric, no tenderness/mass/nodules Lymph nodes: Cervical, supraclavicular, and axillary nodes normal. Resp: clear to auscultation bilaterally Back: symmetric, no curvature. ROM normal. No CVA tenderness. Cardio: regular rate and rhythm, S1, S2 normal, no murmur, click, rub or gallop GI: soft, non-tender; bowel sounds normal; no masses,  no organomegaly Extremities: extremities normal, atraumatic, no cyanosis or edema  ECOG PERFORMANCE STATUS: 1 - Symptomatic but completely ambulatory  Blood pressure (!) 131/93, pulse 92, temperature (!) 97.4 F (36.3 C), temperature source Tympanic, resp. rate (!)  22, height 5' 1"  (1.549 m), weight 138 lb (62.6 kg), SpO2 100 %.  LABORATORY DATA: Lab Results  Component Value Date   WBC 9.0 09/06/2020   HGB 10.1 (L) 09/06/2020   HCT 30.7 (L) 09/06/2020   MCV 100.0 09/06/2020   PLT 143 (L) 09/06/2020      Chemistry      Component Value Date/Time   NA 139 09/06/2020 1039   NA 139 07/21/2017 0958   K 3.4 (L) 09/06/2020 1039   CL 102 09/06/2020 1039   CO2 28 09/06/2020 1039   BUN 21 09/06/2020 1039   BUN 26 07/21/2017 0958   CREATININE 0.81 09/06/2020 1039   CREATININE 0.79 09/03/2013 1540      Component Value Date/Time   CALCIUM 9.3 09/06/2020 1039   ALKPHOS 86 09/06/2020 1039   AST 13 (L) 09/06/2020 1039   ALT 7 09/06/2020 1039   BILITOT 0.7 09/06/2020 1039       RADIOGRAPHIC STUDIES: No results found.  ASSESSMENT AND PLAN: This is a very pleasant 75 years old African-American female with stage IV non-small cell carcinoma,, squamous cell carcinoma diagnosed in September 2020.  She presented with extensive right-sided pleural and thoracic nodal hypermetabolic disease with no extrathoracic disease. The patient started induction treatment with systemic chemotherapy with carboplatin, paclitaxel and Keytruda status post 4 cycles with partial response after cycle #4.  This was followed by 14 cycles of maintenance treatment with single agent Keytruda discontinued secondary to disease progression. The patient is currently undergoing second line systemic chemotherapy with docetaxel 75 mg/M2 and Cyramza 10 mg/KG every 3 weeks with Neulasta support.  Status post 12 cycles.  Starting from cycle #10 her dose of docetaxel was reduced to 65 Mg/M2. The patient has been tolerating this treatment well with no concerning adverse effects. I recommended for her to proceed with cycle #13 today as planned. She will come back for follow-up visit in 3 weeks for evaluation with repeat CT scan of the chest, abdomen pelvis for restaging of her disease. The patient  was advised to call immediately if she has any other concerning symptoms in the interval. For the history of deep venous thrombosis, she will continue her current treatment with Xarelto.  The patient voices understanding of current disease status and treatment options and is in agreement with the current care plan. All questions were answered. The patient knows to call the clinic with any problems, questions or concerns. We can certainly see the patient much sooner if necessary.  Disclaimer: This note was dictated with voice recognition software. Similar sounding words can inadvertently be transcribed and may not be corrected upon review.

## 2020-09-13 NOTE — Patient Instructions (Signed)
[facility] Discharge Instructions for Patients Receiving Chemotherapy  Today you received the following chemotherapy agents ramacirumab, docetaxel  To help prevent nausea and vomiting after your treatment, we encourage you to take your nausea medication as directed.   If you develop nausea and vomiting that is not controlled by your nausea medication, call the clinic.   BELOW ARE SYMPTOMS THAT SHOULD BE REPORTED IMMEDIATELY: *FEVER GREATER THAN 100.5 F *CHILLS WITH OR WITHOUT FEVER NAUSEA AND VOMITING THAT IS NOT CONTROLLED WITH YOUR NAUSEA MEDICATION *UNUSUAL SHORTNESS OF BREATH *UNUSUAL BRUISING OR BLEEDING TENDERNESS IN MOUTH AND THROAT WITH OR WITHOUT PRESENCE OF ULCERS *URINARY PROBLEMS *BOWEL PROBLEMS UNUSUAL RASH Items with * indicate a potential emergency and should be followed up as soon as possible.  Feel free to call the clinic should you have any questions or concerns. The clinic phone number is [phone].  Please show the [place] at check-in to the Emergency Department and triage nurse.

## 2020-09-13 NOTE — Progress Notes (Signed)
Per Dr. Julien Nordmann, can release premeds with pending CMP.

## 2020-09-15 ENCOUNTER — Other Ambulatory Visit: Payer: Self-pay

## 2020-09-15 ENCOUNTER — Inpatient Hospital Stay: Payer: Medicare HMO

## 2020-09-15 VITALS — BP 131/86 | HR 80 | Temp 98.7°F | Resp 18

## 2020-09-15 DIAGNOSIS — Z5111 Encounter for antineoplastic chemotherapy: Secondary | ICD-10-CM | POA: Diagnosis not present

## 2020-09-15 DIAGNOSIS — C3491 Malignant neoplasm of unspecified part of right bronchus or lung: Secondary | ICD-10-CM

## 2020-09-15 MED ORDER — PEGFILGRASTIM-JMDB 6 MG/0.6ML ~~LOC~~ SOSY
6.0000 mg | PREFILLED_SYRINGE | Freq: Once | SUBCUTANEOUS | Status: AC
  Administered 2020-09-15: 6 mg via SUBCUTANEOUS

## 2020-09-15 MED ORDER — PEGFILGRASTIM-JMDB 6 MG/0.6ML ~~LOC~~ SOSY
PREFILLED_SYRINGE | SUBCUTANEOUS | Status: AC
  Filled 2020-09-15: qty 0.6

## 2020-09-15 NOTE — Patient Instructions (Signed)
Pegfilgrastim injection What is this medication? PEGFILGRASTIM (PEG fil gra stim) is a long-acting granulocyte colony-stimulating factor that stimulates the growth of neutrophils, a type of white blood cell important in the body's fight against infection. It is used to reduce the incidence of fever and infection in patients with certain types of cancer who are receiving chemotherapy that affects the bone marrow, and toincrease survival after being exposed to high doses of radiation. This medicine may be used for other purposes; ask your health care provider orpharmacist if you have questions. COMMON BRAND NAME(S): Rexene Edison, Ziextenzo What should I tell my care team before I take this medication? They need to know if you have any of these conditions: kidney disease latex allergy ongoing radiation therapy sickle cell disease skin reactions to acrylic adhesives (On-Body Injector only) an unusual or allergic reaction to pegfilgrastim, filgrastim, other medicines, foods, dyes, or preservatives pregnant or trying to get pregnant breast-feeding How should I use this medication? This medicine is for injection under the skin. If you get this medicine at home, you will be taught how to prepare and give the pre-filled syringe or how to use the On-body Injector. Refer to the patient Instructions for Use for detailed instructions. Use exactly as directed. Tell your healthcare provider immediately if you suspect that the On-body Injector may not have performed as intended or if you suspect the use of the On-body Injector resulted in a missedor partial dose. It is important that you put your used needles and syringes in a special sharps container. Do not put them in a trash can. If you do not have a sharpscontainer, call your pharmacist or healthcare provider to get one. Talk to your pediatrician regarding the use of this medicine in children. Whilethis drug may be prescribed for  selected conditions, precautions do apply. Overdosage: If you think you have taken too much of this medicine contact apoison control center or emergency room at once. NOTE: This medicine is only for you. Do not share this medicine with others. What if I miss a dose? It is important not to miss your dose. Call your doctor or health care professional if you miss your dose. If you miss a dose due to an On-body Injector failure or leakage, a new dose should be administered as soon aspossible using a single prefilled syringe for manual use. What may interact with this medication? Interactions have not been studied. This list may not describe all possible interactions. Give your health care provider a list of all the medicines, herbs, non-prescription drugs, or dietary supplements you use. Also tell them if you smoke, drink alcohol, or use illegaldrugs. Some items may interact with your medicine. What should I watch for while using this medication? Your condition will be monitored carefully while you are receiving thismedicine. You may need blood work done while you are taking this medicine. Talk to your health care provider about your risk of cancer. You may be more atrisk for certain types of cancer if you take this medicine. If you are going to need a MRI, CT scan, or other procedure, tell your doctorthat you are using this medicine (On-Body Injector only). What side effects may I notice from receiving this medication? Side effects that you should report to your doctor or health care professionalas soon as possible: allergic reactions (skin rash, itching or hives, swelling of the face, lips, or tongue) back pain dizziness fever pain, redness, or irritation at site where injected pinpoint red spots on the  skin red or dark-brown urine shortness of breath or breathing problems stomach or side pain, or pain at the shoulder swelling tiredness trouble passing urine or change in the amount of  urine unusual bruising or bleeding Side effects that usually do not require medical attention (report to yourdoctor or health care professional if they continue or are bothersome): bone pain muscle pain This list may not describe all possible side effects. Call your doctor for medical advice about side effects. You may report side effects to FDA at1-800-FDA-1088. Where should I keep my medication? Keep out of the reach of children. If you are using this medicine at home, you will be instructed on how to storeit. Throw away any unused medicine after the expiration date on the label. NOTE: This sheet is a summary. It may not cover all possible information. If you have questions about this medicine, talk to your doctor, pharmacist, orhealth care provider.  2022 Elsevier/Gold Standard (2020-04-07 11:54:14)

## 2020-09-18 ENCOUNTER — Other Ambulatory Visit: Payer: Medicare HMO

## 2020-09-20 ENCOUNTER — Other Ambulatory Visit: Payer: Self-pay | Admitting: Physician Assistant

## 2020-09-20 ENCOUNTER — Other Ambulatory Visit: Payer: Self-pay

## 2020-09-20 ENCOUNTER — Inpatient Hospital Stay: Payer: Medicare HMO

## 2020-09-20 DIAGNOSIS — E876 Hypokalemia: Secondary | ICD-10-CM

## 2020-09-20 DIAGNOSIS — C3491 Malignant neoplasm of unspecified part of right bronchus or lung: Secondary | ICD-10-CM

## 2020-09-20 DIAGNOSIS — Z95828 Presence of other vascular implants and grafts: Secondary | ICD-10-CM

## 2020-09-20 DIAGNOSIS — Z5111 Encounter for antineoplastic chemotherapy: Secondary | ICD-10-CM | POA: Diagnosis not present

## 2020-09-20 LAB — CMP (CANCER CENTER ONLY)
ALT: 8 U/L (ref 0–44)
AST: 13 U/L — ABNORMAL LOW (ref 15–41)
Albumin: 3.5 g/dL (ref 3.5–5.0)
Alkaline Phosphatase: 78 U/L (ref 38–126)
Anion gap: 11 (ref 5–15)
BUN: 28 mg/dL — ABNORMAL HIGH (ref 8–23)
CO2: 31 mmol/L (ref 22–32)
Calcium: 9.5 mg/dL (ref 8.9–10.3)
Chloride: 98 mmol/L (ref 98–111)
Creatinine: 0.8 mg/dL (ref 0.44–1.00)
GFR, Estimated: >60 mL/min (ref >60)
Glucose, Bld: 81 mg/dL (ref 70–99)
Potassium: 2.9 mmol/L — ABNORMAL LOW (ref 3.5–5.1)
Sodium: 140 mmol/L (ref 135–145)
Total Bilirubin: 1.1 mg/dL (ref 0.3–1.2)
Total Protein: 6.4 g/dL — ABNORMAL LOW (ref 6.5–8.1)

## 2020-09-20 LAB — CBC WITH DIFFERENTIAL (CANCER CENTER ONLY)
Abs Immature Granulocytes: 0.19 10*3/uL — ABNORMAL HIGH (ref 0.00–0.07)
Basophils Absolute: 0.1 10*3/uL (ref 0.0–0.1)
Basophils Relative: 1 %
Eosinophils Absolute: 0 10*3/uL (ref 0.0–0.5)
Eosinophils Relative: 0 %
HCT: 31.1 % — ABNORMAL LOW (ref 36.0–46.0)
Hemoglobin: 10.2 g/dL — ABNORMAL LOW (ref 12.0–15.0)
Immature Granulocytes: 3 %
Lymphocytes Relative: 28 %
Lymphs Abs: 2.1 10*3/uL (ref 0.7–4.0)
MCH: 32.6 pg (ref 26.0–34.0)
MCHC: 32.8 g/dL (ref 30.0–36.0)
MCV: 99.4 fL (ref 80.0–100.0)
Monocytes Absolute: 0.9 10*3/uL (ref 0.1–1.0)
Monocytes Relative: 12 %
Neutro Abs: 4.2 10*3/uL (ref 1.7–7.7)
Neutrophils Relative %: 56 %
Platelet Count: 119 10*3/uL — ABNORMAL LOW (ref 150–400)
RBC: 3.13 MIL/uL — ABNORMAL LOW (ref 3.87–5.11)
RDW: 17.2 % — ABNORMAL HIGH (ref 11.5–15.5)
WBC Count: 7.5 10*3/uL (ref 4.0–10.5)
nRBC: 0 % (ref 0.0–0.2)

## 2020-09-20 MED ORDER — POTASSIUM CHLORIDE CRYS ER 20 MEQ PO TBCR: 20.0000 meq | EXTENDED_RELEASE_TABLET | Freq: Two times a day (BID) | ORAL | 0 refills | Status: DC

## 2020-09-20 MED ORDER — SODIUM CHLORIDE 0.9% FLUSH
10.0000 mL | INTRAVENOUS | Status: DC | PRN
  Administered 2020-09-20: 10 mL
  Filled 2020-09-20: qty 10

## 2020-09-20 MED ORDER — HEPARIN SOD (PORK) LOCK FLUSH 100 UNIT/ML IV SOLN
500.0000 [IU] | Freq: Once | INTRAVENOUS | Status: AC | PRN
  Administered 2020-09-20: 500 [IU]
  Filled 2020-09-20: qty 5

## 2020-09-20 NOTE — Progress Notes (Signed)
I called the patient to let her know I sent a prescription for potassium to her pharmacy. She is aware and will pick it up.

## 2020-09-26 ENCOUNTER — Other Ambulatory Visit: Payer: Medicare HMO

## 2020-09-27 ENCOUNTER — Inpatient Hospital Stay: Payer: Medicare HMO | Attending: Internal Medicine

## 2020-09-27 ENCOUNTER — Other Ambulatory Visit: Payer: Self-pay | Admitting: Physician Assistant

## 2020-09-27 ENCOUNTER — Other Ambulatory Visit: Payer: Self-pay

## 2020-09-27 DIAGNOSIS — C782 Secondary malignant neoplasm of pleura: Secondary | ICD-10-CM | POA: Insufficient documentation

## 2020-09-27 DIAGNOSIS — J449 Chronic obstructive pulmonary disease, unspecified: Secondary | ICD-10-CM | POA: Insufficient documentation

## 2020-09-27 DIAGNOSIS — Z95828 Presence of other vascular implants and grafts: Secondary | ICD-10-CM

## 2020-09-27 DIAGNOSIS — I10 Essential (primary) hypertension: Secondary | ICD-10-CM | POA: Insufficient documentation

## 2020-09-27 DIAGNOSIS — I714 Abdominal aortic aneurysm, without rupture: Secondary | ICD-10-CM | POA: Insufficient documentation

## 2020-09-27 DIAGNOSIS — Z5111 Encounter for antineoplastic chemotherapy: Secondary | ICD-10-CM | POA: Diagnosis present

## 2020-09-27 DIAGNOSIS — C3491 Malignant neoplasm of unspecified part of right bronchus or lung: Secondary | ICD-10-CM

## 2020-09-27 DIAGNOSIS — G4730 Sleep apnea, unspecified: Secondary | ICD-10-CM | POA: Diagnosis not present

## 2020-09-27 DIAGNOSIS — E876 Hypokalemia: Secondary | ICD-10-CM

## 2020-09-27 DIAGNOSIS — C3411 Malignant neoplasm of upper lobe, right bronchus or lung: Secondary | ICD-10-CM | POA: Insufficient documentation

## 2020-09-27 LAB — CMP (CANCER CENTER ONLY)
ALT: <6 U/L (ref 0–44)
AST: 14 U/L — ABNORMAL LOW (ref 15–41)
Albumin: 3.6 g/dL (ref 3.5–5.0)
Alkaline Phosphatase: 88 U/L (ref 38–126)
Anion gap: 7 (ref 5–15)
BUN: 24 mg/dL — ABNORMAL HIGH (ref 8–23)
CO2: 30 mmol/L (ref 22–32)
Calcium: 9.8 mg/dL (ref 8.9–10.3)
Chloride: 103 mmol/L (ref 98–111)
Creatinine: 0.77 mg/dL (ref 0.44–1.00)
GFR, Estimated: >60 mL/min (ref >60)
Glucose, Bld: 89 mg/dL (ref 70–99)
Potassium: 3.2 mmol/L — ABNORMAL LOW (ref 3.5–5.1)
Sodium: 140 mmol/L (ref 135–145)
Total Bilirubin: 0.7 mg/dL (ref 0.3–1.2)
Total Protein: 6.6 g/dL (ref 6.5–8.1)

## 2020-09-27 LAB — CBC WITH DIFFERENTIAL (CANCER CENTER ONLY)
Abs Immature Granulocytes: 0.16 10*3/uL — ABNORMAL HIGH (ref 0.00–0.07)
Basophils Absolute: 0 10*3/uL (ref 0.0–0.1)
Basophils Relative: 0 %
Eosinophils Absolute: 0 10*3/uL (ref 0.0–0.5)
Eosinophils Relative: 0 %
HCT: 32.6 % — ABNORMAL LOW (ref 36.0–46.0)
Hemoglobin: 10.5 g/dL — ABNORMAL LOW (ref 12.0–15.0)
Immature Granulocytes: 2 %
Lymphocytes Relative: 19 %
Lymphs Abs: 2 10*3/uL (ref 0.7–4.0)
MCH: 32.4 pg (ref 26.0–34.0)
MCHC: 32.2 g/dL (ref 30.0–36.0)
MCV: 100.6 fL — ABNORMAL HIGH (ref 80.0–100.0)
Monocytes Absolute: 0.4 10*3/uL (ref 0.1–1.0)
Monocytes Relative: 4 %
Neutro Abs: 8 10*3/uL — ABNORMAL HIGH (ref 1.7–7.7)
Neutrophils Relative %: 75 %
Platelet Count: 148 10*3/uL — ABNORMAL LOW (ref 150–400)
RBC: 3.24 MIL/uL — ABNORMAL LOW (ref 3.87–5.11)
RDW: 17.4 % — ABNORMAL HIGH (ref 11.5–15.5)
WBC Count: 10.7 10*3/uL — ABNORMAL HIGH (ref 4.0–10.5)
nRBC: 0 % (ref 0.0–0.2)

## 2020-09-27 LAB — TOTAL PROTEIN, URINE DIPSTICK: Protein, ur: NEGATIVE mg/dL

## 2020-09-27 MED ORDER — POTASSIUM CHLORIDE CRYS ER 20 MEQ PO TBCR: 20.0000 meq | EXTENDED_RELEASE_TABLET | Freq: Every day | ORAL | 0 refills | Status: DC

## 2020-09-27 MED ORDER — SODIUM CHLORIDE 0.9% FLUSH
10.0000 mL | INTRAVENOUS | Status: DC | PRN
  Administered 2020-09-27: 10 mL
  Filled 2020-09-27: qty 10

## 2020-09-27 MED ORDER — HEPARIN SOD (PORK) LOCK FLUSH 100 UNIT/ML IV SOLN
500.0000 [IU] | Freq: Once | INTRAVENOUS | Status: AC | PRN
  Administered 2020-09-27: 500 [IU]
  Filled 2020-09-27: qty 5

## 2020-09-27 NOTE — Progress Notes (Signed)
I called the patient and let her know I was sending potassium supplements to the pharmacy.

## 2020-10-02 ENCOUNTER — Other Ambulatory Visit: Payer: Medicare HMO

## 2020-10-03 ENCOUNTER — Ambulatory Visit (HOSPITAL_COMMUNITY): Payer: Medicare HMO

## 2020-10-04 ENCOUNTER — Inpatient Hospital Stay (HOSPITAL_BASED_OUTPATIENT_CLINIC_OR_DEPARTMENT_OTHER): Payer: Medicare HMO | Admitting: Internal Medicine

## 2020-10-04 ENCOUNTER — Inpatient Hospital Stay: Payer: Medicare HMO

## 2020-10-04 ENCOUNTER — Other Ambulatory Visit: Payer: Self-pay

## 2020-10-04 VITALS — BP 139/79 | HR 91 | Temp 97.1°F | Resp 20 | Ht 61.0 in | Wt 138.4 lb

## 2020-10-04 DIAGNOSIS — C3491 Malignant neoplasm of unspecified part of right bronchus or lung: Secondary | ICD-10-CM | POA: Diagnosis not present

## 2020-10-04 DIAGNOSIS — Z5111 Encounter for antineoplastic chemotherapy: Secondary | ICD-10-CM | POA: Diagnosis not present

## 2020-10-04 DIAGNOSIS — Z95828 Presence of other vascular implants and grafts: Secondary | ICD-10-CM

## 2020-10-04 LAB — CBC WITH DIFFERENTIAL (CANCER CENTER ONLY)
Abs Immature Granulocytes: 0.13 10*3/uL — ABNORMAL HIGH (ref 0.00–0.07)
Basophils Absolute: 0 10*3/uL (ref 0.0–0.1)
Basophils Relative: 0 %
Eosinophils Absolute: 0 10*3/uL (ref 0.0–0.5)
Eosinophils Relative: 0 %
HCT: 32.7 % — ABNORMAL LOW (ref 36.0–46.0)
Hemoglobin: 10.7 g/dL — ABNORMAL LOW (ref 12.0–15.0)
Immature Granulocytes: 1 %
Lymphocytes Relative: 12 %
Lymphs Abs: 1.1 10*3/uL (ref 0.7–4.0)
MCH: 33 pg (ref 26.0–34.0)
MCHC: 32.7 g/dL (ref 30.0–36.0)
MCV: 100.9 fL — ABNORMAL HIGH (ref 80.0–100.0)
Monocytes Absolute: 0.3 10*3/uL (ref 0.1–1.0)
Monocytes Relative: 3 %
Neutro Abs: 8.3 10*3/uL — ABNORMAL HIGH (ref 1.7–7.7)
Neutrophils Relative %: 84 %
Platelet Count: 222 10*3/uL (ref 150–400)
RBC: 3.24 MIL/uL — ABNORMAL LOW (ref 3.87–5.11)
RDW: 17.2 % — ABNORMAL HIGH (ref 11.5–15.5)
WBC Count: 9.8 10*3/uL (ref 4.0–10.5)
nRBC: 0 % (ref 0.0–0.2)

## 2020-10-04 LAB — TSH: TSH: 1.079 u[IU]/mL (ref 0.308–3.960)

## 2020-10-04 LAB — CMP (CANCER CENTER ONLY)
ALT: 8 U/L (ref 0–44)
AST: 13 U/L — ABNORMAL LOW (ref 15–41)
Albumin: 3.8 g/dL (ref 3.5–5.0)
Alkaline Phosphatase: 63 U/L (ref 38–126)
Anion gap: 9 (ref 5–15)
BUN: 24 mg/dL — ABNORMAL HIGH (ref 8–23)
CO2: 28 mmol/L (ref 22–32)
Calcium: 9.8 mg/dL (ref 8.9–10.3)
Chloride: 103 mmol/L (ref 98–111)
Creatinine: 0.89 mg/dL (ref 0.44–1.00)
GFR, Estimated: >60 mL/min (ref >60)
Glucose, Bld: 129 mg/dL — ABNORMAL HIGH (ref 70–99)
Potassium: 3.6 mmol/L (ref 3.5–5.1)
Sodium: 140 mmol/L (ref 135–145)
Total Bilirubin: 0.7 mg/dL (ref 0.3–1.2)
Total Protein: 7 g/dL (ref 6.5–8.1)

## 2020-10-04 MED ORDER — SODIUM CHLORIDE 0.9 % IV SOLN
65.0000 mg/m2 | Freq: Once | INTRAVENOUS | Status: AC
  Administered 2020-10-04: 110 mg via INTRAVENOUS
  Filled 2020-10-04: qty 11

## 2020-10-04 MED ORDER — SODIUM CHLORIDE 0.9% FLUSH
10.0000 mL | INTRAVENOUS | Status: DC | PRN
  Administered 2020-10-04: 10 mL
  Filled 2020-10-04: qty 10

## 2020-10-04 MED ORDER — ACETAMINOPHEN 325 MG PO TABS
ORAL_TABLET | ORAL | Status: AC
  Filled 2020-10-04: qty 2

## 2020-10-04 MED ORDER — ACETAMINOPHEN 325 MG PO TABS
650.0000 mg | ORAL_TABLET | Freq: Once | ORAL | Status: AC
  Administered 2020-10-04: 650 mg via ORAL

## 2020-10-04 MED ORDER — DIPHENHYDRAMINE HCL 50 MG/ML IJ SOLN
INTRAMUSCULAR | Status: AC
  Filled 2020-10-04: qty 1

## 2020-10-04 MED ORDER — SODIUM CHLORIDE 0.9 % IV SOLN
Freq: Once | INTRAVENOUS | Status: AC
  Filled 2020-10-04: qty 250

## 2020-10-04 MED ORDER — SODIUM CHLORIDE 0.9 % IV SOLN
10.0000 mg/kg | Freq: Once | INTRAVENOUS | Status: AC
  Administered 2020-10-04: 600 mg via INTRAVENOUS
  Filled 2020-10-04: qty 50

## 2020-10-04 MED ORDER — HEPARIN SOD (PORK) LOCK FLUSH 100 UNIT/ML IV SOLN
500.0000 [IU] | Freq: Once | INTRAVENOUS | Status: AC | PRN
  Administered 2020-10-04: 500 [IU]
  Filled 2020-10-04: qty 5

## 2020-10-04 MED ORDER — SODIUM CHLORIDE 0.9 % IV SOLN
10.0000 mg | Freq: Once | INTRAVENOUS | Status: AC
  Administered 2020-10-04: 10 mg via INTRAVENOUS
  Filled 2020-10-04: qty 10

## 2020-10-04 MED ORDER — DIPHENHYDRAMINE HCL 50 MG/ML IJ SOLN
50.0000 mg | Freq: Once | INTRAMUSCULAR | Status: AC
  Administered 2020-10-04: 50 mg via INTRAVENOUS

## 2020-10-04 NOTE — Progress Notes (Signed)
[facility] [phone]   [phone]  OFFICE PROGRESS NOTE  Debra Filler, MD [place] [address]  DIAGNOSIS: Stage IV non-small cell lung cancer, squamous cell carcinoma. She presented with right upper lobe lung mass in addition to pleural-based metastasis and mediastinal lymphadenopathy. She was diagnosed in September 2020.    PRIOR THERAPY: Chemotherapy with carboplatin for an AUC of 5, paclitaxel 175 mg/m, and Keytruda 200 mg IV every 3 weeks with Neulasta support. Last dose 12/08/19. Status post 18 cycles.  Starting from cycle #5 was on maintenance treatment with single agent Keytruda every 3 weeks. This was discontinued due to evidence of disease progression.    CURRENT THERAPY: Palliative systemic chemotherapy with docetaxel 75 mg/m2 and Cyramza 10 mg/kg IV every 3 weeks with Neulasta support. First dose on 01/04/20.  Status post 13 cycles.  Starting from cycle #10 docetaxel was reduced to 65 Mg/M2  INTERVAL HISTORY: Debra Rodgers 75 y.o. female returns to the clinic today for follow-up visit.  The patient is feeling fine today with no concerning complaints except for inability to raise her left arm above the shoulder secondary to rotator cuff problem.  She denied having any current chest pain, shortness of breath, cough or hemoptysis.  She denied having any nausea, vomiting, diarrhea or constipation.  She has no headache or visual changes.  She has no significant peripheral neuropathy.  She was supposed to have repeat CT scan of the chest, abdomen and pelvis before this visit but unfortunately it was delayed to next week because of insurance preauthorization delay.  The patient is here today for evaluation before starting cycle #14 of her treatment.  MEDICAL HISTORY: Past Medical History:  Diagnosis Date   AAA (abdominal aortic aneurysm) (HCC)    COPD (chronic obstructive pulmonary disease) ([facility])    Depression     Essential hypertension    Headache    History of migraine headaches    lung ca dx'd 10/2018   Sleep apnea    Tobacco use disorder     ALLERGIES:  is allergic to codeine sulfate and pantoprazole sodium.  MEDICATIONS:  Current Outpatient Medications  Medication Sig Dispense Refill   acetaminophen (TYLENOL) 500 MG tablet Take 500 mg by mouth every 6 (six) hours as needed for moderate pain.     albuterol (VENTOLIN HFA) 108 (90 Base) MCG/ACT inhaler INHALE 2 PUFFS INTO THE LUNGS EVERY 6 (SIX) HOURS AS NEEDED FOR WHEEZING OR SHORTNESS OF BREATH. 4 each 0   amLODipine (NORVASC) 10 MG tablet TAKE 1 TABLET EVERY DAY 90 tablet 3   atorvastatin (LIPITOR) 20 MG tablet TAKE 1 TABLET EVERY DAY 90 tablet 3   benzonatate (TESSALON) 100 MG capsule TAKE 1 CAPSULE EVERY 8 HOURS AS NEEDED FOR COUGH (Patient taking differently: Take 100 mg by mouth 3 (three) times daily as needed for cough.) 90 capsule 0   chlorthalidone (HYGROTON) 50 MG tablet TAKE 1 TABLET EVERY DAY (Patient taking differently: Take 50 mg by mouth daily.) 90 tablet 3   dexamethasone (DECADRON) 4 MG tablet Please take TWO tablets TWICE a day the day before, the day of, and the day after chemotherapy (Patient taking differently: Take 8 mg by mouth See admin instructions. Please take TWO tablets TWICE a day the day before, the day of, and the day after chemotherapy) 120 tablet 2   famotidine (PEPCID) 20 MG tablet Take 20 mg by mouth daily.  FLUoxetine (PROZAC) 20 MG capsule TAKE 1 CAPSULE EVERY DAY (Patient taking differently: Take 20 mg by mouth daily.) 90 capsule 3   fluticasone (FLONASE) 50 MCG/ACT nasal spray Place 1 spray into both nostrils daily. 11.1 mL 0   gabapentin (NEURONTIN) 100 MG capsule Take 1 capsule (100 mg total) by mouth 2 (two) times daily. 90 capsule 2   HYDROcodone-acetaminophen (NORCO) 7.5-325 MG tablet Take 1 tablet by mouth every 6 (six) hours as needed for moderate pain. 60 tablet 0   lidocaine-prilocaine (EMLA) cream  Apply 1 application topically as needed. 30 g 1   magnesium oxide (MAG-OX) 400 MG tablet Take 1 tablet (400 mg total) by mouth daily. 30 tablet 1   polyvinyl alcohol (LIQUIFILM TEARS) 1.4 % ophthalmic solution Place 1 drop into both eyes as needed for dry eyes.     potassium chloride SA (KLOR-CON) 20 MEQ tablet Take 1 tablet (20 mEq total) by mouth daily. 7 tablet 0   tiotropium (SPIRIVA HANDIHALER) 18 MCG inhalation capsule Place 1 capsule (18 mcg total) into inhaler and inhale daily. 30 capsule 2   No current facility-administered medications for this visit.    SURGICAL HISTORY:  Past Surgical History:  Procedure Laterality Date   ABDOMINAL HYSTERECTOMY     CHOLECYSTECTOMY     IR IMAGING GUIDED PORT INSERTION  02/24/2019   ROTATOR CUFF REPAIR  3/04   VIDEO BRONCHOSCOPY WITH ENDOBRONCHIAL ULTRASOUND Right 11/25/2018   Procedure: VIDEO BRONCHOSCOPY WITH ENDOBRONCHIAL ULTRASOUND;  Surgeon: Collene Gobble, MD;  Location: [facility] OR;  Service: Thoracic;  Laterality: Right;    REVIEW OF SYSTEMS:  A comprehensive review of systems was negative except for: Constitutional: positive for fatigue Respiratory: positive for dyspnea on exertion   PHYSICAL EXAMINATION: General appearance: alert, cooperative, fatigued, and no distress Head: Normocephalic, without obvious abnormality, atraumatic Neck: no adenopathy, no JVD, supple, symmetrical, trachea midline, and thyroid not enlarged, symmetric, no tenderness/mass/nodules Lymph nodes: Cervical, supraclavicular, and axillary nodes normal. Resp: clear to auscultation bilaterally Back: symmetric, no curvature. ROM normal. No CVA tenderness. Cardio: regular rate and rhythm, S1, S2 normal, no murmur, click, rub or gallop GI: soft, non-tender; bowel sounds normal; no masses,  no organomegaly Extremities: extremities normal, atraumatic, no cyanosis or edema  ECOG PERFORMANCE STATUS: 1 - Symptomatic but completely ambulatory  Blood pressure 139/79, pulse 91,  temperature (!) 97.1 F (36.2 C), temperature source Tympanic, resp. rate 20, height 5' 1"  (1.549 m), weight 138 lb 6.4 oz (62.8 kg), SpO2 99 %.  LABORATORY DATA: Lab Results  Component Value Date   WBC 9.8 10/04/2020   HGB 10.7 (L) 10/04/2020   HCT 32.7 (L) 10/04/2020   MCV 100.9 (H) 10/04/2020   PLT 222 10/04/2020      Chemistry      Component Value Date/Time   NA 140 09/27/2020 1041   NA 139 07/21/2017 0958   K 3.2 (L) 09/27/2020 1041   CL 103 09/27/2020 1041   CO2 30 09/27/2020 1041   BUN 24 (H) 09/27/2020 1041   BUN 26 07/21/2017 0958   CREATININE 0.77 09/27/2020 1041   CREATININE 0.79 09/03/2013 1540      Component Value Date/Time   CALCIUM 9.8 09/27/2020 1041   ALKPHOS 88 09/27/2020 1041   AST 14 (L) 09/27/2020 1041   ALT <6 09/27/2020 1041   BILITOT 0.7 09/27/2020 1041       RADIOGRAPHIC STUDIES: No results found.  ASSESSMENT AND PLAN: This is a very pleasant 75 years old African-American female  with stage IV non-small cell carcinoma,, squamous cell carcinoma diagnosed in September 2020.  She presented with extensive right-sided pleural and thoracic nodal hypermetabolic disease with no extrathoracic disease. The patient started induction treatment with systemic chemotherapy with carboplatin, paclitaxel and Keytruda status post 4 cycles with partial response after cycle #4.  This was followed by 14 cycles of maintenance treatment with single agent Keytruda discontinued secondary to disease progression. The patient is currently undergoing second line systemic chemotherapy with docetaxel 75 mg/M2 and Cyramza 10 mg/KG every 3 weeks with Neulasta support.  Status post 13 cycles.  Starting from cycle #10 her dose of docetaxel was reduced to 65 Mg/M2. The patient continues to tolerate her treatment fairly well with no concerning adverse effect except for mild fatigue. I recommended for her to proceed with cycle #14 today as planned. She will have her CT scan of the chest,  abdomen pelvis performed next week and I will discuss results with her during the next visit. For the history of deep venous thrombosis, she will continue her current treatment with Xarelto. The patient was advised to call immediately if she has any other concerning symptoms in the interval. The patient voices understanding of current disease status and treatment options and is in agreement with the current care plan. All questions were answered. The patient knows to call the clinic with any problems, questions or concerns. We can certainly see the patient much sooner if necessary.  Disclaimer: This note was dictated with voice recognition software. Similar sounding words can inadvertently be transcribed and may not be corrected upon review.

## 2020-10-04 NOTE — Patient Instructions (Signed)
[facility] Discharge Instructions for Patients Receiving Chemotherapy  Today you received the following chemotherapy agents ramacirumab, docetaxel  To help prevent nausea and vomiting after your treatment, we encourage you to take your nausea medication as directed.   If you develop nausea and vomiting that is not controlled by your nausea medication, call the clinic.   BELOW ARE SYMPTOMS THAT SHOULD BE REPORTED IMMEDIATELY: *FEVER GREATER THAN 100.5 F *CHILLS WITH OR WITHOUT FEVER NAUSEA AND VOMITING THAT IS NOT CONTROLLED WITH YOUR NAUSEA MEDICATION *UNUSUAL SHORTNESS OF BREATH *UNUSUAL BRUISING OR BLEEDING TENDERNESS IN MOUTH AND THROAT WITH OR WITHOUT PRESENCE OF ULCERS *URINARY PROBLEMS *BOWEL PROBLEMS UNUSUAL RASH Items with * indicate a potential emergency and should be followed up as soon as possible.  Feel free to call the clinic should you have any questions or concerns. The clinic phone number is [phone].  Please show the [facility] at check-in to the Emergency Department and triage nurse.

## 2020-10-06 ENCOUNTER — Other Ambulatory Visit: Payer: Self-pay

## 2020-10-06 ENCOUNTER — Inpatient Hospital Stay: Payer: Medicare HMO

## 2020-10-06 VITALS — BP 130/87 | HR 79 | Temp 98.8°F | Resp 16

## 2020-10-06 DIAGNOSIS — C3491 Malignant neoplasm of unspecified part of right bronchus or lung: Secondary | ICD-10-CM

## 2020-10-06 DIAGNOSIS — Z5111 Encounter for antineoplastic chemotherapy: Secondary | ICD-10-CM | POA: Diagnosis not present

## 2020-10-06 MED ORDER — PEGFILGRASTIM-JMDB 6 MG/0.6ML ~~LOC~~ SOSY
PREFILLED_SYRINGE | SUBCUTANEOUS | Status: AC
  Filled 2020-10-06: qty 0.6

## 2020-10-06 MED ORDER — PEGFILGRASTIM-JMDB 6 MG/0.6ML ~~LOC~~ SOSY
6.0000 mg | PREFILLED_SYRINGE | Freq: Once | SUBCUTANEOUS | Status: AC
  Administered 2020-10-06: 6 mg via SUBCUTANEOUS

## 2020-10-06 NOTE — Patient Instructions (Signed)
Pegfilgrastim injection What is this medication? PEGFILGRASTIM (PEG fil gra stim) is a long-acting granulocyte colony-stimulating factor that stimulates the growth of neutrophils, a type of white blood cell important in the body's fight against infection. It is used to reduce the incidence of fever and infection in patients with certain types of cancer who are receiving chemotherapy that affects the bone marrow, and toincrease survival after being exposed to high doses of radiation. This medicine may be used for other purposes; ask your health care provider orpharmacist if you have questions. COMMON BRAND NAME(S): Rexene Edison, Ziextenzo What should I tell my care team before I take this medication? They need to know if you have any of these conditions: kidney disease latex allergy ongoing radiation therapy sickle cell disease skin reactions to acrylic adhesives (On-Body Injector only) an unusual or allergic reaction to pegfilgrastim, filgrastim, other medicines, foods, dyes, or preservatives pregnant or trying to get pregnant breast-feeding How should I use this medication? This medicine is for injection under the skin. If you get this medicine at home, you will be taught how to prepare and give the pre-filled syringe or how to use the On-body Injector. Refer to the patient Instructions for Use for detailed instructions. Use exactly as directed. Tell your healthcare provider immediately if you suspect that the On-body Injector may not have performed as intended or if you suspect the use of the On-body Injector resulted in a missedor partial dose. It is important that you put your used needles and syringes in a special sharps container. Do not put them in a trash can. If you do not have a sharpscontainer, call your pharmacist or healthcare provider to get one. Talk to your pediatrician regarding the use of this medicine in children. Whilethis drug may be prescribed for  selected conditions, precautions do apply. Overdosage: If you think you have taken too much of this medicine contact apoison control center or emergency room at once. NOTE: This medicine is only for you. Do not share this medicine with others. What if I miss a dose? It is important not to miss your dose. Call your doctor or health care professional if you miss your dose. If you miss a dose due to an On-body Injector failure or leakage, a new dose should be administered as soon aspossible using a single prefilled syringe for manual use. What may interact with this medication? Interactions have not been studied. This list may not describe all possible interactions. Give your health care provider a list of all the medicines, herbs, non-prescription drugs, or dietary supplements you use. Also tell them if you smoke, drink alcohol, or use illegaldrugs. Some items may interact with your medicine. What should I watch for while using this medication? Your condition will be monitored carefully while you are receiving thismedicine. You may need blood work done while you are taking this medicine. Talk to your health care provider about your risk of cancer. You may be more atrisk for certain types of cancer if you take this medicine. If you are going to need a MRI, CT scan, or other procedure, tell your doctorthat you are using this medicine (On-Body Injector only). What side effects may I notice from receiving this medication? Side effects that you should report to your doctor or health care professionalas soon as possible: allergic reactions (skin rash, itching or hives, swelling of the face, lips, or tongue) back pain dizziness fever pain, redness, or irritation at site where injected pinpoint red spots on the  skin red or dark-brown urine shortness of breath or breathing problems stomach or side pain, or pain at the shoulder swelling tiredness trouble passing urine or change in the amount of  urine unusual bruising or bleeding Side effects that usually do not require medical attention (report to yourdoctor or health care professional if they continue or are bothersome): bone pain muscle pain This list may not describe all possible side effects. Call your doctor for medical advice about side effects. You may report side effects to FDA at1-800-FDA-1088. Where should I keep my medication? Keep out of the reach of children. If you are using this medicine at home, you will be instructed on how to storeit. Throw away any unused medicine after the expiration date on the label. NOTE: This sheet is a summary. It may not cover all possible information. If you have questions about this medicine, talk to your doctor, pharmacist, orhealth care provider.  2022 Elsevier/Gold Standard (2020-04-07 11:54:14)

## 2020-10-10 ENCOUNTER — Encounter (HOSPITAL_COMMUNITY): Payer: Self-pay

## 2020-10-10 ENCOUNTER — Ambulatory Visit (HOSPITAL_COMMUNITY)
Admission: RE | Admit: 2020-10-10 | Discharge: 2020-10-10 | Disposition: A | Discharge status: Home / Self Care | Payer: Medicare HMO | Source: Ambulatory Visit | Attending: Internal Medicine | Admitting: Internal Medicine

## 2020-10-10 ENCOUNTER — Other Ambulatory Visit: Payer: Self-pay

## 2020-10-10 DIAGNOSIS — C3490 Malignant neoplasm of unspecified part of unspecified bronchus or lung: Secondary | ICD-10-CM

## 2020-10-10 MED ORDER — HEPARIN SOD (PORK) LOCK FLUSH 100 UNIT/ML IV SOLN: 500.0000 [IU] | Freq: Once | INTRAVENOUS | Status: AC

## 2020-10-10 MED ORDER — IOHEXOL 350 MG/ML SOLN
100.0000 mL | Freq: Once | INTRAVENOUS | Status: AC | PRN
  Administered 2020-10-10: 80 mL via INTRAVENOUS

## 2020-10-10 MED ORDER — IOHEXOL 9 MG/ML PO SOLN
ORAL | Status: AC
  Administered 2020-10-10: 1000 mL
  Filled 2020-10-10: qty 1000

## 2020-10-10 MED ORDER — IOHEXOL 9 MG/ML PO SOLN: 500.0000 mL | ORAL | Status: AC

## 2020-10-10 MED ORDER — HEPARIN SOD (PORK) LOCK FLUSH 100 UNIT/ML IV SOLN
INTRAVENOUS | Status: AC
  Administered 2020-10-10: 500 [IU] via INTRAVENOUS
  Filled 2020-10-10: qty 5

## 2020-10-11 ENCOUNTER — Inpatient Hospital Stay: Payer: Medicare HMO

## 2020-10-11 ENCOUNTER — Other Ambulatory Visit: Payer: Self-pay | Admitting: Physician Assistant

## 2020-10-11 DIAGNOSIS — Z95828 Presence of other vascular implants and grafts: Secondary | ICD-10-CM

## 2020-10-11 DIAGNOSIS — E876 Hypokalemia: Secondary | ICD-10-CM

## 2020-10-11 DIAGNOSIS — Z5111 Encounter for antineoplastic chemotherapy: Secondary | ICD-10-CM | POA: Diagnosis not present

## 2020-10-11 DIAGNOSIS — C3491 Malignant neoplasm of unspecified part of right bronchus or lung: Secondary | ICD-10-CM

## 2020-10-11 LAB — CBC WITH DIFFERENTIAL (CANCER CENTER ONLY)
Abs Immature Granulocytes: 0.08 10*3/uL — ABNORMAL HIGH (ref 0.00–0.07)
Basophils Absolute: 0 10*3/uL (ref 0.0–0.1)
Basophils Relative: 1 %
Eosinophils Absolute: 0 10*3/uL (ref 0.0–0.5)
Eosinophils Relative: 1 %
HCT: 30.1 % — ABNORMAL LOW (ref 36.0–46.0)
Hemoglobin: 10.1 g/dL — ABNORMAL LOW (ref 12.0–15.0)
Immature Granulocytes: 2 %
Lymphocytes Relative: 38 %
Lymphs Abs: 1.7 10*3/uL (ref 0.7–4.0)
MCH: 33.3 pg (ref 26.0–34.0)
MCHC: 33.6 g/dL (ref 30.0–36.0)
MCV: 99.3 fL (ref 80.0–100.0)
Monocytes Absolute: 0.5 10*3/uL (ref 0.1–1.0)
Monocytes Relative: 12 %
Neutro Abs: 2.1 10*3/uL (ref 1.7–7.7)
Neutrophils Relative %: 46 %
Platelet Count: 100 10*3/uL — ABNORMAL LOW (ref 150–400)
RBC: 3.03 MIL/uL — ABNORMAL LOW (ref 3.87–5.11)
RDW: 17 % — ABNORMAL HIGH (ref 11.5–15.5)
WBC Count: 4.4 10*3/uL (ref 4.0–10.5)
nRBC: 0 % (ref 0.0–0.2)

## 2020-10-11 LAB — CMP (CANCER CENTER ONLY)
ALT: 11 U/L (ref 0–44)
AST: 16 U/L (ref 15–41)
Albumin: 3.5 g/dL (ref 3.5–5.0)
Alkaline Phosphatase: 62 U/L (ref 38–126)
Anion gap: 11 (ref 5–15)
BUN: 16 mg/dL (ref 8–23)
CO2: 34 mmol/L — ABNORMAL HIGH (ref 22–32)
Calcium: 9.2 mg/dL (ref 8.9–10.3)
Chloride: 95 mmol/L — ABNORMAL LOW (ref 98–111)
Creatinine: 0.51 mg/dL (ref 0.44–1.00)
GFR, Estimated: >60 mL/min (ref >60)
Glucose, Bld: 84 mg/dL (ref 70–99)
Potassium: 2.9 mmol/L — ABNORMAL LOW (ref 3.5–5.1)
Sodium: 140 mmol/L (ref 135–145)
Total Bilirubin: 1.5 mg/dL — ABNORMAL HIGH (ref 0.3–1.2)
Total Protein: 6.3 g/dL — ABNORMAL LOW (ref 6.5–8.1)

## 2020-10-11 MED ORDER — HEPARIN SOD (PORK) LOCK FLUSH 100 UNIT/ML IV SOLN
500.0000 [IU] | Freq: Once | INTRAVENOUS | Status: AC | PRN
  Administered 2020-10-11: 500 [IU]
  Filled 2020-10-11: qty 5

## 2020-10-11 MED ORDER — ONDANSETRON HCL 4 MG/2ML IJ SOLN
INTRAMUSCULAR | Status: AC
  Filled 2020-10-11: qty 2

## 2020-10-11 MED ORDER — SODIUM CHLORIDE 0.9% FLUSH
10.0000 mL | INTRAVENOUS | Status: DC | PRN
  Administered 2020-10-11: 10 mL
  Filled 2020-10-11: qty 10

## 2020-10-11 MED ORDER — POTASSIUM CHLORIDE CRYS ER 20 MEQ PO TBCR: 20.0000 meq | EXTENDED_RELEASE_TABLET | Freq: Two times a day (BID) | ORAL | 0 refills | Status: DC

## 2020-10-11 NOTE — Progress Notes (Signed)
I called the patient to let her know I am refilling her potassium. I will check mg next week due to frequent and refractory hypokalemia.

## 2020-10-13 ENCOUNTER — Other Ambulatory Visit: Payer: Medicare HMO

## 2020-10-18 ENCOUNTER — Other Ambulatory Visit: Payer: Self-pay

## 2020-10-18 ENCOUNTER — Other Ambulatory Visit: Payer: Self-pay | Admitting: Physician Assistant

## 2020-10-18 ENCOUNTER — Inpatient Hospital Stay: Payer: Medicare HMO

## 2020-10-18 DIAGNOSIS — C3491 Malignant neoplasm of unspecified part of right bronchus or lung: Secondary | ICD-10-CM

## 2020-10-18 DIAGNOSIS — Z5111 Encounter for antineoplastic chemotherapy: Secondary | ICD-10-CM | POA: Diagnosis not present

## 2020-10-18 DIAGNOSIS — E876 Hypokalemia: Secondary | ICD-10-CM

## 2020-10-18 DIAGNOSIS — Z95828 Presence of other vascular implants and grafts: Secondary | ICD-10-CM

## 2020-10-18 LAB — CMP (CANCER CENTER ONLY)
ALT: <6 U/L (ref 0–44)
AST: 15 U/L (ref 15–41)
Albumin: 3.6 g/dL (ref 3.5–5.0)
Alkaline Phosphatase: 87 U/L (ref 38–126)
Anion gap: 8 (ref 5–15)
BUN: 19 mg/dL (ref 8–23)
CO2: 30 mmol/L (ref 22–32)
Calcium: 9.3 mg/dL (ref 8.9–10.3)
Chloride: 104 mmol/L (ref 98–111)
Creatinine: 0.81 mg/dL (ref 0.44–1.00)
GFR, Estimated: >60 mL/min
Glucose, Bld: 88 mg/dL (ref 70–99)
Potassium: 3.3 mmol/L — ABNORMAL LOW (ref 3.5–5.1)
Sodium: 142 mmol/L (ref 135–145)
Total Bilirubin: 0.7 mg/dL (ref 0.3–1.2)
Total Protein: 6.6 g/dL (ref 6.5–8.1)

## 2020-10-18 LAB — CBC WITH DIFFERENTIAL (CANCER CENTER ONLY)
Abs Immature Granulocytes: 0.16 10*3/uL — ABNORMAL HIGH (ref 0.00–0.07)
Basophils Absolute: 0 10*3/uL (ref 0.0–0.1)
Basophils Relative: 0 %
Eosinophils Absolute: 0 10*3/uL (ref 0.0–0.5)
Eosinophils Relative: 0 %
HCT: 32.8 % — ABNORMAL LOW (ref 36.0–46.0)
Hemoglobin: 10.6 g/dL — ABNORMAL LOW (ref 12.0–15.0)
Immature Granulocytes: 1 %
Lymphocytes Relative: 19 %
Lymphs Abs: 2.1 10*3/uL (ref 0.7–4.0)
MCH: 32.8 pg (ref 26.0–34.0)
MCHC: 32.3 g/dL (ref 30.0–36.0)
MCV: 101.5 fL — ABNORMAL HIGH (ref 80.0–100.0)
Monocytes Absolute: 0.4 10*3/uL (ref 0.1–1.0)
Monocytes Relative: 4 %
Neutro Abs: 8.4 10*3/uL — ABNORMAL HIGH (ref 1.7–7.7)
Neutrophils Relative %: 76 %
Platelet Count: 129 10*3/uL — ABNORMAL LOW (ref 150–400)
RBC: 3.23 MIL/uL — ABNORMAL LOW (ref 3.87–5.11)
RDW: 17.8 % — ABNORMAL HIGH (ref 11.5–15.5)
WBC Count: 11.1 10*3/uL — ABNORMAL HIGH (ref 4.0–10.5)
nRBC: 0.3 % — ABNORMAL HIGH (ref 0.0–0.2)

## 2020-10-18 LAB — TOTAL PROTEIN, URINE DIPSTICK: Protein, ur: NEGATIVE mg/dL — AB

## 2020-10-18 MED ORDER — POTASSIUM CHLORIDE CRYS ER 20 MEQ PO TBCR: 20.0000 meq | EXTENDED_RELEASE_TABLET | Freq: Every day | ORAL | 0 refills | Status: DC

## 2020-10-18 MED ORDER — SODIUM CHLORIDE 0.9% FLUSH
10.0000 mL | INTRAVENOUS | Status: DC | PRN
  Administered 2020-10-18: 10 mL
  Filled 2020-10-18: qty 10

## 2020-10-18 MED ORDER — HEPARIN SOD (PORK) LOCK FLUSH 100 UNIT/ML IV SOLN
500.0000 [IU] | Freq: Once | INTRAVENOUS | Status: AC | PRN
  Administered 2020-10-18: 500 [IU]
  Filled 2020-10-18: qty 5

## 2020-10-20 ENCOUNTER — Other Ambulatory Visit: Payer: Medicare HMO

## 2020-10-25 ENCOUNTER — Inpatient Hospital Stay (HOSPITAL_BASED_OUTPATIENT_CLINIC_OR_DEPARTMENT_OTHER): Payer: Medicare HMO | Admitting: Internal Medicine

## 2020-10-25 ENCOUNTER — Inpatient Hospital Stay: Payer: Medicare HMO

## 2020-10-25 ENCOUNTER — Other Ambulatory Visit: Payer: Self-pay

## 2020-10-25 ENCOUNTER — Inpatient Hospital Stay: Payer: Medicare HMO | Attending: Internal Medicine

## 2020-10-25 VITALS — BP 135/89 | HR 91 | Temp 97.5°F | Resp 20 | Ht 61.0 in | Wt 140.1 lb

## 2020-10-25 DIAGNOSIS — Z79899 Other long term (current) drug therapy: Secondary | ICD-10-CM | POA: Insufficient documentation

## 2020-10-25 DIAGNOSIS — G4730 Sleep apnea, unspecified: Secondary | ICD-10-CM | POA: Diagnosis not present

## 2020-10-25 DIAGNOSIS — C782 Secondary malignant neoplasm of pleura: Secondary | ICD-10-CM | POA: Insufficient documentation

## 2020-10-25 DIAGNOSIS — Z5189 Encounter for other specified aftercare: Secondary | ICD-10-CM | POA: Insufficient documentation

## 2020-10-25 DIAGNOSIS — I714 Abdominal aortic aneurysm, without rupture: Secondary | ICD-10-CM | POA: Diagnosis not present

## 2020-10-25 DIAGNOSIS — F17210 Nicotine dependence, cigarettes, uncomplicated: Secondary | ICD-10-CM | POA: Diagnosis not present

## 2020-10-25 DIAGNOSIS — J439 Emphysema, unspecified: Secondary | ICD-10-CM | POA: Diagnosis not present

## 2020-10-25 DIAGNOSIS — Z5111 Encounter for antineoplastic chemotherapy: Secondary | ICD-10-CM

## 2020-10-25 DIAGNOSIS — E876 Hypokalemia: Secondary | ICD-10-CM

## 2020-10-25 DIAGNOSIS — C771 Secondary and unspecified malignant neoplasm of intrathoracic lymph nodes: Secondary | ICD-10-CM | POA: Insufficient documentation

## 2020-10-25 DIAGNOSIS — Z5112 Encounter for antineoplastic immunotherapy: Secondary | ICD-10-CM | POA: Insufficient documentation

## 2020-10-25 DIAGNOSIS — R5383 Other fatigue: Secondary | ICD-10-CM | POA: Diagnosis not present

## 2020-10-25 DIAGNOSIS — C3491 Malignant neoplasm of unspecified part of right bronchus or lung: Secondary | ICD-10-CM

## 2020-10-25 DIAGNOSIS — G629 Polyneuropathy, unspecified: Secondary | ICD-10-CM | POA: Diagnosis not present

## 2020-10-25 DIAGNOSIS — I2510 Atherosclerotic heart disease of native coronary artery without angina pectoris: Secondary | ICD-10-CM | POA: Insufficient documentation

## 2020-10-25 DIAGNOSIS — I700 Atherosclerosis of aorta: Secondary | ICD-10-CM | POA: Diagnosis not present

## 2020-10-25 DIAGNOSIS — C3411 Malignant neoplasm of upper lobe, right bronchus or lung: Secondary | ICD-10-CM | POA: Insufficient documentation

## 2020-10-25 DIAGNOSIS — I10 Essential (primary) hypertension: Secondary | ICD-10-CM | POA: Diagnosis not present

## 2020-10-25 DIAGNOSIS — Z86718 Personal history of other venous thrombosis and embolism: Secondary | ICD-10-CM | POA: Insufficient documentation

## 2020-10-25 DIAGNOSIS — Z95828 Presence of other vascular implants and grafts: Secondary | ICD-10-CM

## 2020-10-25 LAB — MAGNESIUM: Magnesium: 1.9 mg/dL (ref 1.7–2.4)

## 2020-10-25 LAB — CMP (CANCER CENTER ONLY)
ALT: 8 U/L (ref 0–44)
AST: 14 U/L — ABNORMAL LOW (ref 15–41)
Albumin: 3.8 g/dL (ref 3.5–5.0)
Alkaline Phosphatase: 62 U/L (ref 38–126)
Anion gap: 12 (ref 5–15)
BUN: 35 mg/dL — ABNORMAL HIGH (ref 8–23)
CO2: 27 mmol/L (ref 22–32)
Calcium: 9.7 mg/dL (ref 8.9–10.3)
Chloride: 100 mmol/L (ref 98–111)
Creatinine: 0.89 mg/dL (ref 0.44–1.00)
GFR, Estimated: >60 mL/min (ref >60)
Glucose, Bld: 124 mg/dL — ABNORMAL HIGH (ref 70–99)
Potassium: 3.6 mmol/L (ref 3.5–5.1)
Sodium: 139 mmol/L (ref 135–145)
Total Bilirubin: 0.7 mg/dL (ref 0.3–1.2)
Total Protein: 6.9 g/dL (ref 6.5–8.1)

## 2020-10-25 LAB — CBC WITH DIFFERENTIAL (CANCER CENTER ONLY)
Abs Immature Granulocytes: 0.09 10*3/uL — ABNORMAL HIGH (ref 0.00–0.07)
Basophils Absolute: 0 10*3/uL (ref 0.0–0.1)
Basophils Relative: 0 %
Eosinophils Absolute: 0 10*3/uL (ref 0.0–0.5)
Eosinophils Relative: 0 %
HCT: 31.1 % — ABNORMAL LOW (ref 36.0–46.0)
Hemoglobin: 10.3 g/dL — ABNORMAL LOW (ref 12.0–15.0)
Immature Granulocytes: 1 %
Lymphocytes Relative: 9 %
Lymphs Abs: 0.9 10*3/uL (ref 0.7–4.0)
MCH: 32.9 pg (ref 26.0–34.0)
MCHC: 33.1 g/dL (ref 30.0–36.0)
MCV: 99.4 fL (ref 80.0–100.0)
Monocytes Absolute: 0.1 10*3/uL (ref 0.1–1.0)
Monocytes Relative: 1 %
Neutro Abs: 8.5 10*3/uL — ABNORMAL HIGH (ref 1.7–7.7)
Neutrophils Relative %: 89 %
Platelet Count: 215 10*3/uL (ref 150–400)
RBC: 3.13 MIL/uL — ABNORMAL LOW (ref 3.87–5.11)
RDW: 17.7 % — ABNORMAL HIGH (ref 11.5–15.5)
WBC Count: 9.6 10*3/uL (ref 4.0–10.5)
nRBC: 0 % (ref 0.0–0.2)

## 2020-10-25 MED ORDER — SODIUM CHLORIDE 0.9 % IV SOLN
10.0000 mg/kg | Freq: Once | INTRAVENOUS | Status: AC
  Administered 2020-10-25: 600 mg via INTRAVENOUS
  Filled 2020-10-25: qty 50

## 2020-10-25 MED ORDER — HEPARIN SOD (PORK) LOCK FLUSH 100 UNIT/ML IV SOLN
500.0000 [IU] | Freq: Once | INTRAVENOUS | Status: AC | PRN
  Administered 2020-10-25: 500 [IU]
  Filled 2020-10-25: qty 5

## 2020-10-25 MED ORDER — DIPHENHYDRAMINE HCL 50 MG/ML IJ SOLN
INTRAMUSCULAR | Status: AC
  Filled 2020-10-25: qty 1

## 2020-10-25 MED ORDER — SODIUM CHLORIDE 0.9 % IV SOLN
65.0000 mg/m2 | Freq: Once | INTRAVENOUS | Status: AC
  Administered 2020-10-25: 110 mg via INTRAVENOUS
  Filled 2020-10-25: qty 11

## 2020-10-25 MED ORDER — ACETAMINOPHEN 325 MG PO TABS
ORAL_TABLET | ORAL | Status: AC
  Filled 2020-10-25: qty 2

## 2020-10-25 MED ORDER — DIPHENHYDRAMINE HCL 50 MG/ML IJ SOLN
50.0000 mg | Freq: Once | INTRAMUSCULAR | Status: AC
  Administered 2020-10-25: 50 mg via INTRAVENOUS

## 2020-10-25 MED ORDER — SODIUM CHLORIDE 0.9 % IV SOLN
10.0000 mg | Freq: Once | INTRAVENOUS | Status: AC
  Administered 2020-10-25: 10 mg via INTRAVENOUS
  Filled 2020-10-25: qty 10

## 2020-10-25 MED ORDER — ACETAMINOPHEN 325 MG PO TABS
650.0000 mg | ORAL_TABLET | Freq: Once | ORAL | Status: AC
  Administered 2020-10-25: 650 mg via ORAL

## 2020-10-25 MED ORDER — SODIUM CHLORIDE 0.9% FLUSH
10.0000 mL | INTRAVENOUS | Status: DC | PRN
  Administered 2020-10-25: 10 mL
  Filled 2020-10-25: qty 10

## 2020-10-25 MED ORDER — SODIUM CHLORIDE 0.9 % IV SOLN
Freq: Once | INTRAVENOUS | Status: AC
  Filled 2020-10-25: qty 250

## 2020-10-25 NOTE — Patient Instructions (Signed)
[facility]  Discharge Instructions: Thank you for choosing [facility] to provide your oncology and hematology care.   If you have a lab appointment with the [facility], please go directly to the [facility] and check in at the registration area.   Wear comfortable clothing and clothing appropriate for easy access to any Portacath or PICC line.   We strive to give you quality time with your provider. You may need to reschedule your appointment if you arrive late (15 or more minutes).  Arriving late affects you and other patients whose appointments are after yours.  Also, if you miss three or more appointments without notifying the office, you may be dismissed from the clinic at the provider's discretion.      For prescription refill requests, have your pharmacy contact our office and allow 72 hours for refills to be completed.    Today you received the following chemotherapy and/or immunotherapy agents: Cyramza & Taxotere.     To help prevent nausea and vomiting after your treatment, we encourage you to take your nausea medication as directed.  BELOW ARE SYMPTOMS THAT SHOULD BE REPORTED IMMEDIATELY: *FEVER GREATER THAN 100.4 F (38 C) OR HIGHER *CHILLS OR SWEATING *NAUSEA AND VOMITING THAT IS NOT CONTROLLED WITH YOUR NAUSEA MEDICATION *UNUSUAL SHORTNESS OF BREATH *UNUSUAL BRUISING OR BLEEDING *URINARY PROBLEMS (pain or burning when urinating, or frequent urination) *BOWEL PROBLEMS (unusual diarrhea, constipation, pain near the anus) TENDERNESS IN MOUTH AND THROAT WITH OR WITHOUT PRESENCE OF ULCERS (sore throat, sores in mouth, or a toothache) UNUSUAL RASH, SWELLING OR PAIN  UNUSUAL VAGINAL DISCHARGE OR ITCHING   Items with * indicate a potential emergency and should be followed up as soon as possible or go to the Emergency Department if any problems should occur.  Please show the CHEMOTHERAPY ALERT CARD or IMMUNOTHERAPY ALERT CARD at  check-in to the Emergency Department and triage nurse.  Should you have questions after your visit or need to cancel or reschedule your appointment, please contact [facility]  Dept: [phone]  and follow the prompts.  Office hours are 8:00 a.m. to 4:30 p.m. Monday - Friday. Please note that voicemails left after 4:00 p.m. may not be returned until the following business day.  We are closed weekends and major holidays. You have access to a nurse at all times for urgent questions. Please call the main number to the clinic Dept: [phone] and follow the prompts.   For any non-urgent questions, you may also contact your provider using MyChart. We now offer e-Visits for anyone 13 and older to request care online for non-urgent symptoms. For details visit [email or web address].   Also download the MyChart app! Go to the app store, search "MyChart", open the app, select [facility], and log in with your MyChart username and password.  Due to Covid, a mask is required upon entering the hospital/clinic. If you do not have a mask, one will be given to you upon arrival. For doctor visits, patients may have 1 support person aged 35 or older with them. For treatment visits, patients cannot have anyone with them due to current Covid guidelines and our immunocompromised population.

## 2020-10-25 NOTE — Progress Notes (Signed)
[facility] [phone]   [phone]  OFFICE PROGRESS NOTE  [name], MD [place] [address]  DIAGNOSIS: Stage IV non-small cell lung cancer, squamous cell carcinoma. She presented with right upper lobe lung mass in addition to pleural-based metastasis and mediastinal lymphadenopathy. She was diagnosed in September 2020.    PRIOR THERAPY: Chemotherapy with carboplatin for an AUC of 5, paclitaxel 175 mg/m, and Keytruda 200 mg IV every 3 weeks with Neulasta support. Last dose 12/08/19. Status post 18 cycles.  Starting from cycle #5 was on maintenance treatment with single agent Keytruda every 3 weeks. This was discontinued due to evidence of disease progression.    CURRENT THERAPY: Palliative systemic chemotherapy with docetaxel 75 mg/m2 and Cyramza 10 mg/kg IV every 3 weeks with Neulasta support. First dose on 01/04/20.  Status post 14 cycles.  Starting from cycle #10 docetaxel was reduced to 65 Mg/M2  INTERVAL HISTORY: Debra Rodgers 75 y.o. female returns to the clinic today for follow-up visit.  The patient is feeling fine today with no concerning complaints except for mild fatigue and arthralgia.  She denied having any current chest pain, shortness of breath, cough or hemoptysis.  She denied having any fever or chills.  She has no nausea, vomiting, diarrhea or constipation.  She has no headache or visual changes.  She continues to tolerate her treatment with docetaxel and Cyramza fairly well.  The patient had repeat CT scan of the chest, abdomen pelvis performed recently and she is here for evaluation and discussion of her risk her results.  MEDICAL HISTORY: Past Medical History:  Diagnosis Date   AAA (abdominal aortic aneurysm) (HCC)    COPD (chronic obstructive pulmonary disease) ([facility])    Depression    Essential hypertension    Headache    History of migraine headaches    lung ca dx'd 10/2018   Sleep apnea     Tobacco use disorder     ALLERGIES:  is allergic to codeine sulfate and pantoprazole sodium.  MEDICATIONS:  Current Outpatient Medications  Medication Sig Dispense Refill   acetaminophen (TYLENOL) 500 MG tablet Take 500 mg by mouth every 6 (six) hours as needed for moderate pain.     albuterol (VENTOLIN HFA) 108 (90 Base) MCG/ACT inhaler INHALE 2 PUFFS INTO THE LUNGS EVERY 6 (SIX) HOURS AS NEEDED FOR WHEEZING OR SHORTNESS OF BREATH. 4 each 0   amLODipine (NORVASC) 10 MG tablet TAKE 1 TABLET EVERY DAY 90 tablet 3   atorvastatin (LIPITOR) 20 MG tablet TAKE 1 TABLET EVERY DAY 90 tablet 3   benzonatate (TESSALON) 100 MG capsule TAKE 1 CAPSULE EVERY 8 HOURS AS NEEDED FOR COUGH (Patient taking differently: Take 100 mg by mouth 3 (three) times daily as needed for cough.) 90 capsule 0   chlorthalidone (HYGROTON) 50 MG tablet TAKE 1 TABLET EVERY DAY (Patient taking differently: Take 50 mg by mouth daily.) 90 tablet 3   dexamethasone (DECADRON) 4 MG tablet Please take TWO tablets TWICE a day the day before, the day of, and the day after chemotherapy (Patient taking differently: Take 8 mg by mouth See admin instructions. Please take TWO tablets TWICE a day the day before, the day of, and the day after chemotherapy) 120 tablet 2   famotidine (PEPCID) 20 MG tablet Take 20 mg by mouth daily.      FLUoxetine (PROZAC) 20 MG capsule TAKE 1 CAPSULE EVERY DAY (Patient taking differently: Take  20 mg by mouth daily.) 90 capsule 3   fluticasone (FLONASE) 50 MCG/ACT nasal spray Place 1 spray into both nostrils daily. 11.1 mL 0   gabapentin (NEURONTIN) 100 MG capsule Take 1 capsule (100 mg total) by mouth 2 (two) times daily. 90 capsule 2   HYDROcodone-acetaminophen (NORCO) 7.5-325 MG tablet Take 1 tablet by mouth every 6 (six) hours as needed for moderate pain. 60 tablet 0   lidocaine-prilocaine (EMLA) cream Apply 1 application topically as needed. 30 g 1   magnesium oxide (MAG-OX) 400 MG tablet Take 1 tablet (400  mg total) by mouth daily. 30 tablet 1   polyvinyl alcohol (LIQUIFILM TEARS) 1.4 % ophthalmic solution Place 1 drop into both eyes as needed for dry eyes.     potassium chloride SA (KLOR-CON) 20 MEQ tablet Take 1 tablet (20 mEq total) by mouth daily. 7 tablet 0   tiotropium (SPIRIVA HANDIHALER) 18 MCG inhalation capsule Place 1 capsule (18 mcg total) into inhaler and inhale daily. 30 capsule 2   No current facility-administered medications for this visit.   Facility-Administered Medications Ordered in Other Visits  Medication Dose Route Frequency Provider Last Rate Last Admin   sodium chloride flush (NS) 0.9 % injection 10 mL  10 mL Intracatheter PRN Curt Bears, MD   10 mL at 10/25/20 1128    SURGICAL HISTORY:  Past Surgical History:  Procedure Laterality Date   ABDOMINAL HYSTERECTOMY     CHOLECYSTECTOMY     IR IMAGING GUIDED PORT INSERTION  02/24/2019   ROTATOR CUFF REPAIR  3/04   VIDEO BRONCHOSCOPY WITH ENDOBRONCHIAL ULTRASOUND Right 11/25/2018   Procedure: VIDEO BRONCHOSCOPY WITH ENDOBRONCHIAL ULTRASOUND;  Surgeon: Collene Gobble, MD;  Location: [facility];  Service: Thoracic;  Laterality: Right;    REVIEW OF SYSTEMS:  Constitutional: positive for fatigue Eyes: negative Ears, nose, mouth, throat, and face: negative Respiratory: negative Cardiovascular: negative Gastrointestinal: negative Genitourinary:negative Integument/breast: negative Hematologic/lymphatic: negative Musculoskeletal:positive for arthralgias Neurological: negative Behavioral/Psych: negative Endocrine: negative Allergic/Immunologic: negative   PHYSICAL EXAMINATION: General appearance: alert, cooperative, fatigued, and no distress Head: Normocephalic, without obvious abnormality, atraumatic Neck: no adenopathy, no JVD, supple, symmetrical, trachea midline, and thyroid not enlarged, symmetric, no tenderness/mass/nodules Lymph nodes: Cervical, supraclavicular, and axillary nodes normal. Resp: clear to  auscultation bilaterally Back: symmetric, no curvature. ROM normal. No CVA tenderness. Cardio: regular rate and rhythm, S1, S2 normal, no murmur, click, rub or gallop GI: soft, non-tender; bowel sounds normal; no masses,  no organomegaly Extremities: extremities normal, atraumatic, no cyanosis or edema Neurologic: Alert and oriented X 3, normal strength and tone. Normal symmetric reflexes. Normal coordination and gait  ECOG PERFORMANCE STATUS: 1 - Symptomatic but completely ambulatory  Blood pressure 135/89, pulse 91, temperature (!) 97.5 F (36.4 C), temperature source Tympanic, resp. rate 20, height 5' 1"  (1.549 m), weight 140 lb 1.6 oz (63.5 kg), SpO2 99 %.  LABORATORY DATA: Lab Results  Component Value Date   WBC 11.1 (H) 10/18/2020   HGB 10.6 (L) 10/18/2020   HCT 32.8 (L) 10/18/2020   MCV 101.5 (H) 10/18/2020   PLT 129 (L) 10/18/2020      Chemistry      Component Value Date/Time   NA 142 10/18/2020 1023   NA 139 07/21/2017 0958   K 3.3 (L) 10/18/2020 1023   CL 104 10/18/2020 1023   CO2 30 10/18/2020 1023   BUN 19 10/18/2020 1023   BUN 26 07/21/2017 0958   CREATININE 0.81 10/18/2020 1023   CREATININE 0.79 09/03/2013 1540  Component Value Date/Time   CALCIUM 9.3 10/18/2020 1023   ALKPHOS 87 10/18/2020 1023   AST 15 10/18/2020 1023   ALT <6 10/18/2020 1023   BILITOT 0.7 10/18/2020 1023       RADIOGRAPHIC STUDIES: CT Chest W Contrast  Result Date: 10/11/2020 CLINICAL DATA:  Non-small cell lung cancer, staging. History of lung cancer diagnosed in 2020. Chemotherapy in progress. Patient reports shortness of breath on exertion and diarrhea. EXAM: CT CHEST, ABDOMEN, AND PELVIS WITH CONTRAST TECHNIQUE: Multidetector CT imaging of the chest, abdomen and pelvis was performed following the standard protocol during bolus administration of intravenous contrast. CONTRAST:  29m OMNIPAQUE IOHEXOL 350 MG/ML SOLN COMPARISON:  Prior CTs 07/07/2020 and 05/08/2020. FINDINGS: CT  CHEST FINDINGS Cardiovascular: Extensive atherosclerosis of the aorta, great vessels and coronary arteries again noted. There is stable dilatation of the ascending aorta to 4.2 cm and mild central enlargement of the pulmonary arteries. No acute vascular findings are seen. Right IJ Port-A-Cath extends to the level of the superior right atrium, unchanged. The heart size is normal. There is no pericardial effusion. Mediastinum/Nodes: There are no enlarged mediastinal, hilar or axillary lymph nodes. The thyroid gland, trachea and esophagus demonstrate no significant findings. Lungs/Pleura: Trace simple appearing dependent left pleural effusion. No right pleural effusion or pneumothorax. Moderate centrilobular and paraseptal emphysema with chronic elevation of the left hemidiaphragm. There is associated bibasilar atelectasis or scarring which appears unchanged. There is chronic central airway thickening and a stable 4 mm right upper lobe nodule on image 46/6. No suspicious pulmonary nodules. Musculoskeletal/Chest wall: No chest wall mass or suspicious osseous findings. Grossly stable degenerative changes in the spine, greatest near the cervicothoracic junction. Previous lower cervical fusion. CT ABDOMEN AND PELVIS FINDINGS Hepatobiliary: The liver is normal in density without suspicious focal abnormality. Stable low-density hepatic cysts. No evidence of biliary dilatation status post cholecystectomy. Pancreas: Unremarkable. No pancreatic ductal dilatation or surrounding inflammatory changes. Spleen: Normal in size without focal abnormality. Adrenals/Urinary Tract: Both adrenal glands appear normal. The kidneys appear normal without evidence of urinary tract calculus, suspicious lesion or hydronephrosis. No bladder abnormalities are seen. Stomach/Bowel: Enteric contrast was administered and has passed to the mid transverse colon. The stomach appears unremarkable for its degree of distention. Multiple small bowel loops  extend peripheral to the splenic flexure of the colon, suggesting a chronic internal hernia. No evidence of bowel wall thickening, distention or surrounding inflammation. The colon is decompressed and grossly stable. There are diverticular changes and mild chronic wall thickening of the sigmoid colon. Vascular/Lymphatic: There are no enlarged abdominal or pelvic lymph nodes. 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 Just above the diaphragmatic hiatus, the aorta measures up to 4.5 cm in diameter. Just above the renal arteries, the aorta measures up to 4.7 x 4.0 cm on image 56/2. This compares with 4.7 x 4.1 cm previously (remeasured). No evidence of retroperitoneal hemorrhage or large vessel occlusion. Reproductive: No significant change in large cystic appearing left adnexal mass measuring up to 7.1 x 7.4 cm on image 90/2. This has slowly grown compared with older prior studies dating back to 2018, although demonstrates no solid components and is probably a benign cystic neoplasm. Previous hysterectomy. No right adnexal mass. Other: Postsurgical changes in the low anterior abdominal wall. No abdominal wall hernia, ascites or peritoneal nodularity. Musculoskeletal: No acute or significant osseous findings. Multilevel lumbar spondylosis. IMPRESSION: 1. No evidence of local recurrence of lung cancer or metastatic disease  in the chest, abdomen or pelvis. 2. Suspected chronic internal hernia with displacement of small bowel loops peripheral to the splenic flexure of the colon. No evidence of bowel obstruction or incarceration. 3. 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 Suggest continued annual CT follow-up. 4. Slowly enlarging cystic lesion in the left adnexa, now measuring up to 7.4 cm, most consistent with a benign cystic neoplasm. If not previously performed, consider further characterization with pelvic  ultrasound in 6-12 months. Note: This recommendation does not apply to premenarchal patients and to those with increased risk (genetic, family history, elevated tumor markers or other high-risk factors) of ovarian cancer. Reference: JACR 2020 Feb; 17(2):248-254 5. Coronary and Aortic Atherosclerosis (ICD10-I70.0). Emphysema (ICD10-J43.9). Electronically Signed   By: Richardean Sale M.D.   On: 10/11/2020 10:00   CT Abdomen Pelvis W Contrast  Result Date: 10/11/2020 CLINICAL DATA:  Non-small cell lung cancer, staging. History of lung cancer diagnosed in 2020. Chemotherapy in progress. Patient reports shortness of breath on exertion and diarrhea. EXAM: CT CHEST, ABDOMEN, AND PELVIS WITH CONTRAST TECHNIQUE: Multidetector CT imaging of the chest, abdomen and pelvis was performed following the standard protocol during bolus administration of intravenous contrast. CONTRAST:  68m OMNIPAQUE IOHEXOL 350 MG/ML SOLN COMPARISON:  Prior CTs 07/07/2020 and 05/08/2020. FINDINGS: CT CHEST FINDINGS Cardiovascular: Extensive atherosclerosis of the aorta, great vessels and coronary arteries again noted. There is stable dilatation of the ascending aorta to 4.2 cm and mild central enlargement of the pulmonary arteries. No acute vascular findings are seen. Right IJ Port-A-Cath extends to the level of the superior right atrium, unchanged. The heart size is normal. There is no pericardial effusion. Mediastinum/Nodes: There are no enlarged mediastinal, hilar or axillary lymph nodes. The thyroid gland, trachea and esophagus demonstrate no significant findings. Lungs/Pleura: Trace simple appearing dependent left pleural effusion. No right pleural effusion or pneumothorax. Moderate centrilobular and paraseptal emphysema with chronic elevation of the left hemidiaphragm. There is associated bibasilar atelectasis or scarring which appears unchanged. There is chronic central airway thickening and a stable 4 mm right upper lobe nodule on image  46/6. No suspicious pulmonary nodules. Musculoskeletal/Chest wall: No chest wall mass or suspicious osseous findings. Grossly stable degenerative changes in the spine, greatest near the cervicothoracic junction. Previous lower cervical fusion. CT ABDOMEN AND PELVIS FINDINGS Hepatobiliary: The liver is normal in density without suspicious focal abnormality. Stable low-density hepatic cysts. No evidence of biliary dilatation status post cholecystectomy. Pancreas: Unremarkable. No pancreatic ductal dilatation or surrounding inflammatory changes. Spleen: Normal in size without focal abnormality. Adrenals/Urinary Tract: Both adrenal glands appear normal. The kidneys appear normal without evidence of urinary tract calculus, suspicious lesion or hydronephrosis. No bladder abnormalities are seen. Stomach/Bowel: Enteric contrast was administered and has passed to the mid transverse colon. The stomach appears unremarkable for its degree of distention. Multiple small bowel loops extend peripheral to the splenic flexure of the colon, suggesting a chronic internal hernia. No evidence of bowel wall thickening, distention or surrounding inflammation. The colon is decompressed and grossly stable. There are diverticular changes and mild chronic wall thickening of the sigmoid colon. Vascular/Lymphatic: There are no enlarged abdominal or pelvic lymph nodes. 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 Just above the diaphragmatic hiatus, the aorta measures up to 4.5 cm in diameter. Just above the renal arteries, the aorta measures up to 4.7 x 4.0 cm on image 56/2. This compares with 4.7 x 4.1 cm previously (remeasured). No evidence of  retroperitoneal hemorrhage or large vessel occlusion. Reproductive: No significant change in large cystic appearing left adnexal mass measuring up to 7.1 x 7.4 cm on image 90/2. This has slowly grown compared with older prior  studies dating back to 2018, although demonstrates no solid components and is probably a benign cystic neoplasm. Previous hysterectomy. No right adnexal mass. Other: Postsurgical changes in the low anterior abdominal wall. No abdominal wall hernia, ascites or peritoneal nodularity. Musculoskeletal: No acute or significant osseous findings. Multilevel lumbar spondylosis. IMPRESSION: 1. No evidence of local recurrence of lung cancer or metastatic disease in the chest, abdomen or pelvis. 2. Suspected chronic internal hernia with displacement of small bowel loops peripheral to the splenic flexure of the colon. No evidence of bowel obstruction or incarceration. 3. 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 Suggest continued annual CT follow-up. 4. Slowly enlarging cystic lesion in the left adnexa, now measuring up to 7.4 cm, most consistent with a benign cystic neoplasm. If not previously performed, consider further characterization with pelvic ultrasound in 6-12 months. Note: This recommendation does not apply to premenarchal patients and to those with increased risk (genetic, family history, elevated tumor markers or other high-risk factors) of ovarian cancer. Reference: JACR 2020 Feb; 17(2):248-254 5. Coronary and Aortic Atherosclerosis (ICD10-I70.0). Emphysema (ICD10-J43.9). Electronically Signed   By: Richardean Sale M.D.   On: 10/11/2020 10:00    ASSESSMENT AND PLAN: This is a very pleasant 75 years old African-American female with stage IV non-small cell carcinoma,, squamous cell carcinoma diagnosed in September 2020.  She presented with extensive right-sided pleural and thoracic nodal hypermetabolic disease with no extrathoracic disease. The patient started induction treatment with systemic chemotherapy with carboplatin, paclitaxel and Keytruda status post 4 cycles with partial response after cycle #4.  This was followed by 14 cycles of maintenance treatment with single agent Keytruda  discontinued secondary to disease progression. The patient is currently undergoing second line systemic chemotherapy with docetaxel 75 mg/M2 and Cyramza 10 mg/KG every 3 weeks with Neulasta support.  Status post 14 cycles.  Starting from cycle #10 her dose of docetaxel was reduced to 65 Mg/M2. The patient continues to tolerate this treatment well with no concerning adverse effect except for mild fatigue and arthralgia. She had repeat CT scan of the chest, abdomen pelvis performed recently.  I personally and independently reviewed the scans and discussed the result with the patient today. Her scan showed no concerning findings for disease progression. I recommended for her to proceed with cycle #15 today as planned. I will see her back for follow-up visit in 3 weeks for evaluation before the next cycle of her treatment. For the history of deep venous thrombosis, she was treated with Xarelto in the past. The patient was advised to call immediately if she has any other concerning symptoms in the interval.  The patient voices understanding of current disease status and treatment options and is in agreement with the current care plan. All questions were answered. The patient knows to call the clinic with any problems, questions or concerns. We can certainly see the patient much sooner if necessary.  Disclaimer: This note was dictated with voice recognition software. Similar sounding words can inadvertently be transcribed and may not be corrected upon review.

## 2020-10-27 ENCOUNTER — Other Ambulatory Visit: Payer: Self-pay

## 2020-10-27 ENCOUNTER — Inpatient Hospital Stay: Payer: Medicare HMO

## 2020-10-27 VITALS — BP 139/89 | HR 78 | Temp 98.7°F | Resp 18

## 2020-10-27 DIAGNOSIS — C771 Secondary and unspecified malignant neoplasm of intrathoracic lymph nodes: Secondary | ICD-10-CM | POA: Diagnosis not present

## 2020-10-27 DIAGNOSIS — I714 Abdominal aortic aneurysm, without rupture: Secondary | ICD-10-CM | POA: Diagnosis not present

## 2020-10-27 DIAGNOSIS — C3411 Malignant neoplasm of upper lobe, right bronchus or lung: Secondary | ICD-10-CM | POA: Diagnosis not present

## 2020-10-27 DIAGNOSIS — Z5189 Encounter for other specified aftercare: Secondary | ICD-10-CM | POA: Diagnosis not present

## 2020-10-27 DIAGNOSIS — Z5111 Encounter for antineoplastic chemotherapy: Secondary | ICD-10-CM | POA: Diagnosis not present

## 2020-10-27 DIAGNOSIS — I10 Essential (primary) hypertension: Secondary | ICD-10-CM | POA: Diagnosis not present

## 2020-10-27 DIAGNOSIS — C3491 Malignant neoplasm of unspecified part of right bronchus or lung: Secondary | ICD-10-CM

## 2020-10-27 DIAGNOSIS — G629 Polyneuropathy, unspecified: Secondary | ICD-10-CM | POA: Diagnosis not present

## 2020-10-27 DIAGNOSIS — Z5112 Encounter for antineoplastic immunotherapy: Secondary | ICD-10-CM | POA: Diagnosis not present

## 2020-10-27 DIAGNOSIS — C782 Secondary malignant neoplasm of pleura: Secondary | ICD-10-CM | POA: Diagnosis not present

## 2020-10-27 MED ORDER — PEGFILGRASTIM-JMDB 6 MG/0.6ML ~~LOC~~ SOSY
PREFILLED_SYRINGE | SUBCUTANEOUS | Status: AC
  Filled 2020-10-27: qty 0.6

## 2020-10-27 MED ORDER — PEGFILGRASTIM-JMDB 6 MG/0.6ML ~~LOC~~ SOSY
6.0000 mg | PREFILLED_SYRINGE | Freq: Once | SUBCUTANEOUS | Status: AC
  Administered 2020-10-27: 6 mg via SUBCUTANEOUS

## 2020-10-27 NOTE — Patient Instructions (Signed)
Pegfilgrastim injection What is this medication? PEGFILGRASTIM (PEG fil gra stim) is a long-acting granulocyte colony-stimulating factor that stimulates the growth of neutrophils, a type of white blood cell important in the body's fight against infection. It is used to reduce the incidence of fever and infection in patients with certain types of cancer who are receiving chemotherapy that affects the bone marrow, and toincrease survival after being exposed to high doses of radiation. This medicine may be used for other purposes; ask your health care provider orpharmacist if you have questions. COMMON BRAND NAME(S): Rexene Edison, Ziextenzo What should I tell my care team before I take this medication? They need to know if you have any of these conditions: kidney disease latex allergy ongoing radiation therapy sickle cell disease skin reactions to acrylic adhesives (On-Body Injector only) an unusual or allergic reaction to pegfilgrastim, filgrastim, other medicines, foods, dyes, or preservatives pregnant or trying to get pregnant breast-feeding How should I use this medication? This medicine is for injection under the skin. If you get this medicine at home, you will be taught how to prepare and give the pre-filled syringe or how to use the On-body Injector. Refer to the patient Instructions for Use for detailed instructions. Use exactly as directed. Tell your healthcare provider immediately if you suspect that the On-body Injector may not have performed as intended or if you suspect the use of the On-body Injector resulted in a missedor partial dose. It is important that you put your used needles and syringes in a special sharps container. Do not put them in a trash can. If you do not have a sharpscontainer, call your pharmacist or healthcare provider to get one. Talk to your pediatrician regarding the use of this medicine in children. Whilethis drug may be prescribed for  selected conditions, precautions do apply. Overdosage: If you think you have taken too much of this medicine contact apoison control center or emergency room at once. NOTE: This medicine is only for you. Do not share this medicine with others. What if I miss a dose? It is important not to miss your dose. Call your doctor or health care professional if you miss your dose. If you miss a dose due to an On-body Injector failure or leakage, a new dose should be administered as soon aspossible using a single prefilled syringe for manual use. What may interact with this medication? Interactions have not been studied. This list may not describe all possible interactions. Give your health care provider a list of all the medicines, herbs, non-prescription drugs, or dietary supplements you use. Also tell them if you smoke, drink alcohol, or use illegaldrugs. Some items may interact with your medicine. What should I watch for while using this medication? Your condition will be monitored carefully while you are receiving thismedicine. You may need blood work done while you are taking this medicine. Talk to your health care provider about your risk of cancer. You may be more atrisk for certain types of cancer if you take this medicine. If you are going to need a MRI, CT scan, or other procedure, tell your doctorthat you are using this medicine (On-Body Injector only). What side effects may I notice from receiving this medication? Side effects that you should report to your doctor or health care professionalas soon as possible: allergic reactions (skin rash, itching or hives, swelling of the face, lips, or tongue) back pain dizziness fever pain, redness, or irritation at site where injected pinpoint red spots on the  skin red or dark-brown urine shortness of breath or breathing problems stomach or side pain, or pain at the shoulder swelling tiredness trouble passing urine or change in the amount of  urine unusual bruising or bleeding Side effects that usually do not require medical attention (report to yourdoctor or health care professional if they continue or are bothersome): bone pain muscle pain This list may not describe all possible side effects. Call your doctor for medical advice about side effects. You may report side effects to FDA at1-800-FDA-1088. Where should I keep my medication? Keep out of the reach of children. If you are using this medicine at home, you will be instructed on how to storeit. Throw away any unused medicine after the expiration date on the label. NOTE: This sheet is a summary. It may not cover all possible information. If you have questions about this medicine, talk to your doctor, pharmacist, orhealth care provider.  2022 Elsevier/Gold Standard (2020-04-07 11:54:14)

## 2020-11-06 NOTE — Progress Notes (Signed)
ASSESSMENT & PLAN:  Debra Rodgers is a 75 y.o. female with an extent IV thoracoabdominal aneurysm in setting of stage IV non-small cell lung cancer for which she is undergoing palliative chemotherapy.  I do not think the aneurysm is the cause of her abdominal symptoms.  A statement from the [place] [facility] [employer] and Society for Vascular Surgery estimated the annual rupture risk according to AAA diameter to be the following: 4.0 cm to 4.9 cm in diameter - 0.5% to 5%  The patient is not a candidate for elective repair of the aneurysm to prevent rupture because it is not yet at size threshold (>5cm in Female). She has a low life expectancy (7-12 mo with Stage IV NSCLC), but has so far had an excellent response to chemotherapy.  Follow up with me in 6 months.  CHIEF COMPLAINT:   aneurysm  HISTORY:  HISTORY OF PRESENT ILLNESS: Debra Rodgers is a 75 y.o. female referred for evaluation of abdominal aortic aneurysm demonstrated on CT scan in the [place] [facility] 04/26/2020.  He has a history of stage IV non-small cell lung cancer for which she is undergoing palliative chemotherapy as directed by Dr. Earlie Server.  She also has a history of DVT.  From an aneurysm perspective, she is asymptomatic.  11/07/20: patient has been undergoing palliative chemotherapy. She seems to be tolerating this well. Continues to have surveillance CT for cancer surveillance.   Past Medical History:  Diagnosis Date   AAA (abdominal aortic aneurysm) (HCC)    COPD (chronic obstructive pulmonary disease) (HCC)    Depression    Essential hypertension    Headache    History of migraine headaches    lung ca dx'd 10/2018   Sleep apnea    Tobacco use disorder     Past Surgical History:  Procedure Laterality Date   ABDOMINAL HYSTERECTOMY     CHOLECYSTECTOMY     IR IMAGING GUIDED PORT INSERTION  02/24/2019   ROTATOR CUFF REPAIR  3/04   VIDEO BRONCHOSCOPY WITH ENDOBRONCHIAL ULTRASOUND  Right 11/25/2018   Procedure: VIDEO BRONCHOSCOPY WITH ENDOBRONCHIAL ULTRASOUND;  Surgeon: Collene Gobble, MD;  Location: [facility] OR;  Service: Thoracic;  Laterality: Right;    Family History  Problem Relation Age of Onset   Hypertension Mother    Stroke Mother    Coronary artery disease Mother    Heart disease Father    Diabetes Sister    Hypertension Sister    Cancer Sister    Breast cancer Sister     Social History   Socioeconomic History   Marital status: Married    Spouse name: Not on file   Number of children: Not on file   Years of education: Not on file   Highest education level: Not on file  Occupational History   Not on file  Tobacco Use   Smoking status: Former    Packs/day: 1.00    Years: 56.00    Pack years: 56.00    Types: Cigarettes    Start date: 1964    Quit date: 11/04/2018    Years since quitting: 2.0   Smokeless tobacco: Never  Vaping Use   Vaping Use: Never used  Substance and Sexual Activity   Alcohol use: No    Alcohol/week: 0.0 standard drinks   Drug use: No   Sexual activity: Never    Partners: Male  Other Topics Concern   Not on file  Social History Narrative  Married, Regular Exercise- yes   Social Determinants of Health   Financial Resource Strain: Not on file  Food Insecurity: Not on file  Transportation Needs: Not on file  Physical Activity: Not on file  Stress: Not on file  Social Connections: Not on file  Intimate Partner Violence: Not on file    Allergies  Allergen Reactions   Codeine Sulfate     REACTION: "makes me high"   Pantoprazole Sodium Swelling and Rash    facial swelling    Current Outpatient Medications  Medication Sig Dispense Refill   acetaminophen (TYLENOL) 500 MG tablet Take 500 mg by mouth every 6 (six) hours as needed for moderate pain.     albuterol (VENTOLIN HFA) 108 (90 Base) MCG/ACT inhaler INHALE 2 PUFFS INTO THE LUNGS EVERY 6 (SIX) HOURS AS NEEDED FOR WHEEZING OR SHORTNESS OF BREATH. 4 each 0    amLODipine (NORVASC) 10 MG tablet TAKE 1 TABLET EVERY DAY 90 tablet 3   atorvastatin (LIPITOR) 20 MG tablet TAKE 1 TABLET EVERY DAY 90 tablet 3   benzonatate (TESSALON) 100 MG capsule TAKE 1 CAPSULE EVERY 8 HOURS AS NEEDED FOR COUGH (Patient taking differently: Take 100 mg by mouth 3 (three) times daily as needed for cough.) 90 capsule 0   chlorthalidone (HYGROTON) 50 MG tablet TAKE 1 TABLET EVERY DAY (Patient taking differently: Take 50 mg by mouth daily.) 90 tablet 3   dexamethasone (DECADRON) 4 MG tablet Please take TWO tablets TWICE a day the day before, the day of, and the day after chemotherapy (Patient taking differently: Take 8 mg by mouth See admin instructions. Please take TWO tablets TWICE a day the day before, the day of, and the day after chemotherapy) 120 tablet 2   famotidine (PEPCID) 20 MG tablet Take 20 mg by mouth daily.      FLUoxetine (PROZAC) 20 MG capsule TAKE 1 CAPSULE EVERY DAY (Patient taking differently: Take 20 mg by mouth daily.) 90 capsule 3   fluticasone (FLONASE) 50 MCG/ACT nasal spray Place 1 spray into both nostrils daily. 11.1 mL 0   gabapentin (NEURONTIN) 100 MG capsule Take 1 capsule (100 mg total) by mouth 2 (two) times daily. 90 capsule 2   HYDROcodone-acetaminophen (NORCO) 7.5-325 MG tablet Take 1 tablet by mouth every 6 (six) hours as needed for moderate pain. 60 tablet 0   lidocaine-prilocaine (EMLA) cream Apply 1 application topically as needed. 30 g 1   magnesium oxide (MAG-OX) 400 MG tablet Take 1 tablet (400 mg total) by mouth daily. 30 tablet 1   polyvinyl alcohol (LIQUIFILM TEARS) 1.4 % ophthalmic solution Place 1 drop into both eyes as needed for dry eyes.     potassium chloride SA (KLOR-CON) 20 MEQ tablet Take 1 tablet (20 mEq total) by mouth daily. 7 tablet 0   tiotropium (SPIRIVA HANDIHALER) 18 MCG inhalation capsule Place 1 capsule (18 mcg total) into inhaler and inhale daily. 30 capsule 2   No current facility-administered medications for this  visit.    REVIEW OF SYSTEMS:  [X]  denotes positive finding, [ ]  denotes negative finding Cardiac  Comments:  Chest pain or chest pressure:    Shortness of breath upon exertion:    Short of breath when lying flat:    Irregular heart rhythm:        Vascular    Pain in calf, thigh, or hip brought on by ambulation:    Pain in feet at night that wakes you up from your sleep:  Blood clot in your veins:    Leg swelling:         Pulmonary    Oxygen at home:    Productive cough:     Wheezing:         Neurologic    Sudden weakness in arms or legs:     Sudden numbness in arms or legs:     Sudden onset of difficulty speaking or slurred speech:    Temporary loss of vision in one eye:     Problems with dizziness:         Gastrointestinal    Blood in stool:     Vomited blood:         Genitourinary    Burning when urinating:     Blood in urine:        Psychiatric    Major depression:         Hematologic    Bleeding problems:    Problems with blood clotting too easily:        Skin    Rashes or ulcers:        Constitutional    Fever or chills:     PHYSICAL EXAM:   Vitals:   11/07/20 0917  BP: 127/83  Pulse: 76  Resp: 20  Temp: 98.2 F (36.8 C)  SpO2: 98%  Weight: 134 lb (60.8 kg)  Height: 5' 1"  (1.549 m)    Constitutional: chronically ill appearing in no distress. Appears adequately nourished.  Neurologic: CN intact. No focal findings. No sensory loss. Psychiatric: Mood and affect symmetric and appropriate. Eyes: No icterus. No conjunctival pallor. Ears, nose, throat: mucous membranes moist. Midline trachea.  Cardiac: regular rate and rhythm.  Respiratory: unlabored. Abdominal: soft, non-tender, non-distended.  Extremity: No edema. No cyanosis. No pallor.  Skin: No gangrene. No ulceration.  Lymphatic: No Stemmer's sign. No palpable lymphadenopathy.   DATA REVIEW:    Most recent CBC CBC Latest Ref Rng & Units 10/25/2020 10/18/2020 10/11/2020  WBC 4.0 -  10.5 K/uL 9.6 11.1(H) 4.4  Hemoglobin 12.0 - 15.0 g/dL 10.3(L) 10.6(L) 10.1(L)  Hematocrit 36.0 - 46.0 % 31.1(L) 32.8(L) 30.1(L)  Platelets 150 - 400 K/uL 215 129(L) 100(L)     Most recent CMP CMP Latest Ref Rng & Units 10/25/2020 10/18/2020 10/11/2020  Glucose 70 - 99 mg/dL 124(H) 88 84  BUN 8 - 23 mg/dL 35(H) 19 16  Creatinine 0.44 - 1.00 mg/dL 0.89 0.81 0.51  Sodium 135 - 145 mmol/L 139 142 140  Potassium 3.5 - 5.1 mmol/L 3.6 3.3(L) 2.9(L)  Chloride 98 - 111 mmol/L 100 104 95(L)  CO2 22 - 32 mmol/L 27 30 34(H)  Calcium 8.9 - 10.3 mg/dL 9.7 9.3 9.2  Total Protein 6.5 - 8.1 g/dL 6.9 6.6 6.3(L)  Total Bilirubin 0.3 - 1.2 mg/dL 0.7 0.7 1.5(H)  Alkaline Phos 38 - 126 U/L 62 87 62  AST 15 - 41 U/L 14(L) 15 16  ALT 0 - 44 U/L 8 <6 11    Renal function Estimated Creatinine Clearance: 48.1 mL/min (by C-G formula based on SCr of 0.89 mg/dL).  Hgb A1c MFr Bld (%)  Date Value  10/13/2017 5.9 (H)    LDL Calculated  Date Value Ref Range Status  10/13/2017 97 0 - 99 mg/dL Final    CT A/P 10/11/20 Personally reviewed.  Unchanged appearance of type IV thoracoabdominal aortic aneurysm.  Radiologist sees no evidence of metastatic disease.  Yevonne Aline. Stanford Breed, MD [facility]  Office Phone Number: [phone] 11/06/2020 4:18 PM

## 2020-11-07 ENCOUNTER — Ambulatory Visit (INDEPENDENT_AMBULATORY_CARE_PROVIDER_SITE_OTHER): Payer: Medicare HMO | Admitting: Vascular Surgery

## 2020-11-07 ENCOUNTER — Other Ambulatory Visit: Payer: Self-pay

## 2020-11-07 ENCOUNTER — Ambulatory Visit (HOSPITAL_COMMUNITY)
Admission: RE | Admit: 2020-11-07 | Discharge: 2020-11-07 | Disposition: A | Discharge status: Home / Self Care | Payer: Medicare HMO | Source: Ambulatory Visit | Attending: Vascular Surgery | Admitting: Vascular Surgery

## 2020-11-07 ENCOUNTER — Encounter: Payer: Self-pay | Admitting: Vascular Surgery

## 2020-11-07 VITALS — BP 127/83 | HR 76 | Temp 98.2°F | Resp 20 | Ht 61.0 in | Wt 134.0 lb

## 2020-11-07 DIAGNOSIS — I716 Thoracoabdominal aortic aneurysm, without rupture, unspecified: Secondary | ICD-10-CM

## 2020-11-07 DIAGNOSIS — I8290 Acute embolism and thrombosis of unspecified vein: Secondary | ICD-10-CM | POA: Insufficient documentation

## 2020-11-08 ENCOUNTER — Other Ambulatory Visit: Payer: Self-pay

## 2020-11-08 DIAGNOSIS — I716 Thoracoabdominal aortic aneurysm, without rupture, unspecified: Secondary | ICD-10-CM

## 2020-11-13 NOTE — Progress Notes (Signed)
[facility] OFFICE PROGRESS NOTE  [name], MD [address] [place] [address]  DIAGNOSIS:  Stage IV non-small cell lung cancer, squamous cell carcinoma. She presented with right upper lobe lung mass in addition to pleural-based metastasis and mediastinal lymphadenopathy. She was diagnosed in September 2020.   PRIOR THERAPY: Chemotherapy with carboplatin for an AUC of 5, paclitaxel 175 mg/m, and Keytruda 200 mg IV every 3 weeks with Neulasta support. Last dose 12/08/19. Status post 18 cycles.  Starting from cycle #5 was on maintenance treatment with single agent Keytruda every 3 weeks. This was discontinued due to evidence of disease progression.   CURRENT THERAPY:  Palliative systemic chemotherapy with docetaxel 75 mg per metered squared and Cyramza 10 mg/kg IV every 3 weeks with Neulasta support. First dose on 01/04/20. Status post 15  cycles. Her dose of docetaxel was reduced to 65 mg/m2 starting from cycle #10 due to nail changes and peripheral neuropathy.   INTERVAL HISTORY: Debra Rodgers 75 y.o. female returns to the clinic today a follow up visit. The patient is feeling fairly well today without any concerning complaints. She has been tolerating her treatment fairly well except she is starting to develop peripheral neuropathy and skin darkening from her treatment. She is taking gabapentin. Therefore, her chemotherapy with docetaxel was reduced to 65 mg/m2 starting from cycle #10. She denies any fever or weight loss. The patient reports her baseline intermittent night sweats.  She reports her baseline dyspnea on exertion and cough. If she has wheezing, she uses her inhaler with improvement in her symptoms.  She denies any hemoptysis.  She denies any nausea, vomiting, or constipation.  She experiences baseline intermittent diarrhea on and off for several years which is unchanged. She denies headaches. She notes that her vision has gotten worse with watching  TV or looking at distances. She is going to try to make an appointment with her eye doctor.  The patient is here for evaluation before starting cycle #16.    MEDICAL HISTORY: Past Medical History:  Diagnosis Date   AAA (abdominal aortic aneurysm) (HCC)    COPD (chronic obstructive pulmonary disease) ([place])    Depression    Essential hypertension    Headache    History of migraine headaches    lung ca dx'd 10/2018   Sleep apnea    Tobacco use disorder     ALLERGIES:  is allergic to codeine sulfate and pantoprazole sodium.  MEDICATIONS:  Current Outpatient Medications  Medication Sig Dispense Refill   acetaminophen (TYLENOL) 500 MG tablet Take 500 mg by mouth every 6 (six) hours as needed for moderate pain.     albuterol (VENTOLIN HFA) 108 (90 Base) MCG/ACT inhaler INHALE 2 PUFFS INTO THE LUNGS EVERY 6 (SIX) HOURS AS NEEDED FOR WHEEZING OR SHORTNESS OF BREATH. 4 each 0   amLODipine (NORVASC) 10 MG tablet TAKE 1 TABLET EVERY DAY 90 tablet 3   atorvastatin (LIPITOR) 20 MG tablet TAKE 1 TABLET EVERY DAY 90 tablet 3   benzonatate (TESSALON) 100 MG capsule TAKE 1 CAPSULE EVERY 8 HOURS AS NEEDED FOR COUGH (Patient taking differently: Take 100 mg by mouth 3 (three) times daily as needed for cough.) 90 capsule 0   chlorthalidone (HYGROTON) 50 MG tablet TAKE 1 TABLET EVERY DAY (Patient taking differently: Take 50 mg by mouth daily.) 90 tablet 3   dexamethasone (DECADRON) 4 MG tablet Please take TWO tablets TWICE a day the day before, the day of, and  the day after chemotherapy (Patient taking differently: Take 8 mg by mouth See admin instructions. Please take TWO tablets TWICE a day the day before, the day of, and the day after chemotherapy) 120 tablet 2   famotidine (PEPCID) 20 MG tablet Take 20 mg by mouth daily.      FLUoxetine (PROZAC) 20 MG capsule TAKE 1 CAPSULE EVERY DAY (Patient taking differently: Take 20 mg by mouth daily.) 90 capsule 3   fluticasone (FLONASE) 50 MCG/ACT nasal spray Place 1  spray into both nostrils daily. 11.1 mL 0   gabapentin (NEURONTIN) 100 MG capsule Take 1 capsule (100 mg total) by mouth 2 (two) times daily. 90 capsule 2   HYDROcodone-acetaminophen (NORCO) 7.5-325 MG tablet Take 1 tablet by mouth every 6 (six) hours as needed for moderate pain. 60 tablet 0   lidocaine-prilocaine (EMLA) cream Apply 1 application topically as needed. 30 g 1   magnesium oxide (MAG-OX) 400 MG tablet Take 1 tablet (400 mg total) by mouth daily. 30 tablet 1   polyvinyl alcohol (LIQUIFILM TEARS) 1.4 % ophthalmic solution Place 1 drop into both eyes as needed for dry eyes.     potassium chloride SA (KLOR-CON) 20 MEQ tablet Take 1 tablet (20 mEq total) by mouth 2 (two) times daily. 14 tablet 0   tiotropium (SPIRIVA HANDIHALER) 18 MCG inhalation capsule Place 1 capsule (18 mcg total) into inhaler and inhale daily. 30 capsule 2   No current facility-administered medications for this visit.    SURGICAL HISTORY:  Past Surgical History:  Procedure Laterality Date   ABDOMINAL HYSTERECTOMY     CHOLECYSTECTOMY     IR IMAGING GUIDED PORT INSERTION  02/24/2019   ROTATOR CUFF REPAIR  3/04   VIDEO BRONCHOSCOPY WITH ENDOBRONCHIAL ULTRASOUND Right 11/25/2018   Procedure: VIDEO BRONCHOSCOPY WITH ENDOBRONCHIAL ULTRASOUND;  Surgeon: Collene Gobble, MD;  Location: [facility] OR;  Service: Thoracic;  Laterality: Right;    REVIEW OF SYSTEMS:   Constitutional: Positive for fatigue. Negative for weight loss and fever HENT:  Negative for nosebleeds, sore throat and trouble swallowing.   Eyes: Negative for eye problems and icterus.  Respiratory: Positive for baseline cough and baseline dyspnea on exertion. Positive for intermittent wheezing (none at this time). Negative for hemoptysis. Cardiovascular: Negative for chest pain and leg swelling.  Gastrointestinal: Positive for baseline intermittent diarrhea (controlled). Negative for abdominal pain, constipation, nausea and vomiting. Genitourinary: Negative for  bladder incontinence, difficulty urinating, dysuria, frequency and hematuria.   Musculoskeletal: Negative for back pain, gait problem, neck pain and neck stiffness.  Skin: Positive for nail darkening and falling off.  Negative for itching and rash.  Neurological: Negative for dizziness, headaches, extremity weakness, gait problem, light-headedness and seizures.  Hematological: Negative for adenopathy. Does not bruise/bleed easily.  Psychiatric/Behavioral: Negative for confusion, depression and sleep disturbance. The patient is not nervous/anxious.   PHYSICAL EXAMINATION:  Blood pressure (!) 133/96, pulse 92, temperature 98.1 F (36.7 C), temperature source Oral, resp. rate 18, weight 139 lb 8 oz (63.3 kg), SpO2 97 %.  ECOG PERFORMANCE STATUS: 1  Physical Exam  Constitutional: Oriented to person, place, and time and well-developed, well-nourished, and in no distress.  HENT:  Head: Normocephalic and atraumatic.  Mouth/Throat: Oropharynx is clear and moist. No oropharyngeal exudate.  Eyes: Conjunctivae are normal. Right eye exhibits no discharge. Left eye exhibits no discharge. No scleral icterus.  Neck: Normal range of motion. Neck supple.  Cardiovascular: Normal rate, regular rhythm, normal heart sounds and intact distal pulses.  Pulmonary/Chest: Effort normal and breath sounds normal. No respiratory distress. No wheezes. No rales.  Abdominal: Soft. Bowel sounds are normal. Exhibits no distension and no mass. There is no tenderness.  Musculoskeletal: Normal range of motion. Exhibits no edema.  Lymphadenopathy:    No cervical adenopathy.  Neurological: Alert and oriented to person, place, and time. Exhibits normal muscle tone. Gait normal. Coordination normal.  Skin: Darkening and cracking of finger nails. Skin is warm and dry. No rash noted. Not diaphoretic. No erythema. No pallor.  Psychiatric: Mood, memory and judgment normal.  Vitals reviewed.  LABORATORY DATA: Lab Results   Component Value Date   WBC 7.7 11/15/2020   HGB 10.3 (L) 11/15/2020   HCT 31.1 (L) 11/15/2020   MCV 99.7 11/15/2020   PLT 220 11/15/2020      Chemistry      Component Value Date/Time   NA 139 11/15/2020 0918   NA 139 07/21/2017 0958   K 3.0 (L) 11/15/2020 0918   CL 99 11/15/2020 0918   CO2 28 11/15/2020 0918   BUN 27 (H) 11/15/2020 0918   BUN 26 07/21/2017 0958   CREATININE 0.90 11/15/2020 0918   CREATININE 0.79 09/03/2013 1540      Component Value Date/Time   CALCIUM 9.5 11/15/2020 0918   ALKPHOS 55 11/15/2020 0918   AST 14 (L) 11/15/2020 0918   ALT 7 11/15/2020 0918   BILITOT 0.7 11/15/2020 0918       RADIOGRAPHIC STUDIES:  VAS [place] AAA DUPLEX  Result Date: 11/07/2020 ABDOMINAL AORTA STUDY Patient Name:  [facility]  Date of Exam:   11/07/2020 Medical Rec #: [id number]      Accession #:    [id number] Date of Birth: November 06, 1945      Patient Gender: F Patient Age:   75 years Exam Location:  [facility] Procedure:      VAS [place] AAA DUPLEX Referring Phys: Jamelle Haring --------------------------------------------------------------------------------  Indications: Follow up exam for known AAA. Other Factors: 10/10/20 CTA abdomen: mid aorta 4.7 cm.  Performing Technologist: June Leap RDMS, RVT  Examination Guidelines: A complete evaluation includes B-mode imaging, spectral Doppler, color Doppler, and power Doppler as needed of all accessible portions of each vessel. Bilateral testing is considered an integral part of a complete examination. Limited examinations for reoccurring indications may be performed as noted.  Abdominal Aorta Findings: +-----------+-------+----------+----------+--------+--------+--------+ Location   AP (cm)Trans (cm)PSV (cm/s)WaveformThrombusComments +-----------+-------+----------+----------+--------+--------+--------+ Proximal   2.95   3.51      39                                  +-----------+-------+----------+----------+--------+--------+--------+ Mid        4.85   4.77      48                                 +-----------+-------+----------+----------+--------+--------+--------+ Distal     3.19   3.08      88                                 +-----------+-------+----------+----------+--------+--------+--------+ RT CIA Prox1.5    1.9       43                                 +-----------+-------+----------+----------+--------+--------+--------+  LT CIA Prox1.5    1.5       47                                 +-----------+-------+----------+----------+--------+--------+--------+  Summary: Abdominal Aorta: There is evidence of abnormal dilatation of the mid Abdominal aorta.  *See table(s) above for measurements and observations.  Electronically signed by Jamelle Haring on 11/07/2020 at 51:35:39 AM.    Final      ASSESSMENT/PLAN:  This is a very pleasant 75 year old African-American female diagnosed with stage IV non-small cell lung cancer, squamous cell carcinoma.  She presented with a right upper lobe lung mass and pleural based metastases and mediastinal lymphadenopathy. She was diagnosed in August 2020    The patient was on systemic chemotherapy with carboplatin for an AUC of 5, paclitaxel 175 mg/m, and Keytruda 200 mg IV every 3 weeks with Neulasta support.  Starting from cycle #5, she was on maintenance with single agent Keytruda.  She is status post 18 cycles of treatment and she tolerated it well without any concerning adverse effects except for mild fatigue. This was discontinued secondary to evidence of disease progression.   The patient is currently undergoing palliative systemic chemotherapy with docetaxel 75 mg per metered squared and Cyramza 10 mg/kg IV every 3 weeks with Neulasta support. She is status post 15 cycles and she is tolerating this well. She has some nail darkening and chipping of her nails. Her dose of docetaxel was reduced to 65  mg/m2 starting from cycle #10 due to peripheral neuropathy.     Labs were reviewed. Recommend she proceed with cycle #16 today as scheduled.    We will see her back for a follow up visit in 3 weeks for evaluation before starting cycle #17.    She will continue to take gabapentin for her peripheral neuropathy.   I will refill her potassium since her potassium is low at 3.0 today.   The patient was advised to call immediately if she has any concerning symptoms in the interval. The patient voices understanding of current disease status and treatment options and is in agreement with the current care plan. All questions were answered. The patient knows to call the clinic with any problems, questions or concerns. We can certainly see the patient much sooner if necessary    No orders of the defined types were placed in this encounter.    The total time spent in the appointment was 20-29 minutes.   Temprence Rhines L Morine Kohlman, PA-C 11/15/20

## 2020-11-15 ENCOUNTER — Inpatient Hospital Stay: Payer: Medicare HMO

## 2020-11-15 ENCOUNTER — Inpatient Hospital Stay (HOSPITAL_BASED_OUTPATIENT_CLINIC_OR_DEPARTMENT_OTHER): Payer: Medicare HMO | Admitting: Physician Assistant

## 2020-11-15 ENCOUNTER — Other Ambulatory Visit: Payer: Self-pay

## 2020-11-15 VITALS — BP 133/96 | HR 92 | Temp 98.1°F | Resp 18 | Wt 139.5 lb

## 2020-11-15 DIAGNOSIS — C3491 Malignant neoplasm of unspecified part of right bronchus or lung: Secondary | ICD-10-CM | POA: Diagnosis not present

## 2020-11-15 DIAGNOSIS — Z5111 Encounter for antineoplastic chemotherapy: Secondary | ICD-10-CM

## 2020-11-15 DIAGNOSIS — E876 Hypokalemia: Secondary | ICD-10-CM | POA: Diagnosis not present

## 2020-11-15 DIAGNOSIS — I10 Essential (primary) hypertension: Secondary | ICD-10-CM | POA: Diagnosis not present

## 2020-11-15 DIAGNOSIS — C782 Secondary malignant neoplasm of pleura: Secondary | ICD-10-CM | POA: Diagnosis not present

## 2020-11-15 DIAGNOSIS — I714 Abdominal aortic aneurysm, without rupture: Secondary | ICD-10-CM | POA: Diagnosis not present

## 2020-11-15 DIAGNOSIS — C771 Secondary and unspecified malignant neoplasm of intrathoracic lymph nodes: Secondary | ICD-10-CM | POA: Diagnosis not present

## 2020-11-15 DIAGNOSIS — G629 Polyneuropathy, unspecified: Secondary | ICD-10-CM | POA: Diagnosis not present

## 2020-11-15 DIAGNOSIS — C3411 Malignant neoplasm of upper lobe, right bronchus or lung: Secondary | ICD-10-CM | POA: Diagnosis not present

## 2020-11-15 DIAGNOSIS — Z5189 Encounter for other specified aftercare: Secondary | ICD-10-CM | POA: Diagnosis not present

## 2020-11-15 DIAGNOSIS — Z95828 Presence of other vascular implants and grafts: Secondary | ICD-10-CM

## 2020-11-15 DIAGNOSIS — Z5112 Encounter for antineoplastic immunotherapy: Secondary | ICD-10-CM | POA: Diagnosis not present

## 2020-11-15 LAB — CBC WITH DIFFERENTIAL (CANCER CENTER ONLY)
Abs Immature Granulocytes: 0.08 10*3/uL — ABNORMAL HIGH (ref 0.00–0.07)
Basophils Absolute: 0 10*3/uL (ref 0.0–0.1)
Basophils Relative: 0 %
Eosinophils Absolute: 0 10*3/uL (ref 0.0–0.5)
Eosinophils Relative: 0 %
HCT: 31.1 % — ABNORMAL LOW (ref 36.0–46.0)
Hemoglobin: 10.3 g/dL — ABNORMAL LOW (ref 12.0–15.0)
Immature Granulocytes: 1 %
Lymphocytes Relative: 11 %
Lymphs Abs: 0.8 10*3/uL (ref 0.7–4.0)
MCH: 33 pg (ref 26.0–34.0)
MCHC: 33.1 g/dL (ref 30.0–36.0)
MCV: 99.7 fL (ref 80.0–100.0)
Monocytes Absolute: 0.2 10*3/uL (ref 0.1–1.0)
Monocytes Relative: 2 %
Neutro Abs: 6.6 10*3/uL (ref 1.7–7.7)
Neutrophils Relative %: 86 %
Platelet Count: 220 10*3/uL (ref 150–400)
RBC: 3.12 MIL/uL — ABNORMAL LOW (ref 3.87–5.11)
RDW: 17.9 % — ABNORMAL HIGH (ref 11.5–15.5)
WBC Count: 7.7 10*3/uL (ref 4.0–10.5)
nRBC: 0 % (ref 0.0–0.2)

## 2020-11-15 LAB — CMP (CANCER CENTER ONLY)
ALT: 7 U/L (ref 0–44)
AST: 14 U/L — ABNORMAL LOW (ref 15–41)
Albumin: 3.7 g/dL (ref 3.5–5.0)
Alkaline Phosphatase: 55 U/L (ref 38–126)
Anion gap: 12 (ref 5–15)
BUN: 27 mg/dL — ABNORMAL HIGH (ref 8–23)
CO2: 28 mmol/L (ref 22–32)
Calcium: 9.5 mg/dL (ref 8.9–10.3)
Chloride: 99 mmol/L (ref 98–111)
Creatinine: 0.9 mg/dL (ref 0.44–1.00)
GFR, Estimated: >60 mL/min (ref >60)
Glucose, Bld: 129 mg/dL — ABNORMAL HIGH (ref 70–99)
Potassium: 3 mmol/L — ABNORMAL LOW (ref 3.5–5.1)
Sodium: 139 mmol/L (ref 135–145)
Total Bilirubin: 0.7 mg/dL (ref 0.3–1.2)
Total Protein: 6.7 g/dL (ref 6.5–8.1)

## 2020-11-15 LAB — TSH: TSH: 1.158 u[IU]/mL (ref 0.308–3.960)

## 2020-11-15 LAB — TOTAL PROTEIN, URINE DIPSTICK: Protein, ur: NEGATIVE mg/dL

## 2020-11-15 MED ORDER — DIPHENHYDRAMINE HCL 50 MG/ML IJ SOLN
50.0000 mg | Freq: Once | INTRAMUSCULAR | Status: AC
  Administered 2020-11-15: 50 mg via INTRAVENOUS
  Filled 2020-11-15: qty 1

## 2020-11-15 MED ORDER — SODIUM CHLORIDE 0.9 % IV SOLN
65.0000 mg/m2 | Freq: Once | INTRAVENOUS | Status: AC
  Administered 2020-11-15: 110 mg via INTRAVENOUS
  Filled 2020-11-15: qty 11

## 2020-11-15 MED ORDER — SODIUM CHLORIDE 0.9 % IV SOLN
10.0000 mg | Freq: Once | INTRAVENOUS | Status: AC
  Administered 2020-11-15: 10 mg via INTRAVENOUS
  Filled 2020-11-15: qty 10

## 2020-11-15 MED ORDER — SODIUM CHLORIDE 0.9% FLUSH
10.0000 mL | INTRAVENOUS | Status: DC | PRN
  Administered 2020-11-15: 10 mL

## 2020-11-15 MED ORDER — ACETAMINOPHEN 325 MG PO TABS
650.0000 mg | ORAL_TABLET | Freq: Once | ORAL | Status: AC
  Administered 2020-11-15: 650 mg via ORAL
  Filled 2020-11-15: qty 2

## 2020-11-15 MED ORDER — POTASSIUM CHLORIDE CRYS ER 20 MEQ PO TBCR: 20.0000 meq | EXTENDED_RELEASE_TABLET | Freq: Two times a day (BID) | ORAL | 0 refills | Status: DC

## 2020-11-15 MED ORDER — SODIUM CHLORIDE 0.9% FLUSH
10.0000 mL | INTRAVENOUS | Status: DC | PRN
Start: 2020-11-15 — End: 2020-11-15
  Administered 2020-11-15: 10 mL

## 2020-11-15 MED ORDER — HEPARIN SOD (PORK) LOCK FLUSH 100 UNIT/ML IV SOLN
500.0000 [IU] | Freq: Once | INTRAVENOUS | Status: AC | PRN
  Administered 2020-11-15: 500 [IU]

## 2020-11-15 MED ORDER — SODIUM CHLORIDE 0.9 % IV SOLN
10.0000 mg/kg | Freq: Once | INTRAVENOUS | Status: AC
  Administered 2020-11-15: 600 mg via INTRAVENOUS
  Filled 2020-11-15: qty 50

## 2020-11-15 MED ORDER — SODIUM CHLORIDE 0.9 % IV SOLN: Freq: Once | INTRAVENOUS | Status: AC

## 2020-11-15 NOTE — Patient Instructions (Signed)
[facility]  Discharge Instructions: Thank you for choosing [facility] to provide your oncology and hematology care.   If you have a lab appointment with the [facility], please go directly to the [facility] and check in at the registration area.   Wear comfortable clothing and clothing appropriate for easy access to any Portacath or PICC line.   We strive to give you quality time with your provider. You may need to reschedule your appointment if you arrive late (15 or more minutes).  Arriving late affects you and other patients whose appointments are after yours.  Also, if you miss three or more appointments without notifying the office, you may be dismissed from the clinic at the provider's discretion.      For prescription refill requests, have your pharmacy contact our office and allow 72 hours for refills to be completed.    Today you received the following chemotherapy and/or immunotherapy agents cyramza, docetaxel      To help prevent nausea and vomiting after your treatment, we encourage you to take your nausea medication as directed.  BELOW ARE SYMPTOMS THAT SHOULD BE REPORTED IMMEDIATELY: *FEVER GREATER THAN 100.4 F (38 C) OR HIGHER *CHILLS OR SWEATING *NAUSEA AND VOMITING THAT IS NOT CONTROLLED WITH YOUR NAUSEA MEDICATION *UNUSUAL SHORTNESS OF BREATH *UNUSUAL BRUISING OR BLEEDING *URINARY PROBLEMS (pain or burning when urinating, or frequent urination) *BOWEL PROBLEMS (unusual diarrhea, constipation, pain near the anus) TENDERNESS IN MOUTH AND THROAT WITH OR WITHOUT PRESENCE OF ULCERS (sore throat, sores in mouth, or a toothache) UNUSUAL RASH, SWELLING OR PAIN  UNUSUAL VAGINAL DISCHARGE OR ITCHING   Items with * indicate a potential emergency and should be followed up as soon as possible or go to the Emergency Department if any problems should occur.  Please show the CHEMOTHERAPY ALERT CARD or IMMUNOTHERAPY ALERT CARD at  check-in to the Emergency Department and triage nurse.  Should you have questions after your visit or need to cancel or reschedule your appointment, please contact [facility]  Dept: [phone]  and follow the prompts.  Office hours are 8:00 a.m. to 4:30 p.m. Monday - Friday. Please note that voicemails left after 4:00 p.m. may not be returned until the following business day.  We are closed weekends and major holidays. You have access to a nurse at all times for urgent questions. Please call the main number to the clinic Dept: [phone] and follow the prompts.   For any non-urgent questions, you may also contact your provider using MyChart. We now offer e-Visits for anyone 30 and older to request care online for non-urgent symptoms. For details visit [email or web address].   Also download the MyChart app! Go to the app store, search "MyChart", open the app, select [facility], and log in with your MyChart username and password.  Due to Covid, a mask is required upon entering the hospital/clinic. If you do not have a mask, one will be given to you upon arrival. For doctor visits, patients may have 1 support person aged 69 or older with them. For treatment visits, patients cannot have anyone with them due to current Covid guidelines and our immunocompromised population.

## 2020-11-17 ENCOUNTER — Inpatient Hospital Stay: Payer: Medicare HMO

## 2020-11-17 ENCOUNTER — Other Ambulatory Visit: Payer: Self-pay

## 2020-11-17 DIAGNOSIS — C782 Secondary malignant neoplasm of pleura: Secondary | ICD-10-CM | POA: Diagnosis not present

## 2020-11-17 DIAGNOSIS — C3491 Malignant neoplasm of unspecified part of right bronchus or lung: Secondary | ICD-10-CM

## 2020-11-17 DIAGNOSIS — I714 Abdominal aortic aneurysm, without rupture: Secondary | ICD-10-CM | POA: Diagnosis not present

## 2020-11-17 DIAGNOSIS — C3411 Malignant neoplasm of upper lobe, right bronchus or lung: Secondary | ICD-10-CM | POA: Diagnosis not present

## 2020-11-17 DIAGNOSIS — Z5112 Encounter for antineoplastic immunotherapy: Secondary | ICD-10-CM | POA: Diagnosis not present

## 2020-11-17 DIAGNOSIS — C771 Secondary and unspecified malignant neoplasm of intrathoracic lymph nodes: Secondary | ICD-10-CM | POA: Diagnosis not present

## 2020-11-17 DIAGNOSIS — G629 Polyneuropathy, unspecified: Secondary | ICD-10-CM | POA: Diagnosis not present

## 2020-11-17 DIAGNOSIS — Z5189 Encounter for other specified aftercare: Secondary | ICD-10-CM | POA: Diagnosis not present

## 2020-11-17 DIAGNOSIS — Z5111 Encounter for antineoplastic chemotherapy: Secondary | ICD-10-CM | POA: Diagnosis not present

## 2020-11-17 DIAGNOSIS — I10 Essential (primary) hypertension: Secondary | ICD-10-CM | POA: Diagnosis not present

## 2020-11-17 MED ORDER — PEGFILGRASTIM-JMDB 6 MG/0.6ML ~~LOC~~ SOSY
6.0000 mg | PREFILLED_SYRINGE | Freq: Once | SUBCUTANEOUS | Status: AC
  Administered 2020-11-17: 6 mg via SUBCUTANEOUS
  Filled 2020-11-17: qty 0.6

## 2020-11-20 ENCOUNTER — Telehealth: Payer: Self-pay

## 2020-11-20 NOTE — Telephone Encounter (Signed)
Pt LM c/o gas and bloating.  Discussed with Cassandra PA-C who advised pt can take Gas-X and make sure to avoid gas-producing foods, such as beans, onions, cabbage, broccoli, wheat, potatoes, etc.  I have spoken with the pt and advised as indicated. She expressed understanding of this information.

## 2020-11-22 DIAGNOSIS — G4733 Obstructive sleep apnea (adult) (pediatric): Secondary | ICD-10-CM | POA: Diagnosis not present

## 2020-11-30 ENCOUNTER — Encounter: Payer: Self-pay | Admitting: *Deleted

## 2020-11-30 NOTE — Progress Notes (Signed)

## 2020-12-04 NOTE — Progress Notes (Signed)
Things That May Be Affecting Your Health:  Alcohol  Hearing loss  Pain    Depression x Home Safety  Sexual Health   Diabetes  Lack of physical activity  Stress   Difficulty with daily activities  Loneliness  Tiredness   Drug use x Medicines x Tobacco use   Falls  Motor Vehicle Safety  Weight   Food choices  Oral Health  Other    YOUR PERSONALIZED HEALTH PLAN : 1. Schedule your next subsequent Medicare Wellness visit in one year 2. Attend all of your regular appointments to address your medical issues 3. Complete the preventative screenings and services   Annual Wellness Visit   Medicare Covered Preventative Screenings and [place] Men and Women Who How Often Need? Date of Last Service Action  Abdominal Aortic Aneurysm Adults with AAA risk factors Once      Alcohol Misuse and Counseling All Adults Screening once a year if no alcohol misuse. Counseling up to 4 face to face sessions.     Bone Density Measurement  Adults at risk for osteoporosis Once every 2 yrs      Lipid Panel Z13.6 All adults without CV disease Once every 5 yrs       Colorectal Cancer  Stool sample or Colonoscopy All adults 43 and older  Once every year Every 10 years        Depression All Adults Once a year  Today   Diabetes Screening Blood glucose, post glucose load, or GTT Z13.1 All adults at risk Pre-diabetics Once per year Twice per year      Diabetes  Self-Management Training All adults Diabetics 10 hrs first year; 2 hours subsequent years. Requires Copay     Glaucoma Diabetics Family history of glaucoma African Americans 11 yrs + Hispanic Americans 58 yrs + Annually - requires coppay      Hepatitis C Z72.89 or F19.20 High Risk for HCV Born between 1945 and 1965 Annually Once      HIV Z11.4 All adults based on risk Annually btw ages 39 & 85 regardless of risk Annually > 65 yrs if at increased risk      Lung Cancer Screening Asymptomatic adults aged 54-77 with 30  pack yr history and current smoker OR quit within the last 15 yrs Annually Must have counseling and shared decision making documentation before first screen      Medical Nutrition Therapy Adults with  Diabetes Renal disease Kidney transplant within past 3 yrs 3 hours first year; 2 hours subsequent years     Obesity and Counseling All adults Screening once a year Counseling if BMI 30 or higher  Today   Tobacco Use Counseling Adults who use tobacco  Up to 8 visits in one year     Vaccines Z23 Hepatitis B Influenza  Pneumonia  Adults  Once Once every flu season Two different vaccines separated by one year     Next Annual Wellness Visit People with Medicare Every year  Today     Services & Screenings Women Who How Often Need  Date of Last Service Action  Mammogram  Z12.31 Women over 59 One baseline ages 6-39. Annually ager 40 yrs+      Pap tests All women Annually if high risk. Every 2 yrs for normal risk women      Screening for cervical cancer with  Pap (Z01.419 nl or Z01.411abnl) & HPV Z11.51 Women aged 51 to 62 Once every 5 yrs  Screening pelvic and breast exams All women Annually if high risk. Every 2 yrs for normal risk women     Sexually Transmitted Diseases Chlamydia Gonorrhea Syphilis All at risk adults Annually for non pregnant females at increased risk         [place] Men Who How Ofter Need  Date of Last Service Action  Prostate Cancer - DRE & PSA Men over 50 Annually.  DRE might require a copay.        Sexually Transmitted Diseases Syphilis All at risk adults Annually for men at increased risk      Health Maintenance List Health Maintenance  Topic Date Due   Zoster Vaccines- Shingrix (1 of 2) Never done   COLONOSCOPY (Pts 45-18yr Insurance coverage will need to be confirmed)  10/18/2018   COVID-19 Vaccine (3 - Pfizer risk series) 08/16/2019   INFLUENZA VACCINE  10/23/2020   TETANUS/TDAP  05/02/2021   MAMMOGRAM  12/15/2021   DEXA  SCAN  Completed   Hepatitis C Screening  Completed   PNA vac Low Risk Adult  Completed   HPV VACCINES  Aged Out    Little benefit to screening or most other interventions given her advanced lung cancer. But checking in on home situation, safety, and mood would be helpful.

## 2020-12-06 ENCOUNTER — Encounter: Payer: Self-pay | Admitting: Internal Medicine

## 2020-12-06 ENCOUNTER — Other Ambulatory Visit: Payer: Self-pay

## 2020-12-06 ENCOUNTER — Inpatient Hospital Stay: Payer: Medicare HMO

## 2020-12-06 ENCOUNTER — Inpatient Hospital Stay: Payer: Medicare HMO | Attending: Internal Medicine | Admitting: Internal Medicine

## 2020-12-06 VITALS — BP 154/87 | HR 97 | Temp 96.5°F | Resp 20 | Ht 61.0 in | Wt 141.7 lb

## 2020-12-06 DIAGNOSIS — Z5112 Encounter for antineoplastic immunotherapy: Secondary | ICD-10-CM | POA: Insufficient documentation

## 2020-12-06 DIAGNOSIS — C782 Secondary malignant neoplasm of pleura: Secondary | ICD-10-CM | POA: Diagnosis not present

## 2020-12-06 DIAGNOSIS — C3490 Malignant neoplasm of unspecified part of unspecified bronchus or lung: Secondary | ICD-10-CM

## 2020-12-06 DIAGNOSIS — G4730 Sleep apnea, unspecified: Secondary | ICD-10-CM | POA: Diagnosis not present

## 2020-12-06 DIAGNOSIS — I10 Essential (primary) hypertension: Secondary | ICD-10-CM | POA: Diagnosis not present

## 2020-12-06 DIAGNOSIS — C3491 Malignant neoplasm of unspecified part of right bronchus or lung: Secondary | ICD-10-CM | POA: Diagnosis not present

## 2020-12-06 DIAGNOSIS — J449 Chronic obstructive pulmonary disease, unspecified: Secondary | ICD-10-CM | POA: Diagnosis not present

## 2020-12-06 DIAGNOSIS — F17210 Nicotine dependence, cigarettes, uncomplicated: Secondary | ICD-10-CM | POA: Insufficient documentation

## 2020-12-06 DIAGNOSIS — I714 Abdominal aortic aneurysm, without rupture: Secondary | ICD-10-CM | POA: Diagnosis not present

## 2020-12-06 DIAGNOSIS — R531 Weakness: Secondary | ICD-10-CM | POA: Diagnosis not present

## 2020-12-06 DIAGNOSIS — C3411 Malignant neoplasm of upper lobe, right bronchus or lung: Secondary | ICD-10-CM | POA: Diagnosis not present

## 2020-12-06 DIAGNOSIS — Z79899 Other long term (current) drug therapy: Secondary | ICD-10-CM | POA: Insufficient documentation

## 2020-12-06 DIAGNOSIS — E876 Hypokalemia: Secondary | ICD-10-CM | POA: Diagnosis not present

## 2020-12-06 DIAGNOSIS — Z9221 Personal history of antineoplastic chemotherapy: Secondary | ICD-10-CM | POA: Insufficient documentation

## 2020-12-06 DIAGNOSIS — Z95828 Presence of other vascular implants and grafts: Secondary | ICD-10-CM

## 2020-12-06 DIAGNOSIS — Z5111 Encounter for antineoplastic chemotherapy: Secondary | ICD-10-CM | POA: Diagnosis not present

## 2020-12-06 LAB — CBC WITH DIFFERENTIAL (CANCER CENTER ONLY)
Abs Immature Granulocytes: 0.06 10*3/uL (ref 0.00–0.07)
Basophils Absolute: 0 10*3/uL (ref 0.0–0.1)
Basophils Relative: 0 %
Eosinophils Absolute: 0 10*3/uL (ref 0.0–0.5)
Eosinophils Relative: 0 %
HCT: 32.1 % — ABNORMAL LOW (ref 36.0–46.0)
Hemoglobin: 10.7 g/dL — ABNORMAL LOW (ref 12.0–15.0)
Immature Granulocytes: 1 %
Lymphocytes Relative: 11 %
Lymphs Abs: 0.9 10*3/uL (ref 0.7–4.0)
MCH: 33.1 pg (ref 26.0–34.0)
MCHC: 33.3 g/dL (ref 30.0–36.0)
MCV: 99.4 fL (ref 80.0–100.0)
Monocytes Absolute: 0.2 10*3/uL (ref 0.1–1.0)
Monocytes Relative: 2 %
Neutro Abs: 7.1 10*3/uL (ref 1.7–7.7)
Neutrophils Relative %: 86 %
Platelet Count: 231 10*3/uL (ref 150–400)
RBC: 3.23 MIL/uL — ABNORMAL LOW (ref 3.87–5.11)
RDW: 18 % — ABNORMAL HIGH (ref 11.5–15.5)
WBC Count: 8.3 10*3/uL (ref 4.0–10.5)
nRBC: 0 % (ref 0.0–0.2)

## 2020-12-06 LAB — CMP (CANCER CENTER ONLY)
ALT: 7 U/L (ref 0–44)
AST: 15 U/L (ref 15–41)
Albumin: 3.7 g/dL (ref 3.5–5.0)
Alkaline Phosphatase: 54 U/L (ref 38–126)
Anion gap: 9 (ref 5–15)
BUN: 24 mg/dL — ABNORMAL HIGH (ref 8–23)
CO2: 30 mmol/L (ref 22–32)
Calcium: 9.6 mg/dL (ref 8.9–10.3)
Chloride: 100 mmol/L (ref 98–111)
Creatinine: 0.81 mg/dL (ref 0.44–1.00)
GFR, Estimated: >60 mL/min (ref >60)
Glucose, Bld: 137 mg/dL — ABNORMAL HIGH (ref 70–99)
Potassium: 3.1 mmol/L — ABNORMAL LOW (ref 3.5–5.1)
Sodium: 139 mmol/L (ref 135–145)
Total Bilirubin: 0.7 mg/dL (ref 0.3–1.2)
Total Protein: 6.6 g/dL (ref 6.5–8.1)

## 2020-12-06 LAB — TOTAL PROTEIN, URINE DIPSTICK: Protein, ur: NEGATIVE mg/dL

## 2020-12-06 MED ORDER — SODIUM CHLORIDE 0.9 % IV SOLN
10.0000 mg | Freq: Once | INTRAVENOUS | Status: AC
  Administered 2020-12-06: 10 mg via INTRAVENOUS
  Filled 2020-12-06: qty 10

## 2020-12-06 MED ORDER — SODIUM CHLORIDE 0.9 % IV SOLN: Freq: Once | INTRAVENOUS | Status: AC

## 2020-12-06 MED ORDER — SODIUM CHLORIDE 0.9% FLUSH
10.0000 mL | INTRAVENOUS | Status: DC | PRN
Start: 2020-12-06 — End: 2020-12-06
  Administered 2020-12-06: 10 mL

## 2020-12-06 MED ORDER — POTASSIUM CHLORIDE CRYS ER 20 MEQ PO TBCR: 20.0000 meq | EXTENDED_RELEASE_TABLET | Freq: Two times a day (BID) | ORAL | 0 refills | Status: DC

## 2020-12-06 MED ORDER — HEPARIN SOD (PORK) LOCK FLUSH 100 UNIT/ML IV SOLN
500.0000 [IU] | Freq: Once | INTRAVENOUS | Status: AC | PRN
  Administered 2020-12-06: 500 [IU]

## 2020-12-06 MED ORDER — DIPHENHYDRAMINE HCL 50 MG/ML IJ SOLN
50.0000 mg | Freq: Once | INTRAMUSCULAR | Status: AC
Start: 2020-12-06 — End: 2020-12-06
  Administered 2020-12-06: 50 mg via INTRAVENOUS
  Filled 2020-12-06: qty 1

## 2020-12-06 MED ORDER — SODIUM CHLORIDE 0.9% FLUSH
10.0000 mL | INTRAVENOUS | Status: DC | PRN
  Administered 2020-12-06: 10 mL

## 2020-12-06 MED ORDER — SODIUM CHLORIDE 0.9 % IV SOLN
65.0000 mg/m2 | Freq: Once | INTRAVENOUS | Status: AC
  Administered 2020-12-06: 110 mg via INTRAVENOUS
  Filled 2020-12-06: qty 11

## 2020-12-06 MED ORDER — SODIUM CHLORIDE 0.9 % IV SOLN
10.0000 mg/kg | Freq: Once | INTRAVENOUS | Status: AC
  Administered 2020-12-06: 600 mg via INTRAVENOUS
  Filled 2020-12-06: qty 50

## 2020-12-06 MED ORDER — ACETAMINOPHEN 325 MG PO TABS
650.0000 mg | ORAL_TABLET | Freq: Once | ORAL | Status: AC
  Administered 2020-12-06: 650 mg via ORAL
  Filled 2020-12-06: qty 2

## 2020-12-06 NOTE — Patient Instructions (Signed)
[facility]  Discharge Instructions: Thank you for choosing [facility] to provide your oncology and hematology care.   If you have a lab appointment with the [facility], please go directly to the [facility] and check in at the registration area.   Wear comfortable clothing and clothing appropriate for easy access to any Portacath or PICC line.   We strive to give you quality time with your provider. You may need to reschedule your appointment if you arrive late (15 or more minutes).  Arriving late affects you and other patients whose appointments are after yours.  Also, if you miss three or more appointments without notifying the office, you may be dismissed from the clinic at the provider's discretion.      For prescription refill requests, have your pharmacy contact our office and allow 72 hours for refills to be completed.    Today you received the following chemotherapy and/or immunotherapy agents cyramza, docetaxel      To help prevent nausea and vomiting after your treatment, we encourage you to take your nausea medication as directed.  BELOW ARE SYMPTOMS THAT SHOULD BE REPORTED IMMEDIATELY: *FEVER GREATER THAN 100.4 F (38 C) OR HIGHER *CHILLS OR SWEATING *NAUSEA AND VOMITING THAT IS NOT CONTROLLED WITH YOUR NAUSEA MEDICATION *UNUSUAL SHORTNESS OF BREATH *UNUSUAL BRUISING OR BLEEDING *URINARY PROBLEMS (pain or burning when urinating, or frequent urination) *BOWEL PROBLEMS (unusual diarrhea, constipation, pain near the anus) TENDERNESS IN MOUTH AND THROAT WITH OR WITHOUT PRESENCE OF ULCERS (sore throat, sores in mouth, or a toothache) UNUSUAL RASH, SWELLING OR PAIN  UNUSUAL VAGINAL DISCHARGE OR ITCHING   Items with * indicate a potential emergency and should be followed up as soon as possible or go to the Emergency Department if any problems should occur.  Please show the CHEMOTHERAPY ALERT CARD or IMMUNOTHERAPY ALERT CARD at  check-in to the Emergency Department and triage nurse.  Should you have questions after your visit or need to cancel or reschedule your appointment, please contact [facility]  Dept: [phone]  and follow the prompts.  Office hours are 8:00 a.m. to 4:30 p.m. Monday - Friday. Please note that voicemails left after 4:00 p.m. may not be returned until the following business day.  We are closed weekends and major holidays. You have access to a nurse at all times for urgent questions. Please call the main number to the clinic Dept: [phone] and follow the prompts.   For any non-urgent questions, you may also contact your provider using MyChart. We now offer e-Visits for anyone 59 and older to request care online for non-urgent symptoms. For details visit [email or web address].   Also download the MyChart app! Go to the app store, search "MyChart", open the app, select [facility], and log in with your MyChart username and password.  Due to Covid, a mask is required upon entering the hospital/clinic. If you do not have a mask, one will be given to you upon arrival. For doctor visits, patients may have 1 support person aged 11 or older with them. For treatment visits, patients cannot have anyone with them due to current Covid guidelines and our immunocompromised population.

## 2020-12-06 NOTE — Progress Notes (Signed)
[facility] [phone]   Fax:([phone]  OFFICE PROGRESS NOTE  [name], MD [place] [address]  DIAGNOSIS: Stage IV non-small cell lung cancer, squamous cell carcinoma. She presented with right upper lobe lung mass in addition to pleural-based metastasis and mediastinal lymphadenopathy. She was diagnosed in September 2020.    PRIOR THERAPY: Chemotherapy with carboplatin for an AUC of 5, paclitaxel 175 mg/m, and Keytruda 200 mg IV every 3 weeks with Neulasta support. Last dose 12/08/19. Status post 18 cycles.  Starting from cycle #5 was on maintenance treatment with single agent Keytruda every 3 weeks. This was discontinued due to evidence of disease progression.    CURRENT THERAPY: Palliative systemic chemotherapy with docetaxel 75 mg/m2 and Cyramza 10 mg/kg IV every 3 weeks with Neulasta support. First dose on 01/04/20.  Status post 16 cycles.  Starting from cycle #10 docetaxel was reduced to 65 Mg/M2  INTERVAL HISTORY: Debra Rodgers 75 y.o. female returns to the clinic today for follow-up visit.  The patient is feeling fine today with no concerning complaints except for weakness in the left upper and lower extremity started 2 weeks ago.  She uses a cane for her ambulation.  She denied having any current chest pain, shortness of breath except with exertion with no cough or hemoptysis.  She denied having any fever or chills.  She has no nausea, vomiting, diarrhea or constipation.  She has been tolerating her treatment with systemic chemotherapy fairly well.  The patient is here today for evaluation before starting cycle #17.   MEDICAL HISTORY: Past Medical History:  Diagnosis Date   AAA (abdominal aortic aneurysm) (HCC)    COPD (chronic obstructive pulmonary disease) ([facility])    Depression    Essential hypertension    Headache    History of migraine headaches    lung ca dx'd 10/2018   Sleep apnea    Tobacco use  disorder     ALLERGIES:  is allergic to codeine sulfate and pantoprazole sodium.  MEDICATIONS:  Current Outpatient Medications  Medication Sig Dispense Refill   acetaminophen (TYLENOL) 500 MG tablet Take 500 mg by mouth every 6 (six) hours as needed for moderate pain.     albuterol (VENTOLIN HFA) 108 (90 Base) MCG/ACT inhaler INHALE 2 PUFFS INTO THE LUNGS EVERY 6 (SIX) HOURS AS NEEDED FOR WHEEZING OR SHORTNESS OF BREATH. 4 each 0   amLODipine (NORVASC) 10 MG tablet TAKE 1 TABLET EVERY DAY 90 tablet 3   atorvastatin (LIPITOR) 20 MG tablet TAKE 1 TABLET EVERY DAY 90 tablet 3   benzonatate (TESSALON) 100 MG capsule TAKE 1 CAPSULE EVERY 8 HOURS AS NEEDED FOR COUGH (Patient taking differently: Take 100 mg by mouth 3 (three) times daily as needed for cough.) 90 capsule 0   chlorthalidone (HYGROTON) 50 MG tablet TAKE 1 TABLET EVERY DAY (Patient taking differently: Take 50 mg by mouth daily.) 90 tablet 3   dexamethasone (DECADRON) 4 MG tablet Please take TWO tablets TWICE a day the day before, the day of, and the day after chemotherapy (Patient taking differently: Take 8 mg by mouth See admin instructions. Please take TWO tablets TWICE a day the day before, the day of, and the day after chemotherapy) 120 tablet 2   famotidine (PEPCID) 20 MG tablet Take 20 mg by mouth daily.      FLUoxetine (PROZAC) 20 MG capsule TAKE 1 CAPSULE EVERY DAY (Patient taking differently: Take 20 mg  by mouth daily.) 90 capsule 3   gabapentin (NEURONTIN) 100 MG capsule Take 1 capsule (100 mg total) by mouth 2 (two) times daily. 90 capsule 2   HYDROcodone-acetaminophen (NORCO) 7.5-325 MG tablet Take 1 tablet by mouth every 6 (six) hours as needed for moderate pain. 60 tablet 0   lidocaine-prilocaine (EMLA) cream Apply 1 application topically as needed. 30 g 1   fluticasone (FLONASE) 50 MCG/ACT nasal spray Place 1 spray into both nostrils daily. 11.1 mL 0   magnesium oxide (MAG-OX) 400 MG tablet Take 1 tablet (400 mg total) by  mouth daily. 30 tablet 1   polyvinyl alcohol (LIQUIFILM TEARS) 1.4 % ophthalmic solution Place 1 drop into both eyes as needed for dry eyes.     potassium chloride SA (KLOR-CON) 20 MEQ tablet Take 1 tablet (20 mEq total) by mouth 2 (two) times daily. 14 tablet 0   tiotropium (SPIRIVA HANDIHALER) 18 MCG inhalation capsule Place 1 capsule (18 mcg total) into inhaler and inhale daily. 30 capsule 2   No current facility-administered medications for this visit.    SURGICAL HISTORY:  Past Surgical History:  Procedure Laterality Date   ABDOMINAL HYSTERECTOMY     CHOLECYSTECTOMY     IR IMAGING GUIDED PORT INSERTION  02/24/2019   ROTATOR CUFF REPAIR  3/04   VIDEO BRONCHOSCOPY WITH ENDOBRONCHIAL ULTRASOUND Right 11/25/2018   Procedure: VIDEO BRONCHOSCOPY WITH ENDOBRONCHIAL ULTRASOUND;  Surgeon: Collene Gobble, MD;  Location: [facility] OR;  Service: Thoracic;  Laterality: Right;    REVIEW OF SYSTEMS:  A comprehensive review of systems was negative except for: Constitutional: positive for fatigue Respiratory: positive for dyspnea on exertion Musculoskeletal: positive for muscle weakness   PHYSICAL EXAMINATION: General appearance: alert, cooperative, fatigued, and no distress Head: Normocephalic, without obvious abnormality, atraumatic Neck: no adenopathy, no JVD, supple, symmetrical, trachea midline, and thyroid not enlarged, symmetric, no tenderness/mass/nodules Lymph nodes: Cervical, supraclavicular, and axillary nodes normal. Resp: clear to auscultation bilaterally Back: symmetric, no curvature. ROM normal. No CVA tenderness. Cardio: regular rate and rhythm, S1, S2 normal, no murmur, click, rub or gallop GI: soft, non-tender; bowel sounds normal; no masses,  no organomegaly Extremities: extremities normal, atraumatic, no cyanosis or edema  ECOG PERFORMANCE STATUS: 1 - Symptomatic but completely ambulatory  Blood pressure (!) 154/87, pulse 97, temperature (!) 96.5 F (35.8 C), resp. rate 20, height  5' 1"  (1.549 m), weight 141 lb 11.2 oz (64.3 kg), SpO2 100 %.  LABORATORY DATA: Lab Results  Component Value Date   WBC 8.3 12/06/2020   HGB 10.7 (L) 12/06/2020   HCT 32.1 (L) 12/06/2020   MCV 99.4 12/06/2020   PLT 231 12/06/2020      Chemistry      Component Value Date/Time   NA 139 12/06/2020 1018   NA 139 07/21/2017 0958   K 3.1 (L) 12/06/2020 1018   CL 100 12/06/2020 1018   CO2 30 12/06/2020 1018   BUN 24 (H) 12/06/2020 1018   BUN 26 07/21/2017 0958   CREATININE 0.81 12/06/2020 1018   CREATININE 0.79 09/03/2013 1540      Component Value Date/Time   CALCIUM 9.6 12/06/2020 1018   ALKPHOS 54 12/06/2020 1018   AST 15 12/06/2020 1018   ALT 7 12/06/2020 1018   BILITOT 0.7 12/06/2020 1018       RADIOGRAPHIC STUDIES: VAS [place] AAA DUPLEX  Result Date: 11/07/2020 ABDOMINAL AORTA STUDY Patient Name:  SHEYLI Dalal  Date of Exam:   11/07/2020 Medical Rec #: [id number]  Accession #:    [id number] Date of Birth: Dec 18, 1945      Patient Gender: F Patient Age:   41 years Exam Location:  [facility] Procedure:      VAS [place] AAA DUPLEX Referring Phys: Jamelle Haring --------------------------------------------------------------------------------  Indications: Follow up exam for known AAA. Other Factors: 10/10/20 CTA abdomen: mid aorta 4.7 cm.  Performing Technologist: June Leap RDMS, RVT  Examination Guidelines: A complete evaluation includes B-mode imaging, spectral Doppler, color Doppler, and power Doppler as needed of all accessible portions of each vessel. Bilateral testing is considered an integral part of a complete examination. Limited examinations for reoccurring indications may be performed as noted.  Abdominal Aorta Findings: +-----------+-------+----------+----------+--------+--------+--------+ Location   AP (cm)Trans (cm)PSV (cm/s)WaveformThrombusComments +-----------+-------+----------+----------+--------+--------+--------+ Proximal   2.95   3.51       39                                 +-----------+-------+----------+----------+--------+--------+--------+ Mid        4.85   4.77      48                                 +-----------+-------+----------+----------+--------+--------+--------+ Distal     3.19   3.08      88                                 +-----------+-------+----------+----------+--------+--------+--------+ RT CIA Prox1.5    1.9       43                                 +-----------+-------+----------+----------+--------+--------+--------+ LT CIA Prox1.5    1.5       47                                 +-----------+-------+----------+----------+--------+--------+--------+  Summary: Abdominal Aorta: There is evidence of abnormal dilatation of the mid Abdominal aorta.  *See table(s) above for measurements and observations.  Electronically signed by Jamelle Haring on 11/07/2020 at 9:35:39 AM.    Final     ASSESSMENT AND PLAN: This is a very pleasant 75 years old African-American female with stage IV non-small cell carcinoma,, squamous cell carcinoma diagnosed in September 2020.  She presented with extensive right-sided pleural and thoracic nodal hypermetabolic disease with no extrathoracic disease. The patient started induction treatment with systemic chemotherapy with carboplatin, paclitaxel and Keytruda status post 4 cycles with partial response after cycle #4.  This was followed by 14 cycles of maintenance treatment with single agent Keytruda discontinued secondary to disease progression. The patient is currently undergoing second line systemic chemotherapy with docetaxel 75 mg/M2 and Cyramza 10 mg/KG every 3 weeks with Neulasta support.  Status post 16 cycles.  Starting from cycle #10 her dose of docetaxel was reduced to 65 Mg/M2. The patient continues to tolerate this treatment well with no concerning adverse effects. I recommended for her to proceed with cycle #17 today as planned. I will see her back for  follow-up visit in 3 weeks for evaluation before the next cycle of her treatment with repeat CT scan of the chest, abdomen pelvis for restaging of her disease. For the recent  weakness in the left upper and lower extremities, I will order MRI of the brain to rule out any brain metastasis or other abnormalities. For the hypokalemia, I will give the patient refill of potassium chloride today. She was advised to call immediately if she has any other concerning symptoms in the interval.  The patient voices understanding of current disease status and treatment options and is in agreement with the current care plan. All questions were answered. The patient knows to call the clinic with any problems, questions or concerns. We can certainly see the patient much sooner if necessary. The total time spent in the appointment was 30 minutes.  Disclaimer: This note was dictated with voice recognition software. Similar sounding words can inadvertently be transcribed and may not be corrected upon review.

## 2020-12-07 ENCOUNTER — Telehealth: Payer: Self-pay | Admitting: Internal Medicine

## 2020-12-07 NOTE — Telephone Encounter (Signed)
Scheduled appt per 9/14 los - patient to get an updated schedule next friend

## 2020-12-08 ENCOUNTER — Other Ambulatory Visit: Payer: Self-pay

## 2020-12-08 ENCOUNTER — Inpatient Hospital Stay: Payer: Medicare HMO

## 2020-12-08 VITALS — BP 134/90 | HR 71 | Temp 98.8°F | Resp 16

## 2020-12-08 DIAGNOSIS — J449 Chronic obstructive pulmonary disease, unspecified: Secondary | ICD-10-CM | POA: Diagnosis not present

## 2020-12-08 DIAGNOSIS — Z5111 Encounter for antineoplastic chemotherapy: Secondary | ICD-10-CM | POA: Diagnosis not present

## 2020-12-08 DIAGNOSIS — Z5112 Encounter for antineoplastic immunotherapy: Secondary | ICD-10-CM | POA: Diagnosis not present

## 2020-12-08 DIAGNOSIS — I10 Essential (primary) hypertension: Secondary | ICD-10-CM | POA: Diagnosis not present

## 2020-12-08 DIAGNOSIS — C3411 Malignant neoplasm of upper lobe, right bronchus or lung: Secondary | ICD-10-CM | POA: Diagnosis not present

## 2020-12-08 DIAGNOSIS — R531 Weakness: Secondary | ICD-10-CM | POA: Diagnosis not present

## 2020-12-08 DIAGNOSIS — C3491 Malignant neoplasm of unspecified part of right bronchus or lung: Secondary | ICD-10-CM

## 2020-12-08 DIAGNOSIS — G4730 Sleep apnea, unspecified: Secondary | ICD-10-CM | POA: Diagnosis not present

## 2020-12-08 DIAGNOSIS — I714 Abdominal aortic aneurysm, without rupture: Secondary | ICD-10-CM | POA: Diagnosis not present

## 2020-12-08 DIAGNOSIS — C782 Secondary malignant neoplasm of pleura: Secondary | ICD-10-CM | POA: Diagnosis not present

## 2020-12-08 MED ORDER — PEGFILGRASTIM-JMDB 6 MG/0.6ML ~~LOC~~ SOSY
6.0000 mg | PREFILLED_SYRINGE | Freq: Once | SUBCUTANEOUS | Status: AC
  Administered 2020-12-08: 6 mg via SUBCUTANEOUS
  Filled 2020-12-08: qty 0.6

## 2020-12-08 NOTE — Patient Instructions (Signed)

## 2020-12-15 ENCOUNTER — Ambulatory Visit (HOSPITAL_COMMUNITY)
Admission: RE | Admit: 2020-12-15 | Discharge: 2020-12-15 | Disposition: A | Discharge status: Home / Self Care | Payer: Medicare HMO | Source: Ambulatory Visit | Attending: Internal Medicine | Admitting: Internal Medicine

## 2020-12-15 ENCOUNTER — Other Ambulatory Visit: Payer: Self-pay

## 2020-12-15 DIAGNOSIS — C3490 Malignant neoplasm of unspecified part of unspecified bronchus or lung: Secondary | ICD-10-CM | POA: Insufficient documentation

## 2020-12-15 DIAGNOSIS — R531 Weakness: Secondary | ICD-10-CM | POA: Diagnosis not present

## 2020-12-15 MED ORDER — GADOBUTROL 1 MMOL/ML IV SOLN
6.0000 mL | Freq: Once | INTRAVENOUS | Status: AC | PRN
  Administered 2020-12-15: 6 mL via INTRAVENOUS

## 2020-12-25 ENCOUNTER — Other Ambulatory Visit: Payer: Self-pay

## 2020-12-25 ENCOUNTER — Ambulatory Visit (HOSPITAL_COMMUNITY)
Admission: RE | Admit: 2020-12-25 | Discharge: 2020-12-25 | Disposition: A | Discharge status: Home / Self Care | Payer: Medicare HMO | Source: Ambulatory Visit | Attending: Internal Medicine | Admitting: Internal Medicine

## 2020-12-25 DIAGNOSIS — J9811 Atelectasis: Secondary | ICD-10-CM | POA: Diagnosis not present

## 2020-12-25 DIAGNOSIS — N83292 Other ovarian cyst, left side: Secondary | ICD-10-CM | POA: Diagnosis not present

## 2020-12-25 DIAGNOSIS — K5730 Diverticulosis of large intestine without perforation or abscess without bleeding: Secondary | ICD-10-CM | POA: Diagnosis not present

## 2020-12-25 DIAGNOSIS — C3490 Malignant neoplasm of unspecified part of unspecified bronchus or lung: Secondary | ICD-10-CM | POA: Insufficient documentation

## 2020-12-25 DIAGNOSIS — I712 Thoracic aortic aneurysm, without rupture, unspecified: Secondary | ICD-10-CM | POA: Diagnosis not present

## 2020-12-25 DIAGNOSIS — J90 Pleural effusion, not elsewhere classified: Secondary | ICD-10-CM | POA: Diagnosis not present

## 2020-12-25 DIAGNOSIS — K6389 Other specified diseases of intestine: Secondary | ICD-10-CM | POA: Diagnosis not present

## 2020-12-25 DIAGNOSIS — I714 Abdominal aortic aneurysm, without rupture, unspecified: Secondary | ICD-10-CM | POA: Diagnosis not present

## 2020-12-25 DIAGNOSIS — J439 Emphysema, unspecified: Secondary | ICD-10-CM | POA: Diagnosis not present

## 2020-12-25 DIAGNOSIS — N2889 Other specified disorders of kidney and ureter: Secondary | ICD-10-CM | POA: Diagnosis not present

## 2020-12-25 MED ORDER — HEPARIN SOD (PORK) LOCK FLUSH 100 UNIT/ML IV SOLN: 500.0000 [IU] | Freq: Once | INTRAVENOUS | Status: AC

## 2020-12-25 MED ORDER — IOHEXOL 350 MG/ML SOLN
80.0000 mL | Freq: Once | INTRAVENOUS | Status: AC | PRN
  Administered 2020-12-25: 80 mL via INTRAVENOUS

## 2020-12-25 MED ORDER — IOHEXOL 9 MG/ML PO SOLN: 500.0000 mL | ORAL | Status: AC

## 2020-12-25 MED ORDER — HEPARIN SOD (PORK) LOCK FLUSH 100 UNIT/ML IV SOLN
INTRAVENOUS | Status: AC
  Administered 2020-12-25: 500 [IU] via INTRAVENOUS
  Filled 2020-12-25: qty 5

## 2020-12-25 MED ORDER — IOHEXOL 9 MG/ML PO SOLN
ORAL | Status: AC
  Administered 2020-12-25: 1000 mL via ORAL
  Filled 2020-12-25: qty 1000

## 2020-12-26 MED FILL — Dexamethasone Sodium Phosphate Inj 100 MG/10ML: INTRAMUSCULAR | Qty: 1 | Status: AC

## 2020-12-27 ENCOUNTER — Encounter: Payer: Self-pay | Admitting: Internal Medicine

## 2020-12-27 ENCOUNTER — Other Ambulatory Visit: Payer: Self-pay

## 2020-12-27 ENCOUNTER — Inpatient Hospital Stay: Payer: Medicare HMO

## 2020-12-27 ENCOUNTER — Inpatient Hospital Stay: Payer: Medicare HMO | Attending: Internal Medicine | Admitting: Internal Medicine

## 2020-12-27 VITALS — BP 132/85 | HR 96 | Temp 97.1°F | Resp 20 | Ht 61.0 in | Wt 139.5 lb

## 2020-12-27 DIAGNOSIS — Z79899 Other long term (current) drug therapy: Secondary | ICD-10-CM | POA: Diagnosis not present

## 2020-12-27 DIAGNOSIS — C782 Secondary malignant neoplasm of pleura: Secondary | ICD-10-CM | POA: Diagnosis not present

## 2020-12-27 DIAGNOSIS — I10 Essential (primary) hypertension: Secondary | ICD-10-CM | POA: Insufficient documentation

## 2020-12-27 DIAGNOSIS — I712 Thoracic aortic aneurysm, without rupture, unspecified: Secondary | ICD-10-CM | POA: Diagnosis not present

## 2020-12-27 DIAGNOSIS — J432 Centrilobular emphysema: Secondary | ICD-10-CM | POA: Insufficient documentation

## 2020-12-27 DIAGNOSIS — J449 Chronic obstructive pulmonary disease, unspecified: Secondary | ICD-10-CM | POA: Diagnosis not present

## 2020-12-27 DIAGNOSIS — I714 Abdominal aortic aneurysm, without rupture, unspecified: Secondary | ICD-10-CM | POA: Insufficient documentation

## 2020-12-27 DIAGNOSIS — R5383 Other fatigue: Secondary | ICD-10-CM | POA: Insufficient documentation

## 2020-12-27 DIAGNOSIS — G629 Polyneuropathy, unspecified: Secondary | ICD-10-CM | POA: Diagnosis not present

## 2020-12-27 DIAGNOSIS — F17210 Nicotine dependence, cigarettes, uncomplicated: Secondary | ICD-10-CM | POA: Insufficient documentation

## 2020-12-27 DIAGNOSIS — I2510 Atherosclerotic heart disease of native coronary artery without angina pectoris: Secondary | ICD-10-CM | POA: Insufficient documentation

## 2020-12-27 DIAGNOSIS — E876 Hypokalemia: Secondary | ICD-10-CM | POA: Diagnosis not present

## 2020-12-27 DIAGNOSIS — Z5111 Encounter for antineoplastic chemotherapy: Secondary | ICD-10-CM | POA: Insufficient documentation

## 2020-12-27 DIAGNOSIS — C3411 Malignant neoplasm of upper lobe, right bronchus or lung: Secondary | ICD-10-CM | POA: Insufficient documentation

## 2020-12-27 DIAGNOSIS — Z95828 Presence of other vascular implants and grafts: Secondary | ICD-10-CM

## 2020-12-27 DIAGNOSIS — G4730 Sleep apnea, unspecified: Secondary | ICD-10-CM | POA: Diagnosis not present

## 2020-12-27 DIAGNOSIS — C3491 Malignant neoplasm of unspecified part of right bronchus or lung: Secondary | ICD-10-CM | POA: Diagnosis not present

## 2020-12-27 DIAGNOSIS — R599 Enlarged lymph nodes, unspecified: Secondary | ICD-10-CM | POA: Diagnosis not present

## 2020-12-27 LAB — CMP (CANCER CENTER ONLY)
ALT: 6 U/L (ref 0–44)
AST: 15 U/L (ref 15–41)
Albumin: 3.7 g/dL (ref 3.5–5.0)
Alkaline Phosphatase: 62 U/L (ref 38–126)
Anion gap: 13 (ref 5–15)
BUN: 28 mg/dL — ABNORMAL HIGH (ref 8–23)
CO2: 28 mmol/L (ref 22–32)
Calcium: 10 mg/dL (ref 8.9–10.3)
Chloride: 101 mmol/L (ref 98–111)
Creatinine: 0.9 mg/dL (ref 0.44–1.00)
GFR, Estimated: >60 mL/min (ref >60)
Glucose, Bld: 117 mg/dL — ABNORMAL HIGH (ref 70–99)
Potassium: 3 mmol/L — ABNORMAL LOW (ref 3.5–5.1)
Sodium: 142 mmol/L (ref 135–145)
Total Bilirubin: 0.6 mg/dL (ref 0.3–1.2)
Total Protein: 6.7 g/dL (ref 6.5–8.1)

## 2020-12-27 LAB — CBC WITH DIFFERENTIAL (CANCER CENTER ONLY)
Abs Immature Granulocytes: 0.22 10*3/uL — ABNORMAL HIGH (ref 0.00–0.07)
Basophils Absolute: 0 10*3/uL (ref 0.0–0.1)
Basophils Relative: 0 %
Eosinophils Absolute: 0 10*3/uL (ref 0.0–0.5)
Eosinophils Relative: 0 %
HCT: 31.1 % — ABNORMAL LOW (ref 36.0–46.0)
Hemoglobin: 10.3 g/dL — ABNORMAL LOW (ref 12.0–15.0)
Immature Granulocytes: 2 %
Lymphocytes Relative: 9 %
Lymphs Abs: 1.2 10*3/uL (ref 0.7–4.0)
MCH: 33 pg (ref 26.0–34.0)
MCHC: 33.1 g/dL (ref 30.0–36.0)
MCV: 99.7 fL (ref 80.0–100.0)
Monocytes Absolute: 0.3 10*3/uL (ref 0.1–1.0)
Monocytes Relative: 2 %
Neutro Abs: 11.7 10*3/uL — ABNORMAL HIGH (ref 1.7–7.7)
Neutrophils Relative %: 87 %
Platelet Count: 208 10*3/uL (ref 150–400)
RBC: 3.12 MIL/uL — ABNORMAL LOW (ref 3.87–5.11)
RDW: 17.7 % — ABNORMAL HIGH (ref 11.5–15.5)
WBC Count: 13.4 10*3/uL — ABNORMAL HIGH (ref 4.0–10.5)
nRBC: 0 % (ref 0.0–0.2)

## 2020-12-27 LAB — TSH: TSH: 1.74 u[IU]/mL (ref 0.308–3.960)

## 2020-12-27 MED ORDER — DIPHENHYDRAMINE HCL 50 MG/ML IJ SOLN
50.0000 mg | Freq: Once | INTRAMUSCULAR | Status: AC
  Administered 2020-12-27: 50 mg via INTRAVENOUS
  Filled 2020-12-27: qty 1

## 2020-12-27 MED ORDER — SODIUM CHLORIDE 0.9% FLUSH: 10.0000 mL | INTRAVENOUS | Status: DC | PRN

## 2020-12-27 MED ORDER — SODIUM CHLORIDE 0.9 % IV SOLN
10.0000 mg/kg | Freq: Once | INTRAVENOUS | Status: AC
  Administered 2020-12-27: 600 mg via INTRAVENOUS
  Filled 2020-12-27: qty 50

## 2020-12-27 MED ORDER — ACETAMINOPHEN 325 MG PO TABS
ORAL_TABLET | ORAL | Status: AC
  Administered 2020-12-27: 650 mg via ORAL
  Filled 2020-12-27: qty 2

## 2020-12-27 MED ORDER — SODIUM CHLORIDE 0.9 % IV SOLN
65.0000 mg/m2 | Freq: Once | INTRAVENOUS | Status: AC
  Administered 2020-12-27: 110 mg via INTRAVENOUS
  Filled 2020-12-27: qty 11

## 2020-12-27 MED ORDER — SODIUM CHLORIDE 0.9 % IV SOLN: Freq: Once | INTRAVENOUS | Status: AC

## 2020-12-27 MED ORDER — POTASSIUM CHLORIDE CRYS ER 20 MEQ PO TBCR: 20.0000 meq | EXTENDED_RELEASE_TABLET | Freq: Two times a day (BID) | ORAL | 0 refills | Status: DC

## 2020-12-27 MED ORDER — SODIUM CHLORIDE 0.9 % IV SOLN
10.0000 mg | Freq: Once | INTRAVENOUS | Status: AC
  Administered 2020-12-27: 10 mg via INTRAVENOUS
  Filled 2020-12-27: qty 10

## 2020-12-27 MED ORDER — ACETAMINOPHEN 325 MG PO TABS: 650.0000 mg | ORAL_TABLET | Freq: Once | ORAL | Status: AC

## 2020-12-27 MED ORDER — HEPARIN SOD (PORK) LOCK FLUSH 100 UNIT/ML IV SOLN
500.0000 [IU] | Freq: Once | INTRAVENOUS | Status: AC | PRN
  Administered 2020-12-27: 500 [IU]

## 2020-12-27 MED ORDER — SODIUM CHLORIDE 0.9% FLUSH
10.0000 mL | INTRAVENOUS | Status: DC | PRN
  Administered 2020-12-27: 10 mL

## 2020-12-27 NOTE — Progress Notes (Signed)
[place] [phone]   [phone]  OFFICE PROGRESS NOTE  [name], MD [place] [address]  DIAGNOSIS: Stage IV non-small cell lung cancer, squamous cell carcinoma. She presented with right upper lobe lung mass in addition to pleural-based metastasis and mediastinal lymphadenopathy. She was diagnosed in September 2020.    PRIOR THERAPY: Chemotherapy with carboplatin for an AUC of 5, paclitaxel 175 mg/m, and Keytruda 200 mg IV every 3 weeks with Neulasta support. Last dose 12/08/19. Status post 18 cycles.  Starting from cycle #5 was on maintenance treatment with single agent Keytruda every 3 weeks. This was discontinued due to evidence of disease progression.    CURRENT THERAPY: Palliative systemic chemotherapy with docetaxel 75 mg/m2 and Cyramza 10 mg/kg IV every 3 weeks with Neulasta support. First dose on 01/04/20.  Status post 17 cycles.  Starting from cycle #10 docetaxel was reduced to 65 Mg/M2  INTERVAL HISTORY: Debra Rodgers 75 y.o. female returns to the clinic today for follow-up visit.  The patient is feeling fine today with no concerning complaints except for mild fatigue and peripheral neuropathy.  She also has darkening of her skin and nail changes.  She denied having any current chest pain, shortness of breath, cough or hemoptysis.  She denied having any fever or chills.  She has no nausea, vomiting, diarrhea or constipation.  She has no headache or visual changes.  She denied having any recent weight loss or night sweats.  The patient had repeat CT scan of the chest, abdomen pelvis as well as MRI of the brain performed recently and she is here for evaluation and discussion of her scan results.    MEDICAL HISTORY: Past Medical History:  Diagnosis Date   AAA (abdominal aortic aneurysm)    COPD (chronic obstructive pulmonary disease) ([place])    Depression    Essential hypertension    Headache     History of migraine headaches    lung ca dx'd 10/2018   Sleep apnea    Tobacco use disorder     ALLERGIES:  is allergic to codeine sulfate and pantoprazole sodium.  MEDICATIONS:  Current Outpatient Medications  Medication Sig Dispense Refill   acetaminophen (TYLENOL) 500 MG tablet Take 500 mg by mouth every 6 (six) hours as needed for moderate pain.     amLODipine (NORVASC) 10 MG tablet TAKE 1 TABLET EVERY DAY 90 tablet 3   atorvastatin (LIPITOR) 20 MG tablet TAKE 1 TABLET EVERY DAY 90 tablet 3   chlorthalidone (HYGROTON) 50 MG tablet TAKE 1 TABLET EVERY DAY (Patient taking differently: Take 50 mg by mouth daily.) 90 tablet 3   dexamethasone (DECADRON) 4 MG tablet Please take TWO tablets TWICE a day the day before, the day of, and the day after chemotherapy (Patient taking differently: Take 8 mg by mouth See admin instructions. Please take TWO tablets TWICE a day the day before, the day of, and the day after chemotherapy) 120 tablet 2   famotidine (PEPCID) 20 MG tablet Take 20 mg by mouth daily.      FLUoxetine (PROZAC) 20 MG capsule TAKE 1 CAPSULE EVERY DAY (Patient taking differently: Take 20 mg by mouth daily.) 90 capsule 3   gabapentin (NEURONTIN) 100 MG capsule Take 1 capsule (100 mg total) by mouth 2 (two) times daily. 90 capsule 2   lidocaine-prilocaine (EMLA) cream Apply 1 application topically as needed. 30 g 1   polyvinyl alcohol (LIQUIFILM TEARS)  1.4 % ophthalmic solution Place 1 drop into both eyes as needed for dry eyes.     albuterol (VENTOLIN HFA) 108 (90 Base) MCG/ACT inhaler INHALE 2 PUFFS INTO THE LUNGS EVERY 6 (SIX) HOURS AS NEEDED FOR WHEEZING OR SHORTNESS OF BREATH. (Patient not taking: Reported on 12/27/2020) 4 each 0   benzonatate (TESSALON) 100 MG capsule TAKE 1 CAPSULE EVERY 8 HOURS AS NEEDED FOR COUGH (Patient not taking: Reported on 12/27/2020) 90 capsule 0   fluticasone (FLONASE) 50 MCG/ACT nasal spray Place 1 spray into both nostrils daily. 11.1 mL 0    HYDROcodone-acetaminophen (NORCO) 7.5-325 MG tablet Take 1 tablet by mouth every 6 (six) hours as needed for moderate pain. (Patient not taking: Reported on 12/27/2020) 60 tablet 0   tiotropium (SPIRIVA HANDIHALER) 18 MCG inhalation capsule Place 1 capsule (18 mcg total) into inhaler and inhale daily. 30 capsule 2   No current facility-administered medications for this visit.    SURGICAL HISTORY:  Past Surgical History:  Procedure Laterality Date   ABDOMINAL HYSTERECTOMY     CHOLECYSTECTOMY     IR IMAGING GUIDED PORT INSERTION  02/24/2019   ROTATOR CUFF REPAIR  3/04   VIDEO BRONCHOSCOPY WITH ENDOBRONCHIAL ULTRASOUND Right 11/25/2018   Procedure: VIDEO BRONCHOSCOPY WITH ENDOBRONCHIAL ULTRASOUND;  Surgeon: Collene Gobble, MD;  Location: [facility] OR;  Service: Thoracic;  Laterality: Right;    REVIEW OF SYSTEMS:  Constitutional: positive for fatigue Eyes: negative Ears, nose, mouth, throat, and face: negative Respiratory: negative Cardiovascular: negative Gastrointestinal: negative Genitourinary:negative Integument/breast: negative Hematologic/lymphatic: negative Musculoskeletal:negative Neurological: positive for paresthesia Behavioral/Psych: negative Endocrine: negative Allergic/Immunologic: negative   PHYSICAL EXAMINATION: General appearance: alert, cooperative, fatigued, and no distress Head: Normocephalic, without obvious abnormality, atraumatic Neck: no adenopathy, no JVD, supple, symmetrical, trachea midline, and thyroid not enlarged, symmetric, no tenderness/mass/nodules Lymph nodes: Cervical, supraclavicular, and axillary nodes normal. Resp: clear to auscultation bilaterally Back: symmetric, no curvature. ROM normal. No CVA tenderness. Cardio: regular rate and rhythm, S1, S2 normal, no murmur, click, rub or gallop GI: soft, non-tender; bowel sounds normal; no masses,  no organomegaly Extremities: extremities normal, atraumatic, no cyanosis or edema Neurologic: Alert and oriented  X 3, normal strength and tone. Normal symmetric reflexes. Normal coordination and gait  ECOG PERFORMANCE STATUS: 1 - Symptomatic but completely ambulatory  Blood pressure 132/85, pulse 96, temperature (!) 97.1 F (36.2 C), temperature source Tympanic, resp. rate 20, height 5' 1"  (1.549 m), weight 139 lb 8 oz (63.3 kg), SpO2 99 %.  LABORATORY DATA: Lab Results  Component Value Date   WBC 13.4 (H) 12/27/2020   HGB 10.3 (L) 12/27/2020   HCT 31.1 (L) 12/27/2020   MCV 99.7 12/27/2020   PLT 208 12/27/2020      Chemistry      Component Value Date/Time   NA 142 12/27/2020 0833   NA 139 07/21/2017 0958   K 3.0 (L) 12/27/2020 0833   CL 101 12/27/2020 0833   CO2 28 12/27/2020 0833   BUN 28 (H) 12/27/2020 0833   BUN 26 07/21/2017 0958   CREATININE 0.90 12/27/2020 0833   CREATININE 0.79 09/03/2013 1540      Component Value Date/Time   CALCIUM 10.0 12/27/2020 0833   ALKPHOS 62 12/27/2020 0833   AST 15 12/27/2020 0833   ALT 6 12/27/2020 0833   BILITOT 0.6 12/27/2020 0833       RADIOGRAPHIC STUDIES: CT Chest W Contrast  Result Date: 12/25/2020 CLINICAL DATA:  Non-small cell lung cancer restaging EXAM: CT CHEST, ABDOMEN, AND  PELVIS WITH CONTRAST TECHNIQUE: Multidetector CT imaging of the chest, abdomen and pelvis was performed following the standard protocol during bolus administration of intravenous contrast. CONTRAST:  62m OMNIPAQUE IOHEXOL 350 MG/ML SOLN COMPARISON:  10/10/2020 FINDINGS: CT CHEST FINDINGS Cardiovascular: Right Port-A-Cath tip: Cavoatrial junction. Coronary, aortic arch, and branch vessel atherosclerotic vascular disease. Lower thoracic aortic aneurysm just above the hiatus: 3.9 cm in diameter, previously measured at 4.0 cm in diameter. Mediastinum/Nodes: Left axillary node 0.8 cm in short axis on image 11 series 2, previously 0.6 cm. No pathologic mediastinal adenopathy is observed. Lungs/Pleura: Stable small left pleural effusion. Substantial centrilobular emphysema.  Mild left upper lobe atelectasis along the major fissure. There is also some mild atelectasis medially in the left lower lobe. Musculoskeletal: Endplate sclerosis in the upper thoracic spine with thoracic spondylosis and mild scoliosis. CT ABDOMEN PELVIS FINDINGS Hepatobiliary: Hypodense lesion favoring cyst in the right hepatic lobe measuring 1.1 by 0.8 cm on image 53 series 2. Stable 0.9 by 0.7 cm lesion in segment 4b of the liver on image 65 series 2, likewise most likely benign. Mild prominence of the extrahepatic biliary tree is probably a physiologic response to prior cholecystectomy. Pancreas: Unremarkable Spleen: Unremarkable Adrenals/Urinary Tract: Stable renal atrophy with cortical thinning, left greater than right. Adrenal glands unremarkable. No hydronephrosis. Urinary bladder unremarkable. Stomach/Bowel: Sigmoid colon diverticulosis. As noted on the prior exam there is some prominence of left upper quadrant small bowel loops, equivocal for a small internal hernia but without complicating feature. Vascular/Lymphatic: Fusiform supraumbilical abdominal aortic aneurysm measuring 4.5 by 4.1 cm on image 57 series 2 (previously same) with associated mural thrombus. Atherosclerosis is present, including aortoiliac atherosclerotic disease. Reproductive: Left adnexal cystic lesion 7.3 by 6.7 cm, stable, simple appearing. Recommend follow-up [facility] 6-12 months, or simply surveillance on follow up examinations. Note: This recommendation does not apply to premenarchal patients and to those with increased risk (genetic, family history, elevated tumor markers or other high-risk factors) of ovarian cancer. Reference: JACR 2020 Feb; 17(2):248-254. Uterus absent. Other: Moderate diffuse subcutaneous edema. 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 Grade 1 degenerative anterolisthesis at L3-4 and  L4-5 with multilevel spondylosis and degenerative disc disease contributing to impingement at the L4-5 and L5-S1 levels. IMPRESSION: 1. No findings of active malignancy. 2. Stable size of the thoracic and upper abdominal components of the aortic aneurysms. Continued surveillance suggested. 3. Stable size of the left adnexal cystic lesion, surveillance suggested as noted above. 4. Other imaging findings of potential clinical significance: Aortic Atherosclerosis (ICD10-I70.0). Coronary atherosclerosis. Stable small left pleural effusion. Emphysema (ICD10-J43.9). Mild scarring/atelectasis medially in the left lower lobe and along the major fissure on the left. Osteitis pubis. Small liver lesions are likely benign. Sigmoid colon diverticulosis. Nonspecific subcutaneous edema diffusely. Impingement at L4-5 and L5-S1. Electronically Signed   By: WVan ClinesM.D.   On: 12/25/2020 19:38   MR BRAIN W WO CONTRAST  Result Date: 12/18/2020 CLINICAL DATA:  Hx of non-small cell lung cancer, staging. Weakness in the left upper and lower extremities EXAM: MRI HEAD WITHOUT AND WITH CONTRAST TECHNIQUE: Multiplanar, multiecho pulse sequences of the brain and surrounding structures were obtained without and with intravenous contrast. CONTRAST:  654mGADAVIST GADOBUTROL 1 MMOL/ML IV SOLN COMPARISON:  MR Head 12/27/2019. FINDINGS: Mildly motion limited study.  Within this limitation: Brain: No acute infarction, hemorrhage, or hydrocephalus. 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 Similar scattered T2 hyperintensities in the white matter, nonspecific but compatible with chronic microvascular ischemic disease. No abnormal enhancement. Vascular: Major arterial flow voids are maintained at the skull base. Skull and upper cervical spine: Normal marrow signal. Partially imaged ACDF. Sinuses/Orbits: Moderate paranasal sinus mucosal thickening. No acute orbital findings. Other: No  mastoid effusions. IMPRESSION: 1. No evidence of acute intracranial abnormality or metastatic disease. 2. Moderate paranasal sinus mucosal thickening. Electronically Signed   By: Margaretha Sheffield M.D.   On: 12/15/2020 09:39   CT Abdomen Pelvis W Contrast  Result Date: 12/25/2020 CLINICAL DATA:  Non-small cell lung cancer restaging EXAM: CT CHEST, ABDOMEN, AND PELVIS WITH CONTRAST TECHNIQUE: Multidetector CT imaging of the chest, abdomen and pelvis was performed following the standard protocol during bolus administration of intravenous contrast. CONTRAST:  18m OMNIPAQUE IOHEXOL 350 MG/ML SOLN COMPARISON:  10/10/2020 FINDINGS: CT CHEST FINDINGS Cardiovascular: Right Port-A-Cath tip: Cavoatrial junction. Coronary, aortic arch, and branch vessel atherosclerotic vascular disease. Lower thoracic aortic aneurysm just above the hiatus: 3.9 cm in diameter, previously measured at 4.0 cm in diameter. Mediastinum/Nodes: Left axillary node 0.8 cm in short axis on image 11 series 2, previously 0.6 cm. No pathologic mediastinal adenopathy is observed. Lungs/Pleura: Stable small left pleural effusion. Substantial centrilobular emphysema. Mild left upper lobe atelectasis along the major fissure. There is also some mild atelectasis medially in the left lower lobe. Musculoskeletal: Endplate sclerosis in the upper thoracic spine with thoracic spondylosis and mild scoliosis. CT ABDOMEN PELVIS FINDINGS Hepatobiliary: Hypodense lesion favoring cyst in the right hepatic lobe measuring 1.1 by 0.8 cm on image 53 series 2. Stable 0.9 by 0.7 cm lesion in segment 4b of the liver on image 65 series 2, likewise most likely benign. Mild prominence of the extrahepatic biliary tree is probably a physiologic response to prior cholecystectomy. Pancreas: Unremarkable Spleen: Unremarkable Adrenals/Urinary Tract: Stable renal atrophy with cortical thinning, left greater than right. Adrenal glands unremarkable. No hydronephrosis. Urinary bladder  unremarkable. Stomach/Bowel: Sigmoid colon diverticulosis. As noted on the prior exam there is some prominence of left upper quadrant small bowel loops, equivocal for a small internal hernia but without complicating feature. Vascular/Lymphatic: Fusiform supraumbilical abdominal aortic aneurysm measuring 4.5 by 4.1 cm on image 57 series 2 (previously same) with associated mural thrombus. Atherosclerosis is present, including aortoiliac atherosclerotic disease. Reproductive: Left adnexal cystic lesion 7.3 by 6.7 cm, stable, simple appearing. Recommend follow-up [facility] 6-12 months, or simply surveillance on follow up examinations. Note: This recommendation does not apply to premenarchal patients and to those with increased risk (genetic, family history, elevated tumor markers or other high-risk factors) of ovarian cancer. Reference: JACR 2020 Feb; 17(2):248-254. Uterus absent. Other: Moderate diffuse subcutaneous edema. 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 Grade 1 degenerative anterolisthesis at L3-4 and L4-5 with multilevel spondylosis and degenerative disc disease contributing to impingement at the L4-5 and L5-S1 levels. IMPRESSION: 1. No findings of active malignancy. 2. Stable size of the thoracic and upper abdominal components of the aortic aneurysms. Continued surveillance suggested. 3. Stable size of the left adnexal cystic lesion, surveillance suggested as noted above. 4. Other imaging findings of potential clinical significance: Aortic Atherosclerosis (ICD10-I70.0). Coronary atherosclerosis. Stable small left pleural effusion. Emphysema (ICD10-J43.9). Mild scarring/atelectasis medially in the left lower lobe and along the major fissure on the left. Osteitis pubis. Small liver lesions are likely benign. Sigmoid colon diverticulosis. Nonspecific  subcutaneous edema diffusely.  Impingement at L4-5 and L5-S1. Electronically Signed   By: Van Clines M.D.   On: 12/25/2020 19:38    ASSESSMENT AND PLAN: This is a very pleasant 75 years old African-American female with stage IV non-small cell carcinoma,, squamous cell carcinoma diagnosed in September 2020.  She presented with extensive right-sided pleural and thoracic nodal hypermetabolic disease with no extrathoracic disease. The patient started induction treatment with systemic chemotherapy with carboplatin, paclitaxel and Keytruda status post 4 cycles with partial response after cycle #4.  This was followed by 14 cycles of maintenance treatment with single agent Keytruda discontinued secondary to disease progression. The patient is currently undergoing second line systemic chemotherapy with docetaxel 75 mg/M2 and Cyramza 10 mg/KG every 3 weeks with Neulasta support.  Status post 17 cycles.  Starting from cycle #10 her dose of docetaxel was reduced to 65 Mg/M2. The patient has been tolerating this treatment well with no concerning adverse effect except for mild peripheral neuropathy and nail changes. She had repeat CT scan of the chest, abdomen pelvis as well as MRI of the brain.  I personally and independently reviewed the imaging studies and discussed the results with the patient today. Her scan and MRI showed no concerning findings for disease progression. I recommended for the patient to continue her current treatment with reduced dose docetaxel and Cyramza. I will see her back for follow-up visit in 3 weeks for evaluation before the next cycle of her treatment. For the hypokalemia, I will send a prescription of potassium chloride 20 mEq p.o. daily to her pharmacy. She will come back for follow-up visit in 3 weeks for evaluation before the next cycle of her treatment. The patient was advised to call immediately if she has any other concerning symptoms in the interval.  The patient voices understanding of current  disease status and treatment options and is in agreement with the current care plan. All questions were answered. The patient knows to call the clinic with any problems, questions or concerns. We can certainly see the patient much sooner if necessary. The total time spent in the appointment was 35 minutes.  Disclaimer: This note was dictated with voice recognition software. Similar sounding words can inadvertently be transcribed and may not be corrected upon review.

## 2020-12-27 NOTE — Patient Instructions (Signed)
[facility]  Discharge Instructions: Thank you for choosing [facility] to provide your oncology and hematology care.   If you have a lab appointment with the [facility], please go directly to the [facility] and check in at the registration area.   Wear comfortable clothing and clothing appropriate for easy access to any Portacath or PICC line.   We strive to give you quality time with your provider. You may need to reschedule your appointment if you arrive late (15 or more minutes).  Arriving late affects you and other patients whose appointments are after yours.  Also, if you miss three or more appointments without notifying the office, you may be dismissed from the clinic at the provider's discretion.      For prescription refill requests, have your pharmacy contact our office and allow 72 hours for refills to be completed.    Today you received the following chemotherapy and/or immunotherapy agents Taxotere and Cyramza      To help prevent nausea and vomiting after your treatment, we encourage you to take your nausea medication as directed.  BELOW ARE SYMPTOMS THAT SHOULD BE REPORTED IMMEDIATELY: *FEVER GREATER THAN 100.4 F (38 C) OR HIGHER *CHILLS OR SWEATING *NAUSEA AND VOMITING THAT IS NOT CONTROLLED WITH YOUR NAUSEA MEDICATION *UNUSUAL SHORTNESS OF BREATH *UNUSUAL BRUISING OR BLEEDING *URINARY PROBLEMS (pain or burning when urinating, or frequent urination) *BOWEL PROBLEMS (unusual diarrhea, constipation, pain near the anus) TENDERNESS IN MOUTH AND THROAT WITH OR WITHOUT PRESENCE OF ULCERS (sore throat, sores in mouth, or a toothache) UNUSUAL RASH, SWELLING OR PAIN  UNUSUAL VAGINAL DISCHARGE OR ITCHING   Items with * indicate a potential emergency and should be followed up as soon as possible or go to the Emergency Department if any problems should occur.  Please show the CHEMOTHERAPY ALERT CARD or IMMUNOTHERAPY ALERT CARD at  check-in to the Emergency Department and triage nurse.  Should you have questions after your visit or need to cancel or reschedule your appointment, please contact [facility]  Dept: [phone]  and follow the prompts.  Office hours are 8:00 a.m. to 4:30 p.m. Monday - Friday. Please note that voicemails left after 4:00 p.m. may not be returned until the following business day.  We are closed weekends and major holidays. You have access to a nurse at all times for urgent questions. Please call the main number to the clinic Dept: [phone] and follow the prompts.   For any non-urgent questions, you may also contact your provider using MyChart. We now offer e-Visits for anyone 78 and older to request care online for non-urgent symptoms. For details visit [email or web address].   Also download the MyChart app! Go to the app store, search "MyChart", open the app, select [facility], and log in with your MyChart username and password.  Due to Covid, a mask is required upon entering the hospital/clinic. If you do not have a mask, one will be given to you upon arrival. For doctor visits, patients may have 1 support person aged 62 or older with them. For treatment visits, patients cannot have anyone with them due to current Covid guidelines and our immunocompromised population.

## 2020-12-29 ENCOUNTER — Other Ambulatory Visit: Payer: Self-pay

## 2020-12-29 ENCOUNTER — Inpatient Hospital Stay: Payer: Medicare HMO

## 2020-12-29 VITALS — BP 117/84 | HR 83 | Temp 98.4°F | Resp 16

## 2020-12-29 DIAGNOSIS — Z5111 Encounter for antineoplastic chemotherapy: Secondary | ICD-10-CM | POA: Diagnosis not present

## 2020-12-29 DIAGNOSIS — C3491 Malignant neoplasm of unspecified part of right bronchus or lung: Secondary | ICD-10-CM

## 2020-12-29 DIAGNOSIS — R5383 Other fatigue: Secondary | ICD-10-CM | POA: Diagnosis not present

## 2020-12-29 DIAGNOSIS — I10 Essential (primary) hypertension: Secondary | ICD-10-CM | POA: Diagnosis not present

## 2020-12-29 DIAGNOSIS — I714 Abdominal aortic aneurysm, without rupture, unspecified: Secondary | ICD-10-CM | POA: Diagnosis not present

## 2020-12-29 DIAGNOSIS — G629 Polyneuropathy, unspecified: Secondary | ICD-10-CM | POA: Diagnosis not present

## 2020-12-29 DIAGNOSIS — R599 Enlarged lymph nodes, unspecified: Secondary | ICD-10-CM | POA: Diagnosis not present

## 2020-12-29 DIAGNOSIS — E876 Hypokalemia: Secondary | ICD-10-CM | POA: Diagnosis not present

## 2020-12-29 DIAGNOSIS — C782 Secondary malignant neoplasm of pleura: Secondary | ICD-10-CM | POA: Diagnosis not present

## 2020-12-29 DIAGNOSIS — C3411 Malignant neoplasm of upper lobe, right bronchus or lung: Secondary | ICD-10-CM | POA: Diagnosis not present

## 2020-12-29 MED ORDER — PEGFILGRASTIM-JMDB 6 MG/0.6ML ~~LOC~~ SOSY
6.0000 mg | PREFILLED_SYRINGE | Freq: Once | SUBCUTANEOUS | Status: AC
  Administered 2020-12-29: 6 mg via SUBCUTANEOUS
  Filled 2020-12-29: qty 0.6

## 2020-12-29 NOTE — Patient Instructions (Signed)

## 2021-01-16 ENCOUNTER — Other Ambulatory Visit: Payer: Self-pay

## 2021-01-16 DIAGNOSIS — C3491 Malignant neoplasm of unspecified part of right bronchus or lung: Secondary | ICD-10-CM

## 2021-01-16 MED FILL — Dexamethasone Sodium Phosphate Inj 100 MG/10ML: INTRAMUSCULAR | Qty: 1 | Status: AC

## 2021-01-17 ENCOUNTER — Inpatient Hospital Stay: Payer: Medicare HMO

## 2021-01-17 ENCOUNTER — Inpatient Hospital Stay (HOSPITAL_BASED_OUTPATIENT_CLINIC_OR_DEPARTMENT_OTHER): Payer: Medicare HMO | Admitting: Internal Medicine

## 2021-01-17 ENCOUNTER — Other Ambulatory Visit: Payer: Self-pay

## 2021-01-17 VITALS — BP 130/90 | HR 93 | Temp 98.0°F | Resp 17 | Ht 61.0 in | Wt 135.3 lb

## 2021-01-17 DIAGNOSIS — I714 Abdominal aortic aneurysm, without rupture, unspecified: Secondary | ICD-10-CM | POA: Diagnosis not present

## 2021-01-17 DIAGNOSIS — C3491 Malignant neoplasm of unspecified part of right bronchus or lung: Secondary | ICD-10-CM

## 2021-01-17 DIAGNOSIS — Z5111 Encounter for antineoplastic chemotherapy: Secondary | ICD-10-CM | POA: Diagnosis not present

## 2021-01-17 DIAGNOSIS — G629 Polyneuropathy, unspecified: Secondary | ICD-10-CM | POA: Diagnosis not present

## 2021-01-17 DIAGNOSIS — E876 Hypokalemia: Secondary | ICD-10-CM | POA: Diagnosis not present

## 2021-01-17 DIAGNOSIS — Z95828 Presence of other vascular implants and grafts: Secondary | ICD-10-CM

## 2021-01-17 DIAGNOSIS — C3411 Malignant neoplasm of upper lobe, right bronchus or lung: Secondary | ICD-10-CM | POA: Diagnosis not present

## 2021-01-17 DIAGNOSIS — C782 Secondary malignant neoplasm of pleura: Secondary | ICD-10-CM | POA: Diagnosis not present

## 2021-01-17 DIAGNOSIS — I10 Essential (primary) hypertension: Secondary | ICD-10-CM | POA: Diagnosis not present

## 2021-01-17 DIAGNOSIS — R599 Enlarged lymph nodes, unspecified: Secondary | ICD-10-CM | POA: Diagnosis not present

## 2021-01-17 DIAGNOSIS — R5383 Other fatigue: Secondary | ICD-10-CM | POA: Diagnosis not present

## 2021-01-17 LAB — CBC WITH DIFFERENTIAL (CANCER CENTER ONLY)
Abs Immature Granulocytes: 0.1 10*3/uL — ABNORMAL HIGH (ref 0.00–0.07)
Basophils Absolute: 0 10*3/uL (ref 0.0–0.1)
Basophils Relative: 0 %
Eosinophils Absolute: 0 10*3/uL (ref 0.0–0.5)
Eosinophils Relative: 0 %
HCT: 32.8 % — ABNORMAL LOW (ref 36.0–46.0)
Hemoglobin: 10.8 g/dL — ABNORMAL LOW (ref 12.0–15.0)
Immature Granulocytes: 1 %
Lymphocytes Relative: 11 %
Lymphs Abs: 1 10*3/uL (ref 0.7–4.0)
MCH: 33.1 pg (ref 26.0–34.0)
MCHC: 32.9 g/dL (ref 30.0–36.0)
MCV: 100.6 fL — ABNORMAL HIGH (ref 80.0–100.0)
Monocytes Absolute: 0.3 10*3/uL (ref 0.1–1.0)
Monocytes Relative: 3 %
Neutro Abs: 8.4 10*3/uL — ABNORMAL HIGH (ref 1.7–7.7)
Neutrophils Relative %: 85 %
Platelet Count: 208 10*3/uL (ref 150–400)
RBC: 3.26 MIL/uL — ABNORMAL LOW (ref 3.87–5.11)
RDW: 18.2 % — ABNORMAL HIGH (ref 11.5–15.5)
WBC Count: 9.8 10*3/uL (ref 4.0–10.5)
nRBC: 0 % (ref 0.0–0.2)

## 2021-01-17 LAB — CMP (CANCER CENTER ONLY)
ALT: 6 U/L (ref 0–44)
AST: 12 U/L — ABNORMAL LOW (ref 15–41)
Albumin: 3.6 g/dL (ref 3.5–5.0)
Alkaline Phosphatase: 59 U/L (ref 38–126)
Anion gap: 12 (ref 5–15)
BUN: 24 mg/dL — ABNORMAL HIGH (ref 8–23)
CO2: 27 mmol/L (ref 22–32)
Calcium: 9.4 mg/dL (ref 8.9–10.3)
Chloride: 100 mmol/L (ref 98–111)
Creatinine: 0.83 mg/dL (ref 0.44–1.00)
GFR, Estimated: >60 mL/min (ref >60)
Glucose, Bld: 124 mg/dL — ABNORMAL HIGH (ref 70–99)
Potassium: 3.1 mmol/L — ABNORMAL LOW (ref 3.5–5.1)
Sodium: 139 mmol/L (ref 135–145)
Total Bilirubin: 0.7 mg/dL (ref 0.3–1.2)
Total Protein: 6.5 g/dL (ref 6.5–8.1)

## 2021-01-17 MED ORDER — ACETAMINOPHEN 325 MG PO TABS
650.0000 mg | ORAL_TABLET | Freq: Once | ORAL | Status: AC
  Administered 2021-01-17: 650 mg via ORAL
  Filled 2021-01-17: qty 2

## 2021-01-17 MED ORDER — POTASSIUM CHLORIDE CRYS ER 20 MEQ PO TBCR: 20.0000 meq | EXTENDED_RELEASE_TABLET | Freq: Two times a day (BID) | ORAL | 0 refills | Status: DC

## 2021-01-17 MED ORDER — SODIUM CHLORIDE 0.9 % IV SOLN
65.0000 mg/m2 | Freq: Once | INTRAVENOUS | Status: AC
  Administered 2021-01-17: 110 mg via INTRAVENOUS
  Filled 2021-01-17: qty 11

## 2021-01-17 MED ORDER — SODIUM CHLORIDE 0.9 % IV SOLN
10.0000 mg/kg | Freq: Once | INTRAVENOUS | Status: AC
  Administered 2021-01-17: 600 mg via INTRAVENOUS
  Filled 2021-01-17: qty 10

## 2021-01-17 MED ORDER — DIPHENHYDRAMINE HCL 50 MG/ML IJ SOLN
50.0000 mg | Freq: Once | INTRAMUSCULAR | Status: AC
  Administered 2021-01-17: 50 mg via INTRAVENOUS
  Filled 2021-01-17: qty 1

## 2021-01-17 MED ORDER — SODIUM CHLORIDE 0.9 % IV SOLN
10.0000 mg | Freq: Once | INTRAVENOUS | Status: AC
  Administered 2021-01-17: 10 mg via INTRAVENOUS
  Filled 2021-01-17: qty 10

## 2021-01-17 MED ORDER — SODIUM CHLORIDE 0.9 % IV SOLN: Freq: Once | INTRAVENOUS | Status: AC

## 2021-01-17 MED ORDER — SODIUM CHLORIDE 0.9% FLUSH
10.0000 mL | INTRAVENOUS | Status: DC | PRN
  Administered 2021-01-17: 10 mL

## 2021-01-17 NOTE — Patient Instructions (Signed)
[facility]  Discharge Instructions: Thank you for choosing [facility] to provide your oncology and hematology care.   If you have a lab appointment with the [facility], please go directly to the [facility] and check in at the registration area.   Wear comfortable clothing and clothing appropriate for easy access to any Portacath or PICC line.   We strive to give you quality time with your provider. You may need to reschedule your appointment if you arrive late (15 or more minutes).  Arriving late affects you and other patients whose appointments are after yours.  Also, if you miss three or more appointments without notifying the office, you may be dismissed from the clinic at the provider's discretion.      For prescription refill requests, have your pharmacy contact our office and allow 72 hours for refills to be completed.    Today you received the following chemotherapy and/or immunotherapy agents Taxotere and Cyramza      To help prevent nausea and vomiting after your treatment, we encourage you to take your nausea medication as directed.  BELOW ARE SYMPTOMS THAT SHOULD BE REPORTED IMMEDIATELY: *FEVER GREATER THAN 100.4 F (38 C) OR HIGHER *CHILLS OR SWEATING *NAUSEA AND VOMITING THAT IS NOT CONTROLLED WITH YOUR NAUSEA MEDICATION *UNUSUAL SHORTNESS OF BREATH *UNUSUAL BRUISING OR BLEEDING *URINARY PROBLEMS (pain or burning when urinating, or frequent urination) *BOWEL PROBLEMS (unusual diarrhea, constipation, pain near the anus) TENDERNESS IN MOUTH AND THROAT WITH OR WITHOUT PRESENCE OF ULCERS (sore throat, sores in mouth, or a toothache) UNUSUAL RASH, SWELLING OR PAIN  UNUSUAL VAGINAL DISCHARGE OR ITCHING   Items with * indicate a potential emergency and should be followed up as soon as possible or go to the Emergency Department if any problems should occur.  Please show the CHEMOTHERAPY ALERT CARD or IMMUNOTHERAPY ALERT CARD at  check-in to the Emergency Department and triage nurse.  Should you have questions after your visit or need to cancel or reschedule your appointment, please contact [facility]  Dept: [phone]  and follow the prompts.  Office hours are 8:00 a.m. to 4:30 p.m. Monday - Friday. Please note that voicemails left after 4:00 p.m. may not be returned until the following business day.  We are closed weekends and major holidays. You have access to a nurse at all times for urgent questions. Please call the main number to the clinic Dept: [phone] and follow the prompts.   For any non-urgent questions, you may also contact your provider using MyChart. We now offer e-Visits for anyone 74 and older to request care online for non-urgent symptoms. For details visit [email or web address].   Also download the MyChart app! Go to the app store, search "MyChart", open the app, select [facility], and log in with your MyChart username and password.  Due to Covid, a mask is required upon entering the hospital/clinic. If you do not have a mask, one will be given to you upon arrival. For doctor visits, patients may have 1 support person aged 74 or older with them. For treatment visits, patients cannot have anyone with them due to current Covid guidelines and our immunocompromised population.

## 2021-01-17 NOTE — Progress Notes (Signed)
[facility] [phone]   [phone]  OFFICE PROGRESS NOTE  [name], MD [place] [address]  DIAGNOSIS: Stage IV non-small cell lung cancer, squamous cell carcinoma. She presented with right upper lobe lung mass in addition to pleural-based metastasis and mediastinal lymphadenopathy. She was diagnosed in September 2020.    PRIOR THERAPY: Chemotherapy with carboplatin for an AUC of 5, paclitaxel 175 mg/m, and Keytruda 200 mg IV every 3 weeks with Neulasta support. Last dose 12/08/19. Status post 18 cycles.  Starting from cycle #5 was on maintenance treatment with single agent Keytruda every 3 weeks. This was discontinued due to evidence of disease progression.    CURRENT THERAPY: Palliative systemic chemotherapy with docetaxel 75 mg/m2 and Cyramza 10 mg/kg IV every 3 weeks with Neulasta support. First dose on 01/04/20.  Status post 18 cycles.  Starting from cycle #10 docetaxel was reduced to 65 Mg/M2  INTERVAL HISTORY: Debra Rodgers 75 y.o. female returns to the clinic today for follow-up visit.  The patient is feeling fine today with no concerning complaints except for the generalized fatigue.  She also continues to have the nail changes and darkening of her skin.  She has no chest pain has shortness of breath with exertion with no cough or hemoptysis.  She denied having any nausea, vomiting, diarrhea or constipation.  She has no headache or visual changes.  She has no fever or chills.  The patient lost around 5 pounds since her last visit.    MEDICAL HISTORY: Past Medical History:  Diagnosis Date   AAA (abdominal aortic aneurysm)    COPD (chronic obstructive pulmonary disease) ([place])    Depression    Essential hypertension    Headache    History of migraine headaches    lung ca dx'd 10/2018   Sleep apnea    Tobacco use disorder     ALLERGIES:  is allergic to codeine sulfate and pantoprazole  sodium.  MEDICATIONS:  Current Outpatient Medications  Medication Sig Dispense Refill   acetaminophen (TYLENOL) 500 MG tablet Take 500 mg by mouth every 6 (six) hours as needed for moderate pain.     albuterol (VENTOLIN HFA) 108 (90 Base) MCG/ACT inhaler INHALE 2 PUFFS INTO THE LUNGS EVERY 6 (SIX) HOURS AS NEEDED FOR WHEEZING OR SHORTNESS OF BREATH. (Patient not taking: Reported on 12/27/2020) 4 each 0   amLODipine (NORVASC) 10 MG tablet TAKE 1 TABLET EVERY DAY 90 tablet 3   atorvastatin (LIPITOR) 20 MG tablet TAKE 1 TABLET EVERY DAY 90 tablet 3   benzonatate (TESSALON) 100 MG capsule TAKE 1 CAPSULE EVERY 8 HOURS AS NEEDED FOR COUGH (Patient not taking: Reported on 12/27/2020) 90 capsule 0   chlorthalidone (HYGROTON) 50 MG tablet TAKE 1 TABLET EVERY DAY (Patient taking differently: Take 50 mg by mouth daily.) 90 tablet 3   dexamethasone (DECADRON) 4 MG tablet Please take TWO tablets TWICE a day the day before, the day of, and the day after chemotherapy (Patient taking differently: Take 8 mg by mouth See admin instructions. Please take TWO tablets TWICE a day the day before, the day of, and the day after chemotherapy) 120 tablet 2   famotidine (PEPCID) 20 MG tablet Take 20 mg by mouth daily.      FLUoxetine (PROZAC) 20 MG capsule TAKE 1 CAPSULE EVERY DAY (Patient taking differently: Take 20 mg by mouth daily.) 90 capsule 3   fluticasone (FLONASE) 50 MCG/ACT nasal spray  Place 1 spray into both nostrils daily. 11.1 mL 0   gabapentin (NEURONTIN) 100 MG capsule Take 1 capsule (100 mg total) by mouth 2 (two) times daily. 90 capsule 2   HYDROcodone-acetaminophen (NORCO) 7.5-325 MG tablet Take 1 tablet by mouth every 6 (six) hours as needed for moderate pain. (Patient not taking: Reported on 12/27/2020) 60 tablet 0   lidocaine-prilocaine (EMLA) cream Apply 1 application topically as needed. 30 g 1   polyvinyl alcohol (LIQUIFILM TEARS) 1.4 % ophthalmic solution Place 1 drop into both eyes as needed for dry  eyes.     potassium chloride SA (KLOR-CON) 20 MEQ tablet Take 1 tablet (20 mEq total) by mouth 2 (two) times daily. 10 tablet 0   tiotropium (SPIRIVA HANDIHALER) 18 MCG inhalation capsule Place 1 capsule (18 mcg total) into inhaler and inhale daily. 30 capsule 2   No current facility-administered medications for this visit.    SURGICAL HISTORY:  Past Surgical History:  Procedure Laterality Date   ABDOMINAL HYSTERECTOMY     CHOLECYSTECTOMY     IR IMAGING GUIDED PORT INSERTION  02/24/2019   ROTATOR CUFF REPAIR  3/04   VIDEO BRONCHOSCOPY WITH ENDOBRONCHIAL ULTRASOUND Right 11/25/2018   Procedure: VIDEO BRONCHOSCOPY WITH ENDOBRONCHIAL ULTRASOUND;  Surgeon: Collene Gobble, MD;  Location: [facility] OR;  Service: Thoracic;  Laterality: Right;    REVIEW OF SYSTEMS:  A comprehensive review of systems was negative except for: Constitutional: positive for fatigue Respiratory: positive for dyspnea on exertion Musculoskeletal: positive for arthralgias   PHYSICAL EXAMINATION: General appearance: alert, cooperative, fatigued, and no distress Head: Normocephalic, without obvious abnormality, atraumatic Neck: no adenopathy, no JVD, supple, symmetrical, trachea midline, and thyroid not enlarged, symmetric, no tenderness/mass/nodules Lymph nodes: Cervical, supraclavicular, and axillary nodes normal. Resp: clear to auscultation bilaterally Back: symmetric, no curvature. ROM normal. No CVA tenderness. Cardio: regular rate and rhythm, S1, S2 normal, no murmur, click, rub or gallop GI: soft, non-tender; bowel sounds normal; no masses,  no organomegaly Extremities: extremities normal, atraumatic, no cyanosis or edema  ECOG PERFORMANCE STATUS: 1 - Symptomatic but completely ambulatory  Blood pressure 130/90, pulse 93, temperature 98 F (36.7 C), temperature source Oral, resp. rate 17, height 5' 1"  (1.549 m), weight 135 lb 4.8 oz (61.4 kg), SpO2 99 %.  LABORATORY DATA: Lab Results  Component Value Date   WBC  9.8 01/17/2021   HGB 10.8 (L) 01/17/2021   HCT 32.8 (L) 01/17/2021   MCV 100.6 (H) 01/17/2021   PLT 208 01/17/2021      Chemistry      Component Value Date/Time   NA 139 01/17/2021 0923   NA 139 07/21/2017 0958   K 3.1 (L) 01/17/2021 0923   CL 100 01/17/2021 0923   CO2 27 01/17/2021 0923   BUN 24 (H) 01/17/2021 0923   BUN 26 07/21/2017 0958   CREATININE 0.83 01/17/2021 0923   CREATININE 0.79 09/03/2013 1540      Component Value Date/Time   CALCIUM 9.4 01/17/2021 0923   ALKPHOS 59 01/17/2021 0923   AST 12 (L) 01/17/2021 0923   ALT 6 01/17/2021 0923   BILITOT 0.7 01/17/2021 0923       RADIOGRAPHIC STUDIES: CT Chest W Contrast  Result Date: 12/25/2020 CLINICAL DATA:  Non-small cell lung cancer restaging EXAM: CT CHEST, ABDOMEN, AND PELVIS WITH CONTRAST TECHNIQUE: Multidetector CT imaging of the chest, abdomen and pelvis was performed following the standard protocol during bolus administration of intravenous contrast. CONTRAST:  65m OMNIPAQUE IOHEXOL 350 MG/ML SOLN COMPARISON:  10/10/2020 FINDINGS: CT CHEST FINDINGS Cardiovascular: Right Port-A-Cath tip: Cavoatrial junction. Coronary, aortic arch, and branch vessel atherosclerotic vascular disease. Lower thoracic aortic aneurysm just above the hiatus: 3.9 cm in diameter, previously measured at 4.0 cm in diameter. Mediastinum/Nodes: Left axillary node 0.8 cm in short axis on image 11 series 2, previously 0.6 cm. No pathologic mediastinal adenopathy is observed. Lungs/Pleura: Stable small left pleural effusion. Substantial centrilobular emphysema. Mild left upper lobe atelectasis along the major fissure. There is also some mild atelectasis medially in the left lower lobe. Musculoskeletal: Endplate sclerosis in the upper thoracic spine with thoracic spondylosis and mild scoliosis. CT ABDOMEN PELVIS FINDINGS Hepatobiliary: Hypodense lesion favoring cyst in the right hepatic lobe measuring 1.1 by 0.8 cm on image 53 series 2. Stable 0.9 by  0.7 cm lesion in segment 4b of the liver on image 65 series 2, likewise most likely benign. Mild prominence of the extrahepatic biliary tree is probably a physiologic response to prior cholecystectomy. Pancreas: Unremarkable Spleen: Unremarkable Adrenals/Urinary Tract: Stable renal atrophy with cortical thinning, left greater than right. Adrenal glands unremarkable. No hydronephrosis. Urinary bladder unremarkable. Stomach/Bowel: Sigmoid colon diverticulosis. As noted on the prior exam there is some prominence of left upper quadrant small bowel loops, equivocal for a small internal hernia but without complicating feature. Vascular/Lymphatic: Fusiform supraumbilical abdominal aortic aneurysm measuring 4.5 by 4.1 cm on image 57 series 2 (previously same) with associated mural thrombus. Atherosclerosis is present, including aortoiliac atherosclerotic disease. Reproductive: Left adnexal cystic lesion 7.3 by 6.7 cm, stable, simple appearing. Recommend follow-up US in 6-12 months, or simply surveillance on follow up examinations. Note: This recommendation does not apply to premenarchal patients and to those with increased risk (genetic, family history, elevated tumor markers or other high-risk factors) of ovarian cancer. Reference: JACR 2020 Feb; 17(2):248-254. Uterus absent. Other: Moderate diffuse subcutaneous edema. 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 Grade 1 degenerative anterolisthesis at L3-4 and L4-5 with multilevel spondylosis and degenerative disc disease contributing to impingement at the L4-5 and L5-S1 levels. IMPRESSION: 1. No findings of active malignancy. 2. Stable size of the thoracic and upper abdominal components of the aortic aneurysms. Continued surveillance suggested. 3. Stable size of the left adnexal cystic lesion, surveillance suggested as noted above. 4. Other imaging  findings of potential clinical significance: Aortic Atherosclerosis (ICD10-I70.0). Coronary atherosclerosis. Stable small left pleural effusion. Emphysema (ICD10-J43.9). Mild scarring/atelectasis medially in the left lower lobe and along the major fissure on the left. Osteitis pubis. Small liver lesions are likely benign. Sigmoid colon diverticulosis. Nonspecific subcutaneous edema diffusely. Impingement at L4-5 and L5-S1. Electronically Signed   By: Van Clines M.D.   On: 12/25/2020 19:38   CT Abdomen Pelvis W Contrast  Result Date: 12/25/2020 CLINICAL DATA:  Non-small cell lung cancer restaging EXAM: CT CHEST, ABDOMEN, AND PELVIS WITH CONTRAST TECHNIQUE: Multidetector CT imaging of the chest, abdomen and pelvis was performed following the standard protocol during bolus administration of intravenous contrast. CONTRAST:  54m OMNIPAQUE IOHEXOL 350 MG/ML SOLN COMPARISON:  10/10/2020 FINDINGS: CT CHEST FINDINGS Cardiovascular: Right Port-A-Cath tip: Cavoatrial junction. Coronary, aortic arch, and branch vessel atherosclerotic vascular disease. Lower thoracic aortic aneurysm just above the hiatus: 3.9 cm in diameter, previously measured at 4.0 cm in diameter. Mediastinum/Nodes: Left axillary node 0.8 cm in short axis on image 11 series 2, previously 0.6 cm. No pathologic mediastinal adenopathy is observed. Lungs/Pleura: Stable small left pleural effusion. Substantial  centrilobular emphysema. Mild left upper lobe atelectasis along the major fissure. There is also some mild atelectasis medially in the left lower lobe. Musculoskeletal: Endplate sclerosis in the upper thoracic spine with thoracic spondylosis and mild scoliosis. CT ABDOMEN PELVIS FINDINGS Hepatobiliary: Hypodense lesion favoring cyst in the right hepatic lobe measuring 1.1 by 0.8 cm on image 53 series 2. Stable 0.9 by 0.7 cm lesion in segment 4b of the liver on image 65 series 2, likewise most likely benign. Mild prominence of the extrahepatic  biliary tree is probably a physiologic response to prior cholecystectomy. Pancreas: Unremarkable Spleen: Unremarkable Adrenals/Urinary Tract: Stable renal atrophy with cortical thinning, left greater than right. Adrenal glands unremarkable. No hydronephrosis. Urinary bladder unremarkable. Stomach/Bowel: Sigmoid colon diverticulosis. As noted on the prior exam there is some prominence of left upper quadrant small bowel loops, equivocal for a small internal hernia but without complicating feature. Vascular/Lymphatic: Fusiform supraumbilical abdominal aortic aneurysm measuring 4.5 by 4.1 cm on image 57 series 2 (previously same) with associated mural thrombus. Atherosclerosis is present, including aortoiliac atherosclerotic disease. Reproductive: Left adnexal cystic lesion 7.3 by 6.7 cm, stable, simple appearing. Recommend follow-up US in 6-12 months, or simply surveillance on follow up examinations. Note: This recommendation does not apply to premenarchal patients and to those with increased risk (genetic, family history, elevated tumor markers or other high-risk factors) of ovarian cancer. Reference: JACR 2020 Feb; 17(2):248-254. Uterus absent. Other: Moderate diffuse subcutaneous edema. 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 Grade 1 degenerative anterolisthesis at L3-4 and L4-5 with multilevel spondylosis and degenerative disc disease contributing to impingement at the L4-5 and L5-S1 levels. IMPRESSION: 1. No findings of active malignancy. 2. Stable size of the thoracic and upper abdominal components of the aortic aneurysms. Continued surveillance suggested. 3. Stable size of the left adnexal cystic lesion, surveillance suggested as noted above. 4. Other imaging findings of potential clinical significance: Aortic Atherosclerosis (ICD10-I70.0). Coronary atherosclerosis. Stable small left  pleural effusion. Emphysema (ICD10-J43.9). Mild scarring/atelectasis medially in the left lower lobe and along the major fissure on the left. Osteitis pubis. Small liver lesions are likely benign. Sigmoid colon diverticulosis. Nonspecific subcutaneous edema diffusely. Impingement at L4-5 and L5-S1. Electronically Signed   By: Van Clines M.D.   On: 12/25/2020 19:38    ASSESSMENT AND PLAN: This is a very pleasant 75 years old African-American female with stage IV non-small cell carcinoma,, squamous cell carcinoma diagnosed in September 2020.  She presented with extensive right-sided pleural and thoracic nodal hypermetabolic disease with no extrathoracic disease. The patient started induction treatment with systemic chemotherapy with carboplatin, paclitaxel and Keytruda status post 4 cycles with partial response after cycle #4.  This was followed by 14 cycles of maintenance treatment with single agent Keytruda discontinued secondary to disease progression. The patient is currently undergoing second line systemic chemotherapy with docetaxel 75 mg/M2 and Cyramza 10 mg/KG every 3 weeks with Neulasta support.  Status post 18 cycles.  Starting from cycle #10 her dose of docetaxel was reduced to 65 Mg/M2. The patient has been tolerating her treatment fairly well with no concerning adverse effect except for the baseline fatigue. I recommended for her to proceed with cycle #19 today as planned. For the hypokalemia, I will refill her potassium chloride supplements 20 mEq p.o. daily for 10 days. I will see her back for follow-up visit in 3 weeks for evaluation before the next cycle of her treatment.  She was advised to call immediately if she has any concerning symptoms in the interval. The patient voices understanding of current disease status and treatment options and is in agreement with the current care plan. All questions were answered. The patient knows to call the clinic with any problems, questions or  concerns. We can certainly see the patient much sooner if necessary. The total time spent in the appointment was 35 minutes.  Disclaimer: This note was dictated with voice recognition software. Similar sounding words can inadvertently be transcribed and may not be corrected upon review.

## 2021-01-19 ENCOUNTER — Telehealth: Payer: Self-pay | Admitting: Internal Medicine

## 2021-01-19 ENCOUNTER — Other Ambulatory Visit: Payer: Self-pay

## 2021-01-19 ENCOUNTER — Inpatient Hospital Stay: Payer: Medicare HMO

## 2021-01-19 VITALS — BP 125/83 | HR 80 | Temp 98.4°F | Resp 18

## 2021-01-19 DIAGNOSIS — E876 Hypokalemia: Secondary | ICD-10-CM | POA: Diagnosis not present

## 2021-01-19 DIAGNOSIS — C3411 Malignant neoplasm of upper lobe, right bronchus or lung: Secondary | ICD-10-CM | POA: Diagnosis not present

## 2021-01-19 DIAGNOSIS — C3491 Malignant neoplasm of unspecified part of right bronchus or lung: Secondary | ICD-10-CM

## 2021-01-19 DIAGNOSIS — G629 Polyneuropathy, unspecified: Secondary | ICD-10-CM | POA: Diagnosis not present

## 2021-01-19 DIAGNOSIS — I10 Essential (primary) hypertension: Secondary | ICD-10-CM | POA: Diagnosis not present

## 2021-01-19 DIAGNOSIS — R5383 Other fatigue: Secondary | ICD-10-CM | POA: Diagnosis not present

## 2021-01-19 DIAGNOSIS — Z5111 Encounter for antineoplastic chemotherapy: Secondary | ICD-10-CM | POA: Diagnosis not present

## 2021-01-19 DIAGNOSIS — I714 Abdominal aortic aneurysm, without rupture, unspecified: Secondary | ICD-10-CM | POA: Diagnosis not present

## 2021-01-19 DIAGNOSIS — C782 Secondary malignant neoplasm of pleura: Secondary | ICD-10-CM | POA: Diagnosis not present

## 2021-01-19 DIAGNOSIS — R599 Enlarged lymph nodes, unspecified: Secondary | ICD-10-CM | POA: Diagnosis not present

## 2021-01-19 MED ORDER — PEGFILGRASTIM-JMDB 6 MG/0.6ML ~~LOC~~ SOSY
6.0000 mg | PREFILLED_SYRINGE | Freq: Once | SUBCUTANEOUS | Status: AC
  Administered 2021-01-19: 6 mg via SUBCUTANEOUS
  Filled 2021-01-19: qty 0.6

## 2021-01-19 NOTE — Patient Instructions (Signed)

## 2021-01-19 NOTE — Telephone Encounter (Signed)
Scheduled follow-up appointments per 10/26 los. Patient is aware.

## 2021-01-27 IMAGING — CT CT ABDOMEN AND PELVIS WITH CONTRAST
2 of 7 series · 15 of 46 positions shown, 17 images · IV contrast (APPLIED)
Comparison: CT of the abdomen pelvis dated 02/18/2017 and left rib
radiograph dated 11/05/2018

CLINICAL DATA: 72-year-old female with abdominal distention and
epigastric and flank pain. Patient fell 2 weeks ago.

EXAM:
CT ABDOMEN AND PELVIS WITH CONTRAST
TECHNIQUE: Multidetector CT imaging of the abdomen and pelvis was performed
using the standard protocol following bolus administration of
intravenous contrast.
CONTRAST:  100mL OMNIPAQUE IOHEXOL 300 MG/ML  SOLN

[Series 3: abdomen 5.0 · axial · 0.82mm/px · z∈[+262,+596]mm · 12 of 79 slices shown, 14 images]
[im 6/79  soft-tissue]
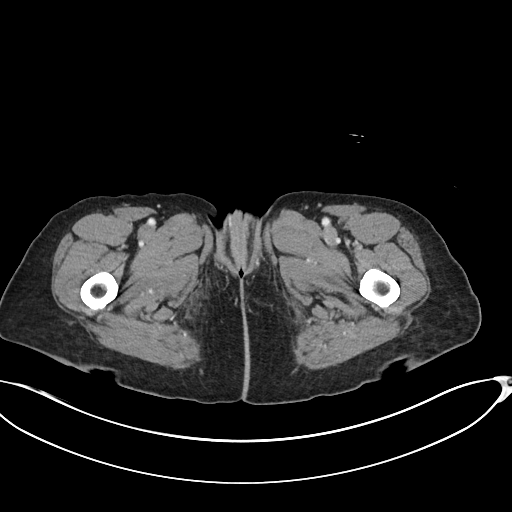
[im 6/79  bone]
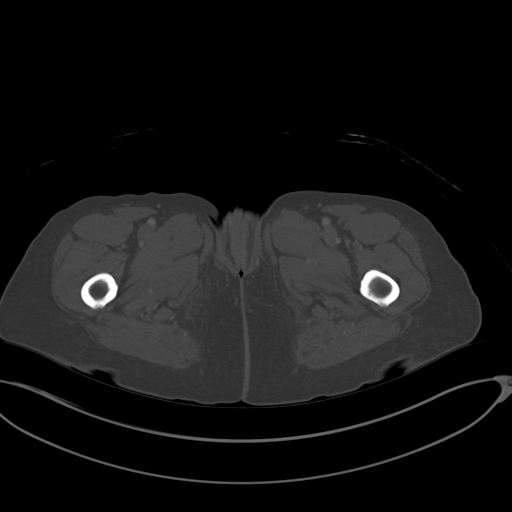
[im 12/79  soft-tissue]
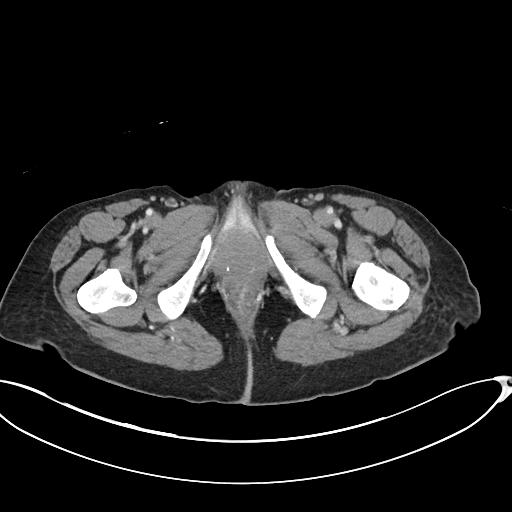
[im 17/79  soft-tissue]
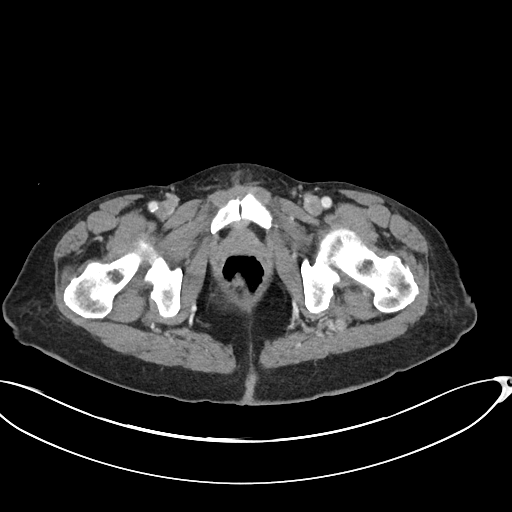
[im 23/79  soft-tissue]
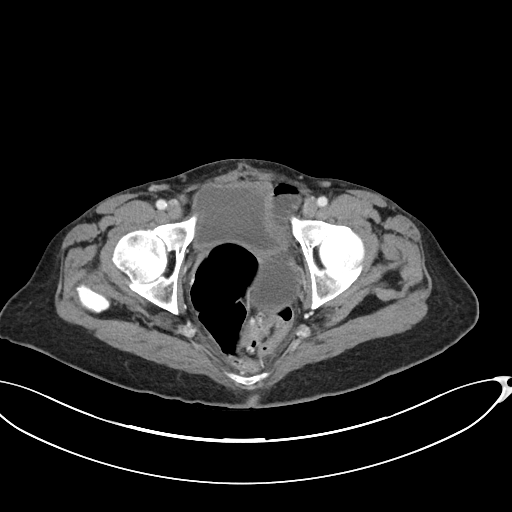
[im 28/79  soft-tissue]
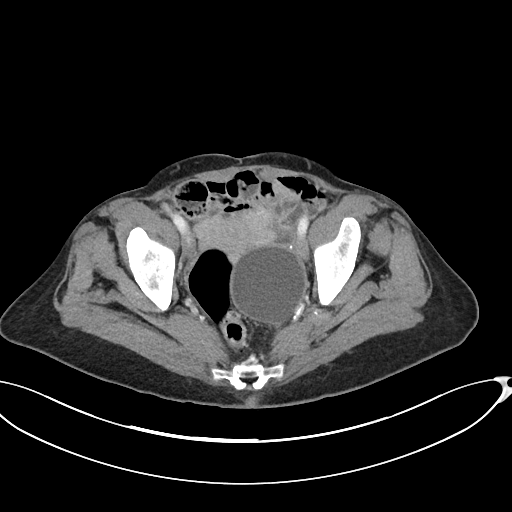
[im 34/79  soft-tissue]
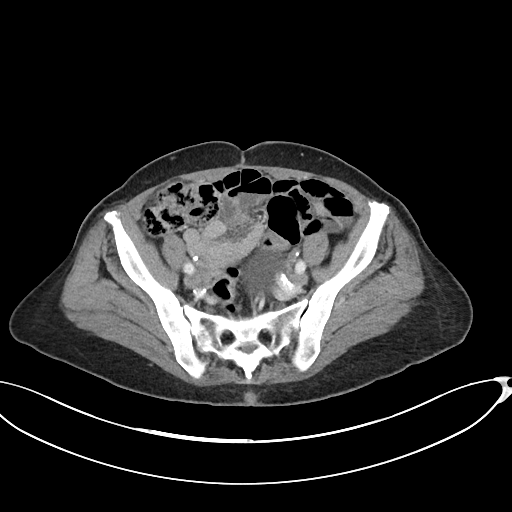
[im 45/79  soft-tissue]
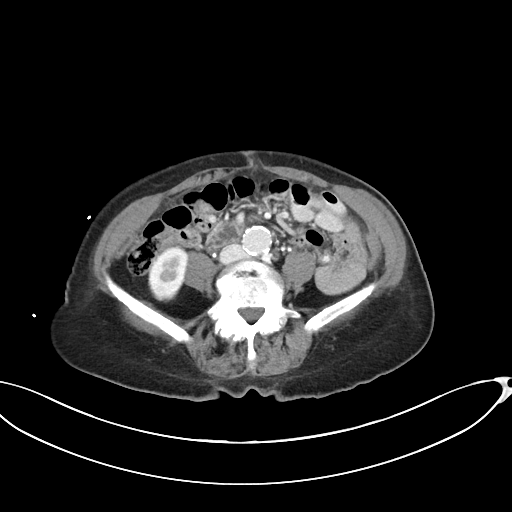
[im 51/79  soft-tissue]
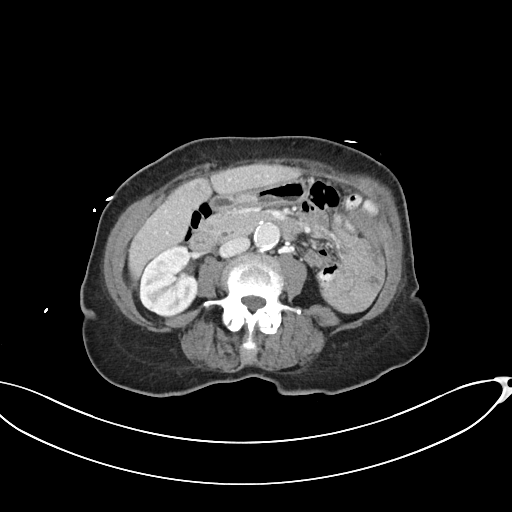
[im 56/79  soft-tissue]
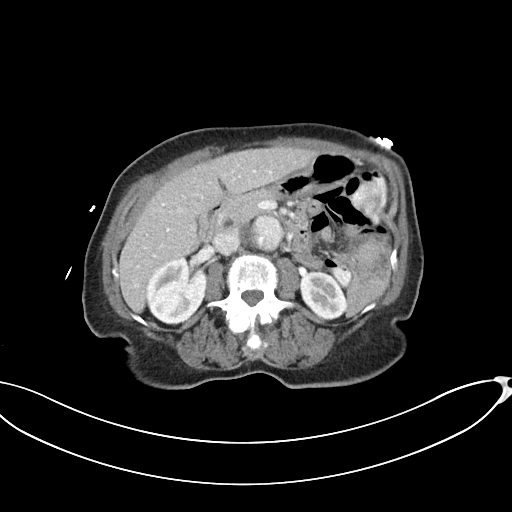
[im 56/79  bone]
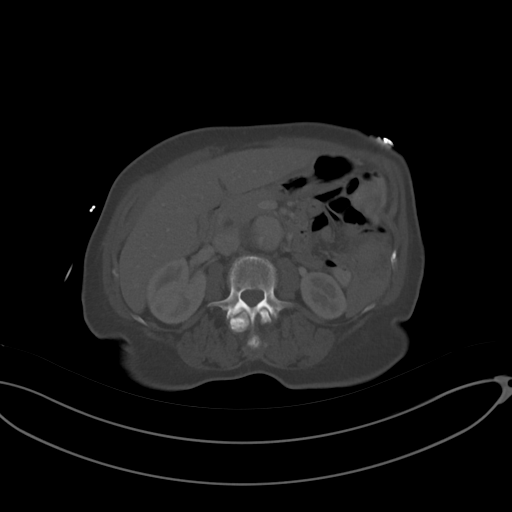
[im 62/79  soft-tissue]
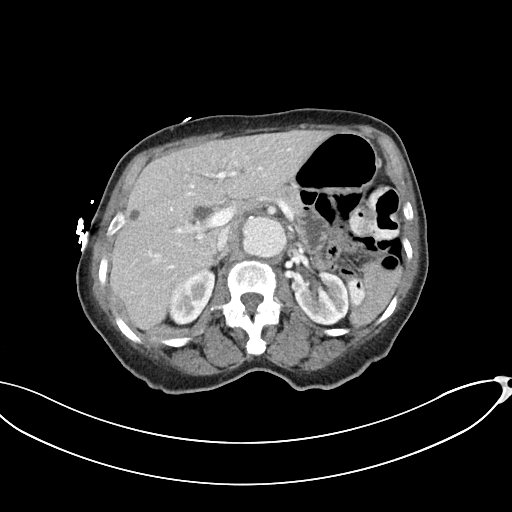
[im 67/79  soft-tissue]
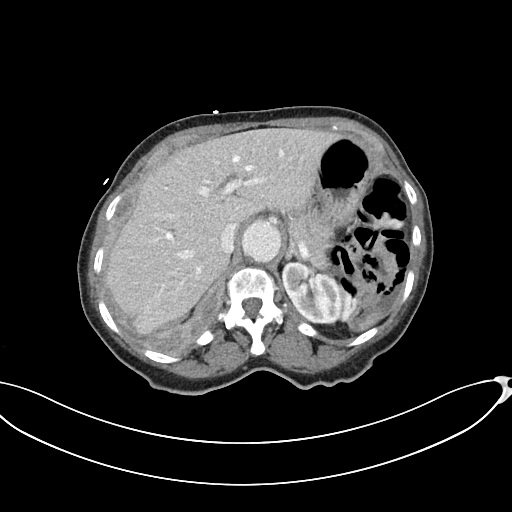
[im 73/79  soft-tissue]
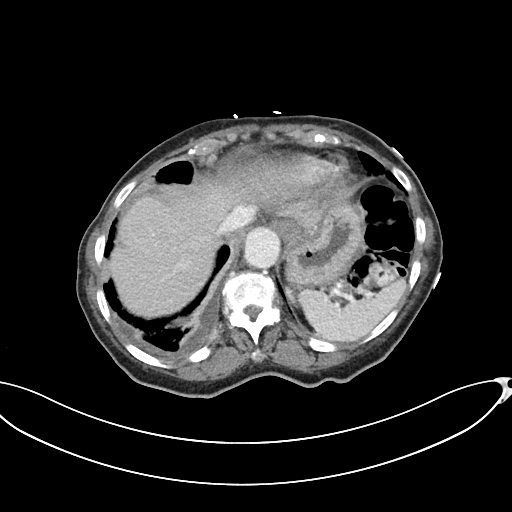

[Series 6: abdomen 3.0 mpr cor · coronal · 0.73mm/px · 3 of 86 slices shown]
[im 29/86  soft-tissue]
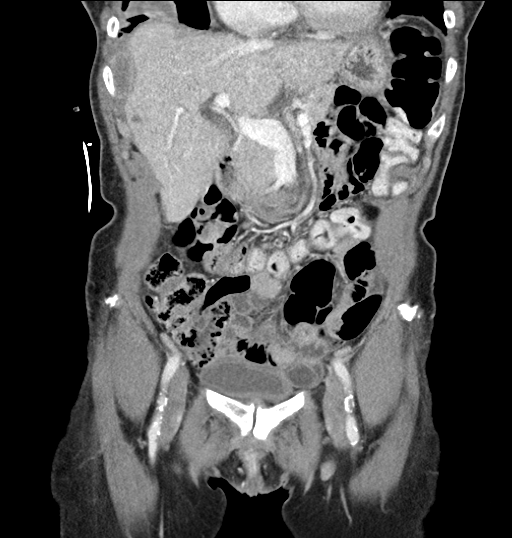
[im 38/86  soft-tissue]
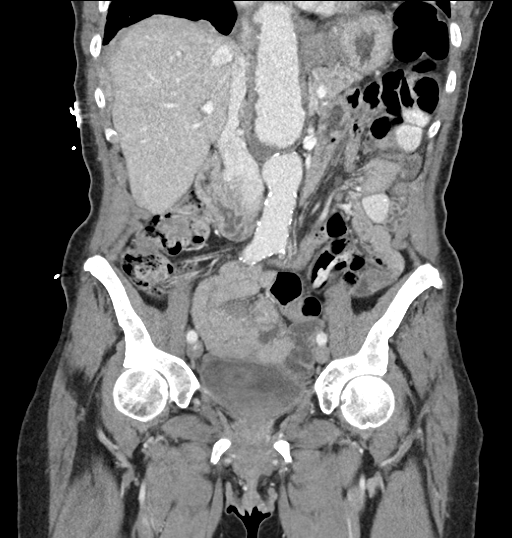
[im 48/86  soft-tissue]
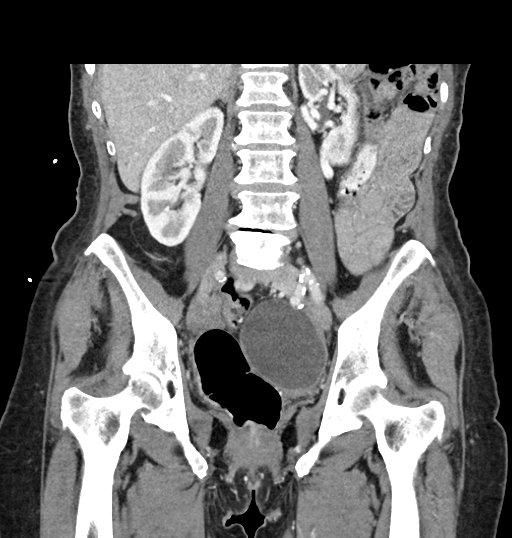

[15 of 46 positions shown; findings below may reference images not displayed]

FINDINGS: Lower chest: Partially visualized 3.3 x 2.5 cm pleural based mass in
the right middle lobe. Several additional smaller nodules noted in
the right lung base. There is thickening and nodularity of the right
lower pleural surface with multiple pleural implants/masses
consistent with metastatic disease. There is a small right pleural
effusion. Dedicated chest CT with contrast is recommended for
evaluation of the lungs.

There is no intra-abdominal free air or free fluid.

Hepatobiliary: Several subcentimeter hepatic hypodense foci measure
up to 9 mm. These lesions are too small to characterize but appears
similar to prior CT and not appear to demonstrate enhancement,
likely benign etiology such as cyst or hemangiomata. There is mild
intrahepatic biliary ductal dilatation. The gallbladder is not
visualized, likely surgically absent. No retained calcified stone
noted in the central CBD.

Pancreas: Unremarkable. No pancreatic ductal dilatation or
surrounding inflammatory changes.

Spleen: Normal in size without focal abnormality.

Adrenals/Urinary Tract: The adrenal glands are unremarkable. There
is no hydronephrosis on either side. There is symmetric enhancement
and excretion of contrast by both kidneys. The urinary bladder is
partially distended and grossly unremarkable.

Stomach/Bowel: There is sigmoid diverticulosis without active
inflammatory changes. There is no bowel obstruction or active
inflammation. The appendix is not visualized with certainty. No
inflammatory changes identified in the right lower quadrant.

Vascular/Lymphatic: There is moderate aortoiliac atherosclerotic
disease. There is a partially thrombosed 3.8 cm infrarenal aortic
aneurysm. No aortic dissection. No periaortic inflammation or
contrast extravasation. The origins of the celiac axis, SMA, IMA and
the renal arteries appear patent. The SMV, splenic vein, and main
portal vein are patent. No portal venous gas. There is no
adenopathy.

Reproductive: Hysterectomy. There is a 6.3 cm left adnexal cyst.
Further evaluation with pelvic ultrasound on a nonemergent basis
recommended.

Other: Midline vertical anterior pelvic wall incisional scar. No
fluid collection.

Musculoskeletal: Multilevel degenerative changes of the spine with
disc desiccation and vacuum phenomena. No acute osseous pathology.
No suspicious osseous lesions.
IMPRESSION: 1. No acute intra-abdominal or pelvic pathology.
2. Partially visualized right middle lobe pleural-based mass with
multiple additional smaller pulmonary nodules consistent with
metastatic disease. Diffuse thickening of the right pleural with
pleural-based nodules/masses consistent with metastatic implant. A
small right pleural effusion is also noted. Dedicated chest CT with
contrast is recommended for evaluation of the lungs.
3. Partially thrombosed 3.8 cm infrarenal aortic aneurysm. Recommend
followup by ultrasound in 2 years. This recommendation follows ACR
consensus guidelines: White Paper of the ACR Incidental Findings
Committee II on Vascular Findings. [HOSPITAL] [id number];
[DATE]. Aortic aneurysm NOS (ZJVSY-6DA.5)
4. Sigmoid diverticulosis. No bowel obstruction or active
inflammation.
5. A 6.3 cm left adnexal cyst. Further evaluation with pelvic
ultrasound on a nonemergent basis recommended.
6. Aortic Atherosclerosis ([id number]-DL4.4).

## 2021-01-29 ENCOUNTER — Other Ambulatory Visit: Payer: Self-pay | Admitting: Physician Assistant

## 2021-01-29 DIAGNOSIS — C3491 Malignant neoplasm of unspecified part of right bronchus or lung: Secondary | ICD-10-CM

## 2021-02-05 ENCOUNTER — Other Ambulatory Visit: Payer: Self-pay

## 2021-02-05 ENCOUNTER — Ambulatory Visit (INDEPENDENT_AMBULATORY_CARE_PROVIDER_SITE_OTHER): Payer: Medicare HMO | Admitting: Student in an Organized Health Care Education/Training Program

## 2021-02-05 ENCOUNTER — Encounter: Payer: Self-pay | Admitting: Student in an Organized Health Care Education/Training Program

## 2021-02-05 VITALS — BP 117/76 | HR 86 | Temp 98.5°F | Wt 136.8 lb

## 2021-02-05 DIAGNOSIS — J439 Emphysema, unspecified: Secondary | ICD-10-CM

## 2021-02-05 DIAGNOSIS — M75102 Unspecified rotator cuff tear or rupture of left shoulder, not specified as traumatic: Secondary | ICD-10-CM | POA: Diagnosis not present

## 2021-02-05 DIAGNOSIS — E785 Hyperlipidemia, unspecified: Secondary | ICD-10-CM

## 2021-02-05 DIAGNOSIS — R7302 Impaired glucose tolerance (oral): Secondary | ICD-10-CM | POA: Diagnosis not present

## 2021-02-05 DIAGNOSIS — I10 Essential (primary) hypertension: Secondary | ICD-10-CM | POA: Diagnosis not present

## 2021-02-05 DIAGNOSIS — C3491 Malignant neoplasm of unspecified part of right bronchus or lung: Secondary | ICD-10-CM

## 2021-02-05 DIAGNOSIS — Z23 Encounter for immunization: Secondary | ICD-10-CM

## 2021-02-05 LAB — POCT GLYCOSYLATED HEMOGLOBIN (HGB A1C): Hemoglobin A1C: 5.4 % (ref 4.0–5.6)

## 2021-02-05 LAB — GLUCOSE, CAPILLARY: Glucose-Capillary: 86 mg/dL (ref 70–99)

## 2021-02-05 MED ORDER — INCRUSE ELLIPTA 62.5 MCG/ACT IN AEPB: 1.0000 | INHALATION_SPRAY | Freq: Every day | RESPIRATORY_TRACT | 2 refills | Status: DC

## 2021-02-05 MED ORDER — ALBUTEROL SULFATE HFA 108 (90 BASE) MCG/ACT IN AERS: 2.0000 | INHALATION_SPRAY | Freq: Four times a day (QID) | RESPIRATORY_TRACT | 0 refills | Status: DC | PRN

## 2021-02-05 NOTE — Progress Notes (Signed)
  Subjective:     Patient ID: Debra Rodgers, female   DOB: [birth year], 75 y.o.   MRN: [id number]  Debra Rodgers is a 75 y/o who presents to clinic for follow-up. Today she expresses some concern for left arm weakness and shoulder pain, as well as headaches. The shoulder pain and arm weakness has been going on for "a few months." She reports an inability to move her arm behind her back, and sometimes employs her right arm to help with mobility in her left arm.  Her headaches began around 3 weeks ago. She describes a sensitivity to light, and feels like she is constantly hearing sounds "like an MRI".    Review of Systems  Gastrointestinal:  Positive for diarrhea.  Musculoskeletal:  Positive for arthralgias.       Left shoulder pain and arm weakness.  Neurological:  Positive for dizziness and headaches.       Headache for the past 3 weeks. Dizziness reported "not that often."      Objective:   Physical Exam Constitutional:      Appearance: Normal appearance.  HENT:     Head: Normocephalic and atraumatic.  Cardiovascular:     Rate and Rhythm: Normal rate and regular rhythm.     Heart sounds: Normal heart sounds.  Pulmonary:     Effort: Pulmonary effort is normal.     Breath sounds: Normal breath sounds.  Abdominal:     General: Abdomen is flat.     Palpations: Abdomen is soft.  Musculoskeletal:     Left shoulder: Tenderness present. No deformity or effusion. Decreased strength.     Comments: Decreased strength when abducting left arm against resistance. Inability to abduct past 90 degrees.  Neurological:     Mental Status: She is alert.       Assessment & Plan:     See problem-based charting

## 2021-02-05 NOTE — Assessment & Plan Note (Signed)
Reports using albuterol daily and sometimes twice per day, and has not been using Spiriva. She says using the Spiriva is difficult, and would rather use something that is an inhaler. Plan to discontinue Spiriva and try Incruse Ellipta.

## 2021-02-05 NOTE — Assessment & Plan Note (Addendum)
Well-controlled today 117/76.  Plan to continue with chlorthalidone and amlodipine for now as she has no significant side effects.  May be able to discontinue in the future depending on trajectory of her palliative chemotherapy.

## 2021-02-05 NOTE — Assessment & Plan Note (Signed)
Managed by Dr. Earlie Server. She is scheduled for a chemotherapy infusion this Wednesday, and seems to be tolerating treatment with good functional status. Some concern her headaches which begain in the past 3 weeks may be related to her ramucirumab, for which headaches seem to be a side effect. Instructed her to discuss this with her oncologist and contact us with any questions or if symptoms worsen.

## 2021-02-05 NOTE — Assessment & Plan Note (Addendum)
Patient with a history of ischemic vascular disease, significant atherosclerosis seen on CT imaging.  We are using atorvastatin 20 mg daily for secondary prevention of ischemic events.  Will re-check lipid panel today.  May be able to discontinue in the future depending on the trajectory of her palliative chemotherapy.

## 2021-02-05 NOTE — Telephone Encounter (Signed)
albuterol (VENTOLIN HFA) 108 (90 Base) MCG/ACT inhaler, Refill @ [id number]- [place], [place] - [address]

## 2021-02-05 NOTE — Assessment & Plan Note (Signed)
A1c 5.4% today

## 2021-02-05 NOTE — Assessment & Plan Note (Addendum)
Reports left shoulder pain for "a few months" accompanied by weakness in her left arm. On exam, no effusions noted, but ability to abduct left arm against resistance markedly diminished compared to right side, and unable to abduct to 90 degrees in the absence of resistance. She also reports a history of prior rotator cuff tear in her left arm, and her symptoms are consistent with a repeat tear. Instructed her to continue to use her arm as much as possible and abstain from using an arm sling if she can. Not a candidate for surgical intervention, so will need to work on maximizing functional status. May benefit from PT once her chemo therapy is complete and she has more time and energy.

## 2021-02-05 NOTE — Patient Instructions (Signed)
Thank you, Ms.Alejandro Petron for allowing [place] to provide your care today. Today we discussed:  Shoulder pain/weakness: continue to move your shoulder as freely as possible. Avoid using a sling as much as possible. Shortness of breath: we have prescribed a new inhaler that should help with your shortness of breath. Flu shot: given today.  I have ordered the following labs for you:  Lab Orders         Lipid Profile         POC Hbg A1C      I have ordered the following medication/changed the following medications:   Stop the following medications: Medications Discontinued During This Encounter  Medication Reason   benzonatate (TESSALON) 100 MG capsule    HYDROcodone-acetaminophen (NORCO) 7.5-325 MG tablet    tiotropium (SPIRIVA HANDIHALER) 18 MCG inhalation capsule      Start the following medications: Meds ordered this encounter  Medications   umeclidinium bromide (INCRUSE ELLIPTA) 62.5 MCG/ACT AEPB    Sig: Inhale 1 puff into the lungs daily.    Dispense:  7 each    Refill:  2     Follow up: 6 months   Remember: Please give [place] a call if any of your symptoms are worsening at any point.  Should you have any questions or concerns please call the internal medicine clinic at [phone].

## 2021-02-05 NOTE — Progress Notes (Signed)
Attestation for Student Documentation:  I personally was present and performed or re-performed the history, physical exam and medical decision-making activities of this service and have verified that the service and findings are accurately documented in the student's note.  75 year old person living with stage IV lung cancer on palliative chemotherapy here for follow-up of hypertension.  We addressed a few issues today, she stopped using Spiriva inhaler for her COPD due to difficulty with the inhaler requiring small tablets.  Difficult for her to manipulate with her osteoarthritis.  She has significant dyspnea with exertion, she is short of breath with walking around her house.  I think she could benefit from better bronchodilator therapy.  We talked about trying the Incruse Ellipta device which should be easier for her to manipulate.  She has left shoulder pain, exam consistent with the left supraspinatus rotator cuff tear, nontraumatic, likely related to degenerative changes of the tendon.  No joint effusion, no adhesive capsulitis, good range of motion below 70 degrees but significant impingement.  Not a surgical candidate, no benefit to imaging further, we will plan for supportive care for now and potentially PT after she finishes chemotherapy to help maximize functional status.  She reported mild headache a few days a week, improved with Tylenol, probably a side effect of chemotherapy, MRI brain 2 months ago did not show any metastatic disease.  We will continue supportive care for that.  [name], MD 02/05/2021, 3:10 PM

## 2021-02-06 ENCOUNTER — Other Ambulatory Visit: Payer: Self-pay | Admitting: Physician Assistant

## 2021-02-06 ENCOUNTER — Encounter: Payer: Self-pay | Admitting: Student in an Organized Health Care Education/Training Program

## 2021-02-06 ENCOUNTER — Other Ambulatory Visit: Payer: Self-pay | Admitting: *Deleted

## 2021-02-06 DIAGNOSIS — C3491 Malignant neoplasm of unspecified part of right bronchus or lung: Secondary | ICD-10-CM

## 2021-02-06 LAB — LIPID PANEL
Chol/HDL Ratio: 3.1 ratio (ref 0.0–4.4)
Cholesterol, Total: 176 mg/dL (ref 100–199)
HDL: 56 mg/dL (ref >39)
LDL Chol Calc (NIH): 104 mg/dL — ABNORMAL HIGH (ref 0–99)
Triglycerides: 85 mg/dL (ref 0–149)
VLDL Cholesterol Cal: 16 mg/dL (ref 5–40)

## 2021-02-06 MED FILL — Dexamethasone Sodium Phosphate Inj 100 MG/10ML: INTRAMUSCULAR | Qty: 1 | Status: AC

## 2021-02-07 ENCOUNTER — Other Ambulatory Visit: Payer: Self-pay

## 2021-02-07 ENCOUNTER — Encounter: Payer: Self-pay | Admitting: Internal Medicine

## 2021-02-07 ENCOUNTER — Inpatient Hospital Stay: Payer: Medicare HMO

## 2021-02-07 ENCOUNTER — Inpatient Hospital Stay: Payer: Medicare HMO | Attending: Internal Medicine

## 2021-02-07 ENCOUNTER — Other Ambulatory Visit: Payer: Self-pay | Admitting: Physician Assistant

## 2021-02-07 ENCOUNTER — Inpatient Hospital Stay (HOSPITAL_BASED_OUTPATIENT_CLINIC_OR_DEPARTMENT_OTHER): Payer: Medicare HMO | Admitting: Internal Medicine

## 2021-02-07 VITALS — BP 123/82 | HR 94 | Temp 97.3°F | Resp 18 | Ht 61.0 in | Wt 137.6 lb

## 2021-02-07 DIAGNOSIS — I714 Abdominal aortic aneurysm, without rupture, unspecified: Secondary | ICD-10-CM | POA: Diagnosis not present

## 2021-02-07 DIAGNOSIS — G4730 Sleep apnea, unspecified: Secondary | ICD-10-CM | POA: Insufficient documentation

## 2021-02-07 DIAGNOSIS — Z79899 Other long term (current) drug therapy: Secondary | ICD-10-CM | POA: Insufficient documentation

## 2021-02-07 DIAGNOSIS — C3491 Malignant neoplasm of unspecified part of right bronchus or lung: Secondary | ICD-10-CM

## 2021-02-07 DIAGNOSIS — I10 Essential (primary) hypertension: Secondary | ICD-10-CM | POA: Insufficient documentation

## 2021-02-07 DIAGNOSIS — Z95828 Presence of other vascular implants and grafts: Secondary | ICD-10-CM

## 2021-02-07 DIAGNOSIS — Z5111 Encounter for antineoplastic chemotherapy: Secondary | ICD-10-CM | POA: Insufficient documentation

## 2021-02-07 DIAGNOSIS — F17210 Nicotine dependence, cigarettes, uncomplicated: Secondary | ICD-10-CM | POA: Diagnosis not present

## 2021-02-07 DIAGNOSIS — Z86718 Personal history of other venous thrombosis and embolism: Secondary | ICD-10-CM | POA: Insufficient documentation

## 2021-02-07 DIAGNOSIS — J449 Chronic obstructive pulmonary disease, unspecified: Secondary | ICD-10-CM | POA: Insufficient documentation

## 2021-02-07 DIAGNOSIS — C3411 Malignant neoplasm of upper lobe, right bronchus or lung: Secondary | ICD-10-CM | POA: Diagnosis not present

## 2021-02-07 DIAGNOSIS — E876 Hypokalemia: Secondary | ICD-10-CM

## 2021-02-07 LAB — CMP (CANCER CENTER ONLY)
ALT: 6 U/L (ref 0–44)
AST: 12 U/L — ABNORMAL LOW (ref 15–41)
Albumin: 3.6 g/dL (ref 3.5–5.0)
Alkaline Phosphatase: 58 U/L (ref 38–126)
Anion gap: 11 (ref 5–15)
BUN: 18 mg/dL (ref 8–23)
CO2: 29 mmol/L (ref 22–32)
Calcium: 9.3 mg/dL (ref 8.9–10.3)
Chloride: 99 mmol/L (ref 98–111)
Creatinine: 0.84 mg/dL (ref 0.44–1.00)
GFR, Estimated: >60 mL/min (ref >60)
Glucose, Bld: 119 mg/dL — ABNORMAL HIGH (ref 70–99)
Potassium: 2.8 mmol/L — ABNORMAL LOW (ref 3.5–5.1)
Sodium: 139 mmol/L (ref 135–145)
Total Bilirubin: 0.6 mg/dL (ref 0.3–1.2)
Total Protein: 6.4 g/dL — ABNORMAL LOW (ref 6.5–8.1)

## 2021-02-07 LAB — CBC WITH DIFFERENTIAL (CANCER CENTER ONLY)
Abs Immature Granulocytes: 0.08 10*3/uL — ABNORMAL HIGH (ref 0.00–0.07)
Basophils Absolute: 0 10*3/uL (ref 0.0–0.1)
Basophils Relative: 0 %
Eosinophils Absolute: 0 10*3/uL (ref 0.0–0.5)
Eosinophils Relative: 0 %
HCT: 32.6 % — ABNORMAL LOW (ref 36.0–46.0)
Hemoglobin: 10.8 g/dL — ABNORMAL LOW (ref 12.0–15.0)
Immature Granulocytes: 1 %
Lymphocytes Relative: 13 %
Lymphs Abs: 1.4 10*3/uL (ref 0.7–4.0)
MCH: 33.4 pg (ref 26.0–34.0)
MCHC: 33.1 g/dL (ref 30.0–36.0)
MCV: 100.9 fL — ABNORMAL HIGH (ref 80.0–100.0)
Monocytes Absolute: 0.3 10*3/uL (ref 0.1–1.0)
Monocytes Relative: 3 %
Neutro Abs: 8.8 10*3/uL — ABNORMAL HIGH (ref 1.7–7.7)
Neutrophils Relative %: 83 %
Platelet Count: 240 10*3/uL (ref 150–400)
RBC: 3.23 MIL/uL — ABNORMAL LOW (ref 3.87–5.11)
RDW: 18.2 % — ABNORMAL HIGH (ref 11.5–15.5)
WBC Count: 10.5 10*3/uL (ref 4.0–10.5)
nRBC: 0 % (ref 0.0–0.2)

## 2021-02-07 LAB — TOTAL PROTEIN, URINE DIPSTICK

## 2021-02-07 LAB — TSH: TSH: 1.713 u[IU]/mL (ref 0.308–3.960)

## 2021-02-07 MED ORDER — SODIUM CHLORIDE 0.9 % IV SOLN: Freq: Once | INTRAVENOUS | Status: AC

## 2021-02-07 MED ORDER — SODIUM CHLORIDE 0.9 % IV SOLN
10.0000 mg | Freq: Once | INTRAVENOUS | Status: AC
  Administered 2021-02-07: 10 mg via INTRAVENOUS
  Filled 2021-02-07: qty 10

## 2021-02-07 MED ORDER — SODIUM CHLORIDE 0.9 % IV SOLN
10.0000 mg/kg | Freq: Once | INTRAVENOUS | Status: AC
  Administered 2021-02-07: 600 mg via INTRAVENOUS
  Filled 2021-02-07: qty 50

## 2021-02-07 MED ORDER — DIPHENHYDRAMINE HCL 50 MG/ML IJ SOLN
50.0000 mg | Freq: Once | INTRAMUSCULAR | Status: AC
Start: 2021-02-07 — End: 2021-02-07
  Administered 2021-02-07: 50 mg via INTRAVENOUS
  Filled 2021-02-07: qty 1

## 2021-02-07 MED ORDER — SODIUM CHLORIDE 0.9% FLUSH
10.0000 mL | INTRAVENOUS | Status: DC | PRN
  Administered 2021-02-07: 10 mL

## 2021-02-07 MED ORDER — POTASSIUM CHLORIDE CRYS ER 20 MEQ PO TBCR: 20.0000 meq | EXTENDED_RELEASE_TABLET | Freq: Two times a day (BID) | ORAL | 0 refills | Status: DC

## 2021-02-07 MED ORDER — HEPARIN SOD (PORK) LOCK FLUSH 100 UNIT/ML IV SOLN
500.0000 [IU] | Freq: Once | INTRAVENOUS | Status: AC | PRN
  Administered 2021-02-07: 500 [IU]

## 2021-02-07 MED ORDER — ACETAMINOPHEN 325 MG PO TABS
650.0000 mg | ORAL_TABLET | Freq: Once | ORAL | Status: AC
  Administered 2021-02-07: 650 mg via ORAL
  Filled 2021-02-07: qty 2

## 2021-02-07 MED ORDER — SODIUM CHLORIDE 0.9 % IV SOLN
65.0000 mg/m2 | Freq: Once | INTRAVENOUS | Status: AC
  Administered 2021-02-07: 110 mg via INTRAVENOUS
  Filled 2021-02-07: qty 11

## 2021-02-07 NOTE — Progress Notes (Signed)
Debra Rodgers [phone]   [phone]  OFFICE PROGRESS NOTE  [name], MD [place] [address]  DIAGNOSIS: Stage IV non-small cell lung cancer, squamous cell carcinoma. She presented with right upper lobe lung mass in addition to pleural-based metastasis and mediastinal lymphadenopathy. She was diagnosed in September 2020.    PRIOR THERAPY: Chemotherapy with carboplatin for an AUC of 5, paclitaxel 175 mg/m, and Keytruda 200 mg IV every 3 weeks with Neulasta support. Last dose 12/08/19. Status post 18 cycles.  Starting from cycle #5 was on maintenance treatment with single agent Keytruda every 3 weeks. This was discontinued due to evidence of disease progression.    CURRENT THERAPY: Palliative systemic chemotherapy with docetaxel 75 mg/m2 and Cyramza 10 mg/kg IV every 3 weeks with Neulasta support. First dose on 01/04/20.  Status post 18 cycles.  Starting from cycle #10 docetaxel was reduced to 65 Mg/M2  INTERVAL HISTORY: Debra Rodgers 75 y.o. female returns to the clinic today for follow-up visit.  The patient is feeling fine today with no concerning complaints except for fatigue as well as mild peripheral neuropathy in addition to nail changes and darkening of her skin.  She has been tolerating her treatment with docetaxel and Cyramza fairly well except for the above side effects.  She denied having any chest pain, shortness of breath except with exertion with no cough or hemoptysis.  She denied having any fever or chills.  She has no nausea, vomiting, diarrhea or constipation.  She has no headache or visual changes.  She has no recent weight loss or night sweats.  She is here today for evaluation before starting cycle #20 of her treatment.   MEDICAL HISTORY: Past Medical History:  Diagnosis Date   AAA (abdominal aortic aneurysm)    COPD (chronic obstructive pulmonary disease) (HCC)    Depression    DVT (deep venous  thrombosis) ([place]) 07/12/2019   Essential hypertension    Headache    History of migraine headaches    lung ca dx'd 10/2018   Sleep apnea    Tobacco use disorder     ALLERGIES:  is allergic to codeine sulfate and pantoprazole sodium.  MEDICATIONS:  Current Outpatient Medications  Medication Sig Dispense Refill   acetaminophen (TYLENOL) 500 MG tablet Take 500 mg by mouth every 6 (six) hours as needed for moderate pain.     albuterol (VENTOLIN HFA) 108 (90 Base) MCG/ACT inhaler Inhale 2 puffs into the lungs every 6 (six) hours as needed for wheezing or shortness of breath. 4 each 0   amLODipine (NORVASC) 10 MG tablet TAKE 1 TABLET EVERY DAY 90 tablet 3   atorvastatin (LIPITOR) 20 MG tablet TAKE 1 TABLET EVERY DAY 90 tablet 3   chlorthalidone (HYGROTON) 50 MG tablet TAKE 1 TABLET EVERY DAY (Patient taking differently: Take 50 mg by mouth daily.) 90 tablet 3   dexamethasone (DECADRON) 4 MG tablet TAKE 2 TABLETS TWICE DAILY THE DAY BEFORE, THE DAY OF, AND THE DAY AFTER CHEMOTHERAPY 51 tablet 2   famotidine (PEPCID) 20 MG tablet Take 20 mg by mouth daily.      FLUoxetine (PROZAC) 20 MG capsule TAKE 1 CAPSULE EVERY DAY (Patient taking differently: Take 20 mg by mouth daily.) 90 capsule 3   gabapentin (NEURONTIN) 100 MG capsule Take 1 capsule (100 mg total) by mouth 2 (two) times daily. 90 capsule 2   lidocaine-prilocaine (EMLA) cream Apply 1 application topically  as needed. 30 g 1   polyvinyl alcohol (LIQUIFILM TEARS) 1.4 % ophthalmic solution Place 1 drop into both eyes as needed for dry eyes.     potassium chloride SA (KLOR-CON) 20 MEQ tablet Take 1 tablet (20 mEq total) by mouth 2 (two) times daily. 10 tablet 0   umeclidinium bromide (INCRUSE ELLIPTA) 62.5 MCG/ACT AEPB Inhale 1 puff into the lungs daily. 7 each 2   fluticasone (FLONASE) 50 MCG/ACT nasal spray Place 1 spray into both nostrils daily. 11.1 mL 0   No current facility-administered medications for this visit.    SURGICAL HISTORY:   Past Surgical History:  Procedure Laterality Date   ABDOMINAL HYSTERECTOMY     CHOLECYSTECTOMY     IR IMAGING GUIDED PORT INSERTION  02/24/2019   ROTATOR CUFF REPAIR  3/04   VIDEO BRONCHOSCOPY WITH ENDOBRONCHIAL ULTRASOUND Right 11/25/2018   Procedure: VIDEO BRONCHOSCOPY WITH ENDOBRONCHIAL ULTRASOUND;  Surgeon: Collene Gobble, MD;  Location: [facility] OR;  Service: Thoracic;  Laterality: Right;    REVIEW OF SYSTEMS:  A comprehensive review of systems was negative except for: Constitutional: positive for fatigue Respiratory: positive for dyspnea on exertion Musculoskeletal: positive for arthralgias   PHYSICAL EXAMINATION: General appearance: alert, cooperative, fatigued, and no distress Head: Normocephalic, without obvious abnormality, atraumatic Neck: no adenopathy, no JVD, supple, symmetrical, trachea midline, and thyroid not enlarged, symmetric, no tenderness/mass/nodules Lymph nodes: Cervical, supraclavicular, and axillary nodes normal. Resp: clear to auscultation bilaterally Back: symmetric, no curvature. ROM normal. No CVA tenderness. Cardio: regular rate and rhythm, S1, S2 normal, no murmur, click, rub or gallop GI: soft, non-tender; bowel sounds normal; no masses,  no organomegaly Extremities: extremities normal, atraumatic, no cyanosis or edema  ECOG PERFORMANCE STATUS: 1 - Symptomatic but completely ambulatory  Blood pressure 123/82, pulse 94, temperature (!) 97.3 F (36.3 C), temperature source Tympanic, resp. rate 18, height 5' 1"  (1.549 m), weight 137 lb 9.6 oz (62.4 kg), SpO2 98 %.  LABORATORY DATA: Lab Results  Component Value Date   WBC 10.5 02/07/2021   HGB 10.8 (L) 02/07/2021   HCT 32.6 (L) 02/07/2021   MCV 100.9 (H) 02/07/2021   PLT 240 02/07/2021      Chemistry      Component Value Date/Time   NA 139 01/17/2021 0923   NA 139 07/21/2017 0958   K 3.1 (L) 01/17/2021 0923   CL 100 01/17/2021 0923   CO2 27 01/17/2021 0923   BUN 24 (H) 01/17/2021 0923   BUN 26  07/21/2017 0958   CREATININE 0.83 01/17/2021 0923   CREATININE 0.79 09/03/2013 1540      Component Value Date/Time   CALCIUM 9.4 01/17/2021 0923   ALKPHOS 59 01/17/2021 0923   AST 12 (L) 01/17/2021 0923   ALT 6 01/17/2021 0923   BILITOT 0.7 01/17/2021 0923       RADIOGRAPHIC STUDIES: No results found.  ASSESSMENT AND PLAN: This is a very pleasant 75 years old African-American female with stage IV non-small cell carcinoma,, squamous cell carcinoma diagnosed in September 2020.  She presented with extensive right-sided pleural and thoracic nodal hypermetabolic disease with no extrathoracic disease. The patient started induction treatment with systemic chemotherapy with carboplatin, paclitaxel and Keytruda status post 4 cycles with partial response after cycle #4.  This was followed by 14 cycles of maintenance treatment with single agent Keytruda discontinued secondary to disease progression. The patient is currently undergoing second line systemic chemotherapy with docetaxel 75 mg/M2 and Cyramza 10 mg/KG every 3 weeks with Neulasta support.  Status post 19 cycles.  Starting from cycle #10 her dose of docetaxel was reduced to 65 Mg/M2. The patient continues to tolerate this treatment well except for fatigue. I recommended for her to proceed with cycle #20 today as planned. I will see her back for follow-up visit in 3 weeks for evaluation before starting the next cycle of her treatment. The patient was advised to call immediately if she has any concerning symptoms in the interval.  The patient voices understanding of current disease status and treatment options and is in agreement with the current care plan. All questions were answered. The patient knows to call the clinic with any problems, questions or concerns. We can certainly see the patient much sooner if necessary.   Disclaimer: This note was dictated with voice recognition software. Similar sounding words can inadvertently be  transcribed and may not be corrected upon review.

## 2021-02-07 NOTE — Patient Instructions (Signed)
[facility]  Discharge Instructions: Thank you for choosing [facility] to provide your oncology and hematology care.   If you have a lab appointment with the [facility], please go directly to the [facility] and check in at the registration area.   Wear comfortable clothing and clothing appropriate for easy access to any Portacath or PICC line.   We strive to give you quality time with your provider. You may need to reschedule your appointment if you arrive late (15 or more minutes).  Arriving late affects you and other patients whose appointments are after yours.  Also, if you miss three or more appointments without notifying the office, you may be dismissed from the clinic at the provider's discretion.      For prescription refill requests, have your pharmacy contact our office and allow 72 hours for refills to be completed.    Today you received the following chemotherapy and/or immunotherapy agents Cyramza/Taxotere      To help prevent nausea and vomiting after your treatment, we encourage you to take your nausea medication as directed.  BELOW ARE SYMPTOMS THAT SHOULD BE REPORTED IMMEDIATELY: *FEVER GREATER THAN 100.4 F (38 C) OR HIGHER *CHILLS OR SWEATING *NAUSEA AND VOMITING THAT IS NOT CONTROLLED WITH YOUR NAUSEA MEDICATION *UNUSUAL SHORTNESS OF BREATH *UNUSUAL BRUISING OR BLEEDING *URINARY PROBLEMS (pain or burning when urinating, or frequent urination) *BOWEL PROBLEMS (unusual diarrhea, constipation, pain near the anus) TENDERNESS IN MOUTH AND THROAT WITH OR WITHOUT PRESENCE OF ULCERS (sore throat, sores in mouth, or a toothache) UNUSUAL RASH, SWELLING OR PAIN  UNUSUAL VAGINAL DISCHARGE OR ITCHING   Items with * indicate a potential emergency and should be followed up as soon as possible or go to the Emergency Department if any problems should occur.  Please show the CHEMOTHERAPY ALERT CARD or IMMUNOTHERAPY ALERT CARD at  check-in to the Emergency Department and triage nurse.  Should you have questions after your visit or need to cancel or reschedule your appointment, please contact [facility]  Dept: [phone]  and follow the prompts.  Office hours are 8:00 a.m. to 4:30 p.m. Monday - Friday. Please note that voicemails left after 4:00 p.m. may not be returned until the following business day.  We are closed weekends and major holidays. You have access to a nurse at all times for urgent questions. Please call the main number to the clinic Dept: [phone] and follow the prompts.   For any non-urgent questions, you may also contact your provider using MyChart. We now offer e-Visits for anyone 37 and older to request care online for non-urgent symptoms. For details visit [email or web address].   Also download the MyChart app! Go to the app store, search "MyChart", open the app, select [facility], and log in with your MyChart username and password.  Due to Covid, a mask is required upon entering the hospital/clinic. If you do not have a mask, one will be given to you upon arrival. For doctor visits, patients may have 1 support person aged 32 or older with them. For treatment visits, patients cannot have anyone with them due to current Covid guidelines and our immunocompromised population.

## 2021-02-09 ENCOUNTER — Inpatient Hospital Stay: Payer: Medicare HMO

## 2021-02-09 ENCOUNTER — Other Ambulatory Visit: Payer: Self-pay

## 2021-02-09 VITALS — BP 140/90 | HR 79 | Temp 98.7°F | Resp 16

## 2021-02-09 DIAGNOSIS — F17210 Nicotine dependence, cigarettes, uncomplicated: Secondary | ICD-10-CM | POA: Diagnosis not present

## 2021-02-09 DIAGNOSIS — C3491 Malignant neoplasm of unspecified part of right bronchus or lung: Secondary | ICD-10-CM

## 2021-02-09 DIAGNOSIS — I10 Essential (primary) hypertension: Secondary | ICD-10-CM | POA: Diagnosis not present

## 2021-02-09 DIAGNOSIS — C3411 Malignant neoplasm of upper lobe, right bronchus or lung: Secondary | ICD-10-CM | POA: Diagnosis not present

## 2021-02-09 DIAGNOSIS — I714 Abdominal aortic aneurysm, without rupture, unspecified: Secondary | ICD-10-CM | POA: Diagnosis not present

## 2021-02-09 DIAGNOSIS — Z86718 Personal history of other venous thrombosis and embolism: Secondary | ICD-10-CM | POA: Diagnosis not present

## 2021-02-09 DIAGNOSIS — Z5111 Encounter for antineoplastic chemotherapy: Secondary | ICD-10-CM | POA: Diagnosis not present

## 2021-02-09 DIAGNOSIS — Z79899 Other long term (current) drug therapy: Secondary | ICD-10-CM | POA: Diagnosis not present

## 2021-02-09 DIAGNOSIS — G4730 Sleep apnea, unspecified: Secondary | ICD-10-CM | POA: Diagnosis not present

## 2021-02-09 DIAGNOSIS — J449 Chronic obstructive pulmonary disease, unspecified: Secondary | ICD-10-CM | POA: Diagnosis not present

## 2021-02-09 MED ORDER — PEGFILGRASTIM-JMDB 6 MG/0.6ML ~~LOC~~ SOSY
6.0000 mg | PREFILLED_SYRINGE | Freq: Once | SUBCUTANEOUS | Status: AC
  Administered 2021-02-09: 6 mg via SUBCUTANEOUS
  Filled 2021-02-09: qty 0.6

## 2021-02-09 NOTE — Patient Instructions (Signed)

## 2021-02-14 ENCOUNTER — Other Ambulatory Visit: Payer: Medicare HMO

## 2021-02-14 ENCOUNTER — Other Ambulatory Visit: Payer: Self-pay

## 2021-02-14 ENCOUNTER — Inpatient Hospital Stay: Payer: Medicare HMO

## 2021-02-14 DIAGNOSIS — Z79899 Other long term (current) drug therapy: Secondary | ICD-10-CM | POA: Diagnosis not present

## 2021-02-14 DIAGNOSIS — J449 Chronic obstructive pulmonary disease, unspecified: Secondary | ICD-10-CM | POA: Diagnosis not present

## 2021-02-14 DIAGNOSIS — Z86718 Personal history of other venous thrombosis and embolism: Secondary | ICD-10-CM | POA: Diagnosis not present

## 2021-02-14 DIAGNOSIS — I714 Abdominal aortic aneurysm, without rupture, unspecified: Secondary | ICD-10-CM | POA: Diagnosis not present

## 2021-02-14 DIAGNOSIS — C3411 Malignant neoplasm of upper lobe, right bronchus or lung: Secondary | ICD-10-CM | POA: Diagnosis not present

## 2021-02-14 DIAGNOSIS — C3491 Malignant neoplasm of unspecified part of right bronchus or lung: Secondary | ICD-10-CM

## 2021-02-14 DIAGNOSIS — I10 Essential (primary) hypertension: Secondary | ICD-10-CM | POA: Diagnosis not present

## 2021-02-14 DIAGNOSIS — Z5111 Encounter for antineoplastic chemotherapy: Secondary | ICD-10-CM | POA: Diagnosis not present

## 2021-02-14 DIAGNOSIS — Z95828 Presence of other vascular implants and grafts: Secondary | ICD-10-CM

## 2021-02-14 DIAGNOSIS — F17210 Nicotine dependence, cigarettes, uncomplicated: Secondary | ICD-10-CM | POA: Diagnosis not present

## 2021-02-14 DIAGNOSIS — G4730 Sleep apnea, unspecified: Secondary | ICD-10-CM | POA: Diagnosis not present

## 2021-02-14 LAB — CBC WITH DIFFERENTIAL (CANCER CENTER ONLY)
Abs Immature Granulocytes: 0.63 10*3/uL — ABNORMAL HIGH (ref 0.00–0.07)
Basophils Absolute: 0.1 10*3/uL (ref 0.0–0.1)
Basophils Relative: 1 %
Eosinophils Absolute: 0 10*3/uL (ref 0.0–0.5)
Eosinophils Relative: 0 %
HCT: 30.8 % — ABNORMAL LOW (ref 36.0–46.0)
Hemoglobin: 10.1 g/dL — ABNORMAL LOW (ref 12.0–15.0)
Immature Granulocytes: 13 %
Lymphocytes Relative: 42 %
Lymphs Abs: 2 10*3/uL (ref 0.7–4.0)
MCH: 33.4 pg (ref 26.0–34.0)
MCHC: 32.8 g/dL (ref 30.0–36.0)
MCV: 102 fL — ABNORMAL HIGH (ref 80.0–100.0)
Monocytes Absolute: 0.8 10*3/uL (ref 0.1–1.0)
Monocytes Relative: 17 %
Neutro Abs: 1.3 10*3/uL — ABNORMAL LOW (ref 1.7–7.7)
Neutrophils Relative %: 27 %
Platelet Count: 80 10*3/uL — ABNORMAL LOW (ref 150–400)
RBC: 3.02 MIL/uL — ABNORMAL LOW (ref 3.87–5.11)
RDW: 17.6 % — ABNORMAL HIGH (ref 11.5–15.5)
WBC Count: 4.8 10*3/uL (ref 4.0–10.5)
nRBC: 0 % (ref 0.0–0.2)

## 2021-02-14 LAB — CMP (CANCER CENTER ONLY)
ALT: 8 U/L (ref 0–44)
AST: 14 U/L — ABNORMAL LOW (ref 15–41)
Albumin: 3.3 g/dL — ABNORMAL LOW (ref 3.5–5.0)
Alkaline Phosphatase: 69 U/L (ref 38–126)
Anion gap: 9 (ref 5–15)
BUN: 23 mg/dL (ref 8–23)
CO2: 29 mmol/L (ref 22–32)
Calcium: 9 mg/dL (ref 8.9–10.3)
Chloride: 101 mmol/L (ref 98–111)
Creatinine: 0.85 mg/dL (ref 0.44–1.00)
GFR, Estimated: >60 mL/min (ref >60)
Glucose, Bld: 89 mg/dL (ref 70–99)
Potassium: 3.2 mmol/L — ABNORMAL LOW (ref 3.5–5.1)
Sodium: 139 mmol/L (ref 135–145)
Total Bilirubin: 1.2 mg/dL (ref 0.3–1.2)
Total Protein: 6.2 g/dL — ABNORMAL LOW (ref 6.5–8.1)

## 2021-02-14 MED ORDER — HEPARIN SOD (PORK) LOCK FLUSH 100 UNIT/ML IV SOLN
500.0000 [IU] | Freq: Once | INTRAVENOUS | Status: AC | PRN
  Administered 2021-02-14: 500 [IU]

## 2021-02-14 MED ORDER — SODIUM CHLORIDE 0.9% FLUSH
10.0000 mL | INTRAVENOUS | Status: DC | PRN
  Administered 2021-02-14: 10 mL

## 2021-02-19 ENCOUNTER — Other Ambulatory Visit: Payer: Self-pay | Admitting: Physician Assistant

## 2021-02-19 DIAGNOSIS — E876 Hypokalemia: Secondary | ICD-10-CM

## 2021-02-19 MED ORDER — POTASSIUM CHLORIDE CRYS ER 20 MEQ PO TBCR: 20.0000 meq | EXTENDED_RELEASE_TABLET | Freq: Every day | ORAL | 0 refills | Status: DC

## 2021-02-21 ENCOUNTER — Telehealth: Payer: Self-pay

## 2021-02-21 ENCOUNTER — Other Ambulatory Visit: Payer: Self-pay

## 2021-02-21 ENCOUNTER — Inpatient Hospital Stay: Payer: Medicare HMO

## 2021-02-21 ENCOUNTER — Encounter: Payer: Self-pay | Admitting: *Deleted

## 2021-02-21 DIAGNOSIS — C3491 Malignant neoplasm of unspecified part of right bronchus or lung: Secondary | ICD-10-CM

## 2021-02-21 DIAGNOSIS — J449 Chronic obstructive pulmonary disease, unspecified: Secondary | ICD-10-CM | POA: Diagnosis not present

## 2021-02-21 DIAGNOSIS — Z95828 Presence of other vascular implants and grafts: Secondary | ICD-10-CM

## 2021-02-21 DIAGNOSIS — Z5111 Encounter for antineoplastic chemotherapy: Secondary | ICD-10-CM | POA: Diagnosis not present

## 2021-02-21 DIAGNOSIS — I714 Abdominal aortic aneurysm, without rupture, unspecified: Secondary | ICD-10-CM | POA: Diagnosis not present

## 2021-02-21 DIAGNOSIS — G4730 Sleep apnea, unspecified: Secondary | ICD-10-CM | POA: Diagnosis not present

## 2021-02-21 DIAGNOSIS — F17210 Nicotine dependence, cigarettes, uncomplicated: Secondary | ICD-10-CM | POA: Diagnosis not present

## 2021-02-21 DIAGNOSIS — Z86718 Personal history of other venous thrombosis and embolism: Secondary | ICD-10-CM | POA: Diagnosis not present

## 2021-02-21 DIAGNOSIS — Z79899 Other long term (current) drug therapy: Secondary | ICD-10-CM | POA: Diagnosis not present

## 2021-02-21 DIAGNOSIS — E876 Hypokalemia: Secondary | ICD-10-CM

## 2021-02-21 DIAGNOSIS — I10 Essential (primary) hypertension: Secondary | ICD-10-CM | POA: Diagnosis not present

## 2021-02-21 DIAGNOSIS — C3411 Malignant neoplasm of upper lobe, right bronchus or lung: Secondary | ICD-10-CM | POA: Diagnosis not present

## 2021-02-21 LAB — CMP (CANCER CENTER ONLY)
ALT: 9 U/L (ref 0–44)
AST: 16 U/L (ref 15–41)
Albumin: 3.6 g/dL (ref 3.5–5.0)
Alkaline Phosphatase: 81 U/L (ref 38–126)
Anion gap: 7 (ref 5–15)
BUN: 20 mg/dL (ref 8–23)
CO2: 31 mmol/L (ref 22–32)
Calcium: 8.8 mg/dL — ABNORMAL LOW (ref 8.9–10.3)
Chloride: 100 mmol/L (ref 98–111)
Creatinine: 0.78 mg/dL (ref 0.44–1.00)
GFR, Estimated: >60 mL/min (ref >60)
Glucose, Bld: 94 mg/dL (ref 70–99)
Potassium: 2.6 mmol/L — CL (ref 3.5–5.1)
Sodium: 138 mmol/L (ref 135–145)
Total Bilirubin: 0.8 mg/dL (ref 0.3–1.2)
Total Protein: 6.3 g/dL — ABNORMAL LOW (ref 6.5–8.1)

## 2021-02-21 LAB — CBC WITH DIFFERENTIAL (CANCER CENTER ONLY)
Abs Immature Granulocytes: 0.16 10*3/uL — ABNORMAL HIGH (ref 0.00–0.07)
Basophils Absolute: 0 10*3/uL (ref 0.0–0.1)
Basophils Relative: 0 %
Eosinophils Absolute: 0 10*3/uL (ref 0.0–0.5)
Eosinophils Relative: 0 %
HCT: 32.4 % — ABNORMAL LOW (ref 36.0–46.0)
Hemoglobin: 10.5 g/dL — ABNORMAL LOW (ref 12.0–15.0)
Immature Granulocytes: 1 %
Lymphocytes Relative: 20 %
Lymphs Abs: 2.5 10*3/uL (ref 0.7–4.0)
MCH: 32.8 pg (ref 26.0–34.0)
MCHC: 32.4 g/dL (ref 30.0–36.0)
MCV: 101.3 fL — ABNORMAL HIGH (ref 80.0–100.0)
Monocytes Absolute: 0.4 10*3/uL (ref 0.1–1.0)
Monocytes Relative: 4 %
Neutro Abs: 9.1 10*3/uL — ABNORMAL HIGH (ref 1.7–7.7)
Neutrophils Relative %: 75 %
Platelet Count: 127 10*3/uL — ABNORMAL LOW (ref 150–400)
RBC: 3.2 MIL/uL — ABNORMAL LOW (ref 3.87–5.11)
RDW: 17.9 % — ABNORMAL HIGH (ref 11.5–15.5)
WBC Count: 12.2 10*3/uL — ABNORMAL HIGH (ref 4.0–10.5)
nRBC: 0.2 % (ref 0.0–0.2)

## 2021-02-21 LAB — TOTAL PROTEIN, URINE DIPSTICK: Protein, ur: NEGATIVE mg/dL

## 2021-02-21 MED ORDER — ALTEPLASE 2 MG IJ SOLR: 2.0000 mg | Freq: Once | INTRAMUSCULAR | Status: DC | PRN

## 2021-02-21 MED ORDER — HEPARIN SOD (PORK) LOCK FLUSH 100 UNIT/ML IV SOLN
500.0000 [IU] | Freq: Once | INTRAVENOUS | Status: AC | PRN
  Administered 2021-02-21: 500 [IU]

## 2021-02-21 MED ORDER — SODIUM CHLORIDE 0.9% FLUSH
10.0000 mL | INTRAVENOUS | Status: DC | PRN
  Administered 2021-02-21: 10 mL

## 2021-02-21 MED ORDER — POTASSIUM CHLORIDE CRYS ER 20 MEQ PO TBCR: 20.0000 meq | EXTENDED_RELEASE_TABLET | Freq: Two times a day (BID) | ORAL | 0 refills | Status: DC

## 2021-02-21 NOTE — Progress Notes (Unsigned)
CRITICAL POTASSIUM RESULT OF 2.6 CALLED TO, READ BACK BY AND VERIFIED WITH ANSYI SILAS RN @ [facility] ON 02/21/21

## 2021-02-21 NOTE — Telephone Encounter (Signed)
CRITICAL VALUE STICKER  CRITICAL VALUE: K = 2.6  RECEIVER (on-site recipient of call): Yetta Glassman, CMA  DATE & TIME NOTIFIED: 02/21/21 at 11:10am  MESSENGER (representative from lab): Ulice Dash  MD NOTIFIED: He[place]oetter PA-C  TIME OF NOTIFICATION: 02/21/21 at 11:15am  RESPONSE: Per Cassandra PA-C, pt to take Potassium 63m QD for 10 days.  I've spoken to the pt and advised of this. She expressed understanding of this information. Rx has been sent.

## 2021-02-24 NOTE — Progress Notes (Signed)
[facility] OFFICE PROGRESS NOTE  Debra Filler, MD [address] [name] [address]  DIAGNOSIS: Stage IV non-small cell lung cancer, squamous cell carcinoma. She presented with right upper lobe lung mass in addition to pleural-based metastasis and mediastinal lymphadenopathy. She was diagnosed in September 2020.   PRIOR THERAPY: Chemotherapy with carboplatin for an AUC of 5, paclitaxel 175 mg/m, and Keytruda 200 mg IV every 3 weeks with Neulasta support. Last dose 12/08/19. Status post 18 cycles.  Starting from cycle #5 was on maintenance treatment with single agent Keytruda every 3 weeks. This was discontinued due to evidence of disease progression.  CURRENT THERAPY:  Palliative systemic chemotherapy with docetaxel 75 mg per metered squared and Cyramza 10 mg/kg IV every 3 weeks with Neulasta support. First dose on 01/04/20. Status post 20  cycles. Her dose of docetaxel was reduced to 65 mg/m2 starting from cycle #10 due to nail changes and peripheral neuropathy.    INTERVAL HISTORY: Debra Rodgers 75 y.o. female returns to the clinic today a follow-up visit.  The patient is feeling fairly well today without any concerning complaints except for she woke up this morning with a right subconjunctival hemorrhage.  The patient denies any recent vomiting, straining, or unusual coughing spells.  She denies any eye pain or visual changes.  She is able to perform her normal extraocular movements.   The patient is currently undergoing her chemotherapy with docetaxel with reduced dose of 65 mg per metered squared and Cyramza.  She is tolerating it fairly well except for fatigue following treatment.  Her dose of docetaxel was reduced to 65 mg per metered squared due to skin and nail changes as well as some peripheral neuropathy.  Overall, she denies any fever, chills, or recent weight loss.  She sometimes has baseline intermittent night sweats which is unchanged.  She reports  baseline dyspnea on exertion and chronic cough.  She has Robitussin for cough.  She has inhalers if needed for wheezing secondary to her COPD.  Her inhalers were adjusted in November 2022 by her primary care provider.  She was unable to pick one of them up yet and is going to call her primary care office later today to figure out how to go about getting this prescription.  Denies any hemoptysis or chest pain.  Denies any nausea, vomiting, or constipation.  She reports baseline intermittent diarrhea which has been going on for many years, although she notes this has improved lately due to changes in her dietary habits with eating more bread and potatoes.  She sometimes has headaches that "come and go".  The patient had a restaging brain MRI at the end of September 2022 to evaluate this which did not show any evidence of metastatic disease to the brain.  It did note some sinus mucosal thickening, for which the patient started taking Claritin daily.  The patient also mention she has mild bilateral ankle swelling.  The patient is here today for evaluation and repeat blood work before starting her next cycle of treatment.   MEDICAL HISTORY: Past Medical History:  Diagnosis Date   AAA (abdominal aortic aneurysm)    COPD (chronic obstructive pulmonary disease) (HCC)    Depression    DVT (deep venous thrombosis) ([facility]) 07/12/2019   Essential hypertension    Headache    History of migraine headaches    lung ca dx'd 10/2018   Sleep apnea    Tobacco use disorder  ALLERGIES:  is allergic to codeine sulfate and pantoprazole sodium.  MEDICATIONS:  Current Outpatient Medications  Medication Sig Dispense Refill   acetaminophen (TYLENOL) 500 MG tablet Take 500 mg by mouth every 6 (six) hours as needed for moderate pain.     albuterol (VENTOLIN HFA) 108 (90 Base) MCG/ACT inhaler Inhale 2 puffs into the lungs every 6 (six) hours as needed for wheezing or shortness of breath. 4 each 0   amLODipine (NORVASC) 10  MG tablet TAKE 1 TABLET EVERY DAY 90 tablet 3   atorvastatin (LIPITOR) 20 MG tablet TAKE 1 TABLET EVERY DAY 90 tablet 3   chlorthalidone (HYGROTON) 50 MG tablet TAKE 1 TABLET EVERY DAY (Patient taking differently: Take 50 mg by mouth daily.) 90 tablet 3   dexamethasone (DECADRON) 4 MG tablet TAKE 2 TABLETS TWICE DAILY THE DAY BEFORE, THE DAY OF, AND THE DAY AFTER CHEMOTHERAPY 51 tablet 2   famotidine (PEPCID) 20 MG tablet Take 20 mg by mouth daily.      FLUoxetine (PROZAC) 20 MG capsule TAKE 1 CAPSULE EVERY DAY (Patient taking differently: Take 20 mg by mouth daily.) 90 capsule 3   fluticasone (FLONASE) 50 MCG/ACT nasal spray Place 1 spray into both nostrils daily. 11.1 mL 0   gabapentin (NEURONTIN) 100 MG capsule Take 1 capsule (100 mg total) by mouth 2 (two) times daily. 90 capsule 2   lidocaine-prilocaine (EMLA) cream Apply 1 application topically as needed. 30 g 1   polyvinyl alcohol (LIQUIFILM TEARS) 1.4 % ophthalmic solution Place 1 drop into both eyes as needed for dry eyes.     potassium chloride SA (KLOR-CON M) 20 MEQ tablet Take 1 tablet (20 mEq total) by mouth 2 (two) times daily. 20 tablet 0   umeclidinium bromide (INCRUSE ELLIPTA) 62.5 MCG/ACT AEPB Inhale 1 puff into the lungs daily. 7 each 2   No current facility-administered medications for this visit.    SURGICAL HISTORY:  Past Surgical History:  Procedure Laterality Date   ABDOMINAL HYSTERECTOMY     CHOLECYSTECTOMY     IR IMAGING GUIDED PORT INSERTION  02/24/2019   ROTATOR CUFF REPAIR  3/04   VIDEO BRONCHOSCOPY WITH ENDOBRONCHIAL ULTRASOUND Right 11/25/2018   Procedure: VIDEO BRONCHOSCOPY WITH ENDOBRONCHIAL ULTRASOUND;  Surgeon: Collene Gobble, MD;  Location: [facility] OR;  Service: Thoracic;  Laterality: Right;    REVIEW OF SYSTEMS:   Constitutional: Positive for fatigue. Negative for weight loss and fever HENT: Negative for nosebleeds, sore throat and trouble swallowing.   Eyes: Positive for subconjunctival hemorrhage in right  eye.  Denies visual changes or pain.  Negative for eye problems and icterus.  Respiratory: Positive for baseline cough and baseline dyspnea on exertion. Positive for intermittent wheezing (none at this time). Negative for hemoptysis. Cardiovascular: Negative for chest pain.  Positive for mild bilateral ankle swelling. Gastrointestinal: Positive for baseline intermittent diarrhea (controlled and improved recently). Negative for abdominal pain, constipation, nausea and vomiting. Genitourinary: Negative for bladder incontinence, difficulty urinating, dysuria, frequency and hematuria.   Musculoskeletal: Negative for back pain, gait problem, neck pain and neck stiffness.  Skin: Positive for nail darkening and falling off.  Negative for itching and rash.  Neurological: Negative for dizziness, headaches, extremity weakness, gait problem, light-headedness and seizures.  Hematological: Negative for adenopathy. Does not bruise/bleed easily.  Psychiatric/Behavioral: Negative for confusion, depression and sleep disturbance. The patient is not nervous/anxious.     PHYSICAL EXAMINATION:  Blood pressure 129/87, pulse 98, temperature (!) 97.2 F (36.2 C), resp. rate 18, weight  139 lb 8 oz (63.3 kg), SpO2 97 %.  ECOG PERFORMANCE STATUS: 1  Physical Exam  Constitutional: Oriented to person, place, and time and well-developed, well-nourished, and in no distress.  HENT:  Head: Normocephalic and atraumatic.  Mouth/Throat: Oropharynx is clear and moist. No oropharyngeal exudate.  Eyes: Subconjunctival hemorrhage in right eye. Neck: Normal range of motion. Neck supple.  Cardiovascular: Normal rate, regular rhythm, normal heart sounds and intact distal pulses.   Pulmonary/Chest: Effort normal and breath sounds normal. No respiratory distress. No wheezes. No rales.  Abdominal: Soft. Bowel sounds are normal. Exhibits no distension and no mass. There is no tenderness.  Musculoskeletal: Normal range of motion.   Mild/minimal bilateral ankle edema. Lymphadenopathy:    No cervical adenopathy.  Neurological: Alert and oriented to person, place, and time. Exhibits normal muscle tone. Gait normal. Coordination normal.  Ambulates with a cane. Skin: Darkening and cracking of finger nails. Skin is warm and dry. No rash noted. Not diaphoretic. No erythema. No pallor.  Psychiatric: Mood, memory and judgment normal.  Vitals reviewed.  LABORATORY DATA: Lab Results  Component Value Date   WBC 11.5 (H) 02/28/2021   HGB 10.3 (L) 02/28/2021   HCT 31.4 (L) 02/28/2021   MCV 101.6 (H) 02/28/2021   PLT 215 02/28/2021      Chemistry      Component Value Date/Time   NA 139 02/28/2021 0940   NA 139 07/21/2017 0958   K 3.6 02/28/2021 0940   CL 102 02/28/2021 0940   CO2 26 02/28/2021 0940   BUN 23 02/28/2021 0940   BUN 26 07/21/2017 0958   CREATININE 0.83 02/28/2021 0940   CREATININE 0.79 09/03/2013 1540      Component Value Date/Time   CALCIUM 9.3 02/28/2021 0940   ALKPHOS 65 02/28/2021 0940   AST 15 02/28/2021 0940   ALT 6 02/28/2021 0940   BILITOT 0.7 02/28/2021 0940       RADIOGRAPHIC STUDIES:  No results found.   ASSESSMENT/PLAN:  This is a very pleasant 75 year old African-American female diagnosed with stage IV non-small cell lung cancer, squamous cell carcinoma.  She presented with a right upper lobe lung mass and pleural based metastases and mediastinal lymphadenopathy. She was diagnosed in August 2020    The patient was on systemic chemotherapy with carboplatin for an AUC of 5, paclitaxel 175 mg/m, and Keytruda 200 mg IV every 3 weeks with Neulasta support.  Starting from cycle #5, she was on maintenance with single agent Keytruda.  She is status post 18 cycles of treatment and she tolerated it well without any concerning adverse effects except for mild fatigue. This was discontinued secondary to evidence of disease progression.   The patient is currently undergoing palliative systemic  chemotherapy with docetaxel 75 mg per metered squared and Cyramza 10 mg/kg IV every 3 weeks with Neulasta support. She is status post 20 cycles and she is tolerating this well despite the tough treatment. She has some nail darkening and chipping of her nails. Her dose of docetaxel was reduced to 65 mg/m2 starting from cycle #10 due to peripheral neuropathy.     Labs were reviewed. Recommend she proceed with cycle #21 today as scheduled.    We will see her back for a follow up visit in 3 weeks for evaluation before starting cycle #22.  I will arrange for restaging CT of the chest, abdomen, and pelvis prior to starting her next cycle of treatment.  She drinks down the water down contrast at the  radiology department.  For her peripheral neuropathy, she ran out of gabapentin and has not been taking this.  I will send refill to her pharmacy  She is going to call her primary care office to follow-up on the new inhaler she was supposed to have started.  The patient tends to have hypokalemia on labs.  I will check a magnesium at her next lab draw to ensure that she does not also have hypomagnesemia contributing to the hypokalemia.  The patient has a new right subconjunctival hemorrhage.  The patient denies any eye pain or vision changes.  Discussed that this should resolve in time, although sometimes it looks worse before it gets better.  Reviewed with Dr. Julien Nordmann who is in agreement and that she ok to proceed with Cyramza and docetaxel today.  Reviewed red flags of the eyes with the patient.  Reviewed that if she develops any visual changes, eye pain, or pain or changes with extraocular movements that she needs to seek emergency evaluation.   She has mild bilateral ankle swelling.  No calf pain, erythema, or swelling.  Encouraged the patient to purchase compression stockings, elevate her lower extremities, and avoid a high salt diet.  The patient was advised to call immediately if she has any concerning  symptoms in the interval. The patient voices understanding of current disease status and treatment options and is in agreement with the current care plan. All questions were answered. The patient knows to call the clinic with any problems, questions or concerns. We can certainly see the patient much sooner if necessary      Orders Placed This Encounter  Procedures   CT Chest W Contrast    Standing Status:   Future    Standing Expiration Date:   02/28/2022    Order Specific Question:   If indicated for the ordered procedure, I authorize the administration of contrast media per Radiology protocol    Answer:   Yes    Order Specific Question:   Preferred imaging location?    Answer:   [facility]   CT Abdomen Pelvis W Contrast    Standing Status:   Future    Standing Expiration Date:   02/28/2022    Order Specific Question:   If indicated for the ordered procedure, I authorize the administration of contrast media per Radiology protocol    Answer:   Yes    Order Specific Question:   Preferred imaging location?    Answer:   [facility]    Order Specific Question:   Is Oral Contrast requested for this exam?    Answer:   Yes, Per Radiology protocol   Magnesium    Standing Status:   Future    Standing Expiration Date:   02/28/2022     The total time spent in the appointment was 20-29 minutes.   Risa Auman L Gardner Servantes, PA-C 02/28/21

## 2021-02-28 ENCOUNTER — Inpatient Hospital Stay: Payer: Medicare HMO

## 2021-02-28 ENCOUNTER — Inpatient Hospital Stay: Payer: Medicare HMO | Attending: Internal Medicine

## 2021-02-28 ENCOUNTER — Inpatient Hospital Stay (HOSPITAL_BASED_OUTPATIENT_CLINIC_OR_DEPARTMENT_OTHER): Payer: Medicare HMO | Admitting: Physician Assistant

## 2021-02-28 ENCOUNTER — Other Ambulatory Visit: Payer: Self-pay

## 2021-02-28 VITALS — BP 129/87 | HR 98 | Temp 97.2°F | Resp 18 | Wt 139.5 lb

## 2021-02-28 DIAGNOSIS — C3411 Malignant neoplasm of upper lobe, right bronchus or lung: Secondary | ICD-10-CM | POA: Diagnosis not present

## 2021-02-28 DIAGNOSIS — M25473 Effusion, unspecified ankle: Secondary | ICD-10-CM | POA: Diagnosis not present

## 2021-02-28 DIAGNOSIS — I714 Abdominal aortic aneurysm, without rupture, unspecified: Secondary | ICD-10-CM | POA: Diagnosis not present

## 2021-02-28 DIAGNOSIS — G629 Polyneuropathy, unspecified: Secondary | ICD-10-CM | POA: Insufficient documentation

## 2021-02-28 DIAGNOSIS — Z5111 Encounter for antineoplastic chemotherapy: Secondary | ICD-10-CM | POA: Insufficient documentation

## 2021-02-28 DIAGNOSIS — Z95828 Presence of other vascular implants and grafts: Secondary | ICD-10-CM

## 2021-02-28 DIAGNOSIS — G6289 Other specified polyneuropathies: Secondary | ICD-10-CM

## 2021-02-28 DIAGNOSIS — C3491 Malignant neoplasm of unspecified part of right bronchus or lung: Secondary | ICD-10-CM

## 2021-02-28 DIAGNOSIS — I10 Essential (primary) hypertension: Secondary | ICD-10-CM | POA: Insufficient documentation

## 2021-02-28 DIAGNOSIS — Z5189 Encounter for other specified aftercare: Secondary | ICD-10-CM | POA: Insufficient documentation

## 2021-02-28 DIAGNOSIS — Z86718 Personal history of other venous thrombosis and embolism: Secondary | ICD-10-CM | POA: Diagnosis not present

## 2021-02-28 DIAGNOSIS — G4730 Sleep apnea, unspecified: Secondary | ICD-10-CM | POA: Diagnosis not present

## 2021-02-28 DIAGNOSIS — F17210 Nicotine dependence, cigarettes, uncomplicated: Secondary | ICD-10-CM | POA: Diagnosis not present

## 2021-02-28 DIAGNOSIS — Z79899 Other long term (current) drug therapy: Secondary | ICD-10-CM | POA: Diagnosis not present

## 2021-02-28 DIAGNOSIS — E876 Hypokalemia: Secondary | ICD-10-CM | POA: Diagnosis not present

## 2021-02-28 DIAGNOSIS — J449 Chronic obstructive pulmonary disease, unspecified: Secondary | ICD-10-CM | POA: Insufficient documentation

## 2021-02-28 DIAGNOSIS — C782 Secondary malignant neoplasm of pleura: Secondary | ICD-10-CM | POA: Insufficient documentation

## 2021-02-28 LAB — CBC WITH DIFFERENTIAL (CANCER CENTER ONLY)
Abs Immature Granulocytes: 0.11 10*3/uL — ABNORMAL HIGH (ref 0.00–0.07)
Basophils Absolute: 0 10*3/uL (ref 0.0–0.1)
Basophils Relative: 0 %
Eosinophils Absolute: 0 10*3/uL (ref 0.0–0.5)
Eosinophils Relative: 0 %
HCT: 31.4 % — ABNORMAL LOW (ref 36.0–46.0)
Hemoglobin: 10.3 g/dL — ABNORMAL LOW (ref 12.0–15.0)
Immature Granulocytes: 1 %
Lymphocytes Relative: 10 %
Lymphs Abs: 1.1 10*3/uL (ref 0.7–4.0)
MCH: 33.3 pg (ref 26.0–34.0)
MCHC: 32.8 g/dL (ref 30.0–36.0)
MCV: 101.6 fL — ABNORMAL HIGH (ref 80.0–100.0)
Monocytes Absolute: 0.3 10*3/uL (ref 0.1–1.0)
Monocytes Relative: 3 %
Neutro Abs: 10 10*3/uL — ABNORMAL HIGH (ref 1.7–7.7)
Neutrophils Relative %: 86 %
Platelet Count: 215 10*3/uL (ref 150–400)
RBC: 3.09 MIL/uL — ABNORMAL LOW (ref 3.87–5.11)
RDW: 18.4 % — ABNORMAL HIGH (ref 11.5–15.5)
WBC Count: 11.5 10*3/uL — ABNORMAL HIGH (ref 4.0–10.5)
nRBC: 0 % (ref 0.0–0.2)

## 2021-02-28 LAB — CMP (CANCER CENTER ONLY)
ALT: 6 U/L (ref 0–44)
AST: 15 U/L (ref 15–41)
Albumin: 3.6 g/dL (ref 3.5–5.0)
Alkaline Phosphatase: 65 U/L (ref 38–126)
Anion gap: 11 (ref 5–15)
BUN: 23 mg/dL (ref 8–23)
CO2: 26 mmol/L (ref 22–32)
Calcium: 9.3 mg/dL (ref 8.9–10.3)
Chloride: 102 mmol/L (ref 98–111)
Creatinine: 0.83 mg/dL (ref 0.44–1.00)
GFR, Estimated: >60 mL/min (ref >60)
Glucose, Bld: 119 mg/dL — ABNORMAL HIGH (ref 70–99)
Potassium: 3.6 mmol/L (ref 3.5–5.1)
Sodium: 139 mmol/L (ref 135–145)
Total Bilirubin: 0.7 mg/dL (ref 0.3–1.2)
Total Protein: 6.5 g/dL (ref 6.5–8.1)

## 2021-02-28 LAB — TOTAL PROTEIN, URINE DIPSTICK: Protein, ur: NEGATIVE mg/dL

## 2021-02-28 MED ORDER — SODIUM CHLORIDE 0.9 % IV SOLN: Freq: Once | INTRAVENOUS | Status: AC

## 2021-02-28 MED ORDER — SODIUM CHLORIDE 0.9 % IV SOLN
65.0000 mg/m2 | Freq: Once | INTRAVENOUS | Status: AC
  Administered 2021-02-28: 110 mg via INTRAVENOUS
  Filled 2021-02-28: qty 11

## 2021-02-28 MED ORDER — SODIUM CHLORIDE 0.9% FLUSH
10.0000 mL | INTRAVENOUS | Status: DC | PRN
  Administered 2021-02-28: 10 mL

## 2021-02-28 MED ORDER — HEPARIN SOD (PORK) LOCK FLUSH 100 UNIT/ML IV SOLN
500.0000 [IU] | Freq: Once | INTRAVENOUS | Status: AC | PRN
  Administered 2021-02-28: 500 [IU]

## 2021-02-28 MED ORDER — SODIUM CHLORIDE 0.9 % IV SOLN
10.0000 mg | Freq: Once | INTRAVENOUS | Status: AC
  Administered 2021-02-28: 10 mg via INTRAVENOUS
  Filled 2021-02-28: qty 10

## 2021-02-28 MED ORDER — GABAPENTIN 100 MG PO CAPS: 100.0000 mg | ORAL_CAPSULE | Freq: Two times a day (BID) | ORAL | 2 refills | Status: DC

## 2021-02-28 MED ORDER — SODIUM CHLORIDE 0.9 % IV SOLN
10.0000 mg/kg | Freq: Once | INTRAVENOUS | Status: AC
  Administered 2021-02-28: 600 mg via INTRAVENOUS
  Filled 2021-02-28: qty 50

## 2021-02-28 MED ORDER — DIPHENHYDRAMINE HCL 50 MG/ML IJ SOLN
50.0000 mg | Freq: Once | INTRAMUSCULAR | Status: AC
Start: 2021-02-28 — End: 2021-02-28
  Administered 2021-02-28: 50 mg via INTRAVENOUS
  Filled 2021-02-28: qty 1

## 2021-02-28 MED ORDER — ACETAMINOPHEN 325 MG PO TABS
650.0000 mg | ORAL_TABLET | Freq: Once | ORAL | Status: AC
  Administered 2021-02-28: 650 mg via ORAL
  Filled 2021-02-28: qty 2

## 2021-02-28 NOTE — Patient Instructions (Signed)
[facility] ONCOLOGY  Discharge Instructions: Thank you for choosing [facility] to provide your oncology and hematology care.   If you have a lab appointment with the [facility], please go directly to the [facility] and check in at the registration area.   Wear comfortable clothing and clothing appropriate for easy access to any Portacath or PICC line.   We strive to give you quality time with your provider. You may need to reschedule your appointment if you arrive late (15 or more minutes).  Arriving late affects you and other patients whose appointments are after yours.  Also, if you miss three or more appointments without notifying the office, you may be dismissed from the clinic at the provider's discretion.      For prescription refill requests, have your pharmacy contact our office and allow 72 hours for refills to be completed.    Today you received the following chemotherapy and/or immunotherapy agents Cyramza and taxotere      To help prevent nausea and vomiting after your treatment, we encourage you to take your nausea medication as directed.  BELOW ARE SYMPTOMS THAT SHOULD BE REPORTED IMMEDIATELY: *FEVER GREATER THAN 100.4 F (38 C) OR HIGHER *CHILLS OR SWEATING *NAUSEA AND VOMITING THAT IS NOT CONTROLLED WITH YOUR NAUSEA MEDICATION *UNUSUAL SHORTNESS OF BREATH *UNUSUAL BRUISING OR BLEEDING *URINARY PROBLEMS (pain or burning when urinating, or frequent urination) *BOWEL PROBLEMS (unusual diarrhea, constipation, pain near the anus) TENDERNESS IN MOUTH AND THROAT WITH OR WITHOUT PRESENCE OF ULCERS (sore throat, sores in mouth, or a toothache) UNUSUAL RASH, SWELLING OR PAIN  UNUSUAL VAGINAL DISCHARGE OR ITCHING   Items with * indicate a potential emergency and should be followed up as soon as possible or go to the Emergency Department if any problems should occur.  Please show the CHEMOTHERAPY ALERT CARD or IMMUNOTHERAPY ALERT CARD at  check-in to the Emergency Department and triage nurse.  Should you have questions after your visit or need to cancel or reschedule your appointment, please contact [facility]  Dept: [phone]  and follow the prompts.  Office hours are 8:00 a.m. to 4:30 p.m. Monday - Friday. Please note that voicemails left after 4:00 p.m. may not be returned until the following business day.  We are closed weekends and major holidays. You have access to a nurse at all times for urgent questions. Please call the main number to the clinic Dept: [phone] and follow the prompts.   For any non-urgent questions, you may also contact your provider using MyChart. We now offer e-Visits for anyone 71 and older to request care online for non-urgent symptoms. For details visit [email or web address].   Also download the MyChart app! Go to the app store, search "MyChart", open the app, select [facility], and log in with your MyChart username and password.  Due to Covid, a mask is required upon entering the hospital/clinic. If you do not have a mask, one will be given to you upon arrival. For doctor visits, patients may have 1 support person aged 21 or older with them. For treatment visits, patients cannot have anyone with them due to current Covid guidelines and our immunocompromised population.

## 2021-03-02 ENCOUNTER — Inpatient Hospital Stay: Payer: Medicare HMO

## 2021-03-02 ENCOUNTER — Other Ambulatory Visit: Payer: Self-pay

## 2021-03-02 VITALS — BP 131/90 | HR 87 | Temp 98.7°F | Resp 16

## 2021-03-02 DIAGNOSIS — C3491 Malignant neoplasm of unspecified part of right bronchus or lung: Secondary | ICD-10-CM

## 2021-03-02 DIAGNOSIS — Z5111 Encounter for antineoplastic chemotherapy: Secondary | ICD-10-CM | POA: Diagnosis not present

## 2021-03-02 DIAGNOSIS — E876 Hypokalemia: Secondary | ICD-10-CM | POA: Diagnosis not present

## 2021-03-02 DIAGNOSIS — J449 Chronic obstructive pulmonary disease, unspecified: Secondary | ICD-10-CM | POA: Diagnosis not present

## 2021-03-02 DIAGNOSIS — I714 Abdominal aortic aneurysm, without rupture, unspecified: Secondary | ICD-10-CM | POA: Diagnosis not present

## 2021-03-02 DIAGNOSIS — C782 Secondary malignant neoplasm of pleura: Secondary | ICD-10-CM | POA: Diagnosis not present

## 2021-03-02 DIAGNOSIS — I10 Essential (primary) hypertension: Secondary | ICD-10-CM | POA: Diagnosis not present

## 2021-03-02 DIAGNOSIS — Z5189 Encounter for other specified aftercare: Secondary | ICD-10-CM | POA: Diagnosis not present

## 2021-03-02 DIAGNOSIS — G629 Polyneuropathy, unspecified: Secondary | ICD-10-CM | POA: Diagnosis not present

## 2021-03-02 DIAGNOSIS — C3411 Malignant neoplasm of upper lobe, right bronchus or lung: Secondary | ICD-10-CM | POA: Diagnosis not present

## 2021-03-02 MED ORDER — PEGFILGRASTIM-JMDB 6 MG/0.6ML ~~LOC~~ SOSY
6.0000 mg | PREFILLED_SYRINGE | Freq: Once | SUBCUTANEOUS | Status: AC
  Administered 2021-03-02: 6 mg via SUBCUTANEOUS
  Filled 2021-03-02: qty 0.6

## 2021-03-02 NOTE — Patient Instructions (Signed)

## 2021-03-07 ENCOUNTER — Other Ambulatory Visit: Payer: Self-pay

## 2021-03-07 ENCOUNTER — Inpatient Hospital Stay: Payer: Medicare HMO

## 2021-03-07 ENCOUNTER — Other Ambulatory Visit: Payer: Self-pay | Admitting: Physician Assistant

## 2021-03-07 DIAGNOSIS — C3491 Malignant neoplasm of unspecified part of right bronchus or lung: Secondary | ICD-10-CM

## 2021-03-07 DIAGNOSIS — C3411 Malignant neoplasm of upper lobe, right bronchus or lung: Secondary | ICD-10-CM | POA: Diagnosis not present

## 2021-03-07 DIAGNOSIS — C782 Secondary malignant neoplasm of pleura: Secondary | ICD-10-CM | POA: Diagnosis not present

## 2021-03-07 DIAGNOSIS — Z5189 Encounter for other specified aftercare: Secondary | ICD-10-CM | POA: Diagnosis not present

## 2021-03-07 DIAGNOSIS — I714 Abdominal aortic aneurysm, without rupture, unspecified: Secondary | ICD-10-CM | POA: Diagnosis not present

## 2021-03-07 DIAGNOSIS — E876 Hypokalemia: Secondary | ICD-10-CM | POA: Diagnosis not present

## 2021-03-07 DIAGNOSIS — Z95828 Presence of other vascular implants and grafts: Secondary | ICD-10-CM

## 2021-03-07 DIAGNOSIS — J449 Chronic obstructive pulmonary disease, unspecified: Secondary | ICD-10-CM | POA: Diagnosis not present

## 2021-03-07 DIAGNOSIS — I10 Essential (primary) hypertension: Secondary | ICD-10-CM | POA: Diagnosis not present

## 2021-03-07 DIAGNOSIS — Z5111 Encounter for antineoplastic chemotherapy: Secondary | ICD-10-CM | POA: Diagnosis not present

## 2021-03-07 DIAGNOSIS — G629 Polyneuropathy, unspecified: Secondary | ICD-10-CM | POA: Diagnosis not present

## 2021-03-07 LAB — CBC WITH DIFFERENTIAL (CANCER CENTER ONLY)
Abs Immature Granulocytes: 0.27 10*3/uL — ABNORMAL HIGH (ref 0.00–0.07)
Basophils Absolute: 0.1 10*3/uL (ref 0.0–0.1)
Basophils Relative: 2 %
Eosinophils Absolute: 0 10*3/uL (ref 0.0–0.5)
Eosinophils Relative: 0 %
HCT: 30.9 % — ABNORMAL LOW (ref 36.0–46.0)
Hemoglobin: 10.2 g/dL — ABNORMAL LOW (ref 12.0–15.0)
Immature Granulocytes: 12 %
Lymphocytes Relative: 55 %
Lymphs Abs: 1.3 10*3/uL (ref 0.7–4.0)
MCH: 33.3 pg (ref 26.0–34.0)
MCHC: 33 g/dL (ref 30.0–36.0)
MCV: 101 fL — ABNORMAL HIGH (ref 80.0–100.0)
Monocytes Absolute: 0.2 10*3/uL (ref 0.1–1.0)
Monocytes Relative: 7 %
Neutro Abs: 0.6 10*3/uL — ABNORMAL LOW (ref 1.7–7.7)
Neutrophils Relative %: 24 %
Platelet Count: 92 10*3/uL — ABNORMAL LOW (ref 150–400)
RBC: 3.06 MIL/uL — ABNORMAL LOW (ref 3.87–5.11)
RDW: 17.9 % — ABNORMAL HIGH (ref 11.5–15.5)
Smear Review: NORMAL
WBC Count: 2.3 10*3/uL — ABNORMAL LOW (ref 4.0–10.5)
nRBC: 0 % (ref 0.0–0.2)

## 2021-03-07 LAB — CMP (CANCER CENTER ONLY)
ALT: 6 U/L (ref 0–44)
AST: 14 U/L — ABNORMAL LOW (ref 15–41)
Albumin: 3.2 g/dL — ABNORMAL LOW (ref 3.5–5.0)
Alkaline Phosphatase: 62 U/L (ref 38–126)
Anion gap: 8 (ref 5–15)
BUN: 23 mg/dL (ref 8–23)
CO2: 29 mmol/L (ref 22–32)
Calcium: 8.7 mg/dL — ABNORMAL LOW (ref 8.9–10.3)
Chloride: 103 mmol/L (ref 98–111)
Creatinine: 0.71 mg/dL (ref 0.44–1.00)
GFR, Estimated: >60 mL/min (ref >60)
Glucose, Bld: 84 mg/dL (ref 70–99)
Potassium: 3.2 mmol/L — ABNORMAL LOW (ref 3.5–5.1)
Sodium: 140 mmol/L (ref 135–145)
Total Bilirubin: 1.5 mg/dL — ABNORMAL HIGH (ref 0.3–1.2)
Total Protein: 6 g/dL — ABNORMAL LOW (ref 6.5–8.1)

## 2021-03-07 MED ORDER — SODIUM CHLORIDE 0.9% FLUSH
10.0000 mL | INTRAVENOUS | Status: DC | PRN
  Administered 2021-03-07: 11:00:00 10 mL

## 2021-03-07 MED ORDER — POTASSIUM CHLORIDE CRYS ER 20 MEQ PO TBCR: 20.0000 meq | EXTENDED_RELEASE_TABLET | Freq: Every day | ORAL | 0 refills | Status: DC

## 2021-03-07 MED ORDER — HEPARIN SOD (PORK) LOCK FLUSH 100 UNIT/ML IV SOLN
500.0000 [IU] | Freq: Once | INTRAVENOUS | Status: AC | PRN
  Administered 2021-03-07: 11:00:00 500 [IU]

## 2021-03-13 ENCOUNTER — Telehealth: Payer: Self-pay

## 2021-03-13 NOTE — Telephone Encounter (Signed)
Called pt - stated she was started on a new inhaler in November and has not received it. Stated she had called [facility] and they did not have it. Informed pt rx was sent to the mail order pharmacy and to call them. And if she has any problems, to give the office a call back,.

## 2021-03-13 NOTE — Telephone Encounter (Signed)
Requesting to speak with a nurse about inhaler. Please call pt back.

## 2021-03-14 ENCOUNTER — Inpatient Hospital Stay: Payer: Medicare HMO

## 2021-03-14 ENCOUNTER — Other Ambulatory Visit: Payer: Self-pay | Admitting: Physician Assistant

## 2021-03-14 ENCOUNTER — Other Ambulatory Visit: Payer: Self-pay

## 2021-03-14 DIAGNOSIS — C782 Secondary malignant neoplasm of pleura: Secondary | ICD-10-CM | POA: Diagnosis not present

## 2021-03-14 DIAGNOSIS — I714 Abdominal aortic aneurysm, without rupture, unspecified: Secondary | ICD-10-CM | POA: Diagnosis not present

## 2021-03-14 DIAGNOSIS — Z95828 Presence of other vascular implants and grafts: Secondary | ICD-10-CM

## 2021-03-14 DIAGNOSIS — J449 Chronic obstructive pulmonary disease, unspecified: Secondary | ICD-10-CM | POA: Diagnosis not present

## 2021-03-14 DIAGNOSIS — I10 Essential (primary) hypertension: Secondary | ICD-10-CM | POA: Diagnosis not present

## 2021-03-14 DIAGNOSIS — C3491 Malignant neoplasm of unspecified part of right bronchus or lung: Secondary | ICD-10-CM

## 2021-03-14 DIAGNOSIS — Z5189 Encounter for other specified aftercare: Secondary | ICD-10-CM | POA: Diagnosis not present

## 2021-03-14 DIAGNOSIS — C3411 Malignant neoplasm of upper lobe, right bronchus or lung: Secondary | ICD-10-CM | POA: Diagnosis not present

## 2021-03-14 DIAGNOSIS — E876 Hypokalemia: Secondary | ICD-10-CM | POA: Diagnosis not present

## 2021-03-14 DIAGNOSIS — Z5111 Encounter for antineoplastic chemotherapy: Secondary | ICD-10-CM | POA: Diagnosis not present

## 2021-03-14 DIAGNOSIS — G629 Polyneuropathy, unspecified: Secondary | ICD-10-CM | POA: Diagnosis not present

## 2021-03-14 LAB — CMP (CANCER CENTER ONLY)
ALT: 5 U/L (ref 0–44)
AST: 12 U/L — ABNORMAL LOW (ref 15–41)
Albumin: 3.5 g/dL (ref 3.5–5.0)
Alkaline Phosphatase: 79 U/L (ref 38–126)
Anion gap: 5 (ref 5–15)
BUN: 28 mg/dL — ABNORMAL HIGH (ref 8–23)
CO2: 32 mmol/L (ref 22–32)
Calcium: 8.9 mg/dL (ref 8.9–10.3)
Chloride: 103 mmol/L (ref 98–111)
Creatinine: 0.8 mg/dL (ref 0.44–1.00)
GFR, Estimated: >60 mL/min (ref >60)
Glucose, Bld: 88 mg/dL (ref 70–99)
Potassium: 3.6 mmol/L (ref 3.5–5.1)
Sodium: 140 mmol/L (ref 135–145)
Total Bilirubin: 0.7 mg/dL (ref 0.3–1.2)
Total Protein: 6.1 g/dL — ABNORMAL LOW (ref 6.5–8.1)

## 2021-03-14 LAB — CBC WITH DIFFERENTIAL (CANCER CENTER ONLY)
Abs Immature Granulocytes: 0.17 10*3/uL — ABNORMAL HIGH (ref 0.00–0.07)
Basophils Absolute: 0.1 10*3/uL (ref 0.0–0.1)
Basophils Relative: 1 %
Eosinophils Absolute: 0 10*3/uL (ref 0.0–0.5)
Eosinophils Relative: 0 %
HCT: 32.4 % — ABNORMAL LOW (ref 36.0–46.0)
Hemoglobin: 10.6 g/dL — ABNORMAL LOW (ref 12.0–15.0)
Immature Granulocytes: 1 %
Lymphocytes Relative: 17 %
Lymphs Abs: 2.2 10*3/uL (ref 0.7–4.0)
MCH: 33.4 pg (ref 26.0–34.0)
MCHC: 32.7 g/dL (ref 30.0–36.0)
MCV: 102.2 fL — ABNORMAL HIGH (ref 80.0–100.0)
Monocytes Absolute: 0.4 10*3/uL (ref 0.1–1.0)
Monocytes Relative: 3 %
Neutro Abs: 10 10*3/uL — ABNORMAL HIGH (ref 1.7–7.7)
Neutrophils Relative %: 78 %
Platelet Count: 125 10*3/uL — ABNORMAL LOW (ref 150–400)
RBC: 3.17 MIL/uL — ABNORMAL LOW (ref 3.87–5.11)
RDW: 18.3 % — ABNORMAL HIGH (ref 11.5–15.5)
WBC Count: 12.8 10*3/uL — ABNORMAL HIGH (ref 4.0–10.5)
nRBC: 0.3 % — ABNORMAL HIGH (ref 0.0–0.2)

## 2021-03-14 MED ORDER — LIDOCAINE-PRILOCAINE 2.5-2.5 % EX CREA: 1.0000 "application " | TOPICAL_CREAM | CUTANEOUS | 1 refills | Status: DC | PRN

## 2021-03-14 MED ORDER — HEPARIN SOD (PORK) LOCK FLUSH 100 UNIT/ML IV SOLN: 500.0000 [IU] | Freq: Once | INTRAVENOUS | Status: DC | PRN

## 2021-03-14 MED ORDER — SODIUM CHLORIDE 0.9% FLUSH
10.0000 mL | INTRAVENOUS | Status: DC | PRN
  Administered 2021-03-14: 11:00:00 10 mL

## 2021-03-16 ENCOUNTER — Ambulatory Visit (HOSPITAL_COMMUNITY)
Admission: RE | Admit: 2021-03-16 | Discharge: 2021-03-16 | Disposition: A | Discharge status: Home / Self Care | Payer: Medicare HMO | Source: Ambulatory Visit | Attending: Physician Assistant | Admitting: Physician Assistant

## 2021-03-16 ENCOUNTER — Encounter (HOSPITAL_COMMUNITY): Payer: Self-pay

## 2021-03-16 ENCOUNTER — Other Ambulatory Visit: Payer: Self-pay

## 2021-03-16 DIAGNOSIS — I7123 Aneurysm of the descending thoracic aorta, without rupture: Secondary | ICD-10-CM | POA: Diagnosis not present

## 2021-03-16 DIAGNOSIS — C3491 Malignant neoplasm of unspecified part of right bronchus or lung: Secondary | ICD-10-CM | POA: Diagnosis not present

## 2021-03-16 DIAGNOSIS — J90 Pleural effusion, not elsewhere classified: Secondary | ICD-10-CM | POA: Diagnosis not present

## 2021-03-16 DIAGNOSIS — J439 Emphysema, unspecified: Secondary | ICD-10-CM | POA: Diagnosis not present

## 2021-03-16 DIAGNOSIS — K5730 Diverticulosis of large intestine without perforation or abscess without bleeding: Secondary | ICD-10-CM | POA: Diagnosis not present

## 2021-03-16 DIAGNOSIS — C3490 Malignant neoplasm of unspecified part of unspecified bronchus or lung: Secondary | ICD-10-CM | POA: Diagnosis not present

## 2021-03-16 DIAGNOSIS — J9811 Atelectasis: Secondary | ICD-10-CM | POA: Diagnosis not present

## 2021-03-16 MED ORDER — IOHEXOL 9 MG/ML PO SOLN: 1000.0000 mL | ORAL | Status: AC

## 2021-03-16 MED ORDER — IOHEXOL 9 MG/ML PO SOLN
ORAL | Status: AC
  Administered 2021-03-16: 07:00:00 1000 mL via ORAL
  Filled 2021-03-16: qty 1000

## 2021-03-16 MED ORDER — IOHEXOL 350 MG/ML SOLN
75.0000 mL | Freq: Once | INTRAVENOUS | Status: AC | PRN
  Administered 2021-03-16: 10:00:00 75 mL via INTRAVENOUS

## 2021-03-16 MED ORDER — SODIUM CHLORIDE (PF) 0.9 % IJ SOLN
INTRAMUSCULAR | Status: AC
  Filled 2021-03-16: qty 50

## 2021-03-18 ENCOUNTER — Other Ambulatory Visit: Payer: Self-pay | Admitting: Internal Medicine

## 2021-03-20 ENCOUNTER — Telehealth: Payer: Self-pay | Admitting: Physician Assistant

## 2021-03-20 NOTE — Telephone Encounter (Signed)
I called the patient to review her scan. I reviewed the results with Dr. Julien Nordmann. Her scan did not show disease progression. Dr. Julien Nordmann recommends that she continue on the same treatment. She expressed understanding. The scan noted incidental and stable findings with her left adnexal cyst and stable lower thoracic and upper abdominal aortic aneurysms. We will continue to monitor these on future restaging imaging studies. She had billing questions. I advised her to ask for the point of contact in the billing department tomorrow when she comes into her appointment at the registration desk.

## 2021-03-21 ENCOUNTER — Inpatient Hospital Stay: Payer: Medicare HMO

## 2021-03-21 ENCOUNTER — Other Ambulatory Visit: Payer: Self-pay

## 2021-03-21 ENCOUNTER — Inpatient Hospital Stay (HOSPITAL_BASED_OUTPATIENT_CLINIC_OR_DEPARTMENT_OTHER): Payer: Medicare HMO | Admitting: Physician Assistant

## 2021-03-21 ENCOUNTER — Other Ambulatory Visit: Payer: Medicare HMO

## 2021-03-21 ENCOUNTER — Other Ambulatory Visit: Payer: Self-pay | Admitting: Physician Assistant

## 2021-03-21 ENCOUNTER — Ambulatory Visit: Payer: Medicare HMO | Admitting: Physician Assistant

## 2021-03-21 DIAGNOSIS — C782 Secondary malignant neoplasm of pleura: Secondary | ICD-10-CM | POA: Diagnosis not present

## 2021-03-21 DIAGNOSIS — C3491 Malignant neoplasm of unspecified part of right bronchus or lung: Secondary | ICD-10-CM

## 2021-03-21 DIAGNOSIS — Z5189 Encounter for other specified aftercare: Secondary | ICD-10-CM | POA: Diagnosis not present

## 2021-03-21 DIAGNOSIS — Z5111 Encounter for antineoplastic chemotherapy: Secondary | ICD-10-CM | POA: Diagnosis not present

## 2021-03-21 DIAGNOSIS — E876 Hypokalemia: Secondary | ICD-10-CM | POA: Diagnosis not present

## 2021-03-21 DIAGNOSIS — C3411 Malignant neoplasm of upper lobe, right bronchus or lung: Secondary | ICD-10-CM | POA: Diagnosis not present

## 2021-03-21 DIAGNOSIS — Z95828 Presence of other vascular implants and grafts: Secondary | ICD-10-CM

## 2021-03-21 DIAGNOSIS — G629 Polyneuropathy, unspecified: Secondary | ICD-10-CM | POA: Diagnosis not present

## 2021-03-21 DIAGNOSIS — J449 Chronic obstructive pulmonary disease, unspecified: Secondary | ICD-10-CM | POA: Diagnosis not present

## 2021-03-21 DIAGNOSIS — I10 Essential (primary) hypertension: Secondary | ICD-10-CM | POA: Diagnosis not present

## 2021-03-21 DIAGNOSIS — I714 Abdominal aortic aneurysm, without rupture, unspecified: Secondary | ICD-10-CM | POA: Diagnosis not present

## 2021-03-21 LAB — CBC WITH DIFFERENTIAL (CANCER CENTER ONLY)
Abs Immature Granulocytes: 0.32 10*3/uL — ABNORMAL HIGH (ref 0.00–0.07)
Basophils Absolute: 0.1 10*3/uL (ref 0.0–0.1)
Basophils Relative: 0 %
Eosinophils Absolute: 0 10*3/uL (ref 0.0–0.5)
Eosinophils Relative: 0 %
HCT: 32.2 % — ABNORMAL LOW (ref 36.0–46.0)
Hemoglobin: 10.6 g/dL — ABNORMAL LOW (ref 12.0–15.0)
Immature Granulocytes: 1 %
Lymphocytes Relative: 5 %
Lymphs Abs: 1.1 10*3/uL (ref 0.7–4.0)
MCH: 33.5 pg (ref 26.0–34.0)
MCHC: 32.9 g/dL (ref 30.0–36.0)
MCV: 101.9 fL — ABNORMAL HIGH (ref 80.0–100.0)
Monocytes Absolute: 0.4 10*3/uL (ref 0.1–1.0)
Monocytes Relative: 2 %
Neutro Abs: 21.1 10*3/uL — ABNORMAL HIGH (ref 1.7–7.7)
Neutrophils Relative %: 92 %
Platelet Count: 201 10*3/uL (ref 150–400)
RBC: 3.16 MIL/uL — ABNORMAL LOW (ref 3.87–5.11)
RDW: 18.4 % — ABNORMAL HIGH (ref 11.5–15.5)
WBC Count: 23 10*3/uL — ABNORMAL HIGH (ref 4.0–10.5)
nRBC: 0 % (ref 0.0–0.2)

## 2021-03-21 LAB — CMP (CANCER CENTER ONLY)
ALT: 7 U/L (ref 0–44)
AST: 15 U/L (ref 15–41)
Albumin: 3.8 g/dL (ref 3.5–5.0)
Alkaline Phosphatase: 58 U/L (ref 38–126)
Anion gap: 10 (ref 5–15)
BUN: 28 mg/dL — ABNORMAL HIGH (ref 8–23)
CO2: 29 mmol/L (ref 22–32)
Calcium: 9.5 mg/dL (ref 8.9–10.3)
Chloride: 99 mmol/L (ref 98–111)
Creatinine: 0.91 mg/dL (ref 0.44–1.00)
GFR, Estimated: >60 mL/min (ref >60)
Glucose, Bld: 115 mg/dL — ABNORMAL HIGH (ref 70–99)
Potassium: 3.1 mmol/L — ABNORMAL LOW (ref 3.5–5.1)
Sodium: 138 mmol/L (ref 135–145)
Total Bilirubin: 0.6 mg/dL (ref 0.3–1.2)
Total Protein: 6.7 g/dL (ref 6.5–8.1)

## 2021-03-21 LAB — MAGNESIUM: Magnesium: 1.5 mg/dL — ABNORMAL LOW (ref 1.7–2.4)

## 2021-03-21 LAB — TOTAL PROTEIN, URINE DIPSTICK

## 2021-03-21 MED ORDER — POTASSIUM CHLORIDE CRYS ER 20 MEQ PO TBCR: 20.0000 meq | EXTENDED_RELEASE_TABLET | Freq: Every day | ORAL | 0 refills | Status: DC

## 2021-03-21 MED ORDER — LIDOCAINE-PRILOCAINE 2.5-2.5 % EX CREA: 1.0000 "application " | TOPICAL_CREAM | CUTANEOUS | 1 refills | Status: AC | PRN

## 2021-03-21 MED ORDER — SODIUM CHLORIDE 0.9 % IV SOLN
10.0000 mg | Freq: Once | INTRAVENOUS | Status: AC
  Administered 2021-03-21: 10:00:00 10 mg via INTRAVENOUS
  Filled 2021-03-21: qty 10

## 2021-03-21 MED ORDER — ACETAMINOPHEN 325 MG PO TABS
650.0000 mg | ORAL_TABLET | Freq: Once | ORAL | Status: AC
  Administered 2021-03-21: 10:00:00 650 mg via ORAL
  Filled 2021-03-21: qty 2

## 2021-03-21 MED ORDER — HEPARIN SOD (PORK) LOCK FLUSH 100 UNIT/ML IV SOLN
500.0000 [IU] | Freq: Once | INTRAVENOUS | Status: AC | PRN
  Administered 2021-03-21: 12:00:00 500 [IU]

## 2021-03-21 MED ORDER — SODIUM CHLORIDE 0.9 % IV SOLN
Freq: Once | INTRAVENOUS | Status: AC
Start: 2021-03-21 — End: 2021-03-21

## 2021-03-21 MED ORDER — SODIUM CHLORIDE 0.9 % IV SOLN
65.0000 mg/m2 | Freq: Once | INTRAVENOUS | Status: AC
  Administered 2021-03-21: 11:00:00 110 mg via INTRAVENOUS
  Filled 2021-03-21: qty 11

## 2021-03-21 MED ORDER — SODIUM CHLORIDE 0.9% FLUSH
10.0000 mL | INTRAVENOUS | Status: DC | PRN
  Administered 2021-03-21: 12:00:00 10 mL

## 2021-03-21 MED ORDER — MAGNESIUM OXIDE -MG SUPPLEMENT 400 (240 MG) MG PO TABS: 400.0000 mg | ORAL_TABLET | Freq: Every day | ORAL | 2 refills | Status: DC

## 2021-03-21 MED ORDER — SODIUM CHLORIDE 0.9% FLUSH
10.0000 mL | INTRAVENOUS | Status: DC | PRN
  Administered 2021-03-21: 08:00:00 10 mL

## 2021-03-21 MED ORDER — SODIUM CHLORIDE 0.9 % IV SOLN
10.0000 mg/kg | Freq: Once | INTRAVENOUS | Status: AC
  Administered 2021-03-21: 11:00:00 600 mg via INTRAVENOUS
  Filled 2021-03-21: qty 50

## 2021-03-21 MED ORDER — DIPHENHYDRAMINE HCL 50 MG/ML IJ SOLN
50.0000 mg | Freq: Once | INTRAMUSCULAR | Status: AC
  Administered 2021-03-21: 10:00:00 50 mg via INTRAVENOUS
  Filled 2021-03-21: qty 1

## 2021-03-21 MED ORDER — POTASSIUM CHLORIDE CRYS ER 20 MEQ PO TBCR
40.0000 meq | EXTENDED_RELEASE_TABLET | Freq: Once | ORAL | Status: AC
  Administered 2021-03-21: 10:00:00 40 meq via ORAL
  Filled 2021-03-21: qty 2

## 2021-03-21 NOTE — Patient Instructions (Signed)
[facility] ONCOLOGY  Discharge Instructions: Thank you for choosing [facility] to provide your oncology and hematology care.   If you have a lab appointment with the [facility], please go directly to the [facility] and check in at the registration area.   Wear comfortable clothing and clothing appropriate for easy access to any Portacath or PICC line.   We strive to give you quality time with your provider. You may need to reschedule your appointment if you arrive late (15 or more minutes).  Arriving late affects you and other patients whose appointments are after yours.  Also, if you miss three or more appointments without notifying the office, you may be dismissed from the clinic at the providers discretion.      For prescription refill requests, have your pharmacy contact our office and allow 72 hours for refills to be completed.    Today you received the following chemotherapy and/or immunotherapy agents Cyramza and taxotere      To help prevent nausea and vomiting after your treatment, we encourage you to take your nausea medication as directed.  BELOW ARE SYMPTOMS THAT SHOULD BE REPORTED IMMEDIATELY: *FEVER GREATER THAN 100.4 F (38 C) OR HIGHER *CHILLS OR SWEATING *NAUSEA AND VOMITING THAT IS NOT CONTROLLED WITH YOUR NAUSEA MEDICATION *UNUSUAL SHORTNESS OF BREATH *UNUSUAL BRUISING OR BLEEDING *URINARY PROBLEMS (pain or burning when urinating, or frequent urination) *BOWEL PROBLEMS (unusual diarrhea, constipation, pain near the anus) TENDERNESS IN MOUTH AND THROAT WITH OR WITHOUT PRESENCE OF ULCERS (sore throat, sores in mouth, or a toothache) UNUSUAL RASH, SWELLING OR PAIN  UNUSUAL VAGINAL DISCHARGE OR ITCHING   Items with * indicate a potential emergency and should be followed up as soon as possible or go to the Emergency Department if any problems should occur.  Please show the CHEMOTHERAPY ALERT CARD or IMMUNOTHERAPY ALERT CARD at  check-in to the Emergency Department and triage nurse.  Should you have questions after your visit or need to cancel or reschedule your appointment, please contact [facility]  Dept: [phone]  and follow the prompts.  Office hours are 8:00 a.m. to 4:30 p.m. Monday - Friday. Please note that voicemails left after 4:00 p.m. may not be returned until the following business day.  We are closed weekends and major holidays. You have access to a nurse at all times for urgent questions. Please call the main number to the clinic Dept: [phone] and follow the prompts.   For any non-urgent questions, you may also contact your provider using MyChart. We now offer e-Visits for anyone 89 and older to request care online for non-urgent symptoms. For details visit [email or web address].   Also download the MyChart app! Go to the app store, search "MyChart", open the app, select [facility], and log in with your MyChart username and password.  Due to Covid, a mask is required upon entering the hospital/clinic. If you do not have a mask, one will be given to you upon arrival. For doctor visits, patients may have 1 support person aged 3 or older with them. For treatment visits, patients cannot have anyone with them due to current Covid guidelines and our immunocompromised population.

## 2021-03-21 NOTE — Progress Notes (Signed)
[facility] OFFICE PROGRESS NOTE  [name], MD [address] [place] [address]  DIAGNOSIS: Stage IV non-small cell lung cancer, squamous cell carcinoma. She presented with right upper lobe lung mass in addition to pleural-based metastasis and mediastinal lymphadenopathy. She was diagnosed in September 2020.   PRIOR THERAPY: Chemotherapy with carboplatin for an AUC of 5, paclitaxel 175 mg/m, and Keytruda 200 mg IV every 3 weeks with Neulasta support. Last dose 12/08/19. Status post 18 cycles.  Starting from cycle #5 was on maintenance treatment with single agent Keytruda every 3 weeks. This was discontinued due to evidence of disease progression.  CURRENT THERAPY:  Palliative systemic chemotherapy with docetaxel 75 mg per metered squared and Cyramza 10 mg/kg IV every 3 weeks with Neulasta support. First dose on 01/04/20. Status post 21 cycles. Her dose of docetaxel was reduced to 65 mg/m2 starting from cycle #10 due to nail changes and peripheral neuropathy.    INTERVAL HISTORY: Debra Rodgers 75 y.o. female returns to the clinic today a follow-up visit.   She reports that her energy levels and appetite have improved in the last couple of weeks.  She uses a cane to ambulate and needs a wheelchair for longer distances.  She denies nausea, vomiting or abdominal pain.  Patient reports that her bowel habits are unchanged and she continues to have intermittent episodes of loose stools that has been present for several years.  She denies easy bruising or signs of active bleeding.  She does have occasional episodes of blood-tinged nasal sputum when she blows her nose.  She continues to have dyspnea on exertion with a chronic cough which is her baseline.  She uses her inhalers as needed and over-the-counter cough suppressants.  She continues to have bilateral lower leg and ankle swelling that is chronic and improves with elevation of her legs.  She reports intermittent  episodes of night sweats that have improved recently.  She has no other complaints.  Rest of the ROS is below.   MEDICAL HISTORY: Past Medical History:  Diagnosis Date   AAA (abdominal aortic aneurysm)    COPD (chronic obstructive pulmonary disease) (HCC)    Depression    DVT (deep venous thrombosis) ([facility]) 07/12/2019   Essential hypertension    Headache    History of migraine headaches    lung ca dx'd 10/2018   Sleep apnea    Tobacco use disorder     ALLERGIES:  is allergic to codeine sulfate and pantoprazole sodium.  MEDICATIONS:  Current Outpatient Medications  Medication Sig Dispense Refill   acetaminophen (TYLENOL) 500 MG tablet Take 500 mg by mouth every 6 (six) hours as needed for moderate pain.     albuterol (VENTOLIN HFA) 108 (90 Base) MCG/ACT inhaler Inhale 2 puffs into the lungs every 6 (six) hours as needed for wheezing or shortness of breath. 4 each 0   amLODipine (NORVASC) 10 MG tablet TAKE 1 TABLET EVERY DAY 90 tablet 3   atorvastatin (LIPITOR) 20 MG tablet TAKE 1 TABLET EVERY DAY 90 tablet 3   chlorthalidone (HYGROTON) 50 MG tablet TAKE 1 TABLET EVERY DAY (Patient taking differently: Take 50 mg by mouth daily.) 90 tablet 3   dexamethasone (DECADRON) 4 MG tablet TAKE 2 TABLETS TWICE DAILY THE DAY BEFORE, THE DAY OF, AND THE DAY AFTER CHEMOTHERAPY 51 tablet 2   famotidine (PEPCID) 20 MG tablet Take 20 mg by mouth daily.      FLUoxetine (PROZAC) 20 MG capsule  TAKE 1 CAPSULE EVERY DAY (Patient taking differently: Take 20 mg by mouth daily.) 90 capsule 3   fluticasone (FLONASE) 50 MCG/ACT nasal spray Place 1 spray into both nostrils daily. 11.1 mL 0   gabapentin (NEURONTIN) 100 MG capsule Take 1 capsule (100 mg total) by mouth 2 (two) times daily. 90 capsule 2   lidocaine-prilocaine (EMLA) cream Apply 1 application topically as needed. 30 g 1   magnesium oxide (MAG-OX) 400 (240 Mg) MG tablet Take 1 tablet (400 mg total) by mouth daily. 30 tablet 2   polyvinyl alcohol  (LIQUIFILM TEARS) 1.4 % ophthalmic solution Place 1 drop into both eyes as needed for dry eyes.     potassium chloride SA (KLOR-CON M) 20 MEQ tablet Take 1 tablet (20 mEq total) by mouth daily. 7 tablet 0   umeclidinium bromide (INCRUSE ELLIPTA) 62.5 MCG/ACT AEPB Inhale 1 puff into the lungs daily. 7 each 2   No current facility-administered medications for this visit.   Facility-Administered Medications Ordered in Other Visits  Medication Dose Route Frequency Provider Last Rate Last Admin   sodium chloride flush (NS) 0.9 % injection 10 mL  10 mL Intracatheter PRN Curt Bears, MD   10 mL at 03/21/21 1217    SURGICAL HISTORY:  Past Surgical History:  Procedure Laterality Date   ABDOMINAL HYSTERECTOMY     CHOLECYSTECTOMY     IR IMAGING GUIDED PORT INSERTION  02/24/2019   ROTATOR CUFF REPAIR  3/04   VIDEO BRONCHOSCOPY WITH ENDOBRONCHIAL ULTRASOUND Right 11/25/2018   Procedure: VIDEO BRONCHOSCOPY WITH ENDOBRONCHIAL ULTRASOUND;  Surgeon: Collene Gobble, MD;  Location: [facility];  Service: Thoracic;  Laterality: Right;    REVIEW OF SYSTEMS:   Constitutional: Positive for fatigue. Negative for weight loss and fever HENT: Negative for nosebleeds, sore throat and trouble swallowing.   Eyes: Positive for subconjunctival hemorrhage in right eye.  Denies visual changes or pain.  Negative for eye problems and icterus.  Respiratory: Positive for baseline cough and baseline dyspnea on exertion. Positive for intermittent wheezing (none at this time). Negative for hemoptysis. Cardiovascular: Negative for chest pain.  Positive for mild bilateral ankle swelling. Gastrointestinal: Positive for baseline intermittent diarrhea (controlled and improved recently). Negative for abdominal pain, constipation, nausea and vomiting. Genitourinary: Negative for bladder incontinence, difficulty urinating, dysuria, frequency and hematuria.   Musculoskeletal: Negative for back pain, gait problem, neck pain and neck  stiffness.  Skin: Positive for nail darkening and falling off.  Negative for itching and rash.  Neurological: Negative for dizziness, headaches, extremity weakness, gait problem, light-headedness and seizures.  Hematological: Negative for adenopathy. Does not bruise/bleed easily.  Psychiatric/Behavioral: Negative for confusion, depression and sleep disturbance. The patient is not nervous/anxious.     PHYSICAL EXAMINATION:  Blood pressure 128/83, pulse 86, temperature 97.9 F (36.6 C), temperature source Temporal, resp. rate 16, height 5' 1"  (1.549 m), weight 138 lb 11.2 oz (62.9 kg), SpO2 96 %.  ECOG PERFORMANCE STATUS: 1  Physical Exam  Constitutional: Oriented to person, place, and time and well-developed, well-nourished, and in no distress.  HENT:  Head: Normocephalic and atraumatic.  Mouth/Throat: Oropharynx is clear and moist. No oropharyngeal exudate.  Eyes: Subconjunctival hemorrhage in right eye. Neck: Normal range of motion. Neck supple.  Cardiovascular: Normal rate, regular rhythm, normal heart sounds and intact distal pulses.   Pulmonary/Chest: Effort normal and breath sounds normal. No respiratory distress. No wheezes. No rales.  Abdominal: Soft. Bowel sounds are normal. Exhibits no distension and no mass. There is no  tenderness.  Musculoskeletal: Normal range of motion.  Mild/minimal bilateral ankle edema. Lymphadenopathy:    No cervical adenopathy.  Neurological: Alert and oriented to person, place, and time. Exhibits normal muscle tone. Gait normal. Coordination normal.  Ambulates with a cane. Skin: Darkening and cracking of finger nails. Skin is warm and dry. No rash noted. Not diaphoretic. No erythema. No pallor.  Psychiatric: Mood, memory and judgment normal.  Vitals reviewed.  LABORATORY DATA: Lab Results  Component Value Date   WBC 23.0 (H) 03/21/2021   HGB 10.6 (L) 03/21/2021   HCT 32.2 (L) 03/21/2021   MCV 101.9 (H) 03/21/2021   PLT 201 03/21/2021       Chemistry      Component Value Date/Time   NA 138 03/21/2021 0816   NA 139 07/21/2017 0958   K 3.1 (L) 03/21/2021 0816   CL 99 03/21/2021 0816   CO2 29 03/21/2021 0816   BUN 28 (H) 03/21/2021 0816   BUN 26 07/21/2017 0958   CREATININE 0.91 03/21/2021 0816   CREATININE 0.79 09/03/2013 1540      Component Value Date/Time   CALCIUM 9.5 03/21/2021 0816   ALKPHOS 58 03/21/2021 0816   AST 15 03/21/2021 0816   ALT 7 03/21/2021 0816   BILITOT 0.6 03/21/2021 0816       RADIOGRAPHIC STUDIES:  CT Chest W Contrast  Result Date: 03/16/2021 CLINICAL DATA:  Metastatic non-small cell lung cancer, chemotherapy in progress, for restaging EXAM: CT CHEST, ABDOMEN, AND PELVIS WITH CONTRAST TECHNIQUE: Multidetector CT imaging of the chest, abdomen and pelvis was performed following the standard protocol during bolus administration of intravenous contrast. CONTRAST:  74m OMNIPAQUE IOHEXOL 350 MG/ML SOLN COMPARISON:  12/25/2020 FINDINGS: CT CHEST FINDINGS Cardiovascular: The heart is top-normal in size. No pericardial effusion. 3.8 cm descending thoracic aortic aneurysm just above the aortic hiatus (series 2/image 41). Atherosclerotic calcifications of the arch. Coronary atherosclerosis of the LAD. Right chest port terminates at the cavoatrial junction. Mediastinum/Nodes: No suspicious mediastinal lymphadenopathy. Visualized thyroid is unremarkable. Lungs/Pleura: Small left pleural effusion, mildly increased. Associated lingular and left lower lobe opacity, favoring compressive atelectasis. No suspicious pulmonary nodules. No focal consolidation. Moderate centrilobular and paraseptal emphysematous changes, upper lung predominant. No pneumothorax. Musculoskeletal: Mild degenerative changes of the thoracic spine with lower thoracic dextroscoliosis. Cervical spine fixation hardware, incompletely visualized. CT ABDOMEN PELVIS FINDINGS Hepatobiliary: 11 mm cyst in the right hepatic lobe (series 2/image 54).  Additional subcentimeter cyst in the inferior right liver is poorly visualized on the current study (series 2/image 55), benign. No suspicious/enhancing hepatic lesions. Status post cholecystectomy. No intrahepatic or extrahepatic ductal dilatation. Pancreas: Within normal limits. Spleen: Within normal limits. Adrenals/Urinary Tract: Adrenal glands are within normal limits. Kidneys are within normal limits.  No hydronephrosis. Bladder is within normal limits. Stomach/Bowel: Stomach is within normal limits. No evidence of bowel obstruction. Appendix is not discretely visualized. Sigmoid diverticulosis, without evidence of diverticulitis. Vascular/Lymphatic: 4.1 x 5.0 cm suprarenal abdominal aortic aneurysm (series 2/image 87), grossly unchanged. Atherosclerotic calcifications abdominal aorta and branch vessels. No suspicious abdominopelvic lymphadenopathy. Reproductive: Status post hysterectomy. 6.2 x 7.6 cm left adnexal cyst (series 2/image 87), previously 6.7 x 7.3 cm, unchanged. Other: No abdominopelvic ascites. Musculoskeletal: Degenerative changes of the lower lumbar spine. IMPRESSION: No evidence of recurrent or metastatic disease. Small left pleural effusion, mildly increased. Associated lingular and left lower lobe opacity, likely atelectasis. Stable lower thoracic and upper abdominal aortic aneurysms. Attention on follow-up is suggested in this patient on cancer surveillance. Stable left  adnexal cyst, of questionable clinical significance. Attention on follow-up is suggested given cancer surveillance. Electronically Signed   By: Julian Hy M.D.   On: 03/16/2021 16:47   CT Abdomen Pelvis W Contrast  Result Date: 03/16/2021 CLINICAL DATA:  Metastatic non-small cell lung cancer, chemotherapy in progress, for restaging EXAM: CT CHEST, ABDOMEN, AND PELVIS WITH CONTRAST TECHNIQUE: Multidetector CT imaging of the chest, abdomen and pelvis was performed following the standard protocol during bolus  administration of intravenous contrast. CONTRAST:  13m OMNIPAQUE IOHEXOL 350 MG/ML SOLN COMPARISON:  12/25/2020 FINDINGS: CT CHEST FINDINGS Cardiovascular: The heart is top-normal in size. No pericardial effusion. 3.8 cm descending thoracic aortic aneurysm just above the aortic hiatus (series 2/image 41). Atherosclerotic calcifications of the arch. Coronary atherosclerosis of the LAD. Right chest port terminates at the cavoatrial junction. Mediastinum/Nodes: No suspicious mediastinal lymphadenopathy. Visualized thyroid is unremarkable. Lungs/Pleura: Small left pleural effusion, mildly increased. Associated lingular and left lower lobe opacity, favoring compressive atelectasis. No suspicious pulmonary nodules. No focal consolidation. Moderate centrilobular and paraseptal emphysematous changes, upper lung predominant. No pneumothorax. Musculoskeletal: Mild degenerative changes of the thoracic spine with lower thoracic dextroscoliosis. Cervical spine fixation hardware, incompletely visualized. CT ABDOMEN PELVIS FINDINGS Hepatobiliary: 11 mm cyst in the right hepatic lobe (series 2/image 54). Additional subcentimeter cyst in the inferior right liver is poorly visualized on the current study (series 2/image 55), benign. No suspicious/enhancing hepatic lesions. Status post cholecystectomy. No intrahepatic or extrahepatic ductal dilatation. Pancreas: Within normal limits. Spleen: Within normal limits. Adrenals/Urinary Tract: Adrenal glands are within normal limits. Kidneys are within normal limits.  No hydronephrosis. Bladder is within normal limits. Stomach/Bowel: Stomach is within normal limits. No evidence of bowel obstruction. Appendix is not discretely visualized. Sigmoid diverticulosis, without evidence of diverticulitis. Vascular/Lymphatic: 4.1 x 5.0 cm suprarenal abdominal aortic aneurysm (series 2/image 87), grossly unchanged. Atherosclerotic calcifications abdominal aorta and branch vessels. No suspicious  abdominopelvic lymphadenopathy. Reproductive: Status post hysterectomy. 6.2 x 7.6 cm left adnexal cyst (series 2/image 87), previously 6.7 x 7.3 cm, unchanged. Other: No abdominopelvic ascites. Musculoskeletal: Degenerative changes of the lower lumbar spine. IMPRESSION: No evidence of recurrent or metastatic disease. Small left pleural effusion, mildly increased. Associated lingular and left lower lobe opacity, likely atelectasis. Stable lower thoracic and upper abdominal aortic aneurysms. Attention on follow-up is suggested in this patient on cancer surveillance. Stable left adnexal cyst, of questionable clinical significance. Attention on follow-up is suggested given cancer surveillance. Electronically Signed   By: SJulian HyM.D.   On: 03/16/2021 16:47     ASSESSMENT/PLAN:  This is a very pleasant 75year old African-American female diagnosed with stage IV non-small cell lung cancer, squamous cell carcinoma.  She presented with a right upper lobe lung mass and pleural based metastases and mediastinal lymphadenopathy. She was diagnosed in August 2020    The patient was on systemic chemotherapy with carboplatin for an AUC of 5, paclitaxel 175 mg/m, and Keytruda 200 mg IV every 3 weeks with Neulasta support.  Starting from cycle #5, she was on maintenance with single agent Keytruda.  She is status post 18 cycles of treatment and she tolerated it well without any concerning adverse effects except for mild fatigue. This was discontinued secondary to evidence of disease progression.   The patient is currently undergoing palliative systemic chemotherapy with docetaxel 75 mg per metered squared and Cyramza 10 mg/kg IV every 3 weeks with Neulasta support. Her dose of docetaxel was reduced to 65 mg/m2 starting from cycle #10 due to peripheral neuropathy.  Most recent restaging CT scans from 03/16/2021 revealed no evidence of disease progression. There are stable incidental findings including a left  adnexal cyst and lower thoracic and upper abdominal aortic aneurysms. We will continue to monitor these findings with serial imaging.   Labs from today were reviewed. CBC shows that WBC is 23.0, likely secondary to GCSF injection. She is afebrile and denies any infectious symptoms including fever, chills, cough, urinary symptoms. Recommend to monitor for any signs/symptoms of an infection. Hgb is stable at 10.6 and Plts are back to normal. CMP shows K is 3.1 so we gave 40 mEq of oral potassium chloride during her treatment. Patient ran out of her home prescription so refill was sent today. Additionally, her magnesium level is 1.5 so sent prescription of oral magnesium today. Urinalysis shows only trace amount of protein.   Patient will proceed with treatment today without any dose modifications.She will return for a G-CSF injection on 03/23/2021. She will return for labs and follow up visit with Dr. Julien Nordmann prior to Cycle 23 on 04/11/2021.  All questions were answered. The patient knows to call the clinic with any problems, questions or concerns.    No orders of the defined types were placed in this encounter.   I have spent a total of 30 minutes minutes of face-to-face and non-face-to-face time, preparing to see the patient, performing a medically appropriate examination, counseling and educating the patient, ordering medications/, documenting clinical information in the electronic health record and care coordination.    Lincoln Brigham, PA-C [facility] [place] P: [phone]

## 2021-03-21 NOTE — Progress Notes (Signed)
Sent potassium and magnesium supplements to pharmacy.

## 2021-03-22 ENCOUNTER — Telehealth: Payer: Self-pay | Admitting: *Deleted

## 2021-03-22 ENCOUNTER — Ambulatory Visit: Payer: Medicare HMO

## 2021-03-22 MED ORDER — SPIRIVA HANDIHALER 18 MCG IN CAPS
18.0000 ug | ORAL_CAPSULE | Freq: Every day | RESPIRATORY_TRACT | 2 refills | Status: DC
Start: 2021-03-22 — End: 2022-04-23

## 2021-03-22 NOTE — Telephone Encounter (Signed)
Ok. Will try spiriva. Can triage or pharmacy reach out the patient to let her know about the change and also confirm she has teaching on using this new device?

## 2021-03-22 NOTE — Telephone Encounter (Signed)
Call from pt - stated the pharmacy did not receive rx for the new inhaler. Incruse was prescribed 02/05/21.  I called [facility] pharmacy who stated Incruse is non-formulary; not covered by pt's insurance. They recommended Spiriva.  Pt was called and informed of the above. Informed pt I will make her doctor aware.

## 2021-03-22 NOTE — Telephone Encounter (Addendum)
I called / informed pt of Spiriva rx. I asked if she will need any teaching on how to it - she stated "no". I told she can ask [facility] pharmacist or call our office back and our pharmacist can help- stated "ok".

## 2021-03-23 ENCOUNTER — Other Ambulatory Visit: Payer: Self-pay

## 2021-03-23 ENCOUNTER — Telehealth: Payer: Self-pay

## 2021-03-23 ENCOUNTER — Inpatient Hospital Stay: Payer: Medicare HMO

## 2021-03-23 VITALS — BP 128/87 | HR 88 | Temp 99.0°F | Resp 16

## 2021-03-23 DIAGNOSIS — E876 Hypokalemia: Secondary | ICD-10-CM | POA: Diagnosis not present

## 2021-03-23 DIAGNOSIS — Z5111 Encounter for antineoplastic chemotherapy: Secondary | ICD-10-CM | POA: Diagnosis not present

## 2021-03-23 DIAGNOSIS — C3491 Malignant neoplasm of unspecified part of right bronchus or lung: Secondary | ICD-10-CM

## 2021-03-23 DIAGNOSIS — G629 Polyneuropathy, unspecified: Secondary | ICD-10-CM | POA: Diagnosis not present

## 2021-03-23 DIAGNOSIS — C3411 Malignant neoplasm of upper lobe, right bronchus or lung: Secondary | ICD-10-CM | POA: Diagnosis not present

## 2021-03-23 DIAGNOSIS — J449 Chronic obstructive pulmonary disease, unspecified: Secondary | ICD-10-CM | POA: Diagnosis not present

## 2021-03-23 DIAGNOSIS — C782 Secondary malignant neoplasm of pleura: Secondary | ICD-10-CM | POA: Diagnosis not present

## 2021-03-23 DIAGNOSIS — Z5189 Encounter for other specified aftercare: Secondary | ICD-10-CM | POA: Diagnosis not present

## 2021-03-23 DIAGNOSIS — I10 Essential (primary) hypertension: Secondary | ICD-10-CM | POA: Diagnosis not present

## 2021-03-23 DIAGNOSIS — I714 Abdominal aortic aneurysm, without rupture, unspecified: Secondary | ICD-10-CM | POA: Diagnosis not present

## 2021-03-23 MED ORDER — PEGFILGRASTIM-JMDB 6 MG/0.6ML ~~LOC~~ SOSY
6.0000 mg | PREFILLED_SYRINGE | Freq: Once | SUBCUTANEOUS | Status: AC
  Administered 2021-03-23: 10:00:00 6 mg via SUBCUTANEOUS
  Filled 2021-03-23: qty 0.6

## 2021-03-23 NOTE — Telephone Encounter (Signed)
I spoke with pt and advised as indicated. She expressed understanding of this information. °

## 2021-03-23 NOTE — Telephone Encounter (Signed)
-----   Message from [employer], PA-C sent at 03/20/2021  1:42 PM EST ----- Regarding: FW: Can you let her know Dr. Julien Nordmann reviewed her scan and it looks stable from a cancer standpoint. ----- Message ----- From: Interface, Rad Results In Sent: 03/16/2021   4:50 PM EST To: Cassandra L Heilingoetter, PA-C

## 2021-03-24 ENCOUNTER — Ambulatory Visit: Payer: Medicare HMO

## 2021-03-28 ENCOUNTER — Inpatient Hospital Stay: Payer: Medicare HMO | Attending: Internal Medicine

## 2021-03-28 ENCOUNTER — Other Ambulatory Visit: Payer: Self-pay

## 2021-03-28 DIAGNOSIS — Z452 Encounter for adjustment and management of vascular access device: Secondary | ICD-10-CM | POA: Insufficient documentation

## 2021-03-28 DIAGNOSIS — K5730 Diverticulosis of large intestine without perforation or abscess without bleeding: Secondary | ICD-10-CM | POA: Diagnosis not present

## 2021-03-28 DIAGNOSIS — C782 Secondary malignant neoplasm of pleura: Secondary | ICD-10-CM | POA: Diagnosis not present

## 2021-03-28 DIAGNOSIS — Z79899 Other long term (current) drug therapy: Secondary | ICD-10-CM | POA: Diagnosis not present

## 2021-03-28 DIAGNOSIS — I10 Essential (primary) hypertension: Secondary | ICD-10-CM | POA: Insufficient documentation

## 2021-03-28 DIAGNOSIS — Z5112 Encounter for antineoplastic immunotherapy: Secondary | ICD-10-CM | POA: Insufficient documentation

## 2021-03-28 DIAGNOSIS — G4730 Sleep apnea, unspecified: Secondary | ICD-10-CM | POA: Diagnosis not present

## 2021-03-28 DIAGNOSIS — Z5111 Encounter for antineoplastic chemotherapy: Secondary | ICD-10-CM | POA: Diagnosis not present

## 2021-03-28 DIAGNOSIS — Z95828 Presence of other vascular implants and grafts: Secondary | ICD-10-CM

## 2021-03-28 DIAGNOSIS — C3492 Malignant neoplasm of unspecified part of left bronchus or lung: Secondary | ICD-10-CM | POA: Insufficient documentation

## 2021-03-28 DIAGNOSIS — J449 Chronic obstructive pulmonary disease, unspecified: Secondary | ICD-10-CM | POA: Diagnosis not present

## 2021-03-28 DIAGNOSIS — Z5189 Encounter for other specified aftercare: Secondary | ICD-10-CM | POA: Insufficient documentation

## 2021-03-28 DIAGNOSIS — C3491 Malignant neoplasm of unspecified part of right bronchus or lung: Secondary | ICD-10-CM

## 2021-03-28 DIAGNOSIS — I714 Abdominal aortic aneurysm, without rupture, unspecified: Secondary | ICD-10-CM | POA: Diagnosis not present

## 2021-03-28 DIAGNOSIS — Z86718 Personal history of other venous thrombosis and embolism: Secondary | ICD-10-CM | POA: Diagnosis not present

## 2021-03-28 LAB — CBC WITH DIFFERENTIAL (CANCER CENTER ONLY)
Abs Immature Granulocytes: 0.02 10*3/uL (ref 0.00–0.07)
Basophils Absolute: 0 10*3/uL (ref 0.0–0.1)
Basophils Relative: 2 %
Eosinophils Absolute: 0 10*3/uL (ref 0.0–0.5)
Eosinophils Relative: 0 %
HCT: 30 % — ABNORMAL LOW (ref 36.0–46.0)
Hemoglobin: 10 g/dL — ABNORMAL LOW (ref 12.0–15.0)
Immature Granulocytes: 1 %
Lymphocytes Relative: 59 %
Lymphs Abs: 1 10*3/uL (ref 0.7–4.0)
MCH: 33.6 pg (ref 26.0–34.0)
MCHC: 33.3 g/dL (ref 30.0–36.0)
MCV: 100.7 fL — ABNORMAL HIGH (ref 80.0–100.0)
Monocytes Absolute: 0.2 10*3/uL (ref 0.1–1.0)
Monocytes Relative: 9 %
Neutro Abs: 0.5 10*3/uL — ABNORMAL LOW (ref 1.7–7.7)
Neutrophils Relative %: 29 %
Platelet Count: 86 10*3/uL — ABNORMAL LOW (ref 150–400)
RBC: 2.98 MIL/uL — ABNORMAL LOW (ref 3.87–5.11)
RDW: 17.6 % — ABNORMAL HIGH (ref 11.5–15.5)
WBC Count: 1.7 10*3/uL — ABNORMAL LOW (ref 4.0–10.5)
nRBC: 0 % (ref 0.0–0.2)

## 2021-03-28 LAB — CMP (CANCER CENTER ONLY)
ALT: 6 U/L (ref 0–44)
AST: 12 U/L — ABNORMAL LOW (ref 15–41)
Albumin: 3.3 g/dL — ABNORMAL LOW (ref 3.5–5.0)
Alkaline Phosphatase: 54 U/L (ref 38–126)
Anion gap: 5 (ref 5–15)
BUN: 24 mg/dL — ABNORMAL HIGH (ref 8–23)
CO2: 34 mmol/L — ABNORMAL HIGH (ref 22–32)
Calcium: 9 mg/dL (ref 8.9–10.3)
Chloride: 99 mmol/L (ref 98–111)
Creatinine: 0.69 mg/dL (ref 0.44–1.00)
GFR, Estimated: >60 mL/min (ref >60)
Glucose, Bld: 86 mg/dL (ref 70–99)
Potassium: 3.1 mmol/L — ABNORMAL LOW (ref 3.5–5.1)
Sodium: 138 mmol/L (ref 135–145)
Total Bilirubin: 1.2 mg/dL (ref 0.3–1.2)
Total Protein: 5.9 g/dL — ABNORMAL LOW (ref 6.5–8.1)

## 2021-03-28 MED ORDER — SODIUM CHLORIDE 0.9% FLUSH: 10.0000 mL | INTRAVENOUS | Status: DC | PRN

## 2021-03-28 MED ORDER — HEPARIN SOD (PORK) LOCK FLUSH 100 UNIT/ML IV SOLN
500.0000 [IU] | Freq: Once | INTRAVENOUS | Status: AC | PRN
  Administered 2021-03-28: 500 [IU]

## 2021-04-04 ENCOUNTER — Other Ambulatory Visit: Payer: Self-pay

## 2021-04-04 ENCOUNTER — Other Ambulatory Visit: Payer: Self-pay | Admitting: Physician Assistant

## 2021-04-04 ENCOUNTER — Inpatient Hospital Stay: Payer: Medicare HMO

## 2021-04-04 DIAGNOSIS — Z5189 Encounter for other specified aftercare: Secondary | ICD-10-CM | POA: Diagnosis not present

## 2021-04-04 DIAGNOSIS — Z95828 Presence of other vascular implants and grafts: Secondary | ICD-10-CM

## 2021-04-04 DIAGNOSIS — Z5112 Encounter for antineoplastic immunotherapy: Secondary | ICD-10-CM | POA: Diagnosis not present

## 2021-04-04 DIAGNOSIS — Z452 Encounter for adjustment and management of vascular access device: Secondary | ICD-10-CM | POA: Diagnosis not present

## 2021-04-04 DIAGNOSIS — Z5111 Encounter for antineoplastic chemotherapy: Secondary | ICD-10-CM | POA: Diagnosis not present

## 2021-04-04 DIAGNOSIS — E876 Hypokalemia: Secondary | ICD-10-CM

## 2021-04-04 DIAGNOSIS — C782 Secondary malignant neoplasm of pleura: Secondary | ICD-10-CM | POA: Diagnosis not present

## 2021-04-04 DIAGNOSIS — I714 Abdominal aortic aneurysm, without rupture, unspecified: Secondary | ICD-10-CM | POA: Diagnosis not present

## 2021-04-04 DIAGNOSIS — I10 Essential (primary) hypertension: Secondary | ICD-10-CM | POA: Diagnosis not present

## 2021-04-04 DIAGNOSIS — C3491 Malignant neoplasm of unspecified part of right bronchus or lung: Secondary | ICD-10-CM

## 2021-04-04 DIAGNOSIS — J449 Chronic obstructive pulmonary disease, unspecified: Secondary | ICD-10-CM | POA: Diagnosis not present

## 2021-04-04 DIAGNOSIS — C3492 Malignant neoplasm of unspecified part of left bronchus or lung: Secondary | ICD-10-CM | POA: Diagnosis not present

## 2021-04-04 LAB — CBC WITH DIFFERENTIAL (CANCER CENTER ONLY)
Abs Immature Granulocytes: 0.16 10*3/uL — ABNORMAL HIGH (ref 0.00–0.07)
Basophils Absolute: 0.1 10*3/uL (ref 0.0–0.1)
Basophils Relative: 0 %
Eosinophils Absolute: 0 10*3/uL (ref 0.0–0.5)
Eosinophils Relative: 0 %
HCT: 33.9 % — ABNORMAL LOW (ref 36.0–46.0)
Hemoglobin: 10.8 g/dL — ABNORMAL LOW (ref 12.0–15.0)
Immature Granulocytes: 1 %
Lymphocytes Relative: 18 %
Lymphs Abs: 2.5 10*3/uL (ref 0.7–4.0)
MCH: 32.5 pg (ref 26.0–34.0)
MCHC: 31.9 g/dL (ref 30.0–36.0)
MCV: 102.1 fL — ABNORMAL HIGH (ref 80.0–100.0)
Monocytes Absolute: 0.5 10*3/uL (ref 0.1–1.0)
Monocytes Relative: 3 %
Neutro Abs: 11 10*3/uL — ABNORMAL HIGH (ref 1.7–7.7)
Neutrophils Relative %: 78 %
Platelet Count: 142 10*3/uL — ABNORMAL LOW (ref 150–400)
RBC: 3.32 MIL/uL — ABNORMAL LOW (ref 3.87–5.11)
RDW: 18.2 % — ABNORMAL HIGH (ref 11.5–15.5)
WBC Count: 14.2 10*3/uL — ABNORMAL HIGH (ref 4.0–10.5)
nRBC: 0.1 % (ref 0.0–0.2)

## 2021-04-04 LAB — CMP (CANCER CENTER ONLY)
ALT: 6 U/L (ref 0–44)
AST: 14 U/L — ABNORMAL LOW (ref 15–41)
Albumin: 3.5 g/dL (ref 3.5–5.0)
Alkaline Phosphatase: 81 U/L (ref 38–126)
Anion gap: 7 (ref 5–15)
BUN: 18 mg/dL (ref 8–23)
CO2: 34 mmol/L — ABNORMAL HIGH (ref 22–32)
Calcium: 8.9 mg/dL (ref 8.9–10.3)
Chloride: 102 mmol/L (ref 98–111)
Creatinine: 0.81 mg/dL (ref 0.44–1.00)
GFR, Estimated: >60 mL/min (ref >60)
Glucose, Bld: 95 mg/dL (ref 70–99)
Potassium: 2.9 mmol/L — ABNORMAL LOW (ref 3.5–5.1)
Sodium: 143 mmol/L (ref 135–145)
Total Bilirubin: 0.7 mg/dL (ref 0.3–1.2)
Total Protein: 6.3 g/dL — ABNORMAL LOW (ref 6.5–8.1)

## 2021-04-04 MED ORDER — HEPARIN SOD (PORK) LOCK FLUSH 100 UNIT/ML IV SOLN
500.0000 [IU] | Freq: Once | INTRAVENOUS | Status: AC | PRN
  Administered 2021-04-04: 500 [IU]

## 2021-04-04 MED ORDER — POTASSIUM CHLORIDE CRYS ER 20 MEQ PO TBCR: 20.0000 meq | EXTENDED_RELEASE_TABLET | Freq: Two times a day (BID) | ORAL | 0 refills | Status: DC

## 2021-04-04 MED ORDER — SODIUM CHLORIDE 0.9% FLUSH
10.0000 mL | INTRAVENOUS | Status: DC | PRN
  Administered 2021-04-04: 10 mL

## 2021-04-11 ENCOUNTER — Inpatient Hospital Stay: Payer: Medicare HMO | Admitting: Internal Medicine

## 2021-04-11 ENCOUNTER — Other Ambulatory Visit: Payer: Self-pay

## 2021-04-11 ENCOUNTER — Encounter: Payer: Self-pay | Admitting: Internal Medicine

## 2021-04-11 ENCOUNTER — Inpatient Hospital Stay: Payer: Medicare HMO

## 2021-04-11 VITALS — BP 121/74 | HR 94 | Temp 97.0°F | Resp 18 | Ht 61.0 in | Wt 139.2 lb

## 2021-04-11 DIAGNOSIS — Z5111 Encounter for antineoplastic chemotherapy: Secondary | ICD-10-CM

## 2021-04-11 DIAGNOSIS — Z452 Encounter for adjustment and management of vascular access device: Secondary | ICD-10-CM | POA: Diagnosis not present

## 2021-04-11 DIAGNOSIS — I714 Abdominal aortic aneurysm, without rupture, unspecified: Secondary | ICD-10-CM | POA: Diagnosis not present

## 2021-04-11 DIAGNOSIS — C782 Secondary malignant neoplasm of pleura: Secondary | ICD-10-CM | POA: Diagnosis not present

## 2021-04-11 DIAGNOSIS — C3492 Malignant neoplasm of unspecified part of left bronchus or lung: Secondary | ICD-10-CM | POA: Diagnosis not present

## 2021-04-11 DIAGNOSIS — C3491 Malignant neoplasm of unspecified part of right bronchus or lung: Secondary | ICD-10-CM

## 2021-04-11 DIAGNOSIS — J449 Chronic obstructive pulmonary disease, unspecified: Secondary | ICD-10-CM | POA: Diagnosis not present

## 2021-04-11 DIAGNOSIS — Z5189 Encounter for other specified aftercare: Secondary | ICD-10-CM | POA: Diagnosis not present

## 2021-04-11 DIAGNOSIS — Z5112 Encounter for antineoplastic immunotherapy: Secondary | ICD-10-CM | POA: Diagnosis not present

## 2021-04-11 DIAGNOSIS — I10 Essential (primary) hypertension: Secondary | ICD-10-CM | POA: Diagnosis not present

## 2021-04-11 DIAGNOSIS — Z95828 Presence of other vascular implants and grafts: Secondary | ICD-10-CM

## 2021-04-11 LAB — CMP (CANCER CENTER ONLY)
ALT: 5 U/L (ref 0–44)
AST: 14 U/L — ABNORMAL LOW (ref 15–41)
Albumin: 3.3 g/dL — ABNORMAL LOW (ref 3.5–5.0)
Alkaline Phosphatase: 57 U/L (ref 38–126)
Anion gap: 4 — ABNORMAL LOW (ref 5–15)
BUN: 20 mg/dL (ref 8–23)
CO2: 32 mmol/L (ref 22–32)
Calcium: 9.1 mg/dL (ref 8.9–10.3)
Chloride: 104 mmol/L (ref 98–111)
Creatinine: 0.78 mg/dL (ref 0.44–1.00)
GFR, Estimated: >60 mL/min (ref >60)
Glucose, Bld: 91 mg/dL (ref 70–99)
Potassium: 3.9 mmol/L (ref 3.5–5.1)
Sodium: 140 mmol/L (ref 135–145)
Total Bilirubin: 0.6 mg/dL (ref 0.3–1.2)
Total Protein: 6 g/dL — ABNORMAL LOW (ref 6.5–8.1)

## 2021-04-11 LAB — CBC WITH DIFFERENTIAL (CANCER CENTER ONLY)
Abs Immature Granulocytes: 0.03 10*3/uL (ref 0.00–0.07)
Basophils Absolute: 0 10*3/uL (ref 0.0–0.1)
Basophils Relative: 0 %
Eosinophils Absolute: 0 10*3/uL (ref 0.0–0.5)
Eosinophils Relative: 0 %
HCT: 33.1 % — ABNORMAL LOW (ref 36.0–46.0)
Hemoglobin: 10.7 g/dL — ABNORMAL LOW (ref 12.0–15.0)
Immature Granulocytes: 0 %
Lymphocytes Relative: 25 %
Lymphs Abs: 2 10*3/uL (ref 0.7–4.0)
MCH: 33.3 pg (ref 26.0–34.0)
MCHC: 32.3 g/dL (ref 30.0–36.0)
MCV: 103.1 fL — ABNORMAL HIGH (ref 80.0–100.0)
Monocytes Absolute: 0.6 10*3/uL (ref 0.1–1.0)
Monocytes Relative: 8 %
Neutro Abs: 5.3 10*3/uL (ref 1.7–7.7)
Neutrophils Relative %: 67 %
Platelet Count: 178 10*3/uL (ref 150–400)
RBC: 3.21 MIL/uL — ABNORMAL LOW (ref 3.87–5.11)
RDW: 18.6 % — ABNORMAL HIGH (ref 11.5–15.5)
WBC Count: 8 10*3/uL (ref 4.0–10.5)
nRBC: 0 % (ref 0.0–0.2)

## 2021-04-11 LAB — TOTAL PROTEIN, URINE DIPSTICK: Protein, ur: <30 mg/dL

## 2021-04-11 MED ORDER — HEPARIN SOD (PORK) LOCK FLUSH 100 UNIT/ML IV SOLN
500.0000 [IU] | Freq: Once | INTRAVENOUS | Status: AC | PRN
  Administered 2021-04-11: 500 [IU]

## 2021-04-11 MED ORDER — SODIUM CHLORIDE 0.9 % IV SOLN
10.0000 mg | Freq: Once | INTRAVENOUS | Status: AC
  Administered 2021-04-11: 10 mg via INTRAVENOUS
  Filled 2021-04-11: qty 10

## 2021-04-11 MED ORDER — DIPHENHYDRAMINE HCL 50 MG/ML IJ SOLN
50.0000 mg | Freq: Once | INTRAMUSCULAR | Status: AC
  Administered 2021-04-11: 50 mg via INTRAVENOUS
  Filled 2021-04-11: qty 1

## 2021-04-11 MED ORDER — SODIUM CHLORIDE 0.9% FLUSH
10.0000 mL | INTRAVENOUS | Status: DC | PRN
  Administered 2021-04-11: 10 mL

## 2021-04-11 MED ORDER — ACETAMINOPHEN 325 MG PO TABS
650.0000 mg | ORAL_TABLET | Freq: Once | ORAL | Status: AC
  Administered 2021-04-11: 650 mg via ORAL
  Filled 2021-04-11: qty 2

## 2021-04-11 MED ORDER — SODIUM CHLORIDE 0.9 % IV SOLN
65.0000 mg/m2 | Freq: Once | INTRAVENOUS | Status: AC
  Administered 2021-04-11: 110 mg via INTRAVENOUS
  Filled 2021-04-11: qty 11

## 2021-04-11 MED ORDER — SODIUM CHLORIDE 0.9 % IV SOLN
10.0000 mg/kg | Freq: Once | INTRAVENOUS | Status: AC
  Administered 2021-04-11: 600 mg via INTRAVENOUS
  Filled 2021-04-11: qty 50

## 2021-04-11 MED ORDER — SODIUM CHLORIDE 0.9 % IV SOLN: Freq: Once | INTRAVENOUS | Status: AC

## 2021-04-11 NOTE — Progress Notes (Signed)
[place] [phone]   [phone]  OFFICE PROGRESS NOTE  [name], MD [place] [address]  DIAGNOSIS: Stage IV non-small cell lung cancer, squamous cell carcinoma. She presented with right upper lobe lung mass in addition to pleural-based metastasis and mediastinal lymphadenopathy. She was diagnosed in September 2020.    PRIOR THERAPY: Chemotherapy with carboplatin for an AUC of 5, paclitaxel 175 mg/m, and Keytruda 200 mg IV every 3 weeks with Neulasta support. Last dose 12/08/19. Status post 18 cycles.  Starting from cycle #5 was on maintenance treatment with single agent Keytruda every 3 weeks. This was discontinued due to evidence of disease progression.    CURRENT THERAPY: Palliative systemic chemotherapy with docetaxel 75 mg/m2 and Cyramza 10 mg/kg IV every 3 weeks with Neulasta support. First dose on 01/04/20.  Status post 22 cycles.  Starting from cycle #10 docetaxel was reduced to 65 Mg/M2  INTERVAL HISTORY: Debra Rodgers 76 y.o. female returns to the clinic today for follow-up visit.  The patient is feeling fine today with no concerning complaints except for headache earlier today.  It usually improves with Tylenol.  She had MRI of the brain in September 2022 that was unremarkable for any metastatic disease to the brain.  She denied having any current chest pain but has shortness of breath with exertion with no cough or hemoptysis.  She has no nausea, vomiting, diarrhea or constipation.  She has no headache or visual changes.  She has no weight loss or night sweats.  The patient is here today for evaluation before starting cycle #23.   MEDICAL HISTORY: Past Medical History:  Diagnosis Date   AAA (abdominal aortic aneurysm)    COPD (chronic obstructive pulmonary disease) (HCC)    Depression    DVT (deep venous thrombosis) ([place]) 07/12/2019   Essential hypertension    Headache    History of migraine  headaches    lung ca dx'd 10/2018   Sleep apnea    Tobacco use disorder     ALLERGIES:  is allergic to codeine sulfate and pantoprazole sodium.  MEDICATIONS:  Current Outpatient Medications  Medication Sig Dispense Refill   acetaminophen (TYLENOL) 500 MG tablet Take 500 mg by mouth every 6 (six) hours as needed for moderate pain.     albuterol (VENTOLIN HFA) 108 (90 Base) MCG/ACT inhaler Inhale 2 puffs into the lungs every 6 (six) hours as needed for wheezing or shortness of breath. 4 each 0   amLODipine (NORVASC) 10 MG tablet TAKE 1 TABLET EVERY DAY 90 tablet 3   atorvastatin (LIPITOR) 20 MG tablet TAKE 1 TABLET EVERY DAY 90 tablet 3   chlorthalidone (HYGROTON) 50 MG tablet TAKE 1 TABLET EVERY DAY (Patient taking differently: Take 50 mg by mouth daily.) 90 tablet 3   dexamethasone (DECADRON) 4 MG tablet TAKE 2 TABLETS TWICE DAILY THE DAY BEFORE, THE DAY OF, AND THE DAY AFTER CHEMOTHERAPY 51 tablet 2   famotidine (PEPCID) 20 MG tablet Take 20 mg by mouth daily.      FLUoxetine (PROZAC) 20 MG capsule TAKE 1 CAPSULE EVERY DAY (Patient taking differently: Take 20 mg by mouth daily.) 90 capsule 3   fluticasone (FLONASE) 50 MCG/ACT nasal spray Place 1 spray into both nostrils daily. 11.1 mL 0   gabapentin (NEURONTIN) 100 MG capsule Take 1 capsule (100 mg total) by mouth 2 (two) times daily. 90 capsule 2   lidocaine-prilocaine (EMLA) cream Apply 1  application topically as needed. 30 g 1   magnesium oxide (MAG-OX) 400 (240 Mg) MG tablet Take 1 tablet (400 mg total) by mouth daily. 30 tablet 2   polyvinyl alcohol (LIQUIFILM TEARS) 1.4 % ophthalmic solution Place 1 drop into both eyes as needed for dry eyes.     potassium chloride SA (KLOR-CON M) 20 MEQ tablet Take 1 tablet (20 mEq total) by mouth 2 (two) times daily. 20 tablet 0   tiotropium (SPIRIVA HANDIHALER) 18 MCG inhalation capsule Place 1 capsule (18 mcg total) into inhaler and inhale daily. 30 capsule 2   No current facility-administered  medications for this visit.   Facility-Administered Medications Ordered in Other Visits  Medication Dose Route Frequency Provider Last Rate Last Admin   sodium chloride flush (NS) 0.9 % injection 10 mL  10 mL Intracatheter PRN Curt Bears, MD   10 mL at 04/11/21 0909    SURGICAL HISTORY:  Past Surgical History:  Procedure Laterality Date   ABDOMINAL HYSTERECTOMY     CHOLECYSTECTOMY     IR IMAGING GUIDED PORT INSERTION  02/24/2019   ROTATOR CUFF REPAIR  3/04   VIDEO BRONCHOSCOPY WITH ENDOBRONCHIAL ULTRASOUND Right 11/25/2018   Procedure: VIDEO BRONCHOSCOPY WITH ENDOBRONCHIAL ULTRASOUND;  Surgeon: Collene Gobble, MD;  Location: [facility] OR;  Service: Thoracic;  Laterality: Right;    REVIEW OF SYSTEMS:  A comprehensive review of systems was negative except for: Constitutional: positive for fatigue Respiratory: positive for dyspnea on exertion Musculoskeletal: positive for arthralgias   PHYSICAL EXAMINATION: General appearance: alert, cooperative, fatigued, and no distress Head: Normocephalic, without obvious abnormality, atraumatic Neck: no adenopathy, no JVD, supple, symmetrical, trachea midline, and thyroid not enlarged, symmetric, no tenderness/mass/nodules Lymph nodes: Cervical, supraclavicular, and axillary nodes normal. Resp: clear to auscultation bilaterally Back: symmetric, no curvature. ROM normal. No CVA tenderness. Cardio: regular rate and rhythm, S1, S2 normal, no murmur, click, rub or gallop GI: soft, non-tender; bowel sounds normal; no masses,  no organomegaly Extremities: extremities normal, atraumatic, no cyanosis or edema  ECOG PERFORMANCE STATUS: 1 - Symptomatic but completely ambulatory  Blood pressure 121/74, pulse 94, temperature (!) 97 F (36.1 C), temperature source Tympanic, resp. rate 18, height 5' 1"  (1.549 m), weight 139 lb 3.2 oz (63.1 kg), SpO2 95 %.  LABORATORY DATA: Lab Results  Component Value Date   WBC 8.0 04/11/2021   HGB 10.7 (L) 04/11/2021    HCT 33.1 (L) 04/11/2021   MCV 103.1 (H) 04/11/2021   PLT 178 04/11/2021      Chemistry      Component Value Date/Time   NA 143 04/04/2021 0917   NA 139 07/21/2017 0958   K 2.9 (L) 04/04/2021 0917   CL 102 04/04/2021 0917   CO2 34 (H) 04/04/2021 0917   BUN 18 04/04/2021 0917   BUN 26 07/21/2017 0958   CREATININE 0.81 04/04/2021 0917   CREATININE 0.79 09/03/2013 1540      Component Value Date/Time   CALCIUM 8.9 04/04/2021 0917   ALKPHOS 81 04/04/2021 0917   AST 14 (L) 04/04/2021 0917   ALT 6 04/04/2021 0917   BILITOT 0.7 04/04/2021 0917       RADIOGRAPHIC STUDIES: CT Chest W Contrast  Result Date: 03/16/2021 CLINICAL DATA:  Metastatic non-small cell lung cancer, chemotherapy in progress, for restaging EXAM: CT CHEST, ABDOMEN, AND PELVIS WITH CONTRAST TECHNIQUE: Multidetector CT imaging of the chest, abdomen and pelvis was performed following the standard protocol during bolus administration of intravenous contrast. CONTRAST:  52m OMNIPAQUE IOHEXOL  350 MG/ML SOLN COMPARISON:  12/25/2020 FINDINGS: CT CHEST FINDINGS Cardiovascular: The heart is top-normal in size. No pericardial effusion. 3.8 cm descending thoracic aortic aneurysm just above the aortic hiatus (series 2/image 41). Atherosclerotic calcifications of the arch. Coronary atherosclerosis of the LAD. Right chest port terminates at the cavoatrial junction. Mediastinum/Nodes: No suspicious mediastinal lymphadenopathy. Visualized thyroid is unremarkable. Lungs/Pleura: Small left pleural effusion, mildly increased. Associated lingular and left lower lobe opacity, favoring compressive atelectasis. No suspicious pulmonary nodules. No focal consolidation. Moderate centrilobular and paraseptal emphysematous changes, upper lung predominant. No pneumothorax. Musculoskeletal: Mild degenerative changes of the thoracic spine with lower thoracic dextroscoliosis. Cervical spine fixation hardware, incompletely visualized. CT ABDOMEN PELVIS  FINDINGS Hepatobiliary: 11 mm cyst in the right hepatic lobe (series 2/image 54). Additional subcentimeter cyst in the inferior right liver is poorly visualized on the current study (series 2/image 55), benign. No suspicious/enhancing hepatic lesions. Status post cholecystectomy. No intrahepatic or extrahepatic ductal dilatation. Pancreas: Within normal limits. Spleen: Within normal limits. Adrenals/Urinary Tract: Adrenal glands are within normal limits. Kidneys are within normal limits.  No hydronephrosis. Bladder is within normal limits. Stomach/Bowel: Stomach is within normal limits. No evidence of bowel obstruction. Appendix is not discretely visualized. Sigmoid diverticulosis, without evidence of diverticulitis. Vascular/Lymphatic: 4.1 x 5.0 cm suprarenal abdominal aortic aneurysm (series 2/image 87), grossly unchanged. Atherosclerotic calcifications abdominal aorta and branch vessels. No suspicious abdominopelvic lymphadenopathy. Reproductive: Status post hysterectomy. 6.2 x 7.6 cm left adnexal cyst (series 2/image 87), previously 6.7 x 7.3 cm, unchanged. Other: No abdominopelvic ascites. Musculoskeletal: Degenerative changes of the lower lumbar spine. IMPRESSION: No evidence of recurrent or metastatic disease. Small left pleural effusion, mildly increased. Associated lingular and left lower lobe opacity, likely atelectasis. Stable lower thoracic and upper abdominal aortic aneurysms. Attention on follow-up is suggested in this patient on cancer surveillance. Stable left adnexal cyst, of questionable clinical significance. Attention on follow-up is suggested given cancer surveillance. Electronically Signed   By: Julian Hy M.D.   On: 03/16/2021 16:47   CT Abdomen Pelvis W Contrast  Result Date: 03/16/2021 CLINICAL DATA:  Metastatic non-small cell lung cancer, chemotherapy in progress, for restaging EXAM: CT CHEST, ABDOMEN, AND PELVIS WITH CONTRAST TECHNIQUE: Multidetector CT imaging of the chest,  abdomen and pelvis was performed following the standard protocol during bolus administration of intravenous contrast. CONTRAST:  31m OMNIPAQUE IOHEXOL 350 MG/ML SOLN COMPARISON:  12/25/2020 FINDINGS: CT CHEST FINDINGS Cardiovascular: The heart is top-normal in size. No pericardial effusion. 3.8 cm descending thoracic aortic aneurysm just above the aortic hiatus (series 2/image 41). Atherosclerotic calcifications of the arch. Coronary atherosclerosis of the LAD. Right chest port terminates at the cavoatrial junction. Mediastinum/Nodes: No suspicious mediastinal lymphadenopathy. Visualized thyroid is unremarkable. Lungs/Pleura: Small left pleural effusion, mildly increased. Associated lingular and left lower lobe opacity, favoring compressive atelectasis. No suspicious pulmonary nodules. No focal consolidation. Moderate centrilobular and paraseptal emphysematous changes, upper lung predominant. No pneumothorax. Musculoskeletal: Mild degenerative changes of the thoracic spine with lower thoracic dextroscoliosis. Cervical spine fixation hardware, incompletely visualized. CT ABDOMEN PELVIS FINDINGS Hepatobiliary: 11 mm cyst in the right hepatic lobe (series 2/image 54). Additional subcentimeter cyst in the inferior right liver is poorly visualized on the current study (series 2/image 55), benign. No suspicious/enhancing hepatic lesions. Status post cholecystectomy. No intrahepatic or extrahepatic ductal dilatation. Pancreas: Within normal limits. Spleen: Within normal limits. Adrenals/Urinary Tract: Adrenal glands are within normal limits. Kidneys are within normal limits.  No hydronephrosis. Bladder is within normal limits. Stomach/Bowel: Stomach is within normal limits.  No evidence of bowel obstruction. Appendix is not discretely visualized. Sigmoid diverticulosis, without evidence of diverticulitis. Vascular/Lymphatic: 4.1 x 5.0 cm suprarenal abdominal aortic aneurysm (series 2/image 87), grossly unchanged.  Atherosclerotic calcifications abdominal aorta and branch vessels. No suspicious abdominopelvic lymphadenopathy. Reproductive: Status post hysterectomy. 6.2 x 7.6 cm left adnexal cyst (series 2/image 87), previously 6.7 x 7.3 cm, unchanged. Other: No abdominopelvic ascites. Musculoskeletal: Degenerative changes of the lower lumbar spine. IMPRESSION: No evidence of recurrent or metastatic disease. Small left pleural effusion, mildly increased. Associated lingular and left lower lobe opacity, likely atelectasis. Stable lower thoracic and upper abdominal aortic aneurysms. Attention on follow-up is suggested in this patient on cancer surveillance. Stable left adnexal cyst, of questionable clinical significance. Attention on follow-up is suggested given cancer surveillance. Electronically Signed   By: Julian Hy M.D.   On: 03/16/2021 16:47    ASSESSMENT AND PLAN: This is a very pleasant 76 years old African-American female with stage IV non-small cell carcinoma,, squamous cell carcinoma diagnosed in September 2020.  She presented with extensive right-sided pleural and thoracic nodal hypermetabolic disease with no extrathoracic disease. The patient started induction treatment with systemic chemotherapy with carboplatin, paclitaxel and Keytruda status post 4 cycles with partial response after cycle #4.  This was followed by 14 cycles of maintenance treatment with single agent Keytruda discontinued secondary to disease progression. The patient is currently undergoing second line systemic chemotherapy with docetaxel 75 mg/M2 and Cyramza 10 mg/KG every 3 weeks with Neulasta support.  Status post 22 cycles.  Starting from cycle #10 her dose of docetaxel was reduced to 65 Mg/M2. The patient continues to tolerate her treatment with docetaxel and Cyramza fairly well. I recommended for her to proceed with cycle #23 today as planned. I will see her back for follow-up visit in 3 weeks for evaluation before the next  cycle of her treatment. She was advised to call immediately if she has any other concerning symptoms in the interval.  The patient voices understanding of current disease status and treatment options and is in agreement with the current care plan. All questions were answered. The patient knows to call the clinic with any problems, questions or concerns. We can certainly see the patient much sooner if necessary.   Disclaimer: This note was dictated with voice recognition software. Similar sounding words can inadvertently be transcribed and may not be corrected upon review.

## 2021-04-11 NOTE — Patient Instructions (Signed)
[facility] ONCOLOGY   Discharge Instructions: Thank you for choosing [facility] to provide your oncology and hematology care.   If you have a lab appointment with the [facility], please go directly to the [facility] and check in at the registration area.   Wear comfortable clothing and clothing appropriate for easy access to any Portacath or PICC line.   We strive to give you quality time with your provider. You may need to reschedule your appointment if you arrive late (15 or more minutes).  Arriving late affects you and other patients whose appointments are after yours.  Also, if you miss three or more appointments without notifying the office, you may be dismissed from the clinic at the providers discretion.      For prescription refill requests, have your pharmacy contact our office and allow 72 hours for refills to be completed.    Today you received the following chemotherapy and/or immunotherapy agents: Ramucirumab (Cyramza) and Docetaxel (Taxotere)      To help prevent nausea and vomiting after your treatment, we encourage you to take your nausea medication as directed.  BELOW ARE SYMPTOMS THAT SHOULD BE REPORTED IMMEDIATELY: *FEVER GREATER THAN 100.4 F (38 C) OR HIGHER *CHILLS OR SWEATING *NAUSEA AND VOMITING THAT IS NOT CONTROLLED WITH YOUR NAUSEA MEDICATION *UNUSUAL SHORTNESS OF BREATH *UNUSUAL BRUISING OR BLEEDING *URINARY PROBLEMS (pain or burning when urinating, or frequent urination) *BOWEL PROBLEMS (unusual diarrhea, constipation, pain near the anus) TENDERNESS IN MOUTH AND THROAT WITH OR WITHOUT PRESENCE OF ULCERS (sore throat, sores in mouth, or a toothache) UNUSUAL RASH, SWELLING OR PAIN  UNUSUAL VAGINAL DISCHARGE OR ITCHING   Items with * indicate a potential emergency and should be followed up as soon as possible or go to the Emergency Department if any problems should occur.  Please show the CHEMOTHERAPY ALERT CARD or  IMMUNOTHERAPY ALERT CARD at check-in to the Emergency Department and triage nurse.  Should you have questions after your visit or need to cancel or reschedule your appointment, please contact [facility]  Dept: [phone]  and follow the prompts.  Office hours are 8:00 a.m. to 4:30 p.m. Monday - Friday. Please note that voicemails left after 4:00 p.m. may not be returned until the following business day.  We are closed weekends and major holidays. You have access to a nurse at all times for urgent questions. Please call the main number to the clinic Dept: [phone] and follow the prompts.   For any non-urgent questions, you may also contact your provider using MyChart. We now offer e-Visits for anyone 27 and older to request care online for non-urgent symptoms. For details visit [email or web address].   Also download the MyChart app! Go to the app store, search "MyChart", open the app, select [facility], and log in with your MyChart username and password.  Due to Covid, a mask is required upon entering the hospital/clinic. If you do not have a mask, one will be given to you upon arrival. For doctor visits, patients may have 1 support person aged 21 or older with them. For treatment visits, patients cannot have anyone with them due to current Covid guidelines and our immunocompromised population.

## 2021-04-13 ENCOUNTER — Other Ambulatory Visit: Payer: Self-pay

## 2021-04-13 ENCOUNTER — Inpatient Hospital Stay: Payer: Medicare HMO

## 2021-04-13 VITALS — BP 129/77 | HR 95 | Temp 99.1°F | Resp 16

## 2021-04-13 DIAGNOSIS — C3492 Malignant neoplasm of unspecified part of left bronchus or lung: Secondary | ICD-10-CM | POA: Diagnosis not present

## 2021-04-13 DIAGNOSIS — I10 Essential (primary) hypertension: Secondary | ICD-10-CM | POA: Diagnosis not present

## 2021-04-13 DIAGNOSIS — J449 Chronic obstructive pulmonary disease, unspecified: Secondary | ICD-10-CM | POA: Diagnosis not present

## 2021-04-13 DIAGNOSIS — I714 Abdominal aortic aneurysm, without rupture, unspecified: Secondary | ICD-10-CM | POA: Diagnosis not present

## 2021-04-13 DIAGNOSIS — Z5111 Encounter for antineoplastic chemotherapy: Secondary | ICD-10-CM | POA: Diagnosis not present

## 2021-04-13 DIAGNOSIS — C3491 Malignant neoplasm of unspecified part of right bronchus or lung: Secondary | ICD-10-CM

## 2021-04-13 DIAGNOSIS — Z452 Encounter for adjustment and management of vascular access device: Secondary | ICD-10-CM | POA: Diagnosis not present

## 2021-04-13 DIAGNOSIS — C782 Secondary malignant neoplasm of pleura: Secondary | ICD-10-CM | POA: Diagnosis not present

## 2021-04-13 DIAGNOSIS — Z5189 Encounter for other specified aftercare: Secondary | ICD-10-CM | POA: Diagnosis not present

## 2021-04-13 DIAGNOSIS — Z5112 Encounter for antineoplastic immunotherapy: Secondary | ICD-10-CM | POA: Diagnosis not present

## 2021-04-13 MED ORDER — PEGFILGRASTIM-JMDB 6 MG/0.6ML ~~LOC~~ SOSY
6.0000 mg | PREFILLED_SYRINGE | Freq: Once | SUBCUTANEOUS | Status: AC
  Administered 2021-04-13: 6 mg via SUBCUTANEOUS
  Filled 2021-04-13: qty 0.6

## 2021-04-18 ENCOUNTER — Inpatient Hospital Stay: Payer: Medicare HMO

## 2021-04-18 ENCOUNTER — Other Ambulatory Visit: Payer: Self-pay

## 2021-04-18 ENCOUNTER — Other Ambulatory Visit: Payer: Self-pay | Admitting: Physician Assistant

## 2021-04-18 DIAGNOSIS — I10 Essential (primary) hypertension: Secondary | ICD-10-CM | POA: Diagnosis not present

## 2021-04-18 DIAGNOSIS — C3492 Malignant neoplasm of unspecified part of left bronchus or lung: Secondary | ICD-10-CM | POA: Diagnosis not present

## 2021-04-18 DIAGNOSIS — Z5189 Encounter for other specified aftercare: Secondary | ICD-10-CM | POA: Diagnosis not present

## 2021-04-18 DIAGNOSIS — Z5112 Encounter for antineoplastic immunotherapy: Secondary | ICD-10-CM | POA: Diagnosis not present

## 2021-04-18 DIAGNOSIS — I714 Abdominal aortic aneurysm, without rupture, unspecified: Secondary | ICD-10-CM | POA: Diagnosis not present

## 2021-04-18 DIAGNOSIS — Z5111 Encounter for antineoplastic chemotherapy: Secondary | ICD-10-CM | POA: Diagnosis not present

## 2021-04-18 DIAGNOSIS — Z452 Encounter for adjustment and management of vascular access device: Secondary | ICD-10-CM | POA: Diagnosis not present

## 2021-04-18 DIAGNOSIS — C782 Secondary malignant neoplasm of pleura: Secondary | ICD-10-CM | POA: Diagnosis not present

## 2021-04-18 DIAGNOSIS — E876 Hypokalemia: Secondary | ICD-10-CM

## 2021-04-18 DIAGNOSIS — J449 Chronic obstructive pulmonary disease, unspecified: Secondary | ICD-10-CM | POA: Diagnosis not present

## 2021-04-18 DIAGNOSIS — Z95828 Presence of other vascular implants and grafts: Secondary | ICD-10-CM

## 2021-04-18 DIAGNOSIS — C3491 Malignant neoplasm of unspecified part of right bronchus or lung: Secondary | ICD-10-CM

## 2021-04-18 LAB — CBC WITH DIFFERENTIAL (CANCER CENTER ONLY)
Abs Immature Granulocytes: 0.02 10*3/uL (ref 0.00–0.07)
Basophils Absolute: 0 10*3/uL (ref 0.0–0.1)
Basophils Relative: 2 %
Eosinophils Absolute: 0 10*3/uL (ref 0.0–0.5)
Eosinophils Relative: 0 %
HCT: 31 % — ABNORMAL LOW (ref 36.0–46.0)
Hemoglobin: 10.1 g/dL — ABNORMAL LOW (ref 12.0–15.0)
Immature Granulocytes: 1 %
Lymphocytes Relative: 61 %
Lymphs Abs: 1.1 10*3/uL (ref 0.7–4.0)
MCH: 33.1 pg (ref 26.0–34.0)
MCHC: 32.6 g/dL (ref 30.0–36.0)
MCV: 101.6 fL — ABNORMAL HIGH (ref 80.0–100.0)
Monocytes Absolute: 0.2 10*3/uL (ref 0.1–1.0)
Monocytes Relative: 9 %
Neutro Abs: 0.5 10*3/uL — ABNORMAL LOW (ref 1.7–7.7)
Neutrophils Relative %: 27 %
Platelet Count: 74 10*3/uL — ABNORMAL LOW (ref 150–400)
RBC: 3.05 MIL/uL — ABNORMAL LOW (ref 3.87–5.11)
RDW: 17.5 % — ABNORMAL HIGH (ref 11.5–15.5)
WBC Count: 1.8 10*3/uL — ABNORMAL LOW (ref 4.0–10.5)
nRBC: 0 % (ref 0.0–0.2)

## 2021-04-18 LAB — CMP (CANCER CENTER ONLY)
ALT: 6 U/L (ref 0–44)
AST: 13 U/L — ABNORMAL LOW (ref 15–41)
Albumin: 3.3 g/dL — ABNORMAL LOW (ref 3.5–5.0)
Alkaline Phosphatase: 52 U/L (ref 38–126)
Anion gap: 6 (ref 5–15)
BUN: 18 mg/dL (ref 8–23)
CO2: 33 mmol/L — ABNORMAL HIGH (ref 22–32)
Calcium: 8.9 mg/dL (ref 8.9–10.3)
Chloride: 99 mmol/L (ref 98–111)
Creatinine: 0.65 mg/dL (ref 0.44–1.00)
GFR, Estimated: >60 mL/min (ref >60)
Glucose, Bld: 85 mg/dL (ref 70–99)
Potassium: 3 mmol/L — ABNORMAL LOW (ref 3.5–5.1)
Sodium: 138 mmol/L (ref 135–145)
Total Bilirubin: 1.2 mg/dL (ref 0.3–1.2)
Total Protein: 6 g/dL — ABNORMAL LOW (ref 6.5–8.1)

## 2021-04-18 MED ORDER — HEPARIN SOD (PORK) LOCK FLUSH 100 UNIT/ML IV SOLN
500.0000 [IU] | Freq: Once | INTRAVENOUS | Status: AC | PRN
  Administered 2021-04-18: 09:00:00 500 [IU]

## 2021-04-18 MED ORDER — POTASSIUM CHLORIDE CRYS ER 20 MEQ PO TBCR: 20.0000 meq | EXTENDED_RELEASE_TABLET | Freq: Two times a day (BID) | ORAL | 0 refills | Status: DC

## 2021-04-18 MED ORDER — SODIUM CHLORIDE 0.9% FLUSH
10.0000 mL | INTRAVENOUS | Status: DC | PRN
  Administered 2021-04-18: 09:00:00 10 mL

## 2021-04-18 NOTE — Progress Notes (Signed)
I called the patient to let her know I sent more potassium supplements to her pharmacy. She expressed understanding.

## 2021-04-25 ENCOUNTER — Inpatient Hospital Stay: Payer: Medicare HMO | Attending: Internal Medicine

## 2021-04-25 ENCOUNTER — Other Ambulatory Visit: Payer: Self-pay

## 2021-04-25 DIAGNOSIS — C3491 Malignant neoplasm of unspecified part of right bronchus or lung: Secondary | ICD-10-CM

## 2021-04-25 DIAGNOSIS — Z5111 Encounter for antineoplastic chemotherapy: Secondary | ICD-10-CM | POA: Diagnosis not present

## 2021-04-25 DIAGNOSIS — Z79899 Other long term (current) drug therapy: Secondary | ICD-10-CM | POA: Diagnosis not present

## 2021-04-25 DIAGNOSIS — G4730 Sleep apnea, unspecified: Secondary | ICD-10-CM | POA: Diagnosis not present

## 2021-04-25 DIAGNOSIS — J449 Chronic obstructive pulmonary disease, unspecified: Secondary | ICD-10-CM | POA: Diagnosis not present

## 2021-04-25 DIAGNOSIS — I10 Essential (primary) hypertension: Secondary | ICD-10-CM | POA: Insufficient documentation

## 2021-04-25 DIAGNOSIS — C3412 Malignant neoplasm of upper lobe, left bronchus or lung: Secondary | ICD-10-CM | POA: Insufficient documentation

## 2021-04-25 DIAGNOSIS — Z86718 Personal history of other venous thrombosis and embolism: Secondary | ICD-10-CM | POA: Diagnosis not present

## 2021-04-25 DIAGNOSIS — F17210 Nicotine dependence, cigarettes, uncomplicated: Secondary | ICD-10-CM | POA: Diagnosis not present

## 2021-04-25 DIAGNOSIS — I714 Abdominal aortic aneurysm, without rupture, unspecified: Secondary | ICD-10-CM | POA: Insufficient documentation

## 2021-04-25 DIAGNOSIS — Z95828 Presence of other vascular implants and grafts: Secondary | ICD-10-CM

## 2021-04-25 LAB — CMP (CANCER CENTER ONLY)
ALT: 5 U/L (ref 0–44)
AST: 12 U/L — ABNORMAL LOW (ref 15–41)
Albumin: 3.4 g/dL — ABNORMAL LOW (ref 3.5–5.0)
Alkaline Phosphatase: 93 U/L (ref 38–126)
Anion gap: 4 — ABNORMAL LOW (ref 5–15)
BUN: 20 mg/dL (ref 8–23)
CO2: 33 mmol/L — ABNORMAL HIGH (ref 22–32)
Calcium: 9 mg/dL (ref 8.9–10.3)
Chloride: 102 mmol/L (ref 98–111)
Creatinine: 0.75 mg/dL (ref 0.44–1.00)
GFR, Estimated: >60 mL/min (ref >60)
Glucose, Bld: 79 mg/dL (ref 70–99)
Potassium: 3.4 mmol/L — ABNORMAL LOW (ref 3.5–5.1)
Sodium: 139 mmol/L (ref 135–145)
Total Bilirubin: 0.6 mg/dL (ref 0.3–1.2)
Total Protein: 6 g/dL — ABNORMAL LOW (ref 6.5–8.1)

## 2021-04-25 LAB — CBC WITH DIFFERENTIAL (CANCER CENTER ONLY)
Abs Immature Granulocytes: 0.36 10*3/uL — ABNORMAL HIGH (ref 0.00–0.07)
Basophils Absolute: 0.1 10*3/uL (ref 0.0–0.1)
Basophils Relative: 0 %
Eosinophils Absolute: 0 10*3/uL (ref 0.0–0.5)
Eosinophils Relative: 0 %
HCT: 31.7 % — ABNORMAL LOW (ref 36.0–46.0)
Hemoglobin: 10.1 g/dL — ABNORMAL LOW (ref 12.0–15.0)
Immature Granulocytes: 2 %
Lymphocytes Relative: 14 %
Lymphs Abs: 2.7 10*3/uL (ref 0.7–4.0)
MCH: 32.8 pg (ref 26.0–34.0)
MCHC: 31.9 g/dL (ref 30.0–36.0)
MCV: 102.9 fL — ABNORMAL HIGH (ref 80.0–100.0)
Monocytes Absolute: 0.6 10*3/uL (ref 0.1–1.0)
Monocytes Relative: 3 %
Neutro Abs: 15.8 10*3/uL — ABNORMAL HIGH (ref 1.7–7.7)
Neutrophils Relative %: 81 %
Platelet Count: 143 10*3/uL — ABNORMAL LOW (ref 150–400)
RBC: 3.08 MIL/uL — ABNORMAL LOW (ref 3.87–5.11)
RDW: 18.3 % — ABNORMAL HIGH (ref 11.5–15.5)
WBC Count: 19.6 10*3/uL — ABNORMAL HIGH (ref 4.0–10.5)
nRBC: 0.2 % (ref 0.0–0.2)

## 2021-04-25 MED ORDER — SODIUM CHLORIDE 0.9% FLUSH
10.0000 mL | INTRAVENOUS | Status: DC | PRN
  Administered 2021-04-25: 10 mL

## 2021-04-25 MED ORDER — HEPARIN SOD (PORK) LOCK FLUSH 100 UNIT/ML IV SOLN
500.0000 [IU] | Freq: Once | INTRAVENOUS | Status: AC | PRN
  Administered 2021-04-25: 500 [IU]

## 2021-05-01 ENCOUNTER — Other Ambulatory Visit: Payer: Self-pay | Admitting: Student in an Organized Health Care Education/Training Program

## 2021-05-01 NOTE — Telephone Encounter (Signed)
Next appt scheduled 05/07/21 with PCP.

## 2021-05-02 ENCOUNTER — Other Ambulatory Visit: Payer: Self-pay

## 2021-05-02 ENCOUNTER — Inpatient Hospital Stay: Payer: Medicare HMO

## 2021-05-02 ENCOUNTER — Inpatient Hospital Stay: Payer: Medicare HMO | Admitting: Internal Medicine

## 2021-05-02 VITALS — BP 122/71 | HR 96 | Temp 96.5°F | Resp 19 | Ht 61.0 in | Wt 139.2 lb

## 2021-05-02 DIAGNOSIS — I714 Abdominal aortic aneurysm, without rupture, unspecified: Secondary | ICD-10-CM | POA: Diagnosis not present

## 2021-05-02 DIAGNOSIS — I10 Essential (primary) hypertension: Secondary | ICD-10-CM | POA: Diagnosis not present

## 2021-05-02 DIAGNOSIS — C3491 Malignant neoplasm of unspecified part of right bronchus or lung: Secondary | ICD-10-CM

## 2021-05-02 DIAGNOSIS — Z5111 Encounter for antineoplastic chemotherapy: Secondary | ICD-10-CM | POA: Diagnosis not present

## 2021-05-02 DIAGNOSIS — Z95828 Presence of other vascular implants and grafts: Secondary | ICD-10-CM

## 2021-05-02 DIAGNOSIS — J449 Chronic obstructive pulmonary disease, unspecified: Secondary | ICD-10-CM | POA: Diagnosis not present

## 2021-05-02 DIAGNOSIS — Z5112 Encounter for antineoplastic immunotherapy: Secondary | ICD-10-CM

## 2021-05-02 DIAGNOSIS — C3412 Malignant neoplasm of upper lobe, left bronchus or lung: Secondary | ICD-10-CM | POA: Diagnosis not present

## 2021-05-02 DIAGNOSIS — Z79899 Other long term (current) drug therapy: Secondary | ICD-10-CM | POA: Diagnosis not present

## 2021-05-02 DIAGNOSIS — G4730 Sleep apnea, unspecified: Secondary | ICD-10-CM | POA: Diagnosis not present

## 2021-05-02 DIAGNOSIS — Z86718 Personal history of other venous thrombosis and embolism: Secondary | ICD-10-CM | POA: Diagnosis not present

## 2021-05-02 DIAGNOSIS — F17210 Nicotine dependence, cigarettes, uncomplicated: Secondary | ICD-10-CM | POA: Diagnosis not present

## 2021-05-02 LAB — CBC WITH DIFFERENTIAL (CANCER CENTER ONLY)
Abs Immature Granulocytes: 0.41 10*3/uL — ABNORMAL HIGH (ref 0.00–0.07)
Basophils Absolute: 0.1 10*3/uL (ref 0.0–0.1)
Basophils Relative: 0 %
Eosinophils Absolute: 0 10*3/uL (ref 0.0–0.5)
Eosinophils Relative: 0 %
HCT: 34.1 % — ABNORMAL LOW (ref 36.0–46.0)
Hemoglobin: 11.1 g/dL — ABNORMAL LOW (ref 12.0–15.0)
Immature Granulocytes: 2 %
Lymphocytes Relative: 5 %
Lymphs Abs: 1 10*3/uL (ref 0.7–4.0)
MCH: 33 pg (ref 26.0–34.0)
MCHC: 32.6 g/dL (ref 30.0–36.0)
MCV: 101.5 fL — ABNORMAL HIGH (ref 80.0–100.0)
Monocytes Absolute: 0.3 10*3/uL (ref 0.1–1.0)
Monocytes Relative: 1 %
Neutro Abs: 18.9 10*3/uL — ABNORMAL HIGH (ref 1.7–7.7)
Neutrophils Relative %: 92 %
Platelet Count: 204 10*3/uL (ref 150–400)
RBC: 3.36 MIL/uL — ABNORMAL LOW (ref 3.87–5.11)
RDW: 18.6 % — ABNORMAL HIGH (ref 11.5–15.5)
WBC Count: 20.6 10*3/uL — ABNORMAL HIGH (ref 4.0–10.5)
nRBC: 0 % (ref 0.0–0.2)

## 2021-05-02 LAB — CMP (CANCER CENTER ONLY)
ALT: 6 U/L (ref 0–44)
AST: 12 U/L — ABNORMAL LOW (ref 15–41)
Albumin: 3.8 g/dL (ref 3.5–5.0)
Alkaline Phosphatase: 65 U/L (ref 38–126)
Anion gap: 10 (ref 5–15)
BUN: 32 mg/dL — ABNORMAL HIGH (ref 8–23)
CO2: 30 mmol/L (ref 22–32)
Calcium: 9.6 mg/dL (ref 8.9–10.3)
Chloride: 99 mmol/L (ref 98–111)
Creatinine: 0.83 mg/dL (ref 0.44–1.00)
GFR, Estimated: >60 mL/min (ref >60)
Glucose, Bld: 139 mg/dL — ABNORMAL HIGH (ref 70–99)
Potassium: 3.3 mmol/L — ABNORMAL LOW (ref 3.5–5.1)
Sodium: 139 mmol/L (ref 135–145)
Total Bilirubin: 0.6 mg/dL (ref 0.3–1.2)
Total Protein: 6.7 g/dL (ref 6.5–8.1)

## 2021-05-02 LAB — TOTAL PROTEIN, URINE DIPSTICK

## 2021-05-02 MED ORDER — SODIUM CHLORIDE 0.9 % IV SOLN
65.0000 mg/m2 | Freq: Once | INTRAVENOUS | Status: AC
  Administered 2021-05-02: 110 mg via INTRAVENOUS
  Filled 2021-05-02: qty 11

## 2021-05-02 MED ORDER — SODIUM CHLORIDE 0.9 % IV SOLN
10.0000 mg/kg | Freq: Once | INTRAVENOUS | Status: AC
  Administered 2021-05-02: 600 mg via INTRAVENOUS
  Filled 2021-05-02: qty 50

## 2021-05-02 MED ORDER — ACETAMINOPHEN 325 MG PO TABS
650.0000 mg | ORAL_TABLET | Freq: Once | ORAL | Status: AC
  Administered 2021-05-02: 650 mg via ORAL
  Filled 2021-05-02: qty 2

## 2021-05-02 MED ORDER — DIPHENHYDRAMINE HCL 50 MG/ML IJ SOLN
50.0000 mg | Freq: Once | INTRAMUSCULAR | Status: AC
  Administered 2021-05-02: 50 mg via INTRAVENOUS
  Filled 2021-05-02: qty 1

## 2021-05-02 MED ORDER — SODIUM CHLORIDE 0.9% FLUSH
10.0000 mL | INTRAVENOUS | Status: DC | PRN
  Administered 2021-05-02: 10 mL

## 2021-05-02 MED ORDER — HEPARIN SOD (PORK) LOCK FLUSH 100 UNIT/ML IV SOLN
500.0000 [IU] | Freq: Once | INTRAVENOUS | Status: AC | PRN
  Administered 2021-05-02: 500 [IU]

## 2021-05-02 MED ORDER — SODIUM CHLORIDE 0.9 % IV SOLN: Freq: Once | INTRAVENOUS | Status: AC

## 2021-05-02 MED ORDER — SODIUM CHLORIDE 0.9 % IV SOLN
10.0000 mg | Freq: Once | INTRAVENOUS | Status: AC
  Administered 2021-05-02: 10 mg via INTRAVENOUS
  Filled 2021-05-02: qty 10

## 2021-05-02 NOTE — Patient Instructions (Signed)
[facility] ONCOLOGY  Discharge Instructions: Thank you for choosing [facility] to provide your oncology and hematology care.   If you have a lab appointment with the [facility], please go directly to the [facility] and check in at the registration area.   Wear comfortable clothing and clothing appropriate for easy access to any Portacath or PICC line.   We strive to give you quality time with your provider. You may need to reschedule your appointment if you arrive late (15 or more minutes).  Arriving late affects you and other patients whose appointments are after yours.  Also, if you miss three or more appointments without notifying the office, you may be dismissed from the clinic at the providers discretion.      For prescription refill requests, have your pharmacy contact our office and allow 72 hours for refills to be completed.    Today you received the following chemotherapy and/or immunotherapy agents: Ramucirumab, Docetaxel.       To help prevent nausea and vomiting after your treatment, we encourage you to take your nausea medication as directed.  BELOW ARE SYMPTOMS THAT SHOULD BE REPORTED IMMEDIATELY: *FEVER GREATER THAN 100.4 F (38 C) OR HIGHER *CHILLS OR SWEATING *NAUSEA AND VOMITING THAT IS NOT CONTROLLED WITH YOUR NAUSEA MEDICATION *UNUSUAL SHORTNESS OF BREATH *UNUSUAL BRUISING OR BLEEDING *URINARY PROBLEMS (pain or burning when urinating, or frequent urination) *BOWEL PROBLEMS (unusual diarrhea, constipation, pain near the anus) TENDERNESS IN MOUTH AND THROAT WITH OR WITHOUT PRESENCE OF ULCERS (sore throat, sores in mouth, or a toothache) UNUSUAL RASH, SWELLING OR PAIN  UNUSUAL VAGINAL DISCHARGE OR ITCHING   Items with * indicate a potential emergency and should be followed up as soon as possible or go to the Emergency Department if any problems should occur.  Please show the CHEMOTHERAPY ALERT CARD or IMMUNOTHERAPY ALERT CARD  at check-in to the Emergency Department and triage nurse.  Should you have questions after your visit or need to cancel or reschedule your appointment, please contact [facility]  Dept: [phone]  and follow the prompts.  Office hours are 8:00 a.m. to 4:30 p.m. Monday - Friday. Please note that voicemails left after 4:00 p.m. may not be returned until the following business day.  We are closed weekends and major holidays. You have access to a nurse at all times for urgent questions. Please call the main number to the clinic Dept: [phone] and follow the prompts.   For any non-urgent questions, you may also contact your provider using MyChart. We now offer e-Visits for anyone 71 and older to request care online for non-urgent symptoms. For details visit [email or web address].   Also download the MyChart app! Go to the app store, search "MyChart", open the app, select [facility], and log in with your MyChart username and password.  Due to Covid, a mask is required upon entering the hospital/clinic. If you do not have a mask, one will be given to you upon arrival. For doctor visits, patients may have 1 support person aged 76 or older with them. For treatment visits, patients cannot have anyone with them due to current Covid guidelines and our immunocompromised population.

## 2021-05-02 NOTE — Progress Notes (Signed)
[facility] Telephone:([phone]) [phone]   Fax:([phone]) [phone]  OFFICE PROGRESS NOTE  [name], MD [place] [address]  DIAGNOSIS: Stage IV non-small cell lung cancer, squamous cell carcinoma. She presented with right upper lobe lung mass in addition to pleural-based metastasis and mediastinal lymphadenopathy. She was diagnosed in September 2020.    PRIOR THERAPY: Chemotherapy with carboplatin for an AUC of 5, paclitaxel 175 mg/m, and Keytruda 200 mg IV every 3 weeks with Neulasta support. Last dose 12/08/19. Status post 18 cycles.  Starting from cycle #5 was on maintenance treatment with single agent Keytruda every 3 weeks. This was discontinued due to evidence of disease progression.    CURRENT THERAPY: Palliative systemic chemotherapy with docetaxel 75 mg/m2 and Cyramza 10 mg/kg IV every 3 weeks with Neulasta support. First dose on 01/04/20.  Status post 23 cycles.  Starting from cycle #10 docetaxel was reduced to 65 Mg/M2  INTERVAL HISTORY: Debra Rodgers 76 y.o. female returns to the clinic today for follow-up visit.  The patient is feeling fine today with no concerning complaints except for mild swelling of the lower extremities.  She took her dexamethasone premedication earlier today.  She denied having any current chest pain but has shortness of breath with exertion with no cough or hemoptysis.  She denied having any fever or chills.  She has no nausea, vomiting, diarrhea or constipation.  She denied having any headache or visual changes.  She is here today for evaluation before starting cycle #24 of her treatment.    MEDICAL HISTORY: Past Medical History:  Diagnosis Date   AAA (abdominal aortic aneurysm)    COPD (chronic obstructive pulmonary disease) (HCC)    Depression    DVT (deep venous thrombosis) ([place]) 07/12/2019   Essential hypertension    Headache    History of migraine headaches    lung ca dx'd 10/2018   Sleep apnea     Tobacco use disorder     ALLERGIES:  is allergic to codeine sulfate and pantoprazole sodium.  MEDICATIONS:  Current Outpatient Medications  Medication Sig Dispense Refill   acetaminophen (TYLENOL) 500 MG tablet Take 500 mg by mouth every 6 (six) hours as needed for moderate pain.     albuterol (VENTOLIN HFA) 108 (90 Base) MCG/ACT inhaler Inhale 2 puffs into the lungs every 6 (six) hours as needed for wheezing or shortness of breath. 4 each 0   amLODipine (NORVASC) 10 MG tablet TAKE 1 TABLET EVERY DAY 90 tablet 3   atorvastatin (LIPITOR) 20 MG tablet TAKE 1 TABLET EVERY DAY 90 tablet 3   chlorthalidone (HYGROTON) 50 MG tablet TAKE 1 TABLET EVERY DAY (Patient taking differently: Take 50 mg by mouth daily.) 90 tablet 3   dexamethasone (DECADRON) 4 MG tablet TAKE 2 TABLETS TWICE DAILY THE DAY BEFORE, THE DAY OF, AND THE DAY AFTER CHEMOTHERAPY 51 tablet 2   famotidine (PEPCID) 20 MG tablet Take 20 mg by mouth daily.      FLUoxetine (PROZAC) 20 MG capsule TAKE 1 CAPSULE EVERY DAY (Patient taking differently: Take 20 mg by mouth daily.) 90 capsule 3   fluticasone (FLONASE) 50 MCG/ACT nasal spray Place 1 spray into both nostrils daily. 11.1 mL 0   gabapentin (NEURONTIN) 100 MG capsule Take 1 capsule (100 mg total) by mouth 2 (two) times daily. 90 capsule 2   lidocaine-prilocaine (EMLA) cream Apply 1 application topically as needed. 30 g 1   magnesium oxide (MAG-OX)  400 (240 Mg) MG tablet Take 1 tablet (400 mg total) by mouth daily. 30 tablet 2   polyvinyl alcohol (LIQUIFILM TEARS) 1.4 % ophthalmic solution Place 1 drop into both eyes as needed for dry eyes.     potassium chloride SA (KLOR-CON M) 20 MEQ tablet Take 1 tablet (20 mEq total) by mouth 2 (two) times daily. 14 tablet 0   tiotropium (SPIRIVA HANDIHALER) 18 MCG inhalation capsule Place 1 capsule (18 mcg total) into inhaler and inhale daily. 30 capsule 2   No current facility-administered medications for this visit.    SURGICAL HISTORY:   Past Surgical History:  Procedure Laterality Date   ABDOMINAL HYSTERECTOMY     CHOLECYSTECTOMY     IR IMAGING GUIDED PORT INSERTION  02/24/2019   ROTATOR CUFF REPAIR  3/04   VIDEO BRONCHOSCOPY WITH ENDOBRONCHIAL ULTRASOUND Right 11/25/2018   Procedure: VIDEO BRONCHOSCOPY WITH ENDOBRONCHIAL ULTRASOUND;  Surgeon: Collene Gobble, MD;  Location: [facility] OR;  Service: Thoracic;  Laterality: Right;    REVIEW OF SYSTEMS:  A comprehensive review of systems was negative except for: Constitutional: positive for fatigue Respiratory: positive for dyspnea on exertion Musculoskeletal: positive for arthralgias   PHYSICAL EXAMINATION: General appearance: alert, cooperative, fatigued, and no distress Head: Normocephalic, without obvious abnormality, atraumatic Neck: no adenopathy, no JVD, supple, symmetrical, trachea midline, and thyroid not enlarged, symmetric, no tenderness/mass/nodules Lymph nodes: Cervical, supraclavicular, and axillary nodes normal. Resp: clear to auscultation bilaterally Back: symmetric, no curvature. ROM normal. No CVA tenderness. Cardio: regular rate and rhythm, S1, S2 normal, no murmur, click, rub or gallop GI: soft, non-tender; bowel sounds normal; no masses,  no organomegaly Extremities: edema 1+ edema  ECOG PERFORMANCE STATUS: 1 - Symptomatic but completely ambulatory  Blood pressure 122/71, pulse 96, temperature (!) 96.5 F (35.8 C), temperature source Tympanic, resp. rate 19, height 5' 1"  (1.549 m), weight 139 lb 3.2 oz (63.1 kg), SpO2 99 %.  LABORATORY DATA: Lab Results  Component Value Date   WBC 20.6 (H) 05/02/2021   HGB 11.1 (L) 05/02/2021   HCT 34.1 (L) 05/02/2021   MCV 101.5 (H) 05/02/2021   PLT 204 05/02/2021      Chemistry      Component Value Date/Time   NA 139 04/25/2021 0920   NA 139 07/21/2017 0958   K 3.4 (L) 04/25/2021 0920   CL 102 04/25/2021 0920   CO2 33 (H) 04/25/2021 0920   BUN 20 04/25/2021 0920   BUN 26 07/21/2017 0958   CREATININE 0.75  04/25/2021 0920   CREATININE 0.79 09/03/2013 1540      Component Value Date/Time   CALCIUM 9.0 04/25/2021 0920   ALKPHOS 93 04/25/2021 0920   AST 12 (L) 04/25/2021 0920   ALT 5 04/25/2021 0920   BILITOT 0.6 04/25/2021 0920       RADIOGRAPHIC STUDIES: No results found.  ASSESSMENT AND PLAN: This is a very pleasant 76 years old African-American female with stage IV non-small cell carcinoma,, squamous cell carcinoma diagnosed in September 2020.  She presented with extensive right-sided pleural and thoracic nodal hypermetabolic disease with no extrathoracic disease. The patient started induction treatment with systemic chemotherapy with carboplatin, paclitaxel and Keytruda status post 4 cycles with partial response after cycle #4.  This was followed by 14 cycles of maintenance treatment with single agent Keytruda discontinued secondary to disease progression. The patient is currently undergoing second line systemic chemotherapy with docetaxel 75 mg/M2 and Cyramza 10 mg/KG every 3 weeks with Neulasta support.  Status post 36  cycles.  Starting from cycle #10 her dose of docetaxel was reduced to 65 Mg/M2. The patient continues to tolerate her treatment well with no concerning adverse effect except for mild fatigue as well as the paronychia and skin changes. I recommended for her to proceed with cycle #24 today. I will see her back for follow-up visit in 3 weeks for evaluation before the next cycle of her treatment. She was advised to call immediately if she has any other concerning symptoms in the interval. The patient voices understanding of current disease status and treatment options and is in agreement with the current care plan. All questions were answered. The patient knows to call the clinic with any problems, questions or concerns. We can certainly see the patient much sooner if necessary.   Disclaimer: This note was dictated with voice recognition software. Similar sounding words can  inadvertently be transcribed and may not be corrected upon review.

## 2021-05-04 ENCOUNTER — Other Ambulatory Visit: Payer: Self-pay

## 2021-05-04 ENCOUNTER — Inpatient Hospital Stay: Payer: Medicare HMO

## 2021-05-04 VITALS — BP 123/76 | HR 86 | Temp 98.3°F | Resp 18

## 2021-05-04 DIAGNOSIS — Z86718 Personal history of other venous thrombosis and embolism: Secondary | ICD-10-CM | POA: Diagnosis not present

## 2021-05-04 DIAGNOSIS — C3491 Malignant neoplasm of unspecified part of right bronchus or lung: Secondary | ICD-10-CM

## 2021-05-04 DIAGNOSIS — Z5111 Encounter for antineoplastic chemotherapy: Secondary | ICD-10-CM | POA: Diagnosis not present

## 2021-05-04 DIAGNOSIS — I10 Essential (primary) hypertension: Secondary | ICD-10-CM | POA: Diagnosis not present

## 2021-05-04 DIAGNOSIS — C3412 Malignant neoplasm of upper lobe, left bronchus or lung: Secondary | ICD-10-CM | POA: Diagnosis not present

## 2021-05-04 DIAGNOSIS — G4730 Sleep apnea, unspecified: Secondary | ICD-10-CM | POA: Diagnosis not present

## 2021-05-04 DIAGNOSIS — I714 Abdominal aortic aneurysm, without rupture, unspecified: Secondary | ICD-10-CM | POA: Diagnosis not present

## 2021-05-04 DIAGNOSIS — Z79899 Other long term (current) drug therapy: Secondary | ICD-10-CM | POA: Diagnosis not present

## 2021-05-04 DIAGNOSIS — F17210 Nicotine dependence, cigarettes, uncomplicated: Secondary | ICD-10-CM | POA: Diagnosis not present

## 2021-05-04 DIAGNOSIS — J449 Chronic obstructive pulmonary disease, unspecified: Secondary | ICD-10-CM | POA: Diagnosis not present

## 2021-05-04 MED ORDER — PEGFILGRASTIM-JMDB 6 MG/0.6ML ~~LOC~~ SOSY
6.0000 mg | PREFILLED_SYRINGE | Freq: Once | SUBCUTANEOUS | Status: AC
  Administered 2021-05-04: 6 mg via SUBCUTANEOUS
  Filled 2021-05-04: qty 0.6

## 2021-05-04 NOTE — Patient Instructions (Signed)

## 2021-05-07 ENCOUNTER — Encounter: Payer: Self-pay | Admitting: Student in an Organized Health Care Education/Training Program

## 2021-05-07 ENCOUNTER — Ambulatory Visit (HOSPITAL_COMMUNITY)
Admission: RE | Admit: 2021-05-07 | Discharge: 2021-05-07 | Disposition: A | Discharge status: Home / Self Care | Payer: Medicare HMO | Source: Ambulatory Visit | Attending: Internal Medicine | Admitting: Internal Medicine

## 2021-05-07 ENCOUNTER — Ambulatory Visit (INDEPENDENT_AMBULATORY_CARE_PROVIDER_SITE_OTHER): Payer: Medicare HMO | Admitting: Student in an Organized Health Care Education/Training Program

## 2021-05-07 VITALS — BP 134/81 | HR 92 | Temp 98.9°F | Ht 61.0 in | Wt 138.7 lb

## 2021-05-07 DIAGNOSIS — E876 Hypokalemia: Secondary | ICD-10-CM

## 2021-05-07 DIAGNOSIS — J439 Emphysema, unspecified: Secondary | ICD-10-CM

## 2021-05-07 DIAGNOSIS — R0789 Other chest pain: Secondary | ICD-10-CM | POA: Insufficient documentation

## 2021-05-07 DIAGNOSIS — R1013 Epigastric pain: Secondary | ICD-10-CM

## 2021-05-07 DIAGNOSIS — R109 Unspecified abdominal pain: Secondary | ICD-10-CM | POA: Insufficient documentation

## 2021-05-07 LAB — CBC WITH DIFFERENTIAL/PLATELET
Abs Immature Granulocytes: 0.51 10*3/uL — ABNORMAL HIGH (ref 0.00–0.07)
Basophils Absolute: 0 10*3/uL (ref 0.0–0.1)
Basophils Relative: 0 %
Eosinophils Absolute: 0 10*3/uL (ref 0.0–0.5)
Eosinophils Relative: 0 %
HCT: 34.9 % — ABNORMAL LOW (ref 36.0–46.0)
Hemoglobin: 11.2 g/dL — ABNORMAL LOW (ref 12.0–15.0)
Immature Granulocytes: 7 %
Lymphocytes Relative: 16 %
Lymphs Abs: 1.2 10*3/uL (ref 0.7–4.0)
MCH: 33.2 pg (ref 26.0–34.0)
MCHC: 32.1 g/dL (ref 30.0–36.0)
MCV: 103.6 fL — ABNORMAL HIGH (ref 80.0–100.0)
Monocytes Absolute: 0.1 10*3/uL (ref 0.1–1.0)
Monocytes Relative: 1 %
Neutro Abs: 5.5 10*3/uL (ref 1.7–7.7)
Neutrophils Relative %: 76 %
Platelets: 89 10*3/uL — ABNORMAL LOW (ref 150–400)
RBC: 3.37 MIL/uL — ABNORMAL LOW (ref 3.87–5.11)
RDW: 18.3 % — ABNORMAL HIGH (ref 11.5–15.5)
Smear Review: DECREASED
WBC: 7.3 10*3/uL (ref 4.0–10.5)
nRBC: 0 % (ref 0.0–0.2)

## 2021-05-07 LAB — COMPREHENSIVE METABOLIC PANEL
ALT: 10 U/L (ref 0–44)
AST: 19 U/L (ref 15–41)
Albumin: 3 g/dL — ABNORMAL LOW (ref 3.5–5.0)
Alkaline Phosphatase: 66 U/L (ref 38–126)
Anion gap: 9 (ref 5–15)
BUN: 20 mg/dL (ref 8–23)
CO2: 30 mmol/L (ref 22–32)
Calcium: 8.9 mg/dL (ref 8.9–10.3)
Chloride: 99 mmol/L (ref 98–111)
Creatinine, Ser: 0.66 mg/dL (ref 0.44–1.00)
GFR, Estimated: >60 mL/min (ref >60)
Glucose, Bld: 94 mg/dL (ref 70–99)
Potassium: 2.9 mmol/L — ABNORMAL LOW (ref 3.5–5.1)
Sodium: 138 mmol/L (ref 135–145)
Total Bilirubin: 1.3 mg/dL — ABNORMAL HIGH (ref 0.3–1.2)
Total Protein: 5.6 g/dL — ABNORMAL LOW (ref 6.5–8.1)

## 2021-05-07 LAB — MAGNESIUM: Magnesium: 1.8 mg/dL (ref 1.7–2.4)

## 2021-05-07 LAB — LIPASE, BLOOD: Lipase: 43 U/L (ref 11–51)

## 2021-05-07 MED ORDER — POTASSIUM CHLORIDE CRYS ER 20 MEQ PO TBCR: 20.0000 meq | EXTENDED_RELEASE_TABLET | Freq: Two times a day (BID) | ORAL | 0 refills | Status: DC

## 2021-05-07 NOTE — Addendum Note (Signed)
Addended by: Lalla Brothers T on: 05/07/2021 11:13 AM   Modules accepted: Orders

## 2021-05-07 NOTE — Addendum Note (Signed)
Addended by: Lalla Brothers T on: 05/07/2021 11:00 AM   Modules accepted: Orders

## 2021-05-07 NOTE — Assessment & Plan Note (Signed)
Low risk symptom features in the office, at risk for ischemic disease given her comorbidities. EKG in the office is reassuring with no ST changes. I think this is likely referred pain from the abdomen which we are evaluating as below.

## 2021-05-07 NOTE — Assessment & Plan Note (Signed)
Progressively worsening abdominal pain with food aversion, nausea, and diarrhea in the setting of palliative chemotherapy for stage 4 lung cancer. She is at risk for bowel obstruction, also has a history of a low risk AAA that we are monitoring. Abdominal exam is abnormal, has tenderness at the epigastric where there is a palpable mass. This is probably a chemotherapy related symptom, but because it is worsening, is function limiting, and has an abnormal exam, I will collect labs today and order a CT abdomen to rule out bowel obstruction and changes in the AAA.

## 2021-05-07 NOTE — Progress Notes (Signed)
° °  Assessment and Plan:  See Encounters tab for problem-based medical decision making.   __________________________________________________________  HPI:   76 year old person with stage 4 SCC of the lung here for acute visit to evaluate abdominal pain. She has been on a palliative course of chemotherapy since October 2021 with dose-reduced Docetaxel due to side effects. She had last infusion on Wednesday, and since then has had worsening abdominal pain. She reports about 5 BMs daily, no blood or melena. Unable to eat much food, worsens the pain, has nausea but no vomiting. She has tightness in her upper abdomen which radiates into her chest. She is feeling weak, struggles to move out of bed, and stuggles to go to the bathroom on her own. No falls, lives with her husband but is alone during the day. Weight is stable since our last visit in November. She is very short of breath with any movement and reports coughing up thicker mucus than usual.  __________________________________________________________  Problem List: Patient Active Problem List   Diagnosis Date Noted   Stage IV squamous cell carcinoma of right lung ([facility]) 12/02/2018    Priority: High   Abdominal aortic aneurysm (AAA) 35 to 39 mm in diameter 11/05/2018    Priority: High   COPD (chronic obstructive pulmonary disease) ([facility]) 09/23/2016    Priority: High   Essential hypertension 04/17/2015    Priority: High   Major depressive disorder, recurrent episode ([facility]) 04/17/2015    Priority: High   Cancer associated pain 12/30/2018    Priority: Medium    Glucose intolerance 10/02/2015    Priority: Medium    Adenomatous colon polyp 10/02/2015    Priority: Medium    Osteoarthritis cervical spine 05/26/2013    Priority: Medium    GERD 06/25/2007    Priority: Medium    Goals of care, counseling/discussion 12/02/2018    Priority: Low   BPPV (benign paroxysmal positional vertigo) 12/02/2016    Priority: Low   OSA (obstructive  sleep apnea) 10/02/2015    Priority: Low   Hyperlipidemia 07/29/2006    Priority: Low   Chest pressure 05/07/2021   Abdominal pain 05/07/2021   Left rotator cuff tear 02/05/2021   Hypomagnesemia 01/13/2020   Port-A-Cath in place 07/07/2019   Hypokalemia 12/16/2018   Encounter for antineoplastic chemotherapy 12/02/2018   Encounter for antineoplastic immunotherapy 12/02/2018    Medications: Reconciled today in Epic __________________________________________________________  Physical Exam:  Vital Signs: Vitals:   05/07/21 0856  BP: 134/81  Pulse: 92  Temp: 98.9 F (37.2 C)  TempSrc: Oral  SpO2: 97%  Weight: 138 lb 11.2 oz (62.9 kg)  Height: 5' 1"  (1.549 m)    Gen: Mildly ill and tired appearing woman Neck: No cervical LAD, No thyromegaly or nodules CV: RRR, no murmurs Pulm: tachypnea, expiratory low pitched wheezing right worse than left Abd: Soft, midline surgical scar, palpable mass in the epigastrium is tender, no distention or peritoneal signs Ext: Warm, 1+ bilateral pitting edema

## 2021-05-07 NOTE — Addendum Note (Signed)
Addended by: Lalla Brothers T on: 05/07/2021 01:45 PM   Modules accepted: Orders

## 2021-05-07 NOTE — Assessment & Plan Note (Signed)
Very dyspneic with minimal exertion at this point, has become function limiting. Still not using bronchodilator on a regular basis. I reviewed the use of the spiriva inhaler which she has, she used it in the office. Also has albuterol inhaler. I don't think this is a COPD exacerbation at this point, so no role for steroid or antibiotics right now, but that could change.

## 2021-05-09 ENCOUNTER — Telehealth: Payer: Self-pay | Admitting: Student in an Organized Health Care Education/Training Program

## 2021-05-09 ENCOUNTER — Other Ambulatory Visit: Payer: Self-pay

## 2021-05-09 ENCOUNTER — Ambulatory Visit (HOSPITAL_COMMUNITY)
Admission: RE | Admit: 2021-05-09 | Discharge: 2021-05-09 | Disposition: A | Discharge status: Home / Self Care | Payer: Medicare HMO | Source: Ambulatory Visit | Attending: Student in an Organized Health Care Education/Training Program | Admitting: Student in an Organized Health Care Education/Training Program

## 2021-05-09 DIAGNOSIS — I700 Atherosclerosis of aorta: Secondary | ICD-10-CM | POA: Diagnosis not present

## 2021-05-09 DIAGNOSIS — R109 Unspecified abdominal pain: Secondary | ICD-10-CM | POA: Diagnosis not present

## 2021-05-09 DIAGNOSIS — R1013 Epigastric pain: Secondary | ICD-10-CM | POA: Diagnosis not present

## 2021-05-09 MED ORDER — IOHEXOL 300 MG/ML  SOLN
75.0000 mL | Freq: Once | INTRAMUSCULAR | Status: AC | PRN
  Administered 2021-05-09: 75 mL via INTRAVENOUS

## 2021-05-09 MED ORDER — SODIUM CHLORIDE (PF) 0.9 % IJ SOLN
INTRAMUSCULAR | Status: DC
Start: 2021-05-09 — End: 2021-05-10
  Filled 2021-05-09: qty 50

## 2021-05-09 MED ORDER — IOHEXOL 9 MG/ML PO SOLN
500.0000 mL | ORAL | Status: AC
  Administered 2021-05-09: 1000 mL via ORAL

## 2021-05-09 NOTE — Telephone Encounter (Signed)
I spoke with patient about results of CT scan today, which shows pancolitis most likely related to docitaxel infusion last Wednesday. Symptom burden is moderate right now, she is still able to hydrate and eat food, no vomiting, so sounds low risk for dehydration right now. Pain is borderline tolerable, she has some Norco left over from prior treatments which she is going to try tonight. CT also shows left lobe collapse of the lung with a pleural effusion, which has been seen on prior imaging as well due to her malignancy.  [place], can we make her an appointment for this Friday morning with a resident to follow up on her symptoms and check her volume status please? She would prefer earliest possible appointment. Thank you.

## 2021-05-11 ENCOUNTER — Telehealth: Payer: Self-pay | Admitting: *Deleted

## 2021-05-11 ENCOUNTER — Encounter: Payer: Self-pay | Admitting: Internal Medicine

## 2021-05-11 ENCOUNTER — Ambulatory Visit (INDEPENDENT_AMBULATORY_CARE_PROVIDER_SITE_OTHER): Payer: Medicare HMO | Admitting: Internal Medicine

## 2021-05-11 VITALS — BP 105/76 | HR 90 | Temp 98.2°F | Resp 24 | Ht 61.0 in | Wt 135.9 lb

## 2021-05-11 DIAGNOSIS — R109 Unspecified abdominal pain: Secondary | ICD-10-CM | POA: Diagnosis not present

## 2021-05-11 DIAGNOSIS — C3491 Malignant neoplasm of unspecified part of right bronchus or lung: Secondary | ICD-10-CM | POA: Diagnosis not present

## 2021-05-11 DIAGNOSIS — K5100 Ulcerative (chronic) pancolitis without complications: Secondary | ICD-10-CM | POA: Insufficient documentation

## 2021-05-11 DIAGNOSIS — J439 Emphysema, unspecified: Secondary | ICD-10-CM | POA: Diagnosis not present

## 2021-05-11 NOTE — Patient Instructions (Signed)
Thank you, [name] for allowing [place] to provide your care today. Today we discussed:  Abdominal pain: Continue to eat and drink as much as you are able to. I am glad your pain is getting better and please call our office if you need more pain medication (Norco).   Cancer: Your symptoms were likely caused by your chemotherapy infusion. Our team has spoken with Debra Rodgers and he will be seeing you before your next scheduled treatment. We want to make sure you are as comfortable as possible and we want to focus on maximizing your quality of life.   I have ordered the following labs for you:  Lab Orders  No laboratory test(s) ordered today     Referrals ordered today:   Referral Orders  No referral(s) requested today     I have ordered the following medication/changed the following medications:   Stop the following medications: There are no discontinued medications.   Start the following medications: No orders of the defined types were placed in this encounter.    Follow up:  1 month with PCP     Remember: To call if you need any pain medicine  Should you have any questions or concerns please call the internal medicine clinic at [phone].     Buddy Duty, D.O. [place]

## 2021-05-11 NOTE — Assessment & Plan Note (Addendum)
Patient presented to the clinic 4 days ago with progressive abdominal pain, food aversion, nausea, and diarrhea. CT abdomen pelvis was ordered at that time and showed pancolitis worse in the ascending colon, as well as left lobe collapse of the lung with a pleural effusion, which had been seen on prior imaging and consistent with malignancy. Pancolitis is thought to be secondary to docitaxel infusion. Since then, the patient states that her pain has improved but is still present. She continues to have about 5 loose bowel movements per day, but notes that they are a lower volume now. She continues to deny any melena or hematochezia. Her appetite remains poor, but she is still able to eat and drink some. Patient has been taking norco prn (took one dose this week) which significantly helped her pain, and helped her to sleep through the night. Denies any nausea or vomiting.   Discussed with the patient that her symptoms are likely secondary to her chemotherapy infusions, and that we suspect she will continue to have adverse effects with repeated treatments (patient is already on dose reduced docitaxel). Strongly recommended that the patient consider palliative/hospice services. The patient also notes that she is having a difficult time with some ADLs, including bathing and cooking for herself. She is interested in personal care services (PCS), thus I have referred her to our social worker to assist with this. Additionally, the patient is advised to call back when she needs a refill of her norco. We discussed focusing on keeping her comfortable, as well as maximizing her quality of life. Patient will consider hospice services.

## 2021-05-11 NOTE — Progress Notes (Signed)
° °  CC: 1 week follow up abd pain   HPI:  [name] is a 76 y.o. person with stage 4 SCC of the lung, HTN, and AAA who presents to the [facility] to follow up her abdominal pain. At her last visit 1 week ago, the patient was noted to have worsening abdominal pain, 5 BM per day (no hematochezia, no melena), and poor appetite. CT scan at that time showed pancolitis likely secondary to docitaxel infusion. Also noted to have left lobe collapse of the lung with a pleural effusion, which had been seen on prior imaging and is secondary to her underlying malignancy. The patient states that since then, her pain has improved but is still present. She is still having about 5 BM/day, but notes that they are a lower volume now, although still loose. She continues to deny melena or hematochezia. Her appetite still remains poor, but she is able to eat and drink some throughout the day. She denies any nausea or vomiting. Patient also notes that she does still experience dyspnea on exertion, but she is still able to ambulate in her home with her cane.   Past Medical History:  Diagnosis Date   AAA (abdominal aortic aneurysm)    COPD (chronic obstructive pulmonary disease) (HCC)    Depression    DVT (deep venous thrombosis) ([place]) 07/12/2019   Essential hypertension    Headache    History of migraine headaches    lung ca dx'd 10/2018   Sleep apnea    Tobacco use disorder    Review of Systems:  Review of Systems  Constitutional:  Negative for chills and fever.  HENT:  Negative for sore throat.   Respiratory:  Positive for cough and wheezing. Negative for shortness of breath.   Cardiovascular:  Positive for leg swelling. Negative for chest pain and palpitations.  Gastrointestinal:  Positive for abdominal pain and diarrhea. Negative for blood in stool, melena, nausea and vomiting.  Musculoskeletal:  Negative for falls.  Neurological:  Positive for weakness. Negative for dizziness and headaches.    Physical  Exam:  Vitals:   05/11/21 0938  BP: 105/76  Pulse: 90  SpO2: 96%  Weight: 135 lb 14.4 oz (61.6 kg)  Height: 5' 1"  (1.549 m)   General: Chronically ill-appearing female in wheelchair. NAD. CV: RRR. No murmurs. 1+ bilateral LE edema Pulmonary: Normal work of breathing. Expiratory wheezing R>L and worse in lung bases Abdominal: Tenderness to palpation in left lower quadrant. No distension. +BS Skin: Warm and dry.  Neuro: A&Ox3. No focal deficit. Psych: Normal mood and affect    Assessment & Plan:   See Encounters Tab for problem based charting.  Patient seen with Dr. Evette Doffing

## 2021-05-11 NOTE — Assessment & Plan Note (Signed)
Patient denies any shortness of breath at rest, but does still experience significant dyspnea on exertion. She is able to ambulate throughout her home with the assistance of her cane, but this is very limited by her breathing. The patient brought in her Spiriva inhaler, which she states that she uses daily, although she has not yet used it today.  I still do not think this is a COPD exacerbation, so will hold off on steroids or antibiotics at this time. Suspect that her symptoms are secondary to physical deconditioning.

## 2021-05-11 NOTE — Chronic Care Management (AMB) (Signed)
°  Care Management   Outreach Note  05/11/2021 Name: Debra Rodgers MRN: [id number] DOB: [birth year]  Referred by: [name], MD Reason for referral : Care Coordination (Initial outreach to schedule referral with BSW)   An unsuccessful telephone outreach was attempted today. The patient was referred to the case management team for assistance with care management and care coordination.   Follow Up Plan:  The care management team will reach out to the patient again over the next 7 days. If patient returns call to provider office, please advise to call [facility] at [phone].  [facility]  Direct Dial: [phone]

## 2021-05-14 NOTE — Chronic Care Management (AMB) (Signed)
°  Care Management   Note  05/14/2021 Name: Debra Rodgers MRN: [id number] DOB: Dec 22, 1945  Debra Rodgers is a 76 y.o. year old female who is a primary care patient of [name], MD. I reached out to [name] by phone today in response to a referral sent by Ms. Debra Rodgers's primary care provider.   Debra Rodgers was given information about care management services today including:  Care management services include personalized support from designated clinical staff supervised by her physician, including individualized plan of care and coordination with other care providers 24/7 contact phone numbers for assistance for urgent and routine care needs. The patient may stop care management services at any time by phone call to the office staff.  Patient agreed to services and verbal consent obtained.   Follow up plan: Telephone appointment with care management team member scheduled for:05/21/21  [facility]  Direct Dial: [phone]

## 2021-05-14 NOTE — Progress Notes (Unsigned)
ASSESSMENT & PLAN:  Debra Rodgers is a 76 y.o. female with a stable extent IV thoracoabdominal aneurysm in setting of stage IV non-small cell lung cancer for which she is undergoing palliative chemotherapy.   A statement from the [place] [employer] and Society for Vascular Surgery estimated the annual rupture risk according to AAA diameter to be the following: 4.0 cm to 4.9 cm in diameter - 0.5% to 5%  The patient is not a candidate for elective repair of the aneurysm to prevent rupture because it is not yet at size threshold (>5cm in Female). She has a low life expectancy (7-12 mo with Stage IV NSCLC), but has so far had an excellent response to chemotherapy.  Follow up with me in 6 months.  CHIEF COMPLAINT:   aneurysm  HISTORY:  HISTORY OF PRESENT ILLNESS: Debra Rodgers is a 76 y.o. female referred for evaluation of abdominal aortic aneurysm demonstrated on CT scan in the [facility] 04/26/2020.  He has a history of stage IV non-small cell lung cancer for which she is undergoing palliative chemotherapy as directed by Dr. Earlie Server.  She also has a history of DVT.  From an aneurysm perspective, she is asymptomatic.  11/07/20: patient has been undergoing palliative chemotherapy. She seems to be tolerating this well. Continues to have surveillance CT for cancer surveillance.   Past Medical History:  Diagnosis Date   AAA (abdominal aortic aneurysm)    COPD (chronic obstructive pulmonary disease) (HCC)    Depression    DVT (deep venous thrombosis) ([place]) 07/12/2019   Essential hypertension    Headache    History of migraine headaches    lung ca dx'd 10/2018   Sleep apnea    Tobacco use disorder     Past Surgical History:  Procedure Laterality Date   ABDOMINAL HYSTERECTOMY     CHOLECYSTECTOMY     IR IMAGING GUIDED PORT INSERTION  02/24/2019   ROTATOR CUFF REPAIR  3/04   VIDEO BRONCHOSCOPY WITH ENDOBRONCHIAL ULTRASOUND Right 11/25/2018    Procedure: VIDEO BRONCHOSCOPY WITH ENDOBRONCHIAL ULTRASOUND;  Surgeon: Collene Gobble, MD;  Location: [facility] OR;  Service: Thoracic;  Laterality: Right;    Family History  Problem Relation Age of Onset   Hypertension Mother    Stroke Mother    Coronary artery disease Mother    Heart disease Father    Diabetes Sister    Hypertension Sister    Cancer Sister    Breast cancer Sister     Social History   Socioeconomic History   Marital status: Married    Spouse name: Not on file   Number of children: Not on file   Years of education: Not on file   Highest education level: Not on file  Occupational History   Not on file  Tobacco Use   Smoking status: Former    Packs/day: 1.00    Years: 56.00    Pack years: 56.00    Types: Cigarettes    Start date: 1964    Quit date: 11/04/2018    Years since quitting: 2.5   Smokeless tobacco: Never  Vaping Use   Vaping Use: Never used  Substance and Sexual Activity   Alcohol use: No    Alcohol/week: 0.0 standard drinks   Drug use: No   Sexual activity: Never    Partners: Male  Other Topics Concern   Not on file  Social History Narrative   Married, Regular Exercise- yes  Social Determinants of Health   Financial Resource Strain: Not on file  Food Insecurity: Not on file  Transportation Needs: Not on file  Physical Activity: Not on file  Stress: Not on file  Social Connections: Not on file  Intimate Partner Violence: Not on file    Allergies  Allergen Reactions   Codeine Sulfate     REACTION: "makes me high"   Pantoprazole Sodium Swelling and Rash    facial swelling    Current Outpatient Medications  Medication Sig Dispense Refill   acetaminophen (TYLENOL) 500 MG tablet Take 500 mg by mouth every 6 (six) hours as needed for moderate pain.     albuterol (VENTOLIN HFA) 108 (90 Base) MCG/ACT inhaler Inhale 2 puffs into the lungs every 6 (six) hours as needed for wheezing or shortness of breath. 4 each 0   amLODipine (NORVASC)  10 MG tablet TAKE 1 TABLET EVERY DAY 90 tablet 3   atorvastatin (LIPITOR) 20 MG tablet TAKE 1 TABLET EVERY DAY 90 tablet 3   chlorthalidone (HYGROTON) 50 MG tablet TAKE 1 TABLET EVERY DAY (Patient taking differently: Take 50 mg by mouth daily.) 90 tablet 3   dexamethasone (DECADRON) 4 MG tablet TAKE 2 TABLETS TWICE DAILY THE DAY BEFORE, THE DAY OF, AND THE DAY AFTER CHEMOTHERAPY 51 tablet 2   famotidine (PEPCID) 20 MG tablet Take 20 mg by mouth daily.      FLUoxetine (PROZAC) 20 MG capsule TAKE 1 CAPSULE EVERY DAY (Patient taking differently: Take 20 mg by mouth daily.) 90 capsule 3   fluticasone (FLONASE) 50 MCG/ACT nasal spray Place 1 spray into both nostrils daily. 11.1 mL 0   gabapentin (NEURONTIN) 100 MG capsule Take 1 capsule (100 mg total) by mouth 2 (two) times daily. 90 capsule 2   lidocaine-prilocaine (EMLA) cream Apply 1 application topically as needed. 30 g 1   magnesium oxide (MAG-OX) 400 (240 Mg) MG tablet Take 1 tablet (400 mg total) by mouth daily. 30 tablet 2   polyvinyl alcohol (LIQUIFILM TEARS) 1.4 % ophthalmic solution Place 1 drop into both eyes as needed for dry eyes.     potassium chloride SA (KLOR-CON M) 20 MEQ tablet Take 1 tablet (20 mEq total) by mouth 2 (two) times daily. 14 tablet 0   tiotropium (SPIRIVA HANDIHALER) 18 MCG inhalation capsule Place 1 capsule (18 mcg total) into inhaler and inhale daily. 30 capsule 2   No current facility-administered medications for this visit.    REVIEW OF SYSTEMS:  [X]  denotes positive finding, [ ]  denotes negative finding Cardiac  Comments:  Chest pain or chest pressure:    Shortness of breath upon exertion:    Short of breath when lying flat:    Irregular heart rhythm:        Vascular    Pain in calf, thigh, or hip brought on by ambulation:    Pain in feet at night that wakes you up from your sleep:     Blood clot in your veins:    Leg swelling:         Pulmonary    Oxygen at home:    Productive cough:     Wheezing:          Neurologic    Sudden weakness in arms or legs:     Sudden numbness in arms or legs:     Sudden onset of difficulty speaking or slurred speech:    Temporary loss of vision in one eye:     Problems  with dizziness:         Gastrointestinal    Blood in stool:     Vomited blood:         Genitourinary    Burning when urinating:     Blood in urine:        Psychiatric    Major depression:         Hematologic    Bleeding problems:    Problems with blood clotting too easily:        Skin    Rashes or ulcers:        Constitutional    Fever or chills:     PHYSICAL EXAM:   There were no vitals filed for this visit.   Constitutional: chronically ill appearing in no distress. Appears adequately nourished.  Neurologic: CN intact. No focal findings. No sensory loss. Psychiatric: Mood and affect symmetric and appropriate. Eyes: No icterus. No conjunctival pallor. Ears, nose, throat: mucous membranes moist. Midline trachea.  Cardiac: regular rate and rhythm.  Respiratory: unlabored. Abdominal: soft, non-tender, non-distended.  Extremity: No edema. No cyanosis. No pallor.  Skin: No gangrene. No ulceration.  Lymphatic: No Stemmer's sign. No palpable lymphadenopathy.   DATA REVIEW:    Most recent CBC CBC Latest Ref Rng & Units 05/07/2021 05/02/2021 04/25/2021  WBC 4.0 - 10.5 K/uL 7.3 20.6(H) 19.6(H)  Hemoglobin 12.0 - 15.0 g/dL 11.2(L) 11.1(L) 10.1(L)  Hematocrit 36.0 - 46.0 % 34.9(L) 34.1(L) 31.7(L)  Platelets 150 - 400 K/uL 89(L) 204 143(L)     Most recent CMP CMP Latest Ref Rng & Units 05/07/2021 05/02/2021 04/25/2021  Glucose 70 - 99 mg/dL 94 139(H) 79  BUN 8 - 23 mg/dL 20 32(H) 20  Creatinine 0.44 - 1.00 mg/dL 0.66 0.83 0.75  Sodium 135 - 145 mmol/L 138 139 139  Potassium 3.5 - 5.1 mmol/L 2.9(L) 3.3(L) 3.4(L)  Chloride 98 - 111 mmol/L 99 99 102  CO2 22 - 32 mmol/L 30 30 33(H)  Calcium 8.9 - 10.3 mg/dL 8.9 9.6 9.0  Total Protein 6.5 - 8.1 g/dL 5.6(L) 6.7 6.0(L)  Total  Bilirubin 0.3 - 1.2 mg/dL 1.3(H) 0.6 0.6  Alkaline Phos 38 - 126 U/L 66 65 93  AST 15 - 41 U/L 19 12(L) 12(L)  ALT 0 - 44 U/L 10 6 5     Renal function Estimated Creatinine Clearance: 51.9 mL/min (by C-G formula based on SCr of 0.66 mg/dL).  Hemoglobin A1C (%)  Date Value  02/05/2021 5.4   Hgb A1c MFr Bld (%)  Date Value  10/13/2017 5.9 (H)    LDL Chol Calc (NIH)  Date Value Ref Range Status  02/05/2021 104 (H) 0 - 99 mg/dL Final    CT A/P 10/11/20 Personally reviewed.  Unchanged appearance of type IV thoracoabdominal aortic aneurysm.  Radiologist sees no evidence of metastatic disease.  Yevonne Aline. Stanford Breed, MD [facility] Phone Number: [phone] 05/14/2021 4:25 PM

## 2021-05-14 NOTE — Progress Notes (Signed)
Internal Medicine Clinic Attending  I saw and evaluated the patient.  I personally confirmed the key portions of the history and exam documented by Dr. Raymondo Band and I reviewed pertinent patient test results.  The assessment, diagnosis, and plan were formulated together and I agree with the documentation in the residents note.

## 2021-05-15 ENCOUNTER — Other Ambulatory Visit (HOSPITAL_COMMUNITY): Payer: Medicare HMO

## 2021-05-15 ENCOUNTER — Ambulatory Visit: Payer: Medicare HMO | Admitting: Vascular Surgery

## 2021-05-21 ENCOUNTER — Telehealth: Payer: Self-pay | Admitting: Licensed Clinical Social Worker

## 2021-05-21 ENCOUNTER — Telehealth: Payer: Medicare HMO | Admitting: Licensed Clinical Social Worker

## 2021-05-21 NOTE — Telephone Encounter (Signed)
°  Care Management   Follow Up Note   05/21/2021 Name: Debra Rodgers MRN: [id number] DOB: 1945/04/01   Referred by: [name], MD Reason for referral : No chief complaint on file.   An unsuccessful telephone outreach was attempted today. The patient was referred to the case management team for assistance with care management and care coordination.   Follow Up Plan: The care management team will reach out to the patient again over the next 7 days.   Milus Height, [place]  Social Worker [facility] Care Management  [phone]

## 2021-05-22 MED FILL — Dexamethasone Sodium Phosphate Inj 100 MG/10ML: INTRAMUSCULAR | Qty: 1 | Status: AC

## 2021-05-23 ENCOUNTER — Inpatient Hospital Stay: Payer: Medicare HMO | Admitting: Internal Medicine

## 2021-05-23 ENCOUNTER — Inpatient Hospital Stay: Payer: Medicare HMO

## 2021-05-23 ENCOUNTER — Encounter: Payer: Self-pay | Admitting: Internal Medicine

## 2021-05-23 ENCOUNTER — Other Ambulatory Visit: Payer: Self-pay

## 2021-05-23 ENCOUNTER — Inpatient Hospital Stay: Payer: Medicare HMO | Attending: Internal Medicine

## 2021-05-23 ENCOUNTER — Other Ambulatory Visit: Payer: Self-pay | Admitting: Medical Oncology

## 2021-05-23 VITALS — BP 124/87 | HR 103 | Temp 97.4°F | Resp 18 | Ht 61.0 in | Wt 145.7 lb

## 2021-05-23 DIAGNOSIS — G4730 Sleep apnea, unspecified: Secondary | ICD-10-CM | POA: Insufficient documentation

## 2021-05-23 DIAGNOSIS — C3491 Malignant neoplasm of unspecified part of right bronchus or lung: Secondary | ICD-10-CM | POA: Diagnosis not present

## 2021-05-23 DIAGNOSIS — Z79899 Other long term (current) drug therapy: Secondary | ICD-10-CM | POA: Insufficient documentation

## 2021-05-23 DIAGNOSIS — C782 Secondary malignant neoplasm of pleura: Secondary | ICD-10-CM | POA: Insufficient documentation

## 2021-05-23 DIAGNOSIS — J449 Chronic obstructive pulmonary disease, unspecified: Secondary | ICD-10-CM | POA: Insufficient documentation

## 2021-05-23 DIAGNOSIS — Z5111 Encounter for antineoplastic chemotherapy: Secondary | ICD-10-CM

## 2021-05-23 DIAGNOSIS — F17210 Nicotine dependence, cigarettes, uncomplicated: Secondary | ICD-10-CM | POA: Insufficient documentation

## 2021-05-23 DIAGNOSIS — C3411 Malignant neoplasm of upper lobe, right bronchus or lung: Secondary | ICD-10-CM | POA: Diagnosis not present

## 2021-05-23 DIAGNOSIS — I10 Essential (primary) hypertension: Secondary | ICD-10-CM | POA: Insufficient documentation

## 2021-05-23 DIAGNOSIS — Z86718 Personal history of other venous thrombosis and embolism: Secondary | ICD-10-CM | POA: Insufficient documentation

## 2021-05-23 DIAGNOSIS — Z95828 Presence of other vascular implants and grafts: Secondary | ICD-10-CM | POA: Diagnosis not present

## 2021-05-23 DIAGNOSIS — Z9221 Personal history of antineoplastic chemotherapy: Secondary | ICD-10-CM | POA: Diagnosis not present

## 2021-05-23 DIAGNOSIS — I714 Abdominal aortic aneurysm, without rupture, unspecified: Secondary | ICD-10-CM | POA: Insufficient documentation

## 2021-05-23 DIAGNOSIS — C3490 Malignant neoplasm of unspecified part of unspecified bronchus or lung: Secondary | ICD-10-CM

## 2021-05-23 LAB — CMP (CANCER CENTER ONLY)
ALT: 8 U/L (ref 0–44)
AST: 13 U/L — ABNORMAL LOW (ref 15–41)
Albumin: 3.7 g/dL (ref 3.5–5.0)
Alkaline Phosphatase: 60 U/L (ref 38–126)
Anion gap: 8 (ref 5–15)
BUN: 29 mg/dL — ABNORMAL HIGH (ref 8–23)
CO2: 30 mmol/L (ref 22–32)
Calcium: 9.5 mg/dL (ref 8.9–10.3)
Chloride: 102 mmol/L (ref 98–111)
Creatinine: 0.88 mg/dL (ref 0.44–1.00)
GFR, Estimated: >60 mL/min (ref >60)
Glucose, Bld: 149 mg/dL — ABNORMAL HIGH (ref 70–99)
Potassium: 3.6 mmol/L (ref 3.5–5.1)
Sodium: 140 mmol/L (ref 135–145)
Total Bilirubin: 0.5 mg/dL (ref 0.3–1.2)
Total Protein: 6.5 g/dL (ref 6.5–8.1)

## 2021-05-23 LAB — CBC WITH DIFFERENTIAL (CANCER CENTER ONLY)
Abs Immature Granulocytes: 0.11 10*3/uL — ABNORMAL HIGH (ref 0.00–0.07)
Basophils Absolute: 0 10*3/uL (ref 0.0–0.1)
Basophils Relative: 0 %
Eosinophils Absolute: 0 10*3/uL (ref 0.0–0.5)
Eosinophils Relative: 0 %
HCT: 32.8 % — ABNORMAL LOW (ref 36.0–46.0)
Hemoglobin: 10.7 g/dL — ABNORMAL LOW (ref 12.0–15.0)
Immature Granulocytes: 1 %
Lymphocytes Relative: 5 %
Lymphs Abs: 1 10*3/uL (ref 0.7–4.0)
MCH: 33.2 pg (ref 26.0–34.0)
MCHC: 32.6 g/dL (ref 30.0–36.0)
MCV: 101.9 fL — ABNORMAL HIGH (ref 80.0–100.0)
Monocytes Absolute: 0.5 10*3/uL (ref 0.1–1.0)
Monocytes Relative: 2 %
Neutro Abs: 18.6 10*3/uL — ABNORMAL HIGH (ref 1.7–7.7)
Neutrophils Relative %: 92 %
Platelet Count: 215 10*3/uL (ref 150–400)
RBC: 3.22 MIL/uL — ABNORMAL LOW (ref 3.87–5.11)
RDW: 19.1 % — ABNORMAL HIGH (ref 11.5–15.5)
WBC Count: 20.2 10*3/uL — ABNORMAL HIGH (ref 4.0–10.5)
nRBC: 0 % (ref 0.0–0.2)

## 2021-05-23 MED ORDER — SODIUM CHLORIDE 0.9% FLUSH
10.0000 mL | INTRAVENOUS | Status: DC | PRN
  Administered 2021-05-23: 10 mL

## 2021-05-23 MED ORDER — HEPARIN SOD (PORK) LOCK FLUSH 100 UNIT/ML IV SOLN
500.0000 [IU] | Freq: Once | INTRAVENOUS | Status: AC | PRN
  Administered 2021-05-23: 500 [IU]

## 2021-05-23 NOTE — Addendum Note (Signed)
Addended by: Ardeen Garland on: 05/23/2021 11:25 AM ? ? Modules accepted: Orders ? ?

## 2021-05-23 NOTE — Progress Notes (Signed)
[facility] [phone]   [phone]  OFFICE PROGRESS NOTE  [name], MD [place] [address]  DIAGNOSIS: Stage IV non-small cell lung cancer, squamous cell carcinoma. She presented with right upper lobe lung mass in addition to pleural-based metastasis and mediastinal lymphadenopathy. She was diagnosed in September 2020.    PRIOR THERAPY: Chemotherapy with carboplatin for an AUC of 5, paclitaxel 175 mg/m, and Keytruda 200 mg IV every 3 weeks with Neulasta support. Last dose 12/08/19. Status post 18 cycles.  Starting from cycle #5 was on maintenance treatment with single agent Keytruda every 3 weeks. This was discontinued due to evidence of disease progression.    CURRENT THERAPY: Palliative systemic chemotherapy with docetaxel 75 mg/m2 and Cyramza 10 mg/kg IV every 3 weeks with Neulasta support. First dose on 01/04/20.  Status post 24 cycles.  Starting from cycle #10 docetaxel was reduced to 65 Mg/M2  INTERVAL HISTORY: Debra Rodgers 76 y.o. female returns to the clinic today for follow-up visit.  The patient is feeling fine today with no concerning complaints except for fatigue and shortness of breath with exertion.  She was seen by her primary care physician last month complaining of acute abdominal pain and she underwent CT scan of the abdomen and pelvis at that time that showed pancolitis worse in the ascending colon suspicious to be infectious colitis or drug-induced.  She is feeling much better now.  She denied having any current chest pain but has shortness of breath at baseline increased with exertion with no cough or hemoptysis.  She has no nausea, vomiting, diarrhea or constipation.  She has no headache or visual changes.  She denied having any recent weight loss or night sweats.  She was supposed to start cycle #25 of her treatment today.  MEDICAL HISTORY: Past Medical History:  Diagnosis Date   AAA  (abdominal aortic aneurysm)    COPD (chronic obstructive pulmonary disease) (HCC)    Depression    DVT (deep venous thrombosis) ([place]) 07/12/2019   Essential hypertension    Headache    History of migraine headaches    lung ca dx'd 10/2018   Sleep apnea    Tobacco use disorder     ALLERGIES:  is allergic to codeine sulfate and pantoprazole sodium.  MEDICATIONS:  Current Outpatient Medications  Medication Sig Dispense Refill   acetaminophen (TYLENOL) 500 MG tablet Take 500 mg by mouth every 6 (six) hours as needed for moderate pain.     albuterol (VENTOLIN HFA) 108 (90 Base) MCG/ACT inhaler Inhale 2 puffs into the lungs every 6 (six) hours as needed for wheezing or shortness of breath. 4 each 0   amLODipine (NORVASC) 10 MG tablet TAKE 1 TABLET EVERY DAY 90 tablet 3   atorvastatin (LIPITOR) 20 MG tablet TAKE 1 TABLET EVERY DAY 90 tablet 3   chlorthalidone (HYGROTON) 50 MG tablet TAKE 1 TABLET EVERY DAY (Patient taking differently: Take 50 mg by mouth daily.) 90 tablet 3   dexamethasone (DECADRON) 4 MG tablet TAKE 2 TABLETS TWICE DAILY THE DAY BEFORE, THE DAY OF, AND THE DAY AFTER CHEMOTHERAPY 51 tablet 2   famotidine (PEPCID) 20 MG tablet Take 20 mg by mouth daily.      FLUoxetine (PROZAC) 20 MG capsule TAKE 1 CAPSULE EVERY DAY (Patient taking differently: Take 20 mg by mouth daily.) 90 capsule 3   fluticasone (FLONASE) 50 MCG/ACT nasal spray Place 1 spray into both nostrils  daily. 11.1 mL 0   gabapentin (NEURONTIN) 100 MG capsule Take 1 capsule (100 mg total) by mouth 2 (two) times daily. 90 capsule 2   lidocaine-prilocaine (EMLA) cream Apply 1 application topically as needed. 30 g 1   magnesium oxide (MAG-OX) 400 (240 Mg) MG tablet Take 1 tablet (400 mg total) by mouth daily. 30 tablet 2   polyvinyl alcohol (LIQUIFILM TEARS) 1.4 % ophthalmic solution Place 1 drop into both eyes as needed for dry eyes.     potassium chloride SA (KLOR-CON M) 20 MEQ tablet Take 1 tablet (20 mEq total) by  mouth 2 (two) times daily. 14 tablet 0   tiotropium (SPIRIVA HANDIHALER) 18 MCG inhalation capsule Place 1 capsule (18 mcg total) into inhaler and inhale daily. 30 capsule 2   No current facility-administered medications for this visit.    SURGICAL HISTORY:  Past Surgical History:  Procedure Laterality Date   ABDOMINAL HYSTERECTOMY     CHOLECYSTECTOMY     IR IMAGING GUIDED PORT INSERTION  02/24/2019   ROTATOR CUFF REPAIR  3/04   VIDEO BRONCHOSCOPY WITH ENDOBRONCHIAL ULTRASOUND Right 11/25/2018   Procedure: VIDEO BRONCHOSCOPY WITH ENDOBRONCHIAL ULTRASOUND;  Surgeon: Collene Gobble, MD;  Location: [facility] OR;  Service: Thoracic;  Laterality: Right;    REVIEW OF SYSTEMS:  Constitutional: positive for fatigue Eyes: negative Ears, nose, mouth, throat, and face: negative Respiratory: positive for dyspnea on exertion Cardiovascular: negative Gastrointestinal: negative Genitourinary:negative Integument/breast: negative Hematologic/lymphatic: negative Musculoskeletal:negative Neurological: negative Behavioral/Psych: negative Endocrine: negative Allergic/Immunologic: negative   PHYSICAL EXAMINATION: General appearance: alert, cooperative, fatigued, and no distress Head: Normocephalic, without obvious abnormality, atraumatic Neck: no adenopathy, no JVD, supple, symmetrical, trachea midline, and thyroid not enlarged, symmetric, no tenderness/mass/nodules Lymph nodes: Cervical, supraclavicular, and axillary nodes normal. Resp: clear to auscultation bilaterally Back: symmetric, no curvature. ROM normal. No CVA tenderness. Cardio: regular rate and rhythm, S1, S2 normal, no murmur, click, rub or gallop GI: soft, non-tender; bowel sounds normal; no masses,  no organomegaly Extremities: edema 1+ edema Neurologic: Alert and oriented X 3, normal strength and tone. Normal symmetric reflexes. Normal coordination and gait  ECOG PERFORMANCE STATUS: 1 - Symptomatic but completely ambulatory  Blood  pressure 124/87, pulse (!) 103, temperature (!) 97.4 F (36.3 C), temperature source Tympanic, resp. rate 18, height 5' 1"  (1.549 m), weight 145 lb 11.2 oz (66.1 kg), SpO2 96 %.  LABORATORY DATA: Lab Results  Component Value Date   WBC 20.2 (H) 05/23/2021   HGB 10.7 (L) 05/23/2021   HCT 32.8 (L) 05/23/2021   MCV 101.9 (H) 05/23/2021   PLT 215 05/23/2021      Chemistry      Component Value Date/Time   NA 138 05/07/2021 0936   NA 139 07/21/2017 0958   K 2.9 (L) 05/07/2021 0936   CL 99 05/07/2021 0936   CO2 30 05/07/2021 0936   BUN 20 05/07/2021 0936   BUN 26 07/21/2017 0958   CREATININE 0.66 05/07/2021 0936   CREATININE 0.83 05/02/2021 0923   CREATININE 0.79 09/03/2013 1540      Component Value Date/Time   CALCIUM 8.9 05/07/2021 0936   ALKPHOS 66 05/07/2021 0936   AST 19 05/07/2021 0936   AST 12 (L) 05/02/2021 0923   ALT 10 05/07/2021 0936   ALT 6 05/02/2021 0923   BILITOT 1.3 (H) 05/07/2021 0936   BILITOT 0.6 05/02/2021 0923       RADIOGRAPHIC STUDIES: CT Abdomen Pelvis W Contrast  Result Date: 05/09/2021 CLINICAL DATA:  A 76 year old  female presents with acute abdominal pain. EXAM: CT ABDOMEN AND PELVIS WITH CONTRAST TECHNIQUE: Multidetector CT imaging of the abdomen and pelvis was performed using the standard protocol following bolus administration of intravenous contrast. RADIATION DOSE REDUCTION: This exam was performed according to the departmental dose-optimization program which includes automated exposure control, adjustment of the mA and/or kV according to patient size and/or use of iterative reconstruction technique. CONTRAST:  60m OMNIPAQUE IOHEXOL 300 MG/ML  SOLN COMPARISON:  December 25, 2020 FINDINGS: Lower chest: Increased consolidation at the LEFT lung base associated with pleural effusion that appears loculated but without signs of pleural enhancement. LEFT lower lobe is nearly completely collapsed. Heart size is mildly enlarged and with small pericardial  effusion. Heart is incompletely imaged. Hepatobiliary: Smooth hepatic contours. Hepatic cysts. No focal, suspicious hepatic lesion. Biliary duct distension following cholecystectomy is similar to the prior exam. Pancreas: Normal, without mass, inflammation or ductal dilatation. Spleen: Normal spleen. Adrenals/Urinary Tract: Urinary bladder is under distended limiting assessment. No gross perivesical stranding. No signs of hydronephrosis or ureteral dilation. Mild cortical loss on the LEFT associated with LEFT kidney without signs of irregular scarring may relate to underlying vascular disease. No focal, suspicious renal abnormality. No visible ureteral calculi though there are numerous phleboliths about the pelvis. Stomach/Bowel: No signs of bowel obstruction. Diffuse colonic thickening worse in the ascending and transverse colon. Vascular/Lymphatic: Signs of abdominal aortic aneurysm with abundant soft plaque of the suprarenal segment extending into the upper aspect of the infrarenal abdominal aorta. Dilation begins at the level of the aortic hiatus and distal thoracic aorta. Caliber at the level of the distal thoracic aorta is stable at approximately 4.2 cm greatest transverse dimension. Abrupt caliber change in the infrarenal segment. Greatest dilation in the infrarenal segment at and just below the renal artery origins. Abundant soft plaque in this area with a maximal caliber of approximately 4.8 cm without gross change. Patent abdominal vessels on venous phase assessment. Extensive atherosclerotic changes extend into the iliac vessels. Smooth contour of the IVC. There is no gastrohepatic or hepatoduodenal ligament lymphadenopathy. No retroperitoneal or mesenteric lymphadenopathy. No pelvic sidewall lymphadenopathy. Reproductive: LEFT adnexal cyst 7.1 x 6.6 cm similar to imaging dating back to 2022. Other: No pneumoperitoneum.  No ascites. Musculoskeletal: Body wall edema with increased since December 25, 2020.  IMPRESSION: 1. Pancolitis worse in the ascending colon. Correlate with any risk factors for infectious colitis. Could also consider drug related colitis in the context of ongoing therapy if applicable in this patient with history of lung cancer. 2. Thoracoabdominal aortic aneurysm extending into the upper infrarenal segment with maximal caliber approaching or at 5 cm depending on measurements obtained. Close attention on follow-up in this patient with history of neoplasm is suggested. Vascular surgery evaluation could also be considered as appropriate on follow-up though findings do not appear changed since previous imaging from October of 2022. 3. Near complete collapse of the LEFT lower lobe with increasing pleural effusion which may be partially loculated correlate with any signs of respiratory symptoms, findings could be related to infection or enlarging effusion with compressive atelectasis. Attention on subsequent imaging in this patient with lung cancer is suggested. No current pleural nodularity. 4. LEFT adnexal cyst stable over a series of prior studies. Suggest continued surveillance or consider ultrasound on follow-up for further evaluation if not yet performed. Aortic Atherosclerosis (ICD10-I70.0). Electronically Signed   By: GZetta BillsM.D.   On: 05/09/2021 14:20    ASSESSMENT AND PLAN: This is a very pleasant  76 years old African-American female with stage IV non-small cell carcinoma,, squamous cell carcinoma diagnosed in September 2020.  She presented with extensive right-sided pleural and thoracic nodal hypermetabolic disease with no extrathoracic disease. The patient started induction treatment with systemic chemotherapy with carboplatin, paclitaxel and Keytruda status post 4 cycles with partial response after cycle #4.  This was followed by 14 cycles of maintenance treatment with single agent Keytruda discontinued secondary to disease progression. The patient is currently undergoing second  line systemic chemotherapy with docetaxel 75 mg/M2 and Cyramza 10 mg/KG every 3 weeks with Neulasta support.  Status post 24 cycles.  Starting from cycle #10 her dose of docetaxel was reduced to 65 Mg/M2. The patient has been tolerating this treatment well except for the fatigue and most recently she had acute abdominal pain and CT scan of the abdomen and pelvis showed evidence for pancolitis that could be drug-induced. I had a lengthy discussion with the patient today about her condition and treatment options. I recommended for the patient to hold any additional treatment for now.  I recommended for her to continue on observation since her disease has been stable over several studies. I will see her back for follow-up visit in 2 months for evaluation with repeat CT scan of the chest, abdomen and pelvis for restaging of her disease. The patient was advised to call immediately if she has any other concerning symptoms in the interval. The patient voices understanding of current disease status and treatment options and is in agreement with the current care plan. All questions were answered. The patient knows to call the clinic with any problems, questions or concerns. We can certainly see the patient much sooner if necessary.   Disclaimer: This note was dictated with voice recognition software. Similar sounding words can inadvertently be transcribed and may not be corrected upon review.

## 2021-05-25 ENCOUNTER — Inpatient Hospital Stay: Payer: Medicare HMO

## 2021-05-28 NOTE — Progress Notes (Signed)
ASSESSMENT & PLAN:  Debra Rodgers is a 76 y.o. 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   A statement from the [employer] and Society for Vascular Surgery estimated the annual rupture risk according to AAA diameter to be the following: 4.0 cm to 4.9 cm in diameter - 0.5% to 5%  The patient is not a candidate for elective repair of the aneurysm to prevent rupture because it is not yet at size threshold (>5cm in Female). She has a low life expectancy (7-12 mo with Stage IV NSCLC). She has had to stop palliative chemotherapy. She is deconditioned. I do not think she would do well with an elective aneurysm repair.  Follow up with me in 6 months.  CHIEF COMPLAINT:   aneurysm  HISTORY:  HISTORY OF PRESENT ILLNESS: Debra Rodgers is a 76 y.o. female referred for evaluation of abdominal aortic aneurysm demonstrated on CT scan in the [facility] 04/26/2020.  He has a history of stage IV non-small cell lung cancer for which she is undergoing palliative chemotherapy as directed by Dr. Earlie Server.  She also has a history of DVT.  From an aneurysm perspective, she is asymptomatic.  11/07/20: patient has been undergoing palliative chemotherapy. She seems to be tolerating this well. Continues to have surveillance CT for cancer surveillance.   05/29/21: Patient returns to clinic.  Palliative chemotherapy has been stopped because of fatigue and stable disease.  The patient describes severe deconditioning.  She is able to walk around the house and attend to her ADLs.  She has difficulty getting out of the house.  She is walking with a walker.  She is in a wheelchair in clinic today.  Globally, she appears much worse than the last time I saw her.  Past Medical History:  Diagnosis Date   AAA (abdominal aortic aneurysm)     COPD (chronic obstructive pulmonary disease) (HCC)    Depression    DVT (deep venous thrombosis) ([place]) 07/12/2019   Essential hypertension    Headache    History of migraine headaches    lung ca dx'd 10/2018   Sleep apnea    Tobacco use disorder     Past Surgical History:  Procedure Laterality Date   ABDOMINAL HYSTERECTOMY     CHOLECYSTECTOMY     IR IMAGING GUIDED PORT INSERTION  02/24/2019   ROTATOR CUFF REPAIR  3/04   VIDEO BRONCHOSCOPY WITH ENDOBRONCHIAL ULTRASOUND Right 11/25/2018   Procedure: VIDEO BRONCHOSCOPY WITH ENDOBRONCHIAL ULTRASOUND;  Surgeon: Collene Gobble, MD;  Location: [facility] OR;  Service: Thoracic;  Laterality: Right;    Family History  Problem Relation Age of Onset   Hypertension Mother    Stroke Mother    Coronary artery disease Mother    Heart disease Father    Diabetes Sister    Hypertension Sister    Cancer Sister    Breast cancer Sister     Social History   Socioeconomic History   Marital status: Married    Spouse name: Not on file   Number of children: Not on file   Years of education: Not on file   Highest education level: Not on file  Occupational History   Not on file  Tobacco Use   Smoking status: Former    Packs/day: 1.00    Years: 56.00    Pack  years: 56.00    Types: Cigarettes    Start date: 58    Quit date: 11/04/2018    Years since quitting: 2.5   Smokeless tobacco: Never  Vaping Use   Vaping Use: Never used  Substance and Sexual Activity   Alcohol use: No    Alcohol/week: 0.0 standard drinks   Drug use: No   Sexual activity: Never    Partners: Male  Other Topics Concern   Not on file  Social History Narrative   Married, Regular Exercise- yes   Social Determinants of Health   Financial Resource Strain: Not on file  Food Insecurity: Not on file  Transportation Needs: Not on file  Physical Activity: Not on file  Stress: Not on file  Social Connections: Not on file  Intimate Partner Violence: Not on file     Allergies  Allergen Reactions   Codeine Sulfate     REACTION: "makes me high"   Pantoprazole Sodium Swelling and Rash    facial swelling    Current Outpatient Medications  Medication Sig Dispense Refill   acetaminophen (TYLENOL) 500 MG tablet Take 500 mg by mouth every 6 (six) hours as needed for moderate pain.     albuterol (VENTOLIN HFA) 108 (90 Base) MCG/ACT inhaler Inhale 2 puffs into the lungs every 6 (six) hours as needed for wheezing or shortness of breath. 4 each 0   amLODipine (NORVASC) 10 MG tablet TAKE 1 TABLET EVERY DAY 90 tablet 3   atorvastatin (LIPITOR) 20 MG tablet TAKE 1 TABLET EVERY DAY 90 tablet 3   chlorthalidone (HYGROTON) 50 MG tablet TAKE 1 TABLET EVERY DAY (Patient taking differently: Take 50 mg by mouth daily.) 90 tablet 3   dexamethasone (DECADRON) 4 MG tablet TAKE 2 TABLETS TWICE DAILY THE DAY BEFORE, THE DAY OF, AND THE DAY AFTER CHEMOTHERAPY 51 tablet 2   famotidine (PEPCID) 20 MG tablet Take 20 mg by mouth daily.      FLUoxetine (PROZAC) 20 MG capsule TAKE 1 CAPSULE EVERY DAY (Patient taking differently: Take 20 mg by mouth daily.) 90 capsule 3   gabapentin (NEURONTIN) 100 MG capsule Take 1 capsule (100 mg total) by mouth 2 (two) times daily. 90 capsule 2   lidocaine-prilocaine (EMLA) cream Apply 1 application topically as needed. 30 g 1   magnesium oxide (MAG-OX) 400 (240 Mg) MG tablet Take 1 tablet (400 mg total) by mouth daily. 30 tablet 2   polyvinyl alcohol (LIQUIFILM TEARS) 1.4 % ophthalmic solution Place 1 drop into both eyes as needed for dry eyes.     potassium chloride SA (KLOR-CON M) 20 MEQ tablet Take 1 tablet (20 mEq total) by mouth 2 (two) times daily. 14 tablet 0   tiotropium (SPIRIVA HANDIHALER) 18 MCG inhalation capsule Place 1 capsule (18 mcg total) into inhaler and inhale daily. 30 capsule 2   fluticasone (FLONASE) 50 MCG/ACT nasal spray Place 1 spray into both nostrils daily. 11.1 mL 0   No current facility-administered medications for  this visit.    REVIEW OF SYSTEMS:  [X]  denotes positive finding, [ ]  denotes negative finding Cardiac  Comments:  Chest pain or chest pressure:    Shortness of breath upon exertion:    Short of breath when lying flat:    Irregular heart rhythm:        Vascular    Pain in calf, thigh, or hip brought on by ambulation:    Pain in feet at night that wakes you up from your sleep:  Blood clot in your veins:    Leg swelling:         Pulmonary    Oxygen at home:    Productive cough:     Wheezing:         Neurologic    Sudden weakness in arms or legs:     Sudden numbness in arms or legs:     Sudden onset of difficulty speaking or slurred speech:    Temporary loss of vision in one eye:     Problems with dizziness:         Gastrointestinal    Blood in stool:     Vomited blood:         Genitourinary    Burning when urinating:     Blood in urine:        Psychiatric    Major depression:         Hematologic    Bleeding problems:    Problems with blood clotting too easily:        Skin    Rashes or ulcers:        Constitutional    Fever or chills:     PHYSICAL EXAM:   Vitals:   05/29/21 0844  BP: 101/69  Pulse: 73  Resp: 20  Temp: 98.4 F (36.9 C)  SpO2: 97%  Weight: 145 lb (65.8 kg)  Height: 5' 1"  (1.549 m)     Constitutional: chronically ill appearing in no distress. Appears adequately nourished.  Neurologic: CN intact. No focal findings. No sensory loss. Psychiatric: Mood and affect symmetric and appropriate. Eyes: No icterus. No conjunctival pallor. Ears, nose, throat: mucous membranes moist. Midline trachea.  Cardiac: regular rate and rhythm.  Respiratory: unlabored. Abdominal: soft, non-tender, non-distended.  Extremity: No edema. No cyanosis. No pallor.  Skin: No gangrene. No ulceration.  Lymphatic: No Stemmer's sign. No palpable lymphadenopathy.   DATA REVIEW:    Most recent CBC CBC Latest Ref Rng & Units 05/23/2021 05/07/2021 05/02/2021  WBC 4.0  - 10.5 K/uL 20.2(H) 7.3 20.6(H)  Hemoglobin 12.0 - 15.0 g/dL 10.7(L) 11.2(L) 11.1(L)  Hematocrit 36.0 - 46.0 % 32.8(L) 34.9(L) 34.1(L)  Platelets 150 - 400 K/uL 215 89(L) 204     Most recent CMP CMP Latest Ref Rng & Units 05/23/2021 05/07/2021 05/02/2021  Glucose 70 - 99 mg/dL 149(H) 94 139(H)  BUN 8 - 23 mg/dL 29(H) 20 32(H)  Creatinine 0.44 - 1.00 mg/dL 0.88 0.66 0.83  Sodium 135 - 145 mmol/L 140 138 139  Potassium 3.5 - 5.1 mmol/L 3.6 2.9(L) 3.3(L)  Chloride 98 - 111 mmol/L 102 99 99  CO2 22 - 32 mmol/L 30 30 30   Calcium 8.9 - 10.3 mg/dL 9.5 8.9 9.6  Total Protein 6.5 - 8.1 g/dL 6.5 5.6(L) 6.7  Total Bilirubin 0.3 - 1.2 mg/dL 0.5 1.3(H) 0.6  Alkaline Phos 38 - 126 U/L 60 66 65  AST 15 - 41 U/L 13(L) 19 12(L)  ALT 0 - 44 U/L 8 10 6     Renal function Estimated Creatinine Clearance: 48.7 mL/min (by C-G formula based on SCr of 0.88 mg/dL).  Hemoglobin A1C (%)  Date Value  02/05/2021 5.4   Hgb A1c MFr Bld (%)  Date Value  10/13/2017 5.9 (H)    LDL Chol Calc (NIH)  Date Value Ref Range Status  02/05/2021 104 (H) 0 - 99 mg/dL Final    CT A/P 05/09/21 Personally reviewed.  Extent IV thoracoabdominal aortic aneurysm has slightly enlarged.  It is nearly  5 cm.  Endovascular repair would require 3 or 4 vessel fenestrated EVAR and TEVAR.  Yevonne Aline. Stanford Breed, MD [facility] Phone Number: [phone] 05/29/2021 8:58 AM

## 2021-05-29 ENCOUNTER — Ambulatory Visit: Payer: Medicare HMO | Admitting: Vascular Surgery

## 2021-05-29 ENCOUNTER — Encounter: Payer: Self-pay | Admitting: Vascular Surgery

## 2021-05-29 ENCOUNTER — Ambulatory Visit (HOSPITAL_COMMUNITY)
Admission: RE | Admit: 2021-05-29 | Discharge: 2021-05-29 | Disposition: A | Discharge status: Home / Self Care | Payer: Medicare HMO | Source: Ambulatory Visit | Attending: Vascular Surgery | Admitting: Vascular Surgery

## 2021-05-29 ENCOUNTER — Other Ambulatory Visit: Payer: Self-pay

## 2021-05-29 VITALS — BP 101/69 | HR 73 | Temp 98.4°F | Resp 20 | Ht 61.0 in | Wt 145.0 lb

## 2021-05-29 DIAGNOSIS — I716 Thoracoabdominal aortic aneurysm, without rupture, unspecified: Secondary | ICD-10-CM | POA: Diagnosis not present

## 2021-05-29 DIAGNOSIS — I7162 Paravisceral aneurysm of the thoracoabdominal aorta, without rupture: Secondary | ICD-10-CM | POA: Diagnosis not present

## 2021-06-01 ENCOUNTER — Ambulatory Visit: Payer: Medicare HMO | Admitting: Licensed Clinical Social Worker

## 2021-06-01 NOTE — Chronic Care Management (AMB) (Signed)
?  Care Management  ? ?Social Work Visit Note ? ?06/01/2021 ?Name: Debra Rodgers MRN: [id number] DOB: Mar 23, 1946 ? ?Debra Rodgers is a 76 y.o. year old female who sees Evette Doffing, Mallie Mussel, MD for primary care. The care management team was consulted for assistance with care management and care coordination needs related to Level of Care Concerns  ? ?Patient was given the following information about care management and care coordination services today, agreed to services, and gave verbal consent: 1.care management/care coordination services include personalized support from designated clinical staff supervised by their physician, including individualized plan of care and coordination with other care providers 2. 24/7 contact phone numbers for assistance for urgent and routine care needs. 3. The patient may stop care management/care coordination services at any time by phone call to the office staff. ? ?Engaged with patient by telephone for initial visit in response to provider referral for social work chronic care management and care coordination services. ? ?Assessment: Review of patient history, allergies, and health status during evaluation of patient need for care management/care coordination services.   ? ?Interventions:  ?Patient interviewed and appropriate assessments performed ?Collaborated with clinical team regarding patient needs  ?SW spoke with patient on today. Patient is requesting her PCP to place an order for [facility] services. Patient has [employer]. SW will route message to provider.  ?Patient discussed no other concerns or questions.  ? ?SDOH (Social Determinants of Health) assessments performed: Yes ?   ? ?Plan:  ?SW will follow up with patient within 30 days.  ? ?Milus Height, BSW  ?Social Worker ?[facility]  ?[phone] ?  ? ? ? ? ? ? ? ? ? ? ? ? ? ? ?

## 2021-06-01 NOTE — Patient Instructions (Signed)
Visit Information ? ?Instructions: patient will work with SW to address concerns related to level of care. ? ?Patient was given the following information about care management and care coordination services today, agreed to services, and gave verbal consent: 1.care management/care coordination services include personalized support from designated clinical staff supervised by their physician, including individualized plan of care and coordination with other care providers 2. 24/7 contact phone numbers for assistance for urgent and routine care needs. 3. The patient may stop care management/care coordination services at any time by phone call to the office staff. ? ?Patient verbalizes understanding of instructions and care plan provided today and agrees to view in [place]. Active MyChart status confirmed with patient.   ? ?The care management team will reach out to the patient again over the next 30 days.  ? ?Milus Height, BSW  ?Social Worker ?[facility]  ?[phone] ?  ? ?  ?

## 2021-06-04 ENCOUNTER — Telehealth: Payer: Self-pay | Admitting: *Deleted

## 2021-06-04 NOTE — Telephone Encounter (Signed)
Called patient lvm regarding request for PCS/ asked patient to call back at [phone]. ?

## 2021-06-04 NOTE — Telephone Encounter (Signed)
Called  patient regarding her request for PCS / left voice message for patient to let her know that she needs to have Medicaid or some form of Medicaid.  Called patient x 2 / LVM. ?

## 2021-06-11 ENCOUNTER — Ambulatory Visit (INDEPENDENT_AMBULATORY_CARE_PROVIDER_SITE_OTHER): Payer: Medicare HMO | Admitting: Student in an Organized Health Care Education/Training Program

## 2021-06-11 ENCOUNTER — Encounter: Payer: Self-pay | Admitting: Student in an Organized Health Care Education/Training Program

## 2021-06-11 VITALS — BP 123/77 | HR 84 | Temp 98.3°F | Ht 61.0 in | Wt 139.8 lb

## 2021-06-11 DIAGNOSIS — Z23 Encounter for immunization: Secondary | ICD-10-CM | POA: Diagnosis not present

## 2021-06-11 DIAGNOSIS — I10 Essential (primary) hypertension: Secondary | ICD-10-CM

## 2021-06-11 DIAGNOSIS — C3491 Malignant neoplasm of unspecified part of right bronchus or lung: Secondary | ICD-10-CM

## 2021-06-11 DIAGNOSIS — G6289 Other specified polyneuropathies: Secondary | ICD-10-CM

## 2021-06-11 MED ORDER — GABAPENTIN 100 MG PO CAPS: 100.0000 mg | ORAL_CAPSULE | Freq: Two times a day (BID) | ORAL | 2 refills | Status: DC

## 2021-06-11 NOTE — Assessment & Plan Note (Signed)
Advanced disease that has been treated with palliative chemotherapy for the last 2 years, but now discontinued due to side effects. She is doing ok at home, certainly deconditioned with moderately low functional status. The colitis has clinically resolved at this time. Question going forward is how stable her disease will remain off treatment. She is going to see Dr. Inda Merlin in May and will have repeat CT at that time. Decision then will be whether to try a different palliative regimen, continue to monitor, or move to hospice. We talked today about maximizing quality of life at home, we talked about DME needs in the home. She is interested in personal care services, but I am not sure we will be able to get insurance to pay for that cost. Will talk with our CCM team to see what extra support we can offer.  ?

## 2021-06-11 NOTE — Assessment & Plan Note (Signed)
Blood pressure is well controlled today. Plan to continue with amlodipine 58m daily. No longer taking chlorthalidone, which is fine. ?

## 2021-06-11 NOTE — Progress Notes (Signed)
? ?  Assessment and Plan: ? ?See Encounters tab for problem-based medical decision making.  ? ?__________________________________________________________ ? ?HPI: ? ? ?76 year old person living with stage 4 lung cancer previously treated with palliative chemotherapy, now off treatment the last 6 weeks due to side effects. I last saw her 4 weeks ago when she had acute pancolitis, these symptoms have now resolved. No further diarrhea, does have some abdominal discomfort. She spends most of her days at home, her husband works 7a-7p. She needs help with some mobility, and getting into the bath. She reports she does have a toilet chair and shower chair, also has a rolling walker but unable to get it out of the house, uses a cane to walk outside. She had one fall about 4 weeks ago and had a knot on her head, did not call our office, no further pain since then. She is taking her other medications ok, no side effects. She would like more help at home, no other family in the area that can help her right now. Has talked with our CCM team about this in the past, but seems to be stuck without Medicaid to pay for PCS.  ? ?__________________________________________________________ ? ?Problem List: ?Patient Active Problem List  ? Diagnosis Date Noted  ? Stage IV squamous cell carcinoma of right lung ([place]) 12/02/2018  ?  Priority: High  ? Abdominal aortic aneurysm (AAA) 35 to 39 mm in diameter 11/05/2018  ?  Priority: High  ? COPD (chronic obstructive pulmonary disease) ([place]) 09/23/2016  ?  Priority: High  ? Essential hypertension 04/17/2015  ?  Priority: High  ? Major depressive disorder, recurrent episode ([place]) 04/17/2015  ?  Priority: High  ? Cancer associated pain 12/30/2018  ?  Priority: Medium   ? Glucose intolerance 10/02/2015  ?  Priority: Medium   ? Adenomatous colon polyp 10/02/2015  ?  Priority: Medium   ? Osteoarthritis cervical spine 05/26/2013  ?  Priority: Medium   ? GERD 06/25/2007  ?  Priority: Medium   ? Goals of  care, counseling/discussion 12/02/2018  ?  Priority: Low  ? BPPV (benign paroxysmal positional vertigo) 12/02/2016  ?  Priority: Low  ? OSA (obstructive sleep apnea) 10/02/2015  ?  Priority: Low  ? Hyperlipidemia 07/29/2006  ?  Priority: Low  ? Port-A-Cath in place 07/07/2019  ? Encounter for antineoplastic chemotherapy 12/02/2018  ? Encounter for antineoplastic immunotherapy 12/02/2018  ? ? ?Medications: Reconciled today in Epic ?__________________________________________________________ ? ?Physical Exam:  ?Vital Signs: ?Vitals:  ? 06/11/21 0936  ?BP: 123/77  ?Pulse: 84  ?Temp: 98.3 ?F (36.8 ?C)  ?TempSrc: Oral  ?SpO2: 96%  ?Weight: 139 lb 12.8 oz (63.4 kg)  ?Height: 5' 1"  (1.549 m)  ? ? ?Gen: Well appearing, NAD ?Neck: No cervical LAD, No thyromegaly or nodules. ?CV: RRR, no murmurs ?Pulm: Normal effort, CTA throughout, no wheezing ?Abd: Soft, NT, NS ?Ext: Warm, compression socks in place, 1+ pitting edema on the right, trade edema on the left ? ? ?

## 2021-06-11 NOTE — Addendum Note (Signed)
Addended by: Lalla Brothers T on: 06/11/2021 11:20 AM ? ? Modules accepted: Orders ? ?

## 2021-06-11 NOTE — Addendum Note (Signed)
Addended by: Lalla Brothers T on: 06/11/2021 11:18 AM ? ? Modules accepted: Orders ? ?

## 2021-06-12 ENCOUNTER — Other Ambulatory Visit: Payer: Self-pay | Admitting: Physician Assistant

## 2021-06-13 ENCOUNTER — Inpatient Hospital Stay: Payer: Medicare HMO

## 2021-06-13 ENCOUNTER — Inpatient Hospital Stay: Payer: Medicare HMO | Admitting: Physician Assistant

## 2021-06-15 ENCOUNTER — Inpatient Hospital Stay: Payer: Medicare HMO

## 2021-06-22 ENCOUNTER — Ambulatory Visit (INDEPENDENT_AMBULATORY_CARE_PROVIDER_SITE_OTHER): Payer: Medicare HMO | Admitting: Student in an Organized Health Care Education/Training Program

## 2021-06-22 ENCOUNTER — Encounter: Payer: Self-pay | Admitting: Student in an Organized Health Care Education/Training Program

## 2021-06-22 DIAGNOSIS — Z0000 Encounter for general adult medical examination without abnormal findings: Secondary | ICD-10-CM | POA: Diagnosis not present

## 2021-06-22 NOTE — Progress Notes (Signed)
? ?Subjective:  ? Rinoa Garramone is a 76 y.o. female who presents for an Initial Medicare Annual Wellness Visit. ?I connected with  Aneyah Lortz on 06/22/21 by a audio enabled telemedicine application and verified that I am speaking with the correct person using two identifiers. ? ?Patient Location: Home ? ?Provider Location: Office/Clinic ? ?I discussed the limitations of evaluation and management by telemedicine. The patient expressed understanding and agreed to proceed.  ?Review of Systems    ?Defer to PCP ?  ? ?   ?Objective:  ?  ?Today's Vitals  ? 06/22/21 1407  ?PainSc: 0-No pain  ? ?There is no height or weight on file to calculate BMI. ? ? ?  06/22/2021  ?  2:18 PM 06/11/2021  ?  1:23 PM 05/23/2021  ? 10:59 AM 05/11/2021  ?  9:55 AM 05/07/2021  ?  9:57 AM 04/11/2021  ?  9:24 AM 02/28/2021  ? 10:47 AM  ?Advanced Directives  ?Does Patient Have a Medical Advance Directive? No No   No No No  ?Would patient like information on creating a medical advance directive? No - Patient declined No - Patient declined No - Patient declined No - Patient declined No - Patient declined No - Patient declined No - Patient declined  ? ? ?Current Medications (verified) ?Outpatient Encounter Medications as of 06/22/2021  ?Medication Sig  ? acetaminophen (TYLENOL) 500 MG tablet Take 500 mg by mouth every 6 (six) hours as needed for moderate pain.  ? albuterol (VENTOLIN HFA) 108 (90 Base) MCG/ACT inhaler Inhale 2 puffs into the lungs every 6 (six) hours as needed for wheezing or shortness of breath.  ? amLODipine (NORVASC) 10 MG tablet TAKE 1 TABLET EVERY DAY  ? atorvastatin (LIPITOR) 20 MG tablet TAKE 1 TABLET EVERY DAY  ? famotidine (PEPCID) 20 MG tablet Take 20 mg by mouth daily.   ? FLUoxetine (PROZAC) 20 MG capsule TAKE 1 CAPSULE EVERY DAY (Patient taking differently: Take 20 mg by mouth daily.)  ? gabapentin (NEURONTIN) 100 MG capsule Take 1 capsule (100 mg total) by mouth 2 (two) times daily.  ? lidocaine-prilocaine (EMLA) cream Apply  1 application topically as needed.  ? magnesium oxide (MAG-OX) 400 (240 Mg) MG tablet TAKE 1 TABLET BY MOUTH EVERY DAY  ? polyvinyl alcohol (LIQUIFILM TEARS) 1.4 % ophthalmic solution Place 1 drop into both eyes as needed for dry eyes.  ? tiotropium (SPIRIVA HANDIHALER) 18 MCG inhalation capsule Place 1 capsule (18 mcg total) into inhaler and inhale daily.  ? fluticasone (FLONASE) 50 MCG/ACT nasal spray Place 1 spray into both nostrils daily.  ? ?No facility-administered encounter medications on file as of 06/22/2021.  ? ? ?Allergies (verified) ?Codeine sulfate and Pantoprazole sodium  ? ?History: ?Past Medical History:  ?Diagnosis Date  ? AAA (abdominal aortic aneurysm)   ? COPD (chronic obstructive pulmonary disease) ([place])   ? Depression   ? DVT (deep venous thrombosis) ([place]) 07/12/2019  ? Essential hypertension   ? Headache   ? History of migraine headaches   ? lung ca dx'd 10/2018  ? Sleep apnea   ? Tobacco use disorder   ? ?Past Surgical History:  ?Procedure Laterality Date  ? ABDOMINAL HYSTERECTOMY    ? CHOLECYSTECTOMY    ? IR IMAGING GUIDED PORT INSERTION  02/24/2019  ? ROTATOR CUFF REPAIR  3/04  ? VIDEO BRONCHOSCOPY WITH ENDOBRONCHIAL ULTRASOUND Right 11/25/2018  ? Procedure: VIDEO BRONCHOSCOPY WITH ENDOBRONCHIAL ULTRASOUND;  Surgeon: Collene Gobble, MD;  Location: [facility];  Service: Thoracic;  Laterality: Right;  ? ?Family History  ?Problem Relation Age of Onset  ? Hypertension Mother   ? Stroke Mother   ? Coronary artery disease Mother   ? Heart disease Father   ? Diabetes Sister   ? Hypertension Sister   ? Cancer Sister   ? Breast cancer Sister   ? ?Social History  ? ?Socioeconomic History  ? Marital status: Married  ?  Spouse name: Not on file  ? Number of children: Not on file  ? Years of education: Not on file  ? Highest education level: Not on file  ?Occupational History  ? Not on file  ?Tobacco Use  ? Smoking status: Former  ?  Packs/day: 1.00  ?  Years: 56.00  ?  Pack years: 56.00  ?  Types: Cigarettes  ?   Start date: 1964  ?  Quit date: 11/04/2018  ?  Years since quitting: 2.6  ? Smokeless tobacco: Never  ?Vaping Use  ? Vaping Use: Never used  ?Substance and Sexual Activity  ? Alcohol use: No  ?  Alcohol/week: 0.0 standard drinks  ? Drug use: No  ? Sexual activity: Never  ?  Partners: Male  ?Other Topics Concern  ? Not on file  ?Social History Narrative  ? Married, Regular Exercise- yes  ? ?Social Determinants of Health  ? ?Financial Resource Strain: Low Risk   ? Difficulty of Paying Living Expenses: Not hard at all  ?Food Insecurity: No Food Insecurity  ? Worried About Charity fundraiser in the Last Year: Never true  ? Ran Out of Food in the Last Year: Never true  ?Transportation Needs: No Transportation Needs  ? Lack of Transportation (Medical): No  ? Lack of Transportation (Non-Medical): No  ?Physical Activity: Sufficiently Active  ? Days of Exercise per Week: 7 days  ? Minutes of Exercise per Session: 60 min  ?Stress: Stress Concern Present  ? Feeling of Stress : Very much  ?Social Connections: Moderately Isolated  ? Frequency of Communication with Friends and Family: Three times a week  ? Frequency of Social Gatherings with Friends and Family: Never  ? Attends Religious Services: Never  ? Active Member of Clubs or Organizations: No  ? Attends Archivist Meetings: Never  ? Marital Status: Married  ? ? ?Tobacco Counseling ?Counseling given: Not Answered ? ? ?Clinical Intake: ? ?Pre-visit preparation completed: Yes ? ?Pain : No/denies pain ?Pain Score: 0-No pain ? ?  ? ?Nutritional Risks: None ?Diabetes: No ? ?How often do you need to have someone help you when you read instructions, pamphlets, or other written materials from your doctor or pharmacy?: 1 - Never ?What is the last grade level you completed in school?: 9th grade ? ?Diabetic?No ? ?Interpreter Needed?: No ? ?Information entered by :: Midge Minium 06/22/2021 2:08pm ? ? ?Activities of Daily Living ? ?  06/22/2021  ?  2:17 PM 06/11/2021  ? 11:23  AM  ?In your present state of health, do you have any difficulty performing the following activities:  ?Hearing? 0 0  ?Vision? 1 0  ?Difficulty concentrating or making decisions? 1 0  ?Walking or climbing stairs? 1 1  ?Dressing or bathing? 1 1  ?Doing errands, shopping? 1 1  ? ? ?Patient Care Team: ?[name], MD as PCP - General (Internal Medicine) ?Drema Pry as Social Worker ? ?Indicate any recent Medical Services you may have received from other than [facility] providers in the past year (  date may be approximate). ? ?   ?Assessment:  ? This is a routine wellness examination for Kamariyah. ? ?Hearing/Vision screen ?No results found. ? ?Dietary issues and exercise activities discussed: ?  ? ? Goals Addressed   ?None ?  ?Depression Screen ? ?  06/22/2021  ?  2:11 PM 06/11/2021  ? 11:25 AM 05/11/2021  ? 10:50 AM 05/07/2021  ?  9:59 AM 02/05/2021  ?  8:49 AM 07/10/2020  ? 12:05 PM 12/27/2019  ?  9:43 AM  ?PHQ 2/9 Scores  ?PHQ - 2 Score 1 0 6 0 2 0 2  ?PHQ- 9 Score 14  21  11  9   ?  ?Fall Risk ? ?  06/22/2021  ?  2:17 PM 06/11/2021  ? 11:23 AM 05/11/2021  ?  9:54 AM 05/07/2021  ?  9:40 AM 02/05/2021  ?  8:49 AM  ?Fall Risk   ?Falls in the past year? 1 1 0 0 0  ?Number falls in past yr: 1 1 0 0 0  ?Injury with Fall? 1 0 0 0 0  ?Risk for fall due to : Impaired balance/gait Impaired balance/gait Impaired balance/gait Impaired balance/gait No Fall Risks  ?Follow up Falls evaluation completed;Falls prevention discussed Falls evaluation completed Falls evaluation completed;Falls prevention discussed Falls evaluation completed Falls evaluation completed;Falls prevention discussed  ? ? ?FALL RISK PREVENTION PERTAINING TO THE HOME: ? ?Any stairs in or around the home? Yes  ?If so, are there any without handrails? No  ?Home free of loose throw rugs in walkways, pet beds, electrical cords, etc? No  ?Adequate lighting in your home to reduce risk of falls? Yes  ? ?ASSISTIVE DEVICES UTILIZED TO PREVENT FALLS: ? ?Life alert? No  ?Use of  a cane, walker or w/c? Yes  ?Grab bars in the bathroom? Yes  ?Shower chair or bench in shower? Yes  ?Elevated toilet seat or a handicapped toilet? No  ? ?TIMED UP AND GO: ? ?Was the test performed

## 2021-06-28 NOTE — Progress Notes (Signed)
I discussed the AWV findings with the provider who conducted the visit. I was present in the office suite and immediately available to provide assistance and direction throughout the time the service was provided.  ?

## 2021-07-09 ENCOUNTER — Telehealth: Payer: Self-pay

## 2021-07-09 NOTE — Telephone Encounter (Signed)
Pt is requesting a call back ... she stated that her  legs and feet have been swollen since Friday with pain .. I gave  an appt with Dr  Marlou Sa tomorrow  just in case you need her to be seen  ?

## 2021-07-09 NOTE — Telephone Encounter (Signed)
Returned call to patient. No answer. Left message on VM that I had returned her call, and to relay she has been given an appt on 4/20 at [place].  ? ?  ? ?

## 2021-07-12 ENCOUNTER — Other Ambulatory Visit: Payer: Self-pay

## 2021-07-12 ENCOUNTER — Ambulatory Visit (HOSPITAL_COMMUNITY)
Admission: RE | Admit: 2021-07-12 | Discharge: 2021-07-12 | Disposition: A | Discharge status: Home / Self Care | Payer: Medicare HMO | Source: Ambulatory Visit | Attending: Internal Medicine | Admitting: Internal Medicine

## 2021-07-12 ENCOUNTER — Ambulatory Visit (INDEPENDENT_AMBULATORY_CARE_PROVIDER_SITE_OTHER): Payer: Medicare HMO | Admitting: Internal Medicine

## 2021-07-12 ENCOUNTER — Encounter: Payer: Self-pay | Admitting: Internal Medicine

## 2021-07-12 ENCOUNTER — Other Ambulatory Visit: Payer: Self-pay | Admitting: Internal Medicine

## 2021-07-12 VITALS — BP 118/89 | HR 87 | Temp 98.4°F | Ht 60.0 in | Wt 147.0 lb

## 2021-07-12 DIAGNOSIS — I517 Cardiomegaly: Secondary | ICD-10-CM | POA: Diagnosis not present

## 2021-07-12 DIAGNOSIS — R2232 Localized swelling, mass and lump, left upper limb: Secondary | ICD-10-CM | POA: Diagnosis not present

## 2021-07-12 DIAGNOSIS — R600 Localized edema: Secondary | ICD-10-CM | POA: Insufficient documentation

## 2021-07-12 DIAGNOSIS — C3490 Malignant neoplasm of unspecified part of unspecified bronchus or lung: Secondary | ICD-10-CM | POA: Diagnosis not present

## 2021-07-12 DIAGNOSIS — R590 Localized enlarged lymph nodes: Secondary | ICD-10-CM

## 2021-07-12 DIAGNOSIS — M79604 Pain in right leg: Secondary | ICD-10-CM | POA: Diagnosis not present

## 2021-07-12 DIAGNOSIS — N83202 Unspecified ovarian cyst, left side: Secondary | ICD-10-CM | POA: Diagnosis not present

## 2021-07-12 DIAGNOSIS — K5730 Diverticulosis of large intestine without perforation or abscess without bleeding: Secondary | ICD-10-CM | POA: Diagnosis not present

## 2021-07-12 DIAGNOSIS — M79605 Pain in left leg: Secondary | ICD-10-CM

## 2021-07-12 DIAGNOSIS — I712 Thoracic aortic aneurysm, without rupture, unspecified: Secondary | ICD-10-CM | POA: Diagnosis not present

## 2021-07-12 DIAGNOSIS — J9811 Atelectasis: Secondary | ICD-10-CM | POA: Diagnosis not present

## 2021-07-12 DIAGNOSIS — I714 Abdominal aortic aneurysm, without rupture, unspecified: Secondary | ICD-10-CM | POA: Diagnosis not present

## 2021-07-12 DIAGNOSIS — J90 Pleural effusion, not elsewhere classified: Secondary | ICD-10-CM | POA: Diagnosis not present

## 2021-07-12 LAB — POCT I-STAT CREATININE: Creatinine, Ser: 0.8 mg/dL (ref 0.44–1.00)

## 2021-07-12 MED ORDER — IOHEXOL 9 MG/ML PO SOLN
ORAL | Status: AC
  Filled 2021-07-12: qty 1000

## 2021-07-12 MED ORDER — HEPARIN SOD (PORK) LOCK FLUSH 100 UNIT/ML IV SOLN
INTRAVENOUS | Status: AC
  Filled 2021-07-12: qty 5

## 2021-07-12 MED ORDER — IOHEXOL 300 MG/ML  SOLN
100.0000 mL | Freq: Once | INTRAMUSCULAR | Status: AC | PRN
  Administered 2021-07-12: 100 mL via INTRAVENOUS

## 2021-07-12 NOTE — Patient Instructions (Signed)
It was nice seeing you today! Thank you for choosing [facility] for your Primary Care.  ?  ?Today we talked about your new leg swelling, as well as the increased size of your left arm, left breast, and how your stomach feels larger in your clothes. I also felt a small lump in your left arm pit. ? ?All of these findings make me worried about a blockage of the large duct that drains lymph fluid from your legs, stomach, and left side of your body. I am also worried about the lump in your left arm pit and want to make sure there is no new malignancy in the breast. ? ?I have ordered a mammogram to assess the breasts for malignancy and you will get a call to get this scheduled. I have also asked my front desk team to help me move the imaging of your chest, abdomen, and pelvis up sooner to make sure I don't see any concerning masses or large lymph nodes that could be causing your problems. ? ?I will reach out to your oncologist to let him know about our visit. I will also have our front desk schedule you for a follow-up appointment with Dr. Evette Doffing. ? ?Remember: If you have any questions or concerns, please call our clinic at [phone] between 9am-5pm and after hours call [phone] and ask for the internal medicine resident on call. If you feel you are having a medical emergency please call 911. ? ?Farrel Gordon, DO ? ?

## 2021-07-12 NOTE — Assessment & Plan Note (Signed)
Patient noted on physical exam to have bilateral axillary tenderness with a small, roughly 1 cm firm, round, mobile, and tender left axillary lymph node near the mid axillary line.  She denies ever noticing this being present. ?Assessment: Given patient's history of malignancy and new axillary lymphadenopathy I am concerned for metastatic disease. ?Plan: I have ordered diagnostic mammogram. ?

## 2021-07-12 NOTE — Assessment & Plan Note (Addendum)
Patient presents today with the complaint of new onset bilateral lower extremity edema that she feels is hard that she noted last Friday.  She says that it gets better overnight with elevation of the extremities but it comes right back throughout the day.  She does have associated pain with the edema. ? ?In addition to lower extremity edema she does report feeling like her abdomen has gotten larger and that her shirts are feeling tighter.  Her left breast is also much larger than the right which is new, and her right arm is larger than the left which is new.  On chart review I see that she has gained about 8 pounds over the course of the last month and this was not intentional. ? ?She has not had worsening of paroxysmal nocturnal dyspnea but does have some worsened exertional shortness of breath.  She denies palpitations or shortness of breath at rest.  She has no recent medication changes. ? ?She did have an echocardiogram done in July 2021 which did not have any overt abnormalities, LV EF at that time was 65 to 70%.  She has a history of AAA 35 to 39 mm in diameter. ? ?Physical exam is remarkable for edema of the right arm compared to left, right breast compared to left, and bilateral lower extremity edema with 2+ pitting to the knees.  Cardiopulmonary exam was normal. ?Assessment: The distribution of her edema closely follows that of the thoracic duct drainage distribution.  Given her history of malignancy I am concerned that she may have a compression of the thoracic duct possibly by lymphadenopathy or mass leading to inability of lymph for the distribution to drain.  I am less concerned about expansion of her AAA as she is hemodynamically stable and is not having pain that would be expected. ?Plan: I have placed orders for CT chest/abdomen/pelvis as stat to evaluate for intrathoracic changes that could be contributing to her clinical picture.  These were originally ordered for May with her oncology team,  however would like to assess for more urgent changes prior to that time.  I will contact her oncology team and let them know that we have seen her for this reason. ? ?ADDENDUM: CT chest/abdomen/pelvis obtained stat did not show any abnormalities that would explain Ms. Sawyer bilateral LE edema and LUE and L breast edema. Findings, which were routed to her vascular surgeon as there was an increase in her abdominal aortic aneurysm to 4.8 cm from 4.5 cm, as well as her oncologist, were: ?1. No evidence for recurrent or metastatic disease. ?2. Stable left pleural effusion with lingular and left lower lobe atelectasis. ?3. Stable cardiomegaly. ?4. Abdominal aortic aneurysm has mildly increased in size now measuring up to 4.8 cm. 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 Recommend follow-up every 6 months and vascular consultation. Reference: J Am Coll Radiol 3570;[id number]:[id number]. ?5. Mild rectosigmoid wall thickening versus normal under distension. Correlate clinically for proctocolitis. ?6. Stable 7.2 cm left adnexal cystic structure. ?7. Anasarca. ?8. Colonic diverticulosis. ? ?Patient has been informed of these results and has no follow up questions. No change in management at this time. ? ?ADDENDUM: Will have patient come in for lab-only visit for CBC, CMP, BNP, and urine protein studies given general anasarca noted on imaging alongside her clinical presentation. ?

## 2021-07-12 NOTE — Progress Notes (Addendum)
? ?CC: Leg swelling ? ?HPI: ? ?DebraDebra Rodgers is a 76 y.o. female with past medical history as stated below who presents today for 3 days of new onset bilateral leg swelling.  Please see problem charting for detailed assessment and plan. ? ?Past Medical History:  ?Diagnosis Date  ? AAA (abdominal aortic aneurysm) ([place])   ? COPD (chronic obstructive pulmonary disease) ([place])   ? Depression   ? DVT (deep venous thrombosis) ([place]) 07/12/2019  ? Encounter for antineoplastic chemotherapy 12/02/2018  ? Encounter for antineoplastic immunotherapy 12/02/2018  ? Essential hypertension   ? Goals of care, counseling/discussion 12/02/2018  ? MOST form completed 07/12/19:  DNR/DNI but full scope of usual care. Available in [facility]  ? Headache   ? History of migraine headaches   ? lung ca dx'd 10/2018  ? Sleep apnea   ? Tobacco use disorder   ? ?Review of Systems:   ?Review of Systems  ?Constitutional:  Negative for chills, fever and weight loss.  ?Respiratory:  Negative for cough and shortness of breath.   ?Cardiovascular:  Positive for orthopnea and leg swelling. Negative for chest pain and PND.  ?Gastrointestinal:  Negative for abdominal pain.  ?Musculoskeletal:  Negative for falls and joint pain.  ?Neurological:  Positive for weakness. Negative for focal weakness.  ? ? ?Physical Exam: ? ?Vitals:  ? 07/12/21 0855  ?BP: 118/89  ?Pulse: 87  ?Temp: 98.4 ?F (36.9 ?C)  ?TempSrc: Oral  ?SpO2: 93%  ?Weight: 147 lb (66.7 kg)  ?Height: 5' (1.524 m)  ? ?Constitutional: Chronically ill female in no acute distress. ?Cardio: Regular rate and rhythm. ?Pulm: Clear to auscultation bilaterally. ?Breast: Exam performed with chaperone present.  No abnormal masses or nodules palpated in either breast.  Left breast is nearly double the size of the right breast and edematous, but not painful. No skin or nipple changes noted. ?Abdomen: Soft, nontender, nondistended. ?MSK: Bilateral lower extremities with 2+ pitting edema to the knees.  LUE is edematous  noticeably larger in size as compared to the RUE. ?Skin:Warm and dry without texture changes. ?Neuro:Alert and oriented, no focal deficit noted. ?Psych:Normal mood and affect. ? ?Assessment & Plan:  ? ?Edema of both lower extremities ?Patient presents today with the complaint of new onset bilateral lower extremity edema that she feels is hard that she noted last Friday.  She says that it gets better overnight with elevation of the extremities but it comes right back throughout the day.  She does have associated pain with the edema. ? ?In addition to lower extremity edema she does report feeling like her abdomen has gotten larger and that her shirts are feeling tighter.  Her left breast is also much larger than the right which is new, and her right arm is larger than the left which is new.  On chart review I see that she has gained about 8 pounds over the course of the last month and this was not intentional. ? ?She has not had worsening of paroxysmal nocturnal dyspnea but does have some worsened exertional shortness of breath.  She denies palpitations or shortness of breath at rest.  She has no recent medication changes. ? ?She did have an echocardiogram done in July 2021 which did not have any overt abnormalities, LV EF at that time was 65 to 70%.  She has a history of AAA 35 to 39 mm in diameter. ? ?Physical exam is remarkable for edema of the right arm compared to left, right breast compared to left,  and bilateral lower extremity edema with 2+ pitting to the knees.  Cardiopulmonary exam was normal. ?Assessment: The distribution of her edema closely follows that of the thoracic duct drainage distribution.  Given her history of malignancy I am concerned that she may have a compression of the thoracic duct possibly by lymphadenopathy or mass leading to inability of lymph for the distribution to drain.  I am less concerned about expansion of her AAA as she is hemodynamically stable and is not having pain that would  be expected. ?Plan: I have placed orders for CT chest/abdomen/pelvis as stat to evaluate for intrathoracic changes that could be contributing to her clinical picture.  These were originally ordered for May with her oncology team, however would like to assess for more urgent changes prior to that time.  I will contact her oncology team and let them know that we have seen her for this reason. ? ?ADDENDUM: CT chest/abdomen/pelvis obtained stat did not show any abnormalities that would explain Debra Rodgers bilateral LE edema and LUE and L breast edema. Findings, which were routed to her vascular surgeon as there was an increase in her abdominal aortic aneurysm to 4.8 cm from 4.5 cm, as well as her oncologist, were: ?1. No evidence for recurrent or metastatic disease. ?2. Stable left pleural effusion with lingular and left lower lobe atelectasis. ?3. Stable cardiomegaly. ?4. Abdominal aortic aneurysm has mildly increased in size now measuring up to 4.8 cm. 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 Recommend follow-up every 6 months and vascular consultation. Reference: J Am Coll Radiol [id number]. ?5. Mild rectosigmoid wall thickening versus normal under distension. Correlate clinically for proctocolitis. ?6. Stable 7.2 cm left adnexal cystic structure. ?7. Anasarca. ?8. Colonic diverticulosis. ? ?Patient has been informed of these results and has no follow up questions. No change in management at this time. ? ?Axillary lump, left ?Patient noted on physical exam to have bilateral axillary tenderness with a small, roughly 1 cm firm, round, mobile, and tender left axillary lymph node near the mid axillary line.  She denies ever noticing this being present. ?Assessment: Given patient's history of malignancy and new axillary lymphadenopathy I am concerned for metastatic disease. ?Plan: I have ordered diagnostic mammogram. ? ?See Encounters Tab for problem based charting. ? ?Patient discussed  with Dr.  Saverio Danker ? ?

## 2021-07-16 NOTE — Addendum Note (Signed)
Addended by: Renato Battles on: 07/16/2021 04:00 PM ? ? Modules accepted: Orders ? ?

## 2021-07-17 NOTE — Progress Notes (Signed)
Internal Medicine Clinic Attending ? ?Case discussed with Dr. Dean  At the time of the visit.  We reviewed the resident?s history and exam and pertinent patient test results.  I agree with the assessment, diagnosis, and plan of care documented in the resident?s note.  ?

## 2021-07-20 ENCOUNTER — Other Ambulatory Visit: Payer: Self-pay | Admitting: Internal Medicine

## 2021-07-20 DIAGNOSIS — R2232 Localized swelling, mass and lump, left upper limb: Secondary | ICD-10-CM

## 2021-07-23 ENCOUNTER — Telehealth: Payer: Self-pay | Admitting: *Deleted

## 2021-07-23 MED ORDER — FUROSEMIDE 40 MG PO TABS
40.0000 mg | ORAL_TABLET | Freq: Every day | ORAL | 1 refills | Status: DC
Start: 2021-07-23 — End: 2021-08-09

## 2021-07-23 NOTE — Telephone Encounter (Signed)
Patient called in c/o continued LE edema and SHOB. States it is difficult to walk more than 20 feet 2/2 edema. Patient was seen for this on 4/20 by Yellow Team Provider, and had CT scan. Was scheduled for lab only appt for 5/4. Instead she has been given first available appt with Provider for 5/3 at 1315. ?

## 2021-07-23 NOTE — Telephone Encounter (Signed)
Thank you Lauren. I reviewed the note from that visit and the CT scan showing diffuse edema. Visit this week is a good idea for some labs. Would like to see if she responds to oral Lasix. I sent an Rx for Lasix 11m tablet, take one daily and lets see how she responds. Can you let her know about that new med? ?

## 2021-07-23 NOTE — Telephone Encounter (Signed)
Patient notified of info below. She will have someone p/u lasix Rx today and start taking daily tomorrow AM. She will keep appt for 5/3. ?

## 2021-07-25 ENCOUNTER — Encounter: Payer: Self-pay | Admitting: Internal Medicine

## 2021-07-25 ENCOUNTER — Inpatient Hospital Stay: Payer: Medicare HMO | Attending: Internal Medicine

## 2021-07-25 ENCOUNTER — Ambulatory Visit (INDEPENDENT_AMBULATORY_CARE_PROVIDER_SITE_OTHER): Payer: Medicare HMO | Admitting: Internal Medicine

## 2021-07-25 ENCOUNTER — Other Ambulatory Visit: Payer: Self-pay

## 2021-07-25 VITALS — BP 118/65 | HR 85 | Temp 98.0°F | Wt 156.5 lb

## 2021-07-25 DIAGNOSIS — I119 Hypertensive heart disease without heart failure: Secondary | ICD-10-CM | POA: Diagnosis not present

## 2021-07-25 DIAGNOSIS — Z87891 Personal history of nicotine dependence: Secondary | ICD-10-CM | POA: Insufficient documentation

## 2021-07-25 DIAGNOSIS — I7123 Aneurysm of the descending thoracic aorta, without rupture: Secondary | ICD-10-CM | POA: Insufficient documentation

## 2021-07-25 DIAGNOSIS — Z79899 Other long term (current) drug therapy: Secondary | ICD-10-CM | POA: Insufficient documentation

## 2021-07-25 DIAGNOSIS — Z86718 Personal history of other venous thrombosis and embolism: Secondary | ICD-10-CM | POA: Insufficient documentation

## 2021-07-25 DIAGNOSIS — I714 Abdominal aortic aneurysm, without rupture, unspecified: Secondary | ICD-10-CM | POA: Diagnosis not present

## 2021-07-25 DIAGNOSIS — C3411 Malignant neoplasm of upper lobe, right bronchus or lung: Secondary | ICD-10-CM | POA: Diagnosis not present

## 2021-07-25 DIAGNOSIS — J439 Emphysema, unspecified: Secondary | ICD-10-CM | POA: Diagnosis not present

## 2021-07-25 DIAGNOSIS — J90 Pleural effusion, not elsewhere classified: Secondary | ICD-10-CM | POA: Diagnosis not present

## 2021-07-25 DIAGNOSIS — R600 Localized edema: Secondary | ICD-10-CM | POA: Diagnosis not present

## 2021-07-25 DIAGNOSIS — C782 Secondary malignant neoplasm of pleura: Secondary | ICD-10-CM | POA: Insufficient documentation

## 2021-07-25 DIAGNOSIS — K5730 Diverticulosis of large intestine without perforation or abscess without bleeding: Secondary | ICD-10-CM | POA: Diagnosis not present

## 2021-07-25 DIAGNOSIS — C3491 Malignant neoplasm of unspecified part of right bronchus or lung: Secondary | ICD-10-CM

## 2021-07-25 DIAGNOSIS — G4730 Sleep apnea, unspecified: Secondary | ICD-10-CM | POA: Insufficient documentation

## 2021-07-25 LAB — CBC WITH DIFFERENTIAL (CANCER CENTER ONLY)
Abs Immature Granulocytes: 0 10*3/uL (ref 0.00–0.07)
Basophils Absolute: 0 10*3/uL (ref 0.0–0.1)
Basophils Relative: 1 %
Eosinophils Absolute: 0.1 10*3/uL (ref 0.0–0.5)
Eosinophils Relative: 2 %
HCT: 37.6 % (ref 36.0–46.0)
Hemoglobin: 12.1 g/dL (ref 12.0–15.0)
Immature Granulocytes: 0 %
Lymphocytes Relative: 46 %
Lymphs Abs: 1.5 10*3/uL (ref 0.7–4.0)
MCH: 31.2 pg (ref 26.0–34.0)
MCHC: 32.2 g/dL (ref 30.0–36.0)
MCV: 96.9 fL (ref 80.0–100.0)
Monocytes Absolute: 0.3 10*3/uL (ref 0.1–1.0)
Monocytes Relative: 11 %
Neutro Abs: 1.2 10*3/uL — ABNORMAL LOW (ref 1.7–7.7)
Neutrophils Relative %: 40 %
Platelet Count: 160 10*3/uL (ref 150–400)
RBC: 3.88 MIL/uL (ref 3.87–5.11)
RDW: 16.9 % — ABNORMAL HIGH (ref 11.5–15.5)
WBC Count: 3.1 10*3/uL — ABNORMAL LOW (ref 4.0–10.5)
nRBC: 0 % (ref 0.0–0.2)

## 2021-07-25 LAB — CMP (CANCER CENTER ONLY)
ALT: 9 U/L (ref 0–44)
AST: 14 U/L — ABNORMAL LOW (ref 15–41)
Albumin: 3.4 g/dL — ABNORMAL LOW (ref 3.5–5.0)
Alkaline Phosphatase: 37 U/L — ABNORMAL LOW (ref 38–126)
Anion gap: 7 (ref 5–15)
BUN: 26 mg/dL — ABNORMAL HIGH (ref 8–23)
CO2: 28 mmol/L (ref 22–32)
Calcium: 9.3 mg/dL (ref 8.9–10.3)
Chloride: 104 mmol/L (ref 98–111)
Creatinine: 0.98 mg/dL (ref 0.44–1.00)
GFR, Estimated: >60 mL/min (ref >60)
Glucose, Bld: 91 mg/dL (ref 70–99)
Potassium: 3.6 mmol/L (ref 3.5–5.1)
Sodium: 139 mmol/L (ref 135–145)
Total Bilirubin: 1.1 mg/dL (ref 0.3–1.2)
Total Protein: 6.1 g/dL — ABNORMAL LOW (ref 6.5–8.1)

## 2021-07-25 LAB — BRAIN NATRIURETIC PEPTIDE: B Natriuretic Peptide: 34.3 pg/mL (ref 0.0–100.0)

## 2021-07-25 LAB — TOTAL PROTEIN, URINE DIPSTICK: Protein, ur: <30 mg/dL

## 2021-07-25 NOTE — Progress Notes (Signed)
? ?  CC: Lower extremity swelling ? ?HPI: ? ?[name] is a 76 y.o. person, with a PMH noted below, who presents to the clinic lower extremity swelling. To see the management of their acute and chronic conditions, please see the A&P note under the Encounters tab.  ? ?Past Medical History:  ?Diagnosis Date  ? AAA (abdominal aortic aneurysm) ([place])   ? COPD (chronic obstructive pulmonary disease) ([place])   ? Depression   ? DVT (deep venous thrombosis) ([place]) 07/12/2019  ? Encounter for antineoplastic chemotherapy 12/02/2018  ? Encounter for antineoplastic immunotherapy 12/02/2018  ? Essential hypertension   ? Goals of care, counseling/discussion 12/02/2018  ? MOST form completed 07/12/19:  DNR/DNI but full scope of usual care. Available in [facility]  ? Headache   ? History of migraine headaches   ? lung ca dx'd 10/2018  ? Sleep apnea   ? Tobacco use disorder   ? ?Review of Systems:   ?Review of Systems  ?Constitutional:  Negative for chills, fever, malaise/fatigue and weight loss.  ?HENT:  Negative for congestion, nosebleeds and sinus pain.   ?Respiratory:  Negative for cough.   ?Cardiovascular:  Positive for leg swelling. Negative for chest pain, palpitations and orthopnea.  ?Gastrointestinal:  Negative for abdominal pain, nausea and vomiting.  ?Neurological:  Negative for dizziness and headaches.   ? ?Physical Exam: ? ?Vitals:  ? 07/25/21 1356  ?BP: 118/65  ?Pulse: 85  ?Temp: 98 ?F (36.7 ?C)  ?TempSrc: Oral  ?SpO2: 95%  ?Weight: 156 lb 8 oz (71 kg)  ? ?Physical Exam ?Constitutional:   ?   General: She is not in acute distress. ?   Appearance: She is not ill-appearing or toxic-appearing.  ?HENT:  ?   Head: Normocephalic and atraumatic.  ?Cardiovascular:  ?   Rate and Rhythm: Normal rate and regular rhythm.  ?   Pulses: Normal pulses.  ?   Heart sounds:  ?  No friction rub. No gallop.  ?   Comments: S3 appreciated ?Pulmonary:  ?   Effort: Pulmonary effort is normal.  ?   Breath sounds: Normal breath sounds. No wheezing, rhonchi  or rales.  ?Abdominal:  ?   General: Abdomen is flat. Bowel sounds are normal.  ?   Palpations: Abdomen is soft.  ?   Tenderness: There is no abdominal tenderness. There is no guarding.  ?Musculoskeletal:  ?   Comments: 2+ pitting edema to the thighs bilaterally, right hand swelling greater than left hand  ?Neurological:  ?   Mental Status: She is alert.  ?  ? ?Assessment & Plan:  ? ?See Encounters Tab for problem based charting. ? ?Patient discussed with Dr.  Saverio Danker ? ?

## 2021-07-25 NOTE — Patient Instructions (Signed)
To Ms. Games,  ? ?It was a pleasure meeting you today. Today we discussed your swollen legs, I am concerned that your swelling is due to your heart. We are going to order some blood work today to see how your heart is doing, as well as ordering a scan to look at the structure of your heart. I want you  to continue taking Lasix 40 mg daily, and see [place] in the clinic next week as you are up 17 pounds from March of this year. We will recheck your kidney levels at your next visit. ? ?We will see you next week and it was a pleasure meeting you! ?Maudie Mercury, MD ?

## 2021-07-26 ENCOUNTER — Other Ambulatory Visit: Payer: Medicare HMO

## 2021-07-27 NOTE — Assessment & Plan Note (Addendum)
Patient presents today for follow-up on her edema.  She states that she was given furosemide, which has made hert.  She states that she felt like some of her shortness of breath was alleviated with the use of her furosemide.  She did not take furosemide today as she knew that she had to go to several doctors appointments. Additionally, she feels like her clothes are still tight fitting, and then her waistline is expanding more than it was from her prior OV.  She states that some of her labs were drawn by her oncologist, which the [facility] appreciates.  She denies any orthopnea, dyspnea, fatigue, rashes, or other lesions. ? ?A/P: ?Patient presents for follow-up for lower extremity edema.  She does have 2+ pitting edema up to mid thighs, and she has gained a total of 17 pounds from her prior  2 visits. She was prescribed furosemide 40 mg Lasix by her primary care physician, which seems to have alleviated some of her dyspnea.  Further discussed her labs that were drawn by her oncologist.  Her albumin is low normal, at 3.4.  And her urine protein was less than 30.  Whileher albumin is low normal, but I would not expect this degree of edema. There was a new S3 appreciated on physical examination today, ECHO in 2021 showed EF 65-70% with grade I diastolic dysfunciton. She was on docetaxel for her cancer, which can have side effects that are cardiogenic in nature.  Given new edema, will further evaluate with an echocardiogram, and continue furosemide 40 mg daily for the next week and have patient follow back up.  ?- Echocardiogram ?- BNP ?- Continue Lasix 40 mg daily ?- OV in 1 week with follow-up BMP ?

## 2021-08-01 ENCOUNTER — Inpatient Hospital Stay (HOSPITAL_BASED_OUTPATIENT_CLINIC_OR_DEPARTMENT_OTHER): Payer: Medicare HMO | Admitting: Internal Medicine

## 2021-08-01 ENCOUNTER — Other Ambulatory Visit: Payer: Self-pay

## 2021-08-01 ENCOUNTER — Encounter: Payer: Self-pay | Admitting: Internal Medicine

## 2021-08-01 ENCOUNTER — Ambulatory Visit (INDEPENDENT_AMBULATORY_CARE_PROVIDER_SITE_OTHER): Payer: Medicare HMO | Admitting: Internal Medicine

## 2021-08-01 VITALS — BP 117/85 | HR 83 | Temp 96.8°F | Resp 18 | Wt 157.1 lb

## 2021-08-01 VITALS — BP 110/54 | HR 84 | Temp 98.6°F | Ht 62.0 in | Wt 157.7 lb

## 2021-08-01 DIAGNOSIS — R600 Localized edema: Secondary | ICD-10-CM | POA: Diagnosis not present

## 2021-08-01 DIAGNOSIS — C3411 Malignant neoplasm of upper lobe, right bronchus or lung: Secondary | ICD-10-CM | POA: Diagnosis not present

## 2021-08-01 DIAGNOSIS — Z87891 Personal history of nicotine dependence: Secondary | ICD-10-CM | POA: Diagnosis not present

## 2021-08-01 DIAGNOSIS — Z86718 Personal history of other venous thrombosis and embolism: Secondary | ICD-10-CM | POA: Diagnosis not present

## 2021-08-01 DIAGNOSIS — G4730 Sleep apnea, unspecified: Secondary | ICD-10-CM | POA: Diagnosis not present

## 2021-08-01 DIAGNOSIS — I714 Abdominal aortic aneurysm, without rupture, unspecified: Secondary | ICD-10-CM | POA: Diagnosis not present

## 2021-08-01 DIAGNOSIS — C3491 Malignant neoplasm of unspecified part of right bronchus or lung: Secondary | ICD-10-CM | POA: Diagnosis not present

## 2021-08-01 DIAGNOSIS — C782 Secondary malignant neoplasm of pleura: Secondary | ICD-10-CM | POA: Diagnosis not present

## 2021-08-01 DIAGNOSIS — I119 Hypertensive heart disease without heart failure: Secondary | ICD-10-CM | POA: Diagnosis not present

## 2021-08-01 DIAGNOSIS — I7123 Aneurysm of the descending thoracic aorta, without rupture: Secondary | ICD-10-CM | POA: Diagnosis not present

## 2021-08-01 DIAGNOSIS — Z79899 Other long term (current) drug therapy: Secondary | ICD-10-CM | POA: Diagnosis not present

## 2021-08-01 NOTE — Patient Instructions (Addendum)
To Ms. Gaertner,  ? ?It was a pleasure seeing you again! Today we discussed your lower extremity edema. I am going to speak with our coordinators today to see if we can expedite your heart imaging. We also noted that one of your chemotherapy drugs can cause edema, so I will reach out to your oncologist to ask if this could be the underlying cause as well. In the meantime, I would recommend obtaining compression stockings to help with your swelling. We will additionally increase your Lasix to 40 mg twice daily. Try to limit your fluid intake to 2 L a day, as this will help get some of the excess fluid off. We will see you back in the clinic next week.  ? ?Have a good day,  ?Maudie Mercury, MD ?

## 2021-08-01 NOTE — Progress Notes (Signed)
?    [facility] ?[phone]   [phone] ? ?OFFICE PROGRESS NOTE ? ?[name], MD ?[place] ?[place] [address] ? ?DIAGNOSIS: Stage IV non-small cell lung cancer, squamous cell carcinoma. She presented with right upper lobe lung mass in addition to pleural-based metastasis and mediastinal lymphadenopathy. She was diagnosed in September 2020.  ?  ?PRIOR THERAPY: Chemotherapy with carboplatin for an AUC of 5, paclitaxel 175 mg/m?, and Keytruda 200 mg IV every 3 weeks with Neulasta support. Last dose 12/08/19. Status post 18 cycles.  Starting from cycle #5 was on maintenance treatment with single agent Keytruda every 3 weeks. This was discontinued due to evidence of disease progression.  ?  ?CURRENT THERAPY: Palliative systemic chemotherapy with docetaxel 75 mg/m2 and Cyramza 10 mg/kg IV every 3 weeks with Neulasta support. First dose on 01/04/20.  Status post 24 cycles.  Starting from cycle #10 docetaxel was reduced to 65 Mg/M2.  Her treatment is currently on hold since March 2023 secondary to intolerance and pancolitis ? ?INTERVAL HISTORY: ?Debra Rodgers 76 y.o. female returns to the clinic today for follow-up visit accompanied by her husband.  The patient is feeling fine today with no concerning complaints.  The patient is feeling fine today with no concerning complaints except for fatigue and mild shortness of breath increased with exertion.  She denied having any chest pain, cough or hemoptysis.  She has no nausea, vomiting, diarrhea or constipation.  She has no headache or visual changes.  The patient has been off treatment for the last 2 months and she is feeling fine.  She had CT scan of the chest, abdomen pelvis performed few weeks ago and she is here for evaluation and discussion of her scan and recommendation regarding her condition. ? ?MEDICAL HISTORY: ?Past Medical History:  ?Diagnosis Date  ? AAA (abdominal aortic aneurysm) ([place])   ? COPD  (chronic obstructive pulmonary disease) ([place])   ? Depression   ? DVT (deep venous thrombosis) ([place]) 07/12/2019  ? Encounter for antineoplastic chemotherapy 12/02/2018  ? Encounter for antineoplastic immunotherapy 12/02/2018  ? Essential hypertension   ? Goals of care, counseling/discussion 12/02/2018  ? MOST form completed 07/12/19:  DNR/DNI but full scope of usual care. Available in [facility]  ? Headache   ? History of migraine headaches   ? lung ca dx'd 10/2018  ? Sleep apnea   ? Tobacco use disorder   ? ? ?ALLERGIES:  is allergic to codeine sulfate and pantoprazole sodium. ? ?MEDICATIONS:  ?Current Outpatient Medications  ?Medication Sig Dispense Refill  ? acetaminophen (TYLENOL) 500 MG tablet Take 500 mg by mouth every 6 (six) hours as needed for moderate pain.    ? albuterol (VENTOLIN HFA) 108 (90 Base) MCG/ACT inhaler Inhale 2 puffs into the lungs every 6 (six) hours as needed for wheezing or shortness of breath. 4 each 0  ? amLODipine (NORVASC) 10 MG tablet TAKE 1 TABLET EVERY DAY 90 tablet 3  ? atorvastatin (LIPITOR) 20 MG tablet TAKE 1 TABLET EVERY DAY 90 tablet 3  ? famotidine (PEPCID) 20 MG tablet Take 20 mg by mouth daily.     ? FLUoxetine (PROZAC) 20 MG capsule TAKE 1 CAPSULE EVERY DAY (Patient taking differently: Take 20 mg by mouth daily.) 90 capsule 3  ? fluticasone (FLONASE) 50 MCG/ACT nasal spray Place 1 spray into both nostrils daily. 11.1 mL 0  ? furosemide (LASIX) 40 MG tablet Take 1 tablet (40 mg total) by mouth  daily. 30 tablet 1  ? gabapentin (NEURONTIN) 100 MG capsule Take 1 capsule (100 mg total) by mouth 2 (two) times daily. 90 capsule 2  ? lidocaine-prilocaine (EMLA) cream Apply 1 application topically as needed. 30 g 1  ? magnesium oxide (MAG-OX) 400 (240 Mg) MG tablet TAKE 1 TABLET BY MOUTH EVERY DAY 90 tablet 0  ? polyvinyl alcohol (LIQUIFILM TEARS) 1.4 % ophthalmic solution Place 1 drop into both eyes as needed for dry eyes.    ? tiotropium (SPIRIVA HANDIHALER) 18 MCG inhalation capsule Place 1  capsule (18 mcg total) into inhaler and inhale daily. 30 capsule 2  ? ?No current facility-administered medications for this visit.  ? ? ?SURGICAL HISTORY:  ?Past Surgical History:  ?Procedure Laterality Date  ? ABDOMINAL HYSTERECTOMY    ? CHOLECYSTECTOMY    ? IR IMAGING GUIDED PORT INSERTION  02/24/2019  ? ROTATOR CUFF REPAIR  3/04  ? VIDEO BRONCHOSCOPY WITH ENDOBRONCHIAL ULTRASOUND Right 11/25/2018  ? Procedure: VIDEO BRONCHOSCOPY WITH ENDOBRONCHIAL ULTRASOUND;  Surgeon: Collene Gobble, MD;  Location: [facility] OR;  Service: Thoracic;  Laterality: Right;  ? ? ?REVIEW OF SYSTEMS:  A comprehensive review of systems was negative except for: Constitutional: positive for fatigue ?Respiratory: positive for dyspnea on exertion  ? ?PHYSICAL EXAMINATION: General appearance: alert, cooperative, fatigued, and no distress ?Head: Normocephalic, without obvious abnormality, atraumatic ?Neck: no adenopathy, no JVD, supple, symmetrical, trachea midline, and thyroid not enlarged, symmetric, no tenderness/mass/nodules ?Lymph nodes: Cervical, supraclavicular, and axillary nodes normal. ?Resp: clear to auscultation bilaterally ?Back: symmetric, no curvature. ROM normal. No CVA tenderness. ?Cardio: regular rate and rhythm, S1, S2 normal, no murmur, click, rub or gallop ?GI: soft, non-tender; bowel sounds normal; no masses,  no organomegaly ?Extremities: edema 1+ edema ? ?ECOG PERFORMANCE STATUS: 1 - Symptomatic but completely ambulatory ? ?Blood pressure 117/85, pulse 83, temperature (!) 96.8 ?F (36 ?C), temperature source Tympanic, resp. rate 18, weight 157 lb 1 oz (71.2 kg), SpO2 93 %. ? ?LABORATORY DATA: ?Lab Results  ?Component Value Date  ? WBC 3.1 (L) 07/25/2021  ? HGB 12.1 07/25/2021  ? HCT 37.6 07/25/2021  ? MCV 96.9 07/25/2021  ? PLT 160 07/25/2021  ? ? ?  Chemistry   ?   ?Component Value Date/Time  ? NA 139 07/25/2021 1019  ? NA 139 07/21/2017 0958  ? K 3.6 07/25/2021 1019  ? CL 104 07/25/2021 1019  ? CO2 28 07/25/2021 1019  ? BUN 26  (H) 07/25/2021 1019  ? BUN 26 07/21/2017 0958  ? CREATININE 0.98 07/25/2021 1019  ? CREATININE 0.79 09/03/2013 1540  ?    ?Component Value Date/Time  ? CALCIUM 9.3 07/25/2021 1019  ? ALKPHOS 37 (L) 07/25/2021 1019  ? AST 14 (L) 07/25/2021 1019  ? ALT 9 07/25/2021 1019  ? BILITOT 1.1 07/25/2021 1019  ?  ? ? ? ?RADIOGRAPHIC STUDIES: ?CT Chest W Contrast ? ?Result Date: 07/12/2021 ?CLINICAL DATA:  Non-small cell lung cancer staging. EXAM: CT CHEST, ABDOMEN AND PELVIS WITHOUT CONTRAST TECHNIQUE: Multidetector CT imaging of the chest, abdomen and pelvis was performed following the standard protocol without IV contrast. RADIATION DOSE REDUCTION: This exam was performed according to the departmental dose-optimization program which includes automated exposure control, adjustment of the mA and/or kV according to patient size and/or use of iterative reconstruction technique. COMPARISON:  CT abdomen and pelvis 05/09/2021. CT of the chest 03/16/2021. FINDINGS: CT CHEST FINDINGS Cardiovascular: Heart is mildly enlarged. 4.0 cm ascending aortic aneurysm is unchanged. 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 No evidence for dissection. There are atherosclerotic calcifications of the aorta and coronary arteries. No pericardial effusion. Right chest port catheter tip ends in the distal SVC. Mediastinum/Nodes: No enlarged mediastinal, hilar, or axillary lymph nodes. Thyroid gland, trachea, and esophagus demonstrate no significant findings. Lungs/Pleura: There is a small left pleural effusion which is unchanged from 03/16/2021. Moderate emphysematous changes are again seen in the upper lungs. Left lower lobe atelectasis in lingular atelectasis are again seen similar to prior study. Central airways are patent. No new focal lung infiltrate identified. Minimal fluid in the left major fissure is unchanged. Musculoskeletal: Cervical spinal fusion plate is partially visualized. No  focal osseous lesions are seen. No acute fractures. CT ABDOMEN PELVIS FINDINGS Hepatobiliary: Gallbladder surgically absent. There is stable mild intra and extrahepatic biliary ductal dilatation. There

## 2021-08-01 NOTE — Progress Notes (Signed)
Internal Medicine Clinic Attending ? ?Case discussed with Dr. Gilford Rile  At the time of the visit.  We reviewed the resident?s history and exam and pertinent patient test results.  I agree with the assessment, diagnosis, and plan of care documented in the resident?s note.  ?

## 2021-08-02 ENCOUNTER — Ambulatory Visit
Admission: RE | Admit: 2021-08-02 | Discharge: 2021-08-02 | Disposition: A | Discharge status: Home / Self Care | Payer: Medicare HMO | Source: Ambulatory Visit | Attending: Internal Medicine | Admitting: Internal Medicine

## 2021-08-02 ENCOUNTER — Encounter: Payer: Self-pay | Admitting: Internal Medicine

## 2021-08-02 ENCOUNTER — Other Ambulatory Visit: Payer: Self-pay | Admitting: Internal Medicine

## 2021-08-02 DIAGNOSIS — R2232 Localized swelling, mass and lump, left upper limb: Secondary | ICD-10-CM

## 2021-08-02 DIAGNOSIS — N644 Mastodynia: Secondary | ICD-10-CM | POA: Diagnosis not present

## 2021-08-02 LAB — BMP8+ANION GAP
Anion Gap: 14 mmol/L (ref 10.0–18.0)
BUN/Creatinine Ratio: 25 (ref 12–28)
BUN: 22 mg/dL (ref 8–27)
CO2: 28 mmol/L (ref 20–29)
Calcium: 9.4 mg/dL (ref 8.7–10.3)
Chloride: 100 mmol/L (ref 96–106)
Creatinine, Ser: 0.87 mg/dL (ref 0.57–1.00)
Glucose: 113 mg/dL — ABNORMAL HIGH (ref 70–99)
Potassium: 3.7 mmol/L (ref 3.5–5.2)
Sodium: 142 mmol/L (ref 134–144)
eGFR: 70 mL/min/{1.73_m2} (ref >59)

## 2021-08-02 NOTE — Assessment & Plan Note (Signed)
No acute changes in her weight since last visit starting her furosemide 40 mg daily.  Patient states she does urinate a lot, that the urine is clear.  She does feel like she is getting dehydrated and will continue to drink water after urinating.  2+ pitting edema up to the thighs bilaterally in the lower extremities.  Patient denies any orthopnea or dyspnea.  She states that her feet continue feeling they are being stretched.  We discussed compression stockings today, and we will attempt to expedite the process of getting her echocardiogram.  In addition we will increase her furosemide to 40 mg twice daily ?- Patient to obtain compression stockings ?- Increase furosemide to 40 mg twice daily ?- Follow-up in clinic next week ?- We will speak with scheduling to expedite Debra Rodgers's echocardiogram ?

## 2021-08-02 NOTE — Progress Notes (Signed)
? ?  CC: Bilateral lower extremity edema ? ?HPI: ? ?[name] is a 76 y.o. person, with a PMH noted below, who presents to the clinic bilateral lower extremity edema. To see the management of their acute and chronic conditions, please see the A&P note under the Encounters tab.  ? ?Past Medical History:  ?Diagnosis Date  ? AAA (abdominal aortic aneurysm) ([place])   ? COPD (chronic obstructive pulmonary disease) ([place])   ? Depression   ? DVT (deep venous thrombosis) ([place]) 07/12/2019  ? Encounter for antineoplastic chemotherapy 12/02/2018  ? Encounter for antineoplastic immunotherapy 12/02/2018  ? Essential hypertension   ? Goals of care, counseling/discussion 12/02/2018  ? MOST form completed 07/12/19:  DNR/DNI but full scope of usual care. Available in [facility]  ? Headache   ? History of migraine headaches   ? lung ca dx'd 10/2018  ? Sleep apnea   ? Tobacco use disorder   ? ?Review of Systems:   ?Review of Systems  ?Constitutional:  Negative for chills, fever and weight loss.  ?Respiratory:  Negative for cough.   ?Cardiovascular:  Positive for leg swelling. Negative for chest pain, palpitations and orthopnea.  ?Gastrointestinal:  Negative for abdominal pain, diarrhea, nausea and vomiting.  ?Musculoskeletal:  Negative for myalgias and neck pain.  ?Skin:  Negative for itching and rash.  ?Neurological:  Negative for weakness.   ? ?Physical Exam: ? ?Vitals:  ? 08/01/21 1439  ?BP: (!) 110/54  ?Pulse: 84  ?Temp: 98.6 ?F (37 ?C)  ?TempSrc: Oral  ?SpO2: 98%  ?Weight: 157 lb 11.2 oz (71.5 kg)  ?Height: 5' 2"  (1.575 m)  ? ?Physical Exam ?Constitutional:   ?   General: She is not in acute distress. ?   Appearance: Normal appearance. She is not ill-appearing or diaphoretic.  ?Cardiovascular:  ?   Rate and Rhythm: Normal rate and regular rhythm.  ?   Pulses: Normal pulses.  ?   Heart sounds: Normal heart sounds. No murmur heard. ?  No friction rub. No gallop.  ?Pulmonary:  ?   Effort: Pulmonary effort is normal.  ?   Breath sounds:  Normal breath sounds. No wheezing, rhonchi or rales.  ?Abdominal:  ?   General: Abdomen is flat. Bowel sounds are normal.  ?   Palpations: Abdomen is soft.  ?Musculoskeletal:  ?   Right lower leg: Edema present.  ?   Left lower leg: Edema present.  ?Skin: ?   General: Skin is warm and dry.  ?   Findings: No erythema or rash.  ?Neurological:  ?   Mental Status: She is alert and oriented to person, place, and time.  ?Psychiatric:     ?   Mood and Affect: Mood normal.     ?   Behavior: Behavior normal.  ?  ? ?Assessment & Plan:  ? ?See Encounters Tab for problem based charting. ? ?Patient discussed with Dr.  Saverio Danker ? ?

## 2021-08-03 NOTE — Progress Notes (Signed)
Internal Medicine Clinic Attending ? ?Case discussed with Dr. Gilford Rile  At the time of the visit.  We reviewed the resident?s history and exam and pertinent patient test results.  I agree with the assessment, diagnosis, and plan of care documented in the resident?s note.  ?

## 2021-08-06 NOTE — Progress Notes (Signed)
Internal Medicine Clinic Attending ? ?Case discussed with Dr. Gilford Rile  At the time of the visit.  We reviewed the resident?s history and exam and pertinent patient test results.  I agree with the assessment, diagnosis, and plan of care documented in the resident?s note. Marland Kitchen ?

## 2021-08-08 ENCOUNTER — Ambulatory Visit (INDEPENDENT_AMBULATORY_CARE_PROVIDER_SITE_OTHER): Payer: Medicare HMO | Admitting: Internal Medicine

## 2021-08-08 ENCOUNTER — Encounter: Payer: Self-pay | Admitting: Internal Medicine

## 2021-08-08 DIAGNOSIS — R600 Localized edema: Secondary | ICD-10-CM

## 2021-08-08 NOTE — Progress Notes (Signed)
   CC: Bilateral lower extremity edema  HPI:  [name] is a 76 y.o. person, with a PMH noted below, who presents to the clinic bilateral lower extremity edema. To see the management of their acute and chronic conditions, please see the A&P note under the Encounters tab.   Past Medical History:  Diagnosis Date   AAA (abdominal aortic aneurysm) ([facility])    COPD (chronic obstructive pulmonary disease) ([facility])    Depression    DVT (deep venous thrombosis) ([place]) 07/12/2019   Encounter for antineoplastic chemotherapy 12/02/2018   Encounter for antineoplastic immunotherapy 12/02/2018   Essential hypertension    Goals of care, counseling/discussion 12/02/2018   MOST form completed 07/12/19:  DNR/DNI but full scope of usual care. Available in [facility]CA   Headache    History of migraine headaches    lung ca dx'd 10/2018   Sleep apnea    Tobacco use disorder    Review of Systems:   Review of Systems  Constitutional:  Negative for chills, fever and weight loss.  Cardiovascular:  Positive for leg swelling. Negative for chest pain, palpitations, orthopnea and claudication.  Gastrointestinal:  Negative for abdominal pain, nausea and vomiting.    Physical Exam:  Vitals:   08/08/21 1017  BP: 110/63  Pulse: 87  SpO2: 98%  Weight: 154 lb 8 oz (70.1 kg)   Physical Exam Constitutional:      General: She is not in acute distress.    Appearance: Normal appearance. She is not ill-appearing or toxic-appearing.  HENT:     Head: Normocephalic and atraumatic.  Cardiovascular:     Rate and Rhythm: Normal rate and regular rhythm.     Pulses: Normal pulses.     Heart sounds: Normal heart sounds. No murmur heard.   No friction rub. No gallop.  Pulmonary:     Effort: Pulmonary effort is normal.     Breath sounds: Normal breath sounds. No wheezing, rhonchi or rales.  Abdominal:     General: There is no distension.     Tenderness: There is no abdominal tenderness.  Musculoskeletal:     Right lower leg:  Edema present.     Left lower leg: Edema present.     Comments: 2+ pitting edema bilaterally in the lowe extremities to the knees bilaterally  Neurological:     Mental Status: She is alert.  Psychiatric:        Mood and Affect: Mood normal.        Behavior: Behavior normal.     Assessment & Plan:   See Encounters Tab for problem based charting.  Patient seen with Dr. Evette Doffing  Edema of both lower extremities Patient down 3 pound since her last visit.  She states that she feels like there is some slight alleviation in her lower extremities, but they are not back to baseline.  We additionally discussed that her echo is currently being authorized, and they should be reaching out shortly to schedule.  Given that her past 2 BMPs have been stable with potassium to 3.6 and 3.7, she is stable on her current dose of furosemide 2 times daily.  We will have patient follow-up back in 2 weeks for reevaluation. - Continue furosemide 2 times daily - Follow back in 2 weeks - Echo

## 2021-08-08 NOTE — Patient Instructions (Signed)
To Ms. Savino,  ? ?It was a pleasure seeing you today! We are making some progress toward the excess weight, and we are down three pounds. We will have you follow back with [place] in 2 weeks to see how you are doing from a weight stand point. They should call you for your echo this week. Have a good day! ?Maudie Mercury, MD ?

## 2021-08-09 ENCOUNTER — Telehealth: Payer: Self-pay

## 2021-08-09 ENCOUNTER — Other Ambulatory Visit: Payer: Self-pay | Admitting: Student in an Organized Health Care Education/Training Program

## 2021-08-09 MED ORDER — FUROSEMIDE 40 MG PO TABS: 40.0000 mg | ORAL_TABLET | Freq: Two times a day (BID) | ORAL | 1 refills | Status: DC

## 2021-08-09 NOTE — Telephone Encounter (Signed)
furosemide (LASIX) 40 MG tablet   [id number] ,  - [address] [place] [id number] Phone:  [phone] Fax:  [phone]

## 2021-08-09 NOTE — Telephone Encounter (Signed)
Furosemide once a day rx was refilled 07/23/21 per Dr Evette Doffing. I called pt of the above but she stated the pharmacy did not receive a rx for one tab twice a day as instructed by Dr Gilford Rile on 5/10. Stated she had been taking it once a day until saw Dr Gilford Rile. And she stated fluid is decreasing and the pain is less. Pt informed I will ask Dr Gilford Rile to send new rx for  Furosemide BID to the pharmacy.

## 2021-08-09 NOTE — Progress Notes (Signed)
Internal Medicine Clinic Attending  I saw and evaluated the patient.  I personally confirmed the key portions of the history and exam documented by Dr. Winters and I reviewed pertinent patient test results.  The assessment, diagnosis, and plan were formulated together and I agree with the documentation in the resident's note.  

## 2021-08-09 NOTE — Assessment & Plan Note (Signed)
Patient down 3 pound since her last visit.  She states that she feels like there is some slight alleviation in her lower extremities, but they are not back to baseline.  We additionally discussed that her echo is currently being authorized, and they should be reaching out shortly to schedule.  Given that her past 2 BMPs have been stable with potassium to 3.6 and 3.7, she is stable on her current dose of furosemide 2 times daily.  We will have patient follow-up back in 2 weeks for reevaluation. - Continue furosemide 2 times daily - Follow back in 2 weeks - Echo

## 2021-08-09 NOTE — Telephone Encounter (Signed)
Pt called / informed of new rx for Furosemide BID.

## 2021-08-10 ENCOUNTER — Encounter: Payer: Medicare HMO | Admitting: Internal Medicine

## 2021-08-14 ENCOUNTER — Other Ambulatory Visit: Payer: Self-pay | Admitting: Student in an Organized Health Care Education/Training Program

## 2021-08-15 ENCOUNTER — Other Ambulatory Visit: Payer: Self-pay | Admitting: Physician Assistant

## 2021-08-16 ENCOUNTER — Other Ambulatory Visit: Payer: Self-pay | Admitting: Emergency Medicine

## 2021-08-22 ENCOUNTER — Encounter: Payer: Self-pay | Admitting: Internal Medicine

## 2021-08-22 ENCOUNTER — Ambulatory Visit (INDEPENDENT_AMBULATORY_CARE_PROVIDER_SITE_OTHER): Payer: Medicare HMO | Admitting: Internal Medicine

## 2021-08-22 DIAGNOSIS — R600 Localized edema: Secondary | ICD-10-CM | POA: Diagnosis not present

## 2021-08-22 NOTE — Assessment & Plan Note (Signed)
Patient with Class II NYHA symptoms presents with an additional pound of weight loss since our last two visits. She states that she is tolerating her current regimen of furosemide 40 mg BID well. We were able to obtain approval for the ECHO today, and the information was provided to the patient. We will continue to monitor her closely.  - Continue Furosemide 40 mg BID  - Patient to schedule Echo - F/U in 2 weeks

## 2021-08-22 NOTE — Progress Notes (Signed)
   CC: Bilateral Leg Edema  HPI:  [name] is a 76 y.o. person, with a PMH noted below, who presents to the clinic bilateral lower extremity edema. To see the management of their acute and chronic conditions, please see the A&P note under the Encounters tab.   Past Medical History:  Diagnosis Date   AAA (abdominal aortic aneurysm) ([facility])    COPD (chronic obstructive pulmonary disease) ([facility])    Depression    DVT (deep venous thrombosis) ([place]) 07/12/2019   Encounter for antineoplastic chemotherapy 12/02/2018   Encounter for antineoplastic immunotherapy 12/02/2018   Essential hypertension    Goals of care, counseling/discussion 12/02/2018   MOST form completed 07/12/19:  DNR/DNI but full scope of usual care. Available in [facility]   Headache    History of migraine headaches    lung ca dx'd 10/2018   Sleep apnea    Tobacco use disorder    Review of Systems:   Review of Systems  Constitutional:  Negative for chills, diaphoresis, fever, malaise/fatigue and weight loss.  Cardiovascular:  Positive for leg swelling. Negative for palpitations, orthopnea and claudication.    Physical Exam:  Vitals:   08/22/21 1001 08/22/21 1044  BP: 132/70   Pulse: 88   Temp: 98.2 F (36.8 C)   TempSrc: Oral   SpO2: (!) 85% 98%  Weight: 153 lb 6.4 oz (69.6 kg)   Height: 5' 2"  (1.575 m)    Physical Exam Constitutional:      General: She is not in acute distress.    Appearance: Normal appearance. She is not ill-appearing or toxic-appearing.  Cardiovascular:     Rate and Rhythm: Normal rate and regular rhythm.     Pulses: Normal pulses.     Heart sounds: Normal heart sounds. No murmur heard. Musculoskeletal:     Right lower leg: Edema present.     Left lower leg: Edema present.     Comments: Patient wearing compression stocking with 2+ pitting edema at the knee.   Neurological:     Mental Status: She is alert and oriented to person, place, and time.     Assessment & Plan:   See Encounters  Tab for problem based charting.  Patient discussed with Dr. Evette Doffing  No problem-specific Assessment & Plan notes found for this encounter.

## 2021-08-22 NOTE — Patient Instructions (Signed)
To Debra Rodgers,   It was a pleasure seeing you again! Today we discussed your leg edema and trying to schedule your echo.   For your leg edema, we are down about one pound since you last saw [place], please continue to take your lasix 40 mg twice a day. I am also glad that we were able to schedule the echo, and were able to get you the information. We will continue to see you in 2 weeks.   I hope you have a good day,  Maudie Mercury, MD

## 2021-08-23 NOTE — Progress Notes (Signed)
Internal Medicine Clinic Attending  Case discussed with Dr. Gilford Rile  At the time of the visit.  We reviewed the resident's history and exam and pertinent patient test results.  I agree with the assessment, diagnosis, and plan of care documented in the resident's note.

## 2021-08-29 ENCOUNTER — Telehealth: Payer: Self-pay | Admitting: *Deleted

## 2021-08-29 NOTE — Telephone Encounter (Signed)
This nurse completed FMLA paperwork today.  Placed in basket designated for collaborative pick-up.  Awaiting provider review and signature to complete process.

## 2021-09-01 ENCOUNTER — Other Ambulatory Visit: Payer: Self-pay | Admitting: Internal Medicine

## 2021-09-03 NOTE — Telephone Encounter (Signed)
Message left for status check of FMLA form for spouse.

## 2021-09-04 ENCOUNTER — Ambulatory Visit (HOSPITAL_COMMUNITY)
Admission: RE | Admit: 2021-09-04 | Discharge: 2021-09-04 | Disposition: A | Discharge status: Home / Self Care | Payer: Medicare HMO | Source: Ambulatory Visit | Attending: Internal Medicine | Admitting: Internal Medicine

## 2021-09-04 DIAGNOSIS — E785 Hyperlipidemia, unspecified: Secondary | ICD-10-CM | POA: Insufficient documentation

## 2021-09-04 DIAGNOSIS — R600 Localized edema: Secondary | ICD-10-CM | POA: Insufficient documentation

## 2021-09-04 DIAGNOSIS — R011 Cardiac murmur, unspecified: Secondary | ICD-10-CM | POA: Diagnosis not present

## 2021-09-04 DIAGNOSIS — G4730 Sleep apnea, unspecified: Secondary | ICD-10-CM | POA: Diagnosis not present

## 2021-09-04 DIAGNOSIS — I3481 Nonrheumatic mitral (valve) annulus calcification: Secondary | ICD-10-CM | POA: Insufficient documentation

## 2021-09-04 DIAGNOSIS — I119 Hypertensive heart disease without heart failure: Secondary | ICD-10-CM | POA: Diagnosis not present

## 2021-09-04 DIAGNOSIS — I3139 Other pericardial effusion (noninflammatory): Secondary | ICD-10-CM | POA: Diagnosis not present

## 2021-09-04 DIAGNOSIS — J449 Chronic obstructive pulmonary disease, unspecified: Secondary | ICD-10-CM | POA: Diagnosis not present

## 2021-09-04 LAB — ECHOCARDIOGRAM COMPLETE
Area-P 1/2: 3.72 cm2
S' Lateral: 2.8 cm

## 2021-09-04 NOTE — Telephone Encounter (Signed)
Connected with Rudene Anda [phone]).  Requested signed [place] for Use / Disclosure for FMLA for spouse.  Received completed form today however authorization required.  "Does it matter what time?  I have an appointment today at 2:00 pm."    Advised this nurse leaving form at entry receptionist area which is staffed through 4:30 pm. Currently no further questions or needs.

## 2021-09-04 NOTE — Telephone Encounter (Signed)
FMLA paperwork successfully faxed to [facility] fax no.# [phone] at this time.  Original copy mailed to patient address on file as discussed today with Rudene Anda in lobby. [place] [phone]  No further completed form delivery actions performed or further instructions received.

## 2021-09-10 ENCOUNTER — Encounter: Payer: Self-pay | Admitting: Student in an Organized Health Care Education/Training Program

## 2021-09-10 ENCOUNTER — Ambulatory Visit (INDEPENDENT_AMBULATORY_CARE_PROVIDER_SITE_OTHER): Payer: Medicare HMO | Admitting: Student in an Organized Health Care Education/Training Program

## 2021-09-10 VITALS — BP 136/80 | HR 77 | Temp 97.9°F | Ht 62.0 in | Wt 146.8 lb

## 2021-09-10 DIAGNOSIS — F330 Major depressive disorder, recurrent, mild: Secondary | ICD-10-CM | POA: Diagnosis not present

## 2021-09-10 DIAGNOSIS — Z87891 Personal history of nicotine dependence: Secondary | ICD-10-CM

## 2021-09-10 DIAGNOSIS — I10 Essential (primary) hypertension: Secondary | ICD-10-CM | POA: Diagnosis not present

## 2021-09-10 DIAGNOSIS — R600 Localized edema: Secondary | ICD-10-CM

## 2021-09-10 MED ORDER — FLUOXETINE HCL 20 MG PO CAPS: 20.0000 mg | ORAL_CAPSULE | Freq: Every day | ORAL | 3 refills | Status: DC

## 2021-09-10 NOTE — Progress Notes (Signed)
   Established Patient Office Visit  Subjective   Patient ID: Debra Rodgers, female    DOB: 1945-07-31  Age: 76 y.o. MRN: [id number]   HPI  76 year old person living with advanced stage lung cancer here for follow up of lower extremity edema. She has been struggling with this edema for several months. Workup has been reassuring with no signs of heart failure or DVT. We think the edema is from venous insufficiency and sedentary condition. She is wearing compression stocking this morning which is great to see. She is doing well at home, her husband helps her during the day. She has some bad days where she is very tired and spends most of the day sleeping. She is eating most meals well, no nausea or vomiting, has some abdominal fullness and early satiety. No diarrhea or fevers.     Objective:     BP 136/80   Wt 146 lb 12.8 oz (66.6 kg)   SpO2 96%   BMI 26.85 kg/m    Physical Exam  Gen: frail appearing woman, in a wheelchair Neck: no lymphadenopathy Lung: clear throughout, no crackles Heart: RRR, soft early systolic murmur at the RUSB Abd: soft, there is hepatomegaly to about 4cm below the costal margin, no splenomegaly Ext: Warm, 1+ pitting edema bilaterally    Assessment & Plan:   Problem List Items Addressed This Visit       High   Essential hypertension - Primary (Chronic)    Blood pressure well controlled today, this is a minor issue given her advanced cancer. Will continue with amlodipine 42m daily with target BP generally under 140/90. Will want to avoid hypotension given her frailty and risk for falls.      Major depressive disorder, recurrent episode (HCC) (Chronic)    Stable mood over this year, going through a lot with her cancer treatments. She enjoys a nice quality of life. We set a goal for her to go fishing with her husband. Will continue with fluoxetine 277mdaily.       Relevant Medications   FLUoxetine (PROZAC) 20 MG capsule     Unprioritized   Edema  of both lower extremities    Improving nicely with supportive care treatment. No signs of CHF on exam today. Plan to continue with compression stockings during the day, elevation, and ambulation consistently. We are also using adjuvant lasix [id number] which is working well, no signs of hypovolemia and renal function has been stable.        Return in about 3 months (around 12/11/2021).    DunAxel FillerD

## 2021-09-10 NOTE — Assessment & Plan Note (Signed)
Stable mood over this year, going through a lot with her cancer treatments. She enjoys a nice quality of life. We set a goal for her to go fishing with her husband. Will continue with fluoxetine 101m daily.

## 2021-09-10 NOTE — Assessment & Plan Note (Signed)
Improving nicely with supportive care treatment. No signs of CHF on exam today. Plan to continue with compression stockings during the day, elevation, and ambulation consistently. We are also using adjuvant lasix 1m bid which is working well, no signs of hypovolemia and renal function has been stable.

## 2021-09-10 NOTE — Assessment & Plan Note (Signed)
Blood pressure well controlled today, this is a minor issue given her advanced cancer. Will continue with amlodipine 63m daily with target BP generally under 140/90. Will want to avoid hypotension given her frailty and risk for falls.

## 2021-10-02 ENCOUNTER — Other Ambulatory Visit: Payer: Medicare HMO

## 2021-10-04 ENCOUNTER — Ambulatory Visit: Payer: Medicare HMO | Admitting: Internal Medicine

## 2021-10-05 ENCOUNTER — Ambulatory Visit (HOSPITAL_COMMUNITY)
Admission: RE | Admit: 2021-10-05 | Discharge: 2021-10-05 | Disposition: A | Discharge status: Home / Self Care | Payer: Medicare HMO | Source: Ambulatory Visit | Attending: Internal Medicine | Admitting: Internal Medicine

## 2021-10-05 DIAGNOSIS — K5730 Diverticulosis of large intestine without perforation or abscess without bleeding: Secondary | ICD-10-CM | POA: Diagnosis not present

## 2021-10-05 DIAGNOSIS — J439 Emphysema, unspecified: Secondary | ICD-10-CM | POA: Diagnosis not present

## 2021-10-05 DIAGNOSIS — J90 Pleural effusion, not elsewhere classified: Secondary | ICD-10-CM | POA: Diagnosis not present

## 2021-10-05 DIAGNOSIS — C3490 Malignant neoplasm of unspecified part of unspecified bronchus or lung: Secondary | ICD-10-CM | POA: Insufficient documentation

## 2021-10-05 DIAGNOSIS — J9811 Atelectasis: Secondary | ICD-10-CM | POA: Diagnosis not present

## 2021-10-05 DIAGNOSIS — K7689 Other specified diseases of liver: Secondary | ICD-10-CM | POA: Diagnosis not present

## 2021-10-05 MED ORDER — IOHEXOL 9 MG/ML PO SOLN
1000.0000 mL | ORAL | Status: AC
  Administered 2021-10-05: 1000 mL via ORAL

## 2021-10-05 MED ORDER — IOHEXOL 300 MG/ML  SOLN
100.0000 mL | Freq: Once | INTRAMUSCULAR | Status: AC | PRN
  Administered 2021-10-05: 100 mL via INTRAVENOUS

## 2021-10-05 MED ORDER — SODIUM CHLORIDE (PF) 0.9 % IJ SOLN
INTRAMUSCULAR | Status: AC
  Filled 2021-10-05: qty 50

## 2021-10-05 MED ORDER — IOHEXOL 9 MG/ML PO SOLN
ORAL | Status: AC
  Filled 2021-10-05: qty 1000

## 2021-10-10 ENCOUNTER — Other Ambulatory Visit: Payer: Self-pay

## 2021-10-10 ENCOUNTER — Inpatient Hospital Stay: Payer: Medicare HMO | Admitting: Internal Medicine

## 2021-10-10 ENCOUNTER — Inpatient Hospital Stay: Payer: Medicare HMO | Attending: Internal Medicine

## 2021-10-10 VITALS — BP 126/82 | HR 67 | Temp 97.5°F | Resp 16 | Wt 140.4 lb

## 2021-10-10 DIAGNOSIS — K5730 Diverticulosis of large intestine without perforation or abscess without bleeding: Secondary | ICD-10-CM | POA: Insufficient documentation

## 2021-10-10 DIAGNOSIS — C3490 Malignant neoplasm of unspecified part of unspecified bronchus or lung: Secondary | ICD-10-CM | POA: Diagnosis not present

## 2021-10-10 DIAGNOSIS — I2510 Atherosclerotic heart disease of native coronary artery without angina pectoris: Secondary | ICD-10-CM | POA: Diagnosis not present

## 2021-10-10 DIAGNOSIS — Z86718 Personal history of other venous thrombosis and embolism: Secondary | ICD-10-CM | POA: Diagnosis not present

## 2021-10-10 DIAGNOSIS — I714 Abdominal aortic aneurysm, without rupture, unspecified: Secondary | ICD-10-CM | POA: Insufficient documentation

## 2021-10-10 DIAGNOSIS — R609 Edema, unspecified: Secondary | ICD-10-CM | POA: Insufficient documentation

## 2021-10-10 DIAGNOSIS — I7123 Aneurysm of the descending thoracic aorta, without rupture: Secondary | ICD-10-CM | POA: Insufficient documentation

## 2021-10-10 DIAGNOSIS — G4730 Sleep apnea, unspecified: Secondary | ICD-10-CM | POA: Diagnosis not present

## 2021-10-10 DIAGNOSIS — I119 Hypertensive heart disease without heart failure: Secondary | ICD-10-CM | POA: Diagnosis not present

## 2021-10-10 DIAGNOSIS — C3411 Malignant neoplasm of upper lobe, right bronchus or lung: Secondary | ICD-10-CM | POA: Diagnosis not present

## 2021-10-10 DIAGNOSIS — Z79899 Other long term (current) drug therapy: Secondary | ICD-10-CM | POA: Diagnosis not present

## 2021-10-10 DIAGNOSIS — J439 Emphysema, unspecified: Secondary | ICD-10-CM | POA: Insufficient documentation

## 2021-10-10 DIAGNOSIS — C3491 Malignant neoplasm of unspecified part of right bronchus or lung: Secondary | ICD-10-CM

## 2021-10-10 DIAGNOSIS — C782 Secondary malignant neoplasm of pleura: Secondary | ICD-10-CM | POA: Diagnosis not present

## 2021-10-10 DIAGNOSIS — Z87891 Personal history of nicotine dependence: Secondary | ICD-10-CM | POA: Insufficient documentation

## 2021-10-10 DIAGNOSIS — J449 Chronic obstructive pulmonary disease, unspecified: Secondary | ICD-10-CM | POA: Insufficient documentation

## 2021-10-10 LAB — CMP (CANCER CENTER ONLY)
ALT: 6 U/L (ref 0–44)
AST: 12 U/L — ABNORMAL LOW (ref 15–41)
Albumin: 3.9 g/dL (ref 3.5–5.0)
Alkaline Phosphatase: 70 U/L (ref 38–126)
Anion gap: 5 (ref 5–15)
BUN: 25 mg/dL — ABNORMAL HIGH (ref 8–23)
CO2: 35 mmol/L — ABNORMAL HIGH (ref 22–32)
Calcium: 9.6 mg/dL (ref 8.9–10.3)
Chloride: 99 mmol/L (ref 98–111)
Creatinine: 0.97 mg/dL (ref 0.44–1.00)
GFR, Estimated: >60 mL/min
Glucose, Bld: 91 mg/dL (ref 70–99)
Potassium: 3.4 mmol/L — ABNORMAL LOW (ref 3.5–5.1)
Sodium: 139 mmol/L (ref 135–145)
Total Bilirubin: 1 mg/dL (ref 0.3–1.2)
Total Protein: 6.8 g/dL (ref 6.5–8.1)

## 2021-10-10 LAB — CBC WITH DIFFERENTIAL (CANCER CENTER ONLY)
Abs Immature Granulocytes: 0 10*3/uL (ref 0.00–0.07)
Basophils Absolute: 0 10*3/uL (ref 0.0–0.1)
Basophils Relative: 1 %
Eosinophils Absolute: 0.1 10*3/uL (ref 0.0–0.5)
Eosinophils Relative: 4 %
HCT: 35.1 % — ABNORMAL LOW (ref 36.0–46.0)
Hemoglobin: 11.9 g/dL — ABNORMAL LOW (ref 12.0–15.0)
Immature Granulocytes: 0 %
Lymphocytes Relative: 42 %
Lymphs Abs: 1.4 10*3/uL (ref 0.7–4.0)
MCH: 31.6 pg (ref 26.0–34.0)
MCHC: 33.9 g/dL (ref 30.0–36.0)
MCV: 93.1 fL (ref 80.0–100.0)
Monocytes Absolute: 0.3 10*3/uL (ref 0.1–1.0)
Monocytes Relative: 9 %
Neutro Abs: 1.5 10*3/uL — ABNORMAL LOW (ref 1.7–7.7)
Neutrophils Relative %: 44 %
Platelet Count: 173 10*3/uL (ref 150–400)
RBC: 3.77 MIL/uL — ABNORMAL LOW (ref 3.87–5.11)
RDW: 16.1 % — ABNORMAL HIGH (ref 11.5–15.5)
WBC Count: 3.4 10*3/uL — ABNORMAL LOW (ref 4.0–10.5)
nRBC: 0 % (ref 0.0–0.2)

## 2021-10-10 LAB — TOTAL PROTEIN, URINE DIPSTICK: Protein, ur: NEGATIVE mg/dL

## 2021-10-10 NOTE — Progress Notes (Signed)
[facility] [phone]   Fax:([phone]) [phone]  OFFICE PROGRESS NOTE  [name], MD [place] [address]  DIAGNOSIS: Stage IV non-small cell lung cancer, squamous cell carcinoma. She presented with right upper lobe lung mass in addition to pleural-based metastasis and mediastinal lymphadenopathy. She was diagnosed in September 2020.    PRIOR THERAPY: Chemotherapy with carboplatin for an AUC of 5, paclitaxel 175 mg/m, and Keytruda 200 mg IV every 3 weeks with Neulasta support. Last dose 12/08/19. Status post 18 cycles.  Starting from cycle #5 was on maintenance treatment with single agent Keytruda every 3 weeks. This was discontinued due to evidence of disease progression.    CURRENT THERAPY: Palliative systemic chemotherapy with docetaxel 75 mg/m2 and Cyramza 10 mg/kg IV every 3 weeks with Neulasta support. First dose on 01/04/20.  Status post 24 cycles.  Starting from cycle #10 docetaxel was reduced to 65 Mg/M2.  Her treatment is currently on hold since March 2023 secondary to intolerance and pancolitis  INTERVAL HISTORY: Debra Rodgers 76 y.o. female returns to the clinic today for follow-up visit accompanied by her husband.  The patient is feeling fine today with no concerning complaints.  She continues to have mild cough.  She denied having any current chest pain, shortness of breath or hemoptysis.  She has no nausea, vomiting, diarrhea or constipation.  She has no headache or visual changes.  She is here today for evaluation with repeat CT scan of the chest, abdomen and pelvis for restaging of her disease.  MEDICAL HISTORY: Past Medical History:  Diagnosis Date   AAA (abdominal aortic aneurysm) (HCC)    COPD (chronic obstructive pulmonary disease) ([place])    Depression    DVT (deep venous thrombosis) ([place]) 07/12/2019   Encounter for antineoplastic chemotherapy 12/02/2018   Encounter for antineoplastic immunotherapy 12/02/2018    Essential hypertension    Goals of care, counseling/discussion 12/02/2018   MOST form completed 07/12/19:  DNR/DNI but full scope of usual care. Available in [facility]   Headache    History of migraine headaches    lung ca dx'd 10/2018   Sleep apnea    Tobacco use disorder     ALLERGIES:  is allergic to codeine sulfate and pantoprazole sodium.  MEDICATIONS:  Current Outpatient Medications  Medication Sig Dispense Refill   acetaminophen (TYLENOL) 500 MG tablet Take 500 mg by mouth every 6 (six) hours as needed for moderate pain.     albuterol (VENTOLIN HFA) 108 (90 Base) MCG/ACT inhaler TAKE 2 PUFFS BY MOUTH EVERY 6 HOURS AS NEEDED FOR WHEEZE OR SHORTNESS OF BREATH 26.8 each 2   amLODipine (NORVASC) 10 MG tablet TAKE 1 TABLET EVERY DAY 90 tablet 3   atorvastatin (LIPITOR) 20 MG tablet TAKE 1 TABLET EVERY DAY 90 tablet 3   famotidine (PEPCID) 20 MG tablet Take 20 mg by mouth daily.      FLUoxetine (PROZAC) 20 MG capsule Take 1 capsule (20 mg total) by mouth daily. 90 capsule 3   fluticasone (FLONASE) 50 MCG/ACT nasal spray Place 1 spray into both nostrils daily. 11.1 mL 0   furosemide (LASIX) 40 MG tablet TAKE 1 TABLET BY MOUTH TWICE A DAY 60 tablet 1   gabapentin (NEURONTIN) 100 MG capsule Take 1 capsule (100 mg total) by mouth 2 (two) times daily. 90 capsule 2   lidocaine-prilocaine (EMLA) cream Apply 1 application topically as needed. 30 g 1   magnesium oxide (  MAG-OX) 400 (240 Mg) MG tablet TAKE 1 TABLET BY MOUTH EVERY DAY 90 tablet 0   polyvinyl alcohol (LIQUIFILM TEARS) 1.4 % ophthalmic solution Place 1 drop into both eyes as needed for dry eyes.     tiotropium (SPIRIVA HANDIHALER) 18 MCG inhalation capsule Place 1 capsule (18 mcg total) into inhaler and inhale daily. 30 capsule 2   No current facility-administered medications for this visit.    SURGICAL HISTORY:  Past Surgical History:  Procedure Laterality Date   ABDOMINAL HYSTERECTOMY     CHOLECYSTECTOMY     IR IMAGING GUIDED  PORT INSERTION  02/24/2019   ROTATOR CUFF REPAIR  3/04   VIDEO BRONCHOSCOPY WITH ENDOBRONCHIAL ULTRASOUND Right 11/25/2018   Procedure: VIDEO BRONCHOSCOPY WITH ENDOBRONCHIAL ULTRASOUND;  Surgeon: Collene Gobble, MD;  Location: [facility] OR;  Service: Thoracic;  Laterality: Right;    REVIEW OF SYSTEMS:  A comprehensive review of systems was negative except for: Constitutional: positive for fatigue Respiratory: positive for cough   PHYSICAL EXAMINATION: General appearance: alert, cooperative, fatigued, and no distress Head: Normocephalic, without obvious abnormality, atraumatic Neck: no adenopathy, no JVD, supple, symmetrical, trachea midline, and thyroid not enlarged, symmetric, no tenderness/mass/nodules Lymph nodes: Cervical, supraclavicular, and axillary nodes normal. Resp: clear to auscultation bilaterally Back: symmetric, no curvature. ROM normal. No CVA tenderness. Cardio: regular rate and rhythm, S1, S2 normal, no murmur, click, rub or gallop GI: soft, non-tender; bowel sounds normal; no masses,  no organomegaly Extremities: edema 1+ edema  ECOG PERFORMANCE STATUS: 1 - Symptomatic but completely ambulatory  Blood pressure 126/82, pulse 67, temperature (!) 97.5 F (36.4 C), temperature source Temporal, resp. rate 16, weight 140 lb 7 oz (63.7 kg), SpO2 100 %.  LABORATORY DATA: Lab Results  Component Value Date   WBC 3.4 (L) 10/10/2021   HGB 11.9 (L) 10/10/2021   HCT 35.1 (L) 10/10/2021   MCV 93.1 10/10/2021   PLT 173 10/10/2021      Chemistry      Component Value Date/Time   NA 139 10/10/2021 0911   NA 142 08/01/2021 1523   K 3.4 (L) 10/10/2021 0911   CL 99 10/10/2021 0911   CO2 35 (H) 10/10/2021 0911   BUN 25 (H) 10/10/2021 0911   BUN 22 08/01/2021 1523   CREATININE 0.97 10/10/2021 0911   CREATININE 0.79 09/03/2013 1540      Component Value Date/Time   CALCIUM 9.6 10/10/2021 0911   ALKPHOS 70 10/10/2021 0911   AST 12 (L) 10/10/2021 0911   ALT 6 10/10/2021 0911    BILITOT 1.0 10/10/2021 0911       RADIOGRAPHIC STUDIES: CT Chest W Contrast  Result Date: 10/08/2021 CLINICAL DATA:  Metastatic non-small cell lung cancer restaging. Chemotherapy completed March 2023. * Tracking Code: BO * EXAM: CT CHEST, ABDOMEN, AND PELVIS WITH CONTRAST TECHNIQUE: Multidetector CT imaging of the chest, abdomen and pelvis was performed following the standard protocol during bolus administration of intravenous contrast. RADIATION DOSE REDUCTION: This exam was performed according to the departmental dose-optimization program which includes automated exposure control, adjustment of the mA and/or kV according to patient size and/or use of iterative reconstruction technique. CONTRAST:  145m OMNIPAQUE IOHEXOL 300 MG/ML  SOLN COMPARISON:  Multiple exams, including 07/12/2021 FINDINGS: CT CHEST FINDINGS Cardiovascular: Left Port-A-Cath tip: SVC. Coronary, aortic arch, and branch vessel atherosclerotic vascular disease. Moderate cardiomegaly. 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 Prominent main pulmonary artery, probably from pulmonary arterial hypertension. Mediastinum/Nodes: Clustered right lower paratracheal  nodes up to 0.9 cm in short axis, upper normal size. Faint stranding in the mediastinum and subcutaneous tissues potentially from mild third spacing of fluid. Lungs/Pleura: Small left pleural effusion, improved from previous exam. Severe emphysema. Airway thickening is present, suggesting bronchitis or reactive airways disease. Mild atelectasis in the lingula and left lower lobe along the left hemidiaphragm. Musculoskeletal: Thoracic spondylosis. S-shaped thoracolumbar scoliosis with levoconvex upper portion dextroconvex lower thoracic portion. CT ABDOMEN PELVIS FINDINGS Hepatobiliary: Chronically stable 4 mm hypodense lesion posteriorly in the lateral segment left hepatic lobe on image 48 series 2, no change from 03/15/2020, highly likely to be benign.  Small cyst in the right hepatic lobe on image 55 series 2, stable and benign. Similar small hypodense lesions in segment 4 of the liver and in the right hepatic lobe on image 66 series 2, chronically stable considered benign. The gallbladder is absent. Pancreas: Unremarkable Spleen: Unremarkable Adrenals/Urinary Tract: Unremarkable Stomach/Bowel: Sigmoid colon diverticulosis. Vascular/Lymphatic: Partially thrombosed abdominal aortic aneurysm, 4.7 cm transverse in the suprarenal segment, and tapering somewhat below the level of the renal arteries. Atherosclerosis is present, including aortoiliac atherosclerotic disease. Patent proximal celiac trunk and SMA. No pathologic adenopathy observed. Reproductive: 7.2 by 6.8 by 7.3 cm (volume = 190 cm^3) cystic lesion associated with the left adnexa/left ovary. This is mildly enlarged over time, previously 6.0 by 5.6 by 6.2 cm (volume = 110 cm^3) on 01/10/2017. Other: Diffuse subcutaneous edema favoring third spacing of fluid. No overt ascites. Musculoskeletal: Mild chronic sclerosis in the left inferior pubic bone medially. Grade 1 degenerative anterolisthesis at L4-5 with lower lumbar spondylosis and degenerative disc disease resulting in multilevel impingement. IMPRESSION: 1. No specific indicators of recurrent/active lung malignancy. 2. Small left pleural effusion, improved from the prior exam. 3. Cardiomegaly with diffuse subcutaneous edema and also mesenteric edema probably from third spacing of fluid. No ascites. Prominent main pulmonary artery, likely from pulmonary arterial hypertension. 4. Persistent 190 cc cystic lesion associated with the left adnexa/left ovary. This has mildly enlarged over the last few years. Options include continued surveillance in the context of the patient's oncology imaging, versus further workup with pelvic sonography. 5. Descending thoracic aortic aneurysm, currently 4.5 cm in diameter. Partially thrombosed mostly suprarenal abdominal  aortic aneurysm, 4.7 cm in transverse dimension. These can potentially be followed in the context of the patient's follow up oncology imaging. Otherwise, I recommend follow-up CT/MR every 6 months and vascular consultation. This recommendation follows ACR consensus guidelines: White Paper of the ACR Incidental Findings Committee II on Vascular Findings. J Am Coll Radiol 2013; 10:789-794. Other imaging findings of potential clinical significance: Aortic Atherosclerosis (ICD10-I70.0). Emphysema (ICD10-J43.9). Coronary atherosclerosis. Mild atelectasis at the left lung base. Airway thickening is present, suggesting bronchitis or reactive airways disease. Thoracolumbar scoliosis. Sigmoid colon diverticulosis. Multilevel lumbar impingement. Electronically Signed   By: Van Clines M.D.   On: 10/08/2021 07:59   CT Abdomen Pelvis W Contrast  Result Date: 10/08/2021 CLINICAL DATA:  Metastatic non-small cell lung cancer restaging. Chemotherapy completed March 2023. * Tracking Code: BO * EXAM: CT CHEST, ABDOMEN, AND PELVIS WITH CONTRAST TECHNIQUE: Multidetector CT imaging of the chest, abdomen and pelvis was performed following the standard protocol during bolus administration of intravenous contrast. RADIATION DOSE REDUCTION: This exam was performed according to the departmental dose-optimization program which includes automated exposure control, adjustment of the mA and/or kV according to patient size and/or use of iterative reconstruction technique. CONTRAST:  1101m OMNIPAQUE IOHEXOL 300 MG/ML  SOLN COMPARISON:  Multiple exams, including  07/12/2021 FINDINGS: CT CHEST FINDINGS Cardiovascular: Left Port-A-Cath tip: SVC. Coronary, aortic arch, and branch vessel atherosclerotic vascular disease. Moderate cardiomegaly. 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 Prominent main pulmonary artery, probably from pulmonary arterial hypertension. Mediastinum/Nodes: Clustered right lower  paratracheal nodes up to 0.9 cm in short axis, upper normal size. Faint stranding in the mediastinum and subcutaneous tissues potentially from mild third spacing of fluid. Lungs/Pleura: Small left pleural effusion, improved from previous exam. Severe emphysema. Airway thickening is present, suggesting bronchitis or reactive airways disease. Mild atelectasis in the lingula and left lower lobe along the left hemidiaphragm. Musculoskeletal: Thoracic spondylosis. S-shaped thoracolumbar scoliosis with levoconvex upper portion dextroconvex lower thoracic portion. CT ABDOMEN PELVIS FINDINGS Hepatobiliary: Chronically stable 4 mm hypodense lesion posteriorly in the lateral segment left hepatic lobe on image 48 series 2, no change from 03/15/2020, highly likely to be benign. Small cyst in the right hepatic lobe on image 55 series 2, stable and benign. Similar small hypodense lesions in segment 4 of the liver and in the right hepatic lobe on image 66 series 2, chronically stable considered benign. The gallbladder is absent. Pancreas: Unremarkable Spleen: Unremarkable Adrenals/Urinary Tract: Unremarkable Stomach/Bowel: Sigmoid colon diverticulosis. Vascular/Lymphatic: Partially thrombosed abdominal aortic aneurysm, 4.7 cm transverse in the suprarenal segment, and tapering somewhat below the level of the renal arteries. Atherosclerosis is present, including aortoiliac atherosclerotic disease. Patent proximal celiac trunk and SMA. No pathologic adenopathy observed. Reproductive: 7.2 by 6.8 by 7.3 cm (volume = 190 cm^3) cystic lesion associated with the left adnexa/left ovary. This is mildly enlarged over time, previously 6.0 by 5.6 by 6.2 cm (volume = 110 cm^3) on 01/10/2017. Other: Diffuse subcutaneous edema favoring third spacing of fluid. No overt ascites. Musculoskeletal: Mild chronic sclerosis in the left inferior pubic bone medially. Grade 1 degenerative anterolisthesis at L4-5 with lower lumbar spondylosis and  degenerative disc disease resulting in multilevel impingement. IMPRESSION: 1. No specific indicators of recurrent/active lung malignancy. 2. Small left pleural effusion, improved from the prior exam. 3. Cardiomegaly with diffuse subcutaneous edema and also mesenteric edema probably from third spacing of fluid. No ascites. Prominent main pulmonary artery, likely from pulmonary arterial hypertension. 4. Persistent 190 cc cystic lesion associated with the left adnexa/left ovary. This has mildly enlarged over the last few years. Options include continued surveillance in the context of the patient's oncology imaging, versus further workup with pelvic sonography. 5. Descending thoracic aortic aneurysm, currently 4.5 cm in diameter. Partially thrombosed mostly suprarenal abdominal aortic aneurysm, 4.7 cm in transverse dimension. These can potentially be followed in the context of the patient's follow up oncology imaging. Otherwise, I recommend follow-up CT/MR every 6 months and vascular consultation. This recommendation follows ACR consensus guidelines: White Paper of the ACR Incidental Findings Committee II on Vascular Findings. J Am Coll Radiol 2013; 10:789-794. Other imaging findings of potential clinical significance: Aortic Atherosclerosis (ICD10-I70.0). Emphysema (ICD10-J43.9). Coronary atherosclerosis. Mild atelectasis at the left lung base. Airway thickening is present, suggesting bronchitis or reactive airways disease. Thoracolumbar scoliosis. Sigmoid colon diverticulosis. Multilevel lumbar impingement. Electronically Signed   By: Van Clines M.D.   On: 10/08/2021 07:59    ASSESSMENT AND PLAN: This is a very pleasant 76 years old African-American female with stage IV non-small cell carcinoma,, squamous cell carcinoma diagnosed in September 2020.  She presented with extensive right-sided pleural and thoracic nodal hypermetabolic disease with no extrathoracic disease. The patient started induction  treatment with systemic chemotherapy with carboplatin, paclitaxel and Keytruda  status post 4 cycles with partial response after cycle #4.  This was followed by 14 cycles of maintenance treatment with single agent Keytruda discontinued secondary to disease progression. The patient is currently undergoing second line systemic chemotherapy with docetaxel 75 mg/M2 and Cyramza 10 mg/KG every 3 weeks with Neulasta support.  Status post 24 cycles.  Starting from cycle #10 her dose of docetaxel was reduced to 65 Mg/M2. Her treatment has been on hold for the last 5 months secondary to pancolitis seen on previous imaging studies in February 2023. The patient has been doing fine on observation with no concerning complaints. She had repeat CT scan of the chest, abdomen and pelvis performed recently.  I personally and independently reviewed the scans and discussed the result with the patient and her husband. Her scan showed no concerning findings for disease progression. I recommended for her to continue on observation with repeat CT scan of the chest, abdomen and pelvis in 3 months. Regarding the left adnexal/left ovary cyst, we will continue to monitor closely on the upcoming imaging studies. The patient was advised to call immediately if she has any other concerning symptoms in the interval. The patient voices understanding of current disease status and treatment options and is in agreement with the current care plan. All questions were answered. The patient knows to call the clinic with any problems, questions or concerns. We can certainly see the patient much sooner if necessary.   Disclaimer: This note was dictated with voice recognition software. Similar sounding words can inadvertently be transcribed and may not be corrected upon review.

## 2021-10-15 ENCOUNTER — Other Ambulatory Visit: Payer: Self-pay

## 2021-10-18 ENCOUNTER — Telehealth: Payer: Self-pay | Admitting: Internal Medicine

## 2021-10-18 NOTE — Telephone Encounter (Signed)
Scheduled per 07/19 los, patient has been called and notified.

## 2021-10-23 ENCOUNTER — Other Ambulatory Visit: Payer: Self-pay

## 2021-10-26 NOTE — Progress Notes (Signed)
Addendum:  I was notified that the patient did not have the labs drawn that were ordered after this visit. She has had several subsequent visits and the issue with volume overload is resolving. I will cancel the orders.

## 2021-10-26 NOTE — Addendum Note (Signed)
Addended by: Lalla Brothers T on: 10/26/2021 09:23 AM   Modules accepted: Orders

## 2021-10-29 ENCOUNTER — Other Ambulatory Visit: Payer: Self-pay | Admitting: Student in an Organized Health Care Education/Training Program

## 2021-10-29 DIAGNOSIS — I10 Essential (primary) hypertension: Secondary | ICD-10-CM

## 2021-11-02 ENCOUNTER — Ambulatory Visit
Admission: RE | Admit: 2021-11-02 | Discharge: 2021-11-02 | Disposition: A | Discharge status: Home / Self Care | Payer: Medicare HMO | Source: Ambulatory Visit | Attending: Internal Medicine | Admitting: Internal Medicine

## 2021-11-02 DIAGNOSIS — R590 Localized enlarged lymph nodes: Secondary | ICD-10-CM | POA: Diagnosis not present

## 2021-11-02 DIAGNOSIS — R2232 Localized swelling, mass and lump, left upper limb: Secondary | ICD-10-CM

## 2021-11-06 ENCOUNTER — Other Ambulatory Visit: Payer: Self-pay | Admitting: Student in an Organized Health Care Education/Training Program

## 2021-12-04 ENCOUNTER — Other Ambulatory Visit: Payer: Self-pay | Admitting: Internal Medicine

## 2021-12-30 ENCOUNTER — Other Ambulatory Visit: Payer: Self-pay | Admitting: Student in an Organized Health Care Education/Training Program

## 2022-01-08 ENCOUNTER — Ambulatory Visit (HOSPITAL_COMMUNITY)
Admission: RE | Admit: 2022-01-08 | Discharge: 2022-01-08 | Disposition: A | Discharge status: Home / Self Care | Payer: Medicare HMO | Source: Ambulatory Visit | Attending: Internal Medicine | Admitting: Internal Medicine

## 2022-01-08 ENCOUNTER — Inpatient Hospital Stay: Payer: Medicare HMO | Attending: Internal Medicine

## 2022-01-08 ENCOUNTER — Other Ambulatory Visit: Payer: Self-pay

## 2022-01-08 DIAGNOSIS — C3490 Malignant neoplasm of unspecified part of unspecified bronchus or lung: Secondary | ICD-10-CM | POA: Diagnosis not present

## 2022-01-08 DIAGNOSIS — C3411 Malignant neoplasm of upper lobe, right bronchus or lung: Secondary | ICD-10-CM | POA: Insufficient documentation

## 2022-01-08 DIAGNOSIS — I10 Essential (primary) hypertension: Secondary | ICD-10-CM | POA: Insufficient documentation

## 2022-01-08 DIAGNOSIS — C782 Secondary malignant neoplasm of pleura: Secondary | ICD-10-CM | POA: Insufficient documentation

## 2022-01-08 DIAGNOSIS — J90 Pleural effusion, not elsewhere classified: Secondary | ICD-10-CM | POA: Diagnosis not present

## 2022-01-08 DIAGNOSIS — Z90710 Acquired absence of both cervix and uterus: Secondary | ICD-10-CM | POA: Insufficient documentation

## 2022-01-08 DIAGNOSIS — Z87891 Personal history of nicotine dependence: Secondary | ICD-10-CM | POA: Insufficient documentation

## 2022-01-08 DIAGNOSIS — Z86718 Personal history of other venous thrombosis and embolism: Secondary | ICD-10-CM | POA: Insufficient documentation

## 2022-01-08 DIAGNOSIS — J439 Emphysema, unspecified: Secondary | ICD-10-CM | POA: Diagnosis not present

## 2022-01-08 DIAGNOSIS — I700 Atherosclerosis of aorta: Secondary | ICD-10-CM | POA: Diagnosis not present

## 2022-01-08 LAB — CMP (CANCER CENTER ONLY)
ALT: 9 U/L (ref 0–44)
AST: 15 U/L (ref 15–41)
Albumin: 4 g/dL (ref 3.5–5.0)
Alkaline Phosphatase: 101 U/L (ref 38–126)
Anion gap: 4 — ABNORMAL LOW (ref 5–15)
BUN: 32 mg/dL — ABNORMAL HIGH (ref 8–23)
CO2: 35 mmol/L — ABNORMAL HIGH (ref 22–32)
Calcium: 9.5 mg/dL (ref 8.9–10.3)
Chloride: 102 mmol/L (ref 98–111)
Creatinine: 0.88 mg/dL (ref 0.44–1.00)
GFR, Estimated: >60 mL/min (ref >60)
Glucose, Bld: 99 mg/dL (ref 70–99)
Potassium: 4 mmol/L (ref 3.5–5.1)
Sodium: 141 mmol/L (ref 135–145)
Total Bilirubin: 0.6 mg/dL (ref 0.3–1.2)
Total Protein: 7 g/dL (ref 6.5–8.1)

## 2022-01-08 LAB — CBC WITH DIFFERENTIAL (CANCER CENTER ONLY)
Abs Immature Granulocytes: 0 10*3/uL (ref 0.00–0.07)
Basophils Absolute: 0 10*3/uL (ref 0.0–0.1)
Basophils Relative: 1 %
Eosinophils Absolute: 0.2 10*3/uL (ref 0.0–0.5)
Eosinophils Relative: 4 %
HCT: 37 % (ref 36.0–46.0)
Hemoglobin: 12.3 g/dL (ref 12.0–15.0)
Immature Granulocytes: 0 %
Lymphocytes Relative: 45 %
Lymphs Abs: 1.8 10*3/uL (ref 0.7–4.0)
MCH: 31.8 pg (ref 26.0–34.0)
MCHC: 33.2 g/dL (ref 30.0–36.0)
MCV: 95.6 fL (ref 80.0–100.0)
Monocytes Absolute: 0.4 10*3/uL (ref 0.1–1.0)
Monocytes Relative: 9 %
Neutro Abs: 1.6 10*3/uL — ABNORMAL LOW (ref 1.7–7.7)
Neutrophils Relative %: 41 %
Platelet Count: 189 10*3/uL (ref 150–400)
RBC: 3.87 MIL/uL (ref 3.87–5.11)
RDW: 15.6 % — ABNORMAL HIGH (ref 11.5–15.5)
WBC Count: 4 10*3/uL (ref 4.0–10.5)
nRBC: 0 % (ref 0.0–0.2)

## 2022-01-08 MED ORDER — SODIUM CHLORIDE (PF) 0.9 % IJ SOLN
INTRAMUSCULAR | Status: AC
  Filled 2022-01-08: qty 50

## 2022-01-08 MED ORDER — IOHEXOL 300 MG/ML  SOLN
100.0000 mL | Freq: Once | INTRAMUSCULAR | Status: AC | PRN
  Administered 2022-01-08: 100 mL via INTRAVENOUS

## 2022-01-10 ENCOUNTER — Other Ambulatory Visit: Payer: Self-pay

## 2022-01-10 ENCOUNTER — Inpatient Hospital Stay (HOSPITAL_BASED_OUTPATIENT_CLINIC_OR_DEPARTMENT_OTHER): Payer: Medicare HMO | Admitting: Internal Medicine

## 2022-01-10 VITALS — BP 130/89 | HR 73 | Temp 98.9°F | Resp 16 | Wt 138.5 lb

## 2022-01-10 DIAGNOSIS — Z86718 Personal history of other venous thrombosis and embolism: Secondary | ICD-10-CM | POA: Diagnosis not present

## 2022-01-10 DIAGNOSIS — I10 Essential (primary) hypertension: Secondary | ICD-10-CM | POA: Diagnosis not present

## 2022-01-10 DIAGNOSIS — Z87891 Personal history of nicotine dependence: Secondary | ICD-10-CM | POA: Diagnosis not present

## 2022-01-10 DIAGNOSIS — C3490 Malignant neoplasm of unspecified part of unspecified bronchus or lung: Secondary | ICD-10-CM

## 2022-01-10 DIAGNOSIS — Z90710 Acquired absence of both cervix and uterus: Secondary | ICD-10-CM | POA: Diagnosis not present

## 2022-01-10 DIAGNOSIS — C782 Secondary malignant neoplasm of pleura: Secondary | ICD-10-CM | POA: Diagnosis not present

## 2022-01-10 DIAGNOSIS — C3411 Malignant neoplasm of upper lobe, right bronchus or lung: Secondary | ICD-10-CM | POA: Diagnosis not present

## 2022-01-10 NOTE — Progress Notes (Signed)
[facility] [phone]) [phone]   [phone]  OFFICE PROGRESS NOTE  [name], MD [place] [address]  DIAGNOSIS: Stage IV non-small cell lung cancer, squamous cell carcinoma. She presented with right upper lobe lung mass in addition to pleural-based metastasis and mediastinal lymphadenopathy. She was diagnosed in September 2020.    PRIOR THERAPY: Chemotherapy with carboplatin for an AUC of 5, paclitaxel 175 mg/m, and Keytruda 200 mg IV every 3 weeks with Neulasta support. Last dose 12/08/19. Status post 18 cycles.  Starting from cycle #5 was on maintenance treatment with single agent Keytruda every 3 weeks. This was discontinued due to evidence of disease progression.    CURRENT THERAPY: Palliative systemic chemotherapy with docetaxel 75 mg/m2 and Cyramza 10 mg/kg IV every 3 weeks with Neulasta support. First dose on 01/04/20.  Status post 24 cycles.  Starting from cycle #10 docetaxel was reduced to 65 Mg/M2.  Her treatment is currently on hold since March 2023 secondary to intolerance and pancolitis  INTERVAL HISTORY: NICOLET GRIFFY 76 y.o. female returns to the clinic today for follow-up visit accompanied by her husband.  The patient is feeling fine today with no concerning complaints except for baseline mild fatigue.  She has no current chest pain, shortness of breath, cough or hemoptysis.  She has no nausea, vomiting, diarrhea or constipation.  She has no headache or visual changes.  She denied having any significant weight loss or night sweats.  She has been in observation recently.  The patient is here today for evaluation with repeat CT scan of the chest, abdomen and pelvis for restaging of her disease.  MEDICAL HISTORY: Past Medical History:  Diagnosis Date   AAA (abdominal aortic aneurysm) ([facility])    COPD (chronic obstructive pulmonary disease) ([facility])    Depression    DVT (deep venous thrombosis) ([facility]) 07/12/2019    Encounter for antineoplastic chemotherapy 12/02/2018   Encounter for antineoplastic immunotherapy 12/02/2018   Essential hypertension    Goals of care, counseling/discussion 12/02/2018   MOST form completed 07/12/19:  DNR/DNI but full scope of usual care. Available in [facility]   Headache    History of migraine headaches    lung ca dx'd 10/2018   Sleep apnea    Tobacco use disorder     ALLERGIES:  is allergic to codeine sulfate and pantoprazole sodium.  MEDICATIONS:  Current Outpatient Medications  Medication Sig Dispense Refill   acetaminophen (TYLENOL) 500 MG tablet Take 500 mg by mouth every 6 (six) hours as needed for moderate pain.     albuterol (VENTOLIN HFA) 108 (90 Base) MCG/ACT inhaler TAKE 2 PUFFS BY MOUTH EVERY 6 HOURS AS NEEDED FOR WHEEZE OR SHORTNESS OF BREATH 26.8 each 2   amLODipine (NORVASC) 10 MG tablet TAKE 1 TABLET EVERY DAY 90 tablet 3   atorvastatin (LIPITOR) 20 MG tablet TAKE 1 TABLET EVERY DAY 90 tablet 3   famotidine (PEPCID) 20 MG tablet Take 20 mg by mouth daily.      FLUoxetine (PROZAC) 20 MG capsule Take 1 capsule (20 mg total) by mouth daily. 90 capsule 3   fluticasone (FLONASE) 50 MCG/ACT nasal spray Place 1 spray into both nostrils daily. 11.1 mL 0   furosemide (LASIX) 40 MG tablet TAKE 1 TABLET BY MOUTH TWICE A DAY 180 tablet 1   gabapentin (NEURONTIN) 100 MG capsule Take 1 capsule (100 mg total) by mouth 2 (two) times daily. 90 capsule 2  lidocaine-prilocaine (EMLA) cream Apply 1 application topically as needed. 30 g 1   magnesium oxide (MAG-OX) 400 (240 Mg) MG tablet TAKE 1 TABLET BY MOUTH EVERY DAY 90 tablet 0   polyvinyl alcohol (LIQUIFILM TEARS) 1.4 % ophthalmic solution Place 1 drop into both eyes as needed for dry eyes.     tiotropium (SPIRIVA HANDIHALER) 18 MCG inhalation capsule Place 1 capsule (18 mcg total) into inhaler and inhale daily. 30 capsule 2   No current facility-administered medications for this visit.    SURGICAL HISTORY:  Past Surgical  History:  Procedure Laterality Date   ABDOMINAL HYSTERECTOMY     CHOLECYSTECTOMY     IR IMAGING GUIDED PORT INSERTION  02/24/2019   ROTATOR CUFF REPAIR  3/04   VIDEO BRONCHOSCOPY WITH ENDOBRONCHIAL ULTRASOUND Right 11/25/2018   Procedure: VIDEO BRONCHOSCOPY WITH ENDOBRONCHIAL ULTRASOUND;  Surgeon: Collene Gobble, MD;  Location: [facility] OR;  Service: Thoracic;  Laterality: Right;    REVIEW OF SYSTEMS:  A comprehensive review of systems was negative except for: Constitutional: positive for fatigue Respiratory: positive for dyspnea on exertion   PHYSICAL EXAMINATION: General appearance: alert, cooperative, fatigued, and no distress Head: Normocephalic, without obvious abnormality, atraumatic Neck: no adenopathy, no JVD, supple, symmetrical, trachea midline, and thyroid not enlarged, symmetric, no tenderness/mass/nodules Lymph nodes: Cervical, supraclavicular, and axillary nodes normal. Resp: clear to auscultation bilaterally Back: symmetric, no curvature. ROM normal. No CVA tenderness. Cardio: regular rate and rhythm, S1, S2 normal, no murmur, click, rub or gallop GI: soft, non-tender; bowel sounds normal; no masses,  no organomegaly Extremities: extremities normal, atraumatic, no cyanosis or edema  ECOG PERFORMANCE STATUS: 1 - Symptomatic but completely ambulatory  There were no vitals taken for this visit.  LABORATORY DATA: Lab Results  Component Value Date   WBC 4.0 01/08/2022   HGB 12.3 01/08/2022   HCT 37.0 01/08/2022   MCV 95.6 01/08/2022   PLT 189 01/08/2022      Chemistry      Component Value Date/Time   NA 141 01/08/2022 1512   NA 142 08/01/2021 1523   K 4.0 01/08/2022 1512   CL 102 01/08/2022 1512   CO2 35 (H) 01/08/2022 1512   BUN 32 (H) 01/08/2022 1512   BUN 22 08/01/2021 1523   CREATININE 0.88 01/08/2022 1512   CREATININE 0.79 09/03/2013 1540      Component Value Date/Time   CALCIUM 9.5 01/08/2022 1512   ALKPHOS 101 01/08/2022 1512   AST 15 01/08/2022 1512    ALT 9 01/08/2022 1512   BILITOT 0.6 01/08/2022 1512       RADIOGRAPHIC STUDIES: CT Chest W Contrast  Result Date: 01/09/2022 CLINICAL DATA:  Metastatic non-small cell lung cancer restaging * Tracking Code: BO * EXAM: CT CHEST, ABDOMEN, AND PELVIS WITH CONTRAST TECHNIQUE: Multidetector CT imaging of the chest, abdomen and pelvis was performed following the standard protocol during bolus administration of intravenous contrast. RADIATION DOSE REDUCTION: This exam was performed according to the departmental dose-optimization program which includes automated exposure control, adjustment of the mA and/or kV according to patient size and/or use of iterative reconstruction technique. CONTRAST:  128m OMNIPAQUE IOHEXOL 300 MG/ML SOLN additional oral enteric contrast COMPARISON:  10/05/2021 FINDINGS: CT CHEST FINDINGS Cardiovascular: Right chest port catheter. Aortic atherosclerosis. Cardiomegaly. Left coronary artery calcifications. Gross enlargement of the main pulmonary artery measuring up to 4.0 cm in caliber. No pericardial effusion. Mediastinum/Nodes: Unchanged prominent subcentimeter pretracheal lymph nodes measuring up to 1.8 x 0.9 cm (series 2, image 41). No overtly  enlarged mediastinal, hilar, or axillary lymph nodes. Thyroid gland, trachea, and esophagus demonstrate no significant findings. Lungs/Pleura: Moderate centrilobular and paraseptal emphysema. Diffuse bilateral bronchial wall thickening. Previously seen small left pleural effusion is almost completely resolved with some residual thickening and volume loss of the left lung base (series 6, image 77). Musculoskeletal: No chest wall abnormality. No acute osseous findings. CT ABDOMEN PELVIS FINDINGS Hepatobiliary: No focal liver abnormality is seen. Status post cholecystectomy. Mild postoperative biliary ductal dilatation. Multiple small cysts and/or hemangiomata scattered throughout the liver as well as subcentimeter lesions too small to characterize  although almost certainly additional tiny cysts. Pancreas: Unremarkable. No pancreatic ductal dilatation or surrounding inflammatory changes. Spleen: Normal in size without significant abnormality. Adrenals/Urinary Tract: Adrenal glands are unremarkable. Kidneys are normal, without renal calculi, solid lesion, or hydronephrosis. Bladder is unremarkable. Stomach/Bowel: Stomach is within normal limits. Appendix is not clearly visualized and may be surgically absent. No evidence of bowel wall thickening, distention, or inflammatory changes. Sigmoid diverticula. 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 No enlarged abdominal or pelvic lymph nodes. Reproductive: Status post hysterectomy. Unchanged cystic lesion in the vicinity of the left adnexa measuring 7.2 x 7.1 cm (series 2, image 88). Other: No abdominal wall hernia.  Anasarca.  No ascites. Musculoskeletal: No acute osseous findings. IMPRESSION: 1. Previously seen small left pleural effusion is almost completely resolved with some residual pleural thickening and unchanged scarring and or atelectasis of the left lung base. 2. No evidence of mass or metastatic disease in the chest, abdomen, or pelvis. 3. 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 Recommend follow-up CT/MR every 6 months and vascular consultation if not otherwise imaged and if clinically appropriate. This recommendation follows ACR consensus guidelines: White Paper of the ACR Incidental  Findings Committee II on Vascular Findings. J Am Coll Radiol 2013; 10:789-794. 4. Unchanged cystic lesion in the vicinity of the left adnexa measuring 7.2 x 7.1 cm. Continued attention on follow-up in the setting of oncologic surveillance. 5. Cardiomegaly and coronary artery disease. 6. Gross enlargement of the main pulmonary artery, as can be seen with pulmonary hypertension. Aortic Atherosclerosis (ICD10-I70.0) and Emphysema (ICD10-J43.9). Electronically Signed   By: Delanna Ahmadi M.D.   On: 01/09/2022 16:59   CT Abdomen Pelvis W Contrast  Result Date: 01/09/2022 CLINICAL DATA:  Metastatic non-small cell lung cancer restaging * Tracking Code: BO * EXAM: CT CHEST, ABDOMEN, AND PELVIS WITH CONTRAST TECHNIQUE: Multidetector CT imaging of the chest, abdomen and pelvis was performed following the standard protocol during bolus administration of intravenous contrast. RADIATION DOSE REDUCTION: This exam was performed according to the departmental dose-optimization program which includes automated exposure control, adjustment of the mA and/or kV according to patient size and/or use of iterative reconstruction technique. CONTRAST:  145m OMNIPAQUE IOHEXOL 300 MG/ML SOLN additional oral enteric contrast COMPARISON:  10/05/2021 FINDINGS: CT CHEST FINDINGS Cardiovascular: Right chest port catheter. Aortic atherosclerosis. Cardiomegaly. Left coronary artery calcifications. Gross enlargement of the main pulmonary artery measuring up to 4.0 cm in caliber. No pericardial effusion. Mediastinum/Nodes:  Unchanged prominent subcentimeter pretracheal lymph nodes measuring up to 1.8 x 0.9 cm (series 2, image 41). No overtly enlarged mediastinal, hilar, or axillary lymph nodes. Thyroid gland, trachea, and esophagus demonstrate no significant findings. Lungs/Pleura: Moderate centrilobular and paraseptal emphysema. Diffuse bilateral bronchial wall thickening. Previously seen small left pleural effusion is almost completely resolved  with some residual thickening and volume loss of the left lung base (series 6, image 77). Musculoskeletal: No chest wall abnormality. No acute osseous findings. CT ABDOMEN PELVIS FINDINGS Hepatobiliary: No focal liver abnormality is seen. Status post cholecystectomy. Mild postoperative biliary ductal dilatation. Multiple small cysts and/or hemangiomata scattered throughout the liver as well as subcentimeter lesions too small to characterize although almost certainly additional tiny cysts. Pancreas: Unremarkable. No pancreatic ductal dilatation or surrounding inflammatory changes. Spleen: Normal in size without significant abnormality. Adrenals/Urinary Tract: Adrenal glands are unremarkable. Kidneys are normal, without renal calculi, solid lesion, or hydronephrosis. Bladder is unremarkable. Stomach/Bowel: Stomach is within normal limits. Appendix is not clearly visualized and may be surgically absent. No evidence of bowel wall thickening, distention, or inflammatory changes. Sigmoid diverticula. 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 No enlarged abdominal or pelvic lymph nodes. Reproductive: Status post hysterectomy. Unchanged cystic lesion in the vicinity of the left adnexa measuring 7.2 x 7.1 cm (series 2, image 88). Other: No abdominal wall hernia.  Anasarca.  No ascites. Musculoskeletal: No acute osseous findings. IMPRESSION: 1. Previously seen small left pleural effusion is almost completely resolved with some residual pleural thickening and unchanged scarring and or atelectasis of the left lung base. 2. No evidence of mass or metastatic disease in the chest, abdomen, or pelvis. 3. Unchanged fusiform aneurysm of  the descending thoracic and abdominal aorta the distal descending thoracic aorta measuring up to 4.5 x 4.4 cm. 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 Recommend follow-up CT/MR every 6 months and vascular consultation if not otherwise imaged and if clinically appropriate. This recommendation follows ACR consensus guidelines: White Paper of the ACR Incidental Findings Committee II on Vascular Findings. J Am Coll Radiol 2013; 10:789-794. 4. Unchanged cystic lesion in the vicinity of the left adnexa measuring 7.2 x 7.1 cm. Continued attention on follow-up in the setting of oncologic surveillance. 5. Cardiomegaly and coronary artery disease. 6. Gross enlargement of the main pulmonary artery, as can be seen with pulmonary hypertension. Aortic Atherosclerosis (ICD10-I70.0) and Emphysema (ICD10-J43.9). Electronically Signed   By: Delanna Ahmadi M.D.   On: 01/09/2022 16:59    ASSESSMENT AND PLAN: This is a very pleasant 76 years old African-American female with stage IV non-small cell carcinoma,, squamous cell carcinoma diagnosed in September 2020.  She presented with extensive right-sided pleural and thoracic nodal hypermetabolic disease with no extrathoracic disease. The patient started induction treatment with systemic chemotherapy with carboplatin, paclitaxel and Keytruda status post 4 cycles with partial response after cycle #4.  This was followed by 14 cycles of maintenance treatment with single agent Keytruda discontinued secondary to disease progression. The patient is currently undergoing second line systemic chemotherapy with docetaxel 75 mg/M2 and Cyramza 10 mg/KG every 3 weeks with Neulasta support.  Status post 24 cycles.  Starting from cycle #  10 her dose of docetaxel was reduced to 65 Mg/M2. Her treatment has been on hold for the last 8 months secondary to pancolitis seen on previous imaging studies in February 2023. Patient  is currently on observation and she is feeling fine with no concerning complaints except for mild fatigue. She had repeat CT scan of the chest, abdomen and pelvis performed recently.  I personally and independently reviewed the scans and discussed the result with the patient and her husband. Her scan showed no concerning findings for disease progression. I recommended for her to continue on observation with repeat CT scan of the chest, abdomen and pelvis in 3 months. The patient was advised to call immediately if she has any other concerning symptoms in the interval. The patient voices understanding of current disease status and treatment options and is in agreement with the current care plan. All questions were answered. The patient knows to call the clinic with any problems, questions or concerns. We can certainly see the patient much sooner if necessary.   Disclaimer: This note was dictated with voice recognition software. Similar sounding words can inadvertently be transcribed and may not be corrected upon review.

## 2022-01-11 ENCOUNTER — Telehealth: Payer: Self-pay | Admitting: Internal Medicine

## 2022-01-11 ENCOUNTER — Other Ambulatory Visit: Payer: Self-pay

## 2022-01-11 ENCOUNTER — Encounter: Payer: Self-pay | Admitting: Internal Medicine

## 2022-01-11 NOTE — Telephone Encounter (Signed)
Spoke with patient confirming  1/22 appointments

## 2022-01-22 ENCOUNTER — Encounter: Payer: Self-pay | Admitting: Medical Oncology

## 2022-01-29 ENCOUNTER — Other Ambulatory Visit: Payer: Self-pay

## 2022-02-11 ENCOUNTER — Encounter: Payer: Self-pay | Admitting: Student in an Organized Health Care Education/Training Program

## 2022-02-11 ENCOUNTER — Encounter: Payer: Medicare HMO | Admitting: Student in an Organized Health Care Education/Training Program

## 2022-02-26 DIAGNOSIS — H524 Presbyopia: Secondary | ICD-10-CM | POA: Diagnosis not present

## 2022-02-26 DIAGNOSIS — H2513 Age-related nuclear cataract, bilateral: Secondary | ICD-10-CM | POA: Diagnosis not present

## 2022-02-26 DIAGNOSIS — H5203 Hypermetropia, bilateral: Secondary | ICD-10-CM | POA: Diagnosis not present

## 2022-02-26 DIAGNOSIS — H52223 Regular astigmatism, bilateral: Secondary | ICD-10-CM | POA: Diagnosis not present

## 2022-02-26 DIAGNOSIS — Z135 Encounter for screening for eye and ear disorders: Secondary | ICD-10-CM | POA: Diagnosis not present

## 2022-03-05 ENCOUNTER — Other Ambulatory Visit: Payer: Self-pay | Admitting: Internal Medicine

## 2022-03-07 ENCOUNTER — Ambulatory Visit: Payer: Self-pay | Admitting: Licensed Clinical Social Worker

## 2022-03-07 NOTE — Patient Outreach (Signed)
SW removed self from care team.   Jahzion Brogden, BSW, MSW, LCSW-A  Social Worker [facility]  [phone] 

## 2022-04-09 ENCOUNTER — Other Ambulatory Visit: Payer: Self-pay | Admitting: Physician Assistant

## 2022-04-09 DIAGNOSIS — G6289 Other specified polyneuropathies: Secondary | ICD-10-CM

## 2022-04-10 ENCOUNTER — Ambulatory Visit (HOSPITAL_COMMUNITY)
Admission: RE | Admit: 2022-04-10 | Discharge: 2022-04-10 | Disposition: A | Discharge status: Home / Self Care | Payer: Medicare HMO | Source: Ambulatory Visit | Attending: Internal Medicine | Admitting: Internal Medicine

## 2022-04-10 DIAGNOSIS — J439 Emphysema, unspecified: Secondary | ICD-10-CM | POA: Diagnosis not present

## 2022-04-10 DIAGNOSIS — K5730 Diverticulosis of large intestine without perforation or abscess without bleeding: Secondary | ICD-10-CM | POA: Diagnosis not present

## 2022-04-10 DIAGNOSIS — C3490 Malignant neoplasm of unspecified part of unspecified bronchus or lung: Secondary | ICD-10-CM | POA: Insufficient documentation

## 2022-04-10 DIAGNOSIS — R918 Other nonspecific abnormal finding of lung field: Secondary | ICD-10-CM | POA: Diagnosis not present

## 2022-04-10 DIAGNOSIS — K769 Liver disease, unspecified: Secondary | ICD-10-CM | POA: Diagnosis not present

## 2022-04-10 MED ORDER — IOHEXOL 300 MG/ML  SOLN
100.0000 mL | Freq: Once | INTRAMUSCULAR | Status: AC | PRN
  Administered 2022-04-10: 100 mL via INTRAVENOUS

## 2022-04-11 ENCOUNTER — Telehealth: Payer: Self-pay | Admitting: Medical Oncology

## 2022-04-11 DIAGNOSIS — I714 Abdominal aortic aneurysm, without rupture, unspecified: Secondary | ICD-10-CM

## 2022-04-11 NOTE — Telephone Encounter (Signed)
Ct results called . Message sent to [facility] and Dr. Erlinda Hong.

## 2022-04-12 NOTE — Telephone Encounter (Addendum)
Thank you for alerting me to this CT finding. I spoke with the patient by phone this morning, she is well with no symptoms from the AAA. She last consulted with Dr. Lenell Antu at vascular in March 2023. At that time they decided not to do elective repair because the AAA was less than 5 cm and she had a limited life expectancy. They recommended 6 month follow up in their office which we are overdue for. I will refer her back to Dr. Lenell Antu where they can again discuss risks and benefits of elective repair in the context of her frailty and comorbidities.   She is to see Dr. Arbutus Ped on Monday. I did not discuss the pelvic mass lesion with her yet. I am wondering about utility of pelvic ultrasound to better characterize that lesion, at risk for ovarian malignancy.   She is overdue to see me in my office. I will reach out to my front desk to schedule her follow up with me as well.

## 2022-04-12 NOTE — Addendum Note (Signed)
Addended by: Erlinda Hong T on: 04/12/2022 08:57 AM   Modules accepted: Orders

## 2022-04-15 ENCOUNTER — Inpatient Hospital Stay: Payer: Medicare HMO | Attending: Internal Medicine | Admitting: Internal Medicine

## 2022-04-15 ENCOUNTER — Inpatient Hospital Stay: Payer: Medicare HMO

## 2022-04-15 ENCOUNTER — Other Ambulatory Visit: Payer: Self-pay

## 2022-04-15 VITALS — BP 146/90 | HR 73 | Temp 98.6°F | Resp 17 | Wt 135.5 lb

## 2022-04-15 DIAGNOSIS — C782 Secondary malignant neoplasm of pleura: Secondary | ICD-10-CM | POA: Diagnosis not present

## 2022-04-15 DIAGNOSIS — G4730 Sleep apnea, unspecified: Secondary | ICD-10-CM | POA: Diagnosis not present

## 2022-04-15 DIAGNOSIS — F17210 Nicotine dependence, cigarettes, uncomplicated: Secondary | ICD-10-CM | POA: Diagnosis not present

## 2022-04-15 DIAGNOSIS — Z79899 Other long term (current) drug therapy: Secondary | ICD-10-CM | POA: Diagnosis not present

## 2022-04-15 DIAGNOSIS — C3490 Malignant neoplasm of unspecified part of unspecified bronchus or lung: Secondary | ICD-10-CM

## 2022-04-15 DIAGNOSIS — J449 Chronic obstructive pulmonary disease, unspecified: Secondary | ICD-10-CM | POA: Diagnosis not present

## 2022-04-15 DIAGNOSIS — R5383 Other fatigue: Secondary | ICD-10-CM | POA: Diagnosis not present

## 2022-04-15 DIAGNOSIS — J432 Centrilobular emphysema: Secondary | ICD-10-CM | POA: Diagnosis not present

## 2022-04-15 DIAGNOSIS — Z86718 Personal history of other venous thrombosis and embolism: Secondary | ICD-10-CM | POA: Diagnosis not present

## 2022-04-15 DIAGNOSIS — I7143 Infrarenal abdominal aortic aneurysm, without rupture: Secondary | ICD-10-CM | POA: Diagnosis not present

## 2022-04-15 DIAGNOSIS — I714 Abdominal aortic aneurysm, without rupture, unspecified: Secondary | ICD-10-CM | POA: Diagnosis not present

## 2022-04-15 DIAGNOSIS — C3411 Malignant neoplasm of upper lobe, right bronchus or lung: Secondary | ICD-10-CM | POA: Diagnosis not present

## 2022-04-15 DIAGNOSIS — I10 Essential (primary) hypertension: Secondary | ICD-10-CM | POA: Diagnosis not present

## 2022-04-15 LAB — CMP (CANCER CENTER ONLY)
ALT: 7 U/L (ref 0–44)
AST: 12 U/L — ABNORMAL LOW (ref 15–41)
Albumin: 4 g/dL (ref 3.5–5.0)
Alkaline Phosphatase: 120 U/L (ref 38–126)
Anion gap: 5 (ref 5–15)
BUN: 14 mg/dL (ref 8–23)
CO2: 35 mmol/L — ABNORMAL HIGH (ref 22–32)
Calcium: 9.8 mg/dL (ref 8.9–10.3)
Chloride: 98 mmol/L (ref 98–111)
Creatinine: 0.79 mg/dL (ref 0.44–1.00)
GFR, Estimated: >60 mL/min (ref >60)
Glucose, Bld: 83 mg/dL (ref 70–99)
Potassium: 3.5 mmol/L (ref 3.5–5.1)
Sodium: 138 mmol/L (ref 135–145)
Total Bilirubin: 1 mg/dL (ref 0.3–1.2)
Total Protein: 7 g/dL (ref 6.5–8.1)

## 2022-04-15 LAB — CBC WITH DIFFERENTIAL (CANCER CENTER ONLY)
Abs Immature Granulocytes: 0.01 10*3/uL (ref 0.00–0.07)
Basophils Absolute: 0 10*3/uL (ref 0.0–0.1)
Basophils Relative: 1 %
Eosinophils Absolute: 0.1 10*3/uL (ref 0.0–0.5)
Eosinophils Relative: 3 %
HCT: 39.7 % (ref 36.0–46.0)
Hemoglobin: 13.4 g/dL (ref 12.0–15.0)
Immature Granulocytes: 0 %
Lymphocytes Relative: 40 %
Lymphs Abs: 1.4 10*3/uL (ref 0.7–4.0)
MCH: 32.2 pg (ref 26.0–34.0)
MCHC: 33.8 g/dL (ref 30.0–36.0)
MCV: 95.4 fL (ref 80.0–100.0)
Monocytes Absolute: 0.4 10*3/uL (ref 0.1–1.0)
Monocytes Relative: 11 %
Neutro Abs: 1.6 10*3/uL — ABNORMAL LOW (ref 1.7–7.7)
Neutrophils Relative %: 45 %
Platelet Count: 211 10*3/uL (ref 150–400)
RBC: 4.16 MIL/uL (ref 3.87–5.11)
RDW: 13.5 % (ref 11.5–15.5)
WBC Count: 3.5 10*3/uL — ABNORMAL LOW (ref 4.0–10.5)
nRBC: 0 % (ref 0.0–0.2)

## 2022-04-15 NOTE — Progress Notes (Signed)
[facility] [phone]   [phone]  OFFICE PROGRESS NOTE  Tyson Alias, MD [address] [place] [address]  DIAGNOSIS: Stage IV non-small cell lung cancer, squamous cell carcinoma. She presented with right upper lobe lung mass in addition to pleural-based metastasis and mediastinal lymphadenopathy. She was diagnosed in September 2020.    PRIOR THERAPY: Chemotherapy with carboplatin for an AUC of 5, paclitaxel 175 mg/m, and Keytruda 200 mg IV every 3 weeks with Neulasta support. Last dose 12/08/19. Status post 18 cycles.  Starting from cycle #5 was on maintenance treatment with single agent Keytruda every 3 weeks. This was discontinued due to evidence of disease progression.    CURRENT THERAPY: Palliative systemic chemotherapy with docetaxel 75 mg/m2 and Cyramza 10 mg/kg IV every 3 weeks with Neulasta support. First dose on 01/04/20.  Status post 24 cycles.  Starting from cycle #10 docetaxel was reduced to 65 Mg/M2.  Her treatment is currently on hold since March 2023 secondary to intolerance and pancolitis  INTERVAL HISTORY: Debra Rodgers 77 y.o. female returns to the clinic today for follow-up visit accompanied by her husband.  The patient is feeling fine today with no concerning complaints except for the baseline fatigue and shortness of breath with exertion.  She has no current chest pain, cough or hemoptysis.  She has no nausea, vomiting, diarrhea or constipation.  She has no headache or visual changes.  She denied having any significant weight loss or night sweats.  She has no fever or chills.  The patient had repeat CT scan of the chest, abdomen and pelvis performed recently and she is here for evaluation and discussion of her scan results.  MEDICAL HISTORY: Past Medical History:  Diagnosis Date   AAA (abdominal aortic aneurysm) (HCC)    COPD (chronic obstructive pulmonary disease) (HCC)    Depression    DVT (deep venous  thrombosis) (HCC) 07/12/2019   Encounter for antineoplastic chemotherapy 12/02/2018   Encounter for antineoplastic immunotherapy 12/02/2018   Essential hypertension    Goals of care, counseling/discussion 12/02/2018   MOST form completed 07/12/19:  DNR/DNI but full scope of usual care. Available in [facility]   Headache    History of migraine headaches    lung ca dx'd 10/2018   Sleep apnea    Tobacco use disorder     ALLERGIES:  is allergic to codeine sulfate and pantoprazole sodium.  MEDICATIONS:  Current Outpatient Medications  Medication Sig Dispense Refill   acetaminophen (TYLENOL) 500 MG tablet Take 500 mg by mouth every 6 (six) hours as needed for moderate pain.     albuterol (VENTOLIN HFA) 108 (90 Base) MCG/ACT inhaler TAKE 2 PUFFS BY MOUTH EVERY 6 HOURS AS NEEDED FOR WHEEZE OR SHORTNESS OF BREATH 26.8 each 2   amLODipine (NORVASC) 10 MG tablet TAKE 1 TABLET EVERY DAY 90 tablet 3   atorvastatin (LIPITOR) 20 MG tablet TAKE 1 TABLET EVERY DAY 90 tablet 3   famotidine (PEPCID) 20 MG tablet Take 20 mg by mouth daily.      FLUoxetine (PROZAC) 20 MG capsule Take 1 capsule (20 mg total) by mouth daily. 90 capsule 3   fluticasone (FLONASE) 50 MCG/ACT nasal spray Place 1 spray into both nostrils daily. 11.1 mL 0   furosemide (LASIX) 40 MG tablet TAKE 1 TABLET BY MOUTH TWICE A DAY 180 tablet 1   gabapentin (NEURONTIN) 100 MG capsule TAKE 1 CAPSULE TWICE DAILY 90 capsule 3  lidocaine-prilocaine (EMLA) cream Apply 1 application topically as needed. 30 g 1   magnesium oxide (MAG-OX) 400 (240 Mg) MG tablet TAKE 1 TABLET BY MOUTH EVERY DAY 90 tablet 0   polyvinyl alcohol (LIQUIFILM TEARS) 1.4 % ophthalmic solution Place 1 drop into both eyes as needed for dry eyes.     tiotropium (SPIRIVA HANDIHALER) 18 MCG inhalation capsule Place 1 capsule (18 mcg total) into inhaler and inhale daily. 30 capsule 2   No current facility-administered medications for this visit.    SURGICAL HISTORY:  Past Surgical  History:  Procedure Laterality Date   ABDOMINAL HYSTERECTOMY     CHOLECYSTECTOMY     IR IMAGING GUIDED PORT INSERTION  02/24/2019   ROTATOR CUFF REPAIR  3/04   VIDEO BRONCHOSCOPY WITH ENDOBRONCHIAL ULTRASOUND Right 11/25/2018   Procedure: VIDEO BRONCHOSCOPY WITH ENDOBRONCHIAL ULTRASOUND;  Surgeon: Leslye Peer, MD;  Location: [facility] OR;  Service: Thoracic;  Laterality: Right;    REVIEW OF SYSTEMS:  Constitutional: positive for fatigue Eyes: negative Ears, nose, mouth, throat, and face: negative Respiratory: positive for dyspnea on exertion Cardiovascular: negative Gastrointestinal: negative Genitourinary:negative Integument/breast: negative Hematologic/lymphatic: negative Musculoskeletal:negative Neurological: negative Behavioral/Psych: negative Endocrine: negative Allergic/Immunologic: negative   PHYSICAL EXAMINATION: General appearance: alert, cooperative, fatigued, and no distress Head: Normocephalic, without obvious abnormality, atraumatic Neck: no adenopathy, no JVD, supple, symmetrical, trachea midline, and thyroid not enlarged, symmetric, no tenderness/mass/nodules Lymph nodes: Cervical, supraclavicular, and axillary nodes normal. Resp: clear to auscultation bilaterally Back: symmetric, no curvature. ROM normal. No CVA tenderness. Cardio: regular rate and rhythm, S1, S2 normal, no murmur, click, rub or gallop GI: soft, non-tender; bowel sounds normal; no masses,  no organomegaly Extremities: extremities normal, atraumatic, no cyanosis or edema Neurologic: Alert and oriented X 3, normal strength and tone. Normal symmetric reflexes. Normal coordination and gait  ECOG PERFORMANCE STATUS: 1 - Symptomatic but completely ambulatory  Blood pressure (!) 146/90, pulse 73, temperature 98.6 F (37 C), temperature source Oral, resp. rate 17, weight 135 lb 8 oz (61.5 kg), SpO2 97 %.  LABORATORY DATA: Lab Results  Component Value Date   WBC 3.5 (L) 04/15/2022   HGB 13.4 04/15/2022    HCT 39.7 04/15/2022   MCV 95.4 04/15/2022   PLT 211 04/15/2022      Chemistry      Component Value Date/Time   NA 138 04/15/2022 1335   NA 142 08/01/2021 1523   K 3.5 04/15/2022 1335   CL 98 04/15/2022 1335   CO2 35 (H) 04/15/2022 1335   BUN 14 04/15/2022 1335   BUN 22 08/01/2021 1523   CREATININE 0.79 04/15/2022 1335   CREATININE 0.79 09/03/2013 1540      Component Value Date/Time   CALCIUM 9.8 04/15/2022 1335   ALKPHOS 120 04/15/2022 1335   AST 12 (L) 04/15/2022 1335   ALT 7 04/15/2022 1335   BILITOT 1.0 04/15/2022 1335       RADIOGRAPHIC STUDIES: CT Chest W Contrast  Result Date: 04/10/2022 CLINICAL DATA:  Staging non-small-cell lung cancer * Tracking Code: BO * EXAM: CT CHEST, ABDOMEN, AND PELVIS WITH CONTRAST TECHNIQUE: Multidetector CT imaging of the chest, abdomen and pelvis was performed following the standard protocol during bolus administration of intravenous contrast. RADIATION DOSE REDUCTION: This exam was performed according to the departmental dose-optimization program which includes automated exposure control, adjustment of the mA and/or kV according to patient size and/or use of iterative reconstruction technique. CONTRAST:  OMNIPAQUE IOHEXOL 300 MG/ML  SOLN COMPARISON:  01/08/2022 and older FINDINGS:  CT CHEST FINDINGS Cardiovascular: Right upper chest port identified. Heart is nonenlarged. No significant pericardial effusion. Only small. Coronary artery calcifications are seen. The thoracic aorta has some scattered vascular calcifications. There is ectasia of the aorta at the level just above the diaphragm with diameter approaching 4.8 4.2 cm, unchanged. There is some enlargement of the pulmonary arteries. Please correlate for evidence of pulmonary artery hypertension. Mediastinum/Nodes: No specific abnormal lymph node enlargement identified in the axillary regions, hilum or mediastinum. The previous precarinal node which measured 18 x 9 mm is stable today on  series 504 image 23. Normal caliber thoracic esophagus. Lungs/Pleura: Upper lobe paraseptal and centrilobular emphysematous changes greater than lower lobes. There are areas of interstitial septal thickening including some thickening along the major interlobar fissure of the left lung. Some pleural thickening at the left lung base with trace pleural fluid is again seen. There is some small areas of ground-glass nodularity identified. Example right lower lobe on series 506, image 93 measuring 8 mm. This is new from previous. Small subpleural air anterior along the left side on image 64 of series 506 measures 6 mm is also new. Additional small focus of the upper lobe on image 55 measuring 5 mm. These are nonspecific and would recommend simple follow-up in 3-6 months. Musculoskeletal: Curvature of the spine with some degenerative changes. If there is concern of osseous metastatic disease bone scan can be performed as clinically indicated. CT ABDOMEN PELVIS FINDINGS Hepatobiliary: Liver is some small low-attenuation lesions, unchanged from previous. These are consistent with multiple benign cystic lesions. Several are too small to completely characterize but unchanged from previous. Mild ectasia of the biliary tree centrally but normal tapering towards the pancreatic head. Slight ectasia of the cystic duct remnant. Gallbladder is not clearly seen. Pancreas: Preserved pancreatic parenchyma.  No focal atrophy. Spleen: The spleen is nonenlarged. Adrenals/Urinary Tract: The adrenal glands are preserved. Mild-to-moderate bilateral renal atrophy. Left is slightly worse than right. No obvious enhancing renal mass. No collecting system dilatation. Preserved contours of the urinary bladder. Stomach/Bowel: On this non oral contrast exam, large bowel has a normal course and caliber with scattered stool. Left-sided colonic diverticula. The appendix is not clearly seen in the right lower quadrant but no pericecal stranding or fluid.  The cecum resides in the low anterior right hemipelvis. The stomach and small bowel are nondilated on this non oral contrast exam. Vascular/Lymphatic: Infrarenal abdominal aortic aneurysm identified with mural plaque and thrombus. There is a saccular area to the right side with some irregularity and ulceration. Overall appearance is similar to previous examination. Diameter today on image 56 of series 504 would measure 5.0 x 4.0 cm. On the prior study when measured in the same fashion as when measured 4.8 by 4.1 cm, increasing in size over a short time interval. Recommend further evaluation. There is dilatation of the common iliac arteries with extensive atherosclerotic plaque and areas of stenosis particularly along the internal iliac vessels. No specific abnormal lymph node enlargement seen in the abdomen and pelvis. Reproductive: Once again there cystic lesion left adnexa which is likely ovarian and today measures 7.7 x 7.3 cm on series 504, image 88 and going back to the prior study measuring in the same fashion is today would have measured 7.3 x 7.2 cm, further slightly enlarged. Other: No ascites.  Mild anasarca. Musculoskeletal: Scattered degenerative changes of the spine and pelvis. There is multilevel disc bulging and posterior osteophytes along the lumbar spine with stenosis. Again if there is  concern of osseous metastatic disease, bone scan can be performed as clinically indicated. IMPRESSION: Further increasing size of the infrarenal abdominal aortic aneurysm now measuring up to 5 cm. Once again there is evidence of a penetrating ulcer along the plaque to the right side. With the size change would recommend further evaluation and vascular surgery consultation when appropriate. Recommend referral to a vascular specialist. This recommendation follows ACR consensus guidelines: White Paper of the ACR Incidental Findings Committee II on Vascular Findings. J Am Coll Radiol 2013; 10:789-794. Advanced  emphysematous changes in lungs. There is development of some tiny ill-defined ground-glass nodules in both lungs. Based on appearance and size recommend follow-up in 3-6 months. Cystic lesion in the left hemipelvis has slightly increased in size from the previous exam when measured in the same fashion as the prior. Please correlate with any prior workup or follow-up ultrasound. Because this lesion is not adequately characterized, prompt [place] is recommended for further evaluation. Note: This recommendation does not apply to premenarchal patients and to those with increased risk (genetic, family history, elevated tumor markers or other high-risk factors) of ovarian cancer. Reference: JACR 2020 Feb; 17(2):248-254 No new mass lesion, lymph node enlargement. The physician radiology assistant team will call the ordering service to discuss the findings. Electronically Signed   By: Karen Kays M.D.   On: 04/10/2022 15:09   CT Abdomen Pelvis W Contrast  Result Date: 04/10/2022 CLINICAL DATA:  Staging non-small-cell lung cancer * Tracking Code: BO * EXAM: CT CHEST, ABDOMEN, AND PELVIS WITH CONTRAST TECHNIQUE: Multidetector CT imaging of the chest, abdomen and pelvis was performed following the standard protocol during bolus administration of intravenous contrast. RADIATION DOSE REDUCTION: This exam was performed according to the departmental dose-optimization program which includes automated exposure control, adjustment of the mA and/or kV according to patient size and/or use of iterative reconstruction technique. CONTRAST:  OMNIPAQUE IOHEXOL 300 MG/ML  SOLN COMPARISON:  01/08/2022 and older FINDINGS: CT CHEST FINDINGS Cardiovascular: Right upper chest port identified. Heart is nonenlarged. No significant pericardial effusion. Only small. Coronary artery calcifications are seen. The thoracic aorta has some scattered vascular calcifications. There is ectasia of the aorta at the level just above the diaphragm with  diameter approaching 4.8 4.2 cm, unchanged. There is some enlargement of the pulmonary arteries. Please correlate for evidence of pulmonary artery hypertension. Mediastinum/Nodes: No specific abnormal lymph node enlargement identified in the axillary regions, hilum or mediastinum. The previous precarinal node which measured 18 x 9 mm is stable today on series 504 image 23. Normal caliber thoracic esophagus. Lungs/Pleura: Upper lobe paraseptal and centrilobular emphysematous changes greater than lower lobes. There are areas of interstitial septal thickening including some thickening along the major interlobar fissure of the left lung. Some pleural thickening at the left lung base with trace pleural fluid is again seen. There is some small areas of ground-glass nodularity identified. Example right lower lobe on series 506, image 93 measuring 8 mm. This is new from previous. Small subpleural air anterior along the left side on image 64 of series 506 measures 6 mm is also new. Additional small focus of the upper lobe on image 55 measuring 5 mm. These are nonspecific and would recommend simple follow-up in 3-6 months. Musculoskeletal: Curvature of the spine with some degenerative changes. If there is concern of osseous metastatic disease bone scan can be performed as clinically indicated. CT ABDOMEN PELVIS FINDINGS Hepatobiliary: Liver is some small low-attenuation lesions, unchanged from previous. These are consistent with multiple benign  cystic lesions. Several are too small to completely characterize but unchanged from previous. Mild ectasia of the biliary tree centrally but normal tapering towards the pancreatic head. Slight ectasia of the cystic duct remnant. Gallbladder is not clearly seen. Pancreas: Preserved pancreatic parenchyma.  No focal atrophy. Spleen: The spleen is nonenlarged. Adrenals/Urinary Tract: The adrenal glands are preserved. Mild-to-moderate bilateral renal atrophy. Left is slightly worse than  right. No obvious enhancing renal mass. No collecting system dilatation. Preserved contours of the urinary bladder. Stomach/Bowel: On this non oral contrast exam, large bowel has a normal course and caliber with scattered stool. Left-sided colonic diverticula. The appendix is not clearly seen in the right lower quadrant but no pericecal stranding or fluid. The cecum resides in the low anterior right hemipelvis. The stomach and small bowel are nondilated on this non oral contrast exam. Vascular/Lymphatic: Infrarenal abdominal aortic aneurysm identified with mural plaque and thrombus. There is a saccular area to the right side with some irregularity and ulceration. Overall appearance is similar to previous examination. Diameter today on image 56 of series 504 would measure 5.0 x 4.0 cm. On the prior study when measured in the same fashion as when measured 4.8 by 4.1 cm, increasing in size over a short time interval. Recommend further evaluation. There is dilatation of the common iliac arteries with extensive atherosclerotic plaque and areas of stenosis particularly along the internal iliac vessels. No specific abnormal lymph node enlargement seen in the abdomen and pelvis. Reproductive: Once again there cystic lesion left adnexa which is likely ovarian and today measures 7.7 x 7.3 cm on series 504, image 88 and going back to the prior study measuring in the same fashion is today would have measured 7.3 x 7.2 cm, further slightly enlarged. Other: No ascites.  Mild anasarca. Musculoskeletal: Scattered degenerative changes of the spine and pelvis. There is multilevel disc bulging and posterior osteophytes along the lumbar spine with stenosis. Again if there is concern of osseous metastatic disease, bone scan can be performed as clinically indicated. IMPRESSION: Further increasing size of the infrarenal abdominal aortic aneurysm now measuring up to 5 cm. Once again there is evidence of a penetrating ulcer along the plaque  to the right side. With the size change would recommend further evaluation and vascular surgery consultation when appropriate. Recommend referral to a vascular specialist. This recommendation follows ACR consensus guidelines: White Paper of the ACR Incidental Findings Committee II on Vascular Findings. J Am Coll Radiol 2013; 10:789-794. Advanced emphysematous changes in lungs. There is development of some tiny ill-defined ground-glass nodules in both lungs. Based on appearance and size recommend follow-up in 3-6 months. Cystic lesion in the left hemipelvis has slightly increased in size from the previous exam when measured in the same fashion as the prior. Please correlate with any prior workup or follow-up ultrasound. Because this lesion is not adequately characterized, prompt [place] is recommended for further evaluation. Note: This recommendation does not apply to premenarchal patients and to those with increased risk (genetic, family history, elevated tumor markers or other high-risk factors) of ovarian cancer. Reference: JACR 2020 Feb; 17(2):248-254 No new mass lesion, lymph node enlargement. The physician radiology assistant team will call the ordering service to discuss the findings. Electronically Signed   By: Karen Kays M.D.   On: 04/10/2022 15:09    ASSESSMENT AND PLAN: This is a very pleasant 77 years old African-American female with stage IV non-small cell carcinoma,, squamous cell carcinoma diagnosed in September 2020.  She presented with extensive  right-sided pleural and thoracic nodal hypermetabolic disease with no extrathoracic disease. The patient started induction treatment with systemic chemotherapy with carboplatin, paclitaxel and Keytruda status post 4 cycles with partial response after cycle #4.  This was followed by 14 cycles of maintenance treatment with single agent Keytruda discontinued secondary to disease progression. The patient is currently undergoing second line systemic chemotherapy  with docetaxel 75 mg/M2 and Cyramza 10 mg/KG every 3 weeks with Neulasta support.  Status post 24 cycles.  Starting from cycle #10 her dose of docetaxel was reduced to 65 Mg/M2. Her treatment has been on hold for the last 11 months secondary to pancolitis seen on previous imaging studies in February 2023. The patient is currently on observation and she is feeling fine with no concerning complaints except for mild fatigue. She had repeat CT scan of the chest, abdomen and pelvis that showed no concerning findings for disease progression. Her scan showed incidental finding of further increase in the size of the infrarenal abdominal aortic aneurysm measuring up to 5.0 cm.  There was also a penetrating ulcer along the plaque to the right side.  Her scan was shared with her primary care physician Dr. Oswaldo Done and the patient was strongly advised to see vascular surgery for further evaluation and recommendation regarding her condition.  She is currently asymptomatic. She was also advised to monitor her blood pressure closely and take her medication as prescribed. The patient will come back for follow-up visit in 3 months for evaluation and repeat CT scan of the chest, abdomen and pelvis for restaging of her disease. She was advised to call immediately if she has any other concerning symptoms in the interval. The patient voices understanding of current disease status and treatment options and is in agreement with the current care plan. All questions were answered. The patient knows to call the clinic with any problems, questions or concerns. We can certainly see the patient much sooner if necessary.   Disclaimer: This note was dictated with voice recognition software. Similar sounding words can inadvertently be transcribed and may not be corrected upon review.

## 2022-04-16 ENCOUNTER — Telehealth: Payer: Self-pay | Admitting: Internal Medicine

## 2022-04-16 NOTE — Telephone Encounter (Signed)
Called patient to r/s appointment to go over CT results. Patient r/s and notified.

## 2022-04-23 ENCOUNTER — Encounter: Payer: Self-pay | Admitting: Vascular Surgery

## 2022-04-23 ENCOUNTER — Ambulatory Visit: Payer: Medicare HMO | Admitting: Vascular Surgery

## 2022-04-23 VITALS — BP 144/89 | HR 76 | Temp 98.0°F | Resp 20 | Ht 62.0 in | Wt 136.0 lb

## 2022-04-23 DIAGNOSIS — I7162 Paravisceral aneurysm of the thoracoabdominal aorta, without rupture: Secondary | ICD-10-CM

## 2022-04-23 NOTE — Progress Notes (Signed)
ASSESSMENT & PLAN:  Debra Rodgers is a 77 y.o. female with a slowly growing extent IV thoracoabdominal aneurysm in setting of stage IV non-small cell lung cancer. Her lung cancer has been stable and has not progressed since stopping palliative chemotherapy in early 2023.  She is not a candidate for open repair. She may be a candidate for advanced endovascular repair. I will refer her to Dr. Sammuel Hines for further evaluation.   CHIEF COMPLAINT:   aneurysm  HISTORY:  HISTORY OF PRESENT ILLNESS: Debra Rodgers is a 77 y.o. female referred for evaluation of abdominal aortic aneurysm demonstrated on CT scan in the [facility] 04/26/2020.  She has a history of stage IV non-small cell lung cancer for which she is undergoing palliative chemotherapy as directed by Dr. Earlie Server.  She also has a history of DVT.  From an aneurysm perspective, she is asymptomatic.  11/07/20: patient has been undergoing palliative chemotherapy. She seems to be tolerating this well. Continues to have surveillance CT for cancer surveillance.   05/29/21: Patient returns to clinic.  Palliative chemotherapy has been stopped because of fatigue and stable disease.  The patient describes severe deconditioning.  She is able to walk around the house and attend to her ADLs.  She has difficulty getting out of the house.  She is walking with a walker.  She is in a wheelchair in clinic today.  Globally, she appears much worse than the last time I saw her.  04/23/22: Patient returns for surveillance. Her AAA has grown slightly on recent CT. She is doing better overall today. She is walking without an assistive device. She has no complaints except some mild fatigue. She is more able to do ADLs and IADLs.  Past Medical History:  Diagnosis Date   AAA (abdominal aortic aneurysm) (HCC)    COPD (chronic obstructive pulmonary disease) (HCC)    Depression    DVT (deep venous thrombosis) ([place]) 07/12/2019   Encounter for antineoplastic  chemotherapy 12/02/2018   Encounter for antineoplastic immunotherapy 12/02/2018   Essential hypertension    Goals of care, counseling/discussion 12/02/2018   MOST form completed 07/12/19:  DNR/DNI but full scope of usual care. Available in [facility]   Headache    History of migraine headaches    lung ca dx'd 10/2018   Sleep apnea    Tobacco use disorder     Past Surgical History:  Procedure Laterality Date   ABDOMINAL HYSTERECTOMY     CHOLECYSTECTOMY     IR IMAGING GUIDED PORT INSERTION  02/24/2019   ROTATOR CUFF REPAIR  3/04   VIDEO BRONCHOSCOPY WITH ENDOBRONCHIAL ULTRASOUND Right 11/25/2018   Procedure: VIDEO BRONCHOSCOPY WITH ENDOBRONCHIAL ULTRASOUND;  Surgeon: Collene Gobble, MD;  Location: [facility] OR;  Service: Thoracic;  Laterality: Right;    Family History  Problem Relation Age of Onset   Hypertension Mother    Stroke Mother    Coronary artery disease Mother    Heart disease Father    Diabetes Sister    Hypertension Sister    Cancer Sister    Breast cancer Sister     Social History   Socioeconomic History   Marital status: Married    Spouse name: Not on file   Number of children: Not on file   Years of education: Not on file   Highest education level: Not on file  Occupational History   Not on file  Tobacco Use   Smoking status: Former    Packs/day: 1.00  Years: 56.00    Total pack years: 56.00    Types: Cigarettes    Start date: 78    Quit date: 11/04/2018    Years since quitting: 3.4   Smokeless tobacco: Never  Vaping Use   Vaping Use: Never used  Substance and Sexual Activity   Alcohol use: No    Alcohol/week: 0.0 standard drinks of alcohol   Drug use: No   Sexual activity: Never    Partners: Male  Other Topics Concern   Not on file  Social History Narrative   Married, Regular Exercise- yes   Social Determinants of Health   Financial Resource Strain: Low Risk  (06/22/2021)   Overall Financial Resource Strain (CARDIA)    Difficulty of Paying Living  Expenses: Not hard at all  Food Insecurity: No Food Insecurity (06/22/2021)   Hunger Vital Sign    Worried About Running Out of Food in the Last Year: Never true    Ran Out of Food in the Last Year: Never true  Transportation Needs: No Transportation Needs (06/22/2021)   PRAPARE - Hydrologist (Medical): No    Lack of Transportation (Non-Medical): No  Physical Activity: Sufficiently Active (06/22/2021)   Exercise Vital Sign    Days of Exercise per Week: 7 days    Minutes of Exercise per Session: 60 min  Stress: Stress Concern Present (06/22/2021)   [place]    Feeling of Stress : Very much  Social Connections: Moderately Isolated (06/22/2021)   Social Connection and Isolation Panel [NHANES]    Frequency of Communication with Friends and Family: Three times a week    Frequency of Social Gatherings with Friends and Family: Never    Attends Religious Services: Never    Marine scientist or Organizations: No    Attends Archivist Meetings: Never    Marital Status: Married  Human resources officer Violence: Not At Risk (06/22/2021)   Humiliation, Afraid, Rape, and Kick questionnaire    Fear of Current or Ex-Partner: No    Emotionally Abused: No    Physically Abused: No    Sexually Abused: No    Allergies  Allergen Reactions   Codeine Sulfate     REACTION: "makes me high"   Pantoprazole Sodium Swelling and Rash    facial swelling    Current Outpatient Medications  Medication Sig Dispense Refill   acetaminophen (TYLENOL) 500 MG tablet Take 500 mg by mouth every 6 (six) hours as needed for moderate pain.     albuterol (VENTOLIN HFA) 108 (90 Base) MCG/ACT inhaler TAKE 2 PUFFS BY MOUTH EVERY 6 HOURS AS NEEDED FOR WHEEZE OR SHORTNESS OF BREATH 26.8 each 2   amLODipine (NORVASC) 10 MG tablet TAKE 1 TABLET EVERY DAY 90 tablet 3   atorvastatin (LIPITOR) 20 MG tablet TAKE 1 TABLET EVERY DAY  90 tablet 3   famotidine (PEPCID) 20 MG tablet Take 20 mg by mouth daily.      FLUoxetine (PROZAC) 20 MG capsule Take 1 capsule (20 mg total) by mouth daily. 90 capsule 3   furosemide (LASIX) 40 MG tablet TAKE 1 TABLET BY MOUTH TWICE A DAY 180 tablet 1   gabapentin (NEURONTIN) 100 MG capsule TAKE 1 CAPSULE TWICE DAILY 90 capsule 3   lidocaine-prilocaine (EMLA) cream Apply 1 application topically as needed. 30 g 1   magnesium oxide (MAG-OX) 400 (240 Mg) MG tablet TAKE 1 TABLET BY MOUTH EVERY DAY 90  tablet 0   polyvinyl alcohol (LIQUIFILM TEARS) 1.4 % ophthalmic solution Place 1 drop into both eyes as needed for dry eyes.     fluticasone (FLONASE) 50 MCG/ACT nasal spray Place 1 spray into both nostrils daily. 11.1 mL 0   No current facility-administered medications for this visit.    PHYSICAL EXAM:   Vitals:   04/23/22 1045  BP: (!) 144/89  Pulse: 76  Resp: 20  Temp: 98 F (36.7 C)  SpO2: 98%  Weight: 136 lb (61.7 kg)  Height: 5\' 2"  (1.575 m)      Constitutional: chronically ill appearing in no distress. Appears adequately nourished.  Neurologic: CN intact. No focal findings. No sensory loss. Psychiatric: Mood and affect symmetric and appropriate. Eyes: No icterus. No conjunctival pallor. Ears, nose, throat: mucous membranes moist. Midline trachea.  Cardiac: regular rate and rhythm.  Respiratory: unlabored. Abdominal: soft, non-tender, non-distended.  Extremity: No edema. No cyanosis. No pallor.  Skin: No gangrene. No ulceration.  Lymphatic: No Stemmer's sign. No palpable lymphadenopathy.   DATA REVIEW:    Most recent CBC    Latest Ref Rng & Units 04/15/2022    1:35 PM 01/08/2022    3:12 PM 10/10/2021    9:11 AM  CBC  WBC 4.0 - 10.5 K/uL 3.5  4.0  3.4   Hemoglobin 12.0 - 15.0 g/dL 13.4  12.3  11.9   Hematocrit 36.0 - 46.0 % 39.7  37.0  35.1   Platelets 150 - 400 K/uL 211  189  173      Most recent CMP    Latest Ref Rng & Units 04/15/2022    1:35 PM 01/08/2022     3:12 PM 10/10/2021    9:11 AM  CMP  Glucose 70 - 99 mg/dL 83  99  91   BUN 8 - 23 mg/dL 14  32  25   Creatinine 0.44 - 1.00 mg/dL 0.79  0.88  0.97   Sodium 135 - 145 mmol/L 138  141  139   Potassium 3.5 - 5.1 mmol/L 3.5  4.0  3.4   Chloride 98 - 111 mmol/L 98  102  99   CO2 22 - 32 mmol/L 35  35  35   Calcium 8.9 - 10.3 mg/dL 9.8  9.5  9.6   Total Protein 6.5 - 8.1 g/dL 7.0  7.0  6.8   Total Bilirubin 0.3 - 1.2 mg/dL 1.0  0.6  1.0   Alkaline Phos 38 - 126 U/L 120  101  70   AST 15 - 41 U/L 12  15  12    ALT 0 - 44 U/L 7  9  6      Renal function Estimated Creatinine Clearance: 52.5 mL/min (by C-G formula based on SCr of 0.79 mg/dL).  Hemoglobin A1C (%)  Date Value  02/05/2021 5.4   Hgb A1c MFr Bld (%)  Date Value  10/13/2017 5.9 (H)    LDL Chol Calc (NIH)  Date Value Ref Range Status  02/05/2021 104 (H) 0 - 99 mg/dL Final    CT A/P 05/09/21 Personally reviewed.  Extent IV thoracoabdominal aortic aneurysm has slightly enlarged.  It is nearly 5 cm.  Endovascular repair would require 3 or 4 vessel fenestrated EVAR and TEVAR.  Yevonne Aline. Stanford Breed, MD [facility] Phone Number: [phone] 04/23/2022 8:07 PM

## 2022-04-24 ENCOUNTER — Telehealth: Payer: Self-pay | Admitting: *Deleted

## 2022-04-24 NOTE — Telephone Encounter (Signed)
Call from patient c/o of Chest pain over last few days . Pain may last for 15 min then goes away when she sits down and rests.  No chest pains today.  Gets dizzy sometimes as well.  None presently. .  Refuses to go to ER.  Since feels ok now.  Given an appointment for 1:15 PM on tomorrow as husband has to take off work.  Advised to go to the ER if Chest Pain and dizziness return prior to appointment.

## 2022-04-25 ENCOUNTER — Emergency Department (HOSPITAL_COMMUNITY): Payer: Medicare HMO

## 2022-04-25 ENCOUNTER — Emergency Department (HOSPITAL_COMMUNITY)
Admission: EM | Admit: 2022-04-25 | Discharge: 2022-04-25 | Disposition: A | Discharge status: Home / Self Care | Payer: Medicare HMO | Attending: Emergency Medicine | Admitting: Emergency Medicine

## 2022-04-25 ENCOUNTER — Ambulatory Visit (HOSPITAL_COMMUNITY)
Admission: RE | Admit: 2022-04-25 | Discharge: 2022-04-25 | Disposition: A | Discharge status: Home / Self Care | Payer: Medicare HMO | Source: Ambulatory Visit | Attending: Internal Medicine | Admitting: Internal Medicine

## 2022-04-25 ENCOUNTER — Ambulatory Visit (INDEPENDENT_AMBULATORY_CARE_PROVIDER_SITE_OTHER): Payer: Medicare HMO | Admitting: Student

## 2022-04-25 ENCOUNTER — Other Ambulatory Visit: Payer: Self-pay

## 2022-04-25 ENCOUNTER — Encounter (HOSPITAL_COMMUNITY): Payer: Self-pay

## 2022-04-25 VITALS — BP 127/78 | HR 77 | Temp 99.0°F | Ht 62.0 in | Wt 135.0 lb

## 2022-04-25 DIAGNOSIS — I129 Hypertensive chronic kidney disease with stage 1 through stage 4 chronic kidney disease, or unspecified chronic kidney disease: Secondary | ICD-10-CM | POA: Diagnosis not present

## 2022-04-25 DIAGNOSIS — N184 Chronic kidney disease, stage 4 (severe): Secondary | ICD-10-CM | POA: Diagnosis not present

## 2022-04-25 DIAGNOSIS — R0782 Intercostal pain: Secondary | ICD-10-CM

## 2022-04-25 DIAGNOSIS — R519 Headache, unspecified: Secondary | ICD-10-CM | POA: Diagnosis not present

## 2022-04-25 DIAGNOSIS — R079 Chest pain, unspecified: Secondary | ICD-10-CM | POA: Diagnosis not present

## 2022-04-25 DIAGNOSIS — J449 Chronic obstructive pulmonary disease, unspecified: Secondary | ICD-10-CM | POA: Insufficient documentation

## 2022-04-25 DIAGNOSIS — R7989 Other specified abnormal findings of blood chemistry: Secondary | ICD-10-CM | POA: Diagnosis not present

## 2022-04-25 DIAGNOSIS — Z85118 Personal history of other malignant neoplasm of bronchus and lung: Secondary | ICD-10-CM | POA: Diagnosis not present

## 2022-04-25 DIAGNOSIS — J439 Emphysema, unspecified: Secondary | ICD-10-CM | POA: Diagnosis not present

## 2022-04-25 DIAGNOSIS — R0789 Other chest pain: Secondary | ICD-10-CM | POA: Diagnosis not present

## 2022-04-25 DIAGNOSIS — G43909 Migraine, unspecified, not intractable, without status migrainosus: Secondary | ICD-10-CM | POA: Diagnosis not present

## 2022-04-25 DIAGNOSIS — H5702 Anisocoria: Secondary | ICD-10-CM | POA: Diagnosis not present

## 2022-04-25 LAB — CBC WITH DIFFERENTIAL/PLATELET
Abs Immature Granulocytes: 0 10*3/uL (ref 0.00–0.07)
Basophils Absolute: 0 10*3/uL (ref 0.0–0.1)
Basophils Relative: 1 %
Eosinophils Absolute: 0.2 10*3/uL (ref 0.0–0.5)
Eosinophils Relative: 6 %
HCT: 40 % (ref 36.0–46.0)
Hemoglobin: 13.2 g/dL (ref 12.0–15.0)
Immature Granulocytes: 0 %
Lymphocytes Relative: 27 %
Lymphs Abs: 1 10*3/uL (ref 0.7–4.0)
MCH: 31.8 pg (ref 26.0–34.0)
MCHC: 33 g/dL (ref 30.0–36.0)
MCV: 96.4 fL (ref 80.0–100.0)
Monocytes Absolute: 0.5 10*3/uL (ref 0.1–1.0)
Monocytes Relative: 12 %
Neutro Abs: 2 10*3/uL (ref 1.7–7.7)
Neutrophils Relative %: 54 %
Platelets: 218 10*3/uL (ref 150–400)
RBC: 4.15 MIL/uL (ref 3.87–5.11)
RDW: 13.2 % (ref 11.5–15.5)
WBC: 3.8 10*3/uL — ABNORMAL LOW (ref 4.0–10.5)
nRBC: 0 % (ref 0.0–0.2)

## 2022-04-25 LAB — BASIC METABOLIC PANEL
Anion gap: 6 (ref 5–15)
BUN: 11 mg/dL (ref 8–23)
CO2: 32 mmol/L (ref 22–32)
Calcium: 9.2 mg/dL (ref 8.9–10.3)
Chloride: 97 mmol/L — ABNORMAL LOW (ref 98–111)
Creatinine, Ser: 0.72 mg/dL (ref 0.44–1.00)
GFR, Estimated: >60 mL/min (ref >60)
Glucose, Bld: 90 mg/dL (ref 70–99)
Potassium: 3.3 mmol/L — ABNORMAL LOW (ref 3.5–5.1)
Sodium: 135 mmol/L (ref 135–145)

## 2022-04-25 LAB — D-DIMER, QUANTITATIVE: D-Dimer, Quant: 2.51 ug/mL-FEU — ABNORMAL HIGH (ref 0.00–0.50)

## 2022-04-25 LAB — TROPONIN I (HIGH SENSITIVITY): Troponin I (High Sensitivity): 5 ng/L (ref <18)

## 2022-04-25 MED ORDER — ACETAMINOPHEN 500 MG PO TABS
1000.0000 mg | ORAL_TABLET | Freq: Once | ORAL | Status: AC
  Administered 2022-04-25: 1000 mg via ORAL
  Filled 2022-04-25: qty 2

## 2022-04-25 MED ORDER — METOCLOPRAMIDE HCL 5 MG/ML IJ SOLN
10.0000 mg | Freq: Once | INTRAMUSCULAR | Status: AC
  Administered 2022-04-25: 10 mg via INTRAVENOUS
  Filled 2022-04-25: qty 2

## 2022-04-25 MED ORDER — DIPHENHYDRAMINE HCL 50 MG/ML IJ SOLN
12.5000 mg | Freq: Once | INTRAMUSCULAR | Status: AC
  Administered 2022-04-25: 12.5 mg via INTRAVENOUS
  Filled 2022-04-25: qty 1

## 2022-04-25 MED ORDER — GADOBUTROL 1 MMOL/ML IV SOLN
8.5000 mL | Freq: Once | INTRAVENOUS | Status: AC | PRN
  Administered 2022-04-25: 8.5 mL via INTRAVENOUS

## 2022-04-25 MED ORDER — IOHEXOL 350 MG/ML SOLN
75.0000 mL | Freq: Once | INTRAVENOUS | Status: AC | PRN
  Administered 2022-04-25: 75 mL via INTRAVENOUS

## 2022-04-25 NOTE — Progress Notes (Signed)
Pt to ED via w/c per Dr Cain Sieve.

## 2022-04-25 NOTE — Assessment & Plan Note (Addendum)
Patient reports a headache that started a couple weeks ago.  She describes it as a sharp, throbbing, achy, and pressure-like sensation that will often travel from her upper chest up through her neck to the ears. Located on both sides of her head, front and back. Worse in the morning and usually resolves within 30-60min, though has recently started to persist throughout the day. These headaches are also associated with photophobia and spots in her visual field. Reports positional lightheadedness. She has a history of migraines and states that these headaches are similar in quality but worse and, additionally, she has never seen spots before. Over the last several weeks, the patient reports particularly poor appetite and limited water intake, preferring instead to drink either coffee or soda. Physical exam notable for anisocoria L>R and slightly decreased sensation across the entire right side of her body. Muscle stiffness present throughout her upper chest and neck. Neurologic exam including extremity strength and CN II-IV + VI-VII +IX-XII function were intact. These headaches seem to have mixed features of both migrainous and tension subtypes. Etiology remains unclear, though most likely combination of poor nutrition and hydration. Given her history of lung cancer and exam findings of anisocoria and right-sided sensation loss, further diagnostic workup is warranted to rule out bleed secondary to brain metastasis.  - Direct patient to emergency department for further workup including stat head CT scan - Encourage consuming high calorie foods and more water relative to other drinks - Trial acetaminophen 500mg  q6, switch to ibuprofen 400mg  q8 if ineffective

## 2022-04-25 NOTE — ED Triage Notes (Signed)
Pt c/o HA that has lasted all day and was sent here by her PCP to get a CT of the head. Pt states HA started at 0800 today. Pt states has not taken OTC meds for HA. Pt states she has dizziness and vision changes w/HA at times. Pt states ahd a HA yesterday, but it went away.

## 2022-04-25 NOTE — ED Provider Notes (Signed)
[facility] Provider Note   CSN: [id number] Arrival date & time: 04/25/22  1454     History  Chief Complaint  Patient presents with   Headache    Debra Rodgers is a 77 y.o. 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  She was directed to the emergency department from the internal medicine clinic today.  She has had worsening headaches going on for about a week now.  This morning, she woke up at approximately 0800 with a severe headache that spans all over her head.  She says that it feels like a throbbing sensation with some pressure behind her eyes as well.  It is associated with photosensitivity and photosensitivity.  She has not tried anything to improve the pain so far today.  She does not have any nausea or vomiting.  She does endorse some lightheadedness, but states that this is more chronic.  She also has blurry vision, but states that this also is not acute and that she is being worked up for cataracts.  She endorses some neurological numbness on the right side of her face. She denies other acute focal neurological deficits, loss of consciousness, vision loss. She has not had any recent head trauma, new medication changes. She does have headaches occasionally which typically improve with OTC meds but states that her headache today is more severe. Of note, she is also diagnosed with OSA and has not been CPAP compliant this week.  She additionally endorses intermittent nonexertional sharp left sided chest pain which is typically mild and not associated with dyspnea, n/v, or radiation. She is not overly concerned about chest pain today and is not currently having chest pain. EKG was done in clinic this morning and appeared similar to lasst EKG on record.  She was referred to ED from clinic for STAT head imaging  because of severe headache, history of currently untreated metastatic cancer, and dizziness/vision symptoms with anisocoria on exam and also right-sided facial numbness due to concern for neoplastic intracranial process.  Home Medications Prior to Admission medications   Medication Sig Start Date End Date Taking? Authorizing Provider  acetaminophen (TYLENOL) 500 MG tablet Take 500 mg by mouth every 6 (six) hours as needed for moderate pain.    [provider]  albuterol (VENTOLIN HFA) 108 (90 Base) MCG/ACT inhaler TAKE 2 PUFFS BY MOUTH EVERY 6 HOURS AS NEEDED FOR WHEEZE OR SHORTNESS OF BREATH 08/09/21   [name], MD  amLODipine (NORVASC) 10 MG tablet TAKE 1 TABLET EVERY DAY 10/29/21   [name], MD  atorvastatin (LIPITOR) 20 MG tablet TAKE 1 TABLET EVERY DAY 05/01/21   [name], MD  famotidine (PEPCID) 20 MG tablet Take 20 mg by mouth daily.     [provider]  FLUoxetine (PROZAC) 20 MG capsule Take 1 capsule (20 mg total) by mouth daily. 09/10/21   [name], MD  fluticasone (FLONASE) 50 MCG/ACT nasal spray Place 1 spray into both nostrils daily. 10/18/19 11/17/19  Oretha Milch D, MD  furosemide (LASIX) 40 MG tablet TAKE 1 TABLET BY MOUTH TWICE A DAY 12/31/21   [name], MD  gabapentin (NEURONTIN) 100 MG capsule TAKE 1 CAPSULE TWICE DAILY 04/10/22   Heilingoetter, Cassandra L, PA-C  lidocaine-prilocaine (EMLA) cream Apply 1 application topically as needed. 03/21/21   Dede Query  T, PA-C  magnesium oxide (MAG-OX) 400 (240 Mg) MG tablet TAKE 1 TABLET BY MOUTH EVERY DAY 06/12/21   Heilingoetter, Cassandra L, PA-C  polyvinyl alcohol (LIQUIFILM TEARS) 1.4 % ophthalmic solution Place 1 drop into both eyes as needed for dry eyes.    [provider]      Allergies    Codeine sulfate and Pantoprazole sodium    Review of Systems   See HPI  Physical Exam Updated Vital Signs BP 130/82 (BP Location: Right Arm)    Pulse 66   Temp 98.7 F (37.1 C) (Oral)   Resp 19   Ht 5\' 2"  (1.575 m)   Wt 61.2 kg   SpO2 97%   BMI 24.68 kg/m  Physical Exam Constitutional:      General: She is not in acute distress. HENT:     Head: Normocephalic and atraumatic.  Eyes:     General: No visual field deficit or scleral icterus.    Extraocular Movements: Extraocular movements intact.     Right eye: No nystagmus.     Left eye: No nystagmus.     Pupils: Pupils are unequal (aniscocoria with L pupil 36mm and R pupil 2 mm).  Cardiovascular:     Rate and Rhythm: Normal rate and regular rhythm.     Heart sounds: Normal heart sounds. No murmur heard.    No friction rub. No gallop.  Pulmonary:     Effort: Pulmonary effort is normal. No respiratory distress.     Breath sounds: Normal breath sounds. No wheezing.  Musculoskeletal:        General: Normal range of motion.     Cervical back: Normal range of motion. No rigidity.  Skin:    General: Skin is warm and dry.  Neurological:     Mental Status: She is alert and oriented to person, place, and time.     Cranial Nerves: No cranial nerve deficit, dysarthria or facial asymmetry.     Sensory: Sensory deficit (numbness on R side of face compared to L) present.     Motor: No weakness.  Psychiatric:        Mood and Affect: Mood normal.        Speech: Speech normal.        Behavior: Behavior normal.     ED Results / Procedures / Treatments   Labs (all labs ordered are listed, but only abnormal results are displayed) Labs Reviewed  BASIC METABOLIC PANEL - Abnormal; Notable for the following components:      Result Value   Potassium 3.3 (*)    Chloride 97 (*)    All other components within normal limits  CBC WITH DIFFERENTIAL/PLATELET - Abnormal; Notable for the following components:   WBC 3.8 (*)    All other components within normal limits  D-DIMER, QUANTITATIVE - Abnormal; Notable for the following components:   D-Dimer, Quant 2.51 (*)    All other components  within normal limits  URINALYSIS, ROUTINE W REFLEX MICROSCOPIC  TROPONIN I (HIGH SENSITIVITY)  TROPONIN I (HIGH SENSITIVITY)    EKG None  Radiology CT Angio Chest PE W and/or Wo Contrast  Result Date: 04/25/2022 CLINICAL DATA:  Positive D-dimer.  Non-small cell lung cancer. EXAM: CT ANGIOGRAPHY CHEST WITH CONTRAST TECHNIQUE: Multidetector CT imaging of the chest was performed using the standard protocol during bolus administration of intravenous contrast. Multiplanar CT image reconstructions and MIPs were obtained to evaluate the vascular anatomy. RADIATION DOSE REDUCTION: This exam was performed according to the  departmental dose-optimization program which includes automated exposure control, adjustment of the mA and/or kV according to patient size and/or use of iterative reconstruction technique. CONTRAST:  107mL OMNIPAQUE IOHEXOL 350 MG/ML SOLN COMPARISON:  CT chest abdomen and pelvis 04/10/2022 FINDINGS: Cardiovascular: There is mild aneurysmal dilatation of the ascending aorta measuring 4.1 cm, unchanged. Heart is mildly enlarged. Descending thoracic aorta is dilated measuring up to 4.7 cm, unchanged. There are atherosclerotic calcifications throughout the aorta. Right chest port is present with distal catheter tip ending in the distal SVC. There is adequate opacification of the pulmonary arteries. There is no evidence for pulmonary embolism. The main pulmonary artery is enlarged compatible with pulmonary artery hypertension. Mediastinum/Nodes: No enlarged mediastinal, hilar, or axillary lymph nodes. Thyroid gland, trachea, and esophagus demonstrate no significant findings. Lungs/Pleura: Moderate emphysematous changes are present predominantly in the upper lungs. There is stable elevation of the left hemidiaphragm. There is no focal lung infiltrate, pleural effusion or pneumothorax. Upper Abdomen: No acute abnormality. Musculoskeletal: Cervical spinal fusion hardware present. No acute fractures are  seen. Review of the MIP images confirms the above findings. IMPRESSION: 1. No evidence for pulmonary embolism or other acute cardiopulmonary process. 2. 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 3. Stable enlargement of the main pulmonary artery compatible with pulmonary artery hypertension. Aortic Atherosclerosis (ICD10-I70.0) and Emphysema (ICD10-J43.9). Electronically Signed   By: Ronney Asters M.D.   On: 04/25/2022 23:00   MR Brain W and Wo Contrast  Result Date: 04/25/2022 CLINICAL DATA:  Headache EXAM: MRI HEAD WITHOUT AND WITH CONTRAST TECHNIQUE: Multiplanar, multiecho pulse sequences of the brain and surrounding structures were obtained without and with intravenous contrast. CONTRAST:  8.16mL GADAVIST GADOBUTROL 1 MMOL/ML IV SOLN COMPARISON:  12/15/2020 FINDINGS: Brain: No acute infarct, mass effect or extra-axial collection. No chronic microhemorrhage or siderosis. There is multifocal hyperintense T2-weighted signal within the white matter. Parenchymal volume and CSF spaces are normal. The midline structures are normal. There is no abnormal contrast enhancement. Vascular: Normal flow voids. Skull and upper cervical spine: Normal marrow signal. Sinuses/Orbits: Left maxillary and ethmoid mucosal thickening Other: None. IMPRESSION: 1. No acute intracranial abnormality. 2. Findings of chronic microvascular ischemia. 3. No intracranial metastatic disease. Electronically Signed   By: Ulyses Jarred M.D.   On: 04/25/2022 21:00   CT Head Wo Contrast  Result Date: 04/25/2022 CLINICAL DATA:  Headache EXAM: CT HEAD WITHOUT CONTRAST TECHNIQUE: Contiguous axial images were obtained from the base of the skull through the vertex without intravenous contrast. RADIATION DOSE REDUCTION: This exam was performed according to the departmental dose-optimization program which includes automated exposure control, adjustment of the mA and/or kV according to patient size and/or use of iterative  reconstruction technique. COMPARISON:  CT Head 05/21/13 FINDINGS: Brain: No evidence of acute infarction, hemorrhage, hydrocephalus, extra-axial collection or mass lesion/mass effect. Enlarged and partially empty sella. Unchanged likely small arachnoid cyst in the middle cranial fossa on the left. Vascular: No hyperdense vessel or unexpected calcification. Skull: Normal. Negative for fracture or focal lesion. Sinuses/Orbits: No mastoid or middle ear effusion. Impacted cerumen in the left EAC. Degenerative changes of the bilateral TMJs. Mucosal thickening and air-fluid level in the left maxillary sinus. Mucosal thickening left sided ethmoid air cells. Bilateral orbits are unremarkable. Other: None. IMPRESSION: 1. No acute intracranial abnormality. 2. Mucosal thickening and air-fluid level in the left maxillary sinus. Correlate for signs/symptoms of acute sinusitis. 3. Enlarged and partially empty sella, which can be seen in the setting of idiopathic intracranial  hypertension. Electronically Signed   By: Marin Roberts M.D.   On: 04/25/2022 16:35    Medications Ordered in ED Medications  metoCLOPramide (REGLAN) injection 10 mg (10 mg Intravenous Given 04/25/22 1727)  acetaminophen (TYLENOL) tablet 1,000 mg (1,000 mg Oral Given 04/25/22 1720)  diphenhydrAMINE (BENADRYL) injection 12.5 mg (12.5 mg Intravenous Given 04/25/22 1727)  gadobutrol (GADAVIST) 1 MMOL/ML injection 8.5 mL (8.5 mLs Intravenous Contrast Given 04/25/22 2011)  iohexol (OMNIPAQUE) 350 MG/ML injection 75 mL (75 mLs Intravenous Contrast Given 04/25/22 2252)    ED Course/ Medical Decision Making/ A&P   {                          Medical Decision Making Amount and/or Complexity of Data Reviewed Labs: ordered. Radiology: ordered.  Risk OTC drugs. Prescription drug management.   Debra Rodgers is a 77 y.o. 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  She was directed to the emergency department from the internal medicine clinic today.  She has persistent headache after presentation to the ED.  Exam is consistent with that in clinic earlier today with anisocoria (left slightly greater than right) and right facial numbness.  CT head and MRI obtained which are negative for mets, dural venous venous thrombosis, acute infarct, intracranial hemorrhage.  CT did note possible partially empty sella, but was confirmed normal and MRI after discussion with Dr. Collins [place].  She did endorse intermittent chest pain and dyspnea with possible initial hypoxia on first set of vitals.  D-dimer was obtained given history of untreated cancer and was elevated.  CTA PE study was negative.  Initial troponins and EKG are negative for ACS.  She feels significantly better after migraine cocktail without any changes in her neurological exam.  Final DDx with possibly migraine versus OSA related headaches.  She can be discharged at this time and follow-up with PCP.   Final Clinical Impression(s) / ED Diagnoses Final diagnoses:  Migraine without status migrainosus, not intractable, unspecified migraine type    Rx / DC Orders ED Discharge Orders     None         Linus Galas, MD 04/25/22 4235    Gareth Morgan, MD 04/30/22 1436

## 2022-04-25 NOTE — ED Notes (Signed)
Pt taken to CT.

## 2022-04-25 NOTE — Progress Notes (Signed)
77yo woman with metastatic NSCLC (no known brain meds) who presented to clinic with a headache and chest pain.   Her chest pain seems musculoskeletal. It is sharp, intermittent, not occurring with exertion or stress. No dyspnea, pain is not pleuritic. EKG without acute ischemic changes.   I am more worried about her headaches. Pain is located in the bilateral temples, top of head, and back of head. Pain typically last <1 hour, and going to a dark room and resting reliefs the pain. Tylenol is also helpful for pain. On her neuro exam, she has slight anisocoria - L pupil is 3cm, R is 2cm, both are reactive to light. She feels diminished sensation all across the right side of her body - feels tough L > R on her face, arms, and legs.  Based on her history, I thought headaches seemed more like tension headaches or perhaps migraines, brought on by dehydration and stress. However, with his history of metastatic cancer and abnormal neuro exam today, we are sending her to the ER for urgent head imaging, as a bleeding met is also on the ddx.  Please get stat CT head on arrival to ER.   I gave signout to ER.

## 2022-04-25 NOTE — ED Provider Triage Note (Signed)
Emergency Medicine Provider Triage Evaluation Note  Debra Rodgers , a 77 y.o. female  was evaluated in triage.  Pt complains of generalized headache onset today.  Notes that she has had these headaches before however they never last this long.  This headache has been ongoing since she woke up this morning.  Was evaluated at her primary care provider office.  They told her to come to the emergency department for evaluation of her symptoms.  She has not tried any medications for symptoms.  Denies history of stroke.  No anticoagulant use at this time.  Denies chest pain, shortness of breath, abdominal pain, nausea, vomiting, numbness, tingling, weakness, vision changes.  Review of Systems  Positive:  Negative:   Physical Exam  BP (!) 148/85   Pulse 80   Temp 98.8 F (37.1 C) (Oral)   Resp (!) 22   SpO2 (!) 88%  Gen:   Awake, no distress   Resp:  Normal effort  MSK:   Moves extremities without difficulty  Other:  Grip strength 5/5 bilaterally.  Negative pronator drift.  Strength sensation intact to bilateral upper and lower extremities.  No focal neurological deficit.  Able to ambulate without assistance or difficulty.  Medical Decision Making  Medically screening exam initiated at 3:33 PM.  Appropriate orders placed.  MARCEL SORTER was informed that the remainder of the evaluation will be completed by another provider, this initial triage assessment does not replace that evaluation, and the importance of remaining in the ED until their evaluation is complete.  Workup initiated   Vina Byrd A, PA-C 04/25/22 1534

## 2022-04-25 NOTE — ED Notes (Signed)
Pt transported to MRI 

## 2022-04-25 NOTE — Assessment & Plan Note (Addendum)
Patient reports sharp, intermittent, self-resolving left-sided chest pain that started about one week ago and occurs about every other day. Each episode lasts a few minutes, most recent one was yesterday. Denies palpitations, diaphoresis, increased breath shortness, anxiety, and any association with her headaches. The pain is localized to her anterior chest wall and does not radiate or worsen with postural changes, deep breathing, or exercise. Vital signs are unremarkable, no tachycardia, tachypnea, or fever and patient is breathing comfortably on room air. Exam notable for arrhythmia. Electrocardiogram demonstrated normal sinus rhythm. History, exam, and ECG are reassuring. Low concern for ACS, aortic dissection, PE, pericarditis, and pneumothorax at this time. Etiology most likely musculoskeletal given its location and quality.  - Encourage home upper body stretching exercises and deep breathing techniques - Trial acetaminophen 500mg  q6, switch to ibuprofen 400mg  q8 if ineffective

## 2022-04-25 NOTE — Progress Notes (Addendum)
CC: Headache  HPI:   Ms.Gustava Lemmie Evens Peale is a 77 y.o. female with a past medical history of hypertension, tobacco use, COPD, AAA, end-stage lung cancer, depression, and migraines who presents with headache and chest pain. She was last seen at [facility] in 08-2021.    Past Medical History:  Diagnosis Date   AAA (abdominal aortic aneurysm) (HCC)    COPD (chronic obstructive pulmonary disease) (HCC)    Depression    DVT (deep venous thrombosis) ([place]) 07/12/2019   Encounter for antineoplastic chemotherapy 12/02/2018   Encounter for antineoplastic immunotherapy 12/02/2018   Essential hypertension    Goals of care, counseling/discussion 12/02/2018   MOST form completed 07/12/19:  DNR/DNI but full scope of usual care. Available in [facility]   Headache    History of migraine headaches    lung ca dx'd 10/2018   Sleep apnea    Tobacco use disorder      Review of Systems:    Reports headache, chest pain Denies fever, chills, nausea, sweats   Physical Exam:  Vitals:   04/25/22 1318  BP: 127/78  Pulse: 77  Temp: 99 F (37.2 C)  TempSrc: Oral  SpO2: 98%  Weight: 135 lb (61.2 kg)  Height: 5\' 2"  (1.575 m)    General:   awake and alert, sitting comfortably in chair, cooperative, not in acute distress Skin:   warm and dry, hard subdermal structure over right chest consistent with chemotherapy port, no rashes Head:   normocephalic and atraumatic, oral mucosa moist with good dentition Eyes:   extraocular movements intact, pupils round and reactive to light but L>R, no periorbital swelling or scleral icterus Lungs:   normal respiratory effort, breathing unlabored, symmetrical chest rise, mild expiratory wheezing greater in upper fields bilaterally Cardiac:   regular rate with irregular rhythm, normal S1 and S2 Musculoskeletal:   motor strength 5 /5 in all four extremities Neurologic:   oriented to person-place-time, moving all extremities, sensation to light touch slightly diminished on right side of  body, CN II-IV + VI-VII +IX-XII intact, anisocoria L>R Psychiatric:   euthymic mood with congruent affect, intelligible speech    Assessment & Plan:   Headache Patient reports a headache that started a couple weeks ago.  She describes it as a sharp, throbbing, achy, and pressure-like sensation that will often travel from her upper chest up through her neck to the ears. Located on both sides of her head, front and back. Worse in the morning and usually resolves within 30-60min, though has recently started to persist throughout the day. These headaches are also associated with photophobia and spots in her visual field. Reports positional lightheadedness. She has a history of migraines and states that these headaches are similar in quality but worse and, additionally, she has never seen spots before. Over the last several weeks, the patient reports particularly poor appetite and limited water intake, preferring instead to drink either coffee or soda. Physical exam notable for anisocoria L>R and slightly decreased sensation across the entire right side of her body. Muscle stiffness present throughout her upper chest and neck. Neurologic exam including extremity strength and CN II-IV + VI-VII +IX-XII function were intact. These headaches seem to have mixed features of both migrainous and tension subtypes. Etiology remains unclear, though most likely combination of poor nutrition and hydration. Given her history of lung cancer and exam findings of anisocoria and right-sided sensation loss, further diagnostic workup is warranted to rule out bleed secondary to brain metastasis.  - Direct patient  to emergency department for further workup including stat head CT scan - Encourage consuming high calorie foods and more water relative to other drinks - Trial acetaminophen 500mg  q6, switch to ibuprofen 400mg  q8 if ineffective    Chest pain Patient reports sharp, intermittent, self-resolving left-sided chest pain  that started about one week ago and occurs about every other day. Each episode lasts a few minutes, most recent one was yesterday. Denies palpitations, diaphoresis, increased breath shortness, anxiety, and any association with her headaches. The pain is localized to her anterior chest wall and does not radiate or worsen with postural changes, deep breathing, or exercise. Vital signs are unremarkable, no tachycardia, tachypnea, or fever and patient is breathing comfortably on room air. Exam notable for arrhythmia. Electrocardiogram demonstrated normal sinus rhythm. History, exam, and ECG are reassuring. Low concern for ACS, aortic dissection, PE, pericarditis, and pneumothorax at this time. Etiology most likely musculoskeletal given its location and quality.  - Encourage home upper body stretching exercises and deep breathing techniques - Trial acetaminophen 500mg  q6, switch to ibuprofen 400mg  q8 if ineffective      See Encounters Tab for problem based charting.  Patient seen with Dr.  Cain Sieve

## 2022-04-25 NOTE — Patient Instructions (Signed)
  Thank you, [name], for allowing [place] to provide your care today. Today we discussed . . .  > Headache       - we believe that your headaches are caused by poor nutrition and water intake, please eat more high-calorie foods and try to drink more water than either coffee or soda       - you can also try acetaminophen or ibuprofen to reduce the severity of your headaches       - we are sending you to the emergency department for brain imaging to make sure that you do not have any tumors > Chest Pain       - your examination and ECG testing was reassuring       - your chest pain is most likely caused by irritation or spasm of a muscle       - you can take ibuprofen or acetaminophen to reduce this pain the next time that it occurs   I have ordered the following labs for you:  Lab Orders  No laboratory test(s) ordered today      Tests ordered today:  Head CT per ED   Referrals ordered today:   Referral Orders  No referral(s) requested today      I have ordered the following medication/changed the following medications:   Stop the following medications: There are no discontinued medications.   Start the following medications: No orders of the defined types were placed in this encounter.     Follow up:  1 month     Remember:  Please make sure that you are eating enough food and drinking enough water. You can try acetaminophen and ibuprofen for both your headache and chest pain. We will see you again in about one month.   Should you have any questions or concerns please call the internal medicine clinic at [phone].     Roswell Nickel, MD [place]

## 2022-04-25 NOTE — Discharge Instructions (Addendum)
If you have recurrent severe headaches or new numbness or weakness anywhere in your body, please come back to the ED to be evaluated.  Otherwise she can follow-up with your primary care provider and possibly see a doctor in a headache clinic if you are headaches become more common.

## 2022-04-25 NOTE — ED Notes (Signed)
EKG completed

## 2022-04-26 NOTE — Progress Notes (Signed)
Internal Medicine Clinic Attending  I saw and evaluated the patient.  I personally confirmed the key portions of the history and exam documented by Dr.  Jodi Mourning  and I reviewed pertinent patient test results.  The assessment, diagnosis, and plan were formulated together and I agree with the documentation in the resident's note.     Patient was evaluated in the ER, and had a normal CT head and MRI brain with no evidence of brain metastasis, bleeding, or CVA.  She also received a CTA chest, which showed no pulmonary embolism.  ER doc thinks headache was most consistent with a migraine, and I agree with this assessment.   She has follow up with her PCP in 1 month scheduled.

## 2022-04-29 ENCOUNTER — Telehealth: Payer: Self-pay

## 2022-04-29 NOTE — Patient Outreach (Signed)
  Care Coordination TOC Note Transition Care Management Follow-up Telephone Call Date of discharge and from where: [facility] 04/25/22- EMMI call How have you been since you were released from the hospital? "I still have a headache and now I have a cough" Any questions or concerns? No  Items Reviewed: Did the pt receive and understand the discharge instructions provided? Yes  Medications obtained and verified? Yes  Other? No  Any new allergies since your discharge? No  Dietary orders reviewed? No Do you have support at home? Yes   Home Care and Equipment/Supplies: Were home health services ordered? not applicable If so, what is the name of the agency? N/a  Has the agency set up a time to come to the patient's home? not applicable Were any new equipment or medical supplies ordered?  No What is the name of the medical supply agency? N/a Were you able to get the supplies/equipment? not applicable Do you have any questions related to the use of the equipment or supplies? No  Functional Questionnaire: (I = Independent and D = Dependent) ADLs: I  Bathing/Dressing- I  Meal Prep- I  Eating- I  Maintaining continence- I  Transferring/Ambulation- I  Managing Meds- I  Follow up appointments reviewed:  PCP Hospital f/u appt confirmed? No   Specialist Hospital f/u appt confirmed? No   Are transportation arrangements needed? No  If their condition worsens, is the pt aware to call PCP or go to the Emergency Dept.? Yes Was the patient provided with contact information for the PCP's office or ED? Yes Was to pt encouraged to call back with questions or concerns? Yes  SDOH assessments and interventions completed:   Yes SDOH Interventions Today    Flowsheet Row Most Recent Value  SDOH Interventions   Food Insecurity Interventions Intervention Not Indicated  Housing Interventions Intervention Not Indicated       Care Coordination Interventions:  PCP follow up appointment  requested   Encounter Outcome:  Pt. Visit Completed

## 2022-04-30 NOTE — Progress Notes (Signed)
CC: ED Fu  HPI:   [name] is a 77 y.o. female with a past medical history of hypertension, tobacco use, COPD, AAA, end-stage lung cancer, depression, and migraines who presents for ED follow-up. She was last seen at [facility] on 04-25-2022 for an intractable headache where neurological exam revealed subtle hemihypesthesia plus anisocoria concerning of possible intracranial hemorrhage secondary to brain metastases. Subsequent imaging performed in the ED was negative for metastasis, venous thrombosis, acute infarct, and hemorrhage. Presentation was attributed to a migrainous headache and symptoms resolved after administration of acetaminophen-metoclopramide-diphenhydramine cocktail.     Past Medical History:  Diagnosis Date   AAA (abdominal aortic aneurysm) (HCC)    COPD (chronic obstructive pulmonary disease) (HCC)    Depression    DVT (deep venous thrombosis) ([place]) 07/12/2019   Encounter for antineoplastic chemotherapy 12/02/2018   Encounter for antineoplastic immunotherapy 12/02/2018   Essential hypertension    Goals of care, counseling/discussion 12/02/2018   MOST form completed 07/12/19:  DNR/DNI but full scope of usual care. Available in [facility]   Headache    History of migraine headaches    lung ca dx'd 10/2018   Sleep apnea    Tobacco use disorder      Review of Systems:    Reports headache, occasional chest pain Denies fever, chills, weakness, bowel or bladder changes, nausea, vomiting    Physical Exam:  Vitals:   05/03/22 1005 05/03/22 1011  BP: (!) 140/84 (!) 158/89  Pulse: 78 79  Resp: (!) 28   Temp: 99.2 F (37.3 C)   TempSrc: Oral   SpO2: 96%   Weight: 135 lb 6.4 oz (61.4 kg)   Height: 5\' 2"  (1.575 m)     General:   awake and alert, sitting comfortably in chair, cooperative, not in acute distress Skin:   warm and dry, intact without any obvious lesions or scars, no rashes Eyes:   extraocular movements intact Lungs:   normal respiratory effort, breathing  unlabored, symmetrical chest rise, mild expiratory wheezing greater in upper fields bilaterally  Cardiac:   regular rate with irregular rhythm, normal S1 and S2 Musculoskeletal:   motor strength 5 /5 in all four extremities Neurologic:   oriented to person-place-time, moving all extremities, sensation to light touch slightly diminished on right side of body, CN II-IV + VI-VII intact Psychiatric:   euthymic mood with congruent affect, intelligible speech    Assessment & Plan:   Headache Patient continues to experience a sharp, throbby, achy, pressure-like headache that involves both sides of her head. These headaches have both migrainous and tension features. Denies nausea and vomiting. Given lung cancer and abnormal neurological exam, specifically right-sided hemihypesthesia and anisocoria, diagnostic workup with brain imaging was performed at her last visit. All findings were unremarkable and patient was encouraged to try analgesics. She has been using acetaminophen at night, which improves the headache and allows her to sleep. Additionally, she has been drinking more water. Discussed plan to trial ibuprofen specifically and encouraged keeping headache log, which will help [place] identify the underlying cause.  - Encourage trial of ibuprofen as replacement for acetaminophen - Recommend keeping headache symptom log and bringing it to next visit    Chest pain Patient initially reported sharp, intermittent, self-resolving left-sided chest pain at her last visit on 04-25-2022. Electrocardiogram at that time demonstrated sinus tachycardia. Subsequent workup in the ED was negative for ACS, aortic dissection, PE, and pneumothroax. Etiology most likely musculoskeletal. Since her last visit, the chest pain has improved  and is occurring less frequently now.   - Encourage home upper body stretching exercises and deep breathing techniques    Essential hypertension Patient has history of hypertension managed  with amlodipine and furosemide. She takes these medications regularly, but skipped them this morning. Today in clinic, BP 140/84.  - Continue amlodipine 10mg  q24 and furosemide 40mg  q12      See Encounters Tab for problem based charting.  Patient discussed with Dr.  Cain Sieve

## 2022-05-03 ENCOUNTER — Other Ambulatory Visit: Payer: Self-pay

## 2022-05-03 ENCOUNTER — Encounter: Payer: Self-pay | Admitting: Student

## 2022-05-03 ENCOUNTER — Ambulatory Visit (INDEPENDENT_AMBULATORY_CARE_PROVIDER_SITE_OTHER): Payer: Medicare HMO | Admitting: Student

## 2022-05-03 VITALS — BP 158/89 | HR 79 | Temp 99.2°F | Resp 28 | Ht 62.0 in | Wt 135.4 lb

## 2022-05-03 DIAGNOSIS — I10 Essential (primary) hypertension: Secondary | ICD-10-CM

## 2022-05-03 DIAGNOSIS — R0782 Intercostal pain: Secondary | ICD-10-CM

## 2022-05-03 DIAGNOSIS — Z87891 Personal history of nicotine dependence: Secondary | ICD-10-CM

## 2022-05-03 DIAGNOSIS — R519 Headache, unspecified: Secondary | ICD-10-CM

## 2022-05-03 NOTE — Assessment & Plan Note (Signed)
Patient initially reported sharp, intermittent, self-resolving left-sided chest pain at her last visit on 04-25-2022. Electrocardiogram at that time demonstrated sinus tachycardia. Subsequent workup in the ED was negative for ACS, aortic dissection, PE, and pneumothroax. Etiology most likely musculoskeletal. Since her last visit, the chest pain has improved and is occurring less frequently now.   - Encourage home upper body stretching exercises and deep breathing techniques

## 2022-05-03 NOTE — Patient Instructions (Signed)
  Thank you, [name], for allowing [place] to provide your care today. Today we discussed . . .  > Headache       - instead of acetaminophen, we are recommending that you try ibuprofen, which may provide more relief        - additionally, please start keeping a log of your headaches including when they are particularly bad, which medications help, and what makes them worse       - bring this headache log with you to your next appointment > Hypertension       - continue to take your blood pressure medications       - start checking your blood pressure at home and bring a log to your next visit   I have ordered the following labs for you:  Lab Orders  No laboratory test(s) ordered today      Tests ordered today:  none   Referrals ordered today:   Referral Orders  No referral(s) requested today      I have ordered the following medication/changed the following medications:   Stop the following medications: There are no discontinued medications.   Start the following medications: No orders of the defined types were placed in this encounter.     Follow up:  1 month     Remember:  Please try ibuprofen instead of acetaminophen, this may provide better headache relief. Also ensure that you are drinking plenty of water. Start tracking the timing and intensity of your headaches. Bring this log with you to your next visit in about one month!   Should you have any questions or concerns please call the internal medicine clinic at [phone].     Debra Nickel, MD [place]

## 2022-05-03 NOTE — Assessment & Plan Note (Signed)
Patient has history of hypertension managed with amlodipine and furosemide. She takes these medications regularly, but skipped them this morning. Today in clinic, BP 140/84.  - Continue amlodipine 10mg  q24 and furosemide 40mg  q12

## 2022-05-03 NOTE — Assessment & Plan Note (Signed)
Patient continues to experience a sharp, throbby, achy, pressure-like headache that involves both sides of her head. These headaches have both migrainous and tension features. Denies nausea and vomiting. Given lung cancer and abnormal neurological exam, specifically right-sided hemihypesthesia and anisocoria, diagnostic workup with brain imaging was performed at her last visit. All findings were unremarkable and patient was encouraged to try analgesics. She has been using acetaminophen at night, which improves the headache and allows her to sleep. Additionally, she has been drinking more water. Discussed plan to trial ibuprofen specifically and encouraged keeping headache log, which will help [place] identify the underlying cause.  - Encourage trial of ibuprofen as replacement for acetaminophen - Recommend keeping headache symptom log and bringing it to next visit

## 2022-05-10 NOTE — Progress Notes (Signed)
Internal Medicine Clinic Attending  Case discussed with Dr. Jodi Mourning  At the time of the visit.  We reviewed the resident's history and exam and pertinent patient test results.  I agree with the assessment, diagnosis, and plan of care documented in the resident's note.

## 2022-05-30 DIAGNOSIS — I716 Thoracoabdominal aortic aneurysm, without rupture, unspecified: Secondary | ICD-10-CM | POA: Diagnosis not present

## 2022-06-03 ENCOUNTER — Ambulatory Visit (INDEPENDENT_AMBULATORY_CARE_PROVIDER_SITE_OTHER): Payer: Medicare HMO | Admitting: Student in an Organized Health Care Education/Training Program

## 2022-06-03 VITALS — BP 146/93 | HR 64 | Temp 98.1°F | Ht 62.0 in | Wt 137.5 lb

## 2022-06-03 DIAGNOSIS — I10 Essential (primary) hypertension: Secondary | ICD-10-CM

## 2022-06-03 DIAGNOSIS — R519 Headache, unspecified: Secondary | ICD-10-CM | POA: Diagnosis not present

## 2022-06-03 DIAGNOSIS — I714 Abdominal aortic aneurysm, without rupture, unspecified: Secondary | ICD-10-CM | POA: Diagnosis not present

## 2022-06-03 MED ORDER — CHLORTHALIDONE 25 MG PO TABS: 25.0000 mg | ORAL_TABLET | Freq: Every day | ORAL | 1 refills | Status: DC

## 2022-06-03 MED ORDER — KETOROLAC TROMETHAMINE 30 MG/ML IJ SOLN
30.0000 mg | Freq: Once | INTRAMUSCULAR | Status: AC
  Administered 2022-06-03: 30 mg via INTRAMUSCULAR

## 2022-06-03 NOTE — Progress Notes (Addendum)
Subjective:   Patient ID: Debra Rodgers female   DOB: 27-Nov-1945 77 y.o.   MRN: [id number]  HPI: Debra Rodgers is a 77 y.o. female with a history of HTN, COPD, AAA, stage IV lung cancer, and migraines who presents for follow up.  Patient complaining of continued bitemporal headache for the last several months. States the pain is constant. Denies any associated nausea, vomiting. Does occasionally have some bilateral blurry vision with the pain. States the pain is alleviated with naps and ibuprofen, although she does not take the ibuprofen everyday as wants to "see if the pain goes away on its own." She was worked up for the headaches on 02/01 where imaging in the ED was negative for metastasis, venous thrombosis, acute infarct, and hemorrhage.    Pt also notes continued elevated blood pressure at home. States she is compliant on her amlodipine and furosemide.  Pt still enjoying everyday activities at home such as gardening and baking. No complaints otherwise.   Patient Active Problem List   Diagnosis Date Noted   Stage IV squamous cell carcinoma of right lung ([place]) 12/02/2018    Priority: High   Abdominal aortic aneurysm (AAA) greater than 5.0 cm in diameter in female [facility]) 11/05/2018    Priority: High   COPD (chronic obstructive pulmonary disease) ([place]) 09/23/2016    Priority: High   Essential hypertension 04/17/2015    Priority: High   Major depressive disorder, recurrent episode ([place]) 04/17/2015    Priority: High   Cancer associated pain 12/30/2018    Priority: Medium    Glucose intolerance 10/02/2015    Priority: Medium    Adenomatous colon polyp 10/02/2015    Priority: Medium    Osteoarthritis cervical spine 05/26/2013    Priority: Medium    GERD 06/25/2007    Priority: Medium    BPPV (benign paroxysmal positional vertigo) 12/02/2016    Priority: Low   OSA (obstructive sleep apnea) 10/02/2015    Priority: Low   Hyperlipidemia 07/29/2006    Priority: Low    Axillary lump, left 07/12/2021   Headache 12/08/2019   Port-A-Cath in place 07/07/2019     Current Outpatient Medications  Medication Sig Dispense Refill   chlorthalidone (HYGROTON) 25 MG tablet Take 1 tablet (25 mg total) by mouth daily. 90 tablet 1   acetaminophen (TYLENOL) 500 MG tablet Take 500 mg by mouth every 6 (six) hours as needed for moderate pain.     albuterol (VENTOLIN HFA) 108 (90 Base) MCG/ACT inhaler TAKE 2 PUFFS BY MOUTH EVERY 6 HOURS AS NEEDED FOR WHEEZE OR SHORTNESS OF BREATH 26.8 each 2   amLODipine (NORVASC) 10 MG tablet TAKE 1 TABLET EVERY DAY 90 tablet 3   atorvastatin (LIPITOR) 20 MG tablet TAKE 1 TABLET EVERY DAY 90 tablet 3   famotidine (PEPCID) 20 MG tablet Take 20 mg by mouth daily.      FLUoxetine (PROZAC) 20 MG capsule Take 1 capsule (20 mg total) by mouth daily. 90 capsule 3   fluticasone (FLONASE) 50 MCG/ACT nasal spray Place 1 spray into both nostrils daily. 11.1 mL 0   gabapentin (NEURONTIN) 100 MG capsule TAKE 1 CAPSULE TWICE DAILY 90 capsule 3   lidocaine-prilocaine (EMLA) cream Apply 1 application topically as needed. 30 g 1   magnesium oxide (MAG-OX) 400 (240 Mg) MG tablet TAKE 1 TABLET BY MOUTH EVERY DAY 90 tablet 0   polyvinyl alcohol (LIQUIFILM TEARS) 1.4 % ophthalmic solution Place 1 drop into both eyes as  needed for dry eyes.     No current facility-administered medications for this visit.     Review of Systems: Pertinent ROS listed in A&P. Otherwise negative.   Objective:   Physical Exam: Vitals:   06/03/22 0927 06/03/22 1022  BP: (!) 155/80 (!) 146/93  Pulse: 65 64  Temp: 98.1 F (36.7 C)   TempSrc: Oral   SpO2: 98%   Weight: 137 lb 8 oz (62.4 kg)   Height: '5\' 2"'$  (1.575 m)    General:   awake and alert, sitting comfortably in chair, cooperative, not in acute distress Skin:   warm and dry, intact without any obvious lesions or scars, no rashes Eyes:   extraocular movements intact Lungs:   normal respiratory effort, breathing  unlabored, symmetrical chest rise Cardiac:   RRR, no murmurs, rubs or gallops  Neurologic:   oriented to person-place-time, moving all extremities Psychiatric:   euthymic mood with congruent affect    Assessment & Plan:   Headache Pt with continued headaches unchanged from last visit. Notes she does get relief with ibuprofen, however does not take this regularly. Will encourage continued use of ibuprofen at home. Will also administer Toradol shot while in the office today.  > 30 mg Toradol injection  > Ibuprofen use at home  Essential hypertension BP today 155/80 largely unchanged from last visit. Pt's home log reveals BP is elevated most days of the week in the 140s over 80s. She is compliant on her amlodipine 10 mg and furosemide 40 mg daily. Given history of AAA, would like to obtain better control of her hypertension. Will continue amlodipine and switch furosemide to chlorthalidone.   > amlodipine 10 mg daily > switch furosemide to chlorthalidone 25 mg daily   Abdominal aortic aneurysm (AAA) greater than 5.0 cm in diameter in female [facility]) Managed by vascular surgery, was offered stenting.  We discussed risks and benefits, I did recommend going forward with the procedure given that she has a pretty good quality of life and has responded well to the palliative immunotherapy for her lung cancer.  We are continuing medical management with atorvastatin and are increasing antihypertensives today for better blood pressure control.

## 2022-06-03 NOTE — Patient Instructions (Addendum)
Today we discussed the following:  1) Headaches  Please continue to take the ibuprofen as needed for headaches. Only take it for several days at a time with small breaks in between.    2) Hypertension  Please continue taking amlodipine, 1 tablet daily. Stop taking lasix. Start taking chlorthalidone 1 tablet daily.

## 2022-06-03 NOTE — Assessment & Plan Note (Signed)
BP today 155/80 largely unchanged from last visit. Pt's home log reveals BP is elevated most days of the week in the 140s over 80s. She is compliant on her amlodipine 10 mg and furosemide 40 mg daily. Given history of AAA, would like to obtain better control of her hypertension. Will continue amlodipine and switch furosemide to chlorthalidone.   > amlodipine 10 mg daily > switch furosemide to chlorthalidone 25 mg daily

## 2022-06-03 NOTE — Assessment & Plan Note (Addendum)
Pt with continued headaches unchanged from last visit. Notes she does get relief with ibuprofen, however does not take this regularly. Will encourage continued use of ibuprofen at home. Will also administer Toradol shot while in the office today.  > 30 mg Toradol injection  > Ibuprofen use at home

## 2022-06-03 NOTE — Assessment & Plan Note (Signed)
Managed by vascular surgery, was offered stenting.  We discussed risks and benefits, I did recommend going forward with the procedure given that she has a pretty good quality of life and has responded well to the palliative immunotherapy for her lung cancer.  We are continuing medical management with atorvastatin and are increasing antihypertensives today for better blood pressure control.

## 2022-06-03 NOTE — Progress Notes (Signed)
Attestation for Student Documentation:  I personally was present and performed or re-performed the history, physical exam and medical decision-making activities of this service and have verified that the service and findings are accurately documented in the student's note.  77 year old person here for follow-up of hypertension and with acute concerns of headache.  The way she describes the headache is consistent with a tension type headache.  She has had this headache on and off for many years.  We last ruled out temporal arteritis in 2019 when she had normal sed rate.  Few months ago she had an MRI brain which ruled out metastatic disease.  I her symptoms are still most consistent with a tension type headache and they seem to respond well to NSAIDs.  We gave her 30 mg of Toradol in the office for her headache today.  Talked about appropriate use of ibuprofen, gave counseling to avoid overuse which could worsen her headache.  We talked for a while about her abdominal aortic aneurysm for which she was offered surgical treatment.  She is unsure if she wants to proceed because of risks of the operation and the need for hospitalization.  I did encourage her to go through with the surgery, I anticipate it would be beneficial for her, and I have been impressed by her functional status and response to palliative immunotherapy.  We are going to increase antihypertensives, will COVID-19 25 mg to amlodipine 10 mg.  She was on this medication before, but discontinued about 2 years ago when she was hypotensive during chemotherapy.  [name], MD 06/03/2022, 2:06 PM

## 2022-06-26 ENCOUNTER — Telehealth: Payer: Self-pay | Admitting: Internal Medicine

## 2022-06-26 NOTE — Telephone Encounter (Signed)
Contacted patient to scheduled appointments. Patient is aware of appointments that are scheduled.   

## 2022-07-04 ENCOUNTER — Other Ambulatory Visit: Payer: Self-pay | Admitting: Student in an Organized Health Care Education/Training Program

## 2022-07-05 NOTE — Telephone Encounter (Signed)
Call to patient stated that she did not request the refill on the Lasix must have come from the Pharmacy.Is taking the new medication prescribed.  Has a little bit of swelling still.  Advised to get compression stockings.  Has some compression stockings and will start to put them on.

## 2022-07-05 NOTE — Telephone Encounter (Signed)
Lasix was switched to chlorthalidone at last visit. If patient is having more LE swelling, should use compression stockings. Let me know if there is any other reason for requesting restart of this med.

## 2022-07-06 ENCOUNTER — Other Ambulatory Visit: Payer: Self-pay | Admitting: Student in an Organized Health Care Education/Training Program

## 2022-07-11 ENCOUNTER — Inpatient Hospital Stay: Payer: Medicare HMO | Attending: Internal Medicine | Admitting: Internal Medicine

## 2022-07-11 ENCOUNTER — Encounter (HOSPITAL_COMMUNITY): Payer: Self-pay

## 2022-07-11 ENCOUNTER — Other Ambulatory Visit: Payer: Self-pay

## 2022-07-11 ENCOUNTER — Ambulatory Visit (HOSPITAL_COMMUNITY)
Admission: RE | Admit: 2022-07-11 | Discharge: 2022-07-11 | Disposition: A | Discharge status: Home / Self Care | Payer: Medicare HMO | Source: Ambulatory Visit | Attending: Internal Medicine | Admitting: Internal Medicine

## 2022-07-11 DIAGNOSIS — J9811 Atelectasis: Secondary | ICD-10-CM | POA: Diagnosis not present

## 2022-07-11 DIAGNOSIS — Z86718 Personal history of other venous thrombosis and embolism: Secondary | ICD-10-CM | POA: Insufficient documentation

## 2022-07-11 DIAGNOSIS — I7123 Aneurysm of the descending thoracic aorta, without rupture: Secondary | ICD-10-CM | POA: Insufficient documentation

## 2022-07-11 DIAGNOSIS — J432 Centrilobular emphysema: Secondary | ICD-10-CM | POA: Diagnosis not present

## 2022-07-11 DIAGNOSIS — Z87891 Personal history of nicotine dependence: Secondary | ICD-10-CM | POA: Insufficient documentation

## 2022-07-11 DIAGNOSIS — Z79899 Other long term (current) drug therapy: Secondary | ICD-10-CM | POA: Insufficient documentation

## 2022-07-11 DIAGNOSIS — K5730 Diverticulosis of large intestine without perforation or abscess without bleeding: Secondary | ICD-10-CM | POA: Diagnosis not present

## 2022-07-11 DIAGNOSIS — Z9221 Personal history of antineoplastic chemotherapy: Secondary | ICD-10-CM | POA: Insufficient documentation

## 2022-07-11 DIAGNOSIS — N3289 Other specified disorders of bladder: Secondary | ICD-10-CM | POA: Diagnosis not present

## 2022-07-11 DIAGNOSIS — C3490 Malignant neoplasm of unspecified part of unspecified bronchus or lung: Secondary | ICD-10-CM

## 2022-07-11 DIAGNOSIS — Z85118 Personal history of other malignant neoplasm of bronchus and lung: Secondary | ICD-10-CM | POA: Insufficient documentation

## 2022-07-11 DIAGNOSIS — K7689 Other specified diseases of liver: Secondary | ICD-10-CM | POA: Diagnosis not present

## 2022-07-11 LAB — CBC WITH DIFFERENTIAL (CANCER CENTER ONLY)
Abs Immature Granulocytes: 0 10*3/uL (ref 0.00–0.07)
Basophils Absolute: 0 10*3/uL (ref 0.0–0.1)
Basophils Relative: 1 %
Eosinophils Absolute: 0.1 10*3/uL (ref 0.0–0.5)
Eosinophils Relative: 4 %
HCT: 38.5 % (ref 36.0–46.0)
Hemoglobin: 13.4 g/dL (ref 12.0–15.0)
Immature Granulocytes: 0 %
Lymphocytes Relative: 46 %
Lymphs Abs: 1.5 10*3/uL (ref 0.7–4.0)
MCH: 32.4 pg (ref 26.0–34.0)
MCHC: 34.8 g/dL (ref 30.0–36.0)
MCV: 93.2 fL (ref 80.0–100.0)
Monocytes Absolute: 0.3 10*3/uL (ref 0.1–1.0)
Monocytes Relative: 10 %
Neutro Abs: 1.2 10*3/uL — ABNORMAL LOW (ref 1.7–7.7)
Neutrophils Relative %: 39 %
Platelet Count: 175 10*3/uL (ref 150–400)
RBC: 4.13 MIL/uL (ref 3.87–5.11)
RDW: 14.3 % (ref 11.5–15.5)
WBC Count: 3.2 10*3/uL — ABNORMAL LOW (ref 4.0–10.5)
nRBC: 0 % (ref 0.0–0.2)

## 2022-07-11 LAB — CMP (CANCER CENTER ONLY)
ALT: 9 U/L (ref 0–44)
AST: 14 U/L — ABNORMAL LOW (ref 15–41)
Albumin: 4.1 g/dL (ref 3.5–5.0)
Alkaline Phosphatase: 100 U/L (ref 38–126)
Anion gap: 6 (ref 5–15)
BUN: 19 mg/dL (ref 8–23)
CO2: 33 mmol/L — ABNORMAL HIGH (ref 22–32)
Calcium: 9.6 mg/dL (ref 8.9–10.3)
Chloride: 99 mmol/L (ref 98–111)
Creatinine: 0.79 mg/dL (ref 0.44–1.00)
GFR, Estimated: >60 mL/min (ref >60)
Glucose, Bld: 90 mg/dL (ref 70–99)
Potassium: 3.1 mmol/L — ABNORMAL LOW (ref 3.5–5.1)
Sodium: 138 mmol/L (ref 135–145)
Total Bilirubin: 1 mg/dL (ref 0.3–1.2)
Total Protein: 7.4 g/dL (ref 6.5–8.1)

## 2022-07-11 MED ORDER — IOHEXOL 300 MG/ML  SOLN
100.0000 mL | Freq: Once | INTRAMUSCULAR | Status: AC | PRN
  Administered 2022-07-11: 100 mL via INTRAVENOUS

## 2022-07-11 MED ORDER — SODIUM CHLORIDE (PF) 0.9 % IJ SOLN
INTRAMUSCULAR | Status: AC
  Filled 2022-07-11: qty 50

## 2022-07-15 ENCOUNTER — Encounter: Payer: Self-pay | Admitting: Internal Medicine

## 2022-07-15 ENCOUNTER — Telehealth: Payer: Self-pay

## 2022-07-15 NOTE — Progress Notes (Signed)
This encounter was created in error - please disregard.

## 2022-07-15 NOTE — Telephone Encounter (Signed)
This nurse reached out to patient per provider request.  Made her aware that lab results shows that her Potassium is little low.  Advised that the provider would like her to increase foods in her diet that are high in Potassium.  This nurse offered to mail a list of Potassium rich foods.  Patient is in agreement.  No further questions or concerns noted at this time.

## 2022-07-15 NOTE — Telephone Encounter (Signed)
-----   Message from Si Gaul, MD sent at 07/11/2022  4:51 PM EDT ----- Please encourage her to increase the potassium rich diet. ----- Message ----- From: Interface, Lab In [place] Sent: 07/11/2022  10:10 AM EDT To: Si Gaul, MD

## 2022-07-16 ENCOUNTER — Inpatient Hospital Stay: Payer: Medicare HMO | Admitting: Internal Medicine

## 2022-07-16 ENCOUNTER — Ambulatory Visit: Payer: Medicare HMO | Admitting: Internal Medicine

## 2022-07-16 ENCOUNTER — Other Ambulatory Visit: Payer: Self-pay

## 2022-07-16 ENCOUNTER — Encounter: Payer: Self-pay | Admitting: Medical Oncology

## 2022-07-16 VITALS — BP 132/91 | HR 73 | Temp 98.3°F | Resp 14 | Wt 136.5 lb

## 2022-07-16 DIAGNOSIS — Z85118 Personal history of other malignant neoplasm of bronchus and lung: Secondary | ICD-10-CM | POA: Diagnosis not present

## 2022-07-16 DIAGNOSIS — Z9221 Personal history of antineoplastic chemotherapy: Secondary | ICD-10-CM | POA: Diagnosis not present

## 2022-07-16 DIAGNOSIS — Z79899 Other long term (current) drug therapy: Secondary | ICD-10-CM | POA: Diagnosis not present

## 2022-07-16 DIAGNOSIS — C3490 Malignant neoplasm of unspecified part of unspecified bronchus or lung: Secondary | ICD-10-CM | POA: Diagnosis not present

## 2022-07-16 DIAGNOSIS — I7123 Aneurysm of the descending thoracic aorta, without rupture: Secondary | ICD-10-CM | POA: Diagnosis not present

## 2022-07-16 DIAGNOSIS — Z86718 Personal history of other venous thrombosis and embolism: Secondary | ICD-10-CM | POA: Diagnosis not present

## 2022-07-16 DIAGNOSIS — Z87891 Personal history of nicotine dependence: Secondary | ICD-10-CM | POA: Diagnosis not present

## 2022-07-16 NOTE — Progress Notes (Unsigned)
FMLA paperwork for husband placed in [place] folder.

## 2022-07-16 NOTE — Progress Notes (Signed)
[facility] [phone]   [phone]  OFFICE PROGRESS NOTE  Tyson Alias, MD [address] [place] [address]  DIAGNOSIS: Stage IV non-small cell lung cancer, squamous cell carcinoma. She presented with right upper lobe lung mass in addition to pleural-based metastasis and mediastinal lymphadenopathy. She was diagnosed in September 2020.    PRIOR THERAPY: Chemotherapy with carboplatin for an AUC of 5, paclitaxel 175 mg/m, and Keytruda 200 mg IV every 3 weeks with Neulasta support. Last dose 12/08/19. Status post 18 cycles.  Starting from cycle #5 was on maintenance treatment with single agent Keytruda every 3 weeks. This was discontinued due to evidence of disease progression.    CURRENT THERAPY: Palliative systemic chemotherapy with docetaxel 75 mg/m2 and Cyramza 10 mg/kg IV every 3 weeks with Neulasta support. First dose on 01/04/20.  Status post 24 cycles.  Starting from cycle #10 docetaxel was reduced to 65 Mg/M2.  Her treatment is currently on hold since March 2023 secondary to intolerance and pancolitis  INTERVAL HISTORY: Debra Rodgers 77 y.o. female returns to the clinic today for follow-up visit accompanied by her husband.  The patient is feeling fine today with no concerning complaints.  She denied having any current chest pain, shortness of breath, cough or hemoptysis.  She has no nausea, vomiting, diarrhea or constipation.  She has no headache or visual changes.  She denied having any recent weight loss or night sweats.  She has no fever or chills.  She is here today for evaluation with repeat CT scan of the chest, abdomen and pelvis for restaging of her disease.  MEDICAL HISTORY: Past Medical History:  Diagnosis Date   AAA (abdominal aortic aneurysm)    COPD (chronic obstructive pulmonary disease)    Depression    DVT (deep venous thrombosis) 07/12/2019   Encounter for antineoplastic chemotherapy 12/02/2018   Encounter  for antineoplastic immunotherapy 12/02/2018   Essential hypertension    Goals of care, counseling/discussion 12/02/2018   MOST form completed 07/12/19:  DNR/DNI but full scope of usual care. Available in [facility]   Headache    History of migraine headaches    lung ca dx'd 10/2018   Sleep apnea    Tobacco use disorder     ALLERGIES:  is allergic to codeine sulfate and pantoprazole sodium.  MEDICATIONS:  Current Outpatient Medications  Medication Sig Dispense Refill   acetaminophen (TYLENOL) 500 MG tablet Take 500 mg by mouth every 6 (six) hours as needed for moderate pain.     albuterol (VENTOLIN HFA) 108 (90 Base) MCG/ACT inhaler TAKE 2 PUFFS BY MOUTH EVERY 6 HOURS AS NEEDED FOR WHEEZE OR SHORTNESS OF BREATH 26.8 each 2   amLODipine (NORVASC) 10 MG tablet TAKE 1 TABLET EVERY DAY 90 tablet 3   atorvastatin (LIPITOR) 20 MG tablet TAKE 1 TABLET EVERY DAY 90 tablet 3   chlorthalidone (HYGROTON) 25 MG tablet Take 1 tablet (25 mg total) by mouth daily. 90 tablet 1   famotidine (PEPCID) 20 MG tablet Take 20 mg by mouth daily.      FLUoxetine (PROZAC) 20 MG capsule Take 1 capsule (20 mg total) by mouth daily. 90 capsule 3   fluticasone (FLONASE) 50 MCG/ACT nasal spray Place 1 spray into both nostrils daily. 11.1 mL 0   gabapentin (NEURONTIN) 100 MG capsule TAKE 1 CAPSULE TWICE DAILY 90 capsule 3   lidocaine-prilocaine (EMLA) cream Apply 1 application topically as needed. 30 g 1  magnesium oxide (MAG-OX) 400 (240 Mg) MG tablet TAKE 1 TABLET BY MOUTH EVERY DAY 90 tablet 0   polyvinyl alcohol (LIQUIFILM TEARS) 1.4 % ophthalmic solution Place 1 drop into both eyes as needed for dry eyes.     No current facility-administered medications for this visit.    SURGICAL HISTORY:  Past Surgical History:  Procedure Laterality Date   ABDOMINAL HYSTERECTOMY     CHOLECYSTECTOMY     IR IMAGING GUIDED PORT INSERTION  02/24/2019   ROTATOR CUFF REPAIR  3/04   VIDEO BRONCHOSCOPY WITH ENDOBRONCHIAL ULTRASOUND Right  11/25/2018   Procedure: VIDEO BRONCHOSCOPY WITH ENDOBRONCHIAL ULTRASOUND;  Surgeon: Leslye Peer, MD;  Location: [facility] OR;  Service: Thoracic;  Laterality: Right;    REVIEW OF SYSTEMS:  Constitutional: positive for fatigue Eyes: negative Ears, nose, mouth, throat, and face: negative Respiratory: negative Cardiovascular: negative Gastrointestinal: negative Genitourinary:negative Integument/breast: negative Hematologic/lymphatic: negative Musculoskeletal:negative Neurological: negative Behavioral/Psych: negative Endocrine: negative Allergic/Immunologic: negative   PHYSICAL EXAMINATION: General appearance: alert, cooperative, and no distress Head: Normocephalic, without obvious abnormality, atraumatic Neck: no adenopathy, no JVD, supple, symmetrical, trachea midline, and thyroid not enlarged, symmetric, no tenderness/mass/nodules Lymph nodes: Cervical, supraclavicular, and axillary nodes normal. Resp: clear to auscultation bilaterally Back: symmetric, no curvature. ROM normal. No CVA tenderness. Cardio: regular rate and rhythm, S1, S2 normal, no murmur, click, rub or gallop GI: soft, non-tender; bowel sounds normal; no masses,  no organomegaly Extremities: extremities normal, atraumatic, no cyanosis or edema Neurologic: Alert and oriented X 3, normal strength and tone. Normal symmetric reflexes. Normal coordination and gait  ECOG PERFORMANCE STATUS: 1 - Symptomatic but completely ambulatory  Blood pressure (!) 132/91, pulse 73, temperature 98.3 F (36.8 C), temperature source Oral, resp. rate 14, weight 136 lb 8 oz (61.9 kg), SpO2 96 %.  LABORATORY DATA: Lab Results  Component Value Date   WBC 3.2 (L) 07/11/2022   HGB 13.4 07/11/2022   HCT 38.5 07/11/2022   MCV 93.2 07/11/2022   PLT 175 07/11/2022      Chemistry      Component Value Date/Time   NA 138 07/11/2022 1000   NA 142 08/01/2021 1523   K 3.1 (L) 07/11/2022 1000   CL 99 07/11/2022 1000   CO2 33 (H) 07/11/2022  1000   BUN 19 07/11/2022 1000   BUN 22 08/01/2021 1523   CREATININE 0.79 07/11/2022 1000   CREATININE 0.79 09/03/2013 1540      Component Value Date/Time   CALCIUM 9.6 07/11/2022 1000   ALKPHOS 100 07/11/2022 1000   AST 14 (L) 07/11/2022 1000   ALT 9 07/11/2022 1000   BILITOT 1.0 07/11/2022 1000       RADIOGRAPHIC STUDIES: CT Chest W Contrast  Result Date: 07/14/2022 CLINICAL DATA:  Metastatic lung cancer, follow-up EXAM: CT CHEST, ABDOMEN, AND PELVIS WITH CONTRAST TECHNIQUE: Multidetector CT imaging of the chest, abdomen and pelvis was performed following the standard protocol during bolus administration of intravenous contrast. RADIATION DOSE REDUCTION: This exam was performed according to the departmental dose-optimization program which includes automated exposure control, adjustment of the mA and/or kV according to patient size and/or use of iterative reconstruction technique. CONTRAST:  OMNIPAQUE IOHEXOL 300 MG/ML  SOLN COMPARISON:  CTA chest dated 04/25/2022. CT chest abdomen pelvis dated 04/10/2022. FINDINGS: CT CHEST FINDINGS Cardiovascular: Ascending thoracic aorta measures 3.9 cm, within the upper limits of normal. However, the descending thoracic aorta measures 4.1 cm (series 2/image 42), mildly aneurysmal. Atherosclerotic calcifications of the arch. The heart is normal in  size.  No pericardial effusion. Mild coronary atherosclerosis of the LAD. Right chest port terminates in the lower SVC. Mediastinum/Nodes: No suspicious mediastinal lymphadenopathy. Visualized thyroid is unremarkable. Lungs/Pleura: Moderate centrilobular and paraseptal emphysematous changes, upper lung predominant. Mild eventration of the left hemidiaphragm with associated left basilar atelectasis. No focal consolidation. No suspicious pulmonary nodules. No pleural effusion or pneumothorax. Musculoskeletal: Mild degenerative changes of the thoracic spine. Cervical spine fixation hardware. CT ABDOMEN PELVIS  FINDINGS Hepatobiliary: Small hepatic cysts measuring up to 9 mm in the right hepatic lobe (series 2/image 87), unchanged. Status post cholecystectomy. Mild intrahepatic ductal prominence, unchanged. Common duct measures 13 mm and smoothly tapers at the ampulla. Pancreas: Within normal limits. Spleen: Within normal limits. Adrenals/Urinary Tract: Adrenal glands are within normal limits. Mild left renal parenchymal atrophy. Right kidney is within normal limits. No hydronephrosis. Thick-walled bladder, although underdistended. Stomach/Bowel: Stomach is within normal limits. No evidence of bowel obstruction. Appendix is not discretely visualized. Sigmoid diverticulosis, without evidence of diverticulitis. Vascular/Lymphatic: 4.3 x 5.0 cm suprarenal abdominal aortic aneurysm (series 2/image 37), unchanged. Stable chronic outpouching along the right lateral aspect of the aneurysm inferiorly (series 2/image 62). Atherosclerotic calcifications of the abdominal aorta and branch vessels. No suspicious abdominopelvic lymphadenopathy. Reproductive: Status post hysterectomy. 8.0 x 7.2 cm cystic lesion in the left adnexa (series 2/image 90), previously 8.2 x 7.3 cm when remeasured in a similar fashion, unchanged. Right ovary is within normal limits. Other: No abdominopelvic ascites. Musculoskeletal: Degenerative changes of the lumbar spine. IMPRESSION: No evidence of recurrent or metastatic disease. 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 Attention on follow-up is suggested. 4.3 x 5.0 cm suprarenal abdominal aortic aneurysm. Recommend follow-up CT/MR every 6 months and vascular consultation. This recommendation follows ACR consensus guidelines: White Paper of the ACR Incidental Findings Committee II on Vascular Findings. J Am Coll Radiol 2013; 10:789-794. 8.0 x 7.2 cm cystic lesion in the left adnexa, unchanged. While lesions of this size technically warrant surgical evaluation, given the patient's comorbidities and cancer  surveillance, attention on follow-up is a reasonable approach. Additional stable ancillary findings as above. Aortic Atherosclerosis (ICD10-I70.0) and Emphysema (ICD10-J43.9). Electronically Signed   By: Charline Bills M.D.   On: 07/14/2022 19:40   CT Abdomen Pelvis W Contrast  Result Date: 07/14/2022 CLINICAL DATA:  Metastatic lung cancer, follow-up EXAM: CT CHEST, ABDOMEN, AND PELVIS WITH CONTRAST TECHNIQUE: Multidetector CT imaging of the chest, abdomen and pelvis was performed following the standard protocol during bolus administration of intravenous contrast. RADIATION DOSE REDUCTION: This exam was performed according to the departmental dose-optimization program which includes automated exposure control, adjustment of the mA and/or kV according to patient size and/or use of iterative reconstruction technique. CONTRAST:  OMNIPAQUE IOHEXOL 300 MG/ML  SOLN COMPARISON:  CTA chest dated 04/25/2022. CT chest abdomen pelvis dated 04/10/2022. FINDINGS: CT CHEST FINDINGS Cardiovascular: Ascending thoracic aorta measures 3.9 cm, within the upper limits of normal. However, the descending thoracic aorta measures 4.1 cm (series 2/image 42), mildly aneurysmal. Atherosclerotic calcifications of the arch. The heart is normal in size.  No pericardial effusion. Mild coronary atherosclerosis of the LAD. Right chest port terminates in the lower SVC. Mediastinum/Nodes: No suspicious mediastinal lymphadenopathy. Visualized thyroid is unremarkable. Lungs/Pleura: Moderate centrilobular and paraseptal emphysematous changes, upper lung predominant. Mild eventration of the left hemidiaphragm with associated left basilar atelectasis. No focal consolidation. No suspicious pulmonary nodules. No pleural effusion or pneumothorax. Musculoskeletal: Mild degenerative changes of the thoracic spine. Cervical spine fixation hardware. CT ABDOMEN PELVIS FINDINGS Hepatobiliary: Small  hepatic cysts measuring up to 9 mm in the right  hepatic lobe (series 2/image 87), unchanged. Status post cholecystectomy. Mild intrahepatic ductal prominence, unchanged. Common duct measures 13 mm and smoothly tapers at the ampulla. Pancreas: Within normal limits. Spleen: Within normal limits. Adrenals/Urinary Tract: Adrenal glands are within normal limits. Mild left renal parenchymal atrophy. Right kidney is within normal limits. No hydronephrosis. Thick-walled bladder, although underdistended. Stomach/Bowel: Stomach is within normal limits. No evidence of bowel obstruction. Appendix is not discretely visualized. Sigmoid diverticulosis, without evidence of diverticulitis. Vascular/Lymphatic: 4.3 x 5.0 cm suprarenal abdominal aortic aneurysm (series 2/image 37), unchanged. Stable chronic outpouching along the right lateral aspect of the aneurysm inferiorly (series 2/image 62). Atherosclerotic calcifications of the abdominal aorta and branch vessels. No suspicious abdominopelvic lymphadenopathy. Reproductive: Status post hysterectomy. 8.0 x 7.2 cm cystic lesion in the left adnexa (series 2/image 90), previously 8.2 x 7.3 cm when remeasured in a similar fashion, unchanged. Right ovary is within normal limits. Other: No abdominopelvic ascites. Musculoskeletal: Degenerative changes of the lumbar spine. IMPRESSION: No evidence of recurrent or metastatic disease. 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 Attention on follow-up is suggested. 4.3 x 5.0 cm suprarenal abdominal aortic aneurysm. Recommend follow-up CT/MR every 6 months and vascular consultation. This recommendation follows ACR consensus guidelines: White Paper of the ACR Incidental Findings Committee II on Vascular Findings. J Am Coll Radiol 2013; 10:789-794. 8.0 x 7.2 cm cystic lesion in the left adnexa, unchanged. While lesions of this size technically warrant surgical evaluation, given the patient's comorbidities and cancer surveillance, attention on follow-up is a reasonable approach. Additional  stable ancillary findings as above. Aortic Atherosclerosis (ICD10-I70.0) and Emphysema (ICD10-J43.9). Electronically Signed   By: Charline Bills M.D.   On: 07/14/2022 19:40    ASSESSMENT AND PLAN: This is a very pleasant 77 years old African-American female with stage IV non-small cell carcinoma,, squamous cell carcinoma diagnosed in September 2020.  She presented with extensive right-sided pleural and thoracic nodal hypermetabolic disease with no extrathoracic disease. The patient started induction treatment with systemic chemotherapy with carboplatin, paclitaxel and Keytruda status post 4 cycles with partial response after cycle #4.  This was followed by 14 cycles of maintenance treatment with single agent Keytruda discontinued secondary to disease progression. The patient underwent second line systemic chemotherapy with docetaxel 75 mg/M2 and Cyramza 10 mg/KG every 3 weeks with Neulasta support.  Status post 24 cycles.  Starting from cycle #10 her dose of docetaxel was reduced to 65 Mg/M2. Her treatment has been on hold for the last 14 months secondary to pancolitis seen on previous imaging studies in February 2023. The patient is feeling fine today with no concerning complaints except for mild fatigue. She had repeat CT scan of the chest, abdomen and pelvis performed recently.  I personally and independently reviewed the scan and discussed the result with the patient and her husband. Her scan showed no concerning findings for disease recurrence or metastasis but she continues to have the descending thoracic aortic aneurysm as well as cystic lesion in the left adnexa that are unchanged. I recommended for the patient to continue on observation with repeat CT scan of the chest, abdomen and pelvis in 4 months. Regarding the thoracic aneurysm, she is followed by vascular surgery. Regarding the cystic lesion in the left adnexa, this is a stable but I offered the patient referral to gynecological oncology  for evaluation if needed. The patient was advised to call immediately if she has any other concerning symptoms in the  interval. The patient voices understanding of current disease status and treatment options and is in agreement with the current care plan. All questions were answered. The patient knows to call the clinic with any problems, questions or concerns. We can certainly see the patient much sooner if necessary.   Disclaimer: This note was dictated with voice recognition software. Similar sounding words can inadvertently be transcribed and may not be corrected upon review.

## 2022-08-26 ENCOUNTER — Ambulatory Visit (INDEPENDENT_AMBULATORY_CARE_PROVIDER_SITE_OTHER): Payer: Medicare HMO

## 2022-08-26 ENCOUNTER — Encounter: Payer: Self-pay | Admitting: Student in an Organized Health Care Education/Training Program

## 2022-08-26 ENCOUNTER — Ambulatory Visit (INDEPENDENT_AMBULATORY_CARE_PROVIDER_SITE_OTHER): Payer: Medicare HMO | Admitting: Student in an Organized Health Care Education/Training Program

## 2022-08-26 VITALS — BP 134/83 | HR 66 | Temp 98.1°F | Ht 62.0 in | Wt 142.1 lb

## 2022-08-26 DIAGNOSIS — I714 Abdominal aortic aneurysm, without rupture, unspecified: Secondary | ICD-10-CM | POA: Diagnosis not present

## 2022-08-26 DIAGNOSIS — C3491 Malignant neoplasm of unspecified part of right bronchus or lung: Secondary | ICD-10-CM

## 2022-08-26 DIAGNOSIS — R519 Headache, unspecified: Secondary | ICD-10-CM | POA: Diagnosis not present

## 2022-08-26 DIAGNOSIS — I10 Essential (primary) hypertension: Secondary | ICD-10-CM

## 2022-08-26 DIAGNOSIS — F330 Major depressive disorder, recurrent, mild: Secondary | ICD-10-CM

## 2022-08-26 DIAGNOSIS — Z0000 Encounter for general adult medical examination without abnormal findings: Secondary | ICD-10-CM | POA: Diagnosis not present

## 2022-08-26 NOTE — Progress Notes (Signed)
Established Patient Office Visit  Subjective   Patient ID: Debra Rodgers, female    DOB: 1945/12/28  Age: 77 y.o. MRN: [id number]  Chief Complaint  Patient presents with   Headache   Medication Refill   Fall    HPI  77 year old person here for follow-up of headache and hypertension.  Sadly the patient lost her elder sister here at [facility] on Saturday evening.  Sister was 76 years old, died of complications of Alzheimer's dementia and heart disease.  This has been difficult for Debra Rodgers.  After visiting her sister she had a fall at home, struck her right leg and her head.  Did not lose consciousness, did not seek care afterwards.  Has a mild headache today, typical of her usual tension type headache that she gets often throughout the week.  Says that it is easing up right now.  No other pain in her hips or knees, she is walking okay with the assistance of a cane.  Feeling some stress as the responsibility of making arrangements and managing her sister's estate will fall to her.  Denies any chest pain or shortness of breath right now.  Reports good adherence with her medications.  Denies worsening depressed mood, though she does feel sad with acute grief.    Objective:     BP (!) 150/84 (BP Location: Right Arm, Patient Position: Sitting, Cuff Size: Small)   Pulse 70   Temp 98.1 F (36.7 C) (Oral)   Ht 5\' 2"  (1.575 m)   Wt 142 lb 1.6 oz (64.5 kg)   SpO2 100%   BMI 25.99 kg/m    Physical Exam  Gen: Frail appearing older woman, no distress Neck: Normal, no lymphadenopathy, no thyromegaly, right tunneled port catheter appears normal Lymph: No axillary or cervical lymphadenopathy CV: Regular rate and rhythm, 3 of 6 early systolic murmur at the right upper sternal border Lungs: Unlabored, clear to auscultation throughout Ext: Warm and well-perfused with no lower extremity edema, extensive osteoarthritis of bilateral hands Psych: Appropriate mood and affect, sad  appearing, not anxious appearing Neuro: Alert, conversational, slow get up and go from a chair, slow wide-based gait with the use of a cane, full strength in the upper and lower extremities    Assessment & Plan:   Problem List Items Addressed This Visit       High   Essential hypertension (Chronic)    Chronic and stable problem.  Blood pressure is a little high in the office today by her at home readings are much more appropriate with average systolic around 125 and diastolic around 80.  Will plan to continue with amlodipine 10 mg and chlorthalidone 25 mg daily.  On the balance given her frailty and stage IV lung cancer, I think current control of her hypertension is adequate.      Major depressive disorder, recurrent episode (HCC) (Chronic)    History of depression on treatment with fluoxetine 20 mg daily.  Now with acute grief after the passing of her 28 year old sister just this weekend.  We talked about feelings of grief and how they can impact mood.  No changes in medications today but I offered more support in the future if needed being counseling or adjustments in her medication.      Abdominal aortic aneurysm (AAA) greater than 5.0 cm in diameter in female [facility]) (Chronic)    Abdominal aortic aneurysm at 5 cm on CT scan in April.  This is being managed by vascular  surgery.  Patient is contemplating surgical repair.  Difficult decision for her given her overall frailty, incurable lung cancer, now acute grief with recent death of her sister.  She is going to get through services for her sister in the coming weeks and then will follow-up with her vascular surgeon when she has made a decision.      Stage IV squamous cell carcinoma of right lung (HCC) - Primary (Chronic)    The managed by Dr. Arbutus Ped, completed a course of palliative immunotherapy and not currently on treatment.  Recent CT scan was stable.        Unprioritized   Headache (Chronic)    Continues to have intermittent  tension type headache diffusely across the scalp.  No high risk features today.  We talked about safe use of ibuprofen up to 3 times weekly.  She had little benefit after Toradol injection at her last office visit.  We can continue to offer this if needed for severe headaches.       Follow-up with me in 3 months   Tyson Alias, MD

## 2022-08-26 NOTE — Assessment & Plan Note (Signed)
The managed by Dr. Arbutus Ped, completed a course of palliative immunotherapy and not currently on treatment.  Recent CT scan was stable.

## 2022-08-26 NOTE — Assessment & Plan Note (Signed)
Abdominal aortic aneurysm at 5 cm on CT scan in April.  This is being managed by vascular surgery.  Patient is contemplating surgical repair.  Difficult decision for her given her overall frailty, incurable lung cancer, now acute grief with recent death of her sister.  She is going to get through services for her sister in the coming weeks and then will follow-up with her vascular surgeon when she has made a decision.

## 2022-08-26 NOTE — Assessment & Plan Note (Signed)
History of depression on treatment with fluoxetine 20 mg daily.  Now with acute grief after the passing of her 76 year old sister just this weekend.  We talked about feelings of grief and how they can impact mood.  No changes in medications today but I offered more support in the future if needed being counseling or adjustments in her medication.

## 2022-08-26 NOTE — Patient Instructions (Signed)

## 2022-08-26 NOTE — Progress Notes (Signed)
Subjective:   Debra Rodgers is a 77 y.o. female who presents for Medicare Annual (Subsequent) preventive examination.  Review of Systems    Defer to PCP.        Objective:    Today's Vitals   08/26/22 1427 08/26/22 1428  BP: 134/83   Pulse: 66   Temp: 98.1 F (36.7 C)   TempSrc: Oral   SpO2: 100%   Weight: 142 lb 1.6 oz (64.5 kg)   Height: 5\' 2"  (1.575 m)   PainSc:  8    Body mass index is 25.99 kg/m.     08/26/2022    2:35 PM 08/26/2022   11:01 AM 06/03/2022    9:31 AM 05/03/2022   10:05 AM 04/25/2022    1:19 PM 09/10/2021   11:36 AM 08/22/2021   10:03 AM  Advanced Directives  Does Patient Have a Medical Advance Directive? No No No No No No No  Would patient like information on creating a medical advance directive? No - Patient declined No - Patient declined No - Patient declined No - Patient declined No - Patient declined No - Patient declined No - Patient declined    Current Medications (verified) Outpatient Encounter Medications as of 08/26/2022  Medication Sig   acetaminophen (TYLENOL) 500 MG tablet Take 500 mg by mouth every 6 (six) hours as needed for moderate pain.   albuterol (VENTOLIN HFA) 108 (90 Base) MCG/ACT inhaler TAKE 2 PUFFS BY MOUTH EVERY 6 HOURS AS NEEDED FOR WHEEZE OR SHORTNESS OF BREATH   amLODipine (NORVASC) 10 MG tablet TAKE 1 TABLET EVERY DAY   atorvastatin (LIPITOR) 20 MG tablet TAKE 1 TABLET EVERY DAY   chlorthalidone (HYGROTON) 25 MG tablet Take 1 tablet (25 mg total) by mouth daily.   famotidine (PEPCID) 20 MG tablet Take 20 mg by mouth daily.    FLUoxetine (PROZAC) 20 MG capsule Take 1 capsule (20 mg total) by mouth daily.   fluticasone (FLONASE) 50 MCG/ACT nasal spray Place 1 spray into both nostrils daily.   gabapentin (NEURONTIN) 100 MG capsule TAKE 1 CAPSULE TWICE DAILY   lidocaine-prilocaine (EMLA) cream Apply 1 application topically as needed.   polyvinyl alcohol (LIQUIFILM TEARS) 1.4 % ophthalmic solution Place 1 drop into both eyes as  needed for dry eyes.   No facility-administered encounter medications on file as of 08/26/2022.    Allergies (verified) Codeine sulfate and Pantoprazole sodium   History: Past Medical History:  Diagnosis Date   AAA (abdominal aortic aneurysm) (HCC)    COPD (chronic obstructive pulmonary disease) (HCC)    Depression    DVT (deep venous thrombosis) (HCC) 07/12/2019   Encounter for antineoplastic chemotherapy 12/02/2018   Encounter for antineoplastic immunotherapy 12/02/2018   Essential hypertension    Goals of care, counseling/discussion 12/02/2018   MOST form completed 07/12/19:  DNR/DNI but full scope of usual care. Available in [facility]   Headache    History of migraine headaches    lung ca dx'd 10/2018   Sleep apnea    Tobacco use disorder    Past Surgical History:  Procedure Laterality Date   ABDOMINAL HYSTERECTOMY     CHOLECYSTECTOMY     IR IMAGING GUIDED PORT INSERTION  02/24/2019   ROTATOR CUFF REPAIR  3/04   VIDEO BRONCHOSCOPY WITH ENDOBRONCHIAL ULTRASOUND Right 11/25/2018   Procedure: VIDEO BRONCHOSCOPY WITH ENDOBRONCHIAL ULTRASOUND;  Surgeon: Leslye Peer, MD;  Location: [facility] OR;  Service: Thoracic;  Laterality: Right;   Family History  Problem Relation Age of  Onset   Hypertension Mother    Stroke Mother    Coronary artery disease Mother    Heart disease Father    Diabetes Sister    Hypertension Sister    Cancer Sister    Breast cancer Sister    Social History   Socioeconomic History   Marital status: Married    Spouse name: Not on file   Number of children: Not on file   Years of education: Not on file   Highest education level: Not on file  Occupational History   Not on file  Tobacco Use   Smoking status: Former    Packs/day: 1.00    Years: 56.00    Additional pack years: 0.00    Total pack years: 56.00    Types: Cigarettes    Start date: 1964    Quit date: 11/04/2018    Years since quitting: 3.8   Smokeless tobacco: Never  Vaping Use   Vaping Use: Never  used  Substance and Sexual Activity   Alcohol use: No    Alcohol/week: 0.0 standard drinks of alcohol   Drug use: No   Sexual activity: Never    Partners: Male  Other Topics Concern   Not on file  Social History Narrative   Married, Regular Exercise- yes   Social Determinants of Health   Financial Resource Strain: Low Risk  (08/26/2022)   Overall Financial Resource Strain (CARDIA)    Difficulty of Paying Living Expenses: Not hard at all  Food Insecurity: No Food Insecurity (08/26/2022)   Hunger Vital Sign    Worried About Running Out of Food in the Last Year: Never true    Ran Out of Food in the Last Year: Never true  Transportation Needs: No Transportation Needs (06/22/2021)   PRAPARE - Administrator, Civil Service (Medical): No    Lack of Transportation (Non-Medical): No  Physical Activity: Insufficiently Active (08/26/2022)   Exercise Vital Sign    Days of Exercise per Week: 1 day    Minutes of Exercise per Session: 20 min  Stress: Stress Concern Present (08/26/2022)   [employer] of Occupational Health - Occupational Stress Questionnaire    Feeling of Stress : To some extent  Social Connections: Moderately Isolated (08/26/2022)   Social Connection and Isolation Panel [NHANES]    Frequency of Communication with Friends and Family: More than three times a week    Frequency of Social Gatherings with Friends and Family: Twice a week    Attends Religious Services: Never    Database administrator or Organizations: No    Attends Engineer, structural: Never    Marital Status: Married    Tobacco Counseling Counseling given: Not Answered   Clinical Intake:  Pre-visit preparation completed: Yes  Pain : 0-10 Pain Score: 8  Pain Type: Acute pain Pain Location: Head Pain Orientation: Right, Left, Anterior, Posterior, Upper, Mid Pain Descriptors / Indicators: Aching Pain Onset: More than a month ago Pain Frequency: Intermittent     BMI - recorded:  25.99 Nutritional Status: BMI 25 -29 Overweight Nutritional Risks: None Diabetes: No  How often do you need to have someone help you when you read instructions, pamphlets, or other written materials from your doctor or pharmacy?: 1 - Never What is the last grade level you completed in school?: 9th grade  Diabetic?NO   Interpreter Needed?: No  Information entered by :: Laurynn Mccorvey, CMA 08/26/2022   Activities of Daily Living    08/26/2022  2:35 PM 08/26/2022   11:00 AM  In your present state of health, do you have any difficulty performing the following activities:  Hearing? 0 0  Vision? 0 0  Difficulty concentrating or making decisions? 0 0  Walking or climbing stairs? 1 1  Dressing or bathing? 0 0  Doing errands, shopping? 0 0    Patient Care Team: Tyson Alias, MD as PCP - General (Internal Medicine)  Indicate any recent Medical Services you may have received from other than [facility] providers in the past year (date may be approximate).     Assessment:   This is a routine wellness examination for Debra Rodgers.  Hearing/Vision screen No results found.  Dietary issues and exercise activities discussed:     Goals Addressed   None   Depression Screen    08/26/2022    2:35 PM 08/26/2022    2:34 PM 08/26/2022   11:05 AM 06/03/2022   11:29 AM 05/03/2022   10:14 AM 08/22/2021   10:02 AM 08/01/2021    2:35 PM  PHQ 2/9 Scores  PHQ - 2 Score 0 0 0 0 2 0 1  PHQ- 9 Score     7      Fall Risk    08/26/2022    2:35 PM 08/26/2022    8:57 AM 06/03/2022    9:31 AM 05/03/2022   10:04 AM 04/25/2022    1:19 PM  Fall Risk   Falls in the past year? 1 1 0 0 0  Number falls in past yr: 1 0  0 0  Injury with Fall? 1 0 0 0 0  Risk for fall due to : Impaired balance/gait;Impaired mobility   No Fall Risks   Follow up Falls evaluation completed Falls evaluation completed Falls evaluation completed Falls evaluation completed;Falls prevention discussed Falls evaluation completed    FALL RISK  PREVENTION PERTAINING TO THE HOME:  Any stairs in or around the home? Yes  If so, are there any without handrails? No  Home free of loose throw rugs in walkways, pet beds, electrical cords, etc? Yes  Adequate lighting in your home to reduce risk of falls? Yes   ASSISTIVE DEVICES UTILIZED TO PREVENT FALLS:  Life alert? No  Use of a cane, walker or w/c? Yes  Grab bars in the bathroom? Yes  Shower chair or bench in shower? Yes  Elevated toilet seat or a handicapped toilet? Yes   TIMED UP AND GO:  Was the test performed? No .  Length of time to ambulate 10 feet: n/a sec.     Cognitive Function:        08/26/2022    2:35 PM 06/22/2021    2:18 PM  6CIT Screen  What Year? 0 points 0 points  What month? 0 points 0 points  What time? 0 points 0 points  Count back from 20 0 points 0 points  Months in reverse 0 points 0 points  Repeat phrase 0 points 0 points  Total Score 0 points 0 points    Immunizations Immunization History  Administered Date(s) Administered   Fluad Quad(high Dose 65+) 12/16/2018   Influenza,inj,Quad PF,6+ Mos 12/02/2016, 02/05/2021   PFIZER(Purple Top)SARS-COV-2 Vaccination 06/24/2019, 07/19/2019   PPD Test 01/27/2013, 02/25/2014, 11/17/2015   Pneumococcal Conjugate-13 10/02/2015   Pneumococcal Polysaccharide-23 12/02/2016   Td 05/03/2011   Tdap 06/11/2021    TDAP status: Up to date  Flu Vaccine status: Up to date  Pneumococcal vaccine status: Up to date  Covid-19 vaccine status:  Information provided on how to obtain vaccines.   Qualifies for Shingles Vaccine? Yes   Zostavax completed No   Shingrix Completed?: No.    Education has been provided regarding the importance of this vaccine. Patient has been advised to call insurance company to determine out of pocket expense if they have not yet received this vaccine. Advised may also receive vaccine at local pharmacy or Health Dept. Verbalized acceptance and understanding.  Screening Tests Health  Maintenance  Topic Date Due   Zoster Vaccines- Shingrix (1 of 2) Never done   Colonoscopy  10/18/2018   COVID-19 Vaccine (3 - Pfizer risk series) 08/16/2019   INFLUENZA VACCINE  10/24/2022   Medicare Annual Wellness (AWV)  08/26/2023   DTaP/Tdap/Td (3 - Td or Tdap) 06/12/2031   Pneumonia Vaccine 28+ Years old  Completed   DEXA SCAN  Completed   Hepatitis C Screening  Completed   HPV VACCINES  Aged Out    Health Maintenance  Health Maintenance Due  Topic Date Due   Zoster Vaccines- Shingrix (1 of 2) Never done   Colonoscopy  10/18/2018   COVID-19 Vaccine (3 - Pfizer risk series) 08/16/2019    Colorectal cancer screening: Defer to PCP.    Bone Density status: Completed 05/19/2014. Results reflect: Bone density results: OSTEOPOROSIS. Repeat every 2 years.  Lung Cancer Screening: (Low Dose CT Chest recommended if Age 78-80 years, 30 pack-year currently smoking OR have quit w/in 15years.) does not qualify.   Lung Cancer Screening Referral: Defer to PCP.   Additional Screening:  Hepatitis C Screening: does qualify; Completed 09/03/2013  Vision Screening: Recommended annual ophthalmology exams for early detection of glaucoma and other disorders of the eye. Is the patient up to date with their annual eye exam?  Yes  Who is the provider or what is the name of the office in which the patient attends annual eye exams? [facility]  If pt is not established with a provider, would they like to be referred to a provider to establish care? No .   Dental Screening: Recommended annual dental exams for proper oral hygiene  Community Resource Referral / Chronic Care Management: CRR required this visit?  No   CCM required this visit?  No      Plan:     I have personally reviewed and noted the following in the patient's chart:   Medical and social history Use of alcohol, tobacco or illicit drugs  Current medications and supplements including opioid prescriptions. Patient is not  currently taking opioid prescriptions. Functional ability and status Nutritional status Physical activity Advanced directives List of other physicians Hospitalizations, surgeries, and ER visits in previous 12 months Vitals Screenings to include cognitive, depression, and falls Referrals and appointments  In addition, I have reviewed and discussed with patient certain preventive protocols, quality metrics, and best practice recommendations. A written personalized care plan for preventive services as well as general preventive health recommendations were provided to patient.     Shantoria Ellwood, CMA   08/26/2022   Nurse Notes: Face to Face.   Ms. Hamada , Thank you for taking time to come for your Medicare Wellness Visit. I appreciate your ongoing commitment to your health goals. Please review the following plan we discussed and let me know if I can assist you in the future.   These are the goals we discussed:  Goals   None     This is a list of the screening recommended for you and due dates:  Health Maintenance  Topic Date Due   Zoster (Shingles) Vaccine (1 of 2) Never done   Colon Cancer Screening  10/18/2018   COVID-19 Vaccine (3 - Pfizer risk series) 08/16/2019   Flu Shot  10/24/2022   Medicare Annual Wellness Visit  08/26/2023   DTaP/Tdap/Td vaccine (3 - Td or Tdap) 06/12/2031   Pneumonia Vaccine  Completed   DEXA scan (bone density measurement)  Completed   Hepatitis C Screening  Completed   HPV Vaccine  Aged Out

## 2022-08-26 NOTE — Assessment & Plan Note (Signed)
Continues to have intermittent tension type headache diffusely across the scalp.  No high risk features today.  We talked about safe use of ibuprofen up to 3 times weekly.  She had little benefit after Toradol injection at her last office visit.  We can continue to offer this if needed for severe headaches.

## 2022-08-26 NOTE — Assessment & Plan Note (Signed)
Chronic and stable problem.  Blood pressure is a little high in the office today by her at home readings are much more appropriate with average systolic around 125 and diastolic around 80.  Will plan to continue with amlodipine 10 mg and chlorthalidone 25 mg daily.  On the balance given her frailty and stage IV lung cancer, I think current control of her hypertension is adequate.

## 2022-08-31 ENCOUNTER — Other Ambulatory Visit: Payer: Self-pay | Admitting: Student in an Organized Health Care Education/Training Program

## 2022-09-17 ENCOUNTER — Other Ambulatory Visit: Payer: Self-pay | Admitting: Student in an Organized Health Care Education/Training Program

## 2022-09-17 DIAGNOSIS — Z1231 Encounter for screening mammogram for malignant neoplasm of breast: Secondary | ICD-10-CM

## 2022-09-24 ENCOUNTER — Ambulatory Visit
Admission: RE | Admit: 2022-09-24 | Discharge: 2022-09-24 | Disposition: A | Discharge status: Home / Self Care | Payer: Medicare HMO | Source: Ambulatory Visit | Attending: Student in an Organized Health Care Education/Training Program | Admitting: Student in an Organized Health Care Education/Training Program

## 2022-09-24 DIAGNOSIS — Z1231 Encounter for screening mammogram for malignant neoplasm of breast: Secondary | ICD-10-CM

## 2022-10-14 ENCOUNTER — Other Ambulatory Visit: Payer: Medicare HMO

## 2022-10-14 ENCOUNTER — Ambulatory Visit: Payer: Medicare HMO | Admitting: Internal Medicine

## 2022-10-24 ENCOUNTER — Other Ambulatory Visit: Payer: Self-pay | Admitting: Student in an Organized Health Care Education/Training Program

## 2022-10-24 DIAGNOSIS — I10 Essential (primary) hypertension: Secondary | ICD-10-CM

## 2022-11-11 ENCOUNTER — Other Ambulatory Visit: Payer: Self-pay

## 2022-11-11 ENCOUNTER — Ambulatory Visit (HOSPITAL_COMMUNITY)
Admission: RE | Admit: 2022-11-11 | Discharge: 2022-11-11 | Disposition: A | Discharge status: Home / Self Care | Payer: Medicare HMO | Source: Ambulatory Visit | Attending: Internal Medicine | Admitting: Internal Medicine

## 2022-11-11 ENCOUNTER — Inpatient Hospital Stay: Payer: Medicare HMO | Attending: Internal Medicine

## 2022-11-11 ENCOUNTER — Other Ambulatory Visit: Payer: Self-pay | Admitting: Internal Medicine

## 2022-11-11 DIAGNOSIS — Z9221 Personal history of antineoplastic chemotherapy: Secondary | ICD-10-CM | POA: Insufficient documentation

## 2022-11-11 DIAGNOSIS — I719 Aortic aneurysm of unspecified site, without rupture: Secondary | ICD-10-CM | POA: Diagnosis not present

## 2022-11-11 DIAGNOSIS — C782 Secondary malignant neoplasm of pleura: Secondary | ICD-10-CM | POA: Insufficient documentation

## 2022-11-11 DIAGNOSIS — I71012 Dissection of descending thoracic aorta: Secondary | ICD-10-CM | POA: Insufficient documentation

## 2022-11-11 DIAGNOSIS — C3411 Malignant neoplasm of upper lobe, right bronchus or lung: Secondary | ICD-10-CM | POA: Insufficient documentation

## 2022-11-11 DIAGNOSIS — C3490 Malignant neoplasm of unspecified part of unspecified bronchus or lung: Secondary | ICD-10-CM

## 2022-11-11 DIAGNOSIS — J439 Emphysema, unspecified: Secondary | ICD-10-CM | POA: Diagnosis not present

## 2022-11-11 LAB — CBC WITH DIFFERENTIAL (CANCER CENTER ONLY)
Abs Immature Granulocytes: 0 10*3/uL (ref 0.00–0.07)
Basophils Absolute: 0 10*3/uL (ref 0.0–0.1)
Basophils Relative: 1 %
Eosinophils Absolute: 0.1 10*3/uL (ref 0.0–0.5)
Eosinophils Relative: 4 %
HCT: 38.8 % (ref 36.0–46.0)
Hemoglobin: 13.3 g/dL (ref 12.0–15.0)
Immature Granulocytes: 0 %
Lymphocytes Relative: 42 %
Lymphs Abs: 1.6 10*3/uL (ref 0.7–4.0)
MCH: 32.4 pg (ref 26.0–34.0)
MCHC: 34.3 g/dL (ref 30.0–36.0)
MCV: 94.6 fL (ref 80.0–100.0)
Monocytes Absolute: 0.4 10*3/uL (ref 0.1–1.0)
Monocytes Relative: 10 %
Neutro Abs: 1.6 10*3/uL — ABNORMAL LOW (ref 1.7–7.7)
Neutrophils Relative %: 43 %
Platelet Count: 188 10*3/uL (ref 150–400)
RBC: 4.1 MIL/uL (ref 3.87–5.11)
RDW: 13.4 % (ref 11.5–15.5)
WBC Count: 3.7 10*3/uL — ABNORMAL LOW (ref 4.0–10.5)
nRBC: 0 % (ref 0.0–0.2)

## 2022-11-11 LAB — CMP (CANCER CENTER ONLY)
ALT: 10 U/L (ref 0–44)
AST: 15 U/L (ref 15–41)
Albumin: 4 g/dL (ref 3.5–5.0)
Alkaline Phosphatase: 86 U/L (ref 38–126)
Anion gap: 7 (ref 5–15)
BUN: 21 mg/dL (ref 8–23)
CO2: 33 mmol/L — ABNORMAL HIGH (ref 22–32)
Calcium: 9.4 mg/dL (ref 8.9–10.3)
Chloride: 99 mmol/L (ref 98–111)
Creatinine: 0.95 mg/dL (ref 0.44–1.00)
GFR, Estimated: >60 mL/min (ref >60)
Glucose, Bld: 93 mg/dL (ref 70–99)
Potassium: 3 mmol/L — ABNORMAL LOW (ref 3.5–5.1)
Sodium: 139 mmol/L (ref 135–145)
Total Bilirubin: 1.1 mg/dL (ref 0.3–1.2)
Total Protein: 7.3 g/dL (ref 6.5–8.1)

## 2022-11-11 MED ORDER — SODIUM CHLORIDE (PF) 0.9 % IJ SOLN
INTRAMUSCULAR | Status: AC
  Filled 2022-11-11: qty 50

## 2022-11-11 MED ORDER — IOHEXOL 300 MG/ML  SOLN
100.0000 mL | Freq: Once | INTRAMUSCULAR | Status: AC | PRN
  Administered 2022-11-11: 100 mL via INTRAVENOUS

## 2022-11-13 ENCOUNTER — Inpatient Hospital Stay: Payer: Medicare HMO | Admitting: Internal Medicine

## 2022-11-13 VITALS — BP 134/85 | HR 71 | Temp 98.5°F | Resp 16 | Ht 62.0 in | Wt 144.4 lb

## 2022-11-13 DIAGNOSIS — C3490 Malignant neoplasm of unspecified part of unspecified bronchus or lung: Secondary | ICD-10-CM | POA: Diagnosis not present

## 2022-11-13 DIAGNOSIS — I71012 Dissection of descending thoracic aorta: Secondary | ICD-10-CM | POA: Diagnosis not present

## 2022-11-13 DIAGNOSIS — C782 Secondary malignant neoplasm of pleura: Secondary | ICD-10-CM | POA: Diagnosis not present

## 2022-11-13 DIAGNOSIS — C3411 Malignant neoplasm of upper lobe, right bronchus or lung: Secondary | ICD-10-CM | POA: Diagnosis not present

## 2022-11-13 DIAGNOSIS — Z9221 Personal history of antineoplastic chemotherapy: Secondary | ICD-10-CM | POA: Diagnosis not present

## 2022-11-13 NOTE — Progress Notes (Signed)
[facility] [phone]   [phone]  OFFICE PROGRESS NOTE  Tyson Alias, MD [address] [place] [address]  DIAGNOSIS: Stage IV non-small cell lung cancer, squamous cell carcinoma. She presented with right upper lobe lung mass in addition to pleural-based metastasis and mediastinal lymphadenopathy. She was diagnosed in September 2020.    PRIOR THERAPY: Chemotherapy with carboplatin for an AUC of 5, paclitaxel 175 mg/m, and Keytruda 200 mg IV every 3 weeks with Neulasta support. Last dose 12/08/19. Status post 18 cycles.  Starting from cycle #5 was on maintenance treatment with single agent Keytruda every 3 weeks. This was discontinued due to evidence of disease progression.    CURRENT THERAPY: Palliative systemic chemotherapy with docetaxel 75 mg/m2 and Cyramza 10 mg/kg IV every 3 weeks with Neulasta support. First dose on 01/04/20.  Status post 24 cycles.  Starting from cycle #10 docetaxel was reduced to 65 Mg/M2.  Her treatment is currently on hold since March 2023 secondary to intolerance and pancolitis  INTERVAL HISTORY: Debra Rodgers 77 y.o. female turns to the clinic today for follow-up visit.  The patient has no complaints today except for the mild fatigue.  She denied having any current chest pain, shortness of breath, cough or hemoptysis.  She has no nausea, vomiting, diarrhea or constipation.  She has no headache or visual changes.  She has no recent weight loss or night sweats.  She has been in observation since March 2023.  The patient is here today for evaluation with repeat CT scan of the chest, abdomen and pelvis for restaging of her disease. MEDICAL HISTORY: Past Medical History:  Diagnosis Date   AAA (abdominal aortic aneurysm) (HCC)    COPD (chronic obstructive pulmonary disease) (HCC)    Depression    DVT (deep venous thrombosis) (HCC) 07/12/2019   Encounter for antineoplastic chemotherapy 12/02/2018    Encounter for antineoplastic immunotherapy 12/02/2018   Essential hypertension    Goals of care, counseling/discussion 12/02/2018   MOST form completed 07/12/19:  DNR/DNI but full scope of usual care. Available in [facility]   Headache    History of migraine headaches    lung ca dx'd 10/2018   Sleep apnea    Tobacco use disorder     ALLERGIES:  is allergic to codeine sulfate and pantoprazole sodium.  MEDICATIONS:  Current Outpatient Medications  Medication Sig Dispense Refill   acetaminophen (TYLENOL) 500 MG tablet Take 500 mg by mouth every 6 (six) hours as needed for moderate pain.     albuterol (VENTOLIN HFA) 108 (90 Base) MCG/ACT inhaler TAKE 2 PUFFS BY MOUTH EVERY 6 HOURS AS NEEDED FOR WHEEZE OR SHORTNESS OF BREATH 26.8 each 2   amLODipine (NORVASC) 10 MG tablet TAKE 1 TABLET EVERY DAY 90 tablet 3   atorvastatin (LIPITOR) 20 MG tablet TAKE 1 TABLET EVERY DAY 90 tablet 3   chlorthalidone (HYGROTON) 25 MG tablet Take 1 tablet (25 mg total) by mouth daily. 90 tablet 1   famotidine (PEPCID) 20 MG tablet Take 20 mg by mouth daily.      FLUoxetine (PROZAC) 20 MG capsule TAKE 1 CAPSULE EVERY DAY 90 capsule 3   fluticasone (FLONASE) 50 MCG/ACT nasal spray Place 1 spray into both nostrils daily. 11.1 mL 0   gabapentin (NEURONTIN) 100 MG capsule TAKE 1 CAPSULE TWICE DAILY 90 capsule 3   lidocaine-prilocaine (EMLA) cream Apply 1 application topically as needed. 30 g 1   polyvinyl alcohol (  LIQUIFILM TEARS) 1.4 % ophthalmic solution Place 1 drop into both eyes as needed for dry eyes.     No current facility-administered medications for this visit.    SURGICAL HISTORY:  Past Surgical History:  Procedure Laterality Date   ABDOMINAL HYSTERECTOMY     CHOLECYSTECTOMY     IR IMAGING GUIDED PORT INSERTION  02/24/2019   ROTATOR CUFF REPAIR  3/04   VIDEO BRONCHOSCOPY WITH ENDOBRONCHIAL ULTRASOUND Right 11/25/2018   Procedure: VIDEO BRONCHOSCOPY WITH ENDOBRONCHIAL ULTRASOUND;  Surgeon: Leslye Peer, MD;   Location: [facility] OR;  Service: Thoracic;  Laterality: Right;    REVIEW OF SYSTEMS:  A comprehensive review of systems was negative except for: Constitutional: positive for fatigue   PHYSICAL EXAMINATION: General appearance: alert, cooperative, fatigued, and no distress Head: Normocephalic, without obvious abnormality, atraumatic Neck: no adenopathy, no JVD, supple, symmetrical, trachea midline, and thyroid not enlarged, symmetric, no tenderness/mass/nodules Lymph nodes: Cervical, supraclavicular, and axillary nodes normal. Resp: clear to auscultation bilaterally Back: symmetric, no curvature. ROM normal. No CVA tenderness. Cardio: regular rate and rhythm, S1, S2 normal, no murmur, click, rub or gallop GI: soft, non-tender; bowel sounds normal; no masses,  no organomegaly Extremities: extremities normal, atraumatic, no cyanosis or edema  ECOG PERFORMANCE STATUS: 1 - Symptomatic but completely ambulatory  Blood pressure 134/85, pulse 71, temperature 98.5 F (36.9 C), temperature source Oral, resp. rate 16, height 5\' 2"  (1.575 m), weight 144 lb 6.4 oz (65.5 kg), SpO2 100%.  LABORATORY DATA: Lab Results  Component Value Date   WBC 3.7 (L) 11/11/2022   HGB 13.3 11/11/2022   HCT 38.8 11/11/2022   MCV 94.6 11/11/2022   PLT 188 11/11/2022      Chemistry      Component Value Date/Time   NA 139 11/11/2022 1513   NA 142 08/01/2021 1523   K 3.0 (L) 11/11/2022 1513   CL 99 11/11/2022 1513   CO2 33 (H) 11/11/2022 1513   BUN 21 11/11/2022 1513   BUN 22 08/01/2021 1523   CREATININE 0.95 11/11/2022 1513   CREATININE 0.79 09/03/2013 1540      Component Value Date/Time   CALCIUM 9.4 11/11/2022 1513   ALKPHOS 86 11/11/2022 1513   AST 15 11/11/2022 1513   ALT 10 11/11/2022 1513   BILITOT 1.1 11/11/2022 1513       RADIOGRAPHIC STUDIES: CT CHEST ABDOMEN PELVIS W CONTRAST  Result Date: 11/13/2022 CLINICAL DATA:  Non-small cell lung cancer restaging * Tracking Code: BO * EXAM: CT CHEST,  ABDOMEN, AND PELVIS WITH CONTRAST TECHNIQUE: Multidetector CT imaging of the chest, abdomen and pelvis was performed following the standard protocol during bolus administration of intravenous contrast. RADIATION DOSE REDUCTION: This exam was performed according to the departmental dose-optimization program which includes automated exposure control, adjustment of the mA and/or kV according to patient size and/or use of iterative reconstruction technique. CONTRAST:  OMNIPAQUE IOHEXOL 300 MG/ML  SOLN COMPARISON:  07/11/2022 FINDINGS: CT CHEST FINDINGS Cardiovascular: Right chest port catheter. Severe aortic atherosclerosis. 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 Mild cardiomegaly. Left coronary artery calcifications. No pericardial effusion. Mediastinum/Nodes: No enlarged mediastinal, hilar, or axillary lymph nodes. Thyroid gland, trachea, and esophagus demonstrate no significant findings. Lungs/Pleura: Severe emphysema. Unchanged elevation of the left hemidiaphragm  with associated scarring or atelectasis. No pleural effusion or pneumothorax. Musculoskeletal: No chest wall abnormality. No acute osseous findings. CT ABDOMEN PELVIS FINDINGS Hepatobiliary: No focal liver abnormality is seen. Status post cholecystectomy. Unchanged postoperative biliary ductal dilatation Pancreas: Unremarkable. No pancreatic ductal dilatation or surrounding inflammatory changes. Spleen: Normal in size without significant abnormality. Adrenals/Urinary Tract: Adrenal glands are unremarkable. Kidneys are normal, without renal calculi, solid lesion, or hydronephrosis. Bladder is unremarkable. Stomach/Bowel: Stomach is within normal limits. Appendix appears normal. No evidence of  bowel wall thickening, distention, or inflammatory changes. Sigmoid diverticulosis. Vascular/Lymphatic: Severe aortic atherosclerosis. No enlarged abdominal or pelvic lymph nodes. Reproductive: Status post hysterectomy. Unchanged cystic lesion centered in the left hemipelvis measuring 8.1 x 7.2 cm (series 2, image 91). Other: No abdominal wall hernia or abnormality. No ascites. Musculoskeletal: No acute osseous findings. IMPRESSION: 1. No evidence of recurrent or metastatic disease in the chest, abdomen, or pelvis. 2. Unchanged elevation of the left hemidiaphragm with associated scarring or atelectasis. 3. 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 Recommend follow-up CT/MR every 6 months and vascular consultation if not otherwise imaged and if clinically appropriate in the oncologic setting. This recommendation follows ACR consensus guidelines: White Paper of the ACR Incidental Findings Committee II on Vascular Findings. J Am Coll Radiol 2013; 10:789-794. This recommendation follows ACR consensus guidelines: White Paper of the ACR Incidental Findings Committee II on Vascular Findings. J Am Coll Radiol 2013; 10:789-794. 4. Unchanged cystic lesion centered in the left hemipelvis measuring 8.1 x 7.2 cm. Recommend continued attention on follow-up in the setting of known primary malignancy. 5. Severe emphysema. 6. Coronary artery disease. Aortic Atherosclerosis (ICD10-I70.0) and Emphysema (ICD10-J43.9). Electronically Signed   By: Jearld Lesch M.D.   On: 11/13/2022 17:09    ASSESSMENT AND PLAN: This is a very pleasant 77 years old African-American female with stage IV non-small cell carcinoma,, squamous cell carcinoma diagnosed in September 2020.  She presented with extensive right-sided pleural and  thoracic nodal hypermetabolic disease with no extrathoracic disease. The patient started induction treatment with systemic chemotherapy with carboplatin, paclitaxel and Keytruda status post 4 cycles with partial response after cycle #4.  This was followed by 14 cycles of maintenance treatment with single agent Keytruda discontinued secondary to disease progression. The patient underwent second line systemic chemotherapy with docetaxel 75 mg/M2 and Cyramza 10 mg/KG every 3 weeks with Neulasta support.  Status post 24 cycles.  Starting from cycle #10 her dose of docetaxel was reduced to 65 Mg/M2. Her treatment has been on hold for the last 18 months secondary to pancolitis seen on previous imaging studies in February 2023. The patient is doing fine today with no concerning complaints except for mild fatigue. She had repeat CT scan chest, abdomen and pelvis performed recently.  I personally and independently reviewed the scan before the final report was available and I did not see any concerning findings for disease progression but she continues to have the fusiform aneurysm of the thoracoabdominal aorta. I recommended for the patient to continue on observation with repeat CT scan of the chest, abdomen and pelvis in 6 months. She was advised to call immediately if she has any other concerning symptoms in the interval. Regarding the thoracic aneurysm, she is followed by vascular surgery.   The patient voices understanding of  current disease status and treatment options and is in agreement with the current care plan. All questions were answered. The patient knows to call the clinic with any problems, questions or concerns. We can certainly see the patient much sooner if necessary.   Disclaimer: This note was dictated with voice recognition software. Similar sounding words can inadvertently be transcribed and may not be corrected upon review.

## 2022-11-27 ENCOUNTER — Other Ambulatory Visit: Payer: Self-pay | Admitting: Student in an Organized Health Care Education/Training Program

## 2022-12-02 ENCOUNTER — Encounter: Payer: Self-pay | Admitting: Student in an Organized Health Care Education/Training Program

## 2022-12-02 ENCOUNTER — Ambulatory Visit (INDEPENDENT_AMBULATORY_CARE_PROVIDER_SITE_OTHER): Payer: Medicare HMO | Admitting: Student in an Organized Health Care Education/Training Program

## 2022-12-02 VITALS — BP 132/70 | HR 63 | Temp 98.4°F | Ht 62.0 in | Wt 144.4 lb

## 2022-12-02 DIAGNOSIS — R519 Headache, unspecified: Secondary | ICD-10-CM | POA: Diagnosis not present

## 2022-12-02 DIAGNOSIS — I10 Essential (primary) hypertension: Secondary | ICD-10-CM | POA: Diagnosis not present

## 2022-12-02 DIAGNOSIS — Z23 Encounter for immunization: Secondary | ICD-10-CM | POA: Diagnosis not present

## 2022-12-02 DIAGNOSIS — I714 Abdominal aortic aneurysm, without rupture, unspecified: Secondary | ICD-10-CM | POA: Diagnosis not present

## 2022-12-02 DIAGNOSIS — F330 Major depressive disorder, recurrent, mild: Secondary | ICD-10-CM

## 2022-12-02 DIAGNOSIS — J439 Emphysema, unspecified: Secondary | ICD-10-CM | POA: Diagnosis not present

## 2022-12-02 DIAGNOSIS — Z87891 Personal history of nicotine dependence: Secondary | ICD-10-CM

## 2022-12-02 NOTE — Assessment & Plan Note (Signed)
Blood pressure well-controlled today on amlodipine 10 mg and carvedilol 25 mg daily.  Will continue these medications.  Labs in early August are appropriate.  Follow-up in 6 months.

## 2022-12-02 NOTE — Progress Notes (Signed)
Established Patient Office Visit  Subjective   Patient ID: Debra Rodgers, female    DOB: 28-Jul-1945  Age: 77 y.o. MRN: [id number]  Chief Complaint  Patient presents with   Follow-up    HPI  77 year old person here for management of hypertension.  Doing well since I saw her 3 months ago.  She has stage IV lung cancer currently stable on CT after several years of palliative chemotherapy.  Has been off chemotherapy since 2023.  CT scans have been stable, this is managed by Dr. Shirline Frees.  She is enjoying a good quality of life.  Independent in her activities of daily living.  Living at home with her husband.  Enjoys baking, shopping, walking her dogs.  Has a few concerns today including chronic headache that affects the back of her neck radiating up to her ears and then the back of her head.  This has been an intermittent issue for over 5 years at this point.  This is well treated with ibuprofen, she tries not to take ibuprofen every day.  Dyspnea on exertion is stable.  She can walk about 300 feet before she has to rest.  Does not use supplemental oxygen.  Does not use inhalers except for as needed albuterol rarely.  No recent fevers or chills.  No recent acute illness.  No hospitalizations or ED visits.  Good adherence with medications, denies any side effects.  Denies need of refills or any other equipment at home.    Objective:     BP 132/70 (BP Location: Right Arm, Patient Position: Sitting, Cuff Size: Small)   Pulse 63   Temp 98.4 F (36.9 C) (Oral)   Ht 5\' 2"  (1.575 m)   Wt 144 lb 6.4 oz (65.5 kg)   SpO2 100%   BMI 26.41 kg/m    Physical Exam  General: Well-appearing woman, sitting in a wheelchair, no distress Neck: Normal, no thyromegaly or lymphadenopathy Lungs: Clear throughout, normal work of breathing, no wheezing Heart: Regular rate and rhythm with no murmurs Abdomen: Normal, no tenderness and no masses Extremities: Warm and well-perfused with no lower extremity  edema Psych: Appropriate affect, not depressed or anxious.    Assessment & Plan:   Problem List Items Addressed This Visit       High   Essential hypertension (Chronic)    Blood pressure well-controlled today on amlodipine 10 mg and carvedilol 25 mg daily.  Will continue these medications.  Labs in early August are appropriate.  Follow-up in 6 months.      Major depressive disorder, recurrent episode (HCC) (Chronic)    Chronic issue.  PHQ-9 score of 14 today.  Planning to continue with fluoxetine 20 mg daily.      COPD (chronic obstructive pulmonary disease) (HCC) (Chronic)    Stable and chronic.  Somewhat limited in exertion by dyspnea, unable to walk down to the clinic today.  She is able to complete all her activities of daily living, not requiring supplemental oxygen.  Has emphysema on CT imaging.  Previously did not like using daily Spiriva or Incruse.  Currently only using albuterol as needed, last used about 2 weeks ago.  I offered physical therapy or pulmonary rehabilitation, but she declines.      Abdominal aortic aneurysm (AAA) greater than 5.0 cm in diameter in female [facility]) (Chronic)    5 cm abdominal aortic aneurysm noted on last CT scan.  This is asymptomatic.  She has had consultation with vascular surgery.  She is  contemplative about surgical repair.  Given her advanced age, stage IV cancer, emphysematous lung disease, and current acceptable quality of life, I think it is reasonable to continue monitoring every 6 months.        Medium    Headache (Chronic)    Stable chronic headache that has been an intermittent issue over the last 76 years.  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  Currently not function limiting.  She has had many MRIs of the brain which have not shown CNS metastasis, most recently in February.  We checked inflammatory markers in 2019 when her headache first started, seems low risk given the time  course for giant cell arteritis.  Will continue with supportive care.  We talked about safe use of ibuprofen which is helpful for her several times a week.  Continue with fluoxetine and gabapentin.      Other Visit Diagnoses     Encounter for immunization    -  Primary   Relevant Orders   Flu Vaccine Trivalent High Dose (Fluad) (Completed)       Return in about 6 months (around 06/01/2023).    Tyson Alias, MD

## 2022-12-02 NOTE — Assessment & Plan Note (Signed)
Chronic issue.  PHQ-9 score of 14 today.  Planning to continue with fluoxetine 20 mg daily.

## 2022-12-02 NOTE — Assessment & Plan Note (Signed)
Stable chronic headache that has been an intermittent issue over the last 5 years.  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  Currently not function limiting.  She has had many MRIs of the brain which have not shown CNS metastasis, most recently in February.  We checked inflammatory markers in 2019 when his headache first started, seems low risk given the time course for giant cell arteritis.  Will continue with supportive care.  We talked about safe use of ibuprofen which is helpful for her several times a week.  Continue with fluoxetine and gabapentin.

## 2022-12-02 NOTE — Assessment & Plan Note (Signed)
Stable and chronic.  Somewhat limited in exertion by dyspnea, unable to walk down to the clinic today.  She is able to complete all her activities of daily living, not requiring supplemental oxygen.  Has emphysema on CT imaging.  Previously did not like using daily Spiriva or Incruse.  Currently only using albuterol as needed, last used about 2 weeks ago.  I offered physical therapy or pulmonary rehabilitation, but she declines.

## 2022-12-02 NOTE — Assessment & Plan Note (Signed)
5 cm abdominal aortic aneurysm noted on last CT scan.  This is asymptomatic.  She has had consultation with vascular surgery.  She is contemplative about surgical repair.  Given her advanced age, stage IV cancer, emphysematous lung disease, and current acceptable quality of life, I think it is reasonable to continue monitoring every 6 months.

## 2022-12-23 ENCOUNTER — Ambulatory Visit: Payer: Medicare HMO | Admitting: Student in an Organized Health Care Education/Training Program

## 2022-12-23 ENCOUNTER — Encounter: Payer: Self-pay | Admitting: Student in an Organized Health Care Education/Training Program

## 2022-12-23 VITALS — BP 136/82 | HR 69 | Temp 98.2°F | Ht 62.0 in | Wt 145.3 lb

## 2022-12-23 DIAGNOSIS — M26629 Arthralgia of temporomandibular joint, unspecified side: Secondary | ICD-10-CM

## 2022-12-23 NOTE — Assessment & Plan Note (Signed)
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  She has crepitus over both TMs with any jaw movement.  This is impacting her ability to eat on a daily basis.  Evaluated by her dentist, Dario Ave DDS at [facility], we are requesting records from their office.  If they did not do x-rays, we will do x-rays to confirm arthritis.  It would be unusual for stage IV lung cancer to have bone metastasis to this area, but would want to rule that out.  She is using supportive care with ibuprofen as needed, but the pain and dysfunction is progressing.  Given the severity of her symptoms and physical exam, and because it is starting to affect her ability to eat, I am going to refer her to oral and maxillary surgery to consider steroid injection into the TM joints or other surgical options to manage this severe arthritis.

## 2022-12-23 NOTE — Progress Notes (Signed)
Acute Office Visit  Subjective:     Patient ID: Debra Rodgers, female    DOB: [birth year], 77 y.o.   MRN: [id number]  Chief Complaint  Patient presents with   Follow-up   Headache    HPI  Patient is in today for worsening headache.  Patient has had headaches for many years but they have been worsening over the last few months.  Over the last 1 months she noticed hearing a crunching sound anytime she opens her jaw.  This causes pain around bilateral anterior ears that radiates up into her head.  She was evaluated by her dentist who told her that she had temporomandibular joint dysfunction and arthritis.  She has upper dentures which appeared healthy, lower teeth also appeared healthy without signs of acute infection.  Had a filling placed.  Patient does not remember if she had x-rays done at that visit.  She uses ibuprofen as needed at home which does help, especially at nights.  However the pain is present every time she eats and opens her jaw.  ROS  No fevers or chills.  Good adherence with her other medications.     Objective:    BP 136/82 (BP Location: Left Arm, Patient Position: Sitting, Cuff Size: Small)   Pulse 69   Temp 98.2 F (36.8 C) (Oral)   Ht 5\' 2"  (1.575 m)   Wt 145 lb 4.8 oz (65.9 kg)   SpO2 100%   BMI 26.58 kg/m    Physical Exam  Gen: Well-appearing woman, no distress Mouth: Healthy appearing gums, upper dentures in place, lower teeth look healthy ENT: Severe crepitus is heard audibly when she opens her jaw, I can feel crepitus at both TM joints Ears: Bilateral ears appear healthy but there is wax buildup in both ear canals obscuring the tympanic membranes. 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  She has crepitus over both TMs with any jaw movement.  This is impacting her ability to eat on a daily basis.  Evaluated by her dentist, Dario Ave DDS at [facility], we are requesting records from their office.  If they did not do x-rays, we will do x-rays to confirm arthritis.  It would be unusual for stage IV lung cancer to have bone metastasis to this area, but would want to rule that out.  She is using supportive care with ibuprofen as needed, but the pain and dysfunction is progressing.  Given the severity of her symptoms and physical exam, and because it is starting to affect her ability to eat, I am going to refer her to oral and maxillary surgery to consider steroid injection into the TM joints or other surgical options to manage this severe arthritis.      Relevant Orders   Ambulatory referral to Oral Maxillofacial Surgery    No follow-ups on file.  Tyson Alias, MD

## 2023-03-25 ENCOUNTER — Encounter: Payer: Self-pay | Admitting: *Deleted

## 2023-04-26 ENCOUNTER — Other Ambulatory Visit: Payer: Self-pay | Admitting: Student in an Organized Health Care Education/Training Program

## 2023-05-07 ENCOUNTER — Inpatient Hospital Stay: Payer: Medicare HMO | Attending: Internal Medicine

## 2023-05-07 ENCOUNTER — Other Ambulatory Visit: Payer: Self-pay | Admitting: Internal Medicine

## 2023-05-07 ENCOUNTER — Ambulatory Visit (HOSPITAL_COMMUNITY)
Admission: RE | Admit: 2023-05-07 | Discharge: 2023-05-07 | Disposition: A | Discharge status: Home / Self Care | Payer: Medicare HMO | Source: Ambulatory Visit | Attending: Internal Medicine | Admitting: Internal Medicine

## 2023-05-07 DIAGNOSIS — C3411 Malignant neoplasm of upper lobe, right bronchus or lung: Secondary | ICD-10-CM | POA: Insufficient documentation

## 2023-05-07 DIAGNOSIS — Z9221 Personal history of antineoplastic chemotherapy: Secondary | ICD-10-CM | POA: Diagnosis not present

## 2023-05-07 DIAGNOSIS — C3490 Malignant neoplasm of unspecified part of unspecified bronchus or lung: Secondary | ICD-10-CM | POA: Insufficient documentation

## 2023-05-07 DIAGNOSIS — K529 Noninfective gastroenteritis and colitis, unspecified: Secondary | ICD-10-CM | POA: Diagnosis not present

## 2023-05-07 DIAGNOSIS — C782 Secondary malignant neoplasm of pleura: Secondary | ICD-10-CM | POA: Insufficient documentation

## 2023-05-07 DIAGNOSIS — G4730 Sleep apnea, unspecified: Secondary | ICD-10-CM | POA: Diagnosis not present

## 2023-05-07 DIAGNOSIS — I700 Atherosclerosis of aorta: Secondary | ICD-10-CM | POA: Insufficient documentation

## 2023-05-07 DIAGNOSIS — K5730 Diverticulosis of large intestine without perforation or abscess without bleeding: Secondary | ICD-10-CM | POA: Diagnosis not present

## 2023-05-07 DIAGNOSIS — Z79899 Other long term (current) drug therapy: Secondary | ICD-10-CM | POA: Diagnosis not present

## 2023-05-07 DIAGNOSIS — F17210 Nicotine dependence, cigarettes, uncomplicated: Secondary | ICD-10-CM | POA: Diagnosis not present

## 2023-05-07 DIAGNOSIS — I2510 Atherosclerotic heart disease of native coronary artery without angina pectoris: Secondary | ICD-10-CM | POA: Insufficient documentation

## 2023-05-07 DIAGNOSIS — J439 Emphysema, unspecified: Secondary | ICD-10-CM | POA: Insufficient documentation

## 2023-05-07 DIAGNOSIS — I7123 Aneurysm of the descending thoracic aorta, without rupture: Secondary | ICD-10-CM | POA: Diagnosis not present

## 2023-05-07 DIAGNOSIS — I10 Essential (primary) hypertension: Secondary | ICD-10-CM | POA: Diagnosis not present

## 2023-05-07 DIAGNOSIS — Z86718 Personal history of other venous thrombosis and embolism: Secondary | ICD-10-CM | POA: Insufficient documentation

## 2023-05-07 LAB — CMP (CANCER CENTER ONLY)
ALT: 8 U/L (ref 0–44)
AST: 13 U/L — ABNORMAL LOW (ref 15–41)
Albumin: 4 g/dL (ref 3.5–5.0)
Alkaline Phosphatase: 78 U/L (ref 38–126)
Anion gap: 8 (ref 5–15)
BUN: 20 mg/dL (ref 8–23)
CO2: 33 mmol/L — ABNORMAL HIGH (ref 22–32)
Calcium: 9.5 mg/dL (ref 8.9–10.3)
Chloride: 95 mmol/L — ABNORMAL LOW (ref 98–111)
Creatinine: 0.83 mg/dL (ref 0.44–1.00)
GFR, Estimated: >60 mL/min (ref >60)
Glucose, Bld: 97 mg/dL (ref 70–99)
Potassium: 2.8 mmol/L — ABNORMAL LOW (ref 3.5–5.1)
Sodium: 136 mmol/L (ref 135–145)
Total Bilirubin: 1.1 mg/dL (ref 0.0–1.2)
Total Protein: 7.2 g/dL (ref 6.5–8.1)

## 2023-05-07 LAB — CBC WITH DIFFERENTIAL (CANCER CENTER ONLY)
Abs Immature Granulocytes: 0 10*3/uL (ref 0.00–0.07)
Basophils Absolute: 0 10*3/uL (ref 0.0–0.1)
Basophils Relative: 1 %
Eosinophils Absolute: 0.1 10*3/uL (ref 0.0–0.5)
Eosinophils Relative: 3 %
HCT: 37.8 % (ref 36.0–46.0)
Hemoglobin: 13.2 g/dL (ref 12.0–15.0)
Immature Granulocytes: 0 %
Lymphocytes Relative: 41 %
Lymphs Abs: 1.5 10*3/uL (ref 0.7–4.0)
MCH: 32.2 pg (ref 26.0–34.0)
MCHC: 34.9 g/dL (ref 30.0–36.0)
MCV: 92.2 fL (ref 80.0–100.0)
Monocytes Absolute: 0.4 10*3/uL (ref 0.1–1.0)
Monocytes Relative: 11 %
Neutro Abs: 1.6 10*3/uL — ABNORMAL LOW (ref 1.7–7.7)
Neutrophils Relative %: 44 %
Platelet Count: 212 10*3/uL (ref 150–400)
RBC: 4.1 MIL/uL (ref 3.87–5.11)
RDW: 13 % (ref 11.5–15.5)
WBC Count: 3.5 10*3/uL — ABNORMAL LOW (ref 4.0–10.5)
nRBC: 0 % (ref 0.0–0.2)

## 2023-05-07 MED ORDER — IOHEXOL 300 MG/ML  SOLN
100.0000 mL | Freq: Once | INTRAMUSCULAR | Status: AC | PRN
  Administered 2023-05-07: 100 mL via INTRAVENOUS

## 2023-05-09 ENCOUNTER — Telehealth: Payer: Self-pay

## 2023-05-09 ENCOUNTER — Other Ambulatory Visit: Payer: Self-pay

## 2023-05-09 MED ORDER — POTASSIUM CHLORIDE CRYS ER 20 MEQ PO TBCR: 20.0000 meq | EXTENDED_RELEASE_TABLET | Freq: Two times a day (BID) | ORAL | 0 refills | Status: DC

## 2023-05-09 NOTE — Telephone Encounter (Signed)
Spoke with patient in regards to lab results. Potassium is low.  Per Cassie, PA- sent in potassium supplement to take for the next 7 days.  Informed patient to increase potassium rich foods. Patient verbalized understanding.

## 2023-05-13 ENCOUNTER — Inpatient Hospital Stay (HOSPITAL_BASED_OUTPATIENT_CLINIC_OR_DEPARTMENT_OTHER): Payer: Medicare HMO | Admitting: Internal Medicine

## 2023-05-13 VITALS — BP 109/74 | HR 74 | Temp 98.3°F | Resp 16 | Ht 62.0 in | Wt 140.3 lb

## 2023-05-13 DIAGNOSIS — I2510 Atherosclerotic heart disease of native coronary artery without angina pectoris: Secondary | ICD-10-CM | POA: Diagnosis not present

## 2023-05-13 DIAGNOSIS — K529 Noninfective gastroenteritis and colitis, unspecified: Secondary | ICD-10-CM | POA: Diagnosis not present

## 2023-05-13 DIAGNOSIS — C3490 Malignant neoplasm of unspecified part of unspecified bronchus or lung: Secondary | ICD-10-CM | POA: Diagnosis not present

## 2023-05-13 DIAGNOSIS — G4730 Sleep apnea, unspecified: Secondary | ICD-10-CM | POA: Diagnosis not present

## 2023-05-13 DIAGNOSIS — J439 Emphysema, unspecified: Secondary | ICD-10-CM | POA: Diagnosis not present

## 2023-05-13 DIAGNOSIS — C3411 Malignant neoplasm of upper lobe, right bronchus or lung: Secondary | ICD-10-CM | POA: Diagnosis not present

## 2023-05-13 DIAGNOSIS — I7123 Aneurysm of the descending thoracic aorta, without rupture: Secondary | ICD-10-CM | POA: Diagnosis not present

## 2023-05-13 DIAGNOSIS — F17210 Nicotine dependence, cigarettes, uncomplicated: Secondary | ICD-10-CM | POA: Diagnosis not present

## 2023-05-13 DIAGNOSIS — C782 Secondary malignant neoplasm of pleura: Secondary | ICD-10-CM | POA: Diagnosis not present

## 2023-05-13 DIAGNOSIS — I10 Essential (primary) hypertension: Secondary | ICD-10-CM | POA: Diagnosis not present

## 2023-05-13 NOTE — Progress Notes (Signed)
[facility] [phone]   [phone]  OFFICE PROGRESS NOTE  Tyson Alias, MD [address] [place] [address]  DIAGNOSIS: Stage IV non-small cell lung cancer, squamous cell carcinoma. She presented with right upper lobe lung mass in addition to pleural-based metastasis and mediastinal lymphadenopathy. She was diagnosed in September 2020.    PRIOR THERAPY: Chemotherapy with carboplatin for an AUC of 5, paclitaxel 175 mg/m, and Keytruda 200 mg IV every 3 weeks with Neulasta support. Last dose 12/08/19. Status post 18 cycles.  Starting from cycle #5 was on maintenance treatment with single agent Keytruda every 3 weeks. This was discontinued due to evidence of disease progression.    CURRENT THERAPY: Palliative systemic chemotherapy with docetaxel 75 mg/m2 and Cyramza 10 mg/kg IV every 3 weeks with Neulasta support. First dose on 01/04/20.  Status post 24 cycles.  Starting from cycle #10 docetaxel was reduced to 65 Mg/M2.  Her treatment is currently on hold since March 2023 secondary to intolerance and pancolitis  INTERVAL HISTORY: Debra Rodgers 78 y.o. female returns to the clinic today for follow-up visit accompanied by her husband.Discussed the use of AI scribe software for clinical note transcription with the patient, who gave verbal consent to proceed.  History of Present Illness   Debra Rodgers is a 77 year old female with stage four non-small cell lung cancer who presents for follow-up. She is accompanied by her husband.  Diagnosed with stage four non-small cell lung cancer, squamous cell carcinoma, in September 2020. Initial treatment included carboplatin, paclitaxel, and Keytruda. Due to cancer progression, treatment was switched to docetaxel and ramucirumab, which she received for 24 cycles until March 2023 when treatment was stopped due to colitis and inflammation in her colon. She has been under observation since then. A  recent scan shows no evidence of recurrent or metastatic disease in the chest or abdomen.  Developed colitis in March 2023, leading to cessation of cancer treatment. Currently experiencing intermittent abdominal pain, nausea, and diarrhea, possibly related to dietary factors such as cream and coffee. Diarrhea occurs three to four times a day, often related to dietary intake, including coffee and cream.  Notes mild breathing issues but denies significant chest pain.  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       MEDICAL HISTORY: Past Medical History:  Diagnosis Date   AAA (abdominal aortic aneurysm) (HCC)    COPD (chronic obstructive pulmonary disease) (HCC)    Depression    DVT (deep venous thrombosis) (HCC) 07/12/2019   Encounter for antineoplastic chemotherapy 12/02/2018   Encounter for antineoplastic immunotherapy 12/02/2018   Essential hypertension    Goals of care, counseling/discussion 12/02/2018   MOST form completed 07/12/19:  DNR/DNI but full scope of usual care. Available in VYNCA   Headache    History of migraine headaches    lung ca dx'd 10/2018   Sleep apnea    Tobacco use disorder     ALLERGIES:  is allergic to codeine sulfate and pantoprazole sodium.  MEDICATIONS:  Current Outpatient Medications  Medication Sig Dispense Refill   acetaminophen (TYLENOL) 500 MG tablet Take 500 mg by mouth every 6 (six) hours as needed for moderate pain.     albuterol (VENTOLIN HFA) 108 (90 Base) MCG/ACT inhaler TAKE 2 PUFFS BY MOUTH EVERY 6 HOURS AS NEEDED FOR WHEEZE OR SHORTNESS OF BREATH 26.8 each 2   amLODipine (  NORVASC) 10 MG tablet TAKE 1 TABLET EVERY DAY 90 tablet 3   atorvastatin (LIPITOR) 20 MG tablet TAKE 1 TABLET EVERY DAY 90 tablet 3   chlorthalidone (HYGROTON) 25 MG tablet TAKE 1 TABLET (25 MG TOTAL) BY MOUTH DAILY. 90 tablet 1   famotidine (PEPCID) 20 MG tablet Take 20 mg by mouth daily.      FLUoxetine (PROZAC) 20 MG  capsule TAKE 1 CAPSULE EVERY DAY 90 capsule 3   fluticasone (FLONASE) 50 MCG/ACT nasal spray Place 1 spray into both nostrils daily. 11.1 mL 0   gabapentin (NEURONTIN) 100 MG capsule TAKE 1 CAPSULE TWICE DAILY 90 capsule 3   lidocaine-prilocaine (EMLA) cream Apply 1 application topically as needed. 30 g 1   polyvinyl alcohol (LIQUIFILM TEARS) 1.4 % ophthalmic solution Place 1 drop into both eyes as needed for dry eyes.     potassium chloride SA (KLOR-CON M) 20 MEQ tablet Take 1 tablet (20 mEq total) by mouth 2 (two) times daily for 7 days. 14 tablet 0   No current facility-administered medications for this visit.    SURGICAL HISTORY:  Past Surgical History:  Procedure Laterality Date   ABDOMINAL HYSTERECTOMY     CHOLECYSTECTOMY     IR IMAGING GUIDED PORT INSERTION  02/24/2019   ROTATOR CUFF REPAIR  3/04   VIDEO BRONCHOSCOPY WITH ENDOBRONCHIAL ULTRASOUND Right 11/25/2018   Procedure: VIDEO BRONCHOSCOPY WITH ENDOBRONCHIAL ULTRASOUND;  Surgeon: Leslye Peer, MD;  Location: [facility] OR;  Service: Thoracic;  Laterality: Right;    REVIEW OF SYSTEMS:  Constitutional: positive for fatigue Eyes: negative Ears, nose, mouth, throat, and face: negative Respiratory: positive for dyspnea on exertion Cardiovascular: negative Gastrointestinal: positive for change in bowel habits Genitourinary:negative Integument/breast: negative Hematologic/lymphatic: negative Musculoskeletal:negative Neurological: negative Behavioral/Psych: negative Endocrine: negative Allergic/Immunologic: negative   PHYSICAL EXAMINATION: General appearance: alert, cooperative, fatigued, and no distress Head: Normocephalic, without obvious abnormality, atraumatic Neck: no adenopathy, no JVD, supple, symmetrical, trachea midline, and thyroid not enlarged, symmetric, no tenderness/mass/nodules Lymph nodes: Cervical, supraclavicular, and axillary nodes normal. Resp: clear to auscultation bilaterally Back: symmetric, no curvature.  ROM normal. No CVA tenderness. Cardio: regular rate and rhythm, S1, S2 normal, no murmur, click, rub or gallop GI: soft, non-tender; bowel sounds normal; no masses,  no organomegaly Extremities: extremities normal, atraumatic, no cyanosis or edema Neurologic: Alert and oriented X 3, normal strength and tone. Normal symmetric reflexes. Normal coordination and gait  ECOG PERFORMANCE STATUS: 1 - Symptomatic but completely ambulatory  Blood pressure 109/74, pulse 74, temperature 98.3 F (36.8 C), temperature source Temporal, resp. rate 16, height 5\' 2"  (1.575 m), weight 140 lb 4.8 oz (63.6 kg), SpO2 100%.  LABORATORY DATA: Lab Results  Component Value Date   WBC 3.5 (L) 05/07/2023   HGB 13.2 05/07/2023   HCT 37.8 05/07/2023   MCV 92.2 05/07/2023   PLT 212 05/07/2023      Chemistry      Component Value Date/Time   NA 136 05/07/2023 1140   NA 142 08/01/2021 1523   K 2.8 (L) 05/07/2023 1140   CL 95 (L) 05/07/2023 1140   CO2 33 (H) 05/07/2023 1140   BUN 20 05/07/2023 1140   BUN 22 08/01/2021 1523   CREATININE 0.83 05/07/2023 1140   CREATININE 0.79 09/03/2013 1540      Component Value Date/Time   CALCIUM 9.5 05/07/2023 1140   ALKPHOS 78 05/07/2023 1140   AST 13 (L) 05/07/2023 1140   ALT 8 05/07/2023 1140   BILITOT 1.1 05/07/2023 1140  RADIOGRAPHIC STUDIES: CT CHEST ABDOMEN PELVIS W CONTRAST Result Date: 05/10/2023 CLINICAL DATA:  Non-small cell lung cancer restaging * Tracking Code: BO * EXAM: CT CHEST, ABDOMEN, AND PELVIS WITH CONTRAST TECHNIQUE: Multidetector CT imaging of the chest, abdomen and pelvis was performed following the standard protocol during bolus administration of intravenous contrast. RADIATION DOSE REDUCTION: This exam was performed according to the departmental dose-optimization program which includes automated exposure control, adjustment of the mA and/or kV according to patient size and/or use of iterative reconstruction technique. CONTRAST:   OMNIPAQUE IOHEXOL 300 MG/ML  SOLN COMPARISON:  11/11/2022 FINDINGS: CT CHEST FINDINGS Cardiovascular: Right chest port catheter. Aortic atherosclerosis. 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 Normal heart size. Left and right coronary artery calcifications. Enlargement of the main pulmonary artery measuring up to 3.9 cm in caliber. No pericardial effusion. Mediastinum/Nodes: No enlarged mediastinal, hilar, or axillary lymph nodes. Thyroid gland, trachea, and esophagus demonstrate no significant findings. Lungs/Pleura: Severe emphysema. Unchanged elevation of the left hemidiaphragm with associated scarring or atelectasis. No pleural effusion or pneumothorax. Musculoskeletal: No chest wall abnormality. No acute osseous findings. CT ABDOMEN PELVIS FINDINGS Hepatobiliary: No suspicious liver lesions. Status post cholecystectomy. Unchanged mild postoperative biliary ductal dilatation. Pancreas: Unremarkable. No pancreatic ductal dilatation or surrounding inflammatory changes. Spleen: Normal in size without significant abnormality. Adrenals/Urinary Tract: Adrenal glands are unremarkable. Kidneys are normal, without renal calculi, solid lesion, or hydronephrosis. Bladder is unremarkable. Stomach/Bowel: Stomach is within normal limits. Appendix not clearly visualized. No evidence of bowel wall thickening, distention, or inflammatory changes. Sigmoid diverticulosis. Vascular/Lymphatic: Severe aortic atherosclerosis. 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 No enlarged abdominal or pelvic lymph nodes. Reproductive: Status post hysterectomy. Unchanged cystic lesion centered in the left hemipelvis, again measuring 7.8 x 6.8  cm (series 2, image 86). Other: No abdominal wall hernia or abnormality. No ascites. Musculoskeletal: No acute osseous findings. Please note that definitively benign incidental findings, functional findings, and anatomic variants may have been intentionally omitted from this report in the interest of brevity and clarity. If not specifically noted and recommended in the impression, no further follow-up or characterization is required for incidental findings. IMPRESSION: 1. No evidence of recurrent or metastatic disease in the chest, abdomen, or pelvis. 2. Unchanged cystic lesion centered in the left hemipelvis, again measuring 7.8 x 6.8 cm. This is of uncertain etiology and significance. Continued attention on follow-up in the setting of other known primary malignancy. 3. 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 4. Enlargement of the main pulmonary artery, as can be seen in pulmonary hypertension. 5. Emphysema. 6. Coronary artery disease. Aortic Atherosclerosis (ICD10-I70.0) and Emphysema (ICD10-J43.9). Electronically Signed   By: Jearld Lesch M.D.   On: 05/10/2023 15:12    ASSESSMENT AND PLAN: This is a very pleasant 78 years old African-American female with stage IV non-small cell carcinoma,, squamous cell carcinoma diagnosed in September 2020.  She presented with extensive right-sided pleural and thoracic nodal hypermetabolic disease  with no extrathoracic disease. The patient started induction treatment with systemic chemotherapy with carboplatin, paclitaxel and Keytruda status post 4 cycles with partial response after cycle #4.  This was followed by 14 cycles of maintenance treatment with single agent Keytruda discontinued secondary to  disease progression. The patient underwent second line systemic chemotherapy with docetaxel 75 mg/M2 and Cyramza 10 mg/KG every 3 weeks with Neulasta support.  Status post 24 cycles.  Starting from cycle #10 her dose of docetaxel was reduced to 65 Mg/M2. Her treatment has been on hold for the last 18 months secondary to pancolitis seen on previous imaging studies in February 2023. The patient had repeat CT scan of the chest, abdomen and pelvis performed recently.  I personally and independently reviewed the scan and discussed the result with the patient and her husband today.  Her scan showed no evidence of recurrent or metastatic disease in the chest, abdomen or pelvis. Assessment and Plan    Stage IV Non-Small Cell Lung Cancer (NSCLC), Squamous Cell Carcinoma Diagnosed in September 2020. Initially treated with carboplatin, paclitaxel, and pembrolizumab. Switched to docetaxel and ramucirumab after disease progression. Completed 24 cycles, stopped in March 2023 due to colitis. Currently under observation with no evidence of recurrent or metastatic disease. Reports occasional abdominal pain, nausea, and diarrhea, likely diet-related. Recent scan shows no new growth or metastasis. - Continue observation - Schedule follow-up in six months - Perform lab tests and scan one week before next visit  Colitis Inflammation of the colon, previously caused discontinuation of cancer treatment in March 2023. Reports occasional diarrhea, up to three to four times daily, potentially related to dietary intake including coffee and cream. - Advise dietary modifications to manage diarrhea  Fusiform Aneurysm of the Upper Abdominal Aorta Unchanged fusiform aneurysm measuring 5.0 x 4.0 cm. No evidence of growth or complications. - Continue monitoring  General Health Maintenance Overall health is well-managed. No new issues identified. - Encourage maintaining current health status - Advise regular follow-ups.   Regarding the thoracic aneurysm, she is followed by vascular surgery. The patient was advised to call immediately if she has any concerning symptoms in the interval.  The patient voices understanding of current disease status and treatment options and is in agreement with the current care plan. All questions were answered. The patient knows to call the clinic with any problems, questions or concerns. We can certainly see the patient much sooner if necessary.   Disclaimer: This note was dictated with voice recognition software. Similar sounding words can inadvertently be transcribed and may not be corrected upon review.

## 2023-05-20 ENCOUNTER — Other Ambulatory Visit: Payer: Self-pay | Admitting: Student in an Organized Health Care Education/Training Program

## 2023-06-16 ENCOUNTER — Ambulatory Visit: Payer: Self-pay | Admitting: Internal Medicine

## 2023-06-16 ENCOUNTER — Other Ambulatory Visit: Payer: Self-pay | Admitting: Student in an Organized Health Care Education/Training Program

## 2023-06-16 NOTE — Telephone Encounter (Signed)
 Patient last seen 11/27/22 I called the patient to schedule a follow up appointment, I was unable to reach her I left a voicemail for her to give [place] a call back.

## 2023-06-16 NOTE — Telephone Encounter (Signed)
  Chief Complaint: headache  Symptoms: headache  Disposition: [] ED /[x] Urgent Care (no appt availability in office) / [] Appointment(In office/virtual)/ []  [place] [facility]/ [] Home Care/ [] Refused Recommended Disposition /[] [place] [facility]/ []  Follow-up with PCP Additional Notes: Pt calling with headache on right side; face/ear pain. Pt stated the headache comes and goes for over a week now. Ibuprofen relieves it but it comes back.  Pt rated pain 8/10. Current BP of 140/74. Pt denies blurred vision, SOB, dizziness. Pt states she's has been taking Claritin for allergies. RN advised pt to go to UC today and be seen due to high pain level. Pt verbalized understanding but it is not clear if pt will go.           Copied from CRM [id number]. Topic: Clinical - Red Word Triage >> Jun 16, 2023  4:25 PM Irine Seal wrote: [facility] that prompted transfer to Nurse Triage: Patient reports a severe headache (10/10) for the past week, primarily on the left side, with ear pressure that feels like the ears are going to pop. Ibuprofen provides temporary relief, but the pain returns. Requesting acute visit. Reason for Disposition  [1] SEVERE headache (e.g., excruciating) AND [2] not improved after 2 hours of pain medicine  Answer Assessment - Initial Assessment Questions 1. LOCATION: "Where does it hurt?"      Right side of face/head/ear  2. ONSET: "When did the headache start?" (Minutes, hours or days)      Over a week  3. PATTERN: "Does the pain come and go, or has it been constant since it started?"     Comes and goes  4. SEVERITY: "How bad is the pain?" and "What does it keep you from doing?"  (e.g., Scale 1-10; mild, moderate, or severe)   - MILD (1-3): doesn't interfere with normal activities    - MODERATE (4-7): interferes with normal activities or awakens from sleep    - SEVERE (8-10): excruciating pain, unable to do any normal activities        8 5. RECURRENT SYMPTOM: "Have you ever had  headaches before?" If Yes, ask: "When was the last time?" and "What happened that time?"      No  6. CAUSE: "What do you think is causing the headache?"     No  7. MIGRAINE: "Have you been diagnosed with migraine headaches?" If Yes, ask: "Is this headache similar?"      Not sure  8. HEAD INJURY: "Has there been any recent injury to the head?"      Over a year  9. OTHER SYMPTOMS: "Do you have any other symptoms?" (fever, stiff neck, eye pain, sore throat, cold symptoms)     Little neck pain  Protocols used: Headache-A-AH

## 2023-06-19 ENCOUNTER — Encounter: Payer: Self-pay | Admitting: Student

## 2023-06-19 ENCOUNTER — Other Ambulatory Visit: Payer: Self-pay | Admitting: Student

## 2023-06-19 ENCOUNTER — Ambulatory Visit: Payer: Self-pay | Admitting: Student

## 2023-06-19 VITALS — BP 128/73 | HR 66 | Temp 98.3°F | Wt 143.0 lb

## 2023-06-19 DIAGNOSIS — G43819 Other migraine, intractable, without status migrainosus: Secondary | ICD-10-CM

## 2023-06-19 DIAGNOSIS — M26622 Arthralgia of left temporomandibular joint: Secondary | ICD-10-CM

## 2023-06-19 DIAGNOSIS — M26629 Arthralgia of temporomandibular joint, unspecified side: Secondary | ICD-10-CM

## 2023-06-19 MED ORDER — SUMATRIPTAN SUCCINATE 50 MG PO TABS: 50.0000 mg | ORAL_TABLET | Freq: Once | ORAL | 2 refills | Status: DC | PRN

## 2023-06-19 NOTE — Assessment & Plan Note (Addendum)
 Today she is seeking acute evaluation of reported 2-week history of left ear pain as well as left head pain and jaw pain.  The pain is primarily in the left ear although she admits it is hard to truly localize it.  2 weeks of coming and going throbbing pain with associated tinnitus of the left ear.  Self treatment with NSAIDs and mucolytic's over-the-counter have not helped.  She thinks she might of had a cold at the same time all of this started.  She does not have photophobia.  She has a history of TMJ dysfunction and migraines.  She reports ear fullness.  On exam she has a mild burden of cerumen and the eardrum and canal are otherwise healthy.  Hearing is reduced bilaterally but with loud noises she can localize appropriately.  She is not exhibiting vertigo.  She has pain in the bilateral TMJs that is worse on the left -Differential TMJ versus migraine versus Mnire's disease versus labyrinthitis versus giant cell arteritis.  Course is less consistent with Triazole Roxanol vitals.  Ear fullness suggest Mnire's.  Left-sided pain and history of migraines could favor that there is a component of migraine to this.  Previous evaluations for similar issues revealed severe TMJ and she was recommended to be referred to a maxillofacial surgeon for consideration of injections, but this never happened. - Will refer to maxillofacial surgery - Can consider sumatriptan in the future

## 2023-06-19 NOTE — Progress Notes (Unsigned)
   CC: L sided ear, face, head pain x 2 weeks  HPI:  Debra Rodgers is a 78 y.o. female with a PMH stated below who presents today for an acute visit.  Today she is seeking acute evaluation of reported 2-week history of left ear pain as well as left head pain and jaw pain.  The pain is primarily in the left ear although she admits it is hard to truly localize it.  2 weeks of coming and going throbbing pain with associated tinnitus of the left ear.  Self treatment with NSAIDs and mucolytics over-the-counter have not helped.  She thinks she might of had a cold at the same time all of this started.  She does not have photophobia.  She has a history of TMJ dysfunction and migraines.  She reports ear fullness.    Please see problem based assessment and plan for additional details.  Past Medical History:  Diagnosis Date   AAA (abdominal aortic aneurysm) (HCC)    COPD (chronic obstructive pulmonary disease) (HCC)    Depression    DVT (deep venous thrombosis) (HCC) 07/12/2019   Encounter for antineoplastic chemotherapy 12/02/2018   Encounter for antineoplastic immunotherapy 12/02/2018   Essential hypertension    Goals of care, counseling/discussion 12/02/2018   MOST form completed 07/12/19:  DNR/DNI but full scope of usual care. Available in VYNCA   Headache    History of migraine headaches    lung ca dx'd 10/2018   Sleep apnea    Tobacco use disorder     Review of Systems: ROS negative except for what is noted on the assessment and plan.  Vitals:   06/19/23 1345  BP: 128/73  Pulse: 66  Temp: 98.3 F (36.8 C)  TempSrc: Oral  SpO2: 100%  Weight: 143 lb (64.9 kg)    Physical Exam: Constitutional: well-appearing woman in no acute distress HENT: normocephalic atraumatic, mucous membranes moist. Moderate cerumen burden in R ear canal but no blockage, eardrum and canal otherwise WNL. Hearing is reduced bilaterally but with snapping of fingers she can localize appropriately. Crepitus to  bilateral TMJs. Eyes: conjunctiva non-erythematous Cardiovascular: regular rate and rhythm, no m/r/g Pulmonary/Chest: normal work of breathing on room air, lungs clear to auscultation bilaterally MSK: normal bulk and tone Neurological: alert & oriented x 3. No gait disturbance. Skin: warm and dry Psych: normal mood and behavior  Assessment & Plan:   Patient discussed with Dr. Lafonda Mosses  Temporomandibular joint-pain-dysfunction syndrome (TMJ) See history above. Differential TMJ vs migraine vs Mnire's disease vs labyrinthitis vs giant cell arteritis. She has a history of the first two. Prolonged course is not consistent with GCA so I do not think she needs urgent steroids. 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 It looks like she was referred to Maxillofacial surgery in the past to consider TMJ steroid injections but the loop was not closed. - Will refer to maxillofacial surgery - Can trial sumatriptan in the future should her symptoms persist  RTC prn for this issue. She has an appointment with PCP Dr. Lafonda Mosses in May.  Debra Rodgers, D.O. [facility] Internal Medicine, PGY-1 Phone: [phone] Date 06/20/2023 Time 8:44 AM

## 2023-06-20 ENCOUNTER — Encounter: Payer: Self-pay | Admitting: Student

## 2023-06-20 NOTE — Progress Notes (Signed)
Internal Medicine Clinic Attending  I was physically present during the key portions of the resident provided service and participated in the medical decision making of patient's management care. I reviewed pertinent patient test results.  The assessment, diagnosis, and plan were formulated together and I agree with the documentation in the resident's note.  Mercie Eon, MD

## 2023-07-29 NOTE — Progress Notes (Unsigned)
 [facility]: Clinic Note  Subjective:  History of Present Illness: Debra Rodgers is a 78 y.o. year old female who presents for follow up of her chronic medical conditions. She was previously seeing Dr Gayl Katos and I met her briefly with Dr Diann Forth in March.  She is doing well overall. The pain in her jaw is improving, but she's been avoiding chewing on that side. She hasn't heard from the TMJ specialist yet.  She has an acute dry cough. Has been taking OTC robutussin with some relief.  We spent most of this visit reviewing her medication and problem list.  She is in good spirits overall. Has been staying busy baking cakes.     Please refer to Assessment and Plan below for full details in Problem-Based Charting.   Past Medical History:  Patient Active Problem List   Diagnosis Date Noted   Temporomandibular joint-pain-dysfunction syndrome (TMJ) 12/23/2022   Headache 12/08/2019   Port-A-Cath in place 07/07/2019   Cancer associated pain 12/30/2018   Stage IV squamous cell carcinoma of right lung (HCC) 12/02/2018   Abdominal aortic aneurysm (AAA) greater than 5.0 cm in diameter in female (HCC) 11/05/2018   BPPV (benign paroxysmal positional vertigo) 12/02/2016   COPD (chronic obstructive pulmonary disease) (HCC) 09/23/2016   OSA (obstructive sleep apnea) 10/02/2015   Glucose intolerance 10/02/2015   Adenomatous colon polyp 10/02/2015   Essential hypertension 04/17/2015   Major depressive disorder, recurrent episode (HCC) 04/17/2015   Osteoarthritis cervical spine 05/26/2013   GERD 06/25/2007   Hyperlipidemia 07/29/2006      Medications:  Current Outpatient Medications:    ibuprofen  (ADVIL ) 200 MG tablet, Take 2 tablets (400 mg total) by mouth daily as needed., Disp: , Rfl:    albuterol  (VENTOLIN  HFA) 108 (90 Base) MCG/ACT inhaler, Inhale 1-2 puffs into the lungs every 6 (six) hours as needed for wheezing or shortness of breath., Disp: 25.5 each, Rfl: 2    amLODipine  (NORVASC ) 10 MG tablet, TAKE 1 TABLET EVERY DAY, Disp: 90 tablet, Rfl: 3   atorvastatin  (LIPITOR) 20 MG tablet, TAKE 1 TABLET EVERY DAY, Disp: 90 tablet, Rfl: 3   chlorthalidone  (HYGROTON ) 25 MG tablet, TAKE 1 TABLET (25 MG TOTAL) BY MOUTH DAILY., Disp: 90 tablet, Rfl: 1   famotidine  (PEPCID ) 20 MG tablet, Take 20 mg by mouth daily. , Disp: , Rfl:    FLUoxetine  (PROZAC ) 20 MG capsule, TAKE 1 CAPSULE EVERY DAY, Disp: 90 capsule, Rfl: 3   fluticasone  (FLONASE ) 50 MCG/ACT nasal spray, Place 1 spray into both nostrils daily., Disp: 16 each, Rfl: 1   lidocaine -prilocaine  (EMLA ) cream, Apply 1 application topically as needed., Disp: 30 g, Rfl: 1   polyvinyl alcohol (LIQUIFILM TEARS) 1.4 % ophthalmic solution, Place 1 drop into both eyes as needed for dry eyes., Disp: , Rfl:    rizatriptan (MAXALT) 10 MG tablet, Take 0.5 tablets (5 mg total) by mouth as needed for migraine., Disp: 10 tablet, Rfl: 0   Allergies: Allergies  Allergen Reactions   Codeine Sulfate     REACTION: "makes me high"   Pantoprazole Sodium Swelling and Rash    facial swelling       Objective:   Vitals: Vitals:   07/30/23 1050  BP: 126/76  Pulse: 70  Temp: 98 F (36.7 C)  SpO2: 98%     Physical Exam: Physical Exam Constitutional:      Appearance: Normal appearance.  Cardiovascular:     Rate and Rhythm: Normal rate and regular rhythm.  Pulmonary:     Effort: Pulmonary effort is normal. No respiratory distress.     Breath sounds: Wheezing present.  Neurological:     Mental Status: She is alert.  Psychiatric:        Mood and Affect: Mood normal.        Behavior: Behavior normal.      Data: Labs, imaging, and micro were reviewed in Epic. Refer to Assessment and Plan below for full details in Problem-Based Charting.  Assessment & Plan:  Hyperlipidemia - She is on Atorvastatin  20mg  daily for secondary prevention of ischemic events based on atherosclerosis seen on CT imaging - Will repeat lipid  panel today, then discuss with patient for shared decision making on treatment plan, keeping in mind that she is relatively stable with observation for her stage IV NSCLC  GERD - chronic and stable - continue famotidine  OTC daily prn  Essential hypertension - chronic and stable - continue amlodipine  10mg  daily & chlorthalidone  25mg  daily  - BMP today - she was hypokalemic with oncology office, so will keep an eye out for this  Major depressive disorder, recurrent episode (HCC) - chronic and stable - continue fluoxetine  20mg  daily - repeat PHQ9 at next visit  COPD (chronic obstructive pulmonary disease) (HCC) - chronic and stable - emphysema on CT imaging, with PFTs showing mild obstruction FEV1/FVC 0.71 - She is still somewhat limited by dyspnea on exertion - she walks slowly to clinic from parking lot.  - She did not previously like using daily spiriva  or incruse and politely declined PT & pulm rehab previously - Continue albuterol  prn for now and she will let me know if she'd like to add daily inhaler  Stage IV squamous cell carcinoma of right lung (HCC) - Managed by Dr Donn Fury with Oncology - She completed palliative immunotherapy and is now under observation with q6 months oncology visits, not on active treatment  Headache - Continue using ibuprofen  400mg  OTC daily prn sparingly, currently using this less than once a week  Temporomandibular joint-pain-dysfunction syndrome (TMJ) - She is doing to call her OMFS, the referral has been authorized      Patient will follow up in 6 months   Driscilla George, MD

## 2023-07-30 ENCOUNTER — Encounter: Payer: Self-pay | Admitting: Internal Medicine

## 2023-07-30 ENCOUNTER — Ambulatory Visit (INDEPENDENT_AMBULATORY_CARE_PROVIDER_SITE_OTHER): Admitting: Internal Medicine

## 2023-07-30 VITALS — BP 126/76 | HR 70 | Temp 98.0°F | Ht 62.0 in | Wt 139.7 lb

## 2023-07-30 DIAGNOSIS — J449 Chronic obstructive pulmonary disease, unspecified: Secondary | ICD-10-CM

## 2023-07-30 DIAGNOSIS — E785 Hyperlipidemia, unspecified: Secondary | ICD-10-CM | POA: Diagnosis not present

## 2023-07-30 DIAGNOSIS — M26629 Arthralgia of temporomandibular joint, unspecified side: Secondary | ICD-10-CM

## 2023-07-30 DIAGNOSIS — C3491 Malignant neoplasm of unspecified part of right bronchus or lung: Secondary | ICD-10-CM | POA: Diagnosis not present

## 2023-07-30 DIAGNOSIS — R519 Headache, unspecified: Secondary | ICD-10-CM

## 2023-07-30 DIAGNOSIS — J439 Emphysema, unspecified: Secondary | ICD-10-CM

## 2023-07-30 DIAGNOSIS — K219 Gastro-esophageal reflux disease without esophagitis: Secondary | ICD-10-CM | POA: Diagnosis not present

## 2023-07-30 DIAGNOSIS — I10 Essential (primary) hypertension: Secondary | ICD-10-CM

## 2023-07-30 DIAGNOSIS — F330 Major depressive disorder, recurrent, mild: Secondary | ICD-10-CM

## 2023-07-30 MED ORDER — IBUPROFEN 200 MG PO TABS: 400.0000 mg | ORAL_TABLET | Freq: Every day | ORAL | Status: AC | PRN

## 2023-07-30 MED ORDER — ALBUTEROL SULFATE HFA 108 (90 BASE) MCG/ACT IN AERS: 1.0000 | INHALATION_SPRAY | Freq: Four times a day (QID) | RESPIRATORY_TRACT | 2 refills | Status: AC | PRN

## 2023-07-30 MED ORDER — FLUTICASONE PROPIONATE 50 MCG/ACT NA SUSP: 1.0000 | Freq: Every day | NASAL | 1 refills | Status: AC

## 2023-07-30 NOTE — Assessment & Plan Note (Signed)
-   Managed by Dr Donn Fury with Oncology - She completed palliative immunotherapy and is now under observation with q6 months oncology visits, not on active treatment

## 2023-07-30 NOTE — Assessment & Plan Note (Signed)
-   She is on Atorvastatin  20mg  daily for secondary prevention of ischemic events based on atherosclerosis seen on CT imaging - Will repeat lipid panel today, then discuss with patient for shared decision making on treatment plan, keeping in mind that she is relatively stable with observation for her stage IV NSCLC

## 2023-07-30 NOTE — Assessment & Plan Note (Addendum)
-   Continue using ibuprofen  400mg  OTC daily prn sparingly, currently using this less than once a week

## 2023-07-30 NOTE — Assessment & Plan Note (Signed)
-   She is doing to call her OMFS, the referral has been authorized

## 2023-07-30 NOTE — Assessment & Plan Note (Signed)
-   chronic and stable - continue famotidine  OTC daily prn

## 2023-07-30 NOTE — Assessment & Plan Note (Signed)
-   chronic and stable - emphysema on CT imaging, with PFTs showing mild obstruction FEV1/FVC 0.71 - She is still somewhat limited by dyspnea on exertion - she walks slowly to clinic from parking lot.  - She did not previously like using daily spiriva  or incruse and politely declined PT & pulm rehab previously - Continue albuterol  prn for now and she will let me know if she'd like to add daily inhaler

## 2023-07-30 NOTE — Patient Instructions (Addendum)
 Thank you, Debra Rodgers for allowing us  to provide your care today. Today we discussed your TMJ, your cancer, your breathing, and your blood pressure.    I have ordered the following labs for you:   Lab Orders         BMP8+Anion Gap         Lipid Profile        Referrals ordered today:   Referral Orders  No referral(s) requested today       Return in about 6 months (around 01/30/2024) for Chronic medical conditions.    Remember:  - If you have any more shortness of breath, please call us  and I will start you on a daily inhaler - Please call the Dentist to schedule your appointment for TMJ since you haven't heard from them yet:  [facility] Pearlene Bouchard DDS PA [address] [place], [place]  [address] Phone: [phone]  Should you have any questions or concerns please call the internal medicine clinic at [phone].     Alastair Hennes, MD Faculty, Internal Medicine Teaching Progam [facility]

## 2023-07-30 NOTE — Assessment & Plan Note (Signed)
-   chronic and stable - continue amlodipine  10mg  daily & chlorthalidone  25mg  daily  - BMP today - she was hypokalemic with oncology office, so will keep an eye out for this

## 2023-07-30 NOTE — Assessment & Plan Note (Signed)
-   chronic and stable - continue fluoxetine  20mg  daily - repeat PHQ9 at next visit

## 2023-07-31 ENCOUNTER — Other Ambulatory Visit: Payer: Self-pay | Admitting: Internal Medicine

## 2023-07-31 LAB — BMP8+ANION GAP
Anion Gap: 16 mmol/L (ref 10.0–18.0)
BUN/Creatinine Ratio: 31 — ABNORMAL HIGH (ref 12–28)
BUN: 26 mg/dL (ref 8–27)
CO2: 28 mmol/L (ref 20–29)
Calcium: 9.5 mg/dL (ref 8.7–10.3)
Chloride: 95 mmol/L — ABNORMAL LOW (ref 96–106)
Creatinine, Ser: 0.84 mg/dL (ref 0.57–1.00)
Glucose: 89 mg/dL (ref 70–99)
Potassium: 3.2 mmol/L — ABNORMAL LOW (ref 3.5–5.2)
Sodium: 139 mmol/L (ref 134–144)
eGFR: 72 mL/min/{1.73_m2} (ref >59)

## 2023-07-31 LAB — LIPID PANEL
Chol/HDL Ratio: 2.4 ratio (ref 0.0–4.4)
Cholesterol, Total: 201 mg/dL — ABNORMAL HIGH (ref 100–199)
HDL: 84 mg/dL (ref >39)
LDL Chol Calc (NIH): 107 mg/dL — ABNORMAL HIGH (ref 0–99)
Triglycerides: 52 mg/dL (ref 0–149)
VLDL Cholesterol Cal: 10 mg/dL (ref 5–40)

## 2023-07-31 MED ORDER — POTASSIUM CHLORIDE CRYS ER 20 MEQ PO TBCR: 20.0000 meq | EXTENDED_RELEASE_TABLET | Freq: Every day | ORAL | 0 refills | Status: DC

## 2023-07-31 MED ORDER — CHLORTHALIDONE 25 MG PO TABS: 12.5000 mg | ORAL_TABLET | Freq: Every day | ORAL | 3 refills | Status: DC

## 2023-07-31 NOTE — Progress Notes (Signed)
 See result note.

## 2023-08-13 ENCOUNTER — Other Ambulatory Visit: Payer: Self-pay | Admitting: Student in an Organized Health Care Education/Training Program

## 2023-08-13 DIAGNOSIS — I10 Essential (primary) hypertension: Secondary | ICD-10-CM

## 2023-08-13 NOTE — Telephone Encounter (Signed)
 Medication sent to pharmacy

## 2023-08-28 ENCOUNTER — Other Ambulatory Visit: Payer: Self-pay | Admitting: Internal Medicine

## 2023-08-28 DIAGNOSIS — Z1231 Encounter for screening mammogram for malignant neoplasm of breast: Secondary | ICD-10-CM

## 2023-09-16 ENCOUNTER — Other Ambulatory Visit: Payer: Self-pay

## 2023-09-17 MED ORDER — CHLORTHALIDONE 25 MG PO TABS: 12.5000 mg | ORAL_TABLET | Freq: Every day | ORAL | 3 refills | Status: DC

## 2023-09-25 ENCOUNTER — Ambulatory Visit
Admission: RE | Admit: 2023-09-25 | Discharge: 2023-09-25 | Disposition: A | Discharge status: Home / Self Care | Source: Ambulatory Visit | Attending: Internal Medicine | Admitting: Internal Medicine

## 2023-09-25 DIAGNOSIS — Z1231 Encounter for screening mammogram for malignant neoplasm of breast: Secondary | ICD-10-CM

## 2023-10-08 ENCOUNTER — Encounter: Payer: Self-pay | Admitting: *Deleted

## 2023-11-03 ENCOUNTER — Inpatient Hospital Stay: Payer: Medicare HMO | Attending: Physician Assistant

## 2023-11-03 ENCOUNTER — Encounter (HOSPITAL_COMMUNITY): Payer: Self-pay

## 2023-11-03 ENCOUNTER — Ambulatory Visit (HOSPITAL_COMMUNITY)
Admission: RE | Admit: 2023-11-03 | Discharge: 2023-11-03 | Disposition: A | Discharge status: Home / Self Care | Source: Ambulatory Visit | Attending: Internal Medicine | Admitting: Internal Medicine

## 2023-11-03 DIAGNOSIS — Z87891 Personal history of nicotine dependence: Secondary | ICD-10-CM | POA: Insufficient documentation

## 2023-11-03 DIAGNOSIS — I700 Atherosclerosis of aorta: Secondary | ICD-10-CM | POA: Diagnosis not present

## 2023-11-03 DIAGNOSIS — C3411 Malignant neoplasm of upper lobe, right bronchus or lung: Secondary | ICD-10-CM | POA: Insufficient documentation

## 2023-11-03 DIAGNOSIS — J439 Emphysema, unspecified: Secondary | ICD-10-CM | POA: Diagnosis not present

## 2023-11-03 DIAGNOSIS — C3490 Malignant neoplasm of unspecified part of unspecified bronchus or lung: Secondary | ICD-10-CM

## 2023-11-03 LAB — CBC WITH DIFFERENTIAL (CANCER CENTER ONLY)
Abs Immature Granulocytes: 0.01 K/uL (ref 0.00–0.07)
Basophils Absolute: 0 K/uL (ref 0.0–0.1)
Basophils Relative: 1 %
Eosinophils Absolute: 0.1 K/uL (ref 0.0–0.5)
Eosinophils Relative: 5 %
HCT: 38.8 % (ref 36.0–46.0)
Hemoglobin: 13.4 g/dL (ref 12.0–15.0)
Immature Granulocytes: 0 %
Lymphocytes Relative: 47 %
Lymphs Abs: 1.2 K/uL (ref 0.7–4.0)
MCH: 32.1 pg (ref 26.0–34.0)
MCHC: 34.5 g/dL (ref 30.0–36.0)
MCV: 93 fL (ref 80.0–100.0)
Monocytes Absolute: 0.3 K/uL (ref 0.1–1.0)
Monocytes Relative: 11 %
Neutro Abs: 0.9 K/uL — ABNORMAL LOW (ref 1.7–7.7)
Neutrophils Relative %: 36 %
Platelet Count: 180 K/uL (ref 150–400)
RBC: 4.17 MIL/uL (ref 3.87–5.11)
RDW: 13.2 % (ref 11.5–15.5)
WBC Count: 2.6 K/uL — ABNORMAL LOW (ref 4.0–10.5)
nRBC: 0 % (ref 0.0–0.2)

## 2023-11-03 LAB — CMP (CANCER CENTER ONLY)
ALT: 11 U/L (ref 0–44)
AST: 17 U/L (ref 15–41)
Albumin: 4.3 g/dL (ref 3.5–5.0)
Alkaline Phosphatase: 61 U/L (ref 38–126)
Anion gap: 7 (ref 5–15)
BUN: 18 mg/dL (ref 8–23)
CO2: 32 mmol/L (ref 22–32)
Calcium: 9.5 mg/dL (ref 8.9–10.3)
Chloride: 100 mmol/L (ref 98–111)
Creatinine: 0.69 mg/dL (ref 0.44–1.00)
GFR, Estimated: >60 mL/min (ref >60)
Glucose, Bld: 93 mg/dL (ref 70–99)
Potassium: 3.1 mmol/L — ABNORMAL LOW (ref 3.5–5.1)
Sodium: 139 mmol/L (ref 135–145)
Total Bilirubin: 1.5 mg/dL — ABNORMAL HIGH (ref 0.0–1.2)
Total Protein: 7.2 g/dL (ref 6.5–8.1)

## 2023-11-03 MED ORDER — IOHEXOL 300 MG/ML  SOLN
100.0000 mL | Freq: Once | INTRAMUSCULAR | Status: AC | PRN
  Administered 2023-11-03 (×2): 100 mL via INTRAVENOUS

## 2023-11-03 MED ORDER — SODIUM CHLORIDE (PF) 0.9 % IJ SOLN
INTRAMUSCULAR | Status: AC
  Filled 2023-11-03: qty 50

## 2023-11-12 NOTE — Progress Notes (Unsigned)
 [facility] OFFICE PROGRESS NOTE  Shawn Sick, MD [address] [place] [address]  DIAGNOSIS:  Stage IV non-small cell lung cancer, squamous cell carcinoma. She presented with right upper lobe lung mass in addition to pleural-based metastasis and mediastinal lymphadenopathy. She was diagnosed in September 2020.    PRIOR THERAPY: 1) Chemotherapy with carboplatin  for an AUC of 5, paclitaxel  175 mg/m, and Keytruda  200 mg IV every 3 weeks with Neulasta  support. Last dose 12/08/19. Status post 18 cycles.  Starting from cycle #5 was on maintenance treatment with single agent Keytruda  every 3 weeks. This was discontinued due to evidence of disease progression.   2)  Palliative systemic chemotherapy with docetaxel  75 mg/m2 and Cyramza  10 mg/kg IV every 3 weeks with Neulasta  support. First dose on 01/04/20.  Status post 24 cycles.  Starting from cycle #10 docetaxel  was reduced to 65 Mg/M2.  Her treatment is currently on hold since March 2023 secondary to intolerance and pancolitis   CURRENT THERAPY: Observation   INTERVAL HISTORY: Debra Rodgers 78 y.o. female returns to the clinic today for a follow-up visit.  The patient was last seen by Dr. Sherrod 6 months ago in February 2025.  The patient has stage IV lung cancer.  She had some intolerance to treatment with pancolitis with her most recent chemotherapy and this was placed on hold since March 2025.  She has been on observation since that time and feeling fine.    She experienced a fall last week due to tripping after turning around too fast, resulting in a loss of balance. Her balance is otherwise unchanged from her baseline.  Today she denies any fever, chills, or unexplained weight loss.  She reports baseline night sweats. Her breathing is fine. She has stable dyspnea on exertion. She denies significant cough. If she does cough, her phlegm is clear.  She denies any chest pain or hemoptysis. She denies any nausea, vomiting,  or constipation. She does have chronic diarrhea. She does not use imodium. She has diarrhea 2-3x per day. She was seen by vascular in the past for aneurysm. She denies any headache or visual changes.  She reports she has not had her port flushed recently.  It looks like her potassium was low on her labs recently.  She is not taking potassium.  She recently had a restaging CT scan.  She is here today for evaluation and to review her scan results.   MEDICAL HISTORY: Past Medical History:  Diagnosis Date   AAA (abdominal aortic aneurysm) (HCC)    COPD (chronic obstructive pulmonary disease) (HCC)    Depression    DVT (deep venous thrombosis) (HCC) 07/12/2019   Encounter for antineoplastic chemotherapy 12/02/2018   Encounter for antineoplastic immunotherapy 12/02/2018   Essential hypertension    Goals of care, counseling/discussion 12/02/2018   MOST form completed 07/12/19:  DNR/DNI but full scope of usual care. Available in [facility]   Headache    History of migraine headaches    lung ca dx'd 10/2018   Sleep apnea    Tobacco use disorder     ALLERGIES:  is allergic to codeine sulfate and pantoprazole sodium.  MEDICATIONS:  Current Outpatient Medications  Medication Sig Dispense Refill   albuterol  (VENTOLIN  HFA) 108 (90 Base) MCG/ACT inhaler Inhale 1-2 puffs into the lungs every 6 (six) hours as needed for wheezing or shortness of breath. 25.5 each 2   amLODipine  (NORVASC ) 10 MG tablet TAKE 1 TABLET EVERY DAY 90 tablet 3  atorvastatin  (LIPITOR) 20 MG tablet TAKE 1 TABLET EVERY DAY 90 tablet 3   chlorthalidone  (HYGROTON ) 25 MG tablet Take 0.5 tablets (12.5 mg total) by mouth daily. 45 tablet 3   famotidine  (PEPCID ) 20 MG tablet Take 20 mg by mouth daily.      FLUoxetine  (PROZAC ) 20 MG capsule TAKE 1 CAPSULE EVERY DAY 90 capsule 3   fluticasone  (FLONASE ) 50 MCG/ACT nasal spray Place 1 spray into both nostrils daily. 16 each 1   ibuprofen  (ADVIL ) 200 MG tablet Take 2 tablets (400 mg total) by mouth  daily as needed.     lidocaine -prilocaine  (EMLA ) cream Apply 1 application topically as needed. 30 g 1   polyvinyl alcohol (LIQUIFILM TEARS) 1.4 % ophthalmic solution Place 1 drop into both eyes as needed for dry eyes.     potassium chloride  SA (KLOR-CON  M) 20 MEQ tablet Take 1 tablet (20 mEq total) by mouth daily for 7 days. 7 tablet 0   rizatriptan (MAXALT) 10 MG tablet Take 0.5 tablets (5 mg total) by mouth as needed for migraine. 10 tablet 0   No current facility-administered medications for this visit.    SURGICAL HISTORY:  Past Surgical History:  Procedure Laterality Date   ABDOMINAL HYSTERECTOMY     CHOLECYSTECTOMY     IR IMAGING GUIDED PORT INSERTION  02/24/2019   ROTATOR CUFF REPAIR  3/04   VIDEO BRONCHOSCOPY WITH ENDOBRONCHIAL ULTRASOUND Right 11/25/2018   Procedure: VIDEO BRONCHOSCOPY WITH ENDOBRONCHIAL ULTRASOUND;  Surgeon: Shelah Lamar RAMAN, MD;  Location: [facility] OR;  Service: Thoracic;  Laterality: Right;    REVIEW OF SYSTEMS:   Review of Systems  Constitutional: Negative for appetite change, chills, fatigue, fever and unexpected weight change.  HENT: Negative for mouth sores, nosebleeds, sore throat and trouble swallowing.   Eyes: Negative for eye problems and icterus.  Respiratory: Negative for cough, hemoptysis, shortness of breath and wheezing.   Cardiovascular: Negative for chest pain and leg swelling.  Gastrointestinal: + chronic diarrhea.  Negative for abdominal pain, constipation, nausea and vomiting.  Genitourinary: Negative for bladder incontinence, difficulty urinating, dysuria, frequency and hematuria.   Musculoskeletal: Negative for back pain, gait problem, neck pain and neck stiffness.  Skin: Negative for itching and rash.  Neurological: Negative for dizziness, extremity weakness, gait problem, headaches, light-headedness and seizures.  Hematological: Negative for adenopathy. Does not bruise/bleed easily.  Psychiatric/Behavioral: Negative for confusion, depression  and sleep disturbance. The patient is not nervous/anxious.     PHYSICAL EXAMINATION:  There were no vitals taken for this visit.  ECOG PERFORMANCE STATUS: 1  Physical Exam  Constitutional: Oriented to person, place, and time and well-developed, well-nourished, and in no distress.  HENT:  Head: Normocephalic and atraumatic.  Mouth/Throat: Oropharynx is clear and moist. No oropharyngeal exudate.  Eyes: Conjunctivae are normal. Right eye exhibits no discharge. Left eye exhibits no discharge. No scleral icterus.  Neck: Normal range of motion. Neck supple.  Cardiovascular: Normal rate, regular rhythm, normal heart sounds and intact distal pulses.   Pulmonary/Chest: Effort normal and breath sounds normal. No respiratory distress. No wheezes. No rales.  Abdominal: Soft. Bowel sounds are normal. Exhibits no distension and no mass. There is no tenderness.  Musculoskeletal: Normal range of motion. Exhibits no edema.  Lymphadenopathy:    No cervical adenopathy.  Neurological: Alert and oriented to person, place, and time. Exhibits wasting.  She ambulates with a cane.  Skin: Skin is warm and dry. No rash noted. Not diaphoretic. No erythema. No pallor.  Psychiatric: Mood, memory  and judgment normal.  Vitals reviewed.  LABORATORY DATA: Lab Results  Component Value Date   WBC 2.6 (L) 11/03/2023   HGB 13.4 11/03/2023   HCT 38.8 11/03/2023   MCV 93.0 11/03/2023   PLT 180 11/03/2023      Chemistry      Component Value Date/Time   NA 139 11/03/2023 1006   NA 139 07/30/2023 1126   K 3.1 (L) 11/03/2023 1006   CL 100 11/03/2023 1006   CO2 32 11/03/2023 1006   BUN 18 11/03/2023 1006   BUN 26 07/30/2023 1126   CREATININE 0.69 11/03/2023 1006   CREATININE 0.79 09/03/2013 1540      Component Value Date/Time   CALCIUM  9.5 11/03/2023 1006   ALKPHOS 61 11/03/2023 1006   AST 17 11/03/2023 1006   ALT 11 11/03/2023 1006   BILITOT 1.5 (H) 11/03/2023 1006       RADIOGRAPHIC STUDIES:  No  results found.   ASSESSMENT/PLAN:  This is a very pleasant 78 year old African-American female with stage IV non-small cell lung cancer, squamous cell carcinoma.  She was diagnosed in September 2020.  She presented with extensive right-sided pleural and thoracic nodal hypermetabolic disease with no extrathoracic disease.  He was started on treatment with systemic chemotherapy with carboplatin , paclitaxel , Keytruda .  She is status post 4 cycles with a partial response after cycle #4.  This was followed by 14 cycles of maintenance treatment with single agent Keytruda .  This was discontinued due to disease progression.  She then underwent second line systemic chemotherapy with docetaxel  75 mg/m and Cyramza  10 mg/kg IV every 3 weeks with Neulasta  support.  She was status post 24 cycles.  Starting from cycle #10 her dose of docetaxel  was reduced to 65 mg/m.  Her treatment has been on hold since imaging studies from February 2023 show pancolitis.  The patient recently had a restaging CT scan.  The patient was seen with Dr. Sherrod today.  Dr. Sherrod personally and independently reviewed the scan and discussed the results with the patient today.  The scan showed no evidence of disease progression.  The scan redemonstrated the aneurysm.  I will route a copy to her vascular surgeon.  It looks like she was seen by a specialist in 05/2022 to see if she was a candidate for intervention.  I will arrange for restaging CT scan of the chest to be performed in 6 months.  We will see her back for follow-up visit a few days later.  Will recheck her potassium today.  I will arrange for flush appointments every 6 to 8 weeks.  If her potassium is low I will send her some to the pharmacy.  The patient was advised to call immediately if she has any concerning symptoms in the interval. The patient voices understanding of current disease status and treatment options and is in agreement with the current care plan. All  questions were answered. The patient knows to call the clinic with any problems, questions or concerns. We can certainly see the patient much sooner if necessary    No orders of the defined types were placed in this encounter.    Debra Calbert L Danyon Mcginness, PA-C 11/12/23  ADDENDUM: Hematology/Oncology Attending: I had a face-to-face encounter with the patient today.  I reviewed her records, lab, scan and recommended her care plan.  This is a very pleasant 78 years old African-American female with a stage IV non-small cell lung cancer, squamous cell carcinoma diagnosed in September 2020 status post several treatment  in the past including systemic chemotherapy with carboplatin , paclitaxel  and Keytruda  and her treatment was discontinued in September 2021 secondary to disease progression.  She started palliative systemic chemotherapy with second line docetaxel  and ramucirumab  for a total of 24 cycles and this was discontinued secondary to intolerance and pancolitis.  She is currently on observation since March 2023.  The patient is feeling fine today with no concerning complaints except for baseline fatigue. She had repeat CT scan of the chest, abdomen and pelvis performed recently.  I personally independently reviewed the scan and discussed the results with the patient today.  Her scan showed no concerning findings for disease progression.  I recommended for her to continue on observation with repeat CT scan of the chest, abdomen and pelvis in 6 months. Aortic aneurysm, we will arrange for the patient to have a follow-up appointment with vascular surgery for further investigation or recommendation. She was advised to call immediately if she has any other concerning symptoms in the interval. The total time spent in the appointment was 30 minutes including review of chart and various tests results, discussions about plan of care and coordination of care plan . Disclaimer: This note was dictated with voice  recognition software. Similar sounding words can inadvertently be transcribed and may be missed upon review. Debra MARLA Sherrod, MD

## 2023-11-17 ENCOUNTER — Other Ambulatory Visit: Payer: Self-pay | Admitting: Physician Assistant

## 2023-11-17 ENCOUNTER — Inpatient Hospital Stay: Payer: Medicare HMO | Admitting: Physician Assistant

## 2023-11-17 ENCOUNTER — Inpatient Hospital Stay

## 2023-11-17 ENCOUNTER — Other Ambulatory Visit

## 2023-11-17 ENCOUNTER — Ambulatory Visit: Payer: Self-pay

## 2023-11-17 VITALS — BP 134/73 | HR 64 | Temp 98.3°F | Resp 17 | Ht 62.0 in | Wt 141.9 lb

## 2023-11-17 DIAGNOSIS — C3490 Malignant neoplasm of unspecified part of unspecified bronchus or lung: Secondary | ICD-10-CM

## 2023-11-17 DIAGNOSIS — Z95828 Presence of other vascular implants and grafts: Secondary | ICD-10-CM

## 2023-11-17 DIAGNOSIS — E876 Hypokalemia: Secondary | ICD-10-CM

## 2023-11-17 DIAGNOSIS — C3411 Malignant neoplasm of upper lobe, right bronchus or lung: Secondary | ICD-10-CM | POA: Diagnosis not present

## 2023-11-17 DIAGNOSIS — Z87891 Personal history of nicotine dependence: Secondary | ICD-10-CM | POA: Diagnosis not present

## 2023-11-17 LAB — BASIC METABOLIC PANEL - CANCER CENTER ONLY
Anion gap: 4 — ABNORMAL LOW (ref 5–15)
BUN: 17 mg/dL (ref 8–23)
CO2: 35 mmol/L — ABNORMAL HIGH (ref 22–32)
Calcium: 9.4 mg/dL (ref 8.9–10.3)
Chloride: 100 mmol/L (ref 98–111)
Creatinine: 0.65 mg/dL (ref 0.44–1.00)
GFR, Estimated: >60 mL/min (ref >60)
Glucose, Bld: 86 mg/dL (ref 70–99)
Potassium: 3.2 mmol/L — ABNORMAL LOW (ref 3.5–5.1)
Sodium: 139 mmol/L (ref 135–145)

## 2023-11-17 MED ORDER — POTASSIUM CHLORIDE CRYS ER 20 MEQ PO TBCR: 20.0000 meq | EXTENDED_RELEASE_TABLET | Freq: Every day | ORAL | 0 refills | Status: DC

## 2023-11-17 MED ORDER — SODIUM CHLORIDE 0.9% FLUSH
10.0000 mL | INTRAVENOUS | Status: DC | PRN
  Administered 2023-11-17: 10 mL

## 2023-11-17 NOTE — Telephone Encounter (Signed)
 Patient has been contacted and advised on prescription sent in for potassium. Patient voiced understanding.

## 2023-11-17 NOTE — Telephone Encounter (Signed)
-----   Message from Cassandra L Heilingoetter sent at 11/17/2023  1:08 PM EDT ----- Can you call her and let her know I send in a short course of potassium to take once daily for a few days? ----- Message ----- From: Interface, Lab In [place] Sent: 11/17/2023  11:53 AM EDT To: Calton CROME Heilingoetter, PA-C

## 2023-11-18 ENCOUNTER — Encounter: Payer: Self-pay | Admitting: Internal Medicine

## 2023-11-18 DIAGNOSIS — I2510 Atherosclerotic heart disease of native coronary artery without angina pectoris: Secondary | ICD-10-CM | POA: Diagnosis not present

## 2023-11-18 DIAGNOSIS — N182 Chronic kidney disease, stage 2 (mild): Secondary | ICD-10-CM | POA: Diagnosis not present

## 2023-11-18 DIAGNOSIS — G629 Polyneuropathy, unspecified: Secondary | ICD-10-CM | POA: Diagnosis not present

## 2023-11-18 DIAGNOSIS — Z9181 History of falling: Secondary | ICD-10-CM | POA: Diagnosis not present

## 2023-11-18 DIAGNOSIS — I130 Hypertensive heart and chronic kidney disease with heart failure and stage 1 through stage 4 chronic kidney disease, or unspecified chronic kidney disease: Secondary | ICD-10-CM | POA: Diagnosis not present

## 2023-11-18 DIAGNOSIS — R011 Cardiac murmur, unspecified: Secondary | ICD-10-CM | POA: Diagnosis not present

## 2023-11-18 DIAGNOSIS — I700 Atherosclerosis of aorta: Secondary | ICD-10-CM | POA: Diagnosis not present

## 2023-11-18 DIAGNOSIS — K219 Gastro-esophageal reflux disease without esophagitis: Secondary | ICD-10-CM | POA: Diagnosis not present

## 2023-11-18 DIAGNOSIS — I719 Aortic aneurysm of unspecified site, without rupture: Secondary | ICD-10-CM | POA: Diagnosis not present

## 2023-11-18 DIAGNOSIS — I509 Heart failure, unspecified: Secondary | ICD-10-CM | POA: Diagnosis not present

## 2023-11-18 DIAGNOSIS — R269 Unspecified abnormalities of gait and mobility: Secondary | ICD-10-CM | POA: Diagnosis not present

## 2023-11-18 DIAGNOSIS — F324 Major depressive disorder, single episode, in partial remission: Secondary | ICD-10-CM | POA: Diagnosis not present

## 2023-11-18 DIAGNOSIS — J449 Chronic obstructive pulmonary disease, unspecified: Secondary | ICD-10-CM | POA: Diagnosis not present

## 2023-11-18 DIAGNOSIS — G43909 Migraine, unspecified, not intractable, without status migrainosus: Secondary | ICD-10-CM | POA: Diagnosis not present

## 2023-11-18 DIAGNOSIS — E785 Hyperlipidemia, unspecified: Secondary | ICD-10-CM | POA: Diagnosis not present

## 2023-11-18 DIAGNOSIS — Z85118 Personal history of other malignant neoplasm of bronchus and lung: Secondary | ICD-10-CM | POA: Diagnosis not present

## 2023-11-19 ENCOUNTER — Other Ambulatory Visit: Payer: Self-pay

## 2023-11-19 ENCOUNTER — Ambulatory Visit

## 2023-11-19 ENCOUNTER — Encounter: Payer: Self-pay | Admitting: Student

## 2023-11-19 ENCOUNTER — Ambulatory Visit: Payer: Self-pay | Admitting: Student

## 2023-11-19 VITALS — BP 114/70 | HR 70 | Temp 98.8°F | Ht 62.0 in | Wt 142.4 lb

## 2023-11-19 DIAGNOSIS — Z0000 Encounter for general adult medical examination without abnormal findings: Secondary | ICD-10-CM

## 2023-11-19 DIAGNOSIS — M25561 Pain in right knee: Secondary | ICD-10-CM

## 2023-11-19 DIAGNOSIS — M25551 Pain in right hip: Secondary | ICD-10-CM | POA: Diagnosis not present

## 2023-11-19 DIAGNOSIS — W1830XA Fall on same level, unspecified, initial encounter: Secondary | ICD-10-CM

## 2023-11-19 DIAGNOSIS — W19XXXA Unspecified fall, initial encounter: Secondary | ICD-10-CM

## 2023-11-19 DIAGNOSIS — M25511 Pain in right shoulder: Secondary | ICD-10-CM | POA: Diagnosis not present

## 2023-11-19 NOTE — Progress Notes (Signed)
   CC: Acute visit - fall from standing last week  HPI:  Debra Rodgers is a 78 y.o. female with a PMH stated below who presents today for evaluation after a fall.  Please see problem based assessment and plan for additional details.  Past Medical History:  Diagnosis Date   AAA (abdominal aortic aneurysm) (HCC)    COPD (chronic obstructive pulmonary disease) (HCC)    Depression    DVT (deep venous thrombosis) (HCC) 07/12/2019   Encounter for antineoplastic chemotherapy 12/02/2018   Encounter for antineoplastic immunotherapy 12/02/2018   Essential hypertension    Goals of care, counseling/discussion 12/02/2018   MOST form completed 07/12/19:  DNR/DNI but full scope of usual care. Available in [facility]   Headache    History of migraine headaches    lung ca dx'd 10/2018   Sleep apnea    Tobacco use disorder     Review of Systems: ROS negative except for what is noted on the assessment and plan.  Vitals:   11/19/23 1336  BP: 114/70  Pulse: 70  Temp: 98.8 F (37.1 C)  TempSrc: Oral  SpO2: 94%  Weight: 142 lb 6.4 oz (64.6 kg)  Height: 5' 2 (1.575 m)    Physical Exam: Constitutional: well-appearing woman in no acute distress. Carries a cane but walks short distances without it. Cardiovascular: regular rate and rhythm, no m/r/g Pulmonary/Chest: normal work of breathing on room air, lungs clear to auscultation bilaterally Abdominal: soft, non-tender, non-distended MSK: normal bulk and tone. R upper and lower extremity without deformity, swelling, or skin changes. Strength and active ROM fully intact appropriate for her age. Point tenderness at joints very mild. Neurological: alert & oriented x 3, no focal deficit Skin: warm and dry Psych: normal mood and behavior  Assessment & Plan:   Patient discussed with Dr. Karna Stallion Fall from standing  Presents today for evaluation after a fall from standing Thursday of last week, 6 days ago.  Shares that she was moving about her  house, noticed a spider, saw it jump, moved quickly, and fell as a result.  There is no preceding dizziness or weakness or syncope.  She was on the ground for 15 minutes and her husband helped her up.  Since then she has some right sided pain.  She landed on her right side.  Pain is only moderate, easily relieved with ibuprofen , she takes 400 mg daily, with most prominent at her right hip but also the right knee and shoulder.  She presents with concern that her lingering pain means she has a fracture.  On examination there is no gross deformity, swelling, skin changes.  Strength and sensation are intact in the upper and lower R extremities.  Walks for me in the office and did very well.  Able to move unassisted and with good balance.  Exam is not consistent with any fracture or strain. Do not suspect that there was any severe underlying etiology to this fall such as an orthostatic, cardiogenic, syncopal event.  She is relieved to know that I do not suspect there is any broken bones.  She does not request additional pain control.  Her ibuprofen  regimen is low and reasonable for her.  We reviewed safety with ambulation and movements.  RTC as needed for this issue   Debra Rodgers, D.O. [facility] Internal Medicine, PGY-2 Phone: [phone] Date 11/19/2023 Time 2:47 PM

## 2023-11-19 NOTE — Progress Notes (Signed)
 Because this visit was a virtual/telehealth visit,  certain criteria was not obtained, such a blood pressure, CBG if applicable, and timed get up and go. Any medications not marked as taking were not mentioned during the medication reconciliation part of the visit. Any vitals not documented were not able to be obtained due to this being a telehealth visit or patient was unable to self-report a recent blood pressure reading due to a lack of equipment at home via telehealth. Vitals that have been documented are verbally provided by the patient.   Subjective:   Debra Rodgers is a 78 y.o. who presents for a Medicare Wellness preventive visit.  As a reminder, Annual Wellness Visits don't include a physical exam, and some assessments may be limited, especially if this visit is performed virtually. We may recommend an in-person follow-up visit with your provider if needed.  Visit Complete: In person  VideoDeclined- This patient declined Interactive audio and Acupuncturist. Therefore the visit was completed with audio only.  Persons Participating in Visit: Patient.  AWV Questionnaire: No: Patient Medicare AWV questionnaire was not completed prior to this visit.  Cardiac Risk Factors include: advanced age (>44men, >51 women);dyslipidemia;hypertension;family history of premature cardiovascular disease     Objective:    Today's Vitals   11/19/23 1402  BP: 114/70  Pulse: 70  Temp: 98.8 F (37.1 C)  SpO2: 94%  Weight: 142 lb 6.7 oz (64.6 kg)  Height: 5' 2 (1.575 m)  PainSc: 10-Worst pain ever  PainLoc: Generalized   Body mass index is 26.05 kg/m.     11/19/2023    2:19 PM 11/19/2023    1:38 PM 11/17/2023   10:30 AM 12/23/2022   11:08 AM 12/02/2022    8:18 AM 08/26/2022    2:35 PM 08/26/2022   11:01 AM  Advanced Directives  Does Patient Have a Medical Advance Directive? No No No No No No No  Would patient like information on creating a medical advance directive? No - Patient  declined No - Patient declined No - Patient declined No - Patient declined No - Patient declined No - Patient declined No - Patient declined    Current Medications (verified) Outpatient Encounter Medications as of 11/19/2023  Medication Sig   albuterol  (VENTOLIN  HFA) 108 (90 Base) MCG/ACT inhaler Inhale 1-2 puffs into the lungs every 6 (six) hours as needed for wheezing or shortness of breath.   amLODipine  (NORVASC ) 10 MG tablet TAKE 1 TABLET EVERY DAY   atorvastatin  (LIPITOR) 20 MG tablet TAKE 1 TABLET EVERY DAY   chlorthalidone  (HYGROTON ) 25 MG tablet Take 0.5 tablets (12.5 mg total) by mouth daily.   famotidine  (PEPCID ) 20 MG tablet Take 20 mg by mouth daily.    FLUoxetine  (PROZAC ) 20 MG capsule TAKE 1 CAPSULE EVERY DAY   fluticasone  (FLONASE ) 50 MCG/ACT nasal spray Place 1 spray into both nostrils daily.   ibuprofen  (ADVIL ) 200 MG tablet Take 2 tablets (400 mg total) by mouth daily as needed.   lidocaine -prilocaine  (EMLA ) cream Apply 1 application topically as needed.   polyvinyl alcohol (LIQUIFILM TEARS) 1.4 % ophthalmic solution Place 1 drop into both eyes as needed for dry eyes.   potassium chloride  SA (KLOR-CON  M) 20 MEQ tablet Take 1 tablet (20 mEq total) by mouth daily.   rizatriptan (MAXALT) 10 MG tablet Take 0.5 tablets (5 mg total) by mouth as needed for migraine.   No facility-administered encounter medications on file as of 11/19/2023.    Allergies (verified) Codeine sulfate and  Pantoprazole sodium   History: Past Medical History:  Diagnosis Date   AAA (abdominal aortic aneurysm) (HCC)    COPD (chronic obstructive pulmonary disease) (HCC)    Depression    DVT (deep venous thrombosis) (HCC) 07/12/2019   Encounter for antineoplastic chemotherapy 12/02/2018   Encounter for antineoplastic immunotherapy 12/02/2018   Essential hypertension    Goals of care, counseling/discussion 12/02/2018   MOST form completed 07/12/19:  DNR/DNI but full scope of usual care. Available in VYNCA    Headache    History of migraine headaches    lung ca dx'd 10/2018   Sleep apnea    Tobacco use disorder    Past Surgical History:  Procedure Laterality Date   ABDOMINAL HYSTERECTOMY     CHOLECYSTECTOMY     IR IMAGING GUIDED PORT INSERTION  02/24/2019   ROTATOR CUFF REPAIR  3/04   VIDEO BRONCHOSCOPY WITH ENDOBRONCHIAL ULTRASOUND Right 11/25/2018   Procedure: VIDEO BRONCHOSCOPY WITH ENDOBRONCHIAL ULTRASOUND;  Surgeon: Shelah Lamar RAMAN, MD;  Location: [facility] OR;  Service: Thoracic;  Laterality: Right;   Family History  Problem Relation Age of Onset   Hypertension Mother    Stroke Mother    Coronary artery disease Mother    Heart disease Father    Diabetes Sister    Hypertension Sister    Cancer Sister    Breast cancer Sister    Social History   Socioeconomic History   Marital status: Married    Spouse name: Not on file   Number of children: Not on file   Years of education: Not on file   Highest education level: Not on file  Occupational History   Not on file  Tobacco Use   Smoking status: Former    Current packs/day: 0.00    Average packs/day: 1 pack/day for 56.6 years (56.6 ttl pk-yrs)    Types: Cigarettes    Start date: 1964    Quit date: 11/04/2018    Years since quitting: 5.0   Smokeless tobacco: Never  Vaping Use   Vaping status: Never Used  Substance and Sexual Activity   Alcohol use: No    Alcohol/week: 0.0 standard drinks of alcohol   Drug use: No   Sexual activity: Never    Partners: Male  Other Topics Concern   Not on file  Social History Narrative   Married, Regular Exercise- yes   Social Drivers of Health   Financial Resource Strain: Low Risk  (11/19/2023)   Overall Financial Resource Strain (CARDIA)    Difficulty of Paying Living Expenses: Not hard at all  Food Insecurity: No Food Insecurity (11/19/2023)   Hunger Vital Sign    Worried About Running Out of Food in the Last Year: Never true    Ran Out of Food in the Last Year: Never true  Transportation  Needs: No Transportation Needs (11/19/2023)   PRAPARE - Administrator, Civil Service (Medical): No    Lack of Transportation (Non-Medical): No  Physical Activity: Sufficiently Active (11/19/2023)   Exercise Vital Sign    Days of Exercise per Week: 5 days    Minutes of Exercise per Session: 30 min  Stress: No Stress Concern Present (11/19/2023)   [employer] of Occupational Health - Occupational Stress Questionnaire    Feeling of Stress: Not at all  Social Connections: Moderately Isolated (11/19/2023)   Social Connection and Isolation Panel    Frequency of Communication with Friends and Family: More than three times a week    Frequency  of Social Gatherings with Friends and Family: Twice a week    Attends Religious Services: Never    Database administrator or Organizations: No    Attends Engineer, structural: Never    Marital Status: Married    Tobacco Counseling Counseling given: Not Answered    Clinical Intake:  Pre-visit preparation completed: Yes  Pain : 0-10 Pain Score: 10-Worst pain ever Pain Type: Acute pain Pain Location: Generalized Pain Orientation: Right Pain Descriptors / Indicators: Aching, Sore, Discomfort Pain Onset: In the past 7 days Pain Frequency: Constant Pain Relieving Factors: Ibuprofen   Pain Relieving Factors: Ibuprofen   BMI - recorded: 26.05 Nutritional Status: BMI 25 -29 Overweight Nutritional Risks: None Diabetes: No  Lab Results  Component Value Date   HGBA1C 5.4 02/05/2021   HGBA1C 5.9 (H) 10/13/2017   HGBA1C 5.8 (H) 09/23/2016     How often do you need to have someone help you when you read instructions, pamphlets, or other written materials from your doctor or pharmacy?: 1 - Never What is the last grade level you completed in school?: 9th grade  Interpreter Needed?: No  Information entered by :: Roz Fuller, LPN.   Activities of Daily Living     11/19/2023    2:19 PM 11/19/2023    1:38 PM  In your  present state of health, do you have any difficulty performing the following activities:  Hearing? 0 0  Vision? 0 0  Difficulty concentrating or making decisions? 1 1  Comment MEMORY   Walking or climbing stairs? 0 0  Dressing or bathing? 0 0  Doing errands, shopping? 0 0  Preparing Food and eating ? N   Using the Toilet? N   In the past six months, have you accidently leaked urine? N   Do you have problems with loss of bowel control? N   Managing your Medications? N   Managing your Finances? N   Housekeeping or managing your Housekeeping? N     Patient Care Team: Shawn Sick, MD as PCP - [facility] as Consulting Physician (Optometry)  I have updated your Care Teams any recent Medical Services you may have received from other providers in the past year.     Assessment:   This is a routine wellness examination for Juan.  Hearing/Vision screen Hearing Screening - Comments:: Denies hearing difficulties.  Vision Screening - Comments:: Wears rx glasses - up to date with routine eye exams with [facility]    Goals Addressed             This Visit's Progress    11/19/23: For the cancer to stay away.         Depression Screen     11/19/2023    2:20 PM 11/19/2023    1:38 PM 11/17/2023   10:28 AM 06/19/2023    4:19 PM 12/23/2022   11:09 AM 12/02/2022   11:44 AM 08/26/2022    2:35 PM  PHQ 2/9 Scores  PHQ - 2 Score 1 0 0 2 3 3  0  PHQ- 9 Score 4   14 13 13    Exception Documentation     --      Fall Risk     11/19/2023    2:18 PM 11/19/2023    1:33 PM 12/23/2022   11:07 AM 12/02/2022    8:19 AM 08/26/2022    2:35 PM  Fall Risk   Falls in the past year? 1 1 0 0 1  Number falls in past yr: 1 1 0 0 1  Injury with Fall? 1 1 0 0 1  Risk for fall due to : History of fall(s);Impaired balance/gait;Orthopedic patient Other (Comment)   Impaired balance/gait;Impaired mobility  Follow up Falls evaluation completed;Falls prevention  discussed Falls evaluation completed;Falls prevention discussed Falls evaluation completed Falls evaluation completed Falls evaluation completed    MEDICARE RISK AT HOME:  Medicare Risk at Home Any stairs in or around the home?: Yes (FRONT & BACK ENTRY, BASEMENT) If so, are there any without handrails?: No Home free of loose throw rugs in walkways, pet beds, electrical cords, etc?: Yes Adequate lighting in your home to reduce risk of falls?: Yes Life alert?: No Use of a cane, walker or w/c?: Yes (CANE) Grab bars in the bathroom?: Yes Shower chair or bench in shower?: Yes Elevated toilet seat or a handicapped toilet?: Yes  TIMED UP AND GO:  Was the test performed?  No  Cognitive Function: Declined/Normal: No cognitive concerns noted by patient or family. Patient alert, oriented, able to answer questions appropriately and recall recent events. No signs of memory loss or confusion.    11/19/2023    2:20 PM  MMSE - Mini Mental State Exam  Not completed: Unable to complete        11/19/2023    2:15 PM 08/26/2022    2:35 PM 06/22/2021    2:18 PM  6CIT Screen  What Year? 0 points 0 points 0 points  What month? 0 points 0 points 0 points  What time? 0 points 0 points 0 points  Count back from 20 0 points 0 points 0 points  Months in reverse 0 points 0 points 0 points  Repeat phrase 0 points 0 points 0 points  Total Score 0 points 0 points 0 points    Immunizations Immunization History  Administered Date(s) Administered   Fluad Quad(high Dose 65+) 12/16/2018   Fluad Trivalent(High Dose 65+) 12/02/2022   Influenza,inj,Quad PF,6+ Mos 12/02/2016, 02/05/2021   PFIZER(Purple Top)SARS-COV-2 Vaccination 06/24/2019, 07/19/2019   PPD Test 01/27/2013, 02/25/2014, 11/17/2015   Pneumococcal Conjugate-13 10/02/2015   Pneumococcal Polysaccharide-23 12/02/2016   Td 05/03/2011   Tdap 06/11/2021    Screening Tests Health Maintenance  Topic Date Due   Zoster Vaccines- Shingrix (1 of 2)  Never done   COVID-19 Vaccine (3 - Pfizer risk series) 08/16/2019   INFLUENZA VACCINE  10/24/2023   Medicare Annual Wellness (AWV)  11/18/2024   DTaP/Tdap/Td (3 - Td or Tdap) 06/12/2031   Pneumococcal Vaccine: 50+ Years  Completed   DEXA SCAN  Completed   Hepatitis C Screening  Completed   HPV VACCINES  Aged Out   Meningococcal B Vaccine  Aged Out   Colonoscopy  Discontinued    Health Maintenance  Health Maintenance Due  Topic Date Due   Zoster Vaccines- Shingrix (1 of 2) Never done   COVID-19 Vaccine (3 - Pfizer risk series) 08/16/2019   INFLUENZA VACCINE  10/24/2023   Health Maintenance Items Addressed: Yes Patient aware of current care gaps.  Patient is due for the following: Covid-19, Shingrix and Flu vaccine.  Additional Screening:  Vision Screening: Recommended annual ophthalmology exams for early detection of glaucoma and other disorders of the eye. Would you like a referral to an eye doctor? No    Dental Screening: Recommended annual dental exams for proper oral hygiene  Community Resource Referral / Chronic Care Management: CRR required this visit?  No   CCM required this visit?  No  Plan:    I have personally reviewed and noted the following in the patient's chart:   Medical and social history Use of alcohol, tobacco or illicit drugs  Current medications and supplements including opioid prescriptions. Patient is not currently taking opioid prescriptions. Functional ability and status Nutritional status Physical activity Advanced directives List of other physicians Hospitalizations, surgeries, and ER visits in previous 12 months Vitals Screenings to include cognitive, depression, and falls Referrals and appointments  In addition, I have reviewed and discussed with patient certain preventive protocols, quality metrics, and best practice recommendations. A written personalized care plan for preventive services as well as general preventive health  recommendations were provided to patient.   Roz LOISE Fuller, LPN   [id number]   After Visit Summary: (MyChart) Due to this being a telephonic visit, the after visit summary with patients personalized plan was offered to patient via MyChart   Notes: None at this time.

## 2023-11-19 NOTE — Patient Instructions (Signed)
 Ms. Debra Rodgers , Thank you for taking time out of your busy schedule to complete your Annual Wellness Visit with me. I enjoyed our conversation and look forward to speaking with you again next year. I, as well as your care team,  appreciate your ongoing commitment to your health goals. Please review the following plan we discussed and let me know if I can assist you in the future. Your Game plan/ To Do List    Referrals: If you haven't heard from the office you've been referred to, please reach out to them at the phone provided.   Follow up Visits: We will see or speak with you next year for your Next Medicare AWV with our clinical staff Have you seen your provider in the last 6 months (3 months if uncontrolled diabetes)? Yes  Clinician Recommendations:  Aim for 30 minutes of exercise or brisk walking, 6-8 glasses of water, and 5 servings of fruits and vegetables each day.       This is a list of the screenings recommended for you:  Health Maintenance  Topic Date Due   Zoster (Shingles) Vaccine (1 of 2) Never done   COVID-19 Vaccine (3 - Pfizer risk series) 08/16/2019   Flu Shot  10/24/2023   Medicare Annual Wellness Visit  11/18/2024   DTaP/Tdap/Td vaccine (3 - Td or Tdap) 06/12/2031   Pneumococcal Vaccine for age over 61  Completed   DEXA scan (bone density measurement)  Completed   Hepatitis C Screening  Completed   HPV Vaccine  Aged Out   Meningitis B Vaccine  Aged Out   Colon Cancer Screening  Discontinued    Advanced directives: (Declined) Advance directive discussed with you today. Even though you declined this today, please call our office should you change your mind, and we can give you the proper paperwork for you to fill out. Advance Care Planning is important because it:  [x]  Makes sure you receive the medical care that is consistent with your values, goals, and preferences  [x]  It provides guidance to your family and loved ones and reduces their decisional burden about  whether or not they are making the right decisions based on your wishes.  Follow the link provided in your after visit summary or read over the paperwork we have mailed to you to help you started getting your Advance Directives in place. If you need assistance in completing these, please reach out to us  so that we can help you!  See attachments for Preventive Care and Fall Prevention Tips.

## 2023-11-20 NOTE — Progress Notes (Signed)
 Internal Medicine Attending:  I reviewed the AWV findings of the medical professional who conducted the visit. I was present in the office suite and immediately available to provide assistance and direction throughout the time the service was provided.

## 2023-11-21 NOTE — Progress Notes (Signed)
 Internal Medicine Clinic Attending  Case discussed with the resident at the time of the visit.  We reviewed the resident's history and exam and pertinent patient test results.  I agree with the assessment, diagnosis, and plan of care documented in the resident's note.

## 2024-02-04 ENCOUNTER — Ambulatory Visit (INDEPENDENT_AMBULATORY_CARE_PROVIDER_SITE_OTHER)

## 2024-02-04 ENCOUNTER — Other Ambulatory Visit: Payer: Self-pay

## 2024-02-04 VITALS — BP 138/75 | HR 72 | Temp 97.8°F | Ht 61.0 in | Wt 144.2 lb

## 2024-02-04 DIAGNOSIS — I10 Essential (primary) hypertension: Secondary | ICD-10-CM | POA: Diagnosis not present

## 2024-02-04 DIAGNOSIS — F331 Major depressive disorder, recurrent, moderate: Secondary | ICD-10-CM | POA: Diagnosis not present

## 2024-02-04 DIAGNOSIS — E876 Hypokalemia: Secondary | ICD-10-CM | POA: Diagnosis not present

## 2024-02-04 DIAGNOSIS — G4733 Obstructive sleep apnea (adult) (pediatric): Secondary | ICD-10-CM

## 2024-02-04 MED ORDER — FLUOXETINE HCL 40 MG PO CAPS: 40.0000 mg | ORAL_CAPSULE | Freq: Every day | ORAL | 2 refills | Status: DC

## 2024-02-04 MED ORDER — CHLORTHALIDONE 25 MG PO TABS: 25.0000 mg | ORAL_TABLET | Freq: Every day | ORAL | 3 refills | Status: AC

## 2024-02-04 NOTE — Assessment & Plan Note (Signed)
 PHQ 12 today from 4 (10/2023), with significant functional impairment. Per pt, appears mostly related no longer working at her previous job that she loved. I encouraged her to find other ways to connect with the geriatric community such as volunteering at a nursing home and she was interested in this idea. No SI/HI.  - increase Prozac  to 40 mg every day  - BH referral placed

## 2024-02-04 NOTE — Assessment & Plan Note (Signed)
 I encouraged her to restart her CPAP use as I believe nocturnal CO2 retention is contributing to her headaches. She is aware of this and agreeable to restarting CPAP for her OSA.

## 2024-02-04 NOTE — Patient Instructions (Signed)
 Thank you for coming in today. If you have any questions or concerns, please feel free to contact me via MyChart or call the office.   I have ordered labs to check on your potassium. I will call you to notify you of the lab results. If your potassium remains low, I will prescribe you a daily potassium supplement.  Have a great day.

## 2024-02-04 NOTE — Progress Notes (Signed)
 Subjective:   Patient ID: Debra Rodgers female   DOB: Nov 23, 1945 78 y.o.   MRN: [id number]  HPI: Debra Rodgers is a 78 y.o. F PMH COPD, OSA previously on CPAP, HTN, Stage IV SCC of lung, MDD who presents for follow up for MDD and HTN.  MDD - PHQ 9 today 12 from 4 in August. 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 This job brought her great meaning and her patients were her closest friends. She has been trying to keep herself busy with baking but she misses her work and this is the main component of her recent depressed mood. She denies any domestic stressors, seasonal relation to her mood, no SI/HI. She is interested in increasing her Prozac  and speaking to a behavioral therapist.   AM headache - mild headache mostly in the morning, she has noticed it more so frequently with self-discontinuation of her CPAP. Using OTC treatments with relief but these episodes frequently resolve and are relatively mild.   HTN - currently on amlodipine  10 mg, recently her chlorthalidone  was decreased from 25 mg to 12.5 mg (unclear documentation, presumably d/t persistent hypokalemia) however her BP is now mildly elevated to 138/75. She was provided brief K supplementation but is not on any chronic K supplementation.   Past Medical History:  Diagnosis Date   AAA (abdominal aortic aneurysm)    COPD (chronic obstructive pulmonary disease) (HCC)    Depression    DVT (deep venous thrombosis) (HCC) 07/12/2019   Encounter for antineoplastic chemotherapy 12/02/2018   Encounter for antineoplastic immunotherapy 12/02/2018   Essential hypertension    Goals of care, counseling/discussion 12/02/2018   MOST form completed 07/12/19:  DNR/DNI but full scope of usual care. Available in [facility]   Headache    History of migraine headaches    lung ca dx'd 10/2018   Sleep apnea    Tobacco use disorder    Current Outpatient Medications  Medication  Sig Dispense Refill   albuterol  (VENTOLIN  HFA) 108 (90 Base) MCG/ACT inhaler Inhale 1-2 puffs into the lungs every 6 (six) hours as needed for wheezing or shortness of breath. 25.5 each 2   amLODipine  (NORVASC ) 10 MG tablet TAKE 1 TABLET EVERY DAY 90 tablet 3   atorvastatin  (LIPITOR) 20 MG tablet TAKE 1 TABLET EVERY DAY 90 tablet 3   chlorthalidone  (HYGROTON ) 25 MG tablet Take 0.5 tablets (12.5 mg total) by mouth daily. 45 tablet 3   famotidine  (PEPCID ) 20 MG tablet Take 20 mg by mouth daily.      FLUoxetine  (PROZAC ) 20 MG capsule TAKE 1 CAPSULE EVERY DAY 90 capsule 3   fluticasone  (FLONASE ) 50 MCG/ACT nasal spray Place 1 spray into both nostrils daily. 16 each 1   ibuprofen  (ADVIL ) 200 MG tablet Take 2 tablets (400 mg total) by mouth daily as needed.     lidocaine -prilocaine  (EMLA ) cream Apply 1 application topically as needed. 30 g 1   polyvinyl alcohol (LIQUIFILM TEARS) 1.4 % ophthalmic solution Place 1 drop into both eyes as needed for dry eyes.     potassium chloride  SA (KLOR-CON  M) 20 MEQ tablet Take 1 tablet (20 mEq total) by mouth daily. 6 tablet 0   rizatriptan (MAXALT) 10 MG tablet Take 0.5 tablets (5 mg total) by mouth as needed for migraine. 10 tablet 0   No current facility-administered medications for this visit.   Family History  Problem Relation Age of  Onset   Hypertension Mother    Stroke Mother    Coronary artery disease Mother    Heart disease Father    Diabetes Sister    Hypertension Sister    Cancer Sister    Breast cancer Sister    Social History   Socioeconomic History   Marital status: Married    Spouse name: Not on file   Number of children: Not on file   Years of education: Not on file   Highest education level: Not on file  Occupational History   Not on file  Tobacco Use   Smoking status: Former    Current packs/day: 0.00    Average packs/day: 1 pack/day for 56.6 years (56.6 ttl pk-yrs)    Types: Cigarettes    Start date: 62    Quit date:  11/04/2018    Years since quitting: 5.2   Smokeless tobacco: Never  Vaping Use   Vaping status: Never Used  Substance and Sexual Activity   Alcohol use: No    Alcohol/week: 0.0 standard drinks of alcohol   Drug use: No   Sexual activity: Never    Partners: Male  Other Topics Concern   Not on file  Social History Narrative   Married, Regular Exercise- yes   Social Drivers of Health   Financial Resource Strain: Low Risk  (11/19/2023)   Overall Financial Resource Strain (CARDIA)    Difficulty of Paying Living Expenses: Not hard at all  Food Insecurity: No Food Insecurity (11/19/2023)   Hunger Vital Sign    Worried About Running Out of Food in the Last Year: Never true    Ran Out of Food in the Last Year: Never true  Transportation Needs: No Transportation Needs (11/19/2023)   PRAPARE - Administrator, Civil Service (Medical): No    Lack of Transportation (Non-Medical): No  Physical Activity: Sufficiently Active (11/19/2023)   Exercise Vital Sign    Days of Exercise per Week: 5 days    Minutes of Exercise per Session: 30 min  Stress: No Stress Concern Present (11/19/2023)   [employer] of Occupational Health - Occupational Stress Questionnaire    Feeling of Stress: Not at all  Social Connections: Moderately Isolated (11/19/2023)   Social Connection and Isolation Panel    Frequency of Communication with Friends and Family: More than three times a week    Frequency of Social Gatherings with Friends and Family: Twice a week    Attends Religious Services: Never    Database Administrator or Organizations: No    Attends Banker Meetings: Never    Marital Status: Married   Review of Systems: Pertinent items are noted in HPI. Objective:   Vitals:   02/04/24 1025  BP: 138/75  Pulse: 72  Temp: 97.8 F (36.6 C)  TempSrc: Oral  SpO2: 97%  Weight: 144 lb 3.2 oz (65.4 kg)  Height: 5' 1 (1.549 m)    Physical Exam: GEN: Well appearing, NAD. Oriented x  3, normal mood and affect  HEENT: Normocephalic, atraumatic, Conjunctiva clear, sclera non-icteric, EOM intact CV: RRR, no M/R/G PULM: CTAB MSK: b/l LE wnl NEURO: moving all extremities spontaneously in upper and lower extremities. PSYCH: Oriented X3, intact recent and remote memory, judgment and insight, normal mood and affect.   Assessment & Plan:   Assessment & Plan Moderate episode of recurrent major depressive disorder (HCC) PHQ 12 today from 4 (10/2023), with significant functional impairment. Per pt, appears mostly related no longer  working at her previous job that she loved. I encouraged her to find other ways to connect with the geriatric community such as volunteering at a nursing home and she was interested in this idea. No SI/HI.  - increase Prozac  to 40 mg every day  - BH referral placed Primary hypertension BP elevated at 138/75. Would prefer to increase her chlorthalidone  back to 25 mg as she had great control on this regimen. 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 We will recheck her BMP today and if still hypokalemic, will start on potassium 20 mEq daily. She is aware of this plan.  - increase chlorthalidone  to 25 mg  Hypokalemia - check BMP OSA (obstructive sleep apnea) I encouraged her to restart her CPAP use as I believe nocturnal CO2 retention is contributing to her headaches. She is aware of this and agreeable to restarting CPAP for her OSA.  Jone Dauphin MD

## 2024-02-05 ENCOUNTER — Ambulatory Visit: Payer: Self-pay

## 2024-02-05 DIAGNOSIS — E876 Hypokalemia: Secondary | ICD-10-CM

## 2024-02-05 LAB — VITAMIN D 25 HYDROXY (VIT D DEFICIENCY, FRACTURES): Vit D, 25-Hydroxy: 8.3 ng/mL — ABNORMAL LOW (ref 30.0–100.0)

## 2024-02-05 LAB — BASIC METABOLIC PANEL WITH GFR
BUN/Creatinine Ratio: 34 — ABNORMAL HIGH (ref 12–28)
BUN: 22 mg/dL (ref 8–27)
CO2: 29 mmol/L (ref 20–29)
Calcium: 9.3 mg/dL (ref 8.7–10.3)
Chloride: 98 mmol/L (ref 96–106)
Creatinine, Ser: 0.64 mg/dL (ref 0.57–1.00)
Glucose: 85 mg/dL (ref 70–99)
Potassium: 3.2 mmol/L — ABNORMAL LOW (ref 3.5–5.2)
Sodium: 140 mmol/L (ref 134–144)
eGFR: 92 mL/min/1.73 (ref >59)

## 2024-02-05 LAB — TSH: TSH: 2.42 u[IU]/mL (ref 0.450–4.500)

## 2024-02-05 MED ORDER — POTASSIUM CHLORIDE CRYS ER 20 MEQ PO TBCR: 20.0000 meq | EXTENDED_RELEASE_TABLET | Freq: Every day | ORAL | 1 refills | Status: DC

## 2024-02-05 MED ORDER — CHOLECALCIFEROL 125 MCG (5000 UT) PO CHEW: 125.0000 ug | CHEWABLE_TABLET | Freq: Every day | ORAL | 1 refills | Status: AC

## 2024-02-10 ENCOUNTER — Telehealth: Payer: Self-pay | Admitting: *Deleted

## 2024-02-10 NOTE — Telephone Encounter (Signed)
 I called [facility] - they do have the rx for Klor-con ; stated it will cost $5.00 and be ready in an hour. I called pt to let her know. Also informed pt Prozac  was increased at LOV; pt stated the doctor did not tell her.

## 2024-02-10 NOTE — Telephone Encounter (Signed)
 Copied from CRM [id number]. Topic: Clinical - Prescription Issue >> Feb 10, 2024 12:15 PM Debra Rodgers wrote: Reason for CRM: Patient states she never received potassium chloride  SA (KLOR-CON  M) 20 MEQ tablet from the [facility] pharmacy (signed on 02/05/2024) when she called them they stated that they have nothing for her.  Patient also states her FLUoxetine  (PROZAC ) 40 MG capsule got increased to 40mg  and would like to know why the dosage increased

## 2024-02-11 DIAGNOSIS — H2513 Age-related nuclear cataract, bilateral: Secondary | ICD-10-CM | POA: Diagnosis not present

## 2024-02-11 DIAGNOSIS — H5203 Hypermetropia, bilateral: Secondary | ICD-10-CM | POA: Diagnosis not present

## 2024-02-11 DIAGNOSIS — H524 Presbyopia: Secondary | ICD-10-CM | POA: Diagnosis not present

## 2024-02-11 DIAGNOSIS — Z135 Encounter for screening for eye and ear disorders: Secondary | ICD-10-CM | POA: Diagnosis not present

## 2024-02-11 DIAGNOSIS — H52223 Regular astigmatism, bilateral: Secondary | ICD-10-CM | POA: Diagnosis not present

## 2024-02-14 ENCOUNTER — Other Ambulatory Visit: Payer: Self-pay | Admitting: Student in an Organized Health Care Education/Training Program

## 2024-02-25 ENCOUNTER — Telehealth: Payer: Self-pay | Admitting: Internal Medicine

## 2024-02-25 ENCOUNTER — Inpatient Hospital Stay: Attending: Internal Medicine

## 2024-02-25 NOTE — Telephone Encounter (Signed)
 Scheduled patient a few upcoming appointments. Called and spoke with the patient discussed the appointments with her and she is aware.

## 2024-03-04 ENCOUNTER — Other Ambulatory Visit: Payer: Self-pay

## 2024-03-04 DIAGNOSIS — E876 Hypokalemia: Secondary | ICD-10-CM

## 2024-04-07 ENCOUNTER — Inpatient Hospital Stay: Attending: Internal Medicine

## 2024-04-17 ENCOUNTER — Other Ambulatory Visit: Payer: Self-pay

## 2024-05-19 ENCOUNTER — Inpatient Hospital Stay

## 2024-05-27 ENCOUNTER — Inpatient Hospital Stay: Admitting: Internal Medicine
